# Patient Record
Sex: Female | Born: 1960 | Race: Black or African American | Hispanic: No | Marital: Married | Smoking: Never smoker
Health system: Southern US, Community
[De-identification: ages and names within clinical notes are randomized; demographics above are authoritative.]

## PROBLEM LIST (undated history)

## (undated) DIAGNOSIS — M2550 Pain in unspecified joint: Secondary | ICD-10-CM

## (undated) DIAGNOSIS — I429 Cardiomyopathy, unspecified: Secondary | ICD-10-CM

## (undated) DIAGNOSIS — K5900 Constipation, unspecified: Secondary | ICD-10-CM

## (undated) DIAGNOSIS — T7840XA Allergy, unspecified, initial encounter: Secondary | ICD-10-CM

## (undated) DIAGNOSIS — K219 Gastro-esophageal reflux disease without esophagitis: Secondary | ICD-10-CM

## (undated) DIAGNOSIS — I509 Heart failure, unspecified: Secondary | ICD-10-CM

## (undated) DIAGNOSIS — G8929 Other chronic pain: Secondary | ICD-10-CM

## (undated) DIAGNOSIS — R079 Chest pain, unspecified: Secondary | ICD-10-CM

## (undated) DIAGNOSIS — D649 Anemia, unspecified: Secondary | ICD-10-CM

## (undated) DIAGNOSIS — F419 Anxiety disorder, unspecified: Secondary | ICD-10-CM

## (undated) DIAGNOSIS — C49A0 Gastrointestinal stromal tumor, unspecified site: Secondary | ICD-10-CM

## (undated) DIAGNOSIS — Z86018 Personal history of other benign neoplasm: Secondary | ICD-10-CM

## (undated) DIAGNOSIS — R202 Paresthesia of skin: Secondary | ICD-10-CM

## (undated) DIAGNOSIS — I10 Essential (primary) hypertension: Secondary | ICD-10-CM

## (undated) DIAGNOSIS — M1990 Unspecified osteoarthritis, unspecified site: Secondary | ICD-10-CM

## (undated) DIAGNOSIS — H5200 Hypermetropia, unspecified eye: Secondary | ICD-10-CM

## (undated) DIAGNOSIS — E785 Hyperlipidemia, unspecified: Secondary | ICD-10-CM

## (undated) DIAGNOSIS — M549 Dorsalgia, unspecified: Secondary | ICD-10-CM

## (undated) DIAGNOSIS — G459 Transient cerebral ischemic attack, unspecified: Secondary | ICD-10-CM

## (undated) HISTORY — PX: LIPOMA EXCISION: SHX5283

## (undated) HISTORY — DX: Gastro-esophageal reflux disease without esophagitis: K21.9

## (undated) HISTORY — DX: Chest pain, unspecified: R07.9

## (undated) HISTORY — PX: UTERINE FIBROID SURGERY: SHX826

## (undated) HISTORY — DX: Anemia, unspecified: D64.9

## (undated) HISTORY — DX: Hyperlipidemia, unspecified: E78.5

## (undated) HISTORY — PX: KNEE ARTHROSCOPY: SUR90

## (undated) HISTORY — DX: Pain in unspecified joint: M25.50

## (undated) HISTORY — PX: ESOPHAGOGASTRODUODENOSCOPY: SHX1529

## (undated) HISTORY — DX: Hypermetropia, unspecified eye: H52.00

## (undated) HISTORY — DX: Essential (primary) hypertension: I10

## (undated) HISTORY — DX: Gastrointestinal stromal tumor, unspecified site: C49.A0

## (undated) HISTORY — DX: Allergy, unspecified, initial encounter: T78.40XA

## (undated) HISTORY — DX: Heart failure, unspecified: I50.9

## (undated) HISTORY — DX: Paresthesia of skin: R20.2

## (undated) HISTORY — PX: UPPER GASTROINTESTINAL ENDOSCOPY: SHX188

## (undated) HISTORY — DX: Personal history of other benign neoplasm: Z86.018

## (undated) HISTORY — DX: Constipation, unspecified: K59.00

## (undated) HISTORY — PX: OTHER SURGICAL HISTORY: SHX169

---

## 2011-01-09 ENCOUNTER — Encounter: Payer: Self-pay | Admitting: Medical

## 2011-01-17 ENCOUNTER — Ambulatory Visit (INDEPENDENT_AMBULATORY_CARE_PROVIDER_SITE_OTHER): Payer: BC Managed Care – PPO | Admitting: Medical

## 2011-01-17 ENCOUNTER — Other Ambulatory Visit (HOSPITAL_COMMUNITY)
Admission: RE | Admit: 2011-01-17 | Discharge: 2011-01-17 | Disposition: A | Discharge status: Home / Self Care | Payer: BC Managed Care – PPO | Source: Ambulatory Visit | Attending: Medical | Admitting: Medical

## 2011-01-17 ENCOUNTER — Encounter: Payer: Self-pay | Admitting: Medical

## 2011-01-17 VITALS — BP 120/70 | HR 68 | Temp 97.8°F | Resp 18 | Ht 69.0 in | Wt 213.0 lb

## 2011-01-17 DIAGNOSIS — Z01419 Encounter for gynecological examination (general) (routine) without abnormal findings: Secondary | ICD-10-CM | POA: Insufficient documentation

## 2011-01-17 DIAGNOSIS — Z124 Encounter for screening for malignant neoplasm of cervix: Secondary | ICD-10-CM

## 2011-01-17 DIAGNOSIS — Z1231 Encounter for screening mammogram for malignant neoplasm of breast: Secondary | ICD-10-CM

## 2011-01-17 DIAGNOSIS — M549 Dorsalgia, unspecified: Secondary | ICD-10-CM

## 2011-01-17 DIAGNOSIS — Z23 Encounter for immunization: Secondary | ICD-10-CM

## 2011-01-17 DIAGNOSIS — I10 Essential (primary) hypertension: Secondary | ICD-10-CM | POA: Insufficient documentation

## 2011-01-17 DIAGNOSIS — D649 Anemia, unspecified: Secondary | ICD-10-CM

## 2011-01-17 DIAGNOSIS — Z0000 Encounter for general adult medical examination without abnormal findings: Secondary | ICD-10-CM

## 2011-01-17 LAB — CBC WITH DIFFERENTIAL/PLATELET
Basophils Absolute: 0 10*3/uL (ref 0.0–0.1)
Basophils Relative: 0 % (ref 0–1)
Eosinophils Absolute: 0.2 10*3/uL (ref 0.0–0.7)
Eosinophils Relative: 2 % (ref 0–5)
HCT: 38.9 % (ref 36.0–46.0)
Hemoglobin: 12.5 g/dL (ref 12.0–15.0)
Lymphocytes Relative: 35 % (ref 12–46)
Lymphs Abs: 2.9 10*3/uL (ref 0.7–4.0)
MCH: 27.4 pg (ref 26.0–34.0)
MCHC: 32.1 g/dL (ref 30.0–36.0)
MCV: 85.3 fL (ref 78.0–100.0)
Monocytes Absolute: 0.6 10*3/uL (ref 0.1–1.0)
Monocytes Relative: 7 % (ref 3–12)
Neutro Abs: 4.6 10*3/uL (ref 1.7–7.7)
Neutrophils Relative %: 55 % (ref 43–77)
Platelets: 351 10*3/uL (ref 150–400)
RBC: 4.56 MIL/uL (ref 3.87–5.11)
RDW: 14.3 % (ref 11.5–15.5)
WBC: 8.4 10*3/uL (ref 4.0–10.5)

## 2011-01-17 LAB — POCT URINALYSIS DIPSTICK
Bilirubin, UA: NEGATIVE
Blood, UA: NEGATIVE
Glucose, UA: NEGATIVE
Ketones, UA: NEGATIVE
Leukocytes, UA: NEGATIVE
Nitrite, UA: NEGATIVE
Spec Grav, UA: 1.01
Urobilinogen, UA: NEGATIVE
pH, UA: 7

## 2011-01-17 LAB — FERRITIN: Ferritin: 123 ng/mL (ref 10–291)

## 2011-01-17 LAB — TSH: TSH: 1.891 u[IU]/mL (ref 0.350–4.500)

## 2011-01-17 MED ORDER — NABUMETONE 750 MG PO TABS: 750.0000 mg | ORAL_TABLET | Freq: Two times a day (BID) | ORAL | Status: DC

## 2011-01-17 MED ORDER — OMEPRAZOLE 20 MG PO CPDR: DELAYED_RELEASE_CAPSULE | ORAL | Status: DC

## 2011-01-17 MED ORDER — FERROUS SULFATE 220 (44 FE) MG/5ML PO ELIX: 220.0000 mg | ORAL_SOLUTION | Freq: Every day | ORAL | Status: DC

## 2011-01-17 MED ORDER — NAPROXEN SODIUM 550 MG PO TABS: 550.0000 mg | ORAL_TABLET | Freq: Two times a day (BID) | ORAL | Status: DC

## 2011-01-17 MED ORDER — HYDROCHLOROTHIAZIDE 12.5 MG PO CAPS: 12.5000 mg | ORAL_CAPSULE | Freq: Every day | ORAL | Status: DC

## 2011-01-17 MED ORDER — LORATADINE 10 MG PO TABS: 10.0000 mg | ORAL_TABLET | Freq: Every day | ORAL | Status: DC

## 2011-01-17 NOTE — Progress Notes (Signed)
Addended by: Jac Canavan on: 01/17/2011 02:32 PM   Modules accepted: Orders

## 2011-01-17 NOTE — Patient Instructions (Signed)

## 2011-01-17 NOTE — Progress Notes (Signed)
Subjective:   HPI  Lisa Briggs is a 50 y.o. female who presents for a complete physical.  She is here as a new patient today.   She is due for mammogram, pap smear, and needs medications refilled.  Last medical care was over a year ago when she lived in [place], [place].  She moved here to [place] in 6/11 with her husband to take care of his mother who is ailing.    Her main c/o is back pain.  She notes ongoing left low back pain for months.  At times the pain radiates down in to the left buttock.  Gets shooting pains and tingling all the way down to the foot at times.   Using Relafen on average daily, and uses Naprosyn 1-2 times daily for pain which helps some.  No other aggravating or relieving factors.  No prior imaging or therapy otherwise.   She notes hx/o anemia.  Been on iron therapy for years.   No prior colonoscopy or EGD.    She wears glasses and has seen eye doctor within the year.   Saw dentist recently, had some teeth pulled.  Has f/u planned.   Reviewed their medical, surgical, family, social, medication, and allergy history and updated chart as appropriate.  Past Medical History  Diagnosis Date  . Hypertension   . GERD (gastroesophageal reflux disease)   . Allergy   . Farsightedness     wears glasses, Eye care center  . Anemia     iron therapy for years as of 10/12  . History of uterine fibroid     Past Surgical History  Procedure Date  . Abdominal hysterectomy     partial for uterine fibroids  . Lipoma excision     forehead  . Knee arthroscopy     left    Family History  Problem Relation Age of Onset  . Hypertension Mother   . Arthritis Mother   . GER disease Mother   . Stroke Father   . Alcohol abuse Father   . Cancer Sister     breast cancer dx late 65s; another sister with leukemia  . Hypertension Sister   . Diabetes Brother   . Diabetes Maternal Aunt     History   Social History  . Marital Status: Married    Spouse Name: N/A    Number of  Children: N/A  . Years of Education: N/A   Occupational History  . Location manager    Social History Main Topics  . Smoking status: Never Smoker   . Smokeless tobacco: Never Used  . Alcohol Use: No  . Drug Use: No  . Sexually Active: Not on file   Other Topics Concern  . Not on file   Social History Narrative   Married, 1 child, lives at home.  Location manager.  Active on job, but no other exercise currently.     No Known Allergies   Review of Systems Constitutional: denies fever, chills, sweats, unexpected weight change, anorexia, fatigue Allergy: +occasional congestion, runny nose; denies recent sneezing, itching Dermatology: denies changing moles, rash, lumps, new worrisome lesions ENT: +occasional sinus problems; no runny nose, ear pain, sore throat, hoarseness, tinnitus, hearing loss, epistaxis Cardiology: denies chest pain, palpitations, edema, orthopnea, paroxysmal nocturnal dyspnea Respiratory: denies cough, shortness of breath, dyspnea on exertion, wheezing, hemoptysis Gastroenterology: denies abdominal pain, nausea, vomiting, diarrhea, constipation, blood in stool, changes in bowel movement, dysphagia Hematology: denies bleeding or bruising problems Musculoskeletal: +back pain, left buttock pain,  shooting pains to foot on the left, some left hip pain intermittent; denies joint swelling, neck pain, cramping Ophthalmology: denies vision changes, eye redness, itching, discharge Urology: denies dysuria, difficulty urinating, hematuria, urinary frequency, urgency, incontinence Neurology: +tingling in left leg from back pain; no headache, weakness, numbness, speech abnormality, memory loss, falls, dizziness Psychology: denies depressed mood, agitation, sleep problems     Objective:   Physical Exam  Filed Vitals:   01/17/11 0833  BP: 120/70  Pulse: 68  Temp: 97.8 F (36.6 C)  Resp: 18    General appearance: alert, no distress, WD/WN, black female Skin: few  scattered benign appearing macules, no worrisome lesions HEENT: normocephalic, conjunctiva/corneas normal, sclerae anicteric, PERRLA, EOMi, nares patent, no discharge or erythema, pharynx normal Oral cavity: MMM, tongue normal, teeth in good repair Neck: supple, no lymphadenopathy, no thyromegaly, no masses, normal ROM, no bruits Chest: non tender, normal shape and expansion Heart: RRR, normal S1, S2, no murmurs Lungs: CTA bilaterally, no wheezes, rhonchi, or rales Abdomen: +bs, soft, non tender, non distended, no masses, no hepatomegaly, no splenomegaly, no bruits Back: mild midline lumbar tenderness, pain with back flexion and extension, flexion to 90 degrees, extension to 5 degrees, otherwise non tender, no scoliosis Musculoskeletal: tender along left hip generalized, mild tenderness over left trochanteric bursa, otherwise upper extremities non tender, no obvious deformity, normal ROM throughout, lower extremities non tender, no obvious deformity, normal ROM throughout Extremities: no edema, no cyanosis, no clubbing Pulses: 2+ symmetric, upper and lower extremities, normal cap refill Neurological: +SLR on left at 20 degrees, otherwise alert, oriented x 3, CN2-12 intact, strength normal upper extremities and lower extremities, sensation normal throughout, DTRs 2+ throughout, no cerebellar signs, gait normal Psychiatric: normal affect, behavior normal, pleasant  Breasts: nontender, no nodules, no mass, no lymphadenopathy, no skin changes. Gyn: normal external genitalia, no discharge, cervix seems somewhat closed, but no lesions otherwise, no adnexal mass or tenderness, swabs taken, anus normal appearing, exam chaperoned by nurse  Assessment :    Encounter Diagnoses  Name Primary?  . Routine general medical examination at a health care facility Yes  . Essential hypertension, benign   . Anemia   . Back pain   . Visit for screening mammogram   . Screening for cervical cancer   . Need for  tetanus booster     Plan:    Physical exam - discussed healthy lifestyle, diet, exercise, preventative care, vaccinations, and addressed their concerns.  Labs today, handout given.   HTN - controlled on current medication.  Refills today.  Anemia - labs today, c/t iron.  Back pain - pending labs, consider PT, and baseline xrays.  Will send for screening mammogram and pap smear sent.   Tdap and VIS given today.

## 2011-01-18 LAB — COMPREHENSIVE METABOLIC PANEL
ALT: 15 U/L (ref 0–35)
AST: 15 U/L (ref 0–37)
Albumin: 4 g/dL (ref 3.5–5.2)
Alkaline Phosphatase: 86 U/L (ref 39–117)
BUN: 18 mg/dL (ref 6–23)
CO2: 31 mEq/L (ref 19–32)
Calcium: 9.3 mg/dL (ref 8.4–10.5)
Chloride: 102 mEq/L (ref 96–112)
Creat: 0.64 mg/dL (ref 0.50–1.10)
Glucose, Bld: 80 mg/dL (ref 70–99)
Potassium: 3.8 mEq/L (ref 3.5–5.3)
Sodium: 141 mEq/L (ref 135–145)
Total Bilirubin: 0.3 mg/dL (ref 0.3–1.2)
Total Protein: 7 g/dL (ref 6.0–8.3)

## 2011-01-18 LAB — LIPID PANEL
Cholesterol: 173 mg/dL (ref 0–200)
HDL: 61 mg/dL (ref >39)
LDL Cholesterol: 101 mg/dL — ABNORMAL HIGH (ref 0–99)
Total CHOL/HDL Ratio: 2.8 Ratio
Triglycerides: 55 mg/dL (ref <150)
VLDL: 11 mg/dL (ref 0–40)

## 2011-01-18 LAB — IBC PANEL
%SAT: 17 % — ABNORMAL LOW (ref 20–55)
TIBC: 266 ug/dL (ref 250–470)
UIBC: 221 ug/dL (ref 125–400)

## 2011-01-18 LAB — IRON: Iron: 45 ug/dL (ref 42–145)

## 2011-01-23 NOTE — Progress Notes (Signed)
Patient was notified of pap results. CLS

## 2011-01-26 ENCOUNTER — Other Ambulatory Visit: Payer: Self-pay | Admitting: Family Medicine

## 2011-01-27 ENCOUNTER — Other Ambulatory Visit: Payer: Self-pay | Admitting: Medical

## 2011-01-27 ENCOUNTER — Telehealth: Payer: Self-pay | Admitting: Medical

## 2011-01-27 NOTE — Telephone Encounter (Signed)
Lisa Briggs, this patient states that the BP medication that was sent into her pharmacy was wrong and that she need another rx as well. CLS

## 2011-01-27 NOTE — Telephone Encounter (Signed)
i refilled what was listed by nurse.  Pls call pt and verify what medication, dose, and frequency she is taking so I can send the appropriate medication.

## 2011-02-09 ENCOUNTER — Ambulatory Visit
Admission: RE | Admit: 2011-02-09 | Discharge: 2011-02-09 | Disposition: A | Discharge status: Home / Self Care | Payer: BC Managed Care – PPO | Source: Ambulatory Visit | Attending: Medical | Admitting: Medical

## 2011-02-09 DIAGNOSIS — Z1231 Encounter for screening mammogram for malignant neoplasm of breast: Secondary | ICD-10-CM

## 2011-02-28 ENCOUNTER — Telehealth: Payer: Self-pay | Admitting: Medical

## 2011-02-28 ENCOUNTER — Other Ambulatory Visit: Payer: Self-pay | Admitting: Medical

## 2011-02-28 MED ORDER — ONDANSETRON HCL 4 MG PO TABS: 4.0000 mg | ORAL_TABLET | Freq: Every day | ORAL | Status: DC | PRN

## 2011-02-28 NOTE — Telephone Encounter (Signed)
Make sure she is taking her Omeprazole.  If this isn't helping, then need to recheck on this.  I will send some Zofran for nausea.

## 2011-03-02 NOTE — Telephone Encounter (Signed)
PATIENT WAS NOTIFIED AND SHE STATES THE MEDICATION IS NOT WORKING ANYMORE. SHE SCHEDULED AN APPT. FOR Friday. CLS  DID YOU SEND HER ZOFRAN TO THE PHARMACY. CLS

## 2011-03-03 ENCOUNTER — Ambulatory Visit: Payer: BC Managed Care – PPO | Admitting: Medical

## 2011-03-06 ENCOUNTER — Ambulatory Visit (INDEPENDENT_AMBULATORY_CARE_PROVIDER_SITE_OTHER): Payer: BC Managed Care – PPO | Admitting: Medical

## 2011-03-06 ENCOUNTER — Encounter: Payer: Self-pay | Admitting: Medical

## 2011-03-06 DIAGNOSIS — K5900 Constipation, unspecified: Secondary | ICD-10-CM | POA: Insufficient documentation

## 2011-03-06 DIAGNOSIS — R079 Chest pain, unspecified: Secondary | ICD-10-CM | POA: Insufficient documentation

## 2011-03-06 DIAGNOSIS — R109 Unspecified abdominal pain: Secondary | ICD-10-CM | POA: Insufficient documentation

## 2011-03-06 DIAGNOSIS — R001 Bradycardia, unspecified: Secondary | ICD-10-CM | POA: Insufficient documentation

## 2011-03-06 DIAGNOSIS — R110 Nausea: Secondary | ICD-10-CM

## 2011-03-06 DIAGNOSIS — I498 Other specified cardiac arrhythmias: Secondary | ICD-10-CM

## 2011-03-06 LAB — POCT URINALYSIS DIPSTICK
Bilirubin, UA: NEGATIVE
Glucose, UA: NEGATIVE
Ketones, UA: NEGATIVE
Leukocytes, UA: NEGATIVE
Nitrite, UA: NEGATIVE
Protein, UA: NEGATIVE
Spec Grav, UA: 1.025
Urobilinogen, UA: NEGATIVE
pH, UA: 7

## 2011-03-06 MED ORDER — POLYETHYLENE GLYCOL 3350 17 GM/SCOOP PO POWD: 17.0000 g | Freq: Every day | ORAL | Status: AC

## 2011-03-06 MED ORDER — ONDANSETRON HCL 4 MG PO TABS: 4.0000 mg | ORAL_TABLET | Freq: Every day | ORAL | Status: DC | PRN

## 2011-03-06 NOTE — Progress Notes (Signed)
Subjective:   HPI  Lisa Briggs is a 50 y.o. female who presents with some nausea, abdominal pain and chest pain.  This past weekend started getting stomach pain and vomiting.  Symptoms began a week ago.  Been having nausea all week.  Had some belly pain in middle of abdomen.  The pains are sharp.  Decreased appetite.  Son has had nasal drainage, and she notes that her right nostril stays congested and draining.   She notes some chest discomfort intermittent in the last week that lasts for 5 minutes or less.   She was at work when one episode happened, on another occasion though it happened at home while sitting on a sofa.  The chest pain was aggravated by nausea some.  Denies sweats, edema, worsening fatigue.  She does have hx/o heart disease in family including sisters.  She denies prior cardiac evaluation.  Of note, she is not exercising much.  No other aggravating or relieving factors.    No other c/o.  The following portions of the patient's history were reviewed and updated as appropriate: allergies, current medications, past family history, past medical history, past social history, past surgical history and problem list.  Review of Systems Constitutional: -fever, -chills, -sweats, +unexpected -weight change,-fatigue ENT: +runny nose, -ear pain, -sore throat Cardiology:  +chest pain, -palpitations, -edema Respiratory: -cough, -shortness of breath, -wheezing Gastroenterology: -abdominal pain, -nausea, -vomiting, -diarrhea, +constipation Hematology: -bleeding or bruising problems Musculoskeletal: -arthralgias, -myalgias, -joint swelling, +back pain Ophthalmology: +vision changes Urology: -dysuria, -difficulty urinating, -hematuria, -urinary frequency, -urgency Neurology: -headache, -weakness, +tingling, -numbness   Adult ECG Report  Indication: chest pain  Rate: 51 bpm  Rhythm: sinus bradycardia  QRS Axis: 43 degrees  PR Interval:   QRS Duration: 96 ms    QTc: 449 ms  Conduction Disturbances: none  Other Abnormalities: T wave inversions V1, V2  Patient's cardiac risk factors are: family history of premature cardiovascular disease, hypertension and sedentary lifestyle.  EKG comparison: none  Narrative Interpretation: sinus bradycardia, will refer to cardiology for evaluation of chest.     Objective:   Physical Exam  Filed Vitals:   03/06/11 0827  BP: 112/70  Pulse: 62  Temp: 98.4 F (36.9 C)  Resp: 16    General appearance: alert, no distress, WD/WN HEENT: normocephalic, sclerae anicteric, TMs pearly, nares patent, no discharge or erythema, pharynx normal Oral cavity: MMM, no lesions Neck: supple, no lymphadenopathy, no thyromegaly, no masses, no bruits Heart: RRR, normal S1, S2, no murmurs Lungs: CTA bilaterally, no wheezes, rhonchi, or rales Abdomen: +bs, soft, non tender, non distended, no masses, no hepatomegaly, no splenomegaly Back : no CVA tenderness Pulses: 2+ symmetric, upper and lower extremities, normal cap refill   Assessment and Plan :    Encounter Diagnoses  Name Primary?  . Abdominal pain Yes  . Chest pain   . Nausea   . Constipation   . Bradycardia    Abdominal pain and nausea - nonspecific but could be related to constipation.   I reviewed her recent labs from her physical with me.   Begin trial of Miralax daily, rest, hydrate well with water.  Begin Zofran prn for nausea.  Given EKG findings today of bradycardia and some T wave inversions, and  Given her risk factors - family hx/o heart disease, personal hx/o HTN, and current symptoms, will refer to cardiology for evaluation.  Follow-up prn or worsening symptoms.

## 2011-03-20 ENCOUNTER — Telehealth: Payer: Self-pay | Admitting: Internal Medicine

## 2011-03-20 MED ORDER — HYDROCHLOROTHIAZIDE 12.5 MG PO CAPS: 12.5000 mg | ORAL_CAPSULE | Freq: Every day | ORAL | Status: DC

## 2011-03-20 NOTE — Telephone Encounter (Signed)
Med sent in.

## 2011-03-30 ENCOUNTER — Telehealth: Payer: Self-pay | Admitting: Family Medicine

## 2011-03-30 MED ORDER — HYDROCHLOROTHIAZIDE 25 MG PO TABS: 25.0000 mg | ORAL_TABLET | Freq: Every day | ORAL | Status: DC

## 2011-03-30 NOTE — Telephone Encounter (Signed)
Refilled med with 25 mg hctz

## 2011-03-30 NOTE — Telephone Encounter (Signed)
Pt called and said she got the wrong refill she wanted the HCTZ 25 mg to [facility] [address].

## 2011-04-04 HISTORY — PX: ESOPHAGOGASTRODUODENOSCOPY: SHX1529

## 2011-04-05 DIAGNOSIS — R079 Chest pain, unspecified: Secondary | ICD-10-CM

## 2011-04-05 HISTORY — DX: Chest pain, unspecified: R07.9

## 2011-04-06 ENCOUNTER — Telehealth: Payer: Self-pay | Admitting: Internal Medicine

## 2011-04-06 NOTE — Telephone Encounter (Signed)
This is your patient.

## 2011-04-07 ENCOUNTER — Other Ambulatory Visit: Payer: Self-pay | Admitting: Medical

## 2011-04-07 DIAGNOSIS — R110 Nausea: Secondary | ICD-10-CM

## 2011-04-07 MED ORDER — HYDROCHLOROTHIAZIDE 25 MG PO TABS: 25.0000 mg | ORAL_TABLET | Freq: Every day | ORAL | Status: DC

## 2011-04-07 MED ORDER — LORATADINE 10 MG PO TABS: 10.0000 mg | ORAL_TABLET | Freq: Every day | ORAL | Status: DC

## 2011-04-07 MED ORDER — NAPROXEN SODIUM 550 MG PO TABS: 550.0000 mg | ORAL_TABLET | Freq: Two times a day (BID) | ORAL | Status: DC

## 2011-04-07 NOTE — Telephone Encounter (Signed)
I refilled Hydrochlorothiazide and Loratidine.  I didn't refill the zofran, nabumetone and naproxen though.  1) she shouldn't be taking Zofran regularly.  And 2), she shouldn't be on both Nabumetone and Naproxen at the same time.    If still nauseated on a routine basis, then recheck.  Does she feel the need to continue to be on an arthritis/pain pill?  If so, which does she feels works better, Nabumetone or Naproxen?

## 2011-04-07 NOTE — Telephone Encounter (Signed)
PATIENTS STATES THAT SHE IS ONLY TAKING NAPROXEN. SHE SAID THAT SHE IS USING THE ZOFRAN BECAUSE SHE GETS NAUSED WHEN HER SINUS DRAIN. SHE WANTS A REFILL ON THE NAPROXEN. CLS

## 2011-04-10 ENCOUNTER — Telehealth: Payer: Self-pay | Admitting: Family Medicine

## 2011-04-10 NOTE — Telephone Encounter (Signed)
Message copied by Janeice Robinson on Mon Apr 10, 2011  2:29 PM ------      Message from: [place], [place] S      Created: Fri Apr 07, 2011  9:26 PM       pls call [facility] and verify they got my refill orders for hydrochlorothiazide, loratadine, and naproxen.  Address/contact info in E chart different than paper request.

## 2011-04-10 NOTE — Telephone Encounter (Signed)
SHANE I CALLED [facility] AND THEY DID RECEIVE HER MEDICATIONS AND THEY WILL BE SHIP OUT TO HER IN THE NEXT DAY OR SO. CLS

## 2011-05-08 ENCOUNTER — Other Ambulatory Visit: Payer: Self-pay | Admitting: Medical

## 2011-05-08 ENCOUNTER — Telehealth: Payer: Self-pay | Admitting: Internal Medicine

## 2011-05-08 MED ORDER — OMEPRAZOLE 20 MG PO CPDR: DELAYED_RELEASE_CAPSULE | ORAL | Status: DC

## 2011-05-08 MED ORDER — HYDROCHLOROTHIAZIDE 25 MG PO TABS: 25.0000 mg | ORAL_TABLET | Freq: Every day | ORAL | Status: DC

## 2011-05-08 MED ORDER — FERROUS SULFATE 220 (44 FE) MG/5ML PO ELIX: 220.0000 mg | ORAL_SOLUTION | Freq: Every day | ORAL | Status: DC

## 2011-05-08 NOTE — Telephone Encounter (Signed)
RX REFILLS WAS S ENT TO [facility]. CLS

## 2011-05-08 NOTE — Telephone Encounter (Signed)
error 

## 2011-05-10 ENCOUNTER — Telehealth: Payer: Self-pay | Admitting: Family Medicine

## 2011-05-11 ENCOUNTER — Other Ambulatory Visit: Payer: Self-pay | Admitting: Medical

## 2011-05-11 MED ORDER — POLYETHYLENE GLYCOL 3350 17 GM/SCOOP PO POWD: 17.0000 g | Freq: Every day | ORAL | Status: AC

## 2011-05-11 NOTE — Telephone Encounter (Signed)
There were discrepancies last time I had refill request. Pls call and verify ALL medications and update chart.  1) we refilled Naproxen recently and advised to stop the Nabumetone. 2) chart shows Prilosec.  So is she on Prilosec or Protonix? 3) Pls call out 90 day supply of Polyethylene Glycol/Miralx to pharmacy, 1 cap full daily.  The electronic order doesn't let you choose 90 day so call this one in.

## 2011-05-12 ENCOUNTER — Other Ambulatory Visit: Payer: Self-pay | Admitting: Medical

## 2011-05-12 MED ORDER — OMEPRAZOLE 20 MG PO CPDR: DELAYED_RELEASE_CAPSULE | ORAL | Status: DC

## 2011-05-12 NOTE — Telephone Encounter (Signed)
Patient states that she takes the nabumetone as needed. She said that Dr. Donnie Aho said she can take Prilosec and proto nix but not at the same time. CLS

## 2011-05-16 ENCOUNTER — Other Ambulatory Visit: Payer: Self-pay | Admitting: Medical

## 2011-05-16 ENCOUNTER — Telehealth: Payer: Self-pay | Admitting: Medical

## 2011-05-16 MED ORDER — HYDROCHLOROTHIAZIDE 25 MG PO TABS: 25.0000 mg | ORAL_TABLET | Freq: Every day | ORAL | Status: DC

## 2011-05-16 NOTE — Telephone Encounter (Signed)
ALSO LOOKS LIKE WE HAVE REFILLED A LIQUID IRON AND SHE STATES SHE TAKES AN IRON PILL

## 2011-05-17 NOTE — Telephone Encounter (Signed)
SHANE I SPOKE WITH THE [facility] AND THEY SAID POLYETHYLENE GLYCOL/MIRALX  NOR IS FERROUS SULFATE ARE NOT COVERED UNDER HER PLAN BUT HEY DO HAVE THE RX. HER HCTZ THEY HAVE THE RX BUT TO EARLY TO FILL SHE WILL GET THIS ON 06/13/11. THE NAPROXEN SODIUM FOR A 90 DAY SUPPLY WAS SENT OUT ON 04/21/11. OMEPRAZOLE WAS SENT ALREADY. THEY SAID THEY NEED A NEW RX FOR THE NABUMETONE 750MG  NOR DO THEY A HAVE A RX REFILL FOR PROTONIX 40MG . PATIENT SAID SHE ONLY USES THE NABUMETONE AS NEEDED. CLS

## 2011-05-17 NOTE — Telephone Encounter (Signed)
FYI - There have been a lot of back and forth calls about this patient's medications recently.  I recommend she come in for med check/med review.  Nevertheless,   Pls call pt about the following: We called [facility] to sort through the issues as we keep getting refill requests and confusion.    Here is what we know: 1) [facility] did receive refills on Miralax, iron, HCTZ, Naproxen, and Omeprazole.  Thus, we sent them as requested. 2) [facility] said her insurance will not cover Miralax/polyethylene glycol as it is available OTC.   3) [facility] said her insurance won't cover the ferrous iron liquid.  If she was on tablets before, pls verify dose - 325 mg BID?  I'll be glad to resubmit this. 4) [facility] said her HCTZ/hydrochlorothiazide is not ready to refill until 06/13/11.  They have the script on file and ready though.  5) As I mentioned before, I don't think it is appropriate for her to be on 2 NSAIDs - Naproxen and Nabumetone simultaneously.  Thus, I am not going to refill Nabumetone.   She can return and we can discuss which works better for her though.  6) likewise, I don't think its aappropriatefor her to be on 2 PPIs ssimultaneously Protonix and OOmeprazole. She has refills of Omeprazole  I am not going to refill Protonix.    Thus, she is due for med check/recheck between now and next month or so.   But if desired, can come in to discuss.   Please let her know that we have tried to accommodate these requests, but this is the info i have at this time.

## 2011-05-17 NOTE — Telephone Encounter (Signed)
Done

## 2011-05-17 NOTE — Telephone Encounter (Signed)
pls call [facility] and verify what meds were sent, when and quantity.  I will need to verify this before i do anything else.

## 2011-05-23 ENCOUNTER — Other Ambulatory Visit: Payer: Self-pay | Admitting: Medical

## 2011-05-23 ENCOUNTER — Ambulatory Visit: Payer: BC Managed Care – PPO | Attending: Family Medicine | Admitting: Rehabilitation

## 2011-05-23 DIAGNOSIS — M2550 Pain in unspecified joint: Secondary | ICD-10-CM | POA: Insufficient documentation

## 2011-05-23 DIAGNOSIS — M2560 Stiffness of unspecified joint, not elsewhere classified: Secondary | ICD-10-CM | POA: Insufficient documentation

## 2011-05-23 DIAGNOSIS — IMO0001 Reserved for inherently not codable concepts without codable children: Secondary | ICD-10-CM | POA: Insufficient documentation

## 2011-05-23 MED ORDER — POLYETHYLENE GLYCOL 3350 17 GM/SCOOP PO POWD: 17.0000 g | Freq: Every day | ORAL | Status: DC

## 2011-05-23 NOTE — Telephone Encounter (Signed)
I WENT OVER EVERYTHING WITH THIS PATIENT ABOUT HER MEDICATIONS. SHE STATES THAT HER FERROUS IRON IS 325 MG PILL FORM SO IF YOU COULD SEND THAT TO [facility]. SHE ALSO ASK IF YOU COULD SEND THE MIRLAX/POLOEYTHLINE GLYCO POWDER TO [facility] THAT IS IN HER CHART. I WENT BACK OVER THE LIST OF MEDICATIONS AGAIN BEFORE GETTING OF THE PHONE NAPROXEN, PROTONIX, HCTZ, IRON PILL AND THE MIRALAX BY RX. SHE AGREED THAT THESE ARE HER MEDICATIONS. CLS

## 2011-05-23 NOTE — Telephone Encounter (Signed)
i sent the miralax to [facility] [address].  [facility]

## 2011-05-25 ENCOUNTER — Ambulatory Visit: Payer: BC Managed Care – PPO | Admitting: Rehabilitation

## 2011-05-31 ENCOUNTER — Ambulatory Visit: Payer: BC Managed Care – PPO | Admitting: Physical Therapy

## 2011-06-02 ENCOUNTER — Encounter: Payer: BC Managed Care – PPO | Admitting: Rehabilitation

## 2011-06-05 ENCOUNTER — Encounter: Payer: BC Managed Care – PPO | Admitting: Rehabilitation

## 2011-06-06 ENCOUNTER — Ambulatory Visit: Payer: BC Managed Care – PPO | Attending: Family Medicine | Admitting: Rehabilitation

## 2011-06-06 ENCOUNTER — Encounter: Payer: BC Managed Care – PPO | Admitting: Rehabilitation

## 2011-06-06 DIAGNOSIS — M2560 Stiffness of unspecified joint, not elsewhere classified: Secondary | ICD-10-CM | POA: Insufficient documentation

## 2011-06-06 DIAGNOSIS — IMO0001 Reserved for inherently not codable concepts without codable children: Secondary | ICD-10-CM | POA: Insufficient documentation

## 2011-06-06 DIAGNOSIS — M2550 Pain in unspecified joint: Secondary | ICD-10-CM | POA: Insufficient documentation

## 2011-06-07 ENCOUNTER — Encounter: Payer: BC Managed Care – PPO | Admitting: Rehabilitation

## 2011-06-07 ENCOUNTER — Ambulatory Visit: Payer: BC Managed Care – PPO | Admitting: Rehabilitation

## 2011-06-12 ENCOUNTER — Encounter: Payer: BC Managed Care – PPO | Admitting: Rehabilitation

## 2011-06-14 ENCOUNTER — Ambulatory Visit: Payer: BC Managed Care – PPO | Admitting: Physical Therapy

## 2011-06-19 ENCOUNTER — Encounter: Payer: BC Managed Care – PPO | Admitting: Rehabilitation

## 2011-06-21 ENCOUNTER — Encounter: Payer: BC Managed Care – PPO | Admitting: Rehabilitation

## 2011-06-26 ENCOUNTER — Encounter: Payer: BC Managed Care – PPO | Admitting: Physical Therapy

## 2011-06-27 ENCOUNTER — Ambulatory Visit: Payer: BC Managed Care – PPO | Admitting: Physical Therapy

## 2011-06-28 ENCOUNTER — Encounter: Payer: BC Managed Care – PPO | Admitting: Physical Therapy

## 2011-06-29 ENCOUNTER — Encounter: Payer: BC Managed Care – PPO | Admitting: Physical Therapy

## 2011-06-30 ENCOUNTER — Encounter: Payer: BC Managed Care – PPO | Admitting: Physical Therapy

## 2011-07-03 ENCOUNTER — Encounter: Payer: Self-pay | Admitting: Medical

## 2011-07-03 ENCOUNTER — Ambulatory Visit (INDEPENDENT_AMBULATORY_CARE_PROVIDER_SITE_OTHER): Payer: BC Managed Care – PPO | Admitting: Medical

## 2011-07-03 VITALS — BP 110/70 | HR 80 | Temp 97.7°F | Resp 14 | Wt 217.0 lb

## 2011-07-03 DIAGNOSIS — J329 Chronic sinusitis, unspecified: Secondary | ICD-10-CM

## 2011-07-03 DIAGNOSIS — D509 Iron deficiency anemia, unspecified: Secondary | ICD-10-CM

## 2011-07-03 MED ORDER — AMOXICILLIN 875 MG PO TABS: 875.0000 mg | ORAL_TABLET | Freq: Two times a day (BID) | ORAL | Status: AC

## 2011-07-03 MED ORDER — FERROUS GLUCONATE 325 (36 FE) MG PO TABS: 1.0000 | ORAL_TABLET | Freq: Two times a day (BID) | ORAL | Status: DC

## 2011-07-03 MED ORDER — FLUTICASONE PROPIONATE 50 MCG/ACT NA SUSP: NASAL | Status: DC

## 2011-07-03 NOTE — Progress Notes (Signed)
Subjective:  Lisa Briggs is a 51 y.o. female who presents for evaluation of sinus pressure x2+ weeks.  She reports sinus pressure, colored nasal drainage, head cold, sore nose, some cough with phlegm, has had low-grade fevers off-and-on during this time, and can hear the ocean in her ears.  She has been using some cough drops, but no other aggravating or relieving factors.  She also needs a refill on her iron.  The insurance would not pay for iron from [facility], so she needs the iron sent to [facility].  Uses ferrous gluconate 325 mg tablets.  She has thus been out of her iron for a while now.  She notes history of long-term iron therapy for iron deficiency anemia.  No other c/o.  Past Medical History  Diagnosis Date  . Hypertension   . GERD (gastroesophageal reflux disease)   . Allergy   . Farsightedness     wears glasses, Eye care center  . Anemia     iron therapy for years as of 10/12  . History of uterine fibroid    ROS as noted in HPI   Objective:   Filed Vitals:   07/03/11 1147  BP: 110/70  Pulse: 80  Temp: 97.7 F (36.5 C)  Resp: 14    General appearance: Alert, WD/WN, no distress                             Skin: warm, no rash                           Head: +mild sinus tenderness                            Eyes: conjunctiva normal, corneas clear, PERRLA                            Ears: pearly TMs, external ear canals normal                          Nose: septum midline, turbinates swollen, with erythema and clear discharge             Mouth/throat: MMM, tongue normal, mild pharyngeal erythema                           Neck: supple, no adenopathy, no thyromegaly, nontender                          Heart: RRR, normal S1, S2, no murmurs                         Lungs: CTA bilaterally, no wheezes, rales, or rhonchi      Assessment and Plan:   Encounter Diagnoses  Name Primary?  . Sinusitis Yes  . Iron deficiency anemia    Sinusitis - prescription given for Amoxicillin.   Can use OTC Benadryl QHS for congestion.  Tylenol OTC for fever and malaise.  Discussed symptomatic relief, nasal saline, and call or return if worse or not improving in 2-3 days.     I deficiency anemia-restart iron, prescription sent, recheck in 3 months

## 2011-12-27 ENCOUNTER — Telehealth: Payer: Self-pay | Admitting: Medical

## 2011-12-27 MED ORDER — HYDROCHLOROTHIAZIDE 25 MG PO TABS: 25.0000 mg | ORAL_TABLET | Freq: Every day | ORAL | Status: DC

## 2011-12-27 NOTE — Telephone Encounter (Signed)
I SENT THE PATIENTS REFILL IN FOR ONLY 90 DAY BECAUSE SHE NEEDS A OFFICE VISIT. CLS

## 2012-01-08 ENCOUNTER — Other Ambulatory Visit: Payer: Self-pay | Admitting: Medical

## 2012-01-08 ENCOUNTER — Encounter: Payer: Self-pay | Admitting: Internal Medicine

## 2012-01-08 DIAGNOSIS — Z1231 Encounter for screening mammogram for malignant neoplasm of breast: Secondary | ICD-10-CM

## 2012-01-16 ENCOUNTER — Encounter: Payer: Self-pay | Admitting: Medical

## 2012-01-16 ENCOUNTER — Telehealth: Payer: Self-pay | Admitting: Family Medicine

## 2012-01-16 ENCOUNTER — Other Ambulatory Visit (HOSPITAL_COMMUNITY)
Admission: RE | Admit: 2012-01-16 | Discharge: 2012-01-16 | Disposition: A | Discharge status: Home / Self Care | Payer: BC Managed Care – PPO | Source: Ambulatory Visit | Attending: Medical | Admitting: Medical

## 2012-01-16 ENCOUNTER — Ambulatory Visit (INDEPENDENT_AMBULATORY_CARE_PROVIDER_SITE_OTHER): Payer: BC Managed Care – PPO | Admitting: Medical

## 2012-01-16 VITALS — BP 120/80 | HR 76 | Temp 98.3°F | Resp 16 | Ht 68.5 in | Wt 208.0 lb

## 2012-01-16 DIAGNOSIS — M2560 Stiffness of unspecified joint, not elsewhere classified: Secondary | ICD-10-CM

## 2012-01-16 DIAGNOSIS — Z01419 Encounter for gynecological examination (general) (routine) without abnormal findings: Secondary | ICD-10-CM | POA: Insufficient documentation

## 2012-01-16 DIAGNOSIS — R87810 Cervical high risk human papillomavirus (HPV) DNA test positive: Secondary | ICD-10-CM | POA: Insufficient documentation

## 2012-01-16 DIAGNOSIS — E669 Obesity, unspecified: Secondary | ICD-10-CM

## 2012-01-16 DIAGNOSIS — Z1151 Encounter for screening for human papillomavirus (HPV): Secondary | ICD-10-CM | POA: Insufficient documentation

## 2012-01-16 DIAGNOSIS — N949 Unspecified condition associated with female genital organs and menstrual cycle: Secondary | ICD-10-CM

## 2012-01-16 DIAGNOSIS — D509 Iron deficiency anemia, unspecified: Secondary | ICD-10-CM

## 2012-01-16 DIAGNOSIS — Z124 Encounter for screening for malignant neoplasm of cervix: Secondary | ICD-10-CM

## 2012-01-16 DIAGNOSIS — R102 Pelvic and perineal pain: Secondary | ICD-10-CM

## 2012-01-16 DIAGNOSIS — Z1239 Encounter for other screening for malignant neoplasm of breast: Secondary | ICD-10-CM

## 2012-01-16 DIAGNOSIS — Z0000 Encounter for general adult medical examination without abnormal findings: Secondary | ICD-10-CM

## 2012-01-16 DIAGNOSIS — M2550 Pain in unspecified joint: Secondary | ICD-10-CM

## 2012-01-16 DIAGNOSIS — I10 Essential (primary) hypertension: Secondary | ICD-10-CM

## 2012-01-16 DIAGNOSIS — M2540 Effusion, unspecified joint: Secondary | ICD-10-CM

## 2012-01-16 DIAGNOSIS — R110 Nausea: Secondary | ICD-10-CM

## 2012-01-16 DIAGNOSIS — R109 Unspecified abdominal pain: Secondary | ICD-10-CM

## 2012-01-16 LAB — CBC WITH DIFFERENTIAL/PLATELET
Basophils Absolute: 0 10*3/uL (ref 0.0–0.1)
Basophils Relative: 0 % (ref 0–1)
Eosinophils Absolute: 0.2 10*3/uL (ref 0.0–0.7)
Eosinophils Relative: 2 % (ref 0–5)
HCT: 36.2 % (ref 36.0–46.0)
Hemoglobin: 11.6 g/dL — ABNORMAL LOW (ref 12.0–15.0)
Lymphocytes Relative: 34 % (ref 12–46)
Lymphs Abs: 2.7 10*3/uL (ref 0.7–4.0)
MCH: 26.8 pg (ref 26.0–34.0)
MCHC: 32 g/dL (ref 30.0–36.0)
MCV: 83.6 fL (ref 78.0–100.0)
Monocytes Absolute: 0.6 10*3/uL (ref 0.1–1.0)
Monocytes Relative: 8 % (ref 3–12)
Neutro Abs: 4.4 10*3/uL (ref 1.7–7.7)
Neutrophils Relative %: 56 % (ref 43–77)
Platelets: 377 10*3/uL (ref 150–400)
RBC: 4.33 MIL/uL (ref 3.87–5.11)
RDW: 13.7 % (ref 11.5–15.5)
WBC: 7.9 10*3/uL (ref 4.0–10.5)

## 2012-01-16 LAB — LIPID PANEL
Cholesterol: 169 mg/dL (ref 0–200)
HDL: 62 mg/dL (ref >39)
LDL Cholesterol: 98 mg/dL (ref 0–99)
Total CHOL/HDL Ratio: 2.7 Ratio
Triglycerides: 43 mg/dL (ref <150)
VLDL: 9 mg/dL (ref 0–40)

## 2012-01-16 LAB — COMPREHENSIVE METABOLIC PANEL
ALT: 11 U/L (ref 0–35)
AST: 14 U/L (ref 0–37)
Albumin: 3.8 g/dL (ref 3.5–5.2)
Alkaline Phosphatase: 96 U/L (ref 39–117)
BUN: 10 mg/dL (ref 6–23)
CO2: 30 mEq/L (ref 19–32)
Calcium: 9.7 mg/dL (ref 8.4–10.5)
Chloride: 102 mEq/L (ref 96–112)
Creat: 0.55 mg/dL (ref 0.50–1.10)
Glucose, Bld: 83 mg/dL (ref 70–99)
Potassium: 3.6 mEq/L (ref 3.5–5.3)
Sodium: 140 mEq/L (ref 135–145)
Total Bilirubin: 0.7 mg/dL (ref 0.3–1.2)
Total Protein: 6.7 g/dL (ref 6.0–8.3)

## 2012-01-16 LAB — TSH: TSH: 1.589 u[IU]/mL (ref 0.350–4.500)

## 2012-01-16 NOTE — Telephone Encounter (Signed)
PATIENT IS AWARE OF HER APPOINTMENT AT [facility] ON 01/22/12 @ 800 AM FOR HER ABDOMEN AND PELVIS ULTRASOUND. CLS

## 2012-01-16 NOTE — Progress Notes (Signed)
Subjective:   HPI  Lisa Briggs is a 51 y.o. female who presents for a complete physical.  She has multiple c/o today.  She notes joint pains in both hips, several fingers, knees, and back.  This is chronic, but been giving her a hard time of late.  Gets morning stiffness that can last for 15 minutes to an hour.  Tries stretches and some exercises every morning which helps some.   However, still gets stiffness and pain in low back, decreased ROM in general.   Gets swelling in fingers and knees. Uses Naproxen for joint pains.   She reports hx/o hysterectomy, however, she notes still getting periods though.  She thinks the procedure was for fibroids.  She gets occasional pelvic discomfort.   She reports chronic nausea and constipation.   Gets nauseated somewhat frequently.   Uses Zofran on a somewhat regular basis.  Attributes this to either GERD or constipation.  Takes medication for GERD (Prilosec), but this still flares up somewhat regularly.   She began miralax at my advice and this has made a big improvement in constipation.  Hx/o anemia, not taking iron regularly compared to a year ago.  HTN - compliant with her fluid pill.  Hx/o allergies, but not taking nasal spray every day.  Uses the Claritin regularly  Preventative care: No prior colonoscopy Pap last year here.  She notes prior pap smears through health dept in [place], [place] Mammogram coming up 02/12/12. Flu shot at work recently.  tdap updated last year. Last eye exam 01/2012 Sees dentist yearly Works 12 hour shifts ,but tries and gets some exercise outside of work. Diet - eats typically twice daily, but does get some snacks that may include chips and ice cream  Reviewed their medical, surgical, family, social, medication, and allergy history and updated chart as appropriate.  No Known Allergies  Current Outpatient Prescriptions on File Prior to Visit  Medication Sig Dispense Refill  . hydrochlorothiazide  (HYDRODIURIL) 25 MG tablet Take 1 tablet (25 mg total) by mouth daily. PATIENT NEEDS TO SCHEDULE AN OFFICE VISIT TO FOLLOW UP ON HER BP BEFORE HER MEDICATIONS RUN OUT.  90 tablet  0  . loratadine (CLARITIN) 10 MG tablet Take 1 tablet (10 mg total) by mouth daily.  90 tablet  1  . naproxen sodium (ANAPROX) 550 MG tablet Take 1 tablet (550 mg total) by mouth 2 (two) times daily with a meal.  180 tablet  0  . omeprazole (PRILOSEC) 20 MG capsule Take 1 tablet 45 min before breakfast daily  90 capsule  2  . ondansetron (ZOFRAN) 4 MG tablet Take 1 tablet (4 mg total) by mouth daily as needed for nausea.  30 tablet  0  . DISCONTD: fluticasone (FLONASE) 50 MCG/ACT nasal spray 1-2 sprays per nostril BID x 1 wk, then once daily as needed  16 g  0  . Ferrous Gluconate 325 (36 FE) MG TABS Take 1 tablet by mouth 2 (two) times daily.  60 tablet  2    Past Medical History  Diagnosis Date  . Hypertension   . GERD (gastroesophageal reflux disease)   . Allergy   . Farsightedness     wears glasses, [facility]  . Anemia     iron therapy for years as of 10/12  . History of uterine fibroid     Past Surgical History  Procedure Date  . Lipoma excision     forehead  . Knee arthroscopy  left    Family History  Problem Relation Age of Onset  . Hypertension Mother   . Arthritis Mother   . GER disease Mother   . Glaucoma Mother   . Stroke Father   . Alcohol abuse Father   . Cancer Sister     breast cancer dx late 45s; another sister with leukemia  . Hypertension Sister   . Heart disease Sister     heart murmur; other sisters with heart problems  . Diabetes Brother   . Diabetes Maternal Aunt   . Heart disease Maternal Aunt   . Heart disease Maternal Grandmother   . Heart disease Maternal Grandfather   . Heart disease Maternal Uncle   . Stroke Maternal Uncle     History   Social History  . Marital Status: Married    Spouse Name: N/A    Number of Children: N/A  . Years of Education:  N/A   Occupational History  . Location manager    Social History Main Topics  . Smoking status: Never Smoker   . Smokeless tobacco: Never Used  . Alcohol Use: No  . Drug Use: No  . Sexually Active: Not on file   Other Topics Concern  . Not on file   Social History Narrative   Married, 1 child, lives at home.  Location manager.  Active on job.  Does stretching and exercises daily as per physical therapy.  Works 12 hours daily.       Review of Systems Constitutional: -fever, -chills, +sweats, -unexpected weight change, -anorexia, -fatigue Allergy: +sneezing, -itching, -congestion Dermatology: denies changing moles, rash, lumps, new worrisome lesions ENT: +runny nose, -ear pain, -sore throat, -hoarseness, -sinus pain, +teeth pain, -tinnitus, -hearing loss, -epistaxis Cardiology:  -chest pain, -palpitations, -edema, -orthopnea, -paroxysmal nocturnal dyspnea Respiratory: -cough, -shortness of breath, -dyspnea on exertion, -wheezing, -hemoptysis Gastroenterology: -abdominal pain, -nausea, -vomiting, -diarrhea, -constipation, -blood in stool, -changes in bowel movement, -dysphagia Hematology: -bleeding or bruising problems Musculoskeletal: -arthralgias, -myalgias, +joint swelling, +back pain, -neck pain, -cramping, -gait changes Ophthalmology: +vision changes, -eye redness, -itching, -discharge Urology: -dysuria, -difficulty urinating, -hematuria, -urinary frequency, -urgency, incontinence Neurology: -headache, -weakness, +tingling, +numbness, -speech abnormality, -memory loss, -falls, -dizziness Psychology:  -depressed mood, -agitation, +sleep problems     Objective:   Physical Exam  Filed Vitals:   01/16/12 0928  BP: 120/80  Pulse: 76  Temp: 98.3 F (36.8 C)  Resp: 16    General appearance: alert, no distress, WD/WN, obese AA female Skin: scattered benign appearing macules, no worrisome lesions HEENT: normocephalic, conjunctiva/corneas normal, sclerae anicteric, PERRLA,  EOMi, nares patent, no discharge or erythema, pharynx normal Oral cavity: MMM, tongue normal, teeth in good repair Neck: supple, no lymphadenopathy, no thyromegaly, no masses, normal ROM, no bruits Chest: non tender, normal shape and expansion Heart: RRR, normal S1, S2, no murmurs Lungs: CTA bilaterally, no wheezes, rhonchi, or rales Abdomen: +bs, soft, generalized mild tenderness, non distended, no masses, no hepatomegaly, no splenomegaly, no bruits Back: tender throughout lumbar spine, midline and paraspinal, otherwise non tender.  ROM with pain, mildly reduced due to pain, no scoliosis Musculoskeletal: tender along PIPs in general, mild swelling of some of the PIPs, mild pain with hip ROM bilat, otherwise upper extremities non tender, no obvious deformity, normal ROM throughout, lower extremities non tender, no obvious deformity, normal ROM throughout Extremities: no edema, no cyanosis, no clubbing Pulses: 2+ symmetric, upper and lower extremities, normal cap refill Neurological: alert, oriented x 3, CN2-12 intact, strength normal upper extremities  and lower extremities, sensation normal throughout, DTRs 2+ throughout, no cerebellar signs, gait normal Psychiatric: normal affect, behavior normal, pleasant  Breast: nontender, no masses or lumps, no skin changes, no nipple discharge or inversion, no axillary lymphadenopathy Gyn: Normal external genitalia without lesions, vagina with normal mucosa, cervix present without lesions, no cervical motion tenderness, no abnormal vaginal discharge.  Given body habitus, hard to ascertain if uterus enlarged, but there seems to be fullness over the uterus area, adnexa not enlarged, mild generalized tenderness over bilat adnexa and suprapubic region, no obvious masses.  Pap performed.  Exam chaperoned by nurse. Rectal: anus normal appearing, no lesions, no nodules, occult negative stool    Assessment and Plan :   Encounter Diagnoses  Name Primary?  .  Routine general medical examination at a health care facility Yes  . Obesity   . Essential hypertension, benign   . Iron deficiency anemia   . Joint pain   . Joint swelling   . Morning stiffness of joints   . Pelvic pain in female   . Chronic nausea   . Abdominal pain   . Screening for cervical cancer   . Screening for breast cancer    Physical exam - discussed healthy lifestyle, diet, exercise, preventative care, vaccinations, and addressed their concerns.  Handout given.  Obesity - discussed diet,exercise, and the need to lose weight.  Advised she not eat just twice daily, but increase to small frequent meals to promote increased metabolism  HTN - controlled on current medication  Anemia - labs today.  Hasn't been taking her iron for a while now.  Joint pain, swelling and morning stiffness - labs to screen for RA  Pelvic pain - per history and tender on exam.   She reports prior hysterectomy but cervix is present.  Ultrasound orders today  Chronic nausea- pending ultrasound, labs  abdominal pain - pending ultrasound, labs  Pap smear today.  F/u with mammogram as scheduled next month.  Follow-up pending studies

## 2012-01-17 ENCOUNTER — Encounter: Payer: Self-pay | Admitting: Medical

## 2012-01-17 LAB — POCT URINALYSIS DIPSTICK
Bilirubin, UA: NEGATIVE
Blood, UA: NEGATIVE
Glucose, UA: NEGATIVE
Ketones, UA: NEGATIVE
Leukocytes, UA: NEGATIVE
Nitrite, UA: NEGATIVE
Spec Grav, UA: <=1.005
Urobilinogen, UA: NEGATIVE
pH, UA: 8

## 2012-01-17 LAB — CYCLIC CITRUL PEPTIDE ANTIBODY, IGG: Cyclic Citrullin Peptide Ab: <2 U/mL (ref 0.0–5.0)

## 2012-01-17 LAB — SEDIMENTATION RATE: Sed Rate: 11 mm/hr (ref 0–22)

## 2012-01-17 LAB — HIGH SENSITIVITY CRP: CRP, High Sensitivity: 10.7 mg/L — ABNORMAL HIGH

## 2012-01-22 ENCOUNTER — Ambulatory Visit
Admission: RE | Admit: 2012-01-22 | Discharge: 2012-01-22 | Disposition: A | Discharge status: Home / Self Care | Payer: BC Managed Care – PPO | Source: Ambulatory Visit | Attending: Medical | Admitting: Medical

## 2012-01-22 DIAGNOSIS — R102 Pelvic and perineal pain unspecified side: Secondary | ICD-10-CM

## 2012-01-22 DIAGNOSIS — R109 Unspecified abdominal pain: Secondary | ICD-10-CM

## 2012-01-31 ENCOUNTER — Telehealth: Payer: Self-pay | Admitting: Family Medicine

## 2012-01-31 ENCOUNTER — Ambulatory Visit (INDEPENDENT_AMBULATORY_CARE_PROVIDER_SITE_OTHER): Payer: BC Managed Care – PPO | Admitting: Medical

## 2012-01-31 ENCOUNTER — Encounter: Payer: Self-pay | Admitting: Medical

## 2012-01-31 VITALS — BP 120/80 | Temp 98.2°F | Wt 213.0 lb

## 2012-01-31 DIAGNOSIS — B977 Papillomavirus as the cause of diseases classified elsewhere: Secondary | ICD-10-CM

## 2012-01-31 DIAGNOSIS — N949 Unspecified condition associated with female genital organs and menstrual cycle: Secondary | ICD-10-CM

## 2012-01-31 DIAGNOSIS — K8020 Calculus of gallbladder without cholecystitis without obstruction: Secondary | ICD-10-CM

## 2012-01-31 DIAGNOSIS — G8929 Other chronic pain: Secondary | ICD-10-CM

## 2012-01-31 DIAGNOSIS — D259 Leiomyoma of uterus, unspecified: Secondary | ICD-10-CM

## 2012-01-31 DIAGNOSIS — R7982 Elevated C-reactive protein (CRP): Secondary | ICD-10-CM

## 2012-01-31 DIAGNOSIS — M2550 Pain in unspecified joint: Secondary | ICD-10-CM

## 2012-01-31 DIAGNOSIS — R8789 Other abnormal findings in specimens from female genital organs: Secondary | ICD-10-CM

## 2012-01-31 DIAGNOSIS — D649 Anemia, unspecified: Secondary | ICD-10-CM

## 2012-01-31 DIAGNOSIS — R87618 Other abnormal cytological findings on specimens from cervix uteri: Secondary | ICD-10-CM

## 2012-01-31 DIAGNOSIS — R110 Nausea: Secondary | ICD-10-CM

## 2012-01-31 DIAGNOSIS — N946 Dysmenorrhea, unspecified: Secondary | ICD-10-CM

## 2012-01-31 DIAGNOSIS — E669 Obesity, unspecified: Secondary | ICD-10-CM

## 2012-01-31 MED ORDER — NAPROXEN SODIUM ER 375 MG PO TB24: 1.0000 | ORAL_TABLET | Freq: Every day | ORAL | Status: DC | PRN

## 2012-01-31 NOTE — Telephone Encounter (Signed)
PATIENT IS AWARE OF HER APPOINTMENT TO SEE DR. HUNG 02/05/12 @ 100 AM. CLS PATIENT IS ALSO AWARE OF APPOINTMENT TO SEE DR. MODY ON 02/09/12 @ 1130 AM. CLS I FAX OVER ALL INFORMATION TO THE PHYSICIANS OFFICES. CLS

## 2012-01-31 NOTE — Progress Notes (Signed)
Subjective: Here for f/u.   I saw her recently for physical and multiple concerns.  She is here to discuss results.  Of note she has hx/o heavy periods, chronic pelvic pain, hx/o anemia x 3+ years, sometimes takes iron, chronic nausea, multiple ongoing joint pains - fingers, knees, left hip, back.      No Known Allergies  Current Outpatient Prescriptions on File Prior to Visit  Medication Sig Dispense Refill  . Ferrous Gluconate 325 (36 FE) MG TABS Take 1 tablet by mouth 2 (two) times daily.  60 tablet  2  . fluticasone (FLONASE) 50 MCG/ACT nasal spray 1 spray daily.      . hydrochlorothiazide (HYDRODIURIL) 25 MG tablet Take 1 tablet (25 mg total) by mouth daily. PATIENT NEEDS TO SCHEDULE AN OFFICE VISIT TO FOLLOW UP ON HER BP BEFORE HER MEDICATIONS RUN OUT.  90 tablet  0  . loratadine (CLARITIN) 10 MG tablet Take 1 tablet (10 mg total) by mouth daily.  90 tablet  1  . omeprazole (PRILOSEC) 20 MG capsule Take 1 tablet 45 min before breakfast daily  90 capsule  2  . ondansetron (ZOFRAN) 4 MG tablet Take 1 tablet (4 mg total) by mouth daily as needed for nausea.  30 tablet  0  . polyethylene glycol (MIRALAX / GLYCOLAX) packet Take 17 g by mouth daily.        Past Medical History  Diagnosis Date  . Hypertension   . GERD (gastroesophageal reflux disease)   . Allergy   . Farsightedness     wears glasses, [facility]  . Anemia     iron therapy for years as of 10/12  . History of uterine fibroid     Past Surgical History  Procedure Date  . Lipoma excision     forehead  . Knee arthroscopy     left    Family History  Problem Relation Age of Onset  . Hypertension Mother   . Arthritis Mother   . GER disease Mother   . Glaucoma Mother   . Stroke Father   . Alcohol abuse Father   . Cancer Sister     breast cancer dx late 24s; another sister with leukemia  . Hypertension Sister   . Heart disease Sister     heart murmur; other sisters with heart problems  . Diabetes Brother   .  Diabetes Maternal Aunt   . Heart disease Maternal Aunt   . Heart disease Maternal Grandmother   . Heart disease Maternal Grandfather   . Heart disease Maternal Uncle   . Stroke Maternal Uncle     History   Social History  . Marital Status: Married    Spouse Name: N/A    Number of Children: N/A  . Years of Education: N/A   Occupational History  . Location manager    Social History Main Topics  . Smoking status: Never Smoker   . Smokeless tobacco: Never Used  . Alcohol Use: No  . Drug Use: No  . Sexually Active: Not on file   Other Topics Concern  . Not on file   Social History Narrative   Married, 1 child, lives at home.  Location manager.  Active on job.  Does stretching and exercises daily as per physical therapy.  Works 12 hours daily.      Reviewed their medical, surgical, family, social, medication, and allergy history and updated chart as appropriate.  Objective: Gen: wd, wn, nad  Assessment: Encounter Diagnoses  Name Primary?  . Polyarthralgia Yes  . Chronic nausea   . Anemia   . Cholelithiasis   . Chronic pelvic pain in female   . Dysmenorrhea   . Uterine fibroid   . Pap smear abnormality of cervix/human papillomavirus (HPV) positive   . CRP elevated   . Obesity    Plan: Polyarthralgia - at this point still suggestive of OA instead of RA.   Will begin trial of Naprelan once daily prn.  Recheck in a month or 2 after referrals as below.  Chronic nausea, anemia, cholelithiasis - anemia likely due to heavy periods, but given her age and nausea issues, will refer to GI for further evaluation and to rule out worrisome causes.  Chronic pelvic pain, dysmenorrhea, uterine fibroid, pap smear with HPV +: we discussed her recent pap results, ultrasound and concerns.  She had thought incorrectly that she had a hysterectomy.   We will go ahead and refer to gynecology for further evaluation of the pelvic pain and options for therapy.  CRP elevated, obesity  -  discussed possible significance of this.   Advised from a heart standpoint to get more aggressive at losing weight, eating healthier and begin baby aspirin 81mg  daily.   Lipid panel is ok for now.

## 2012-01-31 NOTE — Patient Instructions (Signed)
HPV Test The HPV (human papillomavirus) test is used to screen for high-risk types with HPV infection. HPV is a group of about 100 related viruses, of which 40 types are genital viruses. Most HPV viruses cause infections that usually resolve without treatment within 2 years. Some HPV infections can cause skin and genital warts (condylomata). HPV types 16, 18, 31 and 45 are considered high-risk types of HPV. High-risk types of HPV do not usually cause visible warts, but if untreated, may lead to cancers of the outlet of the womb (cervix) or anus. An HPV test identifies the DNA (genetic) strands of the HPV infection. Because the test identifies the DNA strands, the test is also referred to as the HPV DNA test. Although HPV is found in both males and females, the HPV test is only used to screen for cervical cancer in females. This test is recommended for females:  With an abnormal Pap test.  After treatment of an abnormal Pap test.  Aged 30 and older.  After treatment of a high-risk HPV infection. The HPV test may be done at the same time as a Pap test in females over the age of 30. Both the HPV and Pap test require a sample of cells from the cervix. PREPARATION FOR TEST  You may be asked to avoid douching, tampons, or vaginal medicines for 48 hours before the HPV test. You will be asked to urinate before the test. For the HPV test, you will need to lie on an exam table with your feet in stirrups. A spatula will be inserted into the vagina. The spatula will be used to swab the cervix for a cell and mucus sample. The sample will be further evaluated in a lab under a microscope. NORMAL FINDINGS  Normal: High-risk HPV is not found.  Ranges for normal findings may vary among different laboratories and hospitals. You should always check with your doctor after having lab work or other tests done to discuss the meaning of your test results and whether your values are considered within normal limits. MEANING  OF TEST An abnormal HPV test means that high-risk HPV is found. Your caregiver may recommend further testing. Your caregiver will go over the test results with you. He or she will and discuss the importance and meaning of your results, as well as treatment options and the need for additional tests, if necessary. OBTAINING THE RESULTS  It is your responsibility to obtain your test results. Ask the lab or department performing the test when and how you will get your results. Document Released: 04/14/2004 Document Revised: 06/12/2011 Document Reviewed: 12/28/2004 [employer] Patient Information 2013 [employer].  

## 2012-02-02 ENCOUNTER — Other Ambulatory Visit: Payer: Self-pay | Admitting: Medical

## 2012-02-05 ENCOUNTER — Telehealth: Payer: Self-pay | Admitting: *Deleted

## 2012-02-05 NOTE — Telephone Encounter (Signed)
LM

## 2012-02-12 ENCOUNTER — Ambulatory Visit
Admission: RE | Admit: 2012-02-12 | Discharge: 2012-02-12 | Disposition: A | Discharge status: Home / Self Care | Payer: BC Managed Care – PPO | Source: Ambulatory Visit | Attending: Medical | Admitting: Medical

## 2012-02-12 DIAGNOSIS — Z1231 Encounter for screening mammogram for malignant neoplasm of breast: Secondary | ICD-10-CM

## 2012-02-22 HISTORY — PX: ESOPHAGOGASTRODUODENOSCOPY: SHX1529

## 2012-02-23 ENCOUNTER — Emergency Department (INDEPENDENT_AMBULATORY_CARE_PROVIDER_SITE_OTHER)
Admission: EM | Admit: 2012-02-23 | Discharge: 2012-02-23 | Disposition: A | Discharge status: Home / Self Care | Payer: BC Managed Care – PPO | Source: Home / Self Care | Attending: Family Medicine | Admitting: Family Medicine

## 2012-02-23 ENCOUNTER — Telehealth: Payer: Self-pay | Admitting: Gastroenterology

## 2012-02-23 ENCOUNTER — Encounter (HOSPITAL_COMMUNITY): Payer: Self-pay | Admitting: *Deleted

## 2012-02-23 DIAGNOSIS — R197 Diarrhea, unspecified: Secondary | ICD-10-CM

## 2012-02-23 DIAGNOSIS — R112 Nausea with vomiting, unspecified: Secondary | ICD-10-CM

## 2012-02-23 HISTORY — DX: Dorsalgia, unspecified: M54.9

## 2012-02-23 HISTORY — DX: Other chronic pain: G89.29

## 2012-02-23 LAB — POCT I-STAT, CHEM 8
BUN: 16 mg/dL (ref 6–23)
Calcium, Ion: 1.24 mmol/L — ABNORMAL HIGH (ref 1.12–1.23)
Chloride: 103 mEq/L (ref 96–112)
Creatinine, Ser: 0.9 mg/dL (ref 0.50–1.10)
Glucose, Bld: 90 mg/dL (ref 70–99)
HCT: 44 % (ref 36.0–46.0)
Hemoglobin: 15 g/dL (ref 12.0–15.0)
Potassium: 3.7 mEq/L (ref 3.5–5.1)
Sodium: 142 mEq/L (ref 135–145)
TCO2: 28 mmol/L (ref 0–100)

## 2012-02-23 MED ORDER — ONDANSETRON 4 MG PO TBDP
ORAL_TABLET | ORAL | Status: AC
  Filled 2012-02-23: qty 2

## 2012-02-23 MED ORDER — LOPERAMIDE HCL 2 MG PO CAPS: 2.0000 mg | ORAL_CAPSULE | Freq: Four times a day (QID) | ORAL | Status: DC | PRN

## 2012-02-23 MED ORDER — ONDANSETRON 4 MG PO TBDP
8.0000 mg | ORAL_TABLET | Freq: Once | ORAL | Status: AC
  Administered 2012-02-23: 8 mg via ORAL

## 2012-02-23 MED ORDER — ONDANSETRON HCL 4 MG PO TABS: 4.0000 mg | ORAL_TABLET | Freq: Three times a day (TID) | ORAL | Status: DC | PRN

## 2012-02-23 NOTE — ED Notes (Signed)
Denies any c/o's at present.  States had endoscopy yesterday.  Today was at work when she had normal BM, then shortly thereafter started breaking out in sweat with nausea (had emesis x 2 without any blood), HA, and circumoral tingling; these sxs were intermittent over a 2 hr span, and completely ended @ approx 1800.  States during episodes would "try to talk but would have trouble getting it out".  Patient speaking very quietly @ present, and appears a little restless in wheelchair.  Spouse states pt seems completely normal to him at present.  Smile symmetrical, tongue midline, bilat hand grasps equal and strong; no arm drift.  Patient in wheelchair; when asked if she's having difficulty ambulating, states she's not, she's just in a wheelchair due to her chronic back pain acting up.

## 2012-02-23 NOTE — ED Provider Notes (Signed)
History     CSN: [id number]  Arrival date & time 02/23/12  1911   First MD Initiated Contact with Patient 02/23/12 1929      Chief Complaint  Patient presents with  . Tingling  . Headache  . Nausea    (Consider location/radiation/quality/duration/timing/severity/associated sxs/prior treatment) HPI Comments: 51 year old female with history of recurrent nausea and vomiting. S/p upper GI endoscopy yesterday. Here complaining of an episode of abdominal pain associated with sweating, nausea, vomiting of food content (no blood) twice; also large diarrhea 3 times (no blood or melena) while she still was at work around 4 PM today. Patient reports her symptoms were also associated with headache and tingling sensation around her mouth intermittently at the beginning of the nausea, vomiting and diarrhea. Denies blood or mucus in the stools. Has had about 5 episodes of large diarrhea total last time 2 hours ago. Last emesis over 4 hours ago. Still feels mildly nauseous but denies abdominal pain or headache while she has been here. Currently denies perioral tingling, face numbness or weakness. Also denies extremity numbness, weakness or paresthesia. No balance or gait problems. No visual changes. Denies dysuria or hematuria. No urinary frequency, urgency or incontinence.   Past Medical History  Diagnosis Date  . Hypertension   . GERD (gastroesophageal reflux disease)   . Allergy   . Farsightedness     wears glasses, [facility] care center  . Anemia     iron therapy for years as of 10/12  . History of uterine fibroid   . Chronic back pain     Past Surgical History  Procedure Date  . Lipoma excision     forehead  . Knee arthroscopy     left  . Uterine fibroid surgery   . Gall stone surgery     Family History  Problem Relation Age of Onset  . Hypertension Mother   . Arthritis Mother   . GER disease Mother   . Glaucoma Mother   . Stroke Father   . Alcohol abuse Father   . Cancer Sister       breast cancer dx late 80s; another sister with leukemia  . Hypertension Sister   . Heart disease Sister     heart murmur; other sisters with heart problems  . Diabetes Brother   . Diabetes Maternal Aunt   . Heart disease Maternal Aunt   . Heart disease Maternal Grandmother   . Heart disease Maternal Grandfather   . Heart disease Maternal Uncle   . Stroke Maternal Uncle     History  Substance Use Topics  . Smoking status: Never Smoker   . Smokeless tobacco: Never Used  . Alcohol Use: No    OB History    Grav Para Term Preterm Abortions TAB SAB Ect Mult Living                  Review of Systems  Constitutional: Negative for fever, chills and fatigue.  HENT: Negative for tinnitus.   Eyes: Negative for visual disturbance.  Respiratory: Negative for shortness of breath.   Cardiovascular: Negative for chest pain.  Gastrointestinal: Positive for nausea, vomiting, abdominal pain and diarrhea. Negative for blood in stool.  Genitourinary: Negative for dysuria, urgency, frequency, flank pain, vaginal bleeding, vaginal discharge and pelvic pain.  Neurological: Positive for headaches. Negative for tremors, seizures, syncope, speech difficulty, weakness and numbness.  All other systems reviewed and are negative.    Allergies  Review of patient's allergies indicates no known allergies.  Home Medications   Current Outpatient Rx  Name  Route  Sig  Dispense  Refill  . FLUTICASONE PROPIONATE 50 MCG/ACT NA SUSP      1 spray daily.         Marland Kitchen HYDROCHLOROTHIAZIDE 25 MG PO TABS   Oral   Take 1 tablet (25 mg total) by mouth daily. PATIENT NEEDS TO SCHEDULE AN OFFICE VISIT TO FOLLOW UP ON HER BP BEFORE HER MEDICATIONS RUN OUT.   90 tablet   0   . LORATADINE 10 MG PO TABS   Oral   Take 1 tablet (10 mg total) by mouth daily.   90 tablet   1   . NAPROXEN SODIUM ER 375 MG PO TB24   Oral   Take 1 tablet (375 mg total) by mouth daily as needed.   30 each   1   . OMEPRAZOLE  20 MG PO CPDR      Take 1 tablet 45 min before breakfast daily   90 capsule   2   . POLYETHYLENE GLYCOL 3350 PO PACK   Oral   Take 17 g by mouth daily.         Marland Kitchen FERROUS GLUCONATE 325 (36 FE) MG PO TABS   Oral   Take 1 tablet by mouth 2 (two) times daily.   60 tablet   2   . LOPERAMIDE HCL 2 MG PO CAPS   Oral   Take 1 capsule (2 mg total) by mouth 4 (four) times daily as needed for diarrhea or loose stools.   12 capsule   0   . OMEPRAZOLE 20 MG PO CPDR      TAKE ONE CAPSULE BY MOUTH 45 MINUTES BEFORE BREAKFAST DAILY   30 capsule   10   . ONDANSETRON HCL 4 MG PO TABS   Oral   Take 1 tablet (4 mg total) by mouth every 8 (eight) hours as needed for nausea.   10 tablet   0     BP 121/57  Pulse 73  Temp 98.7 F (37.1 C) (Oral)  Resp 12  SpO2 100%  LMP 02/08/2012  Physical Exam  Nursing note and vitals reviewed. Constitutional: She is oriented to person, place, and time. She appears well-developed and well-nourished. No distress.  HENT:  Head: Normocephalic and atraumatic.  Right Ear: External ear normal.  Left Ear: External ear normal.  Nose: Nose normal.  Mouth/Throat: Oropharynx is clear and moist. No oropharyngeal exudate.  Eyes: Conjunctivae normal and EOM are normal. Pupils are equal, round, and reactive to light. No scleral icterus.  Neck: Neck supple. No thyromegaly present.  Cardiovascular: Normal rate, regular rhythm and normal heart sounds.   Pulmonary/Chest: Effort normal and breath sounds normal.  Abdominal: Soft. Bowel sounds are normal. She exhibits no distension and no mass. There is no tenderness. There is no rebound and no guarding.  Lymphadenopathy:    She has no cervical adenopathy.  Neurological: She is alert and oriented to person, place, and time. She has normal reflexes. No cranial nerve deficit or sensory deficit. She exhibits normal muscle tone. She displays a negative Romberg sign. Coordination and gait normal.       Tongue mid  line. No face drop. No arm drop.  Visual fields normal by comparison.   Skin: No rash noted. She is not diaphoretic.  Psychiatric: She has a normal mood and affect. Her behavior is normal. Judgment and thought content normal.    ED Course  Procedures (including  critical care time)  Labs Reviewed  POCT I-STAT, CHEM 8 - Abnormal; Notable for the following:    Calcium, Ion 1.24 (*)     All other components within normal limits   No results found.   1. Nausea vomiting and diarrhea       MDM  Normal neurologic examination. Normal electrolytes. Abdominal exam is reassuring and no suggestive of acute abdomen. Patient had ondansetron 8 mg sublingual x1 here was asymptomatic and tolerating fluids without vomiting or diarrhea for at least one hour prior to discharge. Asked to follow up with her GI or PCP to monitor symptoms.   Sharin Grave, MD 02/24/12 1222

## 2012-02-23 NOTE — ED Notes (Signed)
Instructed to take small, frequent sips of Sprite after Zofran dissolves.  Patient verbalized understanding.

## 2012-02-23 NOTE — Telephone Encounter (Signed)
On call note.  She has had nausea, vomiting, tingling on the side of her mouth today.  Tells me she had EGD thurday with Dr. Elnoria Howard and some 'redness was seen.'  Doesn't have copy of the report to read to me.  Has mild abd pains.  No fevers or chills.  I cannot tell what is going on, but reviewing EPIC it looks like she's had nausea for some time.  I advised her to call Dr. Haywood Pao office on Monday AM or if she is really feeling sick to go to urgent clinic or ER if needed.

## 2012-02-27 ENCOUNTER — Encounter: Payer: Self-pay | Admitting: Medical

## 2012-03-11 ENCOUNTER — Telehealth: Payer: Self-pay | Admitting: Medical

## 2012-03-13 ENCOUNTER — Other Ambulatory Visit: Payer: Self-pay | Admitting: Family Medicine

## 2012-03-13 MED ORDER — OMEPRAZOLE 20 MG PO CPDR: DELAYED_RELEASE_CAPSULE | ORAL | Status: DC

## 2012-03-13 NOTE — Telephone Encounter (Signed)
i received FMLA.  This form asks for specific reasons why she may miss work for medical issues.   For what particular issue is she needing approved time out of work?  I have seen her for multiple concerns.  I'm assuming joint pains, or gastric issue.  Narrow her down to a specific medical issue.

## 2012-03-13 NOTE — Telephone Encounter (Signed)
Patient states that she miss work on the 21 st and the 22 nd because she was referred to the GI doctor to have the procedure done and she said she had the procedure on the 21 st and she tried to go back to work on the 22nd but she got sick and had to go back home. CLS

## 2012-03-13 NOTE — Telephone Encounter (Signed)
FMLA needs to be copied, she needs to be billed the fee, and return her the FMLA forms.

## 2012-03-14 ENCOUNTER — Telehealth: Payer: Self-pay | Admitting: Medical

## 2012-03-14 NOTE — Telephone Encounter (Signed)
LM

## 2012-04-05 ENCOUNTER — Other Ambulatory Visit: Payer: Self-pay | Admitting: Medical

## 2012-04-10 ENCOUNTER — Other Ambulatory Visit: Payer: Self-pay | Admitting: Medical

## 2012-04-23 ENCOUNTER — Telehealth: Payer: Self-pay | Admitting: Internal Medicine

## 2012-04-23 MED ORDER — HYDROCHLOROTHIAZIDE 25 MG PO TABS: 25.0000 mg | ORAL_TABLET | Freq: Every day | ORAL | Status: DC

## 2012-04-23 NOTE — Telephone Encounter (Signed)
Pt has an appt next week and i have sent hctz 25mg  to the pharmacy for the pt.

## 2012-05-01 ENCOUNTER — Encounter: Payer: BC Managed Care – PPO | Admitting: Medical

## 2012-05-04 ENCOUNTER — Emergency Department (HOSPITAL_COMMUNITY): Payer: BC Managed Care – PPO

## 2012-05-04 ENCOUNTER — Encounter (HOSPITAL_COMMUNITY): Payer: Self-pay | Admitting: Emergency Medicine

## 2012-05-04 ENCOUNTER — Emergency Department (HOSPITAL_COMMUNITY)
Admission: EM | Admit: 2012-05-04 | Discharge: 2012-05-04 | Disposition: A | Discharge status: Home / Self Care | Payer: BC Managed Care – PPO | Attending: Emergency Medicine | Admitting: Emergency Medicine

## 2012-05-04 DIAGNOSIS — M545 Low back pain, unspecified: Secondary | ICD-10-CM | POA: Insufficient documentation

## 2012-05-04 DIAGNOSIS — M5136 Other intervertebral disc degeneration, lumbar region: Secondary | ICD-10-CM

## 2012-05-04 DIAGNOSIS — M549 Dorsalgia, unspecified: Secondary | ICD-10-CM

## 2012-05-04 DIAGNOSIS — M5137 Other intervertebral disc degeneration, lumbosacral region: Secondary | ICD-10-CM | POA: Insufficient documentation

## 2012-05-04 DIAGNOSIS — M51379 Other intervertebral disc degeneration, lumbosacral region without mention of lumbar back pain or lower extremity pain: Secondary | ICD-10-CM | POA: Insufficient documentation

## 2012-05-04 DIAGNOSIS — M79609 Pain in unspecified limb: Secondary | ICD-10-CM | POA: Insufficient documentation

## 2012-05-04 DIAGNOSIS — D649 Anemia, unspecified: Secondary | ICD-10-CM | POA: Insufficient documentation

## 2012-05-04 DIAGNOSIS — Z8669 Personal history of other diseases of the nervous system and sense organs: Secondary | ICD-10-CM | POA: Insufficient documentation

## 2012-05-04 DIAGNOSIS — K219 Gastro-esophageal reflux disease without esophagitis: Secondary | ICD-10-CM | POA: Insufficient documentation

## 2012-05-04 DIAGNOSIS — Z79899 Other long term (current) drug therapy: Secondary | ICD-10-CM | POA: Insufficient documentation

## 2012-05-04 DIAGNOSIS — I10 Essential (primary) hypertension: Secondary | ICD-10-CM | POA: Insufficient documentation

## 2012-05-04 DIAGNOSIS — R209 Unspecified disturbances of skin sensation: Secondary | ICD-10-CM | POA: Insufficient documentation

## 2012-05-04 MED ORDER — PREDNISONE 10 MG PO TABS: 20.0000 mg | ORAL_TABLET | Freq: Every day | ORAL | Status: DC

## 2012-05-04 MED ORDER — OXYCODONE-ACETAMINOPHEN 5-325 MG PO TABS
2.0000 | ORAL_TABLET | Freq: Once | ORAL | Status: AC
  Administered 2012-05-04: 2 via ORAL
  Filled 2012-05-04: qty 2

## 2012-05-04 MED ORDER — CYCLOBENZAPRINE HCL 10 MG PO TABS: 10.0000 mg | ORAL_TABLET | Freq: Three times a day (TID) | ORAL | Status: DC | PRN

## 2012-05-04 MED ORDER — HYDROCODONE-ACETAMINOPHEN 5-325 MG PO TABS: 1.0000 | ORAL_TABLET | ORAL | Status: DC | PRN

## 2012-05-04 NOTE — ED Notes (Signed)
Patient transported to X-ray 

## 2012-05-04 NOTE — ED Notes (Signed)
Patient back from x-ray 

## 2012-05-04 NOTE — ED Notes (Signed)
Reports right sided lower back pain that radiates down R leg since 7pm.  Reports history of same in the past. No known injury.

## 2012-05-04 NOTE — ED Provider Notes (Signed)
Medical screening examination/treatment/procedure(s) were performed by non-physician practitioner and as supervising physician I was immediately available for consultation/collaboration.  Olivia Mackie, MD 05/04/12 [phone]

## 2012-05-04 NOTE — ED Provider Notes (Signed)
History     CSN: [id number]  Arrival date & time 05/04/12  0108   First MD Initiated Contact with Patient 05/04/12 0134      Chief Complaint  Patient presents with  . Back Pain  . Leg Pain   HPI  History provided by the patient. Patient is a 52 year old female with history of hypertension who presents with complaints of worsening lower back pain. Patient reports having some waxing and waning low back pains for the past 2 years or more. This evening pain has been significantly worse and radiates down to her right buttocks and leg to the knee area. Pain is sometimes associated with tingling and numbness sensations that are episodic. Pain is worse with certain movements and walking. She denies any weakness to the lower extremities. She denies any urinary complaints. No urinary frequency, dysuria, hematuria. She denies any urinary or fecal incontinence, urinary retention or perineal numbness. Patient has tried over-the-counter pain medications without significant relief. She denies any other aggravating or alleviating factors. Denies any other associated symptoms.     Past Medical History  Diagnosis Date  . Hypertension   . GERD (gastroesophageal reflux disease)   . Allergy   . Farsightedness     wears glasses, [facility]  . Anemia     iron therapy for years as of 10/12  . History of uterine fibroid   . Chronic back pain     Past Surgical History  Procedure Date  . Lipoma excision     forehead  . Knee arthroscopy     left  . Uterine fibroid surgery   . Gall stone surgery     Family History  Problem Relation Age of Onset  . Hypertension Mother   . Arthritis Mother   . GER disease Mother   . Glaucoma Mother   . Stroke Father   . Alcohol abuse Father   . Cancer Sister     breast cancer dx late 110s; another sister with leukemia  . Hypertension Sister   . Heart disease Sister     heart murmur; other sisters with heart problems  . Diabetes Brother   . Diabetes  Maternal Aunt   . Heart disease Maternal Aunt   . Heart disease Maternal Grandmother   . Heart disease Maternal Grandfather   . Heart disease Maternal Uncle   . Stroke Maternal Uncle     History  Substance Use Topics  . Smoking status: Never Smoker   . Smokeless tobacco: Never Used  . Alcohol Use: No    OB History    Grav Para Term Preterm Abortions TAB SAB Ect Mult Living                  Review of Systems  Constitutional: Negative for fever and chills.  Cardiovascular: Negative for leg swelling.  Genitourinary: Negative for dysuria, frequency, hematuria, flank pain and difficulty urinating.  Musculoskeletal: Positive for back pain.  Neurological: Positive for numbness. Negative for weakness.  All other systems reviewed and are negative.    Allergies  Review of patient's allergies indicates no known allergies.  Home Medications   Current Outpatient Rx  Name  Route  Sig  Dispense  Refill  . DEXLANSOPRAZOLE 60 MG PO CPDR   Oral   Take 60 mg by mouth daily.         Marland Kitchen FERROUS GLUCONATE 325 (36 FE) MG PO TABS   Oral   Take 1 tablet by mouth 2 (two) times  daily.   60 tablet   2   . FLUTICASONE PROPIONATE 50 MCG/ACT NA SUSP      USE ONE TO TWO SPRAY IN EACH NOSTRIL TWICE DAILY FOR ONE WEEK, THEN USE ONCE DAILY AS NEEDED   16 g   0   . HYDROCHLOROTHIAZIDE 25 MG PO TABS   Oral   Take 1 tablet (25 mg total) by mouth daily.   90 tablet   0   . LORATADINE 10 MG PO TABS   Oral   Take 1 tablet (10 mg total) by mouth daily.   90 tablet   1   . NORETHINDRONE 0.35 MG PO TABS   Oral   Take 1 tablet by mouth daily.         Marland Kitchen POLYETHYLENE GLYCOL 3350 PO PACK   Oral   Take 17 g by mouth daily.           BP 122/71  Pulse 74  Temp 98.6 F (37 C) (Oral)  Resp 18  SpO2 99%  LMP 04/03/2012  Physical Exam  Nursing note and vitals reviewed. Constitutional: She is oriented to person, place, and time. She appears well-developed and well-nourished. No  distress.  HENT:  Head: Normocephalic.  Cardiovascular: Normal rate and regular rhythm.   Pulmonary/Chest: Effort normal and breath sounds normal.  Abdominal: Soft.  Musculoskeletal:       Lumbar back: She exhibits tenderness. She exhibits no swelling and no deformity.       Back:  Neurological: She is alert and oriented to person, place, and time. She has normal strength. No sensory deficit.       Patient with slight limp favoring right side.  Skin: Skin is warm and dry. No rash noted.  Psychiatric: She has a normal mood and affect. Her behavior is normal.    ED Course  Procedures   Dg Lumbar Spine Complete  05/04/2012  *RADIOLOGY REPORT*  Clinical Data: Low back pain radiating to the right leg.  Numbness and weakness of the right leg.  No known injury.  LUMBAR SPINE - COMPLETE 4+ VIEW  Comparison: None.  Findings: Five lumbar type vertebral bodies.  The normal alignment of the lumbar vertebrae and facet joints.  Degenerative changes are present throughout with narrowed lumbar interspaces and associated hypertrophic changes.  Degenerative changes in the facet joints. No vertebral compression deformities.  No focal bone lesion or bone destruction.  Bone cortex and trabecular architecture appear intact.  Surgical clips in the right upper quadrant.  IMPRESSION: Degenerative changes.  No displaced fractures identified.   Original Report Authenticated By: Burman Nieves, M.D.      1. Degenerative disc disease, lumbar   2. Back pain       MDM  2:15 AM patient seen and evaluated. Patient appears uncomfortable especially with any movements. She does not appear in any acute distress. No symptoms or history concerning for cauda equina.   X-rays were signs of degenerative changes. Patient has symptoms consistent with radicular back pain. Only provide symptomatic treatment and have patient continue followup with PCP.     Angus Seller, [place] 05/04/12 [phone]

## 2012-05-06 ENCOUNTER — Ambulatory Visit
Admission: RE | Admit: 2012-05-06 | Discharge: 2012-05-06 | Disposition: A | Discharge status: Home / Self Care | Payer: BC Managed Care – PPO | Source: Ambulatory Visit | Attending: Medical | Admitting: Medical

## 2012-05-06 ENCOUNTER — Encounter: Payer: Self-pay | Admitting: Medical

## 2012-05-06 ENCOUNTER — Ambulatory Visit (INDEPENDENT_AMBULATORY_CARE_PROVIDER_SITE_OTHER): Payer: BC Managed Care – PPO | Admitting: Medical

## 2012-05-06 VITALS — BP 110/70 | HR 88 | Temp 98.3°F | Resp 16 | Wt 208.0 lb

## 2012-05-06 DIAGNOSIS — R2689 Other abnormalities of gait and mobility: Secondary | ICD-10-CM

## 2012-05-06 DIAGNOSIS — M545 Low back pain, unspecified: Secondary | ICD-10-CM

## 2012-05-06 DIAGNOSIS — IMO0002 Reserved for concepts with insufficient information to code with codable children: Secondary | ICD-10-CM

## 2012-05-06 DIAGNOSIS — M79604 Pain in right leg: Secondary | ICD-10-CM

## 2012-05-06 DIAGNOSIS — R209 Unspecified disturbances of skin sensation: Secondary | ICD-10-CM

## 2012-05-06 DIAGNOSIS — M5410 Radiculopathy, site unspecified: Secondary | ICD-10-CM

## 2012-05-06 DIAGNOSIS — Z789 Other specified health status: Secondary | ICD-10-CM

## 2012-05-06 DIAGNOSIS — R202 Paresthesia of skin: Secondary | ICD-10-CM

## 2012-05-06 NOTE — Progress Notes (Signed)
Subjective: Here for emergency dept f/u.   She has hx/o chronic back pain, and I have seen her prior for back issues, have referred her to PT in the past.  However, she had worsening flare up of back pain Friday.  Been having low back pain and pain shooting down right leg, started Friday 3 days ago.    Went to ED Friday night, she says they called it a muscle spasm, gave her some muscle relaxer and pain medication, but told her to f/u here for MRI, further evaluation.   So far not much improvement, can't walk on the right leg much due to pain.  Pain 10/10 on the right.  She notes that Friday at work was pushing bin at work, and initially had sharp pains lifting items into the bins.  By that evening had much more low back pain and pain down leg. No other, trauma or fall.  She denies bowel or bladder loss, no blood in urine or stool, no fever, no joint swelling, no recent illness.  Left leg is fine but does have pain and tingling coming down right hip and leg, mostly posterior and lateral.  Past Medical History  Diagnosis Date  . Hypertension   . GERD (gastroesophageal reflux disease)   . Allergy   . Farsightedness     wears glasses, [facility]  . Anemia     iron therapy for years as of 10/12  . History of uterine fibroid   . Chronic back pain    Past Surgical History  Procedure Date  . Lipoma excision     forehead  . Knee arthroscopy     left  . Uterine fibroid surgery   . Gall stone surgery     Objective: Gen: wd, wn, in pain Skin: warm, dry, no erythema or ecchymosis Back: right paraspinal lumbar tenderness, otherwise nontender to palpation, flexion 90 degrees, extension to 10 degrees, both decreased due to pain, otherwise back without obvious scoliosis MSK: tenderness over right hip generalized, but no point tenderness, decreased right hip external ROM, otherwise right leg nontender, no obvious deformity, left leg unremarkable Neuro: 1+ bilat LE DTRs, strength reduced of right  leg both upper and lower leg, +SLR on right to 20 degrees, unable to heel or toe walk on the right leg, difficulty with weight bearing   Assessment: Encounter Diagnoses  Name Primary?  . Low back pain radiating to right leg Yes  . Radicular pain of right lower extremity   . Paresthesia of lower limb   . Unable to bear weight    Plan: Will set up for MRI L spine.   She has 2+ year hx/o chronic low back pain, but acute worsening this weekend, unable to bear weight on the right, pain and tingling down right leg, posterior and lateral thigh , unable to bear weight much on right leg due to the leg and hip pain, and so far no improvement after 2.5 days of Norco, Flexeril and Prednisone.   I have seen her prior for back pain issues, sent to PT in the past with some improvement, but never full resolution of pain.   F/u pending results. For now, c/t same medications, and gave note for work.

## 2012-05-09 ENCOUNTER — Inpatient Hospital Stay: Admission: RE | Admit: 2012-05-09 | Payer: BC Managed Care – PPO | Source: Ambulatory Visit

## 2012-05-10 ENCOUNTER — Telehealth: Payer: Self-pay | Admitting: Family Medicine

## 2012-05-10 NOTE — Telephone Encounter (Signed)
Patient is aware of her appointment at [facility] on 05/13/12 @ 8 am. CLS [phone]

## 2012-06-08 ENCOUNTER — Other Ambulatory Visit: Payer: Self-pay | Admitting: Medical

## 2012-07-09 ENCOUNTER — Telehealth: Payer: Self-pay | Admitting: Family Medicine

## 2012-07-09 NOTE — Telephone Encounter (Signed)
Pt called and states that her employer [employer] said that they called here and talked with [place] regarding her FMLA.  I see no notes in her chart and advised pt we do not talk with employers regarding our patients.  She will follow up with her employer.

## 2012-07-16 ENCOUNTER — Other Ambulatory Visit: Payer: Self-pay | Admitting: Medical

## 2012-08-17 ENCOUNTER — Other Ambulatory Visit: Payer: Self-pay | Admitting: Medical

## 2012-11-06 ENCOUNTER — Encounter: Payer: Self-pay | Admitting: Family Medicine

## 2012-11-06 ENCOUNTER — Ambulatory Visit (INDEPENDENT_AMBULATORY_CARE_PROVIDER_SITE_OTHER): Payer: BC Managed Care – PPO | Admitting: Family Medicine

## 2012-11-06 VITALS — BP 120/80 | HR 78 | Temp 98.5°F | Wt 202.0 lb

## 2012-11-06 DIAGNOSIS — R109 Unspecified abdominal pain: Secondary | ICD-10-CM

## 2012-11-06 DIAGNOSIS — R110 Nausea: Secondary | ICD-10-CM

## 2012-11-06 MED ORDER — ONDANSETRON HCL 4 MG PO TABS: 4.0000 mg | ORAL_TABLET | Freq: Three times a day (TID) | ORAL | Status: DC | PRN

## 2012-11-06 NOTE — Progress Notes (Signed)
  Subjective:    Patient ID: Lisa Briggs, female    DOB: 1961-02-26, 52 y.o.   MRN: [id number]  HPI She has a history of difficulty with nausea, abdominal pain and diarrhea. It was very difficult to get a good history from her but apparently she has had trouble with this intermittently for an extensive period of time. She has been evaluated in the past by Dr. Elnoria Howard with an endoscopy. 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 Last time she missed work because of this was apparently last year. This episode started 2 days ago with some nausea follow the next day by abdominal pain and some diarrhea. He has kept her from working. She cannot relate this to heating. There is no bloating.   Review of Systems     Objective:   Physical Exam Cardiac exam shows regular rhythm without murmurs or gallops. Lungs are clear to auscultation. Abdominal exam shows no masses or tenderness with decreased bowel sounds       Assessment & Plan:  Abdominal pain, unspecified site  Nausea alone - Plan: ondansetron (ZOFRAN) 4 MG tablet  she would like a note to return to work on Friday. Explained that if she cannot return to work on Friday to let us know. We'll refer back to Dr. Elnoria Howard if continued difficulty.

## 2012-12-04 ENCOUNTER — Telehealth: Payer: Self-pay | Admitting: Medical

## 2012-12-04 NOTE — Telephone Encounter (Signed)
I looked briefly at the [facility] request.   Here are concerns I have so I have not done anything with the FMLA, please help.  1) she saw Dr. Susann Givens for this recently, so I am not aware of all the details of their conversation 2) per his notes, she already has FMLA in place for this and reading over his notes, I don't think he is aware she was out 5 days, or was aware that she would be needing new FMLA 3) if she does have FMLA in place from last year for the same, are they not good for 12 months for a particular condition? 4) finally, his note says if she wasn't imporving, may end up needing to see Dr. Elnoria Howard again/GI

## 2012-12-05 NOTE — Telephone Encounter (Signed)
FMLA IS ONLY GOOD FOR 26 WEEKS/6 MONTHS

## 2012-12-09 ENCOUNTER — Telehealth: Payer: Self-pay | Admitting: Medical

## 2012-12-09 NOTE — Telephone Encounter (Signed)
Message copied by Ruffin Frederick on Mon Dec 09, 2012  4:40 PM ------      Message from: Jac Canavan      Created: Mon Dec 09, 2012  1:42 PM       After looking back over her chart, if this request is in reference to abdominal pain FMLA, I don't see any records in my notes or Dr. Bernarda Caffey that suggests a reason to miss work related to abdominal issues at this time.   Thus, I can't do an abdominal pain FMLA.   If she is missing work due to abdominal pain, then I can see her back to discuss the issue and further evaluate the pain as necessary.               She does have hx/o GERD, but this can't be used as a reason to permit absence from work.  After speaking to Dr. Susann Givens who she saw recently, I don't believe they discussed her missing numerous occasions from work regarding abdominal pain, and they didn't discuss FMLA                   ------

## 2012-12-10 ENCOUNTER — Telehealth: Payer: Self-pay | Admitting: Medical

## 2012-12-10 NOTE — Telephone Encounter (Signed)
lm

## 2012-12-11 ENCOUNTER — Ambulatory Visit (INDEPENDENT_AMBULATORY_CARE_PROVIDER_SITE_OTHER): Payer: BC Managed Care – PPO | Admitting: Medical

## 2012-12-11 ENCOUNTER — Encounter: Payer: Self-pay | Admitting: Medical

## 2012-12-11 VITALS — BP 118/70 | HR 60 | Temp 97.7°F | Resp 16 | Wt 205.0 lb

## 2012-12-11 DIAGNOSIS — N920 Excessive and frequent menstruation with regular cycle: Secondary | ICD-10-CM

## 2012-12-11 DIAGNOSIS — R110 Nausea: Secondary | ICD-10-CM

## 2012-12-11 DIAGNOSIS — D259 Leiomyoma of uterus, unspecified: Secondary | ICD-10-CM

## 2012-12-11 DIAGNOSIS — R109 Unspecified abdominal pain: Secondary | ICD-10-CM

## 2012-12-11 DIAGNOSIS — G8929 Other chronic pain: Secondary | ICD-10-CM

## 2012-12-11 DIAGNOSIS — R102 Pelvic and perineal pain: Secondary | ICD-10-CM

## 2012-12-11 DIAGNOSIS — K2970 Gastritis, unspecified, without bleeding: Secondary | ICD-10-CM

## 2012-12-11 NOTE — Progress Notes (Signed)
Subjective:  Lisa Briggs is a 52 y.o. female who presents for recheck regarding abdominal pain and FMLA.  She most recently had FMLA on file regarding back pain, but after physical therapy referral and home exercises, she notes that her back seems to be doing pretty good.  She notes hx/o chronic abdominal pain, hx/o gall stones, hx/o GERD, chronic nausea, bloating, pelvic pain, heavy periods for at least last few years.  I completed FMLA 2013 with plans for specialty referrals which we did.   GI found her to have gastritis on EGD, but no other diagnosis.  She saw gynecology/Dr. Juliene Briggs, and although several things were recommended as options, she apparently was not agreeable to any of them.  Currently periods are about ever 2-49mo, still heavy.    At this point, she still notes intermittent issues with abdominal and/or pelvic pains.  Both are intermittent.  abdominal pain, nausea, "reflux flare ups", occur episodic.  Sometimes has several days of flare ups, sometimes can go 3 mo between flare ups.  She notes missing 5 days of work recently when she saw Dr. Susann Briggs here for a "reflux flare up."  She notes sometimes the reflux is bad enough to miss work.  She points to RUQ as the area of pain.   Pain can be present certainly with sweets, but Gatorade helps.  Not particularly different with fried or fatty foods, but she is not sure.  She notes that the RUQ pains are usually sharp in nature, lasting for minutes to hours.  Sometimes these pains "go to her womb."    She is a nonsmoker.     No other c/o.  The following portions of the patient's history were reviewed and updated as appropriate: allergies, current medications, past family history, past medical history, past social history, past surgical history and problem list.  ROS Otherwise as in subjective above  Objective: Physical Exam  Vital signs reviewed  General appearance: alert, no distress, WD/WN, AA female Oral cavity: MMM, no lesions Neck:  supple, no lymphadenopathy, no thyromegaly, no masses Heart: RRR, normal S1, S2, no murmurs Lungs: CTA bilaterally, no wheezes, rhonchi, or rales Abdomen: +bs, soft, mild generalized tenderness, non distended, no masses, no hepatomegaly, no splenomegaly Back: nontender, mild pain with flexion, but ROM full, no scoliosis Pulses: 2+ radial pulses, 2+ pedal pulses, normal cap refill Ext: no edema   Assessment: Encounter Diagnoses  Name Primary?  . Chronic abdominal pain Yes  . Chronic nausea   . Gastritis   . Pelvic pain   . Uterine fibroid   . Heavy periods    Plan: Reviewed 2013 GI notes regarding gastritis and EGD.  Was advised to use Dexilant which she is doing.  She is 52yo now and due for first screening colonoscopy.  At this point I advised her that I can't complete the FMLA without further guidance from GI and Gyn.  Gastritis and gall stones alone is the only objective finding regarding abdominal pain at this point.  We will refer back to GI.    Pelvic pain, fibroid, heavy periods - referral back to gynecology for further management.  I reviewed her last pap from 2013 here with me which was normal, reviewed prior ultrasound of pelvis.  Follow up: pending referrals

## 2012-12-12 ENCOUNTER — Telehealth: Payer: Self-pay | Admitting: Internal Medicine

## 2012-12-12 DIAGNOSIS — R110 Nausea: Secondary | ICD-10-CM

## 2012-12-12 DIAGNOSIS — R109 Unspecified abdominal pain: Secondary | ICD-10-CM

## 2012-12-12 DIAGNOSIS — Z1211 Encounter for screening for malignant neoplasm of colon: Secondary | ICD-10-CM

## 2012-12-12 DIAGNOSIS — K2970 Gastritis, unspecified, without bleeding: Secondary | ICD-10-CM

## 2012-12-12 DIAGNOSIS — N920 Excessive and frequent menstruation with regular cycle: Secondary | ICD-10-CM

## 2012-12-12 NOTE — Telephone Encounter (Signed)
Message copied by Joslyn Hy on Thu Dec 12, 2012 10:06 AM ------      Message from: Jac Canavan      Created: Wed Dec 11, 2012 11:04 PM       1) refer back to GI/Dr. Elnoria Howard for recheck on persistent/chronic nausea, abdominal bloating, GERD, gastritis, and first screening colonoscopy.  She is wanting FMLA redone for abdominal pain, but I need Dr. Haywood Pao input before I can do this or make decision on this.             2) refer to gynecology (check with her for Dr. Juliene Pina vs a different gynecologist) for recheck on pelvic pain, heavy periods. ------

## 2012-12-13 NOTE — Telephone Encounter (Signed)
Pt must call Dr. Elnoria Howard office before getting an appt set up. i have also sent over a referral to [facility] for them to schedule pt

## 2012-12-13 NOTE — Telephone Encounter (Signed)
The Numbers we have for pt are not working and i have no way of getting in touch with pt before scheduling appt. i will send a letter stating she needs to call the office and give an updated number

## 2012-12-27 ENCOUNTER — Telehealth: Payer: Self-pay | Admitting: Medical

## 2012-12-27 NOTE — Telephone Encounter (Signed)
If she has an FMLA already in focus it should cover this. If not she needs to get it covered by whomever gave her the original FMLA

## 2012-12-27 NOTE — Telephone Encounter (Signed)
You forwarded the msg to Dr. Susann Givens that we discussed correct?

## 2012-12-27 NOTE — Telephone Encounter (Signed)
Pt will FU with HR at her job

## 2012-12-27 NOTE — Telephone Encounter (Signed)
Dr Susann Givens you excused pt on 8/6 & 8/7.  Can you help her with this?

## 2012-12-31 ENCOUNTER — Encounter: Payer: Self-pay | Admitting: Family Medicine

## 2012-12-31 ENCOUNTER — Telehealth: Payer: Self-pay | Admitting: Internal Medicine

## 2012-12-31 NOTE — Telephone Encounter (Signed)
Pt states that 8/4-8/5 is on FLMA but they are requiring her to have an additional letter stating that she was out on 8/4-8/5.

## 2012-12-31 NOTE — Telephone Encounter (Signed)
This doesn't make any sense. If she has an FMLA in effect it should cover for those 2 days. If she needs a note have it state that she missed work on those 2 days because of abdominal pain but do not state that I told her to stay out of work

## 2012-12-31 NOTE — Telephone Encounter (Signed)
Letter sent to pt

## 2013-01-06 ENCOUNTER — Telehealth: Payer: Self-pay | Admitting: Family Medicine

## 2013-01-06 NOTE — Telephone Encounter (Signed)
Rcd fax request from [employer] HR dept [phone] .  I called Corrie Dandy and reached her voice mail. LMTRC

## 2013-01-06 NOTE — Telephone Encounter (Signed)
Lisa Briggs with [employer] called to clarify the out of work note.  I explained the chart does show the episode began 2 days ago.

## 2013-01-08 ENCOUNTER — Other Ambulatory Visit: Payer: Self-pay

## 2013-01-08 DIAGNOSIS — Z1231 Encounter for screening mammogram for malignant neoplasm of breast: Secondary | ICD-10-CM

## 2013-01-13 ENCOUNTER — Encounter: Payer: BC Managed Care – PPO | Admitting: Obstetrics and Gynecology

## 2013-01-14 ENCOUNTER — Telehealth: Payer: Self-pay | Admitting: Medical

## 2013-01-14 MED ORDER — HYDROCHLOROTHIAZIDE 25 MG PO TABS: 25.0000 mg | ORAL_TABLET | Freq: Every day | ORAL | Status: DC

## 2013-01-14 NOTE — Telephone Encounter (Signed)
done

## 2013-01-14 NOTE — Telephone Encounter (Signed)
Medication refill sent to her pharmacy. CLS 

## 2013-01-20 ENCOUNTER — Encounter: Payer: Self-pay | Admitting: Medical

## 2013-01-20 ENCOUNTER — Ambulatory Visit (INDEPENDENT_AMBULATORY_CARE_PROVIDER_SITE_OTHER): Payer: BC Managed Care – PPO | Admitting: Medical

## 2013-01-20 ENCOUNTER — Telehealth: Payer: Self-pay | Admitting: Medical

## 2013-01-20 ENCOUNTER — Other Ambulatory Visit (HOSPITAL_COMMUNITY)
Admission: RE | Admit: 2013-01-20 | Discharge: 2013-01-20 | Disposition: A | Discharge status: Home / Self Care | Payer: BC Managed Care – PPO | Source: Ambulatory Visit | Attending: Medical | Admitting: Medical

## 2013-01-20 VITALS — BP 120/70 | HR 80 | Temp 98.5°F | Resp 16 | Ht 68.2 in | Wt 202.0 lb

## 2013-01-20 DIAGNOSIS — R110 Nausea: Secondary | ICD-10-CM

## 2013-01-20 DIAGNOSIS — E669 Obesity, unspecified: Secondary | ICD-10-CM

## 2013-01-20 DIAGNOSIS — Z0000 Encounter for general adult medical examination without abnormal findings: Secondary | ICD-10-CM

## 2013-01-20 DIAGNOSIS — Z1151 Encounter for screening for human papillomavirus (HPV): Secondary | ICD-10-CM | POA: Insufficient documentation

## 2013-01-20 DIAGNOSIS — Z124 Encounter for screening for malignant neoplasm of cervix: Secondary | ICD-10-CM

## 2013-01-20 DIAGNOSIS — R6889 Other general symptoms and signs: Secondary | ICD-10-CM

## 2013-01-20 DIAGNOSIS — Z01419 Encounter for gynecological examination (general) (routine) without abnormal findings: Secondary | ICD-10-CM | POA: Insufficient documentation

## 2013-01-20 DIAGNOSIS — D649 Anemia, unspecified: Secondary | ICD-10-CM

## 2013-01-20 DIAGNOSIS — Z1239 Encounter for other screening for malignant neoplasm of breast: Secondary | ICD-10-CM

## 2013-01-20 DIAGNOSIS — M25551 Pain in right hip: Secondary | ICD-10-CM

## 2013-01-20 DIAGNOSIS — Z1211 Encounter for screening for malignant neoplasm of colon: Secondary | ICD-10-CM

## 2013-01-20 DIAGNOSIS — M25559 Pain in unspecified hip: Secondary | ICD-10-CM

## 2013-01-20 DIAGNOSIS — I10 Essential (primary) hypertension: Secondary | ICD-10-CM

## 2013-01-20 DIAGNOSIS — IMO0002 Reserved for concepts with insufficient information to code with codable children: Secondary | ICD-10-CM

## 2013-01-20 DIAGNOSIS — B977 Papillomavirus as the cause of diseases classified elsewhere: Secondary | ICD-10-CM

## 2013-01-20 DIAGNOSIS — K219 Gastro-esophageal reflux disease without esophagitis: Secondary | ICD-10-CM

## 2013-01-20 LAB — POCT URINALYSIS DIPSTICK
Bilirubin, UA: NEGATIVE
Blood, UA: NEGATIVE
Glucose, UA: NEGATIVE
Ketones, UA: NEGATIVE
Leukocytes, UA: NEGATIVE
Nitrite, UA: NEGATIVE
Protein, UA: NEGATIVE
Spec Grav, UA: 1.015
Urobilinogen, UA: NEGATIVE
pH, UA: 7

## 2013-01-20 LAB — CBC
HCT: 38.1 % (ref 36.0–46.0)
Hemoglobin: 12.3 g/dL (ref 12.0–15.0)
MCH: 26.9 pg (ref 26.0–34.0)
MCHC: 32.3 g/dL (ref 30.0–36.0)
MCV: 83.4 fL (ref 78.0–100.0)
Platelets: 351 10*3/uL (ref 150–400)
RBC: 4.57 MIL/uL (ref 3.87–5.11)
RDW: 14.2 % (ref 11.5–15.5)
WBC: 7.3 10*3/uL (ref 4.0–10.5)

## 2013-01-20 LAB — COMPREHENSIVE METABOLIC PANEL
ALT: 11 U/L (ref 0–35)
AST: 13 U/L (ref 0–37)
Albumin: 4.1 g/dL (ref 3.5–5.2)
Alkaline Phosphatase: 86 U/L (ref 39–117)
BUN: 15 mg/dL (ref 6–23)
CO2: 31 mEq/L (ref 19–32)
Calcium: 9.7 mg/dL (ref 8.4–10.5)
Chloride: 103 mEq/L (ref 96–112)
Creat: 0.63 mg/dL (ref 0.50–1.10)
Glucose, Bld: 86 mg/dL (ref 70–99)
Potassium: 3.7 mEq/L (ref 3.5–5.3)
Sodium: 140 mEq/L (ref 135–145)
Total Bilirubin: 0.5 mg/dL (ref 0.3–1.2)
Total Protein: 7 g/dL (ref 6.0–8.3)

## 2013-01-20 LAB — LIPID PANEL
Cholesterol: 166 mg/dL (ref 0–200)
HDL: 70 mg/dL (ref >39)
LDL Cholesterol: 85 mg/dL (ref 0–99)
Total CHOL/HDL Ratio: 2.4 Ratio
Triglycerides: 56 mg/dL (ref <150)
VLDL: 11 mg/dL (ref 0–40)

## 2013-01-20 NOTE — Progress Notes (Signed)
Subjective:   HPI  Lisa Briggs is a 52 y.o. female who presents for a complete physical.  Medical care team includes:  Dr. Elnoria Howard, gastroenterology  Podiatry, [facility]   Preventative care: Last ophthalmology visit:YES- DR. MCFARLAND Last dental visit:YES-DR.DRAWBACK  Last colonoscopy:NOT YET Last mammogram:02/12/13 Last gynecological exam:TODAY Last EKG:03/06/11 Last labs:2013  Prior vaccinations: TD or Tdap:01/17/2011 Influenza:WILL GET AT WORK Pneumococcal:N/A Shingles/Zostavax:N/A  Advanced directive:N/A Health care power of attorney:N/A Living will:N/A  Concerns: Currently having right hip pain.  Does a lot of physical activity at work.  Sometimes lifting and pushing things at work aggravates the pain.  Gets some swelling of right knee.  Uses ice when painful.  Was told she arthritis.  Sees orthopedist about.   Has had xrays.    Reviewed their medical, surgical, family, social, medication, and allergy history and updated chart as appropriate.  Past Medical History  Diagnosis Date  . Hypertension   . GERD (gastroesophageal reflux disease)   . Allergy   . Farsightedness     wears glasses, [facility]  . Anemia     iron therapy for years as of 10/12  . History of uterine fibroid   . Chronic back pain   . Polyarthralgia     normal rheumatoid screen 01/2012  . Constipation     Past Surgical History  Procedure Laterality Date  . Lipoma excision      forehead  . Knee arthroscopy      left  . Uterine fibroid surgery    . Gall stone surgery    . Colonoscopy      never, pending for 03/2013  . Esophagogastroduodenoscopy  2013    Dr. Elnoria Howard, gastritis    History   Social History  . Marital Status: Married    Spouse Name: N/A    Number of Children: N/A  . Years of Education: N/A   Occupational History  . Location manager    Social History Main Topics  . Smoking status: Never Smoker   . Smokeless tobacco: Never Used  . Alcohol Use: No  .  Drug Use: No  . Sexual Activity: Not on file   Other Topics Concern  . Not on file   Social History Narrative   Married, 30yo son.  Location manager.  Active on job.  Does stretching and exercises daily as per physical therapy.  Works 12 hours daily.      Family History  Problem Relation Age of Onset  . Hypertension Mother   . Arthritis Mother   . GER disease Mother   . Glaucoma Mother   . Stroke Father   . Alcohol abuse Father   . Cancer Sister     breast cancer dx late 34s; another sister with leukemia  . Hypertension Sister   . Heart disease Sister     heart murmur; other sisters with heart problems  . Diabetes Brother   . Diabetes Maternal Aunt   . Heart disease Maternal Aunt   . Heart disease Maternal Grandmother   . Heart disease Maternal Grandfather   . Heart disease Maternal Uncle   . Stroke Maternal Uncle     Current outpatient prescriptions:dexlansoprazole (DEXILANT) 60 MG capsule, Take 60 mg by mouth daily., Disp: , Rfl: ;  fluticasone (FLONASE) 50 MCG/ACT nasal spray, USE ONE TO TWO SPRAY IN EACH NOSTRIL TWICE DAILY FOR ONE WEEK, THEN USE ONCE DAILY AS NEEDED, Disp: 16 g, Rfl: 0;  hydrochlorothiazide (HYDRODIURIL) 25 MG tablet, Take 1  tablet (25 mg total) by mouth daily., Disp: 90 tablet, Rfl: 1 loratadine (CLARITIN) 10 MG tablet, Take 1 tablet (10 mg total) by mouth daily., Disp: 90 tablet, Rfl: 1;  ondansetron (ZOFRAN) 4 MG tablet, Take 1 tablet (4 mg total) by mouth every 8 (eight) hours as needed for nausea., Disp: 20 tablet, Rfl: 0;  polyethylene glycol (MIRALAX / GLYCOLAX) packet, Take 17 g by mouth daily., Disp: , Rfl: ;  Ferrous Gluconate 325 (36 FE) MG TABS, Take 1 tablet by mouth 2 (two) times daily., Disp: 60 tablet, Rfl: 2  No Known Allergies  Review of Systems Constitutional: -fever, -chills, -sweats, -unexpected weight change, -decreased appetite, +fatigue Allergy: -sneezing, -itching, -congestion Dermatology: -changing moles, --rash, -lumps ENT:  -runny nose, -ear pain, -sore throat, -hoarseness, -sinus pain, -teeth pain, - ringing in ears, -hearing loss, -nosebleeds Cardiology: -chest pain, -palpitations, -swelling, -difficulty breathing when lying flat, -waking up short of breath Respiratory: -cough, -shortness of breath, -difficulty breathing with exercise or exertion, -wheezing, -coughing up blood Gastroenterology: -abdominal pain, -nausea, -vomiting, -diarrhea, -constipation, -blood in stool, -changes in bowel movement, -difficulty swallowing or eating Hematology: -bleeding, -bruising  Musculoskeletal: -joint aches, -muscle aches, -joint swelling, -back pain, -neck pain, +cramping, -changes in gait Ophthalmology: denies vision changes, eye redness, itching, discharge Urology: -burning with urination, -difficulty urinating, -blood in urine, -urinary frequency, -urgency, -incontinence Neurology: -headache, -weakness, -tingling, -numbness, -memory loss, -falls, -dizziness Psychology: -depressed mood, -agitation, -sleep problems     Objective:   Physical Exam BP 120/70  Pulse 80  Temp(Src) 98.5 F (36.9 C) (Oral)  Resp 16  Ht 5' 8.2" (1.732 m)  Wt 202 lb (91.627 kg)  BMI 30.54 kg/m2   General appearance: alert, no distress, WD/WN, AA female Skin: right lower abdomen, inferior to umbilicus with 6mm x 4mm brown flat macules, somewhat triangular shaped, but well defined, uniform color.  Few other scattered benign appearing macules.  No other worrisome lesions HEENT: normocephalic, conjunctiva/corneas normal, sclerae anicteric, PERRLA, EOMi, nares patent, no discharge or erythema, pharynx normal Oral cavity: MMM, tongue normal, teeth in good repair Neck: supple, no lymphadenopathy, no thyromegaly, no masses, normal ROM, no bruits Chest: non tender, normal shape and expansion Heart: RRR, normal S1, S2, no murmurs Lungs: CTA bilaterally, no wheezes, rhonchi, or rales Abdomen: +bs, soft, mild right sided tenderness, otherwise non  tender, non distended, no masses, no hepatomegaly, no splenomegaly, no bruits Back: non tender, normal ROM, no scoliosis Musculoskeletal: upper extremities non tender, no obvious deformity, normal ROM throughout, lower extremities non tender, no obvious deformity, normal ROM throughout Extremities: no edema, no cyanosis, no clubbing Pulses: 2+ symmetric, upper and lower extremities, normal cap refill Neurological: alert, oriented x 3, CN2-12 intact, strength normal upper extremities and lower extremities, sensation normal throughout, DTRs 2+ throughout, no cerebellar signs, gait normal Psychiatric: normal affect, behavior normal, pleasant  Breast: nontender, no masses or lumps, no skin changes, no nipple discharge or inversion, no axillary lymphadenopathy Gyn: Normal external genitalia without lesions, vagina with normal mucosa, cervix without lesions, no cervical motion tenderness, no abnormal vaginal discharge.  Tenderness of the right adnexal region, otherwise Uterus and adnexa not enlarged, no masses.  Pap performed.  Exam chaperoned by nurse. Rectal: deferred to GI   Assessment and Plan :    Encounter Diagnoses  Name Primary?  . Routine general medical examination at a health care facility Yes  . HPV test positive   . Screening for breast cancer   . Screening for cervical cancer   .  Anemia   . Chronic nausea   . GERD (gastroesophageal reflux disease)   . Unspecified essential hypertension   . Obesity, unspecified   . Hip pain, acute, right   . Special screening for malignant neoplasms, colon     Physical exam - discussed healthy lifestyle, diet, exercise, preventative care, vaccinations, and addressed their concerns.  Handout given.  She will get a flu shot at work. HPV + on 2013 pap. Repeat pap today. She has mammogram already scheduled. Anemia - repeat CBC today.  She reports prior good luck with Hemanitic Plus iron.  Didn't tolerate ferrous gluconate. Chronic nausea - seems  to be doing much better on Dexilant and Zofran. GERD - c/t Dexilant, trigger avoidance HTN - controlled on current medication.   Glad to see she has lost some weight.  Obesity - c/t to work on weight loss efforts Hip pain - has f/u planned with ortho Referral for first screening colonoscopy Follow-up pending studies

## 2013-01-20 NOTE — Telephone Encounter (Signed)
I will take care when her lab results come in. CLS

## 2013-01-23 ENCOUNTER — Encounter: Payer: Self-pay | Admitting: Internal Medicine

## 2013-01-24 ENCOUNTER — Encounter: Payer: Self-pay | Admitting: Family Medicine

## 2013-01-30 ENCOUNTER — Telehealth: Payer: Self-pay | Admitting: Internal Medicine

## 2013-01-30 ENCOUNTER — Other Ambulatory Visit: Payer: Self-pay | Admitting: Medical

## 2013-01-30 MED ORDER — FERROUS FUMARATE 325 (106 FE) MG PO TABS: 1.0000 | ORAL_TABLET | Freq: Two times a day (BID) | ORAL | Status: DC

## 2013-01-30 NOTE — Telephone Encounter (Signed)
Pt informed

## 2013-01-30 NOTE — Telephone Encounter (Signed)
done

## 2013-01-30 NOTE — Telephone Encounter (Signed)
Pt needs hemocyte 325mg  send to [facility] on [address]. She used to take this and wants to get back on this. She does not want to take the other iron you put her on. Call and advise pt

## 2013-02-03 ENCOUNTER — Encounter: Payer: Self-pay | Admitting: Medical

## 2013-02-12 ENCOUNTER — Ambulatory Visit
Admission: RE | Admit: 2013-02-12 | Discharge: 2013-02-12 | Disposition: A | Discharge status: Home / Self Care | Payer: BC Managed Care – PPO | Source: Ambulatory Visit

## 2013-02-12 DIAGNOSIS — Z1231 Encounter for screening mammogram for malignant neoplasm of breast: Secondary | ICD-10-CM

## 2013-02-14 ENCOUNTER — Encounter: Payer: Self-pay | Admitting: Family Medicine

## 2013-02-26 ENCOUNTER — Telehealth: Payer: Self-pay | Admitting: Family Medicine

## 2013-02-26 NOTE — Telephone Encounter (Signed)
LM

## 2013-03-07 ENCOUNTER — Encounter: Payer: Self-pay | Admitting: Medical

## 2013-03-07 ENCOUNTER — Ambulatory Visit (INDEPENDENT_AMBULATORY_CARE_PROVIDER_SITE_OTHER): Payer: BC Managed Care – PPO | Admitting: Medical

## 2013-03-07 VITALS — BP 112/80 | HR 64 | Temp 97.4°F | Resp 16 | Wt 201.0 lb

## 2013-03-07 DIAGNOSIS — R079 Chest pain, unspecified: Secondary | ICD-10-CM

## 2013-03-07 DIAGNOSIS — J069 Acute upper respiratory infection, unspecified: Secondary | ICD-10-CM

## 2013-03-07 DIAGNOSIS — R0789 Other chest pain: Secondary | ICD-10-CM

## 2013-03-07 DIAGNOSIS — M62838 Other muscle spasm: Secondary | ICD-10-CM

## 2013-03-07 DIAGNOSIS — R071 Chest pain on breathing: Secondary | ICD-10-CM

## 2013-03-07 MED ORDER — CYCLOBENZAPRINE HCL 10 MG PO TABS: ORAL_TABLET | ORAL | Status: DC

## 2013-03-07 MED ORDER — TRAMADOL HCL 50 MG PO TABS: 50.0000 mg | ORAL_TABLET | Freq: Four times a day (QID) | ORAL | Status: DC | PRN

## 2013-03-07 NOTE — Patient Instructions (Signed)
You appear to have a cold/viral upper respiratory infection.   Your back and chest symptoms seem muscle related/spasm .   Begin Advil 200mg  OTC, 2-3 tablets twice daily for pain and inflammation  Begin Ultram as needed,1 tablet every 6 hours for pain.  Use Flexeril muscle relaxer at bedtime for spasm.    This will make you sleepy.  Upper Respiratory Infection, Adult An upper respiratory infection (URI) is also known as the common cold. It is often caused by a type of germ (virus). Colds are easily spread (contagious). You can pass it to others by kissing, coughing, sneezing, or drinking out of the same glass. Usually, you get better in 1 or 2 weeks.  HOME CARE   Only take medicine as told by your doctor.   Use a warm mist humidifier or breathe in steam from a hot shower.   Drink enough water and fluids to keep your pee (urine) clear or pale yellow.   Get plenty of rest.   Return to work when your temperature is back to normal or as told by your doctor. You may use a face mask and wash your hands to stop your cold from spreading.  GET HELP RIGHT AWAY IF:   After the first few days, you feel you are getting worse.   You have questions about your medicine.   You have chills, shortness of breath, or brown or red spit (mucus).   You have yellow or brown snot (nasal discharge) or pain in the face, especially when you bend forward.   You have a fever, puffy (swollen) neck, pain when you swallow, or white spots in the back of your throat.   You have a bad headache, ear pain, sinus pain, or chest pain.   You have a high-pitched whistling sound when you breathe in and out (wheezing).   You have a lasting cough or cough up blood.   You have sore muscles or a stiff neck.  MAKE SURE YOU:   Understand these instructions.   Will watch your condition.   Will get help right away if you are not doing well or get worse.  Document Released: 09/06/2007 Document Revised: 11/30/2010 Document  Reviewed: 07/25/2010 [facility] Patient Information 2012 [place], [place].

## 2013-03-07 NOTE — Progress Notes (Signed)
  Subjective:  Lisa Briggs is a 52 y.o. female who presents for chest pain. For the last several days she has been getting pains mainly in her right upper chest and shoulder and upper back. This is worsened by activity and movement,. She uses daily stretching routine, but hasn't taken anything for the symptoms. She denies associated left-sided chest pain, central chest pain, nausea, vomiting, shortness of breath, DOE, edema, no fever, no rash, no sweats. No recent fall, trauma or injury.  No other aggravating or relieving factors.    No other c/o.  The following portions of the patient's history were reviewed and updated as appropriate: allergies, current medications, past family history, past medical history, past social history, past surgical history and problem list.  ROS Otherwise as in subjective above  Objective: Physical Exam  BP 112/80  Pulse 64  Temp(Src) 97.4 F (36.3 C) (Oral)  Resp 16  Wt 201 lb (91.173 kg)   General appearance: alert, no distress, WD/WN Oral cavity: MMM, no lesions Neck: supple, no lymphadenopathy, no thyromegaly, no masses, no JVD Chest wall with tenderness of right upper chest and right upper back, positive spasm of right upper back Heart: RRR, normal S1, S2, no murmurs Lungs: CTA bilaterally, no wheezes, rhonchi, or rales Abdomen: +bs, soft, non tender, non distended, no masses, no hepatomegaly, no splenomegaly MSK: Mild pain with right shoulder range of motion, otherwise normal range of motion, nontender to palpation of arm, no swelling, no obvious deformity Pulses: 2+ radial pulses, 2+ pedal pulses, normal cap refill Ext: no edema   Adult ECG Report  Indication: chest pain  Rate: 66 bpm  Rhythm: normal sinus rhythm  QRS Axis: 53 degrees  PR Interval:  QRS Duration: 94ms  QTc:  Conduction Disturbances: none  Other Abnormalities: questionable voltage criteria for LVH, T  wave inversion V2  Patient's cardiac risk factors are: hypertension.  EKG comparison: none  Narrative Interpretation: nonspecific T wave abnormality, questionable LVH per voltage, otherwise no acute change     Assessment: Encounter Diagnoses  Name Primary?  . Chest pain Yes  . Chest wall pain   . Upper respiratory infection   . Muscle spasm      Plan: Nurse performed EKG prior to me seeing the patient. No obvious worrisome signs on EKG. Of note she did see cardiology in December year ago with normal stress test.  Her chest pain today appears to be musculoskeletal. She does manual labor at work, seems to have aggravated her right shoulder girdle and chest wall.  Nothing appears to be cardiac at this time based on symptoms and exam. Advise relative rest, continue daily stretching routine, can use Flexeril when necessary for spasm over the next 2 days, Ultram as needed for pain, over-the-counter Advil twice a day for the next few days. Call or return if not improving.  URI-discussed supportive care, call or return if not improving  Follow up: prn

## 2013-04-04 ENCOUNTER — Telehealth: Payer: Self-pay | Admitting: Internal Medicine

## 2013-04-04 ENCOUNTER — Other Ambulatory Visit: Payer: Self-pay | Admitting: Family Medicine

## 2013-04-04 MED ORDER — POLYETHYLENE GLYCOL 3350 17 G PO PACK: 17.0000 g | PACK | Freq: Every day | ORAL | Status: DC

## 2013-04-04 NOTE — Telephone Encounter (Signed)
Pt needs a refill on miralax packet to [facility] on [address]

## 2013-04-04 NOTE — Telephone Encounter (Signed)
pls send refill

## 2013-04-04 NOTE — Telephone Encounter (Signed)
I sent her refills to her pharmacy. CLS

## 2013-04-11 ENCOUNTER — Encounter: Payer: Self-pay | Admitting: Medical

## 2013-04-14 ENCOUNTER — Telehealth: Payer: Self-pay | Admitting: Medical

## 2013-04-14 ENCOUNTER — Other Ambulatory Visit: Payer: Self-pay | Admitting: Medical

## 2013-04-14 MED ORDER — POLYETHYLENE GLYCOL 3350 17 GM/SCOOP PO POWD: 1.0000 | Freq: Once | ORAL | Status: DC

## 2013-04-14 NOTE — Telephone Encounter (Signed)
rx sent, have her verify with pharmacist that it is what she was taking prior before paying

## 2013-04-14 NOTE — Telephone Encounter (Signed)
Rx for Miralax was sent in last week was for "packages" but they don't have in stock, she states she normally just gets the bottle and would like that sent to pharmacy

## 2013-04-15 ENCOUNTER — Ambulatory Visit: Payer: BC Managed Care – PPO

## 2013-04-15 ENCOUNTER — Telehealth: Payer: Self-pay | Admitting: Family Medicine

## 2013-04-15 MED ORDER — POLYETHYLENE GLYCOL 3350 17 GM/SCOOP PO POWD: 1.0000 | Freq: Once | ORAL | Status: DC

## 2013-04-15 NOTE — Telephone Encounter (Signed)
Made a change to the patients medication. CLS

## 2013-04-16 ENCOUNTER — Other Ambulatory Visit: Payer: Self-pay | Admitting: Medical

## 2013-04-25 ENCOUNTER — Telehealth: Payer: Self-pay | Admitting: Medical

## 2013-04-25 ENCOUNTER — Ambulatory Visit (INDEPENDENT_AMBULATORY_CARE_PROVIDER_SITE_OTHER): Payer: BC Managed Care – PPO

## 2013-04-25 VITALS — BP 106/62 | HR 68 | Resp 16

## 2013-04-25 DIAGNOSIS — M79609 Pain in unspecified limb: Secondary | ICD-10-CM

## 2013-04-25 DIAGNOSIS — M722 Plantar fascial fibromatosis: Secondary | ICD-10-CM

## 2013-04-25 DIAGNOSIS — M792 Neuralgia and neuritis, unspecified: Secondary | ICD-10-CM

## 2013-04-25 DIAGNOSIS — IMO0002 Reserved for concepts with insufficient information to code with codable children: Secondary | ICD-10-CM

## 2013-04-25 MED ORDER — PREDNISONE 10 MG PO KIT: PACK | ORAL | Status: DC

## 2013-04-25 NOTE — Progress Notes (Signed)
   Subjective:    Patient ID: Lisa Briggs, female    DOB: 1960/12/03, 53 y.o.   MRN: [id number]  HPI Comments: "My right foot still hurts in the arch."  Foot Pain   patient continues to have tenderness on palpation palpation mid band plantar fascia right arch most significant first step in the morning or getting up in period of rest she been wearing her power step insoles as instructed is doing better but not completely resolved.     Review of Systems no new changes or findings of systems reviewed and noted to be unremarkable. Patient does have some arthrosis in her back and lumbar spine with a slight radiculopathy indicating some paresthesia in shooting pain or sciatic symptoms     Objective:   Physical Exam Or. Objective findings as follows neurovascular status is intact pedal pulses palpable DP postal for PT +2/4 bilateral Refill time 3 seconds all digits skin temperature warm turgor normal no edema rubor pallor or varicosities noted. Neurologically epicritic and proprioceptive sensations intact and symmetric there's some paresthesias affecting her right side leg and down to the foot. These are not constant but only occasional orthopedic biomechanical exam there is pain on palpation of the mid band of the plantar fascia. There is an x-ray no signs of fracture or osseous abnormality mild promontory changes noted some inferior calcaneal spurring noted. No fractures or cysts noted dermatologically skin color pigment and hair growth normal no other findings identified at this time. Should note patient apparently is been taking some Advil or ibuprofen along with her meloxicam or Mobic they may be a shear. Degenerative times. Advised to take Mobic only and not to take other NSAIDs if needed plain Tylenol may be utilized.       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 Also at this time prescribed a Sterapred Deas Dosepak x6 days take as instructed in a tapering dose to reappointed in one to 2 months for followup and reevaluation as needed  Harriet Masson DPM

## 2013-04-25 NOTE — Telephone Encounter (Signed)
Lisa Briggs called to inform you that patient cancelled her appointment

## 2013-04-25 NOTE — Telephone Encounter (Signed)
Why?  

## 2013-04-25 NOTE — Patient Instructions (Signed)

## 2013-04-27 ENCOUNTER — Other Ambulatory Visit: Payer: Self-pay

## 2013-06-02 ENCOUNTER — Other Ambulatory Visit: Payer: Self-pay

## 2013-06-05 ENCOUNTER — Other Ambulatory Visit: Payer: Self-pay | Admitting: Medical

## 2013-06-26 ENCOUNTER — Other Ambulatory Visit: Payer: Self-pay | Admitting: Medical

## 2013-06-26 NOTE — Telephone Encounter (Signed)
Medication sent in. 

## 2013-06-29 ENCOUNTER — Other Ambulatory Visit: Payer: Self-pay | Admitting: Medical

## 2013-06-30 ENCOUNTER — Ambulatory Visit (INDEPENDENT_AMBULATORY_CARE_PROVIDER_SITE_OTHER): Payer: BC Managed Care – PPO | Admitting: Medical

## 2013-06-30 ENCOUNTER — Encounter: Payer: Self-pay | Admitting: Medical

## 2013-06-30 ENCOUNTER — Ambulatory Visit
Admission: RE | Admit: 2013-06-30 | Discharge: 2013-06-30 | Disposition: A | Discharge status: Home / Self Care | Payer: BC Managed Care – PPO | Source: Ambulatory Visit | Attending: Medical | Admitting: Medical

## 2013-06-30 ENCOUNTER — Other Ambulatory Visit: Payer: Self-pay | Admitting: Medical

## 2013-06-30 VITALS — BP 112/80 | HR 78 | Temp 98.1°F | Resp 14 | Wt 208.0 lb

## 2013-06-30 DIAGNOSIS — M25559 Pain in unspecified hip: Secondary | ICD-10-CM

## 2013-06-30 DIAGNOSIS — M25551 Pain in right hip: Secondary | ICD-10-CM

## 2013-06-30 DIAGNOSIS — M549 Dorsalgia, unspecified: Secondary | ICD-10-CM

## 2013-06-30 MED ORDER — CYCLOBENZAPRINE HCL 10 MG PO TABS: ORAL_TABLET | ORAL | Status: DC

## 2013-06-30 MED ORDER — MELOXICAM 15 MG PO TABS: 15.0000 mg | ORAL_TABLET | Freq: Every day | ORAL | Status: DC

## 2013-06-30 NOTE — Progress Notes (Addendum)
Subjective: Subjective:    Lisa Briggs is a 53 y.o. female who presents for back and hip pain.  She does have hx/o back problems.  I sent her to physical therapy last year for back pain.  She started having pain in right hip x 2 weeks, but swollen last week.  Has used topical horse liniment and has tried ice and heat over the weekend.  Back pain started flaring up 2 wk ago too.  Denies recent fall, injury or trauma.  Pushes heavy bins at work.   Using either ibuprofen or Mobic OTC.  Occasionally feels some numbness in lateral right thigh.  Denies  tingling, or weakness.   No fever. No incontinence.   The following portions of the patient's history were reviewed and updated as appropriate: allergies, current medications, past family history, past medical history, past social history, past surgical history and problem list.  Review of Systems Constitutional: denies fever, chills, sweats, unexpected weight change, anorexia, fatigue Allergy: negative; denies recent sneezing, itching, congestion Dermatology: +bruise over right hip this past week.  Denies changing moles, rash, lumps, new worrisome lesions Cardiology: denies chest pain, palpitations, edema, orthopnea, paroxysmal nocturnal dyspnea Respiratory: denies cough, shortness of breath, dyspnea on exertion, wheezing, hemoptysis Gastroenterology: denies abdominal pain, nausea, vomiting, diarrhea, constipation, blood in stool, changes in bowel movement, dysphagia Urology: denies dysuria, difficulty urinating, hematuria, urinary frequency, urgency, incontinence Neurology: no headache, weakness, tingling     Objective:     Filed Vitals:   06/30/13 0810  BP: 112/80  Pulse: 78  Temp: 98.1 F (36.7 C)  Resp: 14    General appearance: alert, no distress, WD/WN, female  Abdomen: +bs, soft, non tender, non distended, no masses, no hepatomegaly, no splenomegaly, no bruits Back: generalized lumbar tenderness, slow back ROM generalized and in  pain.   Musculoskeletal: tender over right upper lateral thigh and hip, mild pain with external ROM of both hips, otherwise rest of LE unremarkable. Extremities: right upper thigh laterally with puffiness but no other obvious edema, no cyanosis, no clubbing Pulses: 2+ symmetric, upper and lower extremities, normal cap refill Neurological: LE normal strength, sensation, normal heel and toe walk.  -SLR  Normal DTRs   Assessment:      Encounter Diagnoses  Name Primary?  . Back pain Yes  . Hip pain, right     Plan:   no obvious recent mechanism of injury other than heavy pushing at work which is ongoing.   she has seen me for back issues prior.  We will send for hip and updated back xray today to see if any changes.   reviewed lumbar xray from prior in chart.  F/u pending xrays.  Discussed continuing her usual stretching and home exercises, Ibuprofen OTC, relative rest.

## 2013-08-04 ENCOUNTER — Emergency Department (HOSPITAL_COMMUNITY): Payer: BC Managed Care – PPO

## 2013-08-04 ENCOUNTER — Emergency Department (HOSPITAL_COMMUNITY)
Admission: EM | Admit: 2013-08-04 | Discharge: 2013-08-04 | Disposition: A | Discharge status: Home / Self Care | Payer: BC Managed Care – PPO | Attending: Emergency Medicine | Admitting: Emergency Medicine

## 2013-08-04 ENCOUNTER — Encounter (HOSPITAL_COMMUNITY): Payer: Self-pay | Admitting: Emergency Medicine

## 2013-08-04 DIAGNOSIS — R5383 Other fatigue: Principal | ICD-10-CM

## 2013-08-04 DIAGNOSIS — D649 Anemia, unspecified: Secondary | ICD-10-CM | POA: Insufficient documentation

## 2013-08-04 DIAGNOSIS — IMO0002 Reserved for concepts with insufficient information to code with codable children: Secondary | ICD-10-CM | POA: Insufficient documentation

## 2013-08-04 DIAGNOSIS — R5381 Other malaise: Secondary | ICD-10-CM | POA: Insufficient documentation

## 2013-08-04 DIAGNOSIS — R911 Solitary pulmonary nodule: Secondary | ICD-10-CM | POA: Insufficient documentation

## 2013-08-04 DIAGNOSIS — Z8669 Personal history of other diseases of the nervous system and sense organs: Secondary | ICD-10-CM | POA: Insufficient documentation

## 2013-08-04 DIAGNOSIS — R531 Weakness: Secondary | ICD-10-CM

## 2013-08-04 DIAGNOSIS — K5900 Constipation, unspecified: Secondary | ICD-10-CM | POA: Insufficient documentation

## 2013-08-04 DIAGNOSIS — K219 Gastro-esophageal reflux disease without esophagitis: Secondary | ICD-10-CM | POA: Insufficient documentation

## 2013-08-04 DIAGNOSIS — Z8739 Personal history of other diseases of the musculoskeletal system and connective tissue: Secondary | ICD-10-CM | POA: Insufficient documentation

## 2013-08-04 DIAGNOSIS — Z791 Long term (current) use of non-steroidal anti-inflammatories (NSAID): Secondary | ICD-10-CM | POA: Insufficient documentation

## 2013-08-04 DIAGNOSIS — G8929 Other chronic pain: Secondary | ICD-10-CM | POA: Insufficient documentation

## 2013-08-04 DIAGNOSIS — I10 Essential (primary) hypertension: Secondary | ICD-10-CM | POA: Insufficient documentation

## 2013-08-04 LAB — CBC WITH DIFFERENTIAL/PLATELET
Basophils Absolute: 0 10*3/uL (ref 0.0–0.1)
Basophils Relative: 1 % (ref 0–1)
Eosinophils Absolute: 0.3 10*3/uL (ref 0.0–0.7)
Eosinophils Relative: 5 % (ref 0–5)
HCT: 40 % (ref 36.0–46.0)
Hemoglobin: 13.1 g/dL (ref 12.0–15.0)
Lymphocytes Relative: 34 % (ref 12–46)
Lymphs Abs: 1.8 10*3/uL (ref 0.7–4.0)
MCH: 28 pg (ref 26.0–34.0)
MCHC: 32.8 g/dL (ref 30.0–36.0)
MCV: 85.5 fL (ref 78.0–100.0)
Monocytes Absolute: 0.5 10*3/uL (ref 0.1–1.0)
Monocytes Relative: 10 % (ref 3–12)
Neutro Abs: 2.8 10*3/uL (ref 1.7–7.7)
Neutrophils Relative %: 50 % (ref 43–77)
Platelets: 329 10*3/uL (ref 150–400)
RBC: 4.68 MIL/uL (ref 3.87–5.11)
RDW: 14 % (ref 11.5–15.5)
WBC: 5.4 10*3/uL (ref 4.0–10.5)

## 2013-08-04 LAB — URINALYSIS, ROUTINE W REFLEX MICROSCOPIC
Bilirubin Urine: NEGATIVE
Glucose, UA: NEGATIVE mg/dL
Hgb urine dipstick: NEGATIVE
Ketones, ur: NEGATIVE mg/dL
Leukocytes, UA: NEGATIVE
Nitrite: NEGATIVE
Protein, ur: NEGATIVE mg/dL
Specific Gravity, Urine: 1.01 (ref 1.005–1.030)
Urobilinogen, UA: 0.2 mg/dL (ref 0.0–1.0)
pH: 7 (ref 5.0–8.0)

## 2013-08-04 LAB — COMPREHENSIVE METABOLIC PANEL
ALT: 17 U/L (ref 0–35)
AST: 22 U/L (ref 0–37)
Albumin: 3.3 g/dL — ABNORMAL LOW (ref 3.5–5.2)
Alkaline Phosphatase: 112 U/L (ref 39–117)
BUN: 14 mg/dL (ref 6–23)
CO2: 29 mEq/L (ref 19–32)
Calcium: 9.5 mg/dL (ref 8.4–10.5)
Chloride: 101 mEq/L (ref 96–112)
Creatinine, Ser: 0.71 mg/dL (ref 0.50–1.10)
GFR calc Af Amer: >90 mL/min (ref >90)
GFR calc non Af Amer: >90 mL/min (ref >90)
Glucose, Bld: 99 mg/dL (ref 70–99)
Potassium: 3.1 mEq/L — ABNORMAL LOW (ref 3.7–5.3)
Sodium: 140 mEq/L (ref 137–147)
Total Bilirubin: 0.3 mg/dL (ref 0.3–1.2)
Total Protein: 7.3 g/dL (ref 6.0–8.3)

## 2013-08-04 LAB — TROPONIN I: Troponin I: <0.3 ng/mL (ref <0.30)

## 2013-08-04 MED ORDER — POTASSIUM CHLORIDE CRYS ER 20 MEQ PO TBCR
40.0000 meq | EXTENDED_RELEASE_TABLET | Freq: Once | ORAL | Status: AC
  Administered 2013-08-04: 40 meq via ORAL
  Filled 2013-08-04: qty 2

## 2013-08-04 MED ORDER — SODIUM CHLORIDE 0.9 % IV BOLUS (SEPSIS)
1000.0000 mL | Freq: Once | INTRAVENOUS | Status: AC
  Administered 2013-08-04: 1000 mL via INTRAVENOUS

## 2013-08-04 MED ORDER — POTASSIUM CHLORIDE ER 10 MEQ PO TBCR: 10.0000 meq | EXTENDED_RELEASE_TABLET | Freq: Two times a day (BID) | ORAL | Status: DC

## 2013-08-04 NOTE — ED Provider Notes (Signed)
CSN: [id number]     Arrival date & time 08/04/13  1640 History   First MD Initiated Contact with Patient 08/04/13 1701     Chief Complaint  Patient presents with  . Fatigue     (Consider location/radiation/quality/duration/timing/severity/associated sxs/prior Treatment) Patient is a 53 y.o. female presenting with weakness. The history is provided by the patient (the pt complains of weakness ).  Weakness This is a new problem. The current episode started 6 to 12 hours ago. The problem occurs daily. The problem has not changed since onset.Pertinent negatives include no chest pain, no abdominal pain and no headaches. Nothing aggravates the symptoms. Nothing relieves the symptoms.    Past Medical History  Diagnosis Date  . Hypertension   . GERD (gastroesophageal reflux disease)   . Allergy   . Farsightedness     wears glasses, [facility]  . Anemia     iron therapy for years as of 10/12  . History of uterine fibroid   . Chronic back pain   . Polyarthralgia     normal rheumatoid screen 01/2012  . Constipation   . Chest pain 04/05/2011    cardiac eval, normal treadmill stress test, Dr. Tollie Eth   Past Surgical History  Procedure Laterality Date  . Lipoma excision      forehead  . Knee arthroscopy      left  . Uterine fibroid surgery    . Gall stone surgery    . Colonoscopy      never, pending for 03/2013  . Esophagogastroduodenoscopy  2013    Dr. Benson Norway, gastritis  . Esophagogastroduodenoscopy  [id number]   Family History  Problem Relation Age of Onset  . Hypertension Mother   . Arthritis Mother   . GER disease Mother   . Glaucoma Mother   . Stroke Father   . Alcohol abuse Father   . Cancer Sister     breast cancer dx late 31s; another sister with leukemia  . Hypertension Sister   . Heart disease Sister     heart murmur; other sisters with heart problems  . Diabetes Brother   . Diabetes Maternal Aunt   . Heart disease Maternal Aunt   . Heart disease Maternal  Grandmother   . Heart disease Maternal Grandfather   . Heart disease Maternal Uncle   . Stroke Maternal Uncle    History  Substance Use Topics  . Smoking status: Never Smoker   . Smokeless tobacco: Never Used  . Alcohol Use: No   OB History   Grav Para Term Preterm Abortions TAB SAB Ect Mult Living                 Review of Systems  Constitutional: Negative for appetite change and fatigue.  HENT: Negative for congestion, ear discharge and sinus pressure.   Eyes: Negative for discharge.  Respiratory: Negative for cough.   Cardiovascular: Negative for chest pain.  Gastrointestinal: Negative for abdominal pain and diarrhea.  Genitourinary: Negative for frequency and hematuria.  Musculoskeletal: Negative for back pain.  Skin: Negative for rash.  Neurological: Positive for weakness. Negative for seizures and headaches.  Psychiatric/Behavioral: Negative for hallucinations.      Allergies  Review of patient's allergies indicates no known allergies.  Home Medications   Prior to Admission medications   Medication Sig Start Date End Date Taking? Authorizing Provider  cyclobenzaprine (FLEXERIL) 10 MG tablet Take 10 mg by mouth 2 (two) times daily as needed for muscle spasms.   Yes  Historical Provider, MD  dexlansoprazole (DEXILANT) 60 MG capsule Take 60 mg by mouth daily.   Yes Historical Provider, MD  ferrous fumarate (HEMOCYTE - 106 MG FE) 325 (106 FE) MG TABS tablet Take 1 tablet (106 mg of iron total) by mouth 2 (two) times daily. 01/30/13  Yes Camelia Eng Tysinger, PA-C  fluticasone (FLONASE) 50 MCG/ACT nasal spray Place 1 spray into both nostrils 2 (two) times daily.   Yes Historical Provider, MD  hydrochlorothiazide (HYDRODIURIL) 25 MG tablet Take 25 mg by mouth daily.   Yes Historical Provider, MD  meloxicam (MOBIC) 15 MG tablet Take 15 mg by mouth daily.   Yes Historical Provider, MD  polyethylene glycol powder (GLYCOLAX/MIRALAX) powder Take 255 g (1 Container total) by mouth  once. TAKE 17 G BY MOUTH DAILY 04/15/13  Yes Camelia Eng Tysinger, PA-C  potassium chloride (K-DUR) 10 MEQ tablet Take 1 tablet (10 mEq total) by mouth 2 (two) times daily. 08/04/13   Maudry Diego, MD   BP 132/90  Pulse 73  Temp(Src) 97.5 F (36.4 C) (Oral)  Resp 18  Ht 5\' 9"  (1.753 m)  Wt 208 lb (94.348 kg)  BMI 30.70 kg/m2  SpO2 100% Physical Exam  Constitutional: She is oriented to person, place, and time. She appears well-developed.  HENT:  Head: Normocephalic.  Eyes: Conjunctivae and EOM are normal. No scleral icterus.  Neck: Neck supple. No thyromegaly present.  Cardiovascular: Normal rate and regular rhythm.  Exam reveals no gallop and no friction rub.   No murmur heard. Pulmonary/Chest: No stridor. She has no wheezes. She has no rales. She exhibits no tenderness.  Abdominal: She exhibits no distension. There is no tenderness. There is no rebound.  Musculoskeletal: Normal range of motion. She exhibits no edema.  Lymphadenopathy:    She has no cervical adenopathy.  Neurological: She is oriented to person, place, and time. She exhibits normal muscle tone. Coordination normal.  Skin: No rash noted. No erythema.  Psychiatric: She has a normal mood and affect. Her behavior is normal.    ED Course  Procedures (including critical care time) Labs Review Labs Reviewed  COMPREHENSIVE METABOLIC PANEL - Abnormal; Notable for the following:    Potassium 3.1 (*)    Albumin 3.3 (*)    All other components within normal limits  CBC WITH DIFFERENTIAL  TROPONIN I  URINALYSIS, ROUTINE W REFLEX MICROSCOPIC    Imaging Review Dg Chest 2 View  08/04/2013   CLINICAL DATA:  Diaphoresis and 7 weakness.  EXAM: CHEST  2 VIEW  COMPARISON:  None.  FINDINGS: No evidence for pulmonary edema or focal airspace consolidation. 7 mm pulmonary nodule is seen overlying the left lower lung. The cardiopericardial silhouette is within normal limits for size. Imaged bony structures of the thorax are intact.  Telemetry leads overlie the chest.  IMPRESSION: 7 mm left lower lobe pulmonary nodule. CT chest without contrast recommended to further evaluate.  No evidence for pulmonary edema or focal airspace consolidation.   Electronically Signed   By: Misty Stanley M.D.   On: 08/04/2013 18:00   Ct Chest Wo Contrast  08/04/2013   CLINICAL DATA:  Pulmonary nodule on chest radiograph  EXAM: CT CHEST WITHOUT CONTRAST  TECHNIQUE: Multidetector CT imaging of the chest was performed following the standard protocol without IV contrast.  COMPARISON:  Chest radiographs dated 08/04/2013  FINDINGS: 8 mm calcified granuloma in the lateral left lower lobe (series 3/ image 39), corresponding to the radiographic abnormality, benign.  Two 2  mm subpleural left lower lobe nodules (series 3/image 41), likely reflecting benign subpleural lymph nodes. No suspicious pulmonary nodules. No pleural effusion or pneumothorax.  Visualized thyroid is unremarkable.  The heart is normal in size.  No pericardial effusion.  No suspicious mediastinal or axillary lymphadenopathy. Calcified mediastinal/left hilar lymph nodes.  Visualized upper abdomen is notable for cholecystectomy clips.  Degenerative changes of the visualized thoracolumbar spine.  IMPRESSION: 8 mm calcified left lower lobe granuloma, corresponding to the radiographic abnormality, benign.  Two 2 mm subpleural left lower lobe nodules, likely reflecting benign subpleural lymph nodes. If this patient is high risk for primary bronchogenic neoplasm, a single follow-up CT chest is suggested in 12 months. If low risk, no dedicated follow-up imaging is required.  This recommendation follows the consensus statement: Guidelines for Management of Small Pulmonary Nodules Detected on CT Scans: A Statement from the [place] as published in Radiology 2005; 237:395-400.   Electronically Signed   By: Julian Hy M.D.   On: 08/04/2013 19:33     EKG Interpretation None      MDM   Final  diagnoses:  Weakness        Maudry Diego, MD 08/04/13 2042

## 2013-08-04 NOTE — ED Notes (Signed)
MD at bedside. 

## 2013-08-04 NOTE — Discharge Instructions (Signed)
Follow up with your md next week. °

## 2013-08-04 NOTE — ED Notes (Addendum)
Per EMS, pt at work and broke out in a sudden sweat, extremity numbness/tingling, and weakness. Pt alert and oriented. Generalized weakness. Per EMS, pt work environment is very hot. Pt non-diaphoretic at time of arrival. Pt denies any dizziness, blurred vision, or nausea. nad noted. CBG en route 108.Pt able to follow commands.

## 2013-08-10 ENCOUNTER — Other Ambulatory Visit: Payer: Self-pay | Admitting: Medical

## 2013-08-11 ENCOUNTER — Encounter: Payer: Self-pay | Admitting: Medical

## 2013-08-11 ENCOUNTER — Ambulatory Visit (INDEPENDENT_AMBULATORY_CARE_PROVIDER_SITE_OTHER): Payer: BC Managed Care – PPO | Admitting: Medical

## 2013-08-11 VITALS — BP 102/70 | HR 78 | Temp 98.4°F | Resp 16 | Wt 204.0 lb

## 2013-08-11 DIAGNOSIS — E876 Hypokalemia: Secondary | ICD-10-CM

## 2013-08-11 DIAGNOSIS — R911 Solitary pulmonary nodule: Secondary | ICD-10-CM

## 2013-08-11 DIAGNOSIS — K2970 Gastritis, unspecified, without bleeding: Secondary | ICD-10-CM

## 2013-08-11 DIAGNOSIS — K5900 Constipation, unspecified: Secondary | ICD-10-CM

## 2013-08-11 DIAGNOSIS — R9389 Abnormal findings on diagnostic imaging of other specified body structures: Secondary | ICD-10-CM

## 2013-08-11 DIAGNOSIS — K2990 Gastroduodenitis, unspecified, without bleeding: Secondary | ICD-10-CM

## 2013-08-11 DIAGNOSIS — R110 Nausea: Secondary | ICD-10-CM

## 2013-08-11 MED ORDER — POLYETHYLENE GLYCOL 3350 17 GM/SCOOP PO POWD: 1.0000 | Freq: Every day | ORAL | Status: DC

## 2013-08-11 MED ORDER — DEXLANSOPRAZOLE 60 MG PO CPDR: 60.0000 mg | DELAYED_RELEASE_CAPSULE | Freq: Every day | ORAL | Status: DC

## 2013-08-11 MED ORDER — ONDANSETRON HCL 4 MG PO TABS: 4.0000 mg | ORAL_TABLET | Freq: Three times a day (TID) | ORAL | Status: DC | PRN

## 2013-08-11 MED ORDER — POTASSIUM CHLORIDE ER 10 MEQ PO TBCR: 10.0000 meq | EXTENDED_RELEASE_TABLET | Freq: Two times a day (BID) | ORAL | Status: DC

## 2013-08-11 NOTE — Progress Notes (Signed)
   Subjective:   Lisa Briggs is a 53 y.o. female presenting on 08/11/2013 with Follow-up and Medication Refill  Here for advice and hospital followup.  She went to the emergency department this past week for weakness, had labs showing low potassium and she is on hydrochlorothiazide.  She also apparently had nodules on chest x-ray so a CT chest was done showing nodules which she has questions about.  She is a nonsmoker but does experience secondhand smoke with household contacts.  In general she has been feeling somewhat weak.  She apparently had a disagreement with her gastroenterologist, and needs refills on her "stomach medicine, nausea medicine. "  She has been on Dexilant and  MiraLAX long-term, Zofran when necessary.  No other aggravating or relieving factors.  No other complaint.  Review of Systems ROS as in subjective      Objective:    Filed Vitals:   08/11/13 0812  BP: 102/70  Pulse: 78  Temp: 98.4 F (36.9 C)  Resp: 16    General appearance: alert, no distress, WD/WN Heart: RRR, normal S1, S2, no murmurs Lungs: CTA bilaterally, no wheezes, rhonchi, or rales Abdomen: +bs, soft, non tender, non distended, no masses, no hepatomegaly, no splenomegaly Pulses: 2+ symmetric, upper and lower extremities, normal cap refill      Assessment: Encounter Diagnoses  Name Primary?  . Abnormal chest CT Yes  . Pulmonary nodule   . Hypokalemia   . Chronic nausea   . Gastritis   . Unspecified constipation      Plan: We discussed the chest CT findings which appear benign. However given her second hand smoke exposure, plan to repeat chest CT in one year.  I answered her questions.  Hypokalemia-begin potassium 10 units twice daily, continue HCTZ for blood pressure.  Plan to repeat BMET in 3 weeks  Chronic nausea, chronic gastritis, chronic constipation - c/t current medications.  [name] was seen today for follow-up and medication refill.  Diagnoses and associated orders  for this visit:  Abnormal chest CT  Pulmonary nodule  Hypokalemia - Basic metabolic panel; Future  Chronic nausea - dexlansoprazole (DEXILANT) 60 MG capsule; Take 1 capsule (60 mg total) by mouth daily. - polyethylene glycol powder (GLYCOLAX/MIRALAX) powder; Take 255 g (1 Container total) by mouth daily. TAKE 17 G BY MOUTH DAILY - ondansetron (ZOFRAN) 4 MG tablet; Take 1 tablet (4 mg total) by mouth every 8 (eight) hours as needed for nausea or vomiting.  Gastritis - dexlansoprazole (DEXILANT) 60 MG capsule; Take 1 capsule (60 mg total) by mouth daily.  Unspecified constipation  Other Orders - potassium chloride (K-DUR) 10 MEQ tablet; Take 1 tablet (10 mEq total) by mouth 2 (two) times daily.     Return BMET in 2-3 wk.Marland Kitchen

## 2013-08-14 ENCOUNTER — Telehealth: Payer: Self-pay | Admitting: Family Medicine

## 2013-08-14 NOTE — Telephone Encounter (Signed)
Pt FMLA brought in   Please call pt at [phone] when complete

## 2013-08-14 NOTE — Telephone Encounter (Signed)
pls return/fax form, charge the [facility] fee.

## 2013-08-14 NOTE — Telephone Encounter (Signed)
Called pt and left message that a form is completed & a completion fee need to be collected

## 2013-08-16 ENCOUNTER — Other Ambulatory Visit: Payer: Self-pay | Admitting: Medical

## 2013-09-01 ENCOUNTER — Other Ambulatory Visit: Payer: BC Managed Care – PPO

## 2013-09-01 DIAGNOSIS — E876 Hypokalemia: Secondary | ICD-10-CM

## 2013-09-01 LAB — BASIC METABOLIC PANEL
BUN: 15 mg/dL (ref 6–23)
CO2: 32 mEq/L (ref 19–32)
Calcium: 9.4 mg/dL (ref 8.4–10.5)
Chloride: 101 mEq/L (ref 96–112)
Creat: 0.71 mg/dL (ref 0.50–1.10)
Glucose, Bld: 92 mg/dL (ref 70–99)
Potassium: 4.1 mEq/L (ref 3.5–5.3)
Sodium: 138 mEq/L (ref 135–145)

## 2013-09-03 ENCOUNTER — Telehealth: Payer: Self-pay | Admitting: Medical

## 2013-09-03 NOTE — Telephone Encounter (Signed)
Pt called and stated that the FMLA papers that were completed are incorrect. Pt states she wanted you to understand that she was at work when she was sent to hospital. She states she would like to speak to you concerning the papers. Please call pt at [phone] before 2 today or tomorrow. She goes to work at two and will not be available after two.

## 2013-09-03 NOTE — Telephone Encounter (Signed)
pls pull the FMLA papers and help me figure out what her concern is?

## 2013-09-07 ENCOUNTER — Other Ambulatory Visit: Payer: Self-pay | Admitting: Medical

## 2013-10-08 ENCOUNTER — Other Ambulatory Visit: Payer: Self-pay | Admitting: Family Medicine

## 2013-10-08 DIAGNOSIS — Z1211 Encounter for screening for malignant neoplasm of colon: Secondary | ICD-10-CM

## 2013-11-03 ENCOUNTER — Other Ambulatory Visit: Payer: Self-pay | Admitting: Medical

## 2013-11-04 ENCOUNTER — Other Ambulatory Visit: Payer: Self-pay | Admitting: Medical

## 2013-11-08 ENCOUNTER — Other Ambulatory Visit: Payer: Self-pay | Admitting: Medical

## 2013-11-10 ENCOUNTER — Other Ambulatory Visit: Payer: Self-pay | Admitting: Medical

## 2013-11-10 NOTE — Telephone Encounter (Signed)
Is this okay to refill? 

## 2013-11-11 ENCOUNTER — Ambulatory Visit: Payer: BC Managed Care – PPO | Admitting: Medical

## 2013-11-17 ENCOUNTER — Emergency Department (HOSPITAL_COMMUNITY)
Admission: EM | Admit: 2013-11-17 | Discharge: 2013-11-17 | Disposition: A | Discharge status: Home / Self Care | Payer: BC Managed Care – PPO | Attending: Emergency Medicine | Admitting: Emergency Medicine

## 2013-11-17 ENCOUNTER — Encounter (HOSPITAL_COMMUNITY): Payer: Self-pay | Admitting: Emergency Medicine

## 2013-11-17 ENCOUNTER — Emergency Department (HOSPITAL_COMMUNITY): Payer: BC Managed Care – PPO

## 2013-11-17 DIAGNOSIS — I10 Essential (primary) hypertension: Secondary | ICD-10-CM | POA: Diagnosis not present

## 2013-11-17 DIAGNOSIS — Z79899 Other long term (current) drug therapy: Secondary | ICD-10-CM | POA: Diagnosis not present

## 2013-11-17 DIAGNOSIS — G8929 Other chronic pain: Secondary | ICD-10-CM | POA: Diagnosis not present

## 2013-11-17 DIAGNOSIS — J069 Acute upper respiratory infection, unspecified: Secondary | ICD-10-CM | POA: Insufficient documentation

## 2013-11-17 DIAGNOSIS — IMO0002 Reserved for concepts with insufficient information to code with codable children: Secondary | ICD-10-CM | POA: Diagnosis not present

## 2013-11-17 DIAGNOSIS — Z791 Long term (current) use of non-steroidal anti-inflammatories (NSAID): Secondary | ICD-10-CM | POA: Insufficient documentation

## 2013-11-17 DIAGNOSIS — Z8669 Personal history of other diseases of the nervous system and sense organs: Secondary | ICD-10-CM | POA: Diagnosis not present

## 2013-11-17 DIAGNOSIS — Z87448 Personal history of other diseases of urinary system: Secondary | ICD-10-CM | POA: Insufficient documentation

## 2013-11-17 DIAGNOSIS — R4182 Altered mental status, unspecified: Secondary | ICD-10-CM | POA: Insufficient documentation

## 2013-11-17 DIAGNOSIS — K219 Gastro-esophageal reflux disease without esophagitis: Secondary | ICD-10-CM | POA: Diagnosis not present

## 2013-11-17 DIAGNOSIS — Z8739 Personal history of other diseases of the musculoskeletal system and connective tissue: Secondary | ICD-10-CM | POA: Diagnosis not present

## 2013-11-17 LAB — RAPID URINE DRUG SCREEN, HOSP PERFORMED
Amphetamines: NOT DETECTED
Barbiturates: NOT DETECTED
Benzodiazepines: NOT DETECTED
Cocaine: NOT DETECTED
Opiates: NOT DETECTED
Tetrahydrocannabinol: NOT DETECTED

## 2013-11-17 LAB — COMPREHENSIVE METABOLIC PANEL
ALT: 11 U/L (ref 0–35)
AST: 14 U/L (ref 0–37)
Albumin: 3.5 g/dL (ref 3.5–5.2)
Alkaline Phosphatase: 106 U/L (ref 39–117)
Anion gap: 11 (ref 5–15)
BUN: 17 mg/dL (ref 6–23)
CO2: 29 mEq/L (ref 19–32)
Calcium: 9.5 mg/dL (ref 8.4–10.5)
Chloride: 102 mEq/L (ref 96–112)
Creatinine, Ser: 0.83 mg/dL (ref 0.50–1.10)
GFR calc Af Amer: >90 mL/min (ref >90)
GFR calc non Af Amer: 80 mL/min — ABNORMAL LOW (ref >90)
Glucose, Bld: 94 mg/dL (ref 70–99)
Potassium: 4.1 mEq/L (ref 3.7–5.3)
Sodium: 142 mEq/L (ref 137–147)
Total Bilirubin: 0.3 mg/dL (ref 0.3–1.2)
Total Protein: 7.4 g/dL (ref 6.0–8.3)

## 2013-11-17 LAB — CBC WITH DIFFERENTIAL/PLATELET
Basophils Absolute: 0 10*3/uL (ref 0.0–0.1)
Basophils Relative: 1 % (ref 0–1)
Eosinophils Absolute: 0.1 10*3/uL (ref 0.0–0.7)
Eosinophils Relative: 2 % (ref 0–5)
HCT: 39.3 % (ref 36.0–46.0)
Hemoglobin: 12.6 g/dL (ref 12.0–15.0)
Lymphocytes Relative: 34 % (ref 12–46)
Lymphs Abs: 2.3 10*3/uL (ref 0.7–4.0)
MCH: 27.9 pg (ref 26.0–34.0)
MCHC: 32.1 g/dL (ref 30.0–36.0)
MCV: 86.9 fL (ref 78.0–100.0)
Monocytes Absolute: 0.3 10*3/uL (ref 0.1–1.0)
Monocytes Relative: 5 % (ref 3–12)
Neutro Abs: 4.1 10*3/uL (ref 1.7–7.7)
Neutrophils Relative %: 58 % (ref 43–77)
Platelets: 340 10*3/uL (ref 150–400)
RBC: 4.52 MIL/uL (ref 3.87–5.11)
RDW: 13.9 % (ref 11.5–15.5)
WBC: 6.9 10*3/uL (ref 4.0–10.5)

## 2013-11-17 LAB — URINALYSIS, ROUTINE W REFLEX MICROSCOPIC
Bilirubin Urine: NEGATIVE
Glucose, UA: NEGATIVE mg/dL
Hgb urine dipstick: NEGATIVE
Ketones, ur: NEGATIVE mg/dL
Leukocytes, UA: NEGATIVE
Nitrite: NEGATIVE
Protein, ur: NEGATIVE mg/dL
Specific Gravity, Urine: 1.015 (ref 1.005–1.030)
Urobilinogen, UA: 0.2 mg/dL (ref 0.0–1.0)
pH: 7 (ref 5.0–8.0)

## 2013-11-17 LAB — TROPONIN I: Troponin I: <0.3 ng/mL (ref <0.30)

## 2013-11-17 LAB — LIPASE, BLOOD: Lipase: 22 U/L (ref 11–59)

## 2013-11-17 LAB — CBG MONITORING, ED: Glucose-Capillary: 85 mg/dL (ref 70–99)

## 2013-11-17 MED ORDER — SODIUM CHLORIDE 0.9 % IV BOLUS (SEPSIS)
1000.0000 mL | Freq: Once | INTRAVENOUS | Status: AC
  Administered 2013-11-17: 1000 mL via INTRAVENOUS

## 2013-11-17 NOTE — Discharge Instructions (Signed)
Upper Respiratory Infection, Adult An upper respiratory infection (URI) is also sometimes known as the common cold. The upper respiratory tract includes the nose, sinuses, throat, trachea, and bronchi. Bronchi are the airways leading to the lungs. Most people improve within 1 week, but symptoms can last up to 2 weeks. A residual cough may last even longer.  CAUSES Many different viruses can infect the tissues lining the upper respiratory tract. The tissues become irritated and inflamed and often become very moist. Mucus production is also common. A cold is contagious. You can easily spread the virus to others by oral contact. This includes kissing, sharing a glass, coughing, or sneezing. Touching your mouth or nose and then touching a surface, which is then touched by another person, can also spread the virus. SYMPTOMS  Symptoms typically develop 1 to 3 days after you come in contact with a cold virus. Symptoms vary from person to person. They may include:  Runny nose.  Sneezing.  Nasal congestion.  Sinus irritation.  Sore throat.  Loss of voice (laryngitis).  Cough.  Fatigue.  Muscle aches.  Loss of appetite.  Headache.  Low-grade fever. DIAGNOSIS  You might diagnose your own cold based on familiar symptoms, since most people get a cold 2 to 3 times a year. Your caregiver can confirm this based on your exam. Most importantly, your caregiver can check that your symptoms are not due to another disease such as strep throat, sinusitis, pneumonia, asthma, or epiglottitis. Blood tests, throat tests, and X-rays are not necessary to diagnose a common cold, but they may sometimes be helpful in excluding other more serious diseases. Your caregiver will decide if any further tests are required. RISKS AND COMPLICATIONS  You may be at risk for a more severe case of the common cold if you smoke cigarettes, have chronic heart disease (such as heart failure) or lung disease (such as asthma), or if  you have a weakened immune system. The very young and very old are also at risk for more serious infections. Bacterial sinusitis, middle ear infections, and bacterial pneumonia can complicate the common cold. The common cold can worsen asthma and chronic obstructive pulmonary disease (COPD). Sometimes, these complications can require emergency medical care and may be life-threatening. PREVENTION  The best way to protect against getting a cold is to practice good hygiene. Avoid oral or hand contact with people with cold symptoms. Wash your hands often if contact occurs. There is no clear evidence that vitamin C, vitamin E, echinacea, or exercise reduces the chance of developing a cold. However, it is always recommended to get plenty of rest and practice good nutrition. TREATMENT  Treatment is directed at relieving symptoms. There is no cure. Antibiotics are not effective, because the infection is caused by a virus, not by bacteria. Treatment may include:  Increased fluid intake. Sports drinks offer valuable electrolytes, sugars, and fluids.  Breathing heated mist or steam (vaporizer or shower).  Eating chicken soup or other clear broths, and maintaining good nutrition.  Getting plenty of rest.  Using gargles or lozenges for comfort.  Controlling fevers with ibuprofen or acetaminophen as directed by your caregiver.  Increasing usage of your inhaler if you have asthma. Zinc gel and zinc lozenges, taken in the first 24 hours of the common cold, can shorten the duration and lessen the severity of symptoms. Pain medicines may help with fever, muscle aches, and throat pain. A variety of non-prescription medicines are available to treat congestion and runny nose. Your caregiver   can make recommendations and may suggest nasal or lung inhalers for other symptoms.  HOME CARE INSTRUCTIONS   Only take over-the-counter or prescription medicines for pain, discomfort, or fever as directed by your  caregiver.  Use a warm mist humidifier or inhale steam from a shower to increase air moisture. This may keep secretions moist and make it easier to breathe.  Drink enough water and fluids to keep your urine clear or pale yellow.  Rest as needed.  Return to work when your temperature has returned to normal or as your caregiver advises. You may need to stay home longer to avoid infecting others. You can also use a face mask and careful hand washing to prevent spread of the virus. SEEK MEDICAL CARE IF:   After the first few days, you feel you are getting worse rather than better.  You need your caregiver's advice about medicines to control symptoms.  You develop chills, worsening shortness of breath, or brown or red sputum. These may be signs of pneumonia.  You develop yellow or brown nasal discharge or pain in the face, especially when you bend forward. These may be signs of sinusitis.  You develop a fever, swollen neck glands, pain with swallowing, or white areas in the back of your throat. These may be signs of strep throat. SEEK IMMEDIATE MEDICAL CARE IF:   You have a fever.  You develop severe or persistent headache, ear pain, sinus pain, or chest pain.  You develop wheezing, a prolonged cough, cough up blood, or have a change in your usual mucus (if you have chronic lung disease).  You develop sore muscles or a stiff neck. Document Released: 09/13/2000 Document Revised: 06/12/2011 Document Reviewed: 06/25/2013 [employer] Patient Information 2015 [employer]. This information is not intended to replace advice given to you by your health care provider. Make sure you discuss any questions you have with your health care provider.  

## 2013-11-17 NOTE — ED Provider Notes (Addendum)
TIME SEEN: 5:25 PM  CHIEF COMPLAINT: "I'm feeling weird"  HPI: Patient is a 53 y.o. F with history of hypertension, iron deficiency anemia who presents to the emergency department feeling weak and not acting like herself at work. Patient states that her coworkers were concerned about her because they thought she was confused. Patient cannot elaborate further on this. She states that for the past week she has had cough with yellow sputum production, chest pain with coughing. She has had some chills but documented fevers. She states that today at work she felt strange and had some tingling around her mouth and in her hands. She had diaphoresis and one episode of nonbloody diarrhea. No nausea or vomiting. She denies any pain currently. Denies numbness, tingling or focal weakness. No headache. No neck pain or neck tightness. No sick contacts or recent travel. She states she had similar symptoms in May 2015 and was hypokalemic. She is on hydrochlorothiazide. She's not passing supplements. She denies illicit drug use or alcohol use.  ROS: See HPI Constitutional: no fever  Eyes: no drainage  ENT: no runny nose   Cardiovascular:  no chest pain  Resp: no SOB  GI: no vomiting GU: no dysuria Integumentary: no rash  Allergy: no hives  Musculoskeletal: no leg swelling  Neurological: no slurred speech ROS otherwise negative  PAST MEDICAL HISTORY/PAST SURGICAL HISTORY:  Past Medical History  Diagnosis Date  . Hypertension   . GERD (gastroesophageal reflux disease)   . Allergy   . Farsightedness     wears glasses[facility]  . History of uterine fibroid   . Chronic back pain   . Polyarthralgia     normal rheumatoid screen 01/2012  . Constipation   . Chest pain 04/05/2011    cardiac eval, normal treadmill stress test, Dr. Tollie Eth  . Anemia     iron therapy for years as of 10/12; normal Hgb 08/2013    MEDICATIONS:  Prior to Admission medications   Medication Sig Start Date End Date  Taking? Authorizing Provider  cyclobenzaprine (FLEXERIL) 10 MG tablet Take 10 mg by mouth 2 (two) times daily as needed for muscle spasms.    Historical Provider, MD  cyclobenzaprine (FLEXERIL) 10 MG tablet TAKE ONE TABLET BY MOUTH ONCE DAILY AT BEDTIME OR  UP  TO  TWICE  DAILY  AS  NEEDED  FOR  SPASMS    Camelia Eng Tysinger, PA-C  dexlansoprazole (DEXILANT) 60 MG capsule Take 1 capsule (60 mg total) by mouth daily. 08/11/13   Camelia Eng Tysinger, PA-C  fluticasone (FLONASE) 50 MCG/ACT nasal spray Place 1 spray into both nostrils 2 (two) times daily.    Historical Provider, MD  HEMOCYTE 324 MG TABS tablet TAKE ONE TABLET BY MOUTH TWICE DAILY 11/04/13   Camelia Eng Tysinger, PA-C  HEMOCYTE 324 MG TABS tablet TAKE ONE TABLET BY MOUTH TWICE DAILY    Camelia Eng Tysinger, PA-C  hydrochlorothiazide (HYDRODIURIL) 25 MG tablet Take 25 mg by mouth daily.    Historical Provider, MD  hydrochlorothiazide (HYDRODIURIL) 25 MG tablet TAKE ONE TABLET BY MOUTH ONCE DAILY 11/03/13   Camelia Eng Tysinger, PA-C  meloxicam (MOBIC) 15 MG tablet Take 15 mg by mouth daily.    Historical Provider, MD  meloxicam (MOBIC) 15 MG tablet TAKE ONE TABLET BY MOUTH ONCE DAILY 11/10/13   Camelia Eng Tysinger, PA-C  ondansetron (ZOFRAN) 4 MG tablet Take 1 tablet (4 mg total) by mouth every 8 (eight) hours as needed for nausea or  vomiting. 08/11/13   Camelia Eng Tysinger, PA-C  polyethylene glycol powder (GLYCOLAX/MIRALAX) powder DISSOLVE ONE CAPFUL( 17 GRAMS ) IN 8 0Z GLASS OF WATER AND DRINK BY MOUTH ONCE DAILY    Camelia Eng Tysinger, PA-C  polyethylene glycol powder (GLYCOLAX/MIRALAX) powder Take 255 g (1 Container total) by mouth daily. TAKE 17 G BY MOUTH DAILY 08/11/13   Camelia Eng Tysinger, PA-C  potassium chloride (K-DUR) 10 MEQ tablet Take 1 tablet (10 mEq total) by mouth 2 (two) times daily. 08/11/13   Carlena Hurl, PA-C    ALLERGIES:  No Known Allergies  SOCIAL HISTORY:  History  Substance Use Topics  . Smoking status: Never Smoker   . Smokeless tobacco:  Never Used  . Alcohol Use: No    FAMILY HISTORY: Family History  Problem Relation Age of Onset  . Hypertension Mother   . Arthritis Mother   . GER disease Mother   . Glaucoma Mother   . Stroke Father   . Alcohol abuse Father   . Cancer Sister     breast cancer dx late 91s; another sister with leukemia  . Hypertension Sister   . Heart disease Sister     heart murmur; other sisters with heart problems  . Diabetes Brother   . Diabetes Maternal Aunt   . Heart disease Maternal Aunt   . Heart disease Maternal Grandmother   . Heart disease Maternal Grandfather   . Heart disease Maternal Uncle   . Stroke Maternal Uncle     EXAM: BP 130/75  Pulse 63  Resp 24  SpO2 100% CONSTITUTIONAL: Alert and oriented x 3 and responds appropriately to questions. Well-appearing; well-nourished HEAD: Normocephalic EYES: Conjunctivae clear, PERRL ENT: normal nose; no rhinorrhea; moist mucous membranes; pharynx without lesions noted NECK: Supple, no meningismus, no LAD  CARD: RRR; S1 and S2 appreciated; no murmurs, no clicks, no rubs, no gallops RESP: Normal chest excursion without splinting or tachypnea; breath sounds clear and equal bilaterally; no wheezes, no rhonchi, no rales,  ABD/GI: Normal bowel sounds; non-distended; soft, non-tender, no rebound, no guarding BACK:  The back appears normal and is non-tender to palpation, there is no CVA tenderness EXT: Normal ROM in all joints; non-tender to palpation; no edema; normal capillary refill; no cyanosis    SKIN: Normal color for age and race; warm NEURO: Moves all extremities equally, sensation to light touch intact diffusely, cranial nerves II through XII intact, strength 5/5 in all 4 extremities PSYCH: The patient's mood and manner are appropriate. Grooming and personal hygiene are appropriate.  MEDICAL DECISION MAKING: Patient here with very vague nonspecific symptoms. She is neurologically intact and hemodynamically stable. She has no focal  neurologic complaints to suggest stroke or TIA. I do not feel she needs a head CT at this time. We'll check basic labs including electrolytes given she states she's had similar symptoms when she was hypokalemic. Will also obtain chest x-ray given recent cough evaluate for infiltrate. We'll obtain urinalysis, urine drug screen. Will give IV fluids and continue to closely monitor.  ED PROGRESS: Patient's labs are completely unremarkable. She is normal hemoglobin and electrolytes. LFTs normal. Troponin negative. Urine shows no sign of infection. Urine drug screen negative. Chest x-ray shows no infiltrate. She still neurologically intact. Denies any pain currently. Unclear etiology for patient's vague symptoms today. She may have a viral illness that is causing her cough but no obvious infiltrate. I do not feel she needs further workup or admission at this time. I feel she  is safe for discharge home. Discussed return precautions and supportive care instructions. She verbalized understanding and is comfortable with plan.     EKG Interpretation  Date/Time:  Monday November 17 2013 18:34:19 EDT Ventricular Rate:  51 PR Interval:  142 QRS Duration: 100 QT Interval:  506 QTC Calculation: 466 R Axis:   67 Text Interpretation:  Sinus rhythm Probable left ventricular hypertrophy No significant change since last tracing May 2015 Confirmed by Rashee Marschall,  DO, Artist [facility] [id number]) on 11/17/2013 6:41:08 PM        [place], DO 11/17/13 1945   7:45 PM  Pt is requesting a work note so that she can be out until next Monday, one week from now.  [place], DO 11/17/13 [place], DO 11/17/13 2045

## 2013-11-17 NOTE — ED Notes (Signed)
Pt last seen at baseline on Friday, per coworkers pt is not acting like herself, "she seems disoriented" per EMS, has had prior episode and it was in correlation to potassium levels, GCS 14 in triage. Pt states she has felt this way all weekend and has had diarrhea.

## 2013-11-19 ENCOUNTER — Encounter: Payer: Self-pay | Admitting: Medical

## 2013-11-19 ENCOUNTER — Telehealth: Payer: Self-pay | Admitting: Medical

## 2013-11-19 NOTE — Telephone Encounter (Signed)
I reviewed the FMLA from 08/2013.   What is the purpose of this new FMLA?    pls call and inquire  If regarding GERD/belly pains, Dr. Benson Norway has evaluated her and in the last year he told me that he did not feel she needed to miss work due to this issue.  So if regarding this specific issue, I can't write her out of work/FMLA for this.

## 2013-11-19 NOTE — Telephone Encounter (Signed)
Pt is coming in on Monday to discuss issues with you for Having Headaches, sweating and BP being elevated and that's why she wants the FLMA.

## 2013-11-19 NOTE — Telephone Encounter (Signed)
pls pull old FMLA/last FMLA.  I received another FMLA form although one was completed in 08/2013.

## 2013-11-19 NOTE — Telephone Encounter (Signed)
On your desk for your review. 

## 2013-11-24 ENCOUNTER — Encounter: Payer: Self-pay | Admitting: Medical

## 2013-11-24 ENCOUNTER — Telehealth: Payer: Self-pay | Admitting: Family Medicine

## 2013-11-24 ENCOUNTER — Ambulatory Visit (INDEPENDENT_AMBULATORY_CARE_PROVIDER_SITE_OTHER): Payer: BC Managed Care – PPO | Admitting: Medical

## 2013-11-24 ENCOUNTER — Telehealth: Payer: Self-pay | Admitting: Medical

## 2013-11-24 ENCOUNTER — Other Ambulatory Visit: Payer: Self-pay | Admitting: Medical

## 2013-11-24 VITALS — BP 120/80 | HR 66 | Temp 98.1°F | Resp 16 | Wt 200.0 lb

## 2013-11-24 DIAGNOSIS — R531 Weakness: Secondary | ICD-10-CM

## 2013-11-24 DIAGNOSIS — N949 Unspecified condition associated with female genital organs and menstrual cycle: Secondary | ICD-10-CM

## 2013-11-24 DIAGNOSIS — R102 Pelvic and perineal pain: Secondary | ICD-10-CM

## 2013-11-24 DIAGNOSIS — R5381 Other malaise: Secondary | ICD-10-CM

## 2013-11-24 DIAGNOSIS — R5383 Other fatigue: Secondary | ICD-10-CM

## 2013-11-24 DIAGNOSIS — K2990 Gastroduodenitis, unspecified, without bleeding: Secondary | ICD-10-CM

## 2013-11-24 DIAGNOSIS — K2970 Gastritis, unspecified, without bleeding: Secondary | ICD-10-CM

## 2013-11-24 DIAGNOSIS — I10 Essential (primary) hypertension: Secondary | ICD-10-CM

## 2013-11-24 DIAGNOSIS — R110 Nausea: Secondary | ICD-10-CM

## 2013-11-24 DIAGNOSIS — Z23 Encounter for immunization: Secondary | ICD-10-CM

## 2013-11-24 DIAGNOSIS — G8929 Other chronic pain: Secondary | ICD-10-CM

## 2013-11-24 DIAGNOSIS — R232 Flushing: Secondary | ICD-10-CM

## 2013-11-24 MED ORDER — DEXLANSOPRAZOLE 60 MG PO CPDR: 60.0000 mg | DELAYED_RELEASE_CAPSULE | Freq: Every day | ORAL | Status: DC

## 2013-11-24 MED ORDER — BENZONATATE 100 MG PO CAPS: 100.0000 mg | ORAL_CAPSULE | Freq: Two times a day (BID) | ORAL | Status: DC | PRN

## 2013-11-24 MED ORDER — FAMOTIDINE 20 MG PO TABS: 20.0000 mg | ORAL_TABLET | Freq: Every day | ORAL | Status: DC

## 2013-11-24 MED ORDER — LOSARTAN POTASSIUM-HCTZ 50-12.5 MG PO TABS: 1.0000 | ORAL_TABLET | Freq: Every day | ORAL | Status: DC

## 2013-11-24 NOTE — Telephone Encounter (Signed)
Pt being seen in the office.  I went in to get HR info for [employer] as pt is wanting FMLA for heat issues where she works.  I asked pt for HR info and she said there is no reason to call them they will not listen.  She state she keeps putting in a bid for other jobs and they are not going to give it to her.  I tried to explain FMLA is for a chronic illness and she asked me if BP wasn't a chronic illness?  I said yes but she is saying the heat is her issue.  She states the heat makes her bp go up.  I asked her was her BP controlled and she states yes.    Andria Frames was in the room and advised her to have GYN follow up on her FMLA.  She states when she went to gyn they had an emergency and had to leave and she never rescheduled.  Also she was told to follow up with GI.  She state she is not going back to Dr. Benson Norway, as he will not refill her meds because she owes a bill she said.  Pt stated she had asked [place] for another referral.  Andria Frames checked and we had referred to [facility].   Andria Frames reviewed chart and call [facility] and was told [facility] had talked with pt and she said she was asleep and would call them back.   Pt said OSHA had come out to one of the other buildings.  I advised maybe she needed to get OSHA to come out to her building.  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  I advised if you are part of a UNION why aren't they helping you with heat issue.  She states they are working on getting her points back.  At the end of conversation, pt advised if we want to call HR we can.  I googled [employer] [place]  [phone].

## 2013-11-24 NOTE — Patient Instructions (Signed)
Thank you for giving me the opportunity to serve you today.    Your diagnosis today includes: Encounter Diagnoses  Name Primary?  . Essential hypertension, benign Yes  . Weakness   . Chronic nausea   . Gastritis   . Flushing   . Chronic pelvic pain in female   . Need for prophylactic vaccination and inoculation against influenza      Specific recommendations today include:  I made 2 changes today on your medications:   High blood pressure-stop hydrochlorothiazide, and change to losartan HCT 50/12.5 mg.  Let's see if you do better on this in regards to getting overheated or dehydrated  For GERD-continue Dexilant once daily in the morning, add Famotidine tablet daily at bedtime, avoid foods that increase acid reflux such as citrus foods, spicy foods, and acidic foods  Keep in mind that last year you had an FMLA regarding chronic pelvic pain and chronic abdominal pain.  We referred you back to gastroenterology and gynecology regarding these pains.  Based on their recommendations and treatment plan, they would have to decide whether or not you needed an FMLA for these purposes.  Therefore, I cannot and will not be able to write any FMLA form for chronic pelvic or abdominal pain  Regarding intermittent episodes of weakness and fatigue which is likely due to heat exhaustion, this is something that you will have to work out with your employer to accommodate an adequate work environment.  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  See if the employer can provide cool mist in the work area or better air conditioning  If the work environment is routinely above 92 degrees, then other accommodations may be necessary  We will try and speak to your Scientist, physiological about this but there is no way I can clear you for time away from work  intermittently due to a hot work environment  We updated your influenza vaccine today  Return 41mo for nurse visit for lab.    I have included other useful information below for your review.  Heat-Related Illness Heat-related illnesses occur when the body is unable to properly cool itself. The body normally cools itself by sweating. However, under some conditions sweating is not enough. In these cases, a person's body temperature rises rapidly. Very high body temperatures may damage the brain or other vital organs. Some examples of heat-related illnesses include:  Heat stroke. This occurs when the body is unable to regulate its temperature. The body's temperature rises rapidly, the sweating mechanism fails, and the body is unable to cool down. Body temperature may rise to 106 F (41 C) or higher within 10 to 15 minutes. Heat stroke can cause death or permanent disability if emergency treatment is not provided.  Heat exhaustion. This is a milder form of heat-related illness that can develop after several days of exposure to high temperatures and not enough fluids. It is the body's response to an excessive loss of the water and salt contained in sweat.  Heat cramps. These usually affect people who sweat a lot during heavy activity. This sweating drains the body's salt and moisture. The low salt level in the muscles causes painful cramps. Heat cramps may also be a symptom of heat exhaustion. Heat cramps usually occur in the abdomen, arms, or legs. Get medical attention  for cramps if you have heart problems or are on a low-sodium diet. Those that are at greatest risk for heat-related illnesses include:   The elderly.  Infant and the very young.  People with mental illness and chronic diseases.  People who are overweight (obese).  Young and healthy people can even succumb to heat if they participate in strenuous physical activities during hot weather. CAUSES  Several factors affect the  body's ability to cool itself during extremely hot weather. When the humidity is high, sweat will not evaporate as quickly. This prevents the body from releasing heat quickly. Other factors that can affect the body's ability to cool down include:   Age.  Obesity.  Fever.  Dehydration.  Heart disease.  Mental illness.  Poor circulation.  Sunburn.  Prescription drug use.  Alcohol use. SYMPTOMS  Heat stroke: Warning signs of heat stroke vary, but may include:  An extremely high body temperature (above 103F orally).  A fast, strong pulse.  Dizziness.  Confusion.  Red, hot, and dry skin.  No sweating.  Throbbing headache.  Feeling sick to your stomach (nauseous).  Unconsciousness. Heat exhaustion: Warning signs of heat exhaustion include:  Heavy sweating.  Tiredness.  Headache.  Paleness.  Weakness.  Feeling sick to your stomach (nauseous) or vomiting.  Muscle cramps. Heat cramps  Muscle pains or spasms. TREATMENT  Heat stroke  Get into a cool environment. An indoor place that is air-conditioned may be best.  Take a cool shower or bath. Have someone around to make sure you are okay.  Take your temperature. Make sure it is going down. Heat exhaustion  Drink plenty of fluids. Do not drink liquids that contain caffeine, alcohol, or large amounts of sugar. These cause you to lose more body fluid. Also, avoid very cold drinks. They can cause stomach cramps.  Get into a cool environment. An indoor place that is air-conditioned may be best.  Take a cool shower or bath. Have someone around to make sure you are okay.  Put on lightweight clothing. Heat cramps  Stop whatever activity you were doing. Do not attempt to do that activity for at least 3 hours after the cramps have gone away.  Get into a cool environment. An indoor place that is air-conditioned may be best. [place]  To protect your health when temperatures are extremely  high, follow these tips:  During heavy exercise in a hot environment, drink two to four glasses (16-32 ounces) of cool fluids each hour. Do not wait until you are thirsty to drink. Warning: If your caregiver limits the amount of fluid you drink or has you on water pills, ask how much you should drink while the weather is hot.  Do not drink liquids that contain caffeine, alcohol, or large amounts of sugar. These cause you to lose more body fluid.  Avoid very cold drinks. They can cause stomach cramps.  Wear appropriate clothing. Choose lightweight, light-colored, loose-fitting clothing.  If you must be outdoors, try to limit your outdoor activity to morning and evening hours. Try to rest often in shady areas.  If you are not used to working or exercising in a hot environment, start slowly and pick up the pace gradually.  Stay cool in an air-conditioned place if possible. If your home does not have air conditioning, go to the shopping mall or [employer].  Taking a cool shower or bath may help you cool off. SEEK MEDICAL CARE IF:   You see any of  the symptoms listed above. You may be dealing with a life-threatening emergency.  Symptoms worsen or last longer than 1 hour.  Heat cramps do not get better in 1 hour. MAKE SURE YOU:   Understand these instructions.  Will watch your condition.  Will get help right away if you are not doing well or get worse. Document Released: 12/28/2007 Document Revised: 06/12/2011 Document Reviewed: 12/28/2007 [facility] Patient Information 2015 [place], [place]. This information is not intended to replace advice given to you by your health care provider. Make sure you discuss any questions you have with your health care provider.

## 2013-11-24 NOTE — Progress Notes (Signed)
Subjective: Here for ED f/u, but is mainly here to discuss FMLA.  Was seen in ED 11/17/13 for feeling hot, weak, blurred vision, didn't feel right.  She was at work not feeling right, and supervisor talked to HR, EMS was called.   She was taken to the ED.   Was evaluated and realized.  Wrote out of work for short period, from 8/17-8/24/15.  She states she needs an FMLA completed as she gets these episodes at work "regularly", feels weak at work, gets similar episodes from time to time.   Can't give me a time, but seems to be at least weekly or more.  She states she doesn't plan to be out of work, but wants to be covered by [employer] if she has to miss work due to weakness, heat exhaustion, or similar "episodes."    Compared to 8/17, currently feels much improved, but feels weak to some extent.   Works as a Glass blower/designer.  In extrusion environment, around furnaces, very hot in her work environment.  Has fans in the area, but she notes it is very hot in the work environment.  Thinks it may be over 90 degrees regularly.  Has worked there 3+ years.   Has missed work in May and recently in July.   Has prior FMLA regarding chronic abdominal and pelvic pain.    Works for [employer], [employer] or [employer].  Review of Systems Constitutional: -fever, -chills, -sweats, -unexpected weight change,-fatigue ENT: -runny nose, -ear pain, -sore throat Cardiology:  -chest pain, -palpitations, -edema Respiratory: -cough, -shortness of breath, -wheezing Gastroenterology: -abdominal pain, -nausea, -vomiting, -diarrhea, -constipation  Hematology: -bleeding or bruising problems Musculoskeletal: -arthralgias, -myalgias, -joint swelling, -back pain Ophthalmology: -vision changes Urology: -dysuria, -difficulty urinating, -hematuria, -urinary frequency, -urgency Neurology: -headache, -weakness, -tingling, -numbness     Objective: BP 120/80  Pulse 66  Temp(Src) 98.1 F (36.7 C) (Oral)  Resp 16  Wt 200 lb  (90.719 kg)  LMP 06/01/2013  General appearance: alert, no distress, WD/WN  Oral cavity: MMM, no lesions Neck: supple, no lymphadenopathy, no thyromegaly, no masses Heart: RRR, normal S1, S2, no murmurs Lungs: CTA bilaterally, no wheezes, rhonchi, or rales Extremities: no edema, no cyanosis, no clubbing Pulses: 2+ symmetric, upper and lower extremities, normal cap refill Neurological: alert, oriented x 3, CN2-12 intact, strength normal upper extremities and lower extremities, sensation normal throughout, DTRs 2+ throughout, no cerebellar signs, gait normal Psychiatric: normal affect, behavior normal, pleasant    Assessment: Encounter Diagnoses  Name Primary?  . Essential hypertension, benign Yes  . Weakness   . Chronic nausea   . Gastritis   . Flushing   . Chronic pelvic pain in female   . Need for prophylactic vaccination and inoculation against influenza     Plan: I reviewed her recent emergency department notes, EKG, labs.  Today's visit was more about clarification and proper use of FMLA forms.  Looking back, I had renewed a prior FMLA in 2013 for chronic abdominal pain, however back in 2013 also subsequently referred her to both gastroenterology and gynecology to get a more concrete plan on how we proceed forward.  I reiterated to her today that I cannot do an FMLA form for her regarding chronic abdominal pelvic pain, acid reflux, or related as we referred her for both of these issues.  Both gynecology (Dr. Benjie Karvonen) and gastroenterology (Dr. Benson Norway) did not feel she needed any FMLA form for time off regarding those issues.  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   She doesn't report having any symptoms anywhere else other than at work.  She has had cardiac evaluation 2013 and I do not believe that her current symptoms suggesting a worsening cardiac issue or  need further cardiac workup.  Her recent labs were fine at the emergency department.  I advise she work with her employer to improve the air conditioning in her work area particularly if it's over 90 degrees.  We discussed taking frequent breaks, hydrating well throughout the day.  Despite discussing this matter she doesn't appear to accept the fact that I can't give her a blanket FMLA regarding a hot work environment.  In the event her blood pressure medicine is playing a role, I did change her to losartan HCT so as to have a lower diuretic dose. I also added famotidine at bedtime to help with her reflux issue.  My office manager talked to the patient as well regarding the FMLA forms and how we proceed.  At the end of the conversation she didn't seem to think of calling her HR department would make any difference regarding the hot temperatures in her work environment.  At this point I did not write her any additional FMLA paperwork.  Counseled on the influenza virus vaccine.  Vaccine information sheet given.  Influenza vaccine given after consent obtained.

## 2013-11-24 NOTE — Telephone Encounter (Signed)
Please call   Cough med not called in   [place]

## 2013-11-24 NOTE — Telephone Encounter (Signed)
rx sent

## 2013-11-25 ENCOUNTER — Encounter: Payer: Self-pay | Admitting: Gastroenterology

## 2013-11-25 NOTE — Telephone Encounter (Signed)
Pls let Mrs. 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    Thus, Dana in HR is aware, and they will hopefully be able to come up with a solution  FYI  At Mrs. Cegielski's request, I did call and speak to her HR manager, Hinton Dyer, and advised that I have no FMLA for her at this time, none to be expected.  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   Hinton Dyer did mention that it does get over 90 degrees prolonged in the warehouse at times.  I advised that Mrs. Calender and her company try and work out a compromise on keeping the work environment cool within reason.  advised Mrs. Rollinson allowed breaks ever 1-2 hours when the temp is well into the mid 80s or higher, advised she c/t good water intake which she is doing, and asked that they look into other ways to either keep the work environment temp below 92 degrees or allow more frequent breaks or some other workable solution.    There is nothing at this time that would warrant an FMLA to be completed.

## 2013-11-25 NOTE — Telephone Encounter (Signed)
I called and left msg with Blinda Leatherwood HR with her employer to call me back.

## 2013-11-26 NOTE — Telephone Encounter (Signed)
Patient is aware of [name] [facility] message in detail and she said thank you. CLS

## 2013-12-21 ENCOUNTER — Other Ambulatory Visit: Payer: Self-pay | Admitting: Medical

## 2013-12-22 NOTE — Telephone Encounter (Signed)
Okay to renew

## 2013-12-22 NOTE — Telephone Encounter (Signed)
OKAY BY [employer]

## 2013-12-22 NOTE — Telephone Encounter (Signed)
IS THIS OKAY DR.LALONDE

## 2013-12-30 ENCOUNTER — Ambulatory Visit: Payer: BC Managed Care – PPO | Admitting: Medical

## 2014-01-01 HISTORY — PX: COLONOSCOPY: SHX174

## 2014-01-09 ENCOUNTER — Ambulatory Visit (AMBULATORY_SURGERY_CENTER): Payer: BC Managed Care – PPO | Admitting: *Deleted

## 2014-01-09 ENCOUNTER — Telehealth: Payer: Self-pay | Admitting: *Deleted

## 2014-01-09 VITALS — Ht 69.0 in | Wt 198.0 lb

## 2014-01-09 DIAGNOSIS — Z1211 Encounter for screening for malignant neoplasm of colon: Secondary | ICD-10-CM

## 2014-01-09 MED ORDER — MOVIPREP 100 G PO SOLR: ORAL | Status: DC

## 2014-01-09 NOTE — Progress Notes (Signed)
No allergies to eggs or soy. No problems with anesthesia.  Pt not given Emmi instructions for colonoscopy; no computer access  No oxygen use  No diet drug use  

## 2014-01-09 NOTE — Telephone Encounter (Signed)
She is OK for direct, screening colonoscopy if she has not had a colonoscopy in past 5 years, no need for OV first.

## 2014-01-09 NOTE — Telephone Encounter (Signed)
Pt  Never had colonoscopy; and is aware to proceed with colonoscopy as scheduled

## 2014-01-09 NOTE — Telephone Encounter (Signed)
Dr Ardis Hughs: pt is scheduled for direct screening colonoscopy 01/23/14.  Brother recently diagnosed with colon cancer at age 51.  While reviewing chart I see that she has been former patient of Dr Benson Norway.  Telephone note with Chana Bode says patient has seen Dr Benson Norway within last year.  I called Hung's office and last time pt was seen was March 25, 2012.  EGD with Dr. Benson Norway 02/2012 is in [facility].  Is this pt okay for direct or does she need OV with you to establish care?  Thanks, Juliann Pulse

## 2014-01-13 ENCOUNTER — Other Ambulatory Visit: Payer: Self-pay

## 2014-01-13 DIAGNOSIS — Z1231 Encounter for screening mammogram for malignant neoplasm of breast: Secondary | ICD-10-CM

## 2014-01-15 ENCOUNTER — Encounter: Payer: Self-pay | Admitting: Gastroenterology

## 2014-01-23 ENCOUNTER — Encounter: Payer: Self-pay | Admitting: Gastroenterology

## 2014-01-23 ENCOUNTER — Ambulatory Visit (AMBULATORY_SURGERY_CENTER): Payer: BC Managed Care – PPO | Admitting: Gastroenterology

## 2014-01-23 VITALS — BP 137/81 | HR 65 | Temp 97.0°F | Resp 13 | Ht 69.0 in | Wt 198.0 lb

## 2014-01-23 DIAGNOSIS — Z1211 Encounter for screening for malignant neoplasm of colon: Secondary | ICD-10-CM

## 2014-01-23 DIAGNOSIS — Z800 Family history of malignant neoplasm of digestive organs: Secondary | ICD-10-CM

## 2014-01-23 DIAGNOSIS — K5730 Diverticulosis of large intestine without perforation or abscess without bleeding: Secondary | ICD-10-CM

## 2014-01-23 MED ORDER — SODIUM CHLORIDE 0.9 % IV SOLN: 500.0000 mL | INTRAVENOUS | Status: DC

## 2014-01-23 NOTE — Patient Instructions (Signed)

## 2014-01-23 NOTE — Progress Notes (Signed)
A/ox3 pleased with MAC, report to April RN 

## 2014-01-23 NOTE — Op Note (Signed)
[place]  [address] [place], [id number]   COLONOSCOPY PROCEDURE REPORT  PATIENT: Lisa, Briggs  MR#: [id number] BIRTHDATE: 1960-04-14 , 53  yrs. old GENDER: female ENDOSCOPIST: Milus Banister, MD REFERRED LZ:[employer] [employer], M.D. PROCEDURE DATE:  01/23/2014 PROCEDURE:   Colonoscopy, diagnostic First Screening Colonoscopy - Avg.  risk and is 50 yrs.  old or older Yes.  Prior Negative Screening - Now for repeat screening. N/A  History of Adenoma - Now for follow-up colonoscopy & has been > or = to 3 yrs.  N/A  Polyps Removed Today? No.  Recommend repeat exam, <10 yrs? No. ASA CLASS:   Class II INDICATIONS:patient's immediate family history of colon cancer. MEDICATIONS: Monitored anesthesia care and Propofol 180 mg IV  DESCRIPTION OF PROCEDURE:   After the risks benefits and alternatives of the procedure were thoroughly explained, informed consent was obtained.  The digital rectal exam revealed no abnormalities of the rectum.   The LB [id number]  endoscope was introduced through the anus and advanced to the cecum, which was identified by both the appendix and ileocecal valve. No adverse events experienced.   The quality of the prep was excellent.  The instrument was then slowly withdrawn as the colon was fully examined.  COLON FINDINGS: There was mild diverticulosis noted in the left colon.   The examination was otherwise normal.  Retroflexed views revealed no abnormalities. The time to cecum=3 minutes 12 seconds. Withdrawal time=8 minutes 25 seconds.  The scope was withdrawn and the procedure completed. COMPLICATIONS: There were no immediate complications.  ENDOSCOPIC IMPRESSION: 1.   Mild diverticulosis was noted in the left colon 2.   The examination was otherwise normal  RECOMMENDATIONS: Given your significant family history of colon cancer (your brother), you should have a repeat colonoscopy in 5 years  eSigned:  Milus Banister, MD  01/23/2014 9:56 AM

## 2014-01-26 ENCOUNTER — Telehealth: Payer: Self-pay | Admitting: *Deleted

## 2014-01-26 NOTE — Telephone Encounter (Signed)
  Follow up Call-  Call back number 01/23/2014  Post procedure Call Back phone  # [phone]  Permission to leave phone message Yes     Patient questions:  Do you have a fever, pain , or abdominal swelling? No. Pain Score  0 *  Have you tolerated food without any problems? Yes.    Have you been able to return to your normal activities? Yes.    Do you have any questions about your discharge instructions: Diet   No. Medications  No. Follow up visit  No.  Do you have questions or concerns about your Care? No.  Actions: * If pain score is 4 or above: No action needed, pain <4.

## 2014-02-06 ENCOUNTER — Telehealth: Payer: Self-pay | Admitting: Medical

## 2014-02-07 ENCOUNTER — Other Ambulatory Visit: Payer: Self-pay | Admitting: Medical

## 2014-02-07 MED ORDER — MELOXICAM 15 MG PO TABS: 15.0000 mg | ORAL_TABLET | Freq: Every day | ORAL | Status: DC | PRN

## 2014-02-07 NOTE — Telephone Encounter (Signed)
done

## 2014-02-12 ENCOUNTER — Other Ambulatory Visit: Payer: Self-pay | Admitting: Medical

## 2014-02-13 ENCOUNTER — Ambulatory Visit
Admission: RE | Admit: 2014-02-13 | Discharge: 2014-02-13 | Disposition: A | Discharge status: Home / Self Care | Payer: BC Managed Care – PPO | Source: Ambulatory Visit

## 2014-02-13 DIAGNOSIS — Z1231 Encounter for screening mammogram for malignant neoplasm of breast: Secondary | ICD-10-CM

## 2014-02-16 ENCOUNTER — Encounter: Payer: Self-pay | Admitting: Internal Medicine

## 2014-02-19 ENCOUNTER — Other Ambulatory Visit: Payer: Self-pay | Admitting: Medical

## 2014-02-19 ENCOUNTER — Telehealth: Payer: Self-pay | Admitting: Medical

## 2014-02-19 MED ORDER — LOSARTAN POTASSIUM-HCTZ 50-12.5 MG PO TABS: 1.0000 | ORAL_TABLET | Freq: Every day | ORAL | Status: DC

## 2014-02-19 NOTE — Telephone Encounter (Signed)
Losartan HCT sent.  I assume this has been working much better as I have had no call back

## 2014-02-19 NOTE — Telephone Encounter (Signed)
Needs refill on losartin-hctz,   [facility] was supposed to send request yesterday but I don not see request   Lisa Briggs

## 2014-02-20 NOTE — Telephone Encounter (Signed)
Patient states that is better but she almost had another episode.  Patient is aware that medication was sent to the pharmacy

## 2014-02-20 NOTE — Telephone Encounter (Signed)
See msg

## 2014-03-02 ENCOUNTER — Ambulatory Visit (INDEPENDENT_AMBULATORY_CARE_PROVIDER_SITE_OTHER): Payer: BC Managed Care – PPO | Admitting: Medical

## 2014-03-02 ENCOUNTER — Encounter: Payer: Self-pay | Admitting: Medical

## 2014-03-02 ENCOUNTER — Other Ambulatory Visit (HOSPITAL_COMMUNITY)
Admission: RE | Admit: 2014-03-02 | Discharge: 2014-03-02 | Disposition: A | Discharge status: Home / Self Care | Payer: BC Managed Care – PPO | Source: Ambulatory Visit | Attending: Medical | Admitting: Medical

## 2014-03-02 VITALS — BP 112/70 | HR 76 | Temp 97.6°F | Resp 16 | Ht 69.0 in | Wt 195.0 lb

## 2014-03-02 DIAGNOSIS — Z7722 Contact with and (suspected) exposure to environmental tobacco smoke (acute) (chronic): Secondary | ICD-10-CM

## 2014-03-02 DIAGNOSIS — Z01419 Encounter for gynecological examination (general) (routine) without abnormal findings: Secondary | ICD-10-CM | POA: Insufficient documentation

## 2014-03-02 DIAGNOSIS — J309 Allergic rhinitis, unspecified: Secondary | ICD-10-CM

## 2014-03-02 DIAGNOSIS — R110 Nausea: Secondary | ICD-10-CM

## 2014-03-02 DIAGNOSIS — G8929 Other chronic pain: Secondary | ICD-10-CM | POA: Insufficient documentation

## 2014-03-02 DIAGNOSIS — R252 Cramp and spasm: Secondary | ICD-10-CM

## 2014-03-02 DIAGNOSIS — K2970 Gastritis, unspecified, without bleeding: Secondary | ICD-10-CM

## 2014-03-02 DIAGNOSIS — Z124 Encounter for screening for malignant neoplasm of cervix: Secondary | ICD-10-CM

## 2014-03-02 DIAGNOSIS — M79602 Pain in left arm: Secondary | ICD-10-CM

## 2014-03-02 DIAGNOSIS — M546 Pain in thoracic spine: Secondary | ICD-10-CM

## 2014-03-02 DIAGNOSIS — K219 Gastro-esophageal reflux disease without esophagitis: Secondary | ICD-10-CM

## 2014-03-02 DIAGNOSIS — I10 Essential (primary) hypertension: Secondary | ICD-10-CM

## 2014-03-02 DIAGNOSIS — E785 Hyperlipidemia, unspecified: Secondary | ICD-10-CM

## 2014-03-02 DIAGNOSIS — Q740 Other congenital malformations of upper limb(s), including shoulder girdle: Secondary | ICD-10-CM

## 2014-03-02 DIAGNOSIS — R109 Unspecified abdominal pain: Secondary | ICD-10-CM

## 2014-03-02 DIAGNOSIS — M549 Dorsalgia, unspecified: Secondary | ICD-10-CM

## 2014-03-02 DIAGNOSIS — Z0000 Encounter for general adult medical examination without abnormal findings: Secondary | ICD-10-CM

## 2014-03-02 LAB — POCT URINALYSIS DIPSTICK
Bilirubin, UA: NEGATIVE
Blood, UA: NEGATIVE
Glucose, UA: NEGATIVE
Ketones, UA: NEGATIVE
Leukocytes, UA: NEGATIVE
Nitrite, UA: NEGATIVE
Spec Grav, UA: 1.025
Urobilinogen, UA: NEGATIVE
pH, UA: 6

## 2014-03-02 MED ORDER — FAMOTIDINE 20 MG PO TABS: 20.0000 mg | ORAL_TABLET | Freq: Every day | ORAL | Status: DC

## 2014-03-02 MED ORDER — FLUTICASONE PROPIONATE 50 MCG/ACT NA SUSP: 1.0000 | Freq: Every day | NASAL | Status: DC | PRN

## 2014-03-02 MED ORDER — LOSARTAN POTASSIUM-HCTZ 50-12.5 MG PO TABS: 1.0000 | ORAL_TABLET | Freq: Every day | ORAL | Status: DC

## 2014-03-02 MED ORDER — DEXLANSOPRAZOLE 60 MG PO CPDR: 60.0000 mg | DELAYED_RELEASE_CAPSULE | Freq: Every day | ORAL | Status: DC

## 2014-03-02 NOTE — Progress Notes (Signed)
Subjective:   HPI  Lisa Briggs is a 53 y.o. female who presents for a complete physical.  Medical care team includes:  Dr. Owens Loffler, GI, prior with Dr. Benson Norway, dismissed from their practice  Has seen gynecology in the last few years at my request given chronic pelvic pain, prior requests for FMLA for vague complaints  Podiatry Crisoforo Oxford, PA-C here for primary care Dr. Einar Gip, eye doctor Sees dentist   Preventative care: Last ophthalmology visit:yes Dr. Einar Gip last eye exam 02/2014 Last dental visit:yes, sees yearly Last colonoscopy:01/23/2014 Last mammogram:11/ 2015 Last gynecological exam:today Last EKG:03/06/11  Prior vaccinations: TD or Tdap:01/2011 Influenza:11/2013 Pneumococcal:n/a Shingles/Zostavax:n/a  Advanced directive:n/a Health care power of attorney:n/a Living will:n/a  Concerns: Sees podiatry, saw them recently, on Mobic, not sure about diagnosis  Had colonoscopy with GI/Dr. Ardis Hughs recently in October  She notes left arm pain and swelling compared to right, long term, but wanted to check this out  She has some recent upper back pain, no injury, no trauma, does a lot of manual labor at work.   Right handed.    She notes recent bilat legs cramps.   Reviewed their medical, surgical, family, social, medication, and allergy history and updated chart as appropriate.  Past Medical History  Diagnosis Date  . Hypertension   . GERD (gastroesophageal reflux disease)   . Allergy   . Farsightedness     wears glasses, Eye care center  . History of uterine fibroid   . Chronic back pain   . Polyarthralgia     normal rheumatoid screen 01/2012  . Constipation   . Chest pain 04/05/2011    cardiac eval, normal treadmill stress test, Dr. Tollie Eth  . Anemia     iron therapy for years as of 10/12; normal Hgb 08/2013  . Hyperlipidemia     Past Surgical History  Procedure Laterality Date  . Lipoma excision      forehead  . Knee  arthroscopy Left   . Uterine fibroid surgery    . Gall stone surgery    . Colonoscopy  01/2014    diverticulosis, othwerise normal - Dr. Owens Loffler  . Esophagogastroduodenoscopy  2013    Dr. Benson Norway, gastritis  . Esophagogastroduodenoscopy  [id number]    History   Social History  . Marital Status: Married    Spouse Name: N/A    Number of Children: N/A  . Years of Education: N/A   Occupational History  . Glass blower/designer    Social History Main Topics  . Smoking status: Never Smoker   . Smokeless tobacco: Never Used  . Alcohol Use: No  . Drug Use: No  . Sexual Activity: Not on file   Other Topics Concern  . Not on file   Social History Narrative   Married, has 1 son.  Glass blower/designer.  Active on job.  Does stretching and exercises daily as per physical therapy.  Works 12 hours daily.      Family History  Problem Relation Age of Onset  . Hypertension Mother   . Arthritis Mother   . GER disease Mother   . Glaucoma Mother   . Stroke Father   . Alcohol abuse Father   . Cancer Sister     breast cancer dx late 54s; another sister with leukemia  . Hypertension Sister   . Heart disease Sister     heart murmur; other sisters with heart problems  . Diabetes Brother   . Diabetes Maternal Aunt   .  Heart disease Maternal Aunt   . Heart disease Maternal Grandmother   . Heart disease Maternal Grandfather   . Heart disease Maternal Uncle   . Stroke Maternal Uncle   . Colon cancer Brother 39    Current outpatient prescriptions: dexlansoprazole (DEXILANT) 60 MG capsule, Take 1 capsule (60 mg total) by mouth daily., Disp: 30 capsule, Rfl: 2;  famotidine (PEPCID) 20 MG tablet, Take 1 tablet (20 mg total) by mouth at bedtime., Disp: 30 tablet, Rfl: 2;  ferrous fumarate (HEMOCYTE) 325 (106 FE) MG TABS tablet, Take 1 tablet by mouth 2 (two) times daily., Disp: , Rfl:  fluticasone (FLONASE) 50 MCG/ACT nasal spray, Place 1 spray into both nostrils daily as needed for allergies or  rhinitis. , Disp: , Rfl: ;  loratadine (CLARITIN) 10 MG tablet, Take 10 mg by mouth daily., Disp: , Rfl: ;  losartan-hydrochlorothiazide (HYZAAR) 50-12.5 MG per tablet, Take 1 tablet by mouth daily., Disp: 30 tablet, Rfl: 2 meloxicam (MOBIC) 15 MG tablet, Take 1 tablet (15 mg total) by mouth daily as needed for pain., Disp: 30 tablet, Rfl: 1;  ondansetron (ZOFRAN) 4 MG tablet, Take 1 tablet (4 mg total) by mouth every 8 (eight) hours as needed for nausea or vomiting., Disp: 20 tablet, Rfl: 2;  polyethylene glycol powder (GLYCOLAX/MIRALAX) powder, DISSOLVE ONE CAPFUL IN 8 OUNCES OF WATER & TAKE ONCE DAILY, Disp: 527 g, Rfl: 0 traMADol (ULTRAM) 50 MG tablet, Take 50 mg by mouth daily as needed for moderate pain., Disp: , Rfl: ;  cyclobenzaprine (FLEXERIL) 10 MG tablet, Take 10 mg by mouth 2 (two) times daily as needed for muscle spasms., Disp: , Rfl:   No Known Allergies  Review of Systems Constitutional: -fever, -chills, +sweats, -unexpected weight change, -decreased appetite, -fatigue Allergy: -sneezing, -itching, -congestion Dermatology: -changing moles, --rash, -lumps ENT: -runny nose, -ear pain, -sore throat, -hoarseness, -sinus pain, -teeth pain, - ringing in ears, -hearing loss, -nosebleeds Cardiology: -chest pain, -palpitations, -swelling, -difficulty breathing when lying flat, -waking up short of breath Respiratory: -cough, -shortness of breath, -difficulty breathing with exercise or exertion, -wheezing, -coughing up blood Gastroenterology: -abdominal pain, -nausea, -vomiting, -diarrhea, -constipation, -blood in stool, -changes in bowel movement, -difficulty swallowing or eating Hematology: -bleeding, -bruising  Musculoskeletal: +joint aches, -muscle aches, -joint swelling, -back pain, -neck pain, +cramping, -changes in gait Ophthalmology: denies vision changes, eye redness, itching, discharge Urology: -burning with urination, -difficulty urinating, -blood in urine, -urinary frequency,  -urgency, -incontinence Neurology: -headache, -weakness, -tingling, -numbness, -memory loss, -falls, -dizziness Psychology: -depressed mood, -agitation, -sleep problems     Objective:   Physical Exam  BP 112/70 mmHg  Pulse 76  Temp(Src) 97.6 F (36.4 C) (Tympanic)  Resp 16  Ht 5\' 9"  (1.753 m)  Wt 195 lb (88.451 kg)  BMI 28.78 kg/m2  General appearance: alert, no distress, WD/WN, AA female Skin: right lower abdomen, inferior to umbilicus with 60mm x 65mm brown flat macules, somewhat triangular shaped, but well defined, uniform color. Few other scattered benign appearing macules. No other worrisome lesions HEENT: normocephalic, conjunctiva/corneas normal, sclerae anicteric, PERRLA, EOMi, nares patent, no discharge or erythema, pharynx normal Oral cavity: MMM, tongue normal, teeth in good repair Neck: supple, no lymphadenopathy, no thyromegaly, no masses, normal ROM, no bruits Chest: non tender, normal shape and expansion Heart: RRR, normal S1, S2, no murmurs Lungs: CTA bilaterally, no wheezes, rhonchi, or rales Abdomen: +bs, soft, mild right sided tenderness, otherwise non tender, non distended, no masses, no hepatomegaly, no splenomegaly, no bruits Back: mild left  upper paraspinal region with tendners, but no mass, no deformity, otherwise back non tender, normal ROM, no scoliosis Musculoskeletal: fullness of left upper arm, somewhat asymmetrical diameter compared to right, but no obvious mass, lump, no ruptured muscle, rest of upper extremities non tender, no obvious deformity, normal ROM throughout, lower extremities non tender, no obvious deformity, normal ROM throughout Extremities: no edema, no cyanosis, no clubbing Pulses: 2+ symmetric, upper and lower extremities, normal cap refill Neurological: alert, oriented x 3, CN2-12 intact, strength normal upper extremities and lower extremities, sensation normal throughout, DTRs 2+ throughout, no cerebellar signs, gait normal Psychiatric:  normal affect, behavior normal, pleasant  Breast: nontender, no masses or lumps, no skin changes, no nipple discharge or inversion, no axillary lymphadenopathy Gyn: Normal external genitalia without lesions, vagina with normal mucosa, cervix without lesions, no cervical motion tenderness, no abnormal vaginal discharge. Uterus and adnexa not enlarged, no masses. Pap performed. Exam chaperoned by nurse. Rectal: deferred   Assessment and Plan :    Encounter Diagnoses  Name Primary?  . Encounter for health maintenance examination in adult Yes  . Cramp of both lower extremities   . Gastroesophageal reflux disease without esophagitis   . Second hand smoke exposure   . Arm pain, left   . Asymmetrical arms   . Screening for cervical cancer   . Chronic nausea   . Chronic abdominal pain   . Essential hypertension   . Hyperlipidemia   . Allergic rhinitis, unspecified allergic rhinitis type   . Pain, upper back    Physical exam - discussed healthy lifestyle, diet, exercise, preventative care, vaccinations, and addressed their concerns.  Handout given. Cramps of limbs - labs today, nontender legs GERD - c/t same medication, discussed long term risks of PPIs, seems to be fairly well controlled at current second hand smoke exposure prior - noted Arm pain, asymmetry of arms - send for humerus xray Pap sent chronic nausea, chronic abdominal pain - prior eval with GI and gyn in the last 2 years including recent colonoscopy with Dr. Ardis Hughs HTN - controlled on current medication, tolerate combo medication better than higher dose HCTZ alone.  No recent dizziness, weakness, fatigue. Hx/o hyperlipidemia - reviewed last lipid panel 2014, and overall cardiac risks.   At this point, no major indication for statin therapy.  New ASCVD guidelines and calculator show her with 10 year risk of 1.1%.    Allergic rhinitis - c/t current medication Upper back pain - advised heat, massage, stretching Follow-up  pending labs

## 2014-03-02 NOTE — Addendum Note (Signed)
Addended by: Carlena Hurl on: 03/02/2014 10:05 PM   Modules accepted: Orders

## 2014-03-03 ENCOUNTER — Other Ambulatory Visit: Payer: Self-pay | Admitting: Medical

## 2014-03-03 LAB — BASIC METABOLIC PANEL
BUN: 14 mg/dL (ref 6–23)
CO2: 30 mEq/L (ref 19–32)
Calcium: 9.8 mg/dL (ref 8.4–10.5)
Chloride: 102 mEq/L (ref 96–112)
Creat: 0.59 mg/dL (ref 0.50–1.10)
Glucose, Bld: 91 mg/dL (ref 70–99)
Potassium: 3.6 mEq/L (ref 3.5–5.3)
Sodium: 141 mEq/L (ref 135–145)

## 2014-03-03 LAB — LIPID PANEL
Cholesterol: 188 mg/dL (ref 0–200)
HDL: 81 mg/dL (ref >39)
LDL Cholesterol: 96 mg/dL (ref 0–99)
Total CHOL/HDL Ratio: 2.3 Ratio
Triglycerides: 56 mg/dL (ref <150)
VLDL: 11 mg/dL (ref 0–40)

## 2014-03-03 LAB — VITAMIN D 25 HYDROXY (VIT D DEFICIENCY, FRACTURES): Vit D, 25-Hydroxy: 11 ng/mL — ABNORMAL LOW (ref 30–100)

## 2014-03-03 LAB — MAGNESIUM: Magnesium: 1.9 mg/dL (ref 1.5–2.5)

## 2014-03-03 MED ORDER — VITAMIN D (ERGOCALCIFEROL) 1.25 MG (50000 UNIT) PO CAPS: 50000.0000 [IU] | ORAL_CAPSULE | ORAL | Status: DC

## 2014-03-05 LAB — CYTOLOGY - PAP

## 2014-03-06 ENCOUNTER — Encounter: Payer: Self-pay | Admitting: Family Medicine

## 2014-03-31 ENCOUNTER — Encounter: Payer: Self-pay | Admitting: Gastroenterology

## 2014-03-31 ENCOUNTER — Other Ambulatory Visit (INDEPENDENT_AMBULATORY_CARE_PROVIDER_SITE_OTHER): Payer: BC Managed Care – PPO

## 2014-03-31 ENCOUNTER — Ambulatory Visit (INDEPENDENT_AMBULATORY_CARE_PROVIDER_SITE_OTHER): Payer: BC Managed Care – PPO | Admitting: Gastroenterology

## 2014-03-31 VITALS — BP 118/78 | HR 80 | Ht 68.5 in | Wt 199.5 lb

## 2014-03-31 DIAGNOSIS — R109 Unspecified abdominal pain: Secondary | ICD-10-CM

## 2014-03-31 DIAGNOSIS — R110 Nausea: Secondary | ICD-10-CM

## 2014-03-31 LAB — COMPREHENSIVE METABOLIC PANEL
ALT: 12 U/L (ref 0–35)
AST: 16 U/L (ref 0–37)
Albumin: 3.8 g/dL (ref 3.5–5.2)
Alkaline Phosphatase: 90 U/L (ref 39–117)
BUN: 21 mg/dL (ref 6–23)
CO2: 32 mEq/L (ref 19–32)
Calcium: 9.5 mg/dL (ref 8.4–10.5)
Chloride: 106 mEq/L (ref 96–112)
Creatinine, Ser: 0.6 mg/dL (ref 0.4–1.2)
GFR: 137.76 mL/min (ref >60.00)
Glucose, Bld: 93 mg/dL (ref 70–99)
Potassium: 4.3 mEq/L (ref 3.5–5.1)
Sodium: 143 mEq/L (ref 135–145)
Total Bilirubin: 0.2 mg/dL (ref 0.2–1.2)
Total Protein: 7.4 g/dL (ref 6.0–8.3)

## 2014-03-31 LAB — CBC WITH DIFFERENTIAL/PLATELET
Basophils Absolute: 0 10*3/uL (ref 0.0–0.1)
Basophils Relative: 0.4 % (ref 0.0–3.0)
Eosinophils Absolute: 0.2 10*3/uL (ref 0.0–0.7)
Eosinophils Relative: 2.4 % (ref 0.0–5.0)
HCT: 39.8 % (ref 36.0–46.0)
Hemoglobin: 12.6 g/dL (ref 12.0–15.0)
Lymphocytes Relative: 37.5 % (ref 12.0–46.0)
Lymphs Abs: 2.9 10*3/uL (ref 0.7–4.0)
MCHC: 31.5 g/dL (ref 30.0–36.0)
MCV: 85.1 fl (ref 78.0–100.0)
Monocytes Absolute: 0.6 10*3/uL (ref 0.1–1.0)
Monocytes Relative: 7.4 % (ref 3.0–12.0)
Neutro Abs: 4.1 10*3/uL (ref 1.4–7.7)
Neutrophils Relative %: 52.3 % (ref 43.0–77.0)
Platelets: 312 10*3/uL (ref 150.0–400.0)
RBC: 4.68 Mil/uL (ref 3.87–5.11)
RDW: 14.7 % (ref 11.5–15.5)
WBC: 7.8 10*3/uL (ref 4.0–10.5)

## 2014-03-31 NOTE — Progress Notes (Signed)
Review of pertinent gastrointestinal problems: 1. Family history of colon cancer, brother:  Colonoscopy Dr. Ardis Hughs 01/2014 found diverticulosis but no polyps, recommended recall colonoscopy at 5 year interval. 2. Gastritis on EGD Dr. Benson Norway 02/2012, biopsy results not available at time of 12/15 office apt 3. Cholelithiasis; on [place] 2012    HPI: This is a   pleasant 53 year old woman whom I last saw the time of a colonoscopy 2 months ago for family history of colon cancer. This is the first time I am meeting her in the office.   She has intermittent nausea, believes she has GERD. Will vomit at times.  She needs miralax daily for chronic mild constipation.  Nausea occurs intermittently.  Not associated with any foods.  She can have periumbilical pains.  Overall her weight has been decreasing (has documented weight loss 12 pounds in 1 - 2 years).  Sometimes nauseas can occur after eating.  Ultrasound 2-3 years ago shows gallstones in her gallbladder but she really does not have biliary type discomforts.     Review of systems: Pertinent positive and negative review of systems were noted in the above HPI section. Complete review of systems was performed and was otherwise normal.    Past Medical History  Diagnosis Date  . Hypertension   . GERD (gastroesophageal reflux disease)   . Allergy   . Farsightedness     wears glasses, Eye care center  . History of uterine fibroid   . Chronic back pain   . Polyarthralgia     normal rheumatoid screen 01/2012  . Constipation   . Chest pain 04/05/2011    cardiac eval, normal treadmill stress test, Dr. Tollie Eth  . Anemia     iron therapy for years as of 10/12; normal Hgb 08/2013  . Hyperlipidemia     Past Surgical History  Procedure Laterality Date  . Lipoma excision      forehead  . Knee arthroscopy Left   . Uterine fibroid surgery    . Gall stone surgery    . Colonoscopy  01/2014    diverticulosis, othwerise normal - Dr. Owens Loffler  . Esophagogastroduodenoscopy  2013    Dr. Benson Norway, gastritis  . Esophagogastroduodenoscopy  [id number]    Current Outpatient Prescriptions  Medication Sig Dispense Refill  . cyclobenzaprine (FLEXERIL) 10 MG tablet Take 10 mg by mouth 2 (two) times daily as needed for muscle spasms.    Marland Kitchen dexlansoprazole (DEXILANT) 60 MG capsule Take 1 capsule (60 mg total) by mouth daily. 90 capsule 1  . famotidine (PEPCID) 20 MG tablet Take 1 tablet (20 mg total) by mouth at bedtime. 90 tablet 1  . ferrous fumarate (HEMOCYTE) 325 (106 FE) MG TABS tablet Take 1 tablet by mouth 2 (two) times daily.    . fluticasone (FLONASE) 50 MCG/ACT nasal spray Place 1 spray into both nostrils daily as needed for allergies or rhinitis. 16 g 11  . loratadine (CLARITIN) 10 MG tablet Take 10 mg by mouth daily.    Marland Kitchen losartan-hydrochlorothiazide (HYZAAR) 50-12.5 MG per tablet Take 1 tablet by mouth daily. 90 tablet 3  . meloxicam (MOBIC) 15 MG tablet Take 1 tablet (15 mg total) by mouth daily as needed for pain. 30 tablet 1  . ondansetron (ZOFRAN) 4 MG tablet Take 1 tablet (4 mg total) by mouth every 8 (eight) hours as needed for nausea or vomiting. 20 tablet 2  . polyethylene glycol powder (GLYCOLAX/MIRALAX) powder DISSOLVE ONE CAPFUL IN 8 OUNCES OF WATER & TAKE  ONCE DAILY 527 g 0  . traMADol (ULTRAM) 50 MG tablet Take 50 mg by mouth daily as needed for moderate pain.    . Vitamin D, Ergocalciferol, (DRISDOL) 50000 UNITS CAPS capsule Take 1 capsule (50,000 Units total) by mouth every 7 (seven) days. 4.5 capsule 3   No current facility-administered medications for this visit.    Allergies as of 03/31/2014  . (No Known Allergies)    Family History  Problem Relation Age of Onset  . Hypertension Mother   . Arthritis Mother   . GER disease Mother   . Glaucoma Mother   . Stroke Father   . Alcohol abuse Father   . Breast cancer Sister     breast cancer dx late 33s; another sister with leukemia  . Hypertension Sister   .  Heart disease Sister     heart murmur; other sisters with heart problems  . Diabetes Brother   . Diabetes Maternal Aunt   . Heart disease Maternal Aunt   . Heart disease Maternal Grandmother   . Heart disease Maternal Grandfather   . Heart disease Maternal Uncle   . Stroke Maternal Uncle   . Colon cancer Brother 3  . Colon polyps Neg Hx     History   Social History  . Marital Status: Married    Spouse Name: N/A    Number of Children: 1  . Years of Education: N/A   Occupational History  . Glass blower/designer    Social History Main Topics  . Smoking status: Never Smoker   . Smokeless tobacco: Never Used  . Alcohol Use: No  . Drug Use: No  . Sexual Activity: Not on file   Other Topics Concern  . Not on file   Social History Narrative   Married, has 1 son.  Glass blower/designer.  Active on job.  Does stretching and exercises daily as per physical therapy.  Works 12 hours daily.         Physical Exam: BP 118/78 mmHg  Pulse 80  Ht 5' 8.5" (1.74 m)  Wt 199 lb 8 oz (90.493 kg)  BMI 29.89 kg/m2 Constitutional: generally well-appearing Psychiatric: alert and oriented x3 Eyes: extraocular movements intact Mouth: oral pharynx moist, no lesions Neck: supple no lymphadenopathy Cardiovascular: heart regular rate and rhythm Lungs: clear to auscultation bilaterally Abdomen: soft, nontender, nondistended, no obvious ascites, no peritoneal signs, normal bowel sounds Extremities: no lower extremity edema bilaterally Skin: no lesions on visible extremities    Assessment and plan: 53 y.o. female with  chronic intermittent nausea, chronic lower abdominal discomforts  She has gallstones in her gallbladder on ultrasound done 2-3 years ago by her primary care physician. She does have intermittent nausea and that can be gallstone related however she has no other biliary type symptoms. The pains she describes are related lower in her abdomen and are chronic. I don't see cross-sectional  abdominal imaging in several years and so I will have her get a CT scan abdomen and pelvis. She will also get a basic set of labs today including CBC, complete metabolic profile. She had upper endoscopy about 2 years ago with another provider we will get those results sent here for review.

## 2014-03-31 NOTE — Patient Instructions (Addendum)
We will get records sent from your previous gastroenterologist Dr. Benson Norway for review.  This will include any endoscopic (colonoscopy or upper endoscopy 2013) procedures and any associated pathology reports.   You will be set up for a CT scan of abdomen and pelvis with IV and oral contrast (for abdominal pains lower abdomen). You have been scheduled for a CT scan of the abdomen and pelvis at [facility] ([address]---this is in the same building as Press [facility]).   You are scheduled on 04/01/14 at 4 pm. You should arrive 15 minutes prior to your appointment time for registration. Please follow the written instructions below on the day of your exam:  WARNING: IF YOU ARE ALLERGIC TO IODINE/X-RAY DYE, PLEASE NOTIFY RADIOLOGY IMMEDIATELY AT [phone]! YOU WILL BE GIVEN A 13 HOUR PREMEDICATION PREP.  1) Do not eat or drink anything after 12 noon (4 hours prior to your test) 2) You have been given 2 bottles of oral contrast to drink. The solution may taste better if refrigerated, but do NOT add ice or any other liquid to this solution. Shake well before drinking.    Drink 1 bottle of contrast @ 2 pm (2 hours prior to your exam)  Drink 1 bottle of contrast @ 3 pm (1 hour prior to your exam)  You may take any medications as prescribed with a small amount of water except for the following: Metformin, Glucophage, Glucovance, Avandamet, Riomet, Fortamet, Actoplus Met, Janumet, Glumetza or Metaglip. The above medications must be held the day of the exam AND 48 hours after the exam.  The purpose of you drinking the oral contrast is to aid in the visualization of your intestinal tract. The contrast solution may cause some diarrhea. Before your exam is started, you will be given a small amount of fluid to drink. Depending on your individual set of symptoms, you may also receive an intravenous injection of x-ray contrast/dye. Plan on being at [facility] for 30 minutes or long,  depending on the type of exam you are having performed.  This test typically takes 30-45 minutes to complete.  If you have any questions regarding your exam or if you need to reschedule, you may call the CT department at [phone] between the hours of 8:00 am and 5:00 pm, Monday-Friday.  ________________________________________________________________________  Dennis Bast will have labs checked today in the basement lab.  Please head down after you check out with the front desk  (cbc, cmet).

## 2014-04-01 ENCOUNTER — Ambulatory Visit (INDEPENDENT_AMBULATORY_CARE_PROVIDER_SITE_OTHER)
Admission: RE | Admit: 2014-04-01 | Discharge: 2014-04-01 | Disposition: A | Discharge status: Home / Self Care | Payer: BC Managed Care – PPO | Source: Ambulatory Visit | Attending: Gastroenterology | Admitting: Gastroenterology

## 2014-04-01 DIAGNOSIS — R109 Unspecified abdominal pain: Secondary | ICD-10-CM

## 2014-04-01 MED ORDER — IOHEXOL 300 MG/ML  SOLN
100.0000 mL | Freq: Once | INTRAMUSCULAR | Status: AC | PRN
  Administered 2014-04-01: 100 mL via INTRAVENOUS

## 2014-04-04 ENCOUNTER — Other Ambulatory Visit: Payer: Self-pay | Admitting: Medical

## 2014-04-06 ENCOUNTER — Other Ambulatory Visit: Payer: Self-pay

## 2014-04-06 DIAGNOSIS — K3189 Other diseases of stomach and duodenum: Secondary | ICD-10-CM

## 2014-04-09 ENCOUNTER — Encounter (HOSPITAL_COMMUNITY): Payer: Self-pay | Admitting: *Deleted

## 2014-04-09 ENCOUNTER — Other Ambulatory Visit: Payer: Self-pay | Admitting: Medical

## 2014-04-09 ENCOUNTER — Telehealth: Payer: Self-pay | Admitting: Family Medicine

## 2014-04-09 MED ORDER — FERROUS FUMARATE 325 (106 FE) MG PO TABS: 1.0000 | ORAL_TABLET | Freq: Two times a day (BID) | ORAL | Status: DC

## 2014-04-09 NOTE — Telephone Encounter (Signed)
Pt want iron pill refilled to [place] at [facility]

## 2014-04-10 ENCOUNTER — Telehealth: Payer: Self-pay | Admitting: Gastroenterology

## 2014-04-10 ENCOUNTER — Other Ambulatory Visit: Payer: Self-pay | Admitting: Medical

## 2014-04-10 NOTE — Telephone Encounter (Signed)
Reviewed outside records  EGD. Dr. Benson Norway 11/13 done for nausea, vomiting, dysphagia: findings "mild gastritis, rings in esophagus"  Biopsies showed mild chronic inactive gastritis, no h. Pylori, No EoE.

## 2014-04-16 ENCOUNTER — Encounter (HOSPITAL_COMMUNITY)
Admission: RE | Disposition: A | Discharge status: Home / Self Care | Payer: Self-pay | Source: Ambulatory Visit | Attending: Gastroenterology

## 2014-04-16 ENCOUNTER — Ambulatory Visit (HOSPITAL_COMMUNITY): Payer: BLUE CROSS/BLUE SHIELD | Admitting: Registered Nurse

## 2014-04-16 ENCOUNTER — Ambulatory Visit (HOSPITAL_COMMUNITY)
Admission: RE | Admit: 2014-04-16 | Discharge: 2014-04-16 | Disposition: A | Discharge status: Home / Self Care | Payer: BLUE CROSS/BLUE SHIELD | Source: Ambulatory Visit | Attending: Gastroenterology | Admitting: Gastroenterology

## 2014-04-16 ENCOUNTER — Encounter (HOSPITAL_COMMUNITY): Payer: Self-pay | Admitting: Gastroenterology

## 2014-04-16 ENCOUNTER — Telehealth: Payer: Self-pay

## 2014-04-16 DIAGNOSIS — Z800 Family history of malignant neoplasm of digestive organs: Secondary | ICD-10-CM | POA: Diagnosis not present

## 2014-04-16 DIAGNOSIS — M549 Dorsalgia, unspecified: Secondary | ICD-10-CM | POA: Insufficient documentation

## 2014-04-16 DIAGNOSIS — K3189 Other diseases of stomach and duodenum: Secondary | ICD-10-CM

## 2014-04-16 DIAGNOSIS — K5900 Constipation, unspecified: Secondary | ICD-10-CM | POA: Diagnosis not present

## 2014-04-16 DIAGNOSIS — E785 Hyperlipidemia, unspecified: Secondary | ICD-10-CM | POA: Diagnosis not present

## 2014-04-16 DIAGNOSIS — D131 Benign neoplasm of stomach: Secondary | ICD-10-CM | POA: Diagnosis not present

## 2014-04-16 DIAGNOSIS — K319 Disease of stomach and duodenum, unspecified: Secondary | ICD-10-CM | POA: Diagnosis not present

## 2014-04-16 DIAGNOSIS — I10 Essential (primary) hypertension: Secondary | ICD-10-CM | POA: Insufficient documentation

## 2014-04-16 DIAGNOSIS — M2550 Pain in unspecified joint: Secondary | ICD-10-CM | POA: Diagnosis not present

## 2014-04-16 DIAGNOSIS — K8080 Other cholelithiasis without obstruction: Secondary | ICD-10-CM | POA: Insufficient documentation

## 2014-04-16 DIAGNOSIS — D649 Anemia, unspecified: Secondary | ICD-10-CM | POA: Diagnosis not present

## 2014-04-16 DIAGNOSIS — R110 Nausea: Secondary | ICD-10-CM | POA: Diagnosis present

## 2014-04-16 DIAGNOSIS — K219 Gastro-esophageal reflux disease without esophagitis: Secondary | ICD-10-CM | POA: Insufficient documentation

## 2014-04-16 DIAGNOSIS — G8929 Other chronic pain: Secondary | ICD-10-CM | POA: Diagnosis not present

## 2014-04-16 HISTORY — PX: EUS: SHX5427

## 2014-04-16 SURGERY — UPPER ENDOSCOPIC ULTRASOUND (EUS) LINEAR
Anesthesia: Monitor Anesthesia Care

## 2014-04-16 MED ORDER — MIDAZOLAM HCL 5 MG/5ML IJ SOLN
INTRAMUSCULAR | Status: DC | PRN
  Administered 2014-04-16: 2 mg via INTRAVENOUS

## 2014-04-16 MED ORDER — LACTATED RINGERS IV SOLN
INTRAVENOUS | Status: DC | PRN
  Administered 2014-04-16: 08:00:00 via INTRAVENOUS

## 2014-04-16 MED ORDER — LIDOCAINE HCL (CARDIAC) 20 MG/ML IV SOLN
INTRAVENOUS | Status: AC
  Filled 2014-04-16: qty 5

## 2014-04-16 MED ORDER — PROPOFOL 10 MG/ML IV BOLUS
INTRAVENOUS | Status: AC
  Filled 2014-04-16: qty 20

## 2014-04-16 MED ORDER — SODIUM CHLORIDE 0.9 % IV SOLN: INTRAVENOUS | Status: DC

## 2014-04-16 MED ORDER — KETAMINE HCL 10 MG/ML IJ SOLN
INTRAMUSCULAR | Status: AC
  Filled 2014-04-16: qty 1

## 2014-04-16 MED ORDER — MIDAZOLAM HCL 2 MG/2ML IJ SOLN
INTRAMUSCULAR | Status: AC
  Filled 2014-04-16: qty 2

## 2014-04-16 MED ORDER — PROPOFOL INFUSION 10 MG/ML OPTIME
INTRAVENOUS | Status: DC | PRN
  Administered 2014-04-16: 120 ug/kg/min via INTRAVENOUS

## 2014-04-16 MED ORDER — KETAMINE HCL 10 MG/ML IJ SOLN
INTRAMUSCULAR | Status: DC | PRN
  Administered 2014-04-16: 10 mg via INTRAVENOUS

## 2014-04-16 MED ORDER — LIDOCAINE HCL (CARDIAC) 20 MG/ML IV SOLN
INTRAVENOUS | Status: DC | PRN
Start: 2014-04-16 — End: 2014-04-16
  Administered 2014-04-16: 100 mg via INTRAVENOUS

## 2014-04-16 NOTE — Discharge Instructions (Signed)
YOU HAD AN ENDOSCOPIC PROCEDURE TODAY: Refer to the procedure report that was given to you for any specific questions about what was found during the examination.  If the procedure report does not answer your questions, please call your gastroenterologist to clarify. ° °YOU SHOULD EXPECT: Some feelings of bloating in the abdomen. Passage of more gas than usual.  Walking can help get rid of the air that was put into your GI tract during the procedure and reduce the bloating. If you had a lower endoscopy (such as a colonoscopy or flexible sigmoidoscopy) you may notice spotting of blood in your stool or on the toilet paper.  ° °DIET: Your first meal following the procedure should be a light meal and then it is ok to progress to your normal diet.  A half-sandwich or bowl of soup is an example of a good first meal.  Heavy or fried foods are harder to digest and may make you feel nasueas or bloated.  Drink plenty of fluids but you should avoid alcoholic beverages for 24 hours. ° °ACTIVITY: Your care partner should take you home directly after the procedure.  You should plan to take it easy, moving slowly for the rest of the day.  You can resume normal activity the day after the procedure however you should NOT DRIVE or use heavy machinery for 24 hours (because of the sedation medicines used during the test).   ° °SYMPTOMS TO REPORT IMMEDIATELY  °A gastroenterologist can be reached at any hour.  Please call your doctor's office for any of the following symptoms: ° °· Following lower endoscopy (colonoscopy, flexible sigmoidoscopy) ° Excessive amounts of blood in the stool ° Significant tenderness, worsening of abdominal pains ° Swelling of the abdomen that is new, acute ° Fever of 100° or higher °· Following upper endoscopy (EGD, EUS, ERCP) ° Vomiting of blood or coffee ground material ° New, significant abdominal pain ° New, significant chest pain or pain under the shoulder blades ° Painful or persistently difficult  swallowing ° New shortness of breath ° Black, tarry-looking stools ° °FOLLOW UP: °If any biopsies were taken you will be contacted by phone or by letter within the next 1-3 weeks.  Call your gastroenterologist if you have not heard about the biopsies in 3 weeks.  °Please also call your gastroenterologist's office with any specific questions about appointments or follow up tests. ° ° °Esophagogastroduodenoscopy °Care After °Refer to this sheet in the next few weeks. These instructions provide you with information on caring for yourself after your procedure. Your caregiver may also give you more specific instructions. Your treatment has been planned according to current medical practices, but problems sometimes occur. Call your caregiver if you have any problems or questions after your procedure.  °HOME CARE INSTRUCTIONS °· Do not eat or drink anything until the numbing medicine (local anesthetic) has worn off and your gag reflex has returned. You will know that the local anesthetic has worn off when you can swallow comfortably. °· Do not drive for 12 hours after the procedure or as directed by your caregiver. °· Only take medicines as directed by your caregiver. °SEEK MEDICAL CARE IF:  °· You cannot stop coughing. °· You are not urinating at all or less than usual. °SEEK IMMEDIATE MEDICAL CARE IF: °· You have difficulty swallowing. °· You cannot eat or drink. °· You have worsening throat or chest pain. °· You have dizziness, lightheadedness, or you faint. °· You have nausea or vomiting. °· You have   chills. °· You have a fever. °· You have severe abdominal pain. °· You have black, tarry, or bloody stools. °Document Released: 03/06/2012 Document Reviewed: 03/06/2012 °ExitCare® Patient Information ©2015 [employer]. This information is not intended to replace advice given to you by your health care provider. Make sure you discuss any questions you have with your health care provider. ° ° °Conscious Sedation, Adult,  Care After °Refer to this sheet in the next few weeks. These instructions provide you with information on caring for yourself after your procedure. Your health care provider may also give you more specific instructions. Your treatment has been planned according to current medical practices, but problems sometimes occur. Call your health care provider if you have any problems or questions after your procedure. °WHAT TO EXPECT AFTER THE PROCEDURE  °After your procedure: °· You may feel sleepy, clumsy, and have poor balance for several hours. °· Vomiting may occur if you eat too soon after the procedure. °HOME CARE INSTRUCTIONS °· Do not participate in any activities where you could become injured for at least 24 hours. Do not: °¨ Drive. °¨ Swim. °¨ Ride a bicycle. °¨ Operate heavy machinery. °¨ Cook. °¨ Use power tools. °¨ Climb ladders. °¨ Work from a high place. °· Do not make important decisions or sign legal documents until you are improved. °· If you vomit, drink water, juice, or soup when you can drink without vomiting. Make sure you have little or no nausea before eating solid foods. °· Only take over-the-counter or prescription medicines for pain, discomfort, or fever as directed by your health care provider. °· Make sure you and your family fully understand everything about the medicines given to you, including what side effects may occur. °· You should not drink alcohol, take sleeping pills, or take medicines that cause drowsiness for at least 24 hours. °· If you smoke, do not smoke without supervision. °· If you are feeling better, you may resume normal activities 24 hours after you were sedated. °· Keep all appointments with your health care provider. °SEEK MEDICAL CARE IF: °· Your skin is pale or bluish in color. °· You continue to feel nauseous or vomit. °· Your pain is getting worse and is not helped by medicine. °· You have bleeding or swelling. °· You are still sleepy or feeling clumsy after 24  hours. °SEEK IMMEDIATE MEDICAL CARE IF: °· You develop a rash. °· You have difficulty breathing. °· You develop any type of allergic problem. °· You have a fever. °MAKE SURE YOU: °· Understand these instructions. °· Will watch your condition. °· Will get help right away if you are not doing well or get worse. °Document Released: 01/08/2013 Document Reviewed: 01/08/2013 °ExitCare® Patient Information ©2015 [employer]. This information is not intended to replace advice given to you by your health care provider. Make sure you discuss any questions you have with your health care provider. ° °

## 2014-04-16 NOTE — Anesthesia Procedure Notes (Signed)
Procedure Name: MAC Date/Time: 04/16/2014 9:32 AM Performed by: Carleene Cooper A Pre-anesthesia Checklist: Timeout performed, Patient identified, Emergency Drugs available, Patient being monitored and Suction available Patient Re-evaluated:Patient Re-evaluated prior to inductionOxygen Delivery Method: Nasal cannula Dental Injury: Teeth and Oropharynx as per pre-operative assessment

## 2014-04-16 NOTE — Transfer of Care (Signed)
Immediate Anesthesia Transfer of Care Note  [name]  Procedure(s) Performed: Procedure(s): UPPER ENDOSCOPIC ULTRASOUND (EUS) LINEAR (N/A)  Patient Location: PACU and Endoscopy Unit  Anesthesia Type:MAC  Level of Consciousness: awake, alert , oriented and patient cooperative  Airway & Oxygen Therapy: Patient Spontanous Breathing and Patient connected to nasal cannula oxygen  Post-op Assessment: Report given to PACU RN, Post -op Vital signs reviewed and stable and Patient moving all extremities  Post vital signs: Reviewed and stable  Complications: No apparent anesthesia complications

## 2014-04-16 NOTE — Telephone Encounter (Signed)
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  thanks

## 2014-04-16 NOTE — Telephone Encounter (Signed)
Pt has been notified of [facility] appt

## 2014-04-16 NOTE — H&P (View-Only) (Signed)
Review of pertinent gastrointestinal problems: 1. Family history of colon cancer, brother:  Colonoscopy Dr. Ardis Hughs 01/2014 found diverticulosis but no polyps, recommended recall colonoscopy at 5 year interval. 2. Gastritis on EGD Dr. Benson Norway 02/2012, biopsy results not available at time of 12/15 office apt 3. Cholelithiasis; on [place] 2012    HPI: [name] This is the first time I am meeting her in the office.   She has intermittent nausea, believes she has GERD. Will vomit at times.  She needs miralax daily for chronic mild constipation.  Nausea occurs intermittently.  Not associated with any foods.  She can have periumbilical pains.  Overall her weight has been decreasing (has documented weight loss 12 pounds in 1 - 2 years).  Sometimes nauseas can occur after eating.  Ultrasound 2-3 years ago shows gallstones in her gallbladder but she really does not have biliary type discomforts.     Review of systems: Pertinent positive and negative review of systems were noted in the above HPI section. Complete review of systems was performed and was otherwise normal.    Past Medical History  Diagnosis Date  . Hypertension   . GERD (gastroesophageal reflux disease)   . Allergy   . Farsightedness     wears glasses, [facility]  . History of uterine fibroid   . Chronic back pain   . Polyarthralgia     normal rheumatoid screen 01/2012  . Constipation   . Chest pain 04/05/2011    cardiac eval, normal treadmill stress test, Dr. Tollie Eth  . Anemia     iron therapy for years as of 10/12; normal Hgb 08/2013  . Hyperlipidemia     Past Surgical History  Procedure Laterality Date  . Lipoma excision      forehead  . Knee arthroscopy Left   . Uterine fibroid surgery    . Gall stone surgery    . Colonoscopy  01/2014    diverticulosis, othwerise normal - Dr. Owens Loffler  . Esophagogastroduodenoscopy  2013    Dr. Benson Norway, gastritis  . Esophagogastroduodenoscopy  [id number]    Current Outpatient Prescriptions  Medication Sig Dispense Refill  . cyclobenzaprine (FLEXERIL) 10 MG tablet Take 10 mg by mouth 2 (two) times daily as needed for muscle spasms.    Marland Kitchen dexlansoprazole (DEXILANT) 60 MG capsule Take 1 capsule (60 mg total) by mouth daily. 90 capsule 1  . famotidine (PEPCID) 20 MG tablet Take 1 tablet (20 mg total) by mouth at bedtime. 90 tablet 1  . ferrous fumarate (HEMOCYTE) 325 (106 FE) MG TABS tablet Take 1 tablet by mouth 2 (two) times daily.    . fluticasone (FLONASE) 50 MCG/ACT nasal spray Place 1 spray into both nostrils daily as needed for allergies or rhinitis. 16 g 11  . loratadine (CLARITIN) 10 MG tablet Take 10 mg by mouth daily.    Marland Kitchen losartan-hydrochlorothiazide (HYZAAR) 50-12.5 MG per tablet Take 1 tablet by mouth daily. 90 tablet 3  . meloxicam (MOBIC) 15 MG tablet Take 1 tablet (15 mg total) by mouth daily as needed for pain. 30 tablet 1  . ondansetron (ZOFRAN) 4 MG tablet Take 1 tablet (4 mg total) by mouth every 8 (eight) hours as needed for nausea or vomiting. 20 tablet 2  . polyethylene glycol powder (GLYCOLAX/MIRALAX) powder DISSOLVE ONE CAPFUL IN 8 OUNCES OF WATER & TAKE  ONCE DAILY 527 g 0  . traMADol (ULTRAM) 50 MG tablet Take 50 mg by mouth daily as needed for moderate pain.    . Vitamin D, Ergocalciferol, (DRISDOL) 50000 UNITS CAPS capsule Take 1 capsule (50,000 Units total) by mouth every 7 (seven) days. 4.5 capsule 3   No current facility-administered medications for this visit.    Allergies as of 03/31/2014  . (No Known Allergies)    Family History  Problem Relation Age of Onset  . Hypertension Mother   . Arthritis Mother   . GER disease Mother   . Glaucoma Mother   . Stroke Father   . Alcohol abuse Father   . Breast cancer Sister     breast cancer dx late 48s; another sister with leukemia  . Hypertension Sister   .  Heart disease Sister     heart murmur; other sisters with heart problems  . Diabetes Brother   . Diabetes Maternal Aunt   . Heart disease Maternal Aunt   . Heart disease Maternal Grandmother   . Heart disease Maternal Grandfather   . Heart disease Maternal Uncle   . Stroke Maternal Uncle   . Colon cancer Brother 79  . Colon polyps Neg Hx     History   Social History  . Marital Status: Married    Spouse Name: N/A    Number of Children: 1  . Years of Education: N/A   Occupational History  . Glass blower/designer    Social History Main Topics  . Smoking status: Never Smoker   . Smokeless tobacco: Never Used  . Alcohol Use: No  . Drug Use: No  . Sexual Activity: Not on file   Other Topics Concern  . Not on file   Social History Narrative   Married, has 1 son.  Glass blower/designer.  Active on job.  Does stretching and exercises daily as per physical therapy.  Works 12 hours daily.         Physical Exam: BP 118/78 mmHg  Pulse 80  Ht 5' 8.5" (1.74 m)  Wt 199 lb 8 oz (90.493 kg)  BMI 29.89 kg/m2 Constitutional: generally well-appearing Psychiatric: alert and oriented x3 Eyes: extraocular movements intact Mouth: oral pharynx moist, no lesions Neck: supple no lymphadenopathy Cardiovascular: heart regular rate and rhythm Lungs: clear to auscultation bilaterally Abdomen: soft, nontender, nondistended, no obvious ascites, no peritoneal signs, normal bowel sounds Extremities: no lower extremity edema bilaterally Skin: no lesions on visible extremities    Assessment and plan: 54 y.o. female with  chronic intermittent nausea, chronic lower abdominal discomforts  She has gallstones in her gallbladder on ultrasound done 2-3 years ago by her primary care physician. She does have intermittent nausea and that can be gallstone related however she has no other biliary type symptoms. The pains she describes are related lower in her abdomen and are chronic. I don't see cross-sectional  abdominal imaging in several years and so I will have her get a CT scan abdomen and pelvis. She will also get a basic set of labs today including CBC, complete metabolic profile. She had upper endoscopy about 2 years ago with another provider we will get those results sent here for review.

## 2014-04-16 NOTE — Anesthesia Preprocedure Evaluation (Addendum)
Anesthesia Evaluation  Patient identified by MRN, date of birth, ID band Patient awake    Reviewed: Allergy & Precautions, H&P , Patient's Chart, lab work & pertinent test results, reviewed documented beta blocker date and time   History of Anesthesia Complications Negative for: history of anesthetic complications  Airway Mallampati: II  TM Distance: >3 FB Neck ROM: full    Dental   Pulmonary  breath sounds clear to auscultation        Cardiovascular Exercise Tolerance: Good hypertension, Rhythm:regular Rate:Normal     Neuro/Psych negative psych ROS   GI/Hepatic GERD-  Controlled,  Endo/Other    Renal/GU      Musculoskeletal   Abdominal   Peds  Hematology  (+) anemia ,   Anesthesia Other Findings   Reproductive/Obstetrics                            Anesthesia Physical Anesthesia Plan  ASA: II  Anesthesia Plan: MAC   Post-op Pain Management:    Induction:   Airway Management Planned: Nasal Cannula  Additional Equipment:   Intra-op Plan:   Post-operative Plan:   Informed Consent: I have reviewed the patients History and Physical, chart, labs and discussed the procedure including the risks, benefits and alternatives for the proposed anesthesia with the patient or authorized representative who has indicated his/her understanding and acceptance.   Dental Advisory Given  Plan Discussed with: CRNA, Surgeon and Anesthesiologist  Anesthesia Plan Comments:        Anesthesia Quick Evaluation

## 2014-04-16 NOTE — Op Note (Signed)
[facility] [place], [address]   ENDOSCOPIC ULTRASOUND PROCEDURE REPORT  [name], Lisa Briggs  MR#: [id number] BIRTHDATE: 08-06-1960  GENDER: female ENDOSCOPIST: Milus Banister, MD PROCEDURE DATE:  04/16/2014 PROCEDURE:   Upper EUS w/FNA ASA CLASS:      Class II INDICATIONS:   nausea, intermittent abd pains, recent CT showing likely gastric wall mass. MEDICATIONS: Monitored anesthesia care  DESCRIPTION OF PROCEDURE:   After the risks benefits and alternatives of the procedure were  explained, informed consent was obtained. The patient was then placed in the left, lateral, decubitus postion and IV sedation was administered. Throughout the procedure, the patients blood pressure, pulse and oxygen saturations were monitored continuously.  Under direct visualization, the Pentax EUS Radial [id number]  endoscope was introduced through the mouth  and advanced to the second portion of the duodenum .  Water was used as necessary to provide an acoustic interface.  Upon completion of the imaging, water was removed and the patient was sent to the recovery room in satisfactory condition.   Endoscopic findings: 1. Normal UGI tract  EUS findings: 1. Round, well circumscribed hypoechoic mass along the anterior wall of proximal stomach. The mass clearly communicates with the muscularis propria layer of the gastric wall, measures 2.9cm by 2.5cm.  The mass was sampled with two transgastric passes with a 22 gauge EUS FNA needle. 2. No perigastric adenopathy. 3. CBD was normal, non-dilated and without filling defects 4. Pancreatic parenchym and main pancreatic duct were normal 5. Gallstones in GB 6. Limited views of liver, spleen, portal and splenic vessels were all normal.  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 Await final pathology report. At this size, >2cm, she  should be considered to elective resection.  My office will begin referral process, would also recommend considering same time cholecystectomy if feasible (she has clear gallstones, +/- biliary symptoms).  RECOMMENDATIONS: as above  _______________________________ eSignedMilus Banister, MD 04/16/2014 10:08 AM   CC:  PATIENT NAME:  Novia, Lansberry MR#: [id number]

## 2014-04-16 NOTE — Anesthesia Postprocedure Evaluation (Signed)
Anesthesia Post Note  [name]  Procedure(s) Performed: Procedure(s) (LRB): UPPER ENDOSCOPIC ULTRASOUND (EUS) LINEAR (N/A)  Anesthesia type: MAC  Patient location: PACU  Post pain: Pain level controlled  Post assessment: Post-op Vital signs reviewed  Last Vitals:  Filed Vitals:   04/16/14 1030  BP: 123/67  Pulse:   Temp:   Resp: 13    Post vital signs: Reviewed  Level of consciousness: sedated  Complications: No apparent anesthesia complications

## 2014-04-16 NOTE — Interval H&P Note (Signed)
History and Physical Interval Note:  04/16/2014 9:23 AM  Lisa Briggs  has presented today for surgery, with the diagnosis of perigastric mass  The various methods of treatment have been discussed with the patient and family. After consideration of risks, benefits and other options for treatment, the patient has consented to  Procedure(s): UPPER ENDOSCOPIC ULTRASOUND (EUS) LINEAR (N/A) as a surgical intervention .  The patient's history has been reviewed, patient examined, no change in status, stable for surgery.  I have reviewed the patient's chart and labs.  Questions were answered to the patient's satisfaction.     Milus Banister

## 2014-04-16 NOTE — Telephone Encounter (Signed)
04/24/14 350 pm Dr Rosendo Gros arrive at 320 pm Left message on machine to call back

## 2014-04-17 ENCOUNTER — Encounter (HOSPITAL_COMMUNITY): Payer: Self-pay | Admitting: Gastroenterology

## 2014-04-30 ENCOUNTER — Ambulatory Visit (INDEPENDENT_AMBULATORY_CARE_PROVIDER_SITE_OTHER): Payer: Self-pay | Admitting: General Surgery

## 2014-04-30 NOTE — H&P (Signed)
History of Present Illness [name] MD; 04/30/2014 9:55 AM) The patient is a 54 year old female who presents with a complaint of gist tumor. Gist tumor  The patient is a 54 year old female who is referred by Dr. Ardis Hughs for evaluation of a gastric mass.  Patient states that she has had attempts this year history of abdominal pain. She was previously diagnosed with gastritis on EGD in 2013. Patient recently underwent a and endoscopic ultrasound which revealed a round anterior wall gastric mass that communicates with the muscularis propria layer measuring 2.9 x 2.5 cm. 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  Diagnostic Studies History [name], [place]; 04/30/2014 9:23 AM) Colonoscopy never Mammogram within last year  Social History [name], [place]; 04/30/2014 9:23 AM) Caffeine use Carbonated beverages. No alcohol use No drug use Tobacco use Never smoker.  Family History [name], [place]; 04/30/2014 9:23 AM) Alcohol Abuse Brother, Family Members In [place], Father, Sister, Son. Anesthetic complications Family Members In General. Arthritis Family Members In General, Mother, Sister. Breast Cancer Sister. Cervical Cancer Sister. Colon Polyps Brother. Depression Family Members In General, Sister. Diabetes Mellitus Brother, Family Members In General. Hypertension Brother, Family Members In [place], Father, Mother, Sister.  Pregnancy / Birth History [name], [place]; 04/30/2014 9:23 AM) Age at menarche 32 years. Age of menopause 61-50 Maternal age 55-25 Para 1 Regular periods  Review of Systems ([place]; 04/30/2014 9:23 AM) General Present- Night  Sweats and Weight Loss. Not Present- Appetite Loss, Chills, Fatigue, Fever and Weight Gain. Skin Present- Dryness. Not Present- Change in Wart/Mole, Hives, Jaundice, New Lesions, Non-Healing Wounds, Rash and Ulcer. HEENT Present- Sore Throat and Wears glasses/contact lenses. Not Present- Earache, Hearing Loss, Hoarseness, Nose Bleed, Oral Ulcers, Ringing in the Ears, Seasonal Allergies, Sinus Pain, Visual Disturbances and Yellow Eyes. Cardiovascular Present- Leg Cramps and Swelling of Extremities. Not Present- Chest Pain, Difficulty Breathing Lying Down, Palpitations, Rapid Heart Rate and Shortness of Breath. Gastrointestinal Present- Nausea. Not Present- Abdominal Pain, Bloating, Bloody Stool, Change in Bowel Habits, Chronic diarrhea, Constipation, Difficulty Swallowing, Excessive gas, Gets full quickly at meals, Hemorrhoids, Indigestion, Rectal Pain and Vomiting. Female Genitourinary Present- Pelvic Pain. Not Present- Frequency, Nocturia, Painful Urination and Urgency. Musculoskeletal Present- Back Pain, Joint Pain, Muscle Pain and Muscle Weakness. Not Present- Joint Stiffness and Swelling of Extremities. Neurological Present- Numbness, Tingling and Trouble walking. Not Present- Decreased Memory, Fainting, Headaches, Seizures, Tremor and Weakness. Endocrine Present- Hot flashes. Not Present- Cold Intolerance, Excessive Hunger, Hair Changes, Heat Intolerance and New Diabetes.   Vitals [name] CMA; 04/30/2014 9:25 AM) 04/30/2014 9:24 AM Weight: 204.2 lb Height: 69in Body Surface Area: 2.12 m Body Mass Index: 30.15 kg/m Temp.: 97.2F(Oral)  Pulse: 60 (Regular)  Resp.: 16 (Unlabored)  BP: 122/68 (Sitting, Left Arm, Standard)    Physical Exam [name] MD; 04/30/2014 9:47 AM) General Mental Status-Alert. General Appearance-Consistent with stated age. Hydration-Well hydrated. Voice-Normal.  Head and Neck Head-normocephalic, atraumatic with no lesions or  palpable masses. Trachea-midline. Thyroid Gland Characteristics - normal size and consistency.  Chest and Lung Exam Chest and lung exam reveals -quiet, even and easy respiratory effort with no use of accessory muscles and on auscultation, normal breath sounds, no adventitious sounds and normal vocal resonance. Inspection Chest Wall - Normal.  Back - normal.  Cardiovascular Cardiovascular examination reveals -normal heart sounds, regular rate and rhythm with no murmurs and normal pedal pulses bilaterally.  Abdomen Inspection Inspection of the abdomen reveals - No Hernias. Skin - Scar - no surgical scars. Palpation/Percussion Palpation and Percussion of the abdomen reveal - Soft, Non Tender, No Rebound tenderness, No Rigidity (guarding) and No hepatosplenomegaly. Auscultation Auscultation of the abdomen reveals - Bowel sounds normal.  Neurologic Neurologic evaluation reveals -alert and oriented x 3 with no impairment of recent or remote memory. Mental Status-Normal.  Musculoskeletal Normal Exam - Left-Upper Extremity Strength Normal and Lower Extremity Strength Normal. Normal Exam - Right-Upper Extremity Strength Normal and Lower Extremity Strength Normal.    Assessment & Plan [name] MD; 04/30/2014 9:55 AM) GASTRIC MASS (537.9  K31.9) Impression: 54 year old female with gastric mass  1. 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 This is in the anterior wall of the proximal stomach. Pathology results revealed spindle cell mass. The patient like to proceed with a laparoscopic, EGD assisted GIST tumor resection. 2. I did discuss the risks and benefits of each procedure to specifically include gastric perforation, possible gastric leakage, possible need for further surgery in regards to her gastric tumor resection.

## 2014-05-22 ENCOUNTER — Telehealth: Payer: Self-pay | Admitting: Medical

## 2014-05-22 NOTE — Telephone Encounter (Signed)
Isn't the policy for patient to call pharmacy to send refill requests?  Just curious

## 2014-05-22 NOTE — Telephone Encounter (Signed)
Pt needs all medicines refilled to [facility] except Miralax.  Running out of meds

## 2014-05-24 ENCOUNTER — Other Ambulatory Visit: Payer: Self-pay | Admitting: Medical

## 2014-05-25 ENCOUNTER — Other Ambulatory Visit: Payer: Self-pay | Admitting: Family Medicine

## 2014-05-25 DIAGNOSIS — K2970 Gastritis, unspecified, without bleeding: Secondary | ICD-10-CM

## 2014-05-25 DIAGNOSIS — R110 Nausea: Secondary | ICD-10-CM

## 2014-05-25 MED ORDER — LORATADINE 10 MG PO TABS: 10.0000 mg | ORAL_TABLET | Freq: Every day | ORAL | Status: DC

## 2014-05-25 MED ORDER — LOSARTAN POTASSIUM-HCTZ 50-12.5 MG PO TABS: 1.0000 | ORAL_TABLET | Freq: Every day | ORAL | Status: DC

## 2014-05-25 MED ORDER — FERROUS FUMARATE 325 (106 FE) MG PO TABS: 1.0000 | ORAL_TABLET | Freq: Two times a day (BID) | ORAL | Status: DC

## 2014-05-25 MED ORDER — VITAMIN D (ERGOCALCIFEROL) 1.25 MG (50000 UNIT) PO CAPS: 50000.0000 [IU] | ORAL_CAPSULE | ORAL | Status: DC

## 2014-05-25 MED ORDER — DEXLANSOPRAZOLE 60 MG PO CPDR: 60.0000 mg | DELAYED_RELEASE_CAPSULE | Freq: Every day | ORAL | Status: DC

## 2014-05-25 MED ORDER — FLUTICASONE PROPIONATE 50 MCG/ACT NA SUSP: 1.0000 | Freq: Every day | NASAL | Status: DC | PRN

## 2014-05-25 MED ORDER — FAMOTIDINE 20 MG PO TABS: 20.0000 mg | ORAL_TABLET | Freq: Every day | ORAL | Status: DC

## 2014-05-25 NOTE — Telephone Encounter (Signed)
Yes sir we do tell pt's to call their pharmacy for refills, but if we have them on the phone, we go ahead and put in the telephone call

## 2014-05-25 NOTE — Telephone Encounter (Signed)
Is this okay to refill? 

## 2014-05-25 NOTE — Telephone Encounter (Signed)
All medication was refilled but mirlax  and her pain medication

## 2014-05-25 NOTE — Telephone Encounter (Signed)
pls handle this refill request

## 2014-05-31 ENCOUNTER — Other Ambulatory Visit: Payer: Self-pay | Admitting: Medical

## 2014-06-01 NOTE — Patient Instructions (Addendum)
Alpha Chouinard  06/01/2014   Your procedure is scheduled on: 06/09/2014    Report to [facility] Main  Entrance and follow signs to               [facility] at       [place] AM.  Call this number if you have problems the morning of surgery [phone]   Remember:  Do not eat food or drink liquids :After Midnight.     Take these medicines the morning of surgery with A SIP OF WATER: Dexilant                                You may not have any metal on your body including hair pins and              piercings  Do not wear jewelry, make-up, lotions, powders or perfumes., deodorant.               Do not wear nail polish.  Do not shave  48 hours prior to surgery.                 Do not bring valuables to the hospital. [place].  Contacts, dentures or bridgework may not be worn into surgery.  Leave suitcase in the car. After surgery it may be brought to your room.       Special Instructions: coughing and deep breathing exercises, leg exercises               Please read over the following fact sheets you were given: _____________________________________________________________________             [facility] - Preparing for Surgery Before surgery, you can play an important role.  Because skin is not sterile, your skin needs to be as free of germs as possible.  You can reduce the number of germs on your skin by washing with CHG (chlorahexidine gluconate) soap before surgery.  CHG is an antiseptic cleaner which kills germs and bonds with the skin to continue killing germs even after washing. Please DO NOT use if you have an allergy to CHG or antibacterial soaps.  If your skin becomes reddened/irritated stop using the CHG and inform your nurse when you arrive at Short Stay. Do not shave (including legs and underarms) for at least 48 hours prior to the first CHG shower.  You may shave your face/neck. Please follow  these instructions carefully:  1.  Shower with CHG Soap the night before surgery and the  morning of Surgery.  2.  If you choose to wash your hair, wash your hair first as usual with your  normal  shampoo.  3.  After you shampoo, rinse your hair and body thoroughly to remove the  shampoo.                           4.  Use CHG as you would any other liquid soap.  You can apply chg directly  to the skin and wash                       Gently with a scrungie or clean washcloth.  5.  Apply the CHG Soap to your body ONLY FROM THE NECK DOWN.   Do not use on face/ open                           Wound or open sores. Avoid contact with eyes, ears mouth and genitals (private parts).                       Wash face,  Genitals (private parts) with your normal soap.             6.  Wash thoroughly, paying special attention to the area where your surgery  will be performed.  7.  Thoroughly rinse your body with warm water from the neck down.  8.  DO NOT shower/wash with your normal soap after using and rinsing off  the CHG Soap.                9.  Pat yourself dry with a clean towel.            10.  Wear clean pajamas.            11.  Place clean sheets on your bed the night of your first shower and do not  sleep with pets. Day of Surgery : Do not apply any lotions/deodorants the morning of surgery.  Please wear clean clothes to the hospital/surgery center.  FAILURE TO FOLLOW THESE INSTRUCTIONS MAY RESULT IN THE CANCELLATION OF YOUR SURGERY PATIENT SIGNATURE_________________________________  NURSE SIGNATURE__________________________________  ________________________________________________________________________

## 2014-06-02 ENCOUNTER — Encounter (INDEPENDENT_AMBULATORY_CARE_PROVIDER_SITE_OTHER): Payer: Self-pay

## 2014-06-02 ENCOUNTER — Encounter (HOSPITAL_COMMUNITY)
Admission: RE | Admit: 2014-06-02 | Discharge: 2014-06-02 | Disposition: A | Discharge status: Home / Self Care | Payer: BLUE CROSS/BLUE SHIELD | Source: Ambulatory Visit | Attending: General Surgery | Admitting: General Surgery

## 2014-06-02 ENCOUNTER — Encounter (HOSPITAL_COMMUNITY): Payer: Self-pay

## 2014-06-02 DIAGNOSIS — K319 Disease of stomach and duodenum, unspecified: Secondary | ICD-10-CM | POA: Diagnosis not present

## 2014-06-02 DIAGNOSIS — Z01818 Encounter for other preprocedural examination: Secondary | ICD-10-CM | POA: Insufficient documentation

## 2014-06-02 DIAGNOSIS — C49A0 Gastrointestinal stromal tumor, unspecified site: Secondary | ICD-10-CM

## 2014-06-02 HISTORY — DX: Unspecified osteoarthritis, unspecified site: M19.90

## 2014-06-02 HISTORY — DX: Gastrointestinal stromal tumor, unspecified site: C49.A0

## 2014-06-02 LAB — CBC
HCT: 40.2 % (ref 36.0–46.0)
Hemoglobin: 12.2 g/dL (ref 12.0–15.0)
MCH: 27.1 pg (ref 26.0–34.0)
MCHC: 30.3 g/dL (ref 30.0–36.0)
MCV: 89.1 fL (ref 78.0–100.0)
Platelets: 326 10*3/uL (ref 150–400)
RBC: 4.51 MIL/uL (ref 3.87–5.11)
RDW: 14.1 % (ref 11.5–15.5)
WBC: 7.9 10*3/uL (ref 4.0–10.5)

## 2014-06-02 LAB — BASIC METABOLIC PANEL
Anion gap: 6 (ref 5–15)
BUN: 22 mg/dL (ref 6–23)
CO2: 32 mmol/L (ref 19–32)
Calcium: 9.5 mg/dL (ref 8.4–10.5)
Chloride: 104 mmol/L (ref 96–112)
Creatinine, Ser: 0.56 mg/dL (ref 0.50–1.10)
GFR calc Af Amer: >90 mL/min (ref >90)
GFR calc non Af Amer: >90 mL/min (ref >90)
Glucose, Bld: 87 mg/dL (ref 70–99)
Potassium: 4.1 mmol/L (ref 3.5–5.1)
Sodium: 142 mmol/L (ref 135–145)

## 2014-06-02 NOTE — Progress Notes (Signed)
EKG- 11/18/2013 EPIC  CXR- 11/17/13 EPIC  ECHO- 2013 EPIC

## 2014-06-09 ENCOUNTER — Encounter (HOSPITAL_COMMUNITY): Payer: Self-pay | Admitting: *Deleted

## 2014-06-09 ENCOUNTER — Ambulatory Visit (HOSPITAL_COMMUNITY): Payer: BLUE CROSS/BLUE SHIELD | Admitting: Registered Nurse

## 2014-06-09 ENCOUNTER — Encounter (HOSPITAL_COMMUNITY)
Admission: RE | Disposition: A | Discharge status: Home / Self Care | Payer: Self-pay | Source: Ambulatory Visit | Attending: General Surgery

## 2014-06-09 ENCOUNTER — Inpatient Hospital Stay (HOSPITAL_COMMUNITY)
Admission: RE | Admit: 2014-06-09 | Discharge: 2014-06-11 | DRG: 392 | Disposition: A | Discharge status: Home / Self Care | Payer: BLUE CROSS/BLUE SHIELD | Source: Ambulatory Visit | Attending: General Surgery | Admitting: General Surgery

## 2014-06-09 DIAGNOSIS — K3189 Other diseases of stomach and duodenum: Secondary | ICD-10-CM | POA: Diagnosis present

## 2014-06-09 DIAGNOSIS — M1990 Unspecified osteoarthritis, unspecified site: Secondary | ICD-10-CM | POA: Diagnosis present

## 2014-06-09 DIAGNOSIS — K319 Disease of stomach and duodenum, unspecified: Principal | ICD-10-CM | POA: Diagnosis present

## 2014-06-09 DIAGNOSIS — K219 Gastro-esophageal reflux disease without esophagitis: Secondary | ICD-10-CM | POA: Diagnosis present

## 2014-06-09 DIAGNOSIS — K8020 Calculus of gallbladder without cholecystitis without obstruction: Secondary | ICD-10-CM | POA: Diagnosis present

## 2014-06-09 HISTORY — PX: LAPAROSCOPIC GASTRIC RESECTION: SHX1936

## 2014-06-09 LAB — CBC
HCT: 39.7 % (ref 36.0–46.0)
Hemoglobin: 12.5 g/dL (ref 12.0–15.0)
MCH: 27.6 pg (ref 26.0–34.0)
MCHC: 31.5 g/dL (ref 30.0–36.0)
MCV: 87.6 fL (ref 78.0–100.0)
Platelets: 271 10*3/uL (ref 150–400)
RBC: 4.53 MIL/uL (ref 3.87–5.11)
RDW: 13.6 % (ref 11.5–15.5)
WBC: 10.2 10*3/uL (ref 4.0–10.5)

## 2014-06-09 LAB — CREATININE, SERUM
Creatinine, Ser: 0.57 mg/dL (ref 0.50–1.10)
GFR calc Af Amer: >90 mL/min (ref >90)
GFR calc non Af Amer: >90 mL/min (ref >90)

## 2014-06-09 LAB — TYPE AND SCREEN
ABO/RH(D): O POS
Antibody Screen: NEGATIVE

## 2014-06-09 LAB — ABO/RH: ABO/RH(D): O POS

## 2014-06-09 SURGERY — LAPAROSCOPIC GASTRIC RESECTION
Anesthesia: General | Site: Abdomen

## 2014-06-09 MED ORDER — LIDOCAINE HCL (CARDIAC) 20 MG/ML IV SOLN
INTRAVENOUS | Status: AC
  Filled 2014-06-09: qty 5

## 2014-06-09 MED ORDER — CHLORHEXIDINE GLUCONATE 4 % EX LIQD: 1.0000 "application " | Freq: Once | CUTANEOUS | Status: DC

## 2014-06-09 MED ORDER — KETOROLAC TROMETHAMINE 30 MG/ML IJ SOLN
INTRAMUSCULAR | Status: AC
  Filled 2014-06-09: qty 1

## 2014-06-09 MED ORDER — DEXAMETHASONE SODIUM PHOSPHATE 10 MG/ML IJ SOLN
INTRAMUSCULAR | Status: AC
  Filled 2014-06-09: qty 1

## 2014-06-09 MED ORDER — DEXAMETHASONE SODIUM PHOSPHATE 10 MG/ML IJ SOLN
INTRAMUSCULAR | Status: DC | PRN
  Administered 2014-06-09: 10 mg via INTRAVENOUS

## 2014-06-09 MED ORDER — ONDANSETRON HCL 4 MG/2ML IJ SOLN
INTRAMUSCULAR | Status: DC | PRN
  Administered 2014-06-09: 4 mg via INTRAVENOUS

## 2014-06-09 MED ORDER — KETOROLAC TROMETHAMINE 30 MG/ML IJ SOLN
30.0000 mg | Freq: Once | INTRAMUSCULAR | Status: AC
  Administered 2014-06-09: 30 mg via INTRAVENOUS

## 2014-06-09 MED ORDER — 0.9 % SODIUM CHLORIDE (POUR BTL) OPTIME
TOPICAL | Status: DC | PRN
  Administered 2014-06-09: 1000 mL

## 2014-06-09 MED ORDER — ACETAMINOPHEN 325 MG PO TABS: 325.0000 mg | ORAL_TABLET | ORAL | Status: DC | PRN

## 2014-06-09 MED ORDER — SUFENTANIL CITRATE 50 MCG/ML IV SOLN
INTRAVENOUS | Status: DC | PRN
  Administered 2014-06-09: 10 ug via INTRAVENOUS
  Administered 2014-06-09 (×2): 5 ug via INTRAVENOUS

## 2014-06-09 MED ORDER — FAMOTIDINE 20 MG PO TABS
20.0000 mg | ORAL_TABLET | Freq: Every day | ORAL | Status: DC
  Administered 2014-06-09 – 2014-06-10 (×2): 20 mg via ORAL
  Filled 2014-06-09 (×3): qty 1

## 2014-06-09 MED ORDER — OXYCODONE HCL 5 MG PO TABS: 5.0000 mg | ORAL_TABLET | Freq: Once | ORAL | Status: DC | PRN

## 2014-06-09 MED ORDER — BUPIVACAINE-EPINEPHRINE (PF) 0.25% -1:200000 IJ SOLN
INTRAMUSCULAR | Status: AC
  Filled 2014-06-09: qty 30

## 2014-06-09 MED ORDER — ONDANSETRON HCL 4 MG/2ML IJ SOLN
4.0000 mg | Freq: Four times a day (QID) | INTRAMUSCULAR | Status: DC | PRN
  Administered 2014-06-10: 4 mg via INTRAVENOUS
  Filled 2014-06-09: qty 2

## 2014-06-09 MED ORDER — BUPIVACAINE HCL (PF) 0.5 % IJ SOLN
INTRAMUSCULAR | Status: DC | PRN
  Administered 2014-06-09: 10 mL

## 2014-06-09 MED ORDER — NALOXONE HCL 0.4 MG/ML IJ SOLN
0.4000 mg | Freq: Once | INTRAMUSCULAR | Status: AC
  Administered 2014-06-09: 0.04 mg via INTRAVENOUS

## 2014-06-09 MED ORDER — LIDOCAINE HCL (CARDIAC) 20 MG/ML IV SOLN
INTRAVENOUS | Status: DC | PRN
  Administered 2014-06-09: 100 mg via INTRAVENOUS

## 2014-06-09 MED ORDER — LACTATED RINGERS IV SOLN
INTRAVENOUS | Status: DC
  Administered 2014-06-09: 1000 mL via INTRAVENOUS
  Administered 2014-06-09: 12:00:00 via INTRAVENOUS

## 2014-06-09 MED ORDER — MIDAZOLAM HCL 5 MG/5ML IJ SOLN
INTRAMUSCULAR | Status: DC | PRN
  Administered 2014-06-09: 2 mg via INTRAVENOUS

## 2014-06-09 MED ORDER — FERROUS FUMARATE 325 (106 FE) MG PO TABS
1.0000 | ORAL_TABLET | Freq: Every day | ORAL | Status: DC
  Administered 2014-06-10 – 2014-06-11 (×2): 106 mg via ORAL
  Filled 2014-06-09 (×3): qty 1

## 2014-06-09 MED ORDER — DEXTROSE-NACL 5-0.9 % IV SOLN
INTRAVENOUS | Status: DC
  Administered 2014-06-09: 18:00:00 via INTRAVENOUS
  Administered 2014-06-10 (×2): 100 mL/h via INTRAVENOUS
  Administered 2014-06-11: 01:00:00 via INTRAVENOUS

## 2014-06-09 MED ORDER — ROCURONIUM BROMIDE 100 MG/10ML IV SOLN
INTRAVENOUS | Status: DC | PRN
  Administered 2014-06-09: 40 mg via INTRAVENOUS

## 2014-06-09 MED ORDER — PANTOPRAZOLE SODIUM 40 MG PO TBEC
40.0000 mg | DELAYED_RELEASE_TABLET | Freq: Every day | ORAL | Status: DC
  Administered 2014-06-09 – 2014-06-11 (×3): 40 mg via ORAL
  Filled 2014-06-09 (×3): qty 1

## 2014-06-09 MED ORDER — HYDROMORPHONE HCL 1 MG/ML IJ SOLN: 0.2500 mg | INTRAMUSCULAR | Status: DC | PRN

## 2014-06-09 MED ORDER — CYCLOBENZAPRINE HCL 10 MG PO TABS
10.0000 mg | ORAL_TABLET | Freq: Two times a day (BID) | ORAL | Status: DC | PRN
  Administered 2014-06-10: 10 mg via ORAL
  Filled 2014-06-09: qty 1

## 2014-06-09 MED ORDER — SUFENTANIL CITRATE 50 MCG/ML IV SOLN
INTRAVENOUS | Status: AC
  Filled 2014-06-09: qty 1

## 2014-06-09 MED ORDER — LOSARTAN POTASSIUM 50 MG PO TABS
50.0000 mg | ORAL_TABLET | Freq: Every day | ORAL | Status: DC
  Administered 2014-06-10 – 2014-06-11 (×2): 50 mg via ORAL
  Filled 2014-06-09 (×3): qty 1

## 2014-06-09 MED ORDER — OXYCODONE HCL 5 MG/5ML PO SOLN: 5.0000 mg | Freq: Once | ORAL | Status: DC | PRN

## 2014-06-09 MED ORDER — VITAMIN D (ERGOCALCIFEROL) 1.25 MG (50000 UNIT) PO CAPS: 50000.0000 [IU] | ORAL_CAPSULE | ORAL | Status: DC

## 2014-06-09 MED ORDER — HYDROMORPHONE HCL 1 MG/ML IJ SOLN
1.0000 mg | INTRAMUSCULAR | Status: DC | PRN
  Administered 2014-06-10: 1 mg via INTRAVENOUS
  Filled 2014-06-09: qty 1

## 2014-06-09 MED ORDER — ACETAMINOPHEN 10 MG/ML IV SOLN
1000.0000 mg | Freq: Once | INTRAVENOUS | Status: AC
  Filled 2014-06-09: qty 100

## 2014-06-09 MED ORDER — HYDROCHLOROTHIAZIDE 12.5 MG PO CAPS
12.5000 mg | ORAL_CAPSULE | Freq: Every day | ORAL | Status: DC
  Administered 2014-06-10 – 2014-06-11 (×2): 12.5 mg via ORAL
  Filled 2014-06-09 (×3): qty 1

## 2014-06-09 MED ORDER — FLUMAZENIL 0.5 MG/5ML IV SOLN
INTRAVENOUS | Status: AC
  Filled 2014-06-09: qty 5

## 2014-06-09 MED ORDER — ENOXAPARIN SODIUM 40 MG/0.4ML ~~LOC~~ SOLN
40.0000 mg | SUBCUTANEOUS | Status: DC
  Administered 2014-06-10 – 2014-06-11 (×2): 40 mg via SUBCUTANEOUS
  Filled 2014-06-09 (×2): qty 0.4

## 2014-06-09 MED ORDER — CEFAZOLIN SODIUM-DEXTROSE 2-3 GM-% IV SOLR
INTRAVENOUS | Status: AC
  Filled 2014-06-09: qty 50

## 2014-06-09 MED ORDER — GLYCOPYRROLATE 0.2 MG/ML IJ SOLN
INTRAMUSCULAR | Status: DC | PRN
  Administered 2014-06-09: 0.6 mg via INTRAVENOUS

## 2014-06-09 MED ORDER — TRAMADOL HCL 50 MG PO TABS: 50.0000 mg | ORAL_TABLET | Freq: Four times a day (QID) | ORAL | Status: DC | PRN

## 2014-06-09 MED ORDER — ROCURONIUM BROMIDE 100 MG/10ML IV SOLN
INTRAVENOUS | Status: AC
  Filled 2014-06-09: qty 1

## 2014-06-09 MED ORDER — ONDANSETRON HCL 4 MG PO TABS
4.0000 mg | ORAL_TABLET | Freq: Three times a day (TID) | ORAL | Status: DC | PRN
Start: 2014-06-09 — End: 2014-06-11

## 2014-06-09 MED ORDER — ONDANSETRON HCL 4 MG/2ML IJ SOLN
INTRAMUSCULAR | Status: AC
  Filled 2014-06-09: qty 2

## 2014-06-09 MED ORDER — NALOXONE HCL 0.4 MG/ML IJ SOLN
INTRAMUSCULAR | Status: AC
  Administered 2014-06-09: 0.08 mg
  Filled 2014-06-09: qty 1

## 2014-06-09 MED ORDER — SUCCINYLCHOLINE CHLORIDE 20 MG/ML IJ SOLN
INTRAMUSCULAR | Status: DC | PRN
  Administered 2014-06-09: 100 mg via INTRAVENOUS

## 2014-06-09 MED ORDER — FLUMAZENIL 0.5 MG/5ML IV SOLN
0.2000 mg | Freq: Once | INTRAVENOUS | Status: AC
  Administered 2014-06-09: 0.2 mg via INTRAVENOUS

## 2014-06-09 MED ORDER — PROPOFOL 10 MG/ML IV BOLUS
INTRAVENOUS | Status: DC | PRN
  Administered 2014-06-09: 160 mg via INTRAVENOUS

## 2014-06-09 MED ORDER — CEFAZOLIN SODIUM-DEXTROSE 2-3 GM-% IV SOLR
2.0000 g | INTRAVENOUS | Status: AC
  Administered 2014-06-09: 2 g via INTRAVENOUS

## 2014-06-09 MED ORDER — LOSARTAN POTASSIUM-HCTZ 50-12.5 MG PO TABS: 1.0000 | ORAL_TABLET | Freq: Every day | ORAL | Status: DC

## 2014-06-09 MED ORDER — PHENYLEPHRINE 40 MCG/ML (10ML) SYRINGE FOR IV PUSH (FOR BLOOD PRESSURE SUPPORT)
PREFILLED_SYRINGE | INTRAVENOUS | Status: AC
  Filled 2014-06-09: qty 10

## 2014-06-09 MED ORDER — FLUTICASONE PROPIONATE 50 MCG/ACT NA SUSP: 1.0000 | Freq: Every day | NASAL | Status: DC | PRN

## 2014-06-09 MED ORDER — ACETAMINOPHEN 10 MG/ML IV SOLN
INTRAVENOUS | Status: DC | PRN
  Administered 2014-06-09: 1000 mg via INTRAVENOUS

## 2014-06-09 MED ORDER — LORATADINE 10 MG PO TABS
10.0000 mg | ORAL_TABLET | Freq: Every day | ORAL | Status: DC
  Administered 2014-06-10 – 2014-06-11 (×2): 10 mg via ORAL
  Filled 2014-06-09 (×3): qty 1

## 2014-06-09 MED ORDER — PROPOFOL 10 MG/ML IV BOLUS
INTRAVENOUS | Status: AC
  Filled 2014-06-09: qty 20

## 2014-06-09 MED ORDER — ACETAMINOPHEN 160 MG/5ML PO SOLN: 325.0000 mg | ORAL | Status: DC | PRN

## 2014-06-09 MED ORDER — MIDAZOLAM HCL 2 MG/2ML IJ SOLN
INTRAMUSCULAR | Status: AC
  Filled 2014-06-09: qty 2

## 2014-06-09 MED ORDER — NEOSTIGMINE METHYLSULFATE 10 MG/10ML IV SOLN
INTRAVENOUS | Status: DC | PRN
  Administered 2014-06-09: 4 mg via INTRAVENOUS

## 2014-06-09 MED ORDER — LACTATED RINGERS IR SOLN
Status: DC | PRN
  Administered 2014-06-09: 1000 mL

## 2014-06-09 SURGICAL SUPPLY — 61 items
APPLIER CLIP ROT 10 11.4 M/L (STAPLE)
BLADE HEX COATED 2.75 (ELECTRODE) ×2 IMPLANT
CLAMP ENDO BABCK 10MM (STAPLE) IMPLANT
CLIP APPLIE ROT 10 11.4 M/L (STAPLE) IMPLANT
COVER MAYO STAND STRL (DRAPES) IMPLANT
DEVICE TROCAR PUNCTURE CLOSURE (ENDOMECHANICALS) ×2 IMPLANT
DRAPE LAPAROSCOPIC ABDOMINAL (DRAPES) ×2 IMPLANT
DRAPE WARM FLUID 44X44 (DRAPE) IMPLANT
ELECT REM PT RETURN 9FT ADLT (ELECTROSURGICAL) ×2
ELECTRODE REM PT RTRN 9FT ADLT (ELECTROSURGICAL) ×1 IMPLANT
ENDOLOOP SUT PDS II  0 18 (SUTURE)
ENDOLOOP SUT PDS II 0 18 (SUTURE) IMPLANT
GAUZE SPONGE 4X4 12PLY STRL (GAUZE/BANDAGES/DRESSINGS) IMPLANT
GLOVE BIO SURGEON STRL SZ7 (GLOVE) ×2 IMPLANT
GLOVE BIOGEL PI IND STRL 7.0 (GLOVE) ×1 IMPLANT
GLOVE BIOGEL PI IND STRL 7.5 (GLOVE) ×1 IMPLANT
GLOVE BIOGEL PI INDICATOR 7.0 (GLOVE) ×1
GLOVE BIOGEL PI INDICATOR 7.5 (GLOVE) ×1
GOWN STRL REUS W/ TWL XL LVL3 (GOWN DISPOSABLE) IMPLANT
GOWN STRL REUS W/TWL LRG LVL3 (GOWN DISPOSABLE) IMPLANT
GOWN STRL REUS W/TWL XL LVL3 (GOWN DISPOSABLE) ×8 IMPLANT
KIT BASIN OR (CUSTOM PROCEDURE TRAY) ×2 IMPLANT
LIGASURE IMPACT 36 18CM CVD LR (INSTRUMENTS) IMPLANT
LIQUID BAND (GAUZE/BANDAGES/DRESSINGS) ×2 IMPLANT
MARKER SKIN DUAL TIP RULER LAB (MISCELLANEOUS) IMPLANT
NS IRRIG 1000ML POUR BTL (IV SOLUTION) ×4 IMPLANT
PENCIL BUTTON HOLSTER BLD 10FT (ELECTRODE) ×2 IMPLANT
POUCH RETRIEVAL ECOSAC 10 (ENDOMECHANICALS) ×1 IMPLANT
POUCH RETRIEVAL ECOSAC 10MM (ENDOMECHANICALS) ×1
POUCH SPECIMEN RETRIEVAL 10MM (ENDOMECHANICALS) IMPLANT
RELOAD STAPLER BLUE 60MM (STAPLE) ×2 IMPLANT
SCISSORS LAP 5X35 DISP (ENDOMECHANICALS) ×2 IMPLANT
SET IRRIG TUBING LAPAROSCOPIC (IRRIGATION / IRRIGATOR) ×2 IMPLANT
SHEARS HARMONIC ACE PLUS 36CM (ENDOMECHANICALS) IMPLANT
SPONGE LAP 18X18 X RAY DECT (DISPOSABLE) ×2 IMPLANT
STAPLE ECHEON FLEX 60 POW ENDO (STAPLE) ×2 IMPLANT
STAPLER 90 3.5 STAND SLIM (STAPLE)
STAPLER 90 3.5 STD SLIM (STAPLE) IMPLANT
STAPLER RELOAD BLUE 60MM (STAPLE) ×4
STAPLER VISISTAT 35W (STAPLE) ×2 IMPLANT
SUT MNCRL AB 4-0 PS2 18 (SUTURE) IMPLANT
SUT SILK 2 0 (SUTURE) ×1
SUT SILK 2 0 SH CR/8 (SUTURE) ×2 IMPLANT
SUT SILK 2-0 18XBRD TIE 12 (SUTURE) ×1 IMPLANT
SUT SILK 3 0 (SUTURE)
SUT SILK 3 0 SH CR/8 (SUTURE) IMPLANT
SUT SILK 3-0 18XBRD TIE 12 (SUTURE) IMPLANT
TIP INNERVISION DETACH 40FR (MISCELLANEOUS) IMPLANT
TIP INNERVISION DETACH 50FR (MISCELLANEOUS) IMPLANT
TIP INNERVISION DETACH 56FR (MISCELLANEOUS) IMPLANT
TIPS INNERVISION DETACH 40FR (MISCELLANEOUS)
TOWEL OR 17X26 10 PK STRL BLUE (TOWEL DISPOSABLE) ×2 IMPLANT
TRAY FOLEY CATH 14FRSI W/METER (CATHETERS) ×2 IMPLANT
TRAY LAPAROSCOPIC (CUSTOM PROCEDURE TRAY) ×2 IMPLANT
TROCAR BLADELESS OPT 5 75 (ENDOMECHANICALS) ×2 IMPLANT
TROCAR XCEL 12X100 BLDLESS (ENDOMECHANICALS) ×2 IMPLANT
TROCAR XCEL BLUNT TIP 100MML (ENDOMECHANICALS) IMPLANT
TROCAR XCEL NON-BLD 11X100MML (ENDOMECHANICALS) IMPLANT
TROCAR XCEL UNIV SLVE 11M 100M (ENDOMECHANICALS) IMPLANT
TUBING INSUFFLATION 10FT LAP (TUBING) ×2 IMPLANT
YANKAUER SUCT BULB TIP 10FT TU (MISCELLANEOUS) IMPLANT

## 2014-06-09 NOTE — H&P (Signed)
History of Present Illness Lisa Ok MD; 04/30/2014 9:55 AM) The patient is a 54 year old female who presents with a complaint of gist tumor. Gist tumor  The patient is a 54 year old female who is referred by Dr. Ardis Hughs for evaluation of a gastric mass.  Patient states that she has had attempts this year history of abdominal pain. She was previously diagnosed with gastritis on EGD in 2013. Patient recently underwent a and endoscopic ultrasound which revealed a round anterior wall gastric mass that communicates with the muscularis propria layer measuring 2.9 x 2.5 cm. 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  Diagnostic Studies History Lisa Briggs, [place]; 04/30/2014 9:23 AM) Colonoscopy never Mammogram within last year  Social History Lisa Briggs, [place]; 04/30/2014 9:23 AM) Caffeine use Carbonated beverages. No alcohol use No drug use Tobacco use Never smoker.  Family History Lisa Briggs, [place]; 04/30/2014 9:23 AM) Alcohol Abuse Brother, Family Members In [place], Father, Sister, Son. Anesthetic complications Family Members In General. Arthritis Family Members In General, Mother, Sister. Breast Cancer Sister. Cervical Cancer Sister. Colon Polyps Brother. Depression Family Members In General, Sister. Diabetes Mellitus Brother, Family Members In General. Hypertension Brother, Family Members In [place], Father, Mother, Sister.  Pregnancy / Birth History Lisa Briggs, [place]; 04/30/2014 9:23 AM) Age at menarche 74 years. Age of menopause 108-50 Maternal age 83-25 Para 1 Regular periods  Review of Systems ([place]; 04/30/2014 9:23 AM) General Present- Night  Sweats and Weight Loss. Not Present- Appetite Loss, Chills, Fatigue, Fever and Weight Gain. Skin Present- Dryness. Not Present- Change in Wart/Mole, Hives, Jaundice, New Lesions, Non-Healing Wounds, Rash and Ulcer. HEENT Present- Sore Throat and Wears glasses/contact lenses. Not Present- Earache, Hearing Loss, Hoarseness, Nose Bleed, Oral Ulcers, Ringing in the Ears, Seasonal Allergies, Sinus Pain, Visual Disturbances and Yellow Eyes. Cardiovascular Present- Leg Cramps and Swelling of Extremities. Not Present- Chest Pain, Difficulty Breathing Lying Down, Palpitations, Rapid Heart Rate and Shortness of Breath. Gastrointestinal Present- Nausea. Not Present- Abdominal Pain, Bloating, Bloody Stool, Change in Bowel Habits, Chronic diarrhea, Constipation, Difficulty Swallowing, Excessive gas, Gets full quickly at meals, Hemorrhoids, Indigestion, Rectal Pain and Vomiting. Female Genitourinary Present- Pelvic Pain. Not Present- Frequency, Nocturia, Painful Urination and Urgency. Musculoskeletal Present- Back Pain, Joint Pain, Muscle Pain and Muscle Weakness. Not Present- Joint Stiffness and Swelling of Extremities. Neurological Present- Numbness, Tingling and Trouble walking. Not Present- Decreased Memory, Fainting, Headaches, Seizures, Tremor and Weakness. Endocrine Present- Hot flashes. Not Present- Cold Intolerance, Excessive Hunger, Hair Changes, Heat Intolerance and New Diabetes.   Vitals [name] CMA; 04/30/2014 9:25 AM) 04/30/2014 9:24 AM Weight: 204.2 lb Height: 69in Body Surface Area: 2.12 m Body Mass Index: 30.15 kg/m    Physical Exam Lisa Ok MD; 04/30/2014 9:47 AM) General Mental Status-Alert. General Appearance-Consistent with stated age. Hydration-Well hydrated. Voice-Normal.  Head and Neck Head-normocephalic, atraumatic with no lesions or palpable masses. Trachea-midline. Thyroid Gland Characteristics - normal size and consistency.  Chest and Lung  Exam Chest and lung exam reveals -quiet, even and easy respiratory effort with no use of accessory muscles and on auscultation, normal breath sounds, no adventitious sounds and normal vocal resonance. Inspection Chest Wall - Normal. Back - normal.  Cardiovascular Cardiovascular examination reveals -normal heart sounds, regular rate and rhythm with no  murmurs and normal pedal pulses bilaterally.  Abdomen Inspection Inspection of the abdomen reveals - No Hernias. Skin - Scar - no surgical scars. Palpation/Percussion Palpation and Percussion of the abdomen reveal - Soft, Non Tender, No Rebound tenderness, No Rigidity (guarding) and No hepatosplenomegaly. Auscultation Auscultation of the abdomen reveals - Bowel sounds normal.  Neurologic Neurologic evaluation reveals -alert and oriented x 3 with no impairment of recent or remote memory. Mental Status-Normal.  Musculoskeletal Normal Exam - Left-Upper Extremity Strength Normal and Lower Extremity Strength Normal. Normal Exam - Right-Upper Extremity Strength Normal and Lower Extremity Strength Normal.    Assessment & Plan Lisa Ok MD; 04/30/2014 9:55 AM) GASTRIC MASS (537.9  K31.9) Impression: 53 year old female with gastric mass  1. 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 This is in the anterior wall of the proximal stomach. Pathology results revealed spindle cell mass. The patient like to proceed with a laparoscopic, EGD assisted GIST tumor resection. 2. I did discuss the risks and benefits of each procedure to specifically include gastric perforation, possible gastric leakage, possible need for further surgery in regards to her gastric tumor resection.

## 2014-06-09 NOTE — Transfer of Care (Signed)
Immediate Anesthesia Transfer of Care Note  [name]  Procedure(s) Performed: Procedure(s): LAPAROSCOPIC GASTRIC MASS RESECTION (N/A)  Patient Location: PACU  Anesthesia Type:General  Level of Consciousness: awake, alert , oriented and patient cooperative  Airway & Oxygen Therapy: Patient Spontanous Breathing and Patient connected to face mask oxygen  Post-op Assessment: Report given to RN, Post -op Vital signs reviewed and stable and Patient moving all extremities X 4  Post vital signs: stable  Last Vitals:  Filed Vitals:   06/09/14 0947  BP: 134/75  Pulse: 63  Temp: 36.3 C  Resp: 20    Complications: No apparent anesthesia complications

## 2014-06-09 NOTE — Anesthesia Postprocedure Evaluation (Signed)
  Anesthesia Post-op Note  [name]  Procedure(s) Performed: Procedure(s): LAPAROSCOPIC GASTRIC MASS RESECTION (N/A)  Patient Location: PACU  Anesthesia Type:General  Level of Consciousness: awake  Airway and Oxygen Therapy: Patient Spontanous Breathing  Post-op Pain: mild  Post-op Assessment: Post-op Vital signs reviewed, Patient's Cardiovascular Status Stable, Respiratory Function Stable, Patent Airway, No signs of Nausea or vomiting and Pain level controlled  Post-op Vital Signs: Reviewed and stable  Last Vitals:  Filed Vitals:   06/09/14 1722  BP: 132/64  Pulse: 60  Temp: 36.4 C  Resp: 16    Complications: No apparent anesthesia complications

## 2014-06-09 NOTE — Progress Notes (Signed)
PACU Nsg Note: since arrival into PACU, pt has demonstrated very shallow resp w/ occ bradypnea being noted, pt has remained very sedated, w/ BS being clear but diminished to absent in bases, pt POX reading has remained at 99-100% on 10l simple face mask, shortly after admission to PACU, ETCO2 monitoring initiated and reading have remained consistenly > 77mmHg, very aggressive stir-up regimen initiated, HOB elevated to high fowlers. MDA notified of pts presentation shortly after admission to PACU and plan of care discussed. After cont to monitor and cont with stimulation both tactile and verbal, pt has remained very sleepy to sedated presentation, pt able to raise head off bed for > 5 sec, but still will not take deep breaths and still unable to keep eyes open or even verbally answer nsg staff. Pt has lately been demonstrating bradypnea and cont to show ETCO2 readings > 25mmHg, again MDA updated of pts status and VS being observed along with resp and mental status, orders rec to administer Romazicon at low dose, allergy status reconfirmed along with pts home meds reviewed prior to administration. After 5 min, no change in respiratory or mental status has been noted, again MDA notified of no changes since Romazicon adminstration, Narcan now ordered and will administer.   Stormy Card, BSN, RN, RRT, CPAN, CCRN

## 2014-06-09 NOTE — Progress Notes (Signed)
Now more awake and alert. Stable VS postop. Irritable.Refused most of her meds orally, "I can not take them without food".

## 2014-06-09 NOTE — Anesthesia Preprocedure Evaluation (Signed)
Anesthesia Evaluation  Patient identified by MRN, date of birth, ID band Patient awake    Reviewed: Allergy & Precautions, NPO status , Patient's Chart, lab work & pertinent test results  History of Anesthesia Complications Negative for: history of anesthetic complications  Airway Mallampati: II  TM Distance: >3 FB Neck ROM: Full    Dental  (+) Teeth Intact   Pulmonary neg pulmonary ROS,  breath sounds clear to auscultation        Cardiovascular hypertension, Pt. on medications Rhythm:Regular     Neuro/Psych negative neurological ROS  negative psych ROS   GI/Hepatic Neg liver ROS, GERD-  ,  Endo/Other  negative endocrine ROS  Renal/GU negative Renal ROS     Musculoskeletal  (+) Arthritis -,   Abdominal   Peds  Hematology   Anesthesia Other Findings   Reproductive/Obstetrics                             Anesthesia Physical Anesthesia Plan  ASA: II  Anesthesia Plan: General   Post-op Pain Management:    Induction: Intravenous  Airway Management Planned: Oral ETT  Additional Equipment: None  Intra-op Plan:   Post-operative Plan: Extubation in OR  Informed Consent: I have reviewed the patients History and Physical, chart, labs and discussed the procedure including the risks, benefits and alternatives for the proposed anesthesia with the patient or authorized representative who has indicated his/her understanding and acceptance.   Dental advisory given  Plan Discussed with: CRNA and Surgeon  Anesthesia Plan Comments:         Anesthesia Quick Evaluation

## 2014-06-09 NOTE — Op Note (Signed)
06/09/2014  12:24 PM  PATIENT:  Lisa Briggs  54 y.o. female  PRE-OPERATIVE DIAGNOSIS:   gastric mass  POST-OPERATIVE DIAGNOSIS:  gastric mass  PROCEDURE:  Procedure(s): LAPAROSCOPIC GASTRIC MASS RESECTION (N/A)  SURGEON:  Surgeon(s) and Role:    * [name], MD - Primary  ASSISTANTS: Dr. Michael Boston   ANESTHESIA:   local and general  EBL:  Min Total I/O In: 1000 [I.V.:1000] Out: -   BLOOD ADMINISTERED:none  DRAINS: none   LOCAL MEDICATIONS USED:  BUPIVICAINE   SPECIMEN:  Source of Specimen:  Gastric mass  DISPOSITION OF SPECIMEN:  PATHOLOGY  COUNTS:  YES  TOURNIQUET:  * No tourniquets in log *  DICTATION: .Dragon Dictation After the patient was consented she was taken back to the operating room and placed in the supine position with bilateral SCDs in place. A Foley catheter was introduced. The patient was prepped and draped in the usual sterile fashion. A timeout was called and all facts were verified.  A Veress needle technique was used to insufflate the abdomen to 15 mmHg in the right upper quadrant. Subsequent to this a 5 mm trocar and camera were then placed intra-abdominally. There was no injury to any intra-abdominal organs. At this time a 12 mm inferior umbilical port was in place under direct visualization. Two 5 mm trochars and placed in the right lower quadrant direct visualization.  At this time the stomach was grasped and retracted inferiorly. There appeared to be a pedunculated mass in the posterior greater curvature just past the greater omentum. The greater omentum was incised. This allowed [place] to visualize the pedunculated mass. The mass measured approximately 3 x 3 cm. At this time a laparoscopic echelon stapler was then introduced into the abdomen. A blue load was fired approximately 0.5 cm from what appeared to be the base. A second firing of the stapler was used to complete the resection. The staple line was examined for hemostasis which was  excellent.  An equal sac was introduced into the abdomen and the specimen placed because sac. This was then removed in its entirety from the abdominal cavity. At this time a tunneler trocar was removed in an 0 Vicryl was used with the Endo Close device to reapproximate the fascia the umbilical port site 2. At this time the insufflation was evacuated. All trochars were removed. Skin was reapproximated and all port sites using 3-0 Monocryl subcuticular fashion. Skin was dressed with Dermabond. The patient was awakened from general anesthesia and was taken to the recovery room in stable condition.   PLAN OF CARE: Admit for overnight observation  PATIENT DISPOSITION:  PACU - hemodynamically stable.   Delay start of Pharmacological VTE agent (>24hrs) due to surgical blood loss or risk of bleeding: not applicable

## 2014-06-10 ENCOUNTER — Encounter (HOSPITAL_COMMUNITY): Payer: Self-pay | Admitting: General Surgery

## 2014-06-10 DIAGNOSIS — K319 Disease of stomach and duodenum, unspecified: Secondary | ICD-10-CM | POA: Diagnosis present

## 2014-06-10 DIAGNOSIS — K8020 Calculus of gallbladder without cholecystitis without obstruction: Secondary | ICD-10-CM | POA: Diagnosis present

## 2014-06-10 DIAGNOSIS — M1990 Unspecified osteoarthritis, unspecified site: Secondary | ICD-10-CM | POA: Diagnosis present

## 2014-06-10 DIAGNOSIS — K219 Gastro-esophageal reflux disease without esophagitis: Secondary | ICD-10-CM | POA: Diagnosis present

## 2014-06-10 MED ORDER — OXYCODONE-ACETAMINOPHEN 5-325 MG PO TABS
1.0000 | ORAL_TABLET | ORAL | Status: DC | PRN
  Administered 2014-06-11: 2 via ORAL
  Filled 2014-06-10: qty 2

## 2014-06-10 NOTE — Progress Notes (Signed)
1 Day Post-Op  Subjective: Pt doing well.  Min pain.  No nausea  Objective: Vital signs in last 24 hours: Temp:  [97.4 F (36.3 C)-98.1 F (36.7 C)] 97.4 F (36.3 C) (03/09 0626) Pulse Rate:  [44-63] 55 (03/09 0626) Resp:  [8-22] 16 (03/09 0626) BP: (112-142)/(58-77) 133/77 mmHg (03/09 0626) SpO2:  [99 %-100 %] 99 % (03/09 0626) Weight:  [198 lb (89.812 kg)] 198 lb (89.812 kg) (03/08 1012)    Intake/Output from previous day: 03/08 0701 - 03/09 0700 In: 2300 [I.V.:2300] Out: 1410 [Urine:1410] Intake/Output this shift:    General appearance: alert and cooperative GI: soft, non-tender; bowel sounds normal; no masses,  no organomegaly and wounds c/d/i  Lab Results:   Recent Labs  06/09/14 1758  WBC 10.2  HGB 12.5  HCT 39.7  PLT 271   BMET  Recent Labs  06/09/14 1758  CREATININE 0.57    Anti-infectives: Anti-infectives    Start     Dose/Rate Route Frequency Ordered Stop   06/09/14 1000  ceFAZolin (ANCEF) IVPB 2 g/50 mL premix     2 g 100 mL/hr over 30 Minutes Intravenous 60 min pre-op 06/09/14 0948 06/09/14 1144      Assessment/Plan: s/p Procedure(s): LAPAROSCOPIC GASTRIC MASS RESECTION (N/A) Will start CLD Mobilize pt      Rosario Jacks., Anne Hahn 06/10/2014

## 2014-06-10 NOTE — Progress Notes (Signed)
UR completed 

## 2014-06-11 MED ORDER — OXYCODONE-ACETAMINOPHEN 5-325 MG PO TABS: 1.0000 | ORAL_TABLET | ORAL | Status: DC | PRN

## 2014-06-11 NOTE — Discharge Instructions (Signed)
EATING AFTER YOUR ESOPHAGEAL SURGERY (Stomach Fundoplication, Hiatal Hernia repair, Achalasia surgery, etc)  After your esophageal surgery, expect some sticking with swallowing over the next 1-2 months.    If food sticks when you eat, it is called "dysphagia".  This is due to swelling around your esophagus at the wrap & hiatal diaphragm repair.  It will gradually ease off over the next few months.  To help you through this temporary phase, we start you out on a pureed (blenderized) diet.  Your first meal in the hospital was thin liquids.  You should have been given a pureed diet by the time you left the hospital.  We ask patients to stay on a pureed diet for the first 2-3 weeks to avoid anything getting "stuck" near your recent surgery.  Don't be alarmed if your ability to swallow doesn't progress according to this plan.  Everyone is different and some diets can advance more or less quickly.     Some BASIC RULES to follow are:  Maintain an upright position whenever eating or drinking.  Take small bites - just a teaspoon size bite at a time.  Eat slowly.  It may also help to eat only one food at a time.  Consider nibbling through smaller, more frequent meals & avoid the urge to eat BIG meals  Do not push through feelings of fullness, nausea, or bloatedness  Do not mix solid foods and liquids in the same mouthful  Try not to "wash foods down" with large gulps of liquids. Avoid carbonated (bubbly/fizzy) drinks.  Understand that it will be hard to burp and belch at first.  This gradually improves with time.  Expect to be more gassy/flatulent/bloated initially.  Walking will help you work through that.  Maalox/Gas-X can help as well.  Eat in a relaxed atmosphere & minimize distractions.  Avoid talking while eating.    Do not use straws.  Following each meal, sit in an upright position (90 degree angle) for 60 to 90 minutes.  Going for a short walk can help as well  If food does stick,  don't panic.  Try to relax and let the food pass on its own.  Sipping WARM LIQUID such as strong hot black tea can also help slide it down.   Be gradual in changes & use common sense:  -If you easily tolerating a certain "level" of foods, advance to the next level gradually -If you are having trouble swallowing a particular food, then avoid it.   -If food is sticking when you advance your diet, go back to thinner previous diet (the lower LEVEL) for 1-2 days.  LEVEL 1 = PUREED DIET  Do for the first 2 WEEKS AFTER SURGERY  -Foods in this group are pureed or blenderized to a smooth, mashed potato-like consistency.  -If necessary, the pureed foods can keep their shape with the addition of a thickening agent.   -Meat should be pureed to a smooth, pasty consistency.  Hot broth or gravy may be added to the pureed meat, approximately 1 oz. of liquid per 3 oz. serving of meat. -CAUTION:  If any foods do not puree into a smooth consistency, swallowing will be more difficult.  (For example, nuts or seeds sometimes do not blend well.)  Hot Foods Cold Foods  Pureed scrambled eggs and cheese Pureed cottage cheese  Baby cereals Thickened juices and nectars  Thinned cooked cereals (no lumps) Thickened milk or eggnog  Pureed [place] toast or pancakes Ensure  Mashed  potatoes Ice cream  Pureed parsley, au gratin, scalloped potatoes, candied sweet potatoes Fruit or [place] ice, sherbet  Pureed buttered or alfredo noodles Plain yogurt  Pureed vegetables (no corn or peas) Instant breakfast  Pureed soups and creamed soups Smooth pudding, mousse, custard  Pureed scalloped apples Whipped gelatin  Gravies Sugar, syrup, honey, jelly  Sauces, cheese, tomato, barbecue, white, creamed Cream  Any baby food Creamer  Alcohol in moderation (not beer or champagne) Margarine  Coffee or tea Mayonnaise   Ketchup, mustard   Apple sauce   SAMPLE MENU:  PUREED DIET Breakfast Lunch Dinner   Orange juice, 1/2  cup  Cream of wheat, 1/2 cup  Pineapple juice, 1/2 cup  Pureed [place], barley soup, 3/4 cup  Pureed Hawaiian chicken, 3 oz   Scrambled eggs, mashed or blended with cheese, 1/2 cup  Tea or coffee, 1 cup   Whole milk, 1 cup   Non-dairy creamer, 2 Tbsp.  Mashed potatoes, 1/2 cup  Pureed cooled broccoli, 1/2 cup  Apple sauce, 1/2 cup  Coffee or tea  Mashed potatoes, 1/2 cup  Pureed spinach, 1/2 cup  Frozen yogurt, 1/2 cup  Tea or coffee      LEVEL 2 = SOFT DIET  After your first 2 weeks, you can advance to a soft diet.   Keep on this diet until everything goes down easily.  Hot Foods Cold Foods  White fish Cottage cheese  Stuffed fish Junior baby fruit  Baby food meals Semi thickened juices  Minced soft cooked, scrambled, poached eggs nectars  Souffle & omelets Ripe mashed bananas  Cooked cereals Canned fruit, pineapple sauce, milk  potatoes Milkshake  Buttered or Alfredo noodles Custard  Cooked cooled vegetable Puddings, including tapioca  Sherbet Yogurt  Vegetable soup or alphabet soup Fruit ice, [place] ice  Gravies Whipped gelatin  Sugar, syrup, honey, jelly Junior baby desserts  Sauces:  Cheese, creamed, barbecue, tomato, white Cream  Coffee or tea Margarine   SAMPLE MENU:  LEVEL 2 Breakfast Lunch Dinner   Orange juice, 1/2 cup  Oatmeal, 1/2 cup  Scrambled eggs with cheese, 1/2 cup  Decaffeinated tea, 1 cup  Whole milk, 1 cup  Non-dairy creamer, 2 Tbsp  Pineapple juice, 1/2 cup  Minced beef, 3 oz  Gravy, 2 Tbsp  Mashed potatoes, 1/2 cup  Minced fresh broccoli, 1/2 cup  Applesauce, 1/2 cup  Coffee, 1 cup  [place], barley soup, 3/4 cup  Minced Hawaiian chicken, 3 oz  Mashed potatoes, 1/2 cup  Cooked spinach, 1/2 cup  Frozen yogurt, 1/2 cup  Non-dairy creamer, 2 Tbsp   If you have any questions please call our office at [place]: [phone].

## 2014-06-11 NOTE — Discharge Summary (Signed)
Physician Discharge Summary  Patient ID: Lisa Briggs MRN: [id number] DOB/AGE: 07/03/1960 54 y.o.  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  Pt was kept NPO until POD 1 and then started on CLD   She tolerated that well.  She had no further n/v while in the hosp.  Pt was ambulating well without any issues and had good pain control.  She was afebrile and deemed stable and Dc'd home  Consults: None  Significant Diagnostic Studies: none  Treatments: surgery: as above  Discharge Exam: Blood pressure 100/51, pulse 50, temperature 97.9 F (36.6 C), temperature source Oral, resp. rate 18, height 5\' 9"  (1.753 m), weight 198 lb (89.812 kg), last menstrual period 06/01/2013, SpO2 100 %. General appearance: alert and cooperative GI: soft, non-tender; bowel sounds normal; no masses,  no organomegaly and wound c/d/i  Disposition: 81-Discharged to home/self-care with a planned acute care hospital inpt readmission     Medication List    ASK your doctor about these medications        cyclobenzaprine 10 MG tablet  Commonly known as:  FLEXERIL  Take 10 mg by mouth 2 (two) times daily as needed for muscle spasms.     dexlansoprazole 60 MG capsule  Commonly known as:  DEXILANT  Take 1 capsule (60 mg total) by mouth daily.     famotidine 20 MG tablet  Commonly known as:  PEPCID  Take 1 tablet (20 mg total) by mouth at bedtime.     ferrous fumarate 325 (106 FE) MG Tabs tablet  Commonly known as:  HEMOCYTE  Take 1 tablet (106 mg of iron total) by mouth 2 (two) times daily.     fluticasone 50 MCG/ACT nasal spray  Commonly known as:  FLONASE  Place 1 spray into both nostrils daily as needed for allergies or rhinitis.     loratadine 10 MG tablet  Commonly known as:  CLARITIN  Take 1 tablet  (10 mg total) by mouth daily.     losartan-hydrochlorothiazide 50-12.5 MG per tablet  Commonly known as:  HYZAAR  Take 1 tablet by mouth daily.     meloxicam 15 MG tablet  Commonly known as:  MOBIC  TAKE ONE TABLET BY MOUTH  DAILY AS NEEDED FOR PAIN     ondansetron 4 MG tablet  Commonly known as:  ZOFRAN  Take 1 tablet (4 mg total) by mouth every 8 (eight) hours as needed for nausea or vomiting.     polyethylene glycol powder powder  Commonly known as:  GLYCOLAX/MIRALAX  DISSOLVE ONE CAPFUL IN 8 OUNCES OF WATER AND TAKE DAILY     traMADol 50 MG tablet  Commonly known as:  ULTRAM  Take 50 mg by mouth every 6 (six) hours as needed for moderate pain.     Vitamin D (Ergocalciferol) 50000 UNITS Caps capsule  Commonly known as:  DRISDOL  Take 1 capsule (50,000 Units total) by mouth every 7 (seven) days.           Follow-up Information    Follow up with Reyes Ivan, MD. Schedule an appointment as soon as possible for a visit in 2 weeks.   Specialty:  General Surgery   Why:  For wound re-check   Contact information:   [address] [address] [phone]  Signed: Rosario Jacks., Johnisha Louks 06/11/2014, 6:29 AM

## 2014-06-11 NOTE — Progress Notes (Signed)
Nurse reviewed discharge instructions with pt.  Pt verbalized understanding of discharge instructions, follow up appointments, new medications.  No concerns at time of discharge. Prescription for pain medications given to pt prior to discharge.

## 2014-06-11 NOTE — Progress Notes (Signed)
2 Days Post-Op  Subjective: Pt doing well today. Tol CLD  Objective: Vital signs in last 24 hours: Temp:  [97.7 F (36.5 C)-98.6 F (37 C)] 97.9 F (36.6 C) (03/10 0600) Pulse Rate:  [47-54] 50 (03/10 0600) Resp:  [8-18] 18 (03/10 0600) BP: (100-128)/(49-64) 100/51 mmHg (03/10 0600) SpO2:  [100 %] 100 % (03/10 0600)    Intake/Output from previous day: 03/09 0701 - 03/10 0700 In: 2040 [P.O.:840; I.V.:1200] Out: 2150 [Urine:2150] Intake/Output this shift: Total I/O In: 1320 [P.O.:120; I.V.:1200] Out: 1350 [Urine:1350]  General appearance: alert and cooperative GI: soft, non-tender; bowel sounds normal; no masses,  no organomegaly and wound c/d/i  Lab Results:   Recent Labs  06/09/14 1758  WBC 10.2  HGB 12.5  HCT 39.7  PLT 271   BMET  Recent Labs  06/09/14 1758  CREATININE 0.57   Anti-infectives: Anti-infectives    Start     Dose/Rate Route Frequency Ordered Stop   06/09/14 1000  ceFAZolin (ANCEF) IVPB 2 g/50 mL premix     2 g 100 mL/hr over 30 Minutes Intravenous 60 min pre-op 06/09/14 0948 06/09/14 1144      Assessment/Plan: s/p Procedure(s): LAPAROSCOPIC GASTRIC MASS RESECTION (N/A) Con't with CLD Home later today   LOS: 1 day    Rosario Jacks., [facility] 06/11/2014

## 2014-06-17 ENCOUNTER — Other Ambulatory Visit: Payer: Self-pay | Admitting: Medical

## 2014-07-20 ENCOUNTER — Other Ambulatory Visit: Payer: Self-pay | Admitting: Family Medicine

## 2014-07-20 ENCOUNTER — Telehealth: Payer: Self-pay | Admitting: Medical

## 2014-07-20 DIAGNOSIS — R110 Nausea: Secondary | ICD-10-CM

## 2014-07-20 DIAGNOSIS — K2970 Gastritis, unspecified, without bleeding: Secondary | ICD-10-CM

## 2014-07-20 MED ORDER — VITAMIN D (ERGOCALCIFEROL) 1.25 MG (50000 UNIT) PO CAPS: 50000.0000 [IU] | ORAL_CAPSULE | ORAL | Status: DC

## 2014-07-20 MED ORDER — DEXLANSOPRAZOLE 60 MG PO CPDR: 60.0000 mg | DELAYED_RELEASE_CAPSULE | Freq: Every day | ORAL | Status: DC

## 2014-07-20 MED ORDER — POLYETHYLENE GLYCOL 3350 17 GM/SCOOP PO POWD: 1.0000 | Freq: Every day | ORAL | Status: DC

## 2014-07-20 MED ORDER — LOSARTAN POTASSIUM-HCTZ 50-12.5 MG PO TABS: 1.0000 | ORAL_TABLET | Freq: Every day | ORAL | Status: DC

## 2014-07-20 NOTE — Telephone Encounter (Signed)
Pt came in to the office stating that she is needing a refill on all scripts except sinus meds and bp meds. She could not remember the names of meds that she needed except Flonase.

## 2014-07-20 NOTE — Telephone Encounter (Signed)
I sent refills to her pharmacy

## 2014-07-31 ENCOUNTER — Telehealth: Payer: Self-pay | Admitting: Medical

## 2014-08-05 NOTE — Telephone Encounter (Signed)
She is seeing GI, so I unless they recently renewed or changed her GERD meds, then make appt., otherwise GI should be doing the prior [place]

## 2014-08-05 NOTE — Telephone Encounter (Signed)
P.A. Denied for Dexilant, pt must first try 2 covered alternatives...Marland KitchenMarland KitchenPrevacid, Nexium, Prilosec, Protonix, Aciphex,  Do you want to switch?

## 2014-08-06 NOTE — Telephone Encounter (Signed)
Tried to call pt, no answer, no answering machine. 

## 2014-08-10 ENCOUNTER — Telehealth: Payer: Self-pay | Admitting: Medical

## 2014-08-10 ENCOUNTER — Other Ambulatory Visit: Payer: Self-pay | Admitting: Medical

## 2014-08-10 DIAGNOSIS — R110 Nausea: Secondary | ICD-10-CM

## 2014-08-10 MED ORDER — CYCLOBENZAPRINE HCL 10 MG PO TABS: 10.0000 mg | ORAL_TABLET | Freq: Two times a day (BID) | ORAL | Status: DC | PRN

## 2014-08-10 MED ORDER — ONDANSETRON HCL 4 MG PO TABS: 4.0000 mg | ORAL_TABLET | Freq: Three times a day (TID) | ORAL | Status: DC | PRN

## 2014-08-10 NOTE — Telephone Encounter (Signed)
Pt states still hasn't gotten refills on Zofran or Flexeril from last month's phone call request.  Please refill

## 2014-08-10 NOTE — Telephone Encounter (Signed)
Pt returned my call states she is no longer seeing the GI doctor.  States they do not prescribe her meds. States that she has tried Nexium and Prevacid, so will resend P.A.

## 2014-08-11 NOTE — Telephone Encounter (Signed)
Pt called back & states pharmacy is running Rx thru husband's insurance.  Should be ran thru [employer] of [place] not [place]. Pharmacy reran and went thru with no copay.  Pt informed

## 2014-08-18 ENCOUNTER — Other Ambulatory Visit: Payer: Self-pay | Admitting: Medical

## 2014-09-02 DIAGNOSIS — R202 Paresthesia of skin: Secondary | ICD-10-CM

## 2014-09-02 HISTORY — DX: Paresthesia of skin: R20.2

## 2014-09-28 ENCOUNTER — Other Ambulatory Visit: Payer: Self-pay | Admitting: Medical

## 2014-09-29 ENCOUNTER — Ambulatory Visit (HOSPITAL_BASED_OUTPATIENT_CLINIC_OR_DEPARTMENT_OTHER): Payer: BLUE CROSS/BLUE SHIELD

## 2014-09-29 ENCOUNTER — Observation Stay (HOSPITAL_COMMUNITY): Payer: BLUE CROSS/BLUE SHIELD

## 2014-09-29 ENCOUNTER — Observation Stay (HOSPITAL_COMMUNITY)
Admission: EM | Admit: 2014-09-29 | Discharge: 2014-09-30 | Disposition: A | Discharge status: Home / Self Care | Payer: BLUE CROSS/BLUE SHIELD | Attending: Internal Medicine | Admitting: Internal Medicine

## 2014-09-29 ENCOUNTER — Encounter (HOSPITAL_COMMUNITY): Payer: Self-pay | Admitting: Emergency Medicine

## 2014-09-29 ENCOUNTER — Other Ambulatory Visit: Payer: Self-pay

## 2014-09-29 ENCOUNTER — Emergency Department (HOSPITAL_COMMUNITY): Payer: BLUE CROSS/BLUE SHIELD

## 2014-09-29 ENCOUNTER — Other Ambulatory Visit (HOSPITAL_COMMUNITY): Payer: BLUE CROSS/BLUE SHIELD

## 2014-09-29 DIAGNOSIS — I10 Essential (primary) hypertension: Secondary | ICD-10-CM | POA: Insufficient documentation

## 2014-09-29 DIAGNOSIS — M549 Dorsalgia, unspecified: Secondary | ICD-10-CM | POA: Diagnosis not present

## 2014-09-29 DIAGNOSIS — E785 Hyperlipidemia, unspecified: Secondary | ICD-10-CM | POA: Insufficient documentation

## 2014-09-29 DIAGNOSIS — E876 Hypokalemia: Secondary | ICD-10-CM | POA: Insufficient documentation

## 2014-09-29 DIAGNOSIS — Z79899 Other long term (current) drug therapy: Secondary | ICD-10-CM | POA: Diagnosis not present

## 2014-09-29 DIAGNOSIS — G459 Transient cerebral ischemic attack, unspecified: Secondary | ICD-10-CM

## 2014-09-29 DIAGNOSIS — D509 Iron deficiency anemia, unspecified: Secondary | ICD-10-CM | POA: Diagnosis not present

## 2014-09-29 DIAGNOSIS — K219 Gastro-esophageal reflux disease without esophagitis: Secondary | ICD-10-CM | POA: Insufficient documentation

## 2014-09-29 DIAGNOSIS — G8929 Other chronic pain: Secondary | ICD-10-CM | POA: Diagnosis not present

## 2014-09-29 DIAGNOSIS — M1990 Unspecified osteoarthritis, unspecified site: Secondary | ICD-10-CM | POA: Diagnosis not present

## 2014-09-29 DIAGNOSIS — I959 Hypotension, unspecified: Secondary | ICD-10-CM | POA: Insufficient documentation

## 2014-09-29 LAB — CBC WITH DIFFERENTIAL/PLATELET
Basophils Absolute: 0 10*3/uL (ref 0.0–0.1)
Basophils Relative: 0 % (ref 0–1)
Eosinophils Absolute: 0.1 10*3/uL (ref 0.0–0.7)
Eosinophils Relative: 1 % (ref 0–5)
HCT: 38.1 % (ref 36.0–46.0)
Hemoglobin: 12.2 g/dL (ref 12.0–15.0)
Lymphocytes Relative: 34 % (ref 12–46)
Lymphs Abs: 3.2 10*3/uL (ref 0.7–4.0)
MCH: 27.4 pg (ref 26.0–34.0)
MCHC: 32 g/dL (ref 30.0–36.0)
MCV: 85.4 fL (ref 78.0–100.0)
Monocytes Absolute: 0.6 10*3/uL (ref 0.1–1.0)
Monocytes Relative: 7 % (ref 3–12)
Neutro Abs: 5.4 10*3/uL (ref 1.7–7.7)
Neutrophils Relative %: 58 % (ref 43–77)
Platelets: 328 10*3/uL (ref 150–400)
RBC: 4.46 MIL/uL (ref 3.87–5.11)
RDW: 13.6 % (ref 11.5–15.5)
WBC: 9.2 10*3/uL (ref 4.0–10.5)

## 2014-09-29 LAB — COMPREHENSIVE METABOLIC PANEL
ALT: 16 U/L (ref 14–54)
AST: 21 U/L (ref 15–41)
Albumin: 4 g/dL (ref 3.5–5.0)
Alkaline Phosphatase: 122 U/L (ref 38–126)
Anion gap: 8 (ref 5–15)
BUN: 17 mg/dL (ref 6–20)
CO2: 27 mmol/L (ref 22–32)
Calcium: 9.3 mg/dL (ref 8.9–10.3)
Chloride: 102 mmol/L (ref 101–111)
Creatinine, Ser: 0.66 mg/dL (ref 0.44–1.00)
GFR calc Af Amer: >60 mL/min (ref >60)
GFR calc non Af Amer: >60 mL/min (ref >60)
Glucose, Bld: 92 mg/dL (ref 65–99)
Potassium: 2.9 mmol/L — ABNORMAL LOW (ref 3.5–5.1)
Sodium: 137 mmol/L (ref 135–145)
Total Bilirubin: 0.4 mg/dL (ref 0.3–1.2)
Total Protein: 7.2 g/dL (ref 6.5–8.1)

## 2014-09-29 LAB — URINALYSIS, ROUTINE W REFLEX MICROSCOPIC
Bilirubin Urine: NEGATIVE
Glucose, UA: NEGATIVE mg/dL
Hgb urine dipstick: NEGATIVE
Ketones, ur: NEGATIVE mg/dL
Leukocytes, UA: NEGATIVE
Nitrite: NEGATIVE
Protein, ur: NEGATIVE mg/dL
Specific Gravity, Urine: 1.017 (ref 1.005–1.030)
Urobilinogen, UA: 0.2 mg/dL (ref 0.0–1.0)
pH: 6.5 (ref 5.0–8.0)

## 2014-09-29 LAB — RAPID URINE DRUG SCREEN, HOSP PERFORMED
Amphetamines: NOT DETECTED
Barbiturates: NOT DETECTED
Benzodiazepines: NOT DETECTED
Cocaine: NOT DETECTED
Opiates: NOT DETECTED
Tetrahydrocannabinol: NOT DETECTED

## 2014-09-29 LAB — I-STAT CHEM 8, ED
BUN: 18 mg/dL (ref 6–20)
Calcium, Ion: 1.23 mmol/L (ref 1.12–1.23)
Chloride: 100 mmol/L — ABNORMAL LOW (ref 101–111)
Creatinine, Ser: 0.6 mg/dL (ref 0.44–1.00)
Glucose, Bld: 94 mg/dL (ref 65–99)
HCT: 40 % (ref 36.0–46.0)
Hemoglobin: 13.6 g/dL (ref 12.0–15.0)
Potassium: 3.3 mmol/L — ABNORMAL LOW (ref 3.5–5.1)
Sodium: 140 mmol/L (ref 135–145)
TCO2: 28 mmol/L (ref 0–100)

## 2014-09-29 LAB — I-STAT TROPONIN, ED: Troponin i, poc: 0 ng/mL (ref 0.00–0.08)

## 2014-09-29 LAB — PROTIME-INR
INR: 1.05 (ref 0.00–1.49)
Prothrombin Time: 13.9 seconds (ref 11.6–15.2)

## 2014-09-29 LAB — GLUCOSE, CAPILLARY
Glucose-Capillary: 82 mg/dL (ref 65–99)
Glucose-Capillary: 121 mg/dL — ABNORMAL HIGH (ref 65–99)

## 2014-09-29 LAB — ETHANOL: Alcohol, Ethyl (B): <5 mg/dL (ref <5)

## 2014-09-29 LAB — DIFFERENTIAL
Basophils Absolute: 0 10*3/uL (ref 0.0–0.1)
Basophils Relative: 0 % (ref 0–1)
Eosinophils Absolute: 0.1 10*3/uL (ref 0.0–0.7)
Eosinophils Relative: 2 % (ref 0–5)
Lymphocytes Relative: 38 % (ref 12–46)
Lymphs Abs: 3.2 10*3/uL (ref 0.7–4.0)
Monocytes Absolute: 0.5 10*3/uL (ref 0.1–1.0)
Monocytes Relative: 6 % (ref 3–12)
Neutro Abs: 4.6 10*3/uL (ref 1.7–7.7)
Neutrophils Relative %: 54 % (ref 43–77)

## 2014-09-29 LAB — APTT: aPTT: 29 seconds (ref 24–37)

## 2014-09-29 MED ORDER — SODIUM CHLORIDE 0.9 % IV SOLN
INTRAVENOUS | Status: DC
  Administered 2014-09-29: 14:00:00 via INTRAVENOUS
  Administered 2014-09-30: 100 mL/h via INTRAVENOUS

## 2014-09-29 MED ORDER — FLUTICASONE PROPIONATE 50 MCG/ACT NA SUSP
1.0000 | Freq: Every day | NASAL | Status: DC | PRN
  Filled 2014-09-29: qty 16

## 2014-09-29 MED ORDER — LORATADINE 10 MG PO TABS
10.0000 mg | ORAL_TABLET | Freq: Every day | ORAL | Status: DC
  Administered 2014-09-29 – 2014-09-30 (×2): 10 mg via ORAL
  Filled 2014-09-29 (×2): qty 1

## 2014-09-29 MED ORDER — HEPARIN SODIUM (PORCINE) 5000 UNIT/ML IJ SOLN
5000.0000 [IU] | Freq: Three times a day (TID) | INTRAMUSCULAR | Status: DC
  Administered 2014-09-29 – 2014-09-30 (×4): 5000 [IU] via SUBCUTANEOUS
  Filled 2014-09-29 (×4): qty 1

## 2014-09-29 MED ORDER — CYCLOBENZAPRINE HCL 10 MG PO TABS
10.0000 mg | ORAL_TABLET | Freq: Two times a day (BID) | ORAL | Status: DC | PRN
  Administered 2014-09-29: 10 mg via ORAL
  Filled 2014-09-29: qty 1

## 2014-09-29 MED ORDER — FAMOTIDINE 20 MG PO TABS
20.0000 mg | ORAL_TABLET | Freq: Every day | ORAL | Status: DC
  Administered 2014-09-29: 20 mg via ORAL
  Filled 2014-09-29: qty 1

## 2014-09-29 MED ORDER — FERROUS FUMARATE 325 (106 FE) MG PO TABS
1.0000 | ORAL_TABLET | Freq: Every day | ORAL | Status: DC
  Administered 2014-09-29 – 2014-09-30 (×2): 106 mg via ORAL
  Filled 2014-09-29 (×2): qty 1

## 2014-09-29 MED ORDER — ACETAMINOPHEN 325 MG PO TABS: 650.0000 mg | ORAL_TABLET | ORAL | Status: DC | PRN

## 2014-09-29 MED ORDER — STROKE: EARLY STAGES OF RECOVERY BOOK
Freq: Once | Status: AC
  Administered 2014-09-30: 1

## 2014-09-29 MED ORDER — MELOXICAM 7.5 MG PO TABS
7.5000 mg | ORAL_TABLET | Freq: Every day | ORAL | Status: DC
  Administered 2014-09-29 – 2014-09-30 (×2): 7.5 mg via ORAL
  Filled 2014-09-29 (×2): qty 1

## 2014-09-29 MED ORDER — PANTOPRAZOLE SODIUM 40 MG PO TBEC
40.0000 mg | DELAYED_RELEASE_TABLET | Freq: Every day | ORAL | Status: DC
  Administered 2014-09-29 – 2014-09-30 (×2): 40 mg via ORAL
  Filled 2014-09-29 (×2): qty 1

## 2014-09-29 MED ORDER — TRAMADOL HCL 50 MG PO TABS: 50.0000 mg | ORAL_TABLET | Freq: Four times a day (QID) | ORAL | Status: DC | PRN

## 2014-09-29 MED ORDER — POTASSIUM CHLORIDE CRYS ER 20 MEQ PO TBCR
40.0000 meq | EXTENDED_RELEASE_TABLET | Freq: Once | ORAL | Status: AC
  Administered 2014-09-29: 40 meq via ORAL
  Filled 2014-09-29: qty 2

## 2014-09-29 MED ORDER — ASPIRIN 325 MG PO TABS
325.0000 mg | ORAL_TABLET | Freq: Every day | ORAL | Status: DC
  Administered 2014-09-29 – 2014-09-30 (×2): 325 mg via ORAL
  Filled 2014-09-29 (×2): qty 1

## 2014-09-29 MED ORDER — POTASSIUM CHLORIDE CRYS ER 20 MEQ PO TBCR: 40.0000 meq | EXTENDED_RELEASE_TABLET | Freq: Once | ORAL | Status: DC

## 2014-09-29 NOTE — Progress Notes (Signed)
Recd patient to room via stretcher, pt ambulated to bed. Denies pain. Educated to room and and Chief Strategy Officer. Bed in low position, bed alarm on. Patient and husband refuses call bell in bed with patient- placed on bedside table.

## 2014-09-29 NOTE — H&P (Signed)
History and Physical        Hospital Admission Note Date: 09/29/2014  Patient name: Lisa Briggs Medical record number: [id number] Date of birth: 1960-11-24 Age: 54 y.o. Gender: female  PCP: Crisoforo Oxford, PA-C  Referring physician: Montine Circle, PA  Chief Complaint:  Facial numbness and tingling with left arm heaviness  HPI: Patient is a 54 year old female with hypertension, hyperlipidemia, GERD presented to ED with complaints of facial numbness and tingling with left arm heaviness since 1 AM. 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 She denied any difficulty swallowing or slurred speech or facial drooping. She denied any changes in the vision. She denied any prior history of TIA or stroke. Patient reported that her symptoms lasted for at least an hour and have resolved now.   Review of Systems:  Constitutional: Denies fever, chills, diaphoresis, poor appetite and fatigue.  HEENT: Denies photophobia, eye pain, redness, hearing loss, ear pain, congestion, sore throat, rhinorrhea, sneezing, mouth sores, trouble swallowing, neck pain, neck stiffness and tinnitus.   Respiratory: Denies SOB, DOE, cough, chest tightness,  and wheezing.   Cardiovascular: Denies chest pain, palpitations and leg swelling.  Gastrointestinal: Denies nausea, vomiting, abdominal pain, diarrhea, constipation, blood in stool and abdominal distention.  Genitourinary: Denies dysuria, urgency, frequency, hematuria, flank pain and difficulty urinating.  Musculoskeletal: Denies myalgias, back pain, joint swelling, arthralgias and gait problem.  Skin: Denies pallor, rash and wound.  Neurological: Please see history of present  illness Hematological: Denies adenopathy. Easy bruising, personal or family bleeding history  Psychiatric/Behavioral: Denies suicidal ideation, mood changes, confusion, nervousness, sleep disturbance and agitation  Past Medical History: Past Medical History  Diagnosis Date  . Hypertension   . GERD (gastroesophageal reflux disease)   . Allergy   . Farsightedness     wears glasses, [facility] center  . History of uterine fibroid   . Chronic back pain   . Polyarthralgia     normal rheumatoid screen 01/2012  . Constipation   . Chest pain 04/05/2011    cardiac eval, normal treadmill stress test, Dr. Tollie Eth  . Anemia     iron therapy for years as of 10/12; normal Hgb 08/2013  . Hyperlipidemia   . Arthritis     Past Surgical History  Procedure Laterality Date  . Lipoma excision      forehead  . Knee arthroscopy Left   . Uterine fibroid surgery    . Gall stone surgery    . Colonoscopy  01/2014    diverticulosis, othwerise normal - Dr. Owens Loffler  . Esophagogastroduodenoscopy  2013    Dr. Benson Norway, gastritis  . Esophagogastroduodenoscopy  [id number]  . Eus N/A 04/16/2014    Procedure: UPPER ENDOSCOPIC ULTRASOUND (EUS) LINEAR;  Surgeon: Milus Banister, MD;  Location: [facility] END[facility];  Service: Endoscopy;  Laterality: N/A;  . Laparoscopic gastric resection N/A 06/09/2014    Procedure: LAPAROSCOPIC GASTRIC MASS RESECTION;  Surgeon: [name], MD;  Location: [facility] ORS;  Service: General;  Laterality: N/A;  Medications: Prior to Admission medications   Medication Sig Start Date End Date Taking? Authorizing Provider  cyclobenzaprine (FLEXERIL) 10 MG tablet Take 1 tablet (10 mg total) by mouth 2 (two) times daily as needed for muscle spasms. 08/10/14  Yes Camelia Eng Tysinger, PA-C  dexlansoprazole (DEXILANT) 60 MG capsule Take 1 capsule (60 mg total) by mouth daily. 07/20/14  Yes Camelia Eng Tysinger, PA-C  famotidine (PEPCID) 20 MG tablet Take 1 tablet (20 mg total) by mouth at bedtime. 05/25/14   Yes Camelia Eng Tysinger, PA-C  ferrous fumarate (HEMOCYTE) 325 (106 FE) MG TABS tablet Take 1 tablet (106 mg of iron total) by mouth 2 (two) times daily. Patient taking differently: Take 1 tablet by mouth daily.  05/25/14  Yes Camelia Eng Tysinger, PA-C  fluticasone (FLONASE) 50 MCG/ACT nasal spray Place 1 spray into both nostrils daily as needed for allergies or rhinitis. 05/25/14  Yes Camelia Eng Tysinger, PA-C  loratadine (CLARITIN) 10 MG tablet Take 1 tablet (10 mg total) by mouth daily. 05/25/14  Yes Camelia Eng Tysinger, PA-C  losartan-hydrochlorothiazide (HYZAAR) 50-12.5 MG per tablet Take 1 tablet by mouth daily. 07/20/14  Yes Camelia Eng Tysinger, PA-C  meloxicam (MOBIC) 15 MG tablet TAKE ONE TABLET BY MOUTH  DAILY AS NEEDED FOR PAIN 05/25/14  Yes Camelia Eng Tysinger, PA-C  ondansetron (ZOFRAN) 4 MG tablet Take 1 tablet (4 mg total) by mouth every 8 (eight) hours as needed for nausea or vomiting. 08/10/14  Yes Camelia Eng Tysinger, PA-C  polyethylene glycol powder (GLYCOLAX/MIRALAX) powder DISSOLVE ONE CAPFUL IN 8 OUNCES OF WATER AND TAKE DAILY 09/28/14  Yes Camelia Eng Tysinger, PA-C  traMADol (ULTRAM) 50 MG tablet Take 50 mg by mouth every 6 (six) hours as needed for moderate pain.    Yes Historical Provider, MD  Vitamin D, Ergocalciferol, (DRISDOL) 50000 UNITS CAPS capsule Take 1 capsule (50,000 Units total) by mouth every 7 (seven) days. 07/20/14  Yes Camelia Eng Tysinger, PA-C    Allergies:  No Known Allergies  Social History:  reports that she has never smoked. She has never used smokeless tobacco. She reports that she does not drink alcohol or use illicit drugs.  Family History: Reviewed with the patient Family History  Problem Relation Age of Onset  . Hypertension Mother   . Arthritis Mother   . GER disease Mother   . Glaucoma Mother   . Stroke Father   . Alcohol abuse Father   . Breast cancer Sister     breast cancer dx late 48s; another sister with leukemia  . Hypertension Sister   . Heart disease Sister      heart murmur; other sisters with heart problems  . Diabetes Brother   . Diabetes Maternal Aunt   . Heart disease Maternal Aunt   . Heart disease Maternal Grandmother   . Heart disease Maternal Grandfather   . Heart disease Maternal Uncle   . Stroke Maternal Uncle   . Colon cancer Brother 77  . Colon polyps Neg Hx     Physical Exam: Blood pressure 102/59, pulse 65, temperature 97.7 F (36.5 C), temperature source Oral, resp. rate 15, weight 93.923 kg (207 lb 1 oz), last menstrual period 06/01/2013, SpO2 99 %. General: Alert, awake, oriented x3, in no acute distress. HEENT: normocephalic, atraumatic, anicteric sclera, pink conjunctiva, pupils equal and reactive to light and accomodation, oropharynx clear Neck: supple, no masses or lymphadenopathy, no goiter, no bruits  Heart: Regular rate and rhythm, without murmurs, rubs or gallops. Lungs:  Clear to auscultation bilaterally, no wheezing, rales or rhonchi. Abdomen: Soft, nontender, nondistended, positive bowel sounds, no masses. Extremities: No clubbing, cyanosis or edema with positive pedal pulses. Neuro: Grossly intact, no focal neurological deficits, strength 5/5 upper and lower extremities bilaterally Psych: alert and oriented x 3, normal mood and affect Skin: no rashes or lesions, warm and dry   LABS on Admission:  Basic Metabolic Panel:  Recent Labs Lab 09/29/14 0500 09/29/14 0749  NA 137 140  K 2.9* 3.3*  CL 102 100*  CO2 27  --   GLUCOSE 92 94  BUN 17 18  CREATININE 0.66 0.60  CALCIUM 9.3  --    Liver Function Tests:  Recent Labs Lab 09/29/14 0500  AST 21  ALT 16  ALKPHOS 122  BILITOT 0.4  PROT 7.2  ALBUMIN 4.0   No results for input(s): LIPASE, AMYLASE in the last 168 hours. No results for input(s): AMMONIA in the last 168 hours. CBC:  Recent Labs Lab 09/29/14 0500 09/29/14 0738 09/29/14 0749  WBC 9.2  --   --   NEUTROABS 5.4 4.6  --   HGB 12.2  --  13.6  HCT 38.1  --  40.0  MCV 85.4  --   --    PLT 328  --   --    Cardiac Enzymes: No results for input(s): CKTOTAL, CKMB, CKMBINDEX, TROPONINI in the last 168 hours. BNP: Invalid input(s): POCBNP CBG: No results for input(s): GLUCAP in the last 168 hours.  Radiological Exams on Admission:  Ct Head Wo Contrast  09/29/2014   CLINICAL DATA:  Intermittent facial tingling and finger numbness.  EXAM: CT HEAD WITHOUT CONTRAST  TECHNIQUE: Contiguous axial images were obtained from the base of the skull through the vertex without intravenous contrast.  COMPARISON:  None.  FINDINGS: No mass effect or midline shift. No evidence of acute intracranial hemorrhage, or infarction. No abnormal extra-axial fluid collections. Gray-white matter differentiation is normal. Basal cisterns are preserved.  No depressed skull fractures. Visualized paranasal sinuses and mastoid air cells are not opacified.  IMPRESSION: No CT evidence of acute intracranial abnormality.   Electronically Signed   By: Fidela Salisbury M.D.   On: 09/29/2014 08:55    *I have personally reviewed the images above*  EKG: Independently reviewed. Rate 67, normal sinus rhythm, prolonged QTC of 503, early repolarization changes   Assessment/Plan Principal Problem:   TIA (transient ischemic attack): With facial numbness and tingling, numbness and tingling in the fingers and feet and left arm heaviness, symptoms resolved -  Admitted for TIA, obtain MRI/MRA, 2-D echo, carotid Dopplers  - PT, OT, speech evaluation  - Hemoglobin A1c, lipid panel  - Place on aspirin 325 mg daily   Active Problems: Hypokalemia - Potassium was 2.9 at the time of admission, possibly can explain the numbness and tingling in her fingers and feet and facial numbness - Potassium is improved to 3.3, will hold losartan/HCTZ and give 1 more dose of potassium  Hypotension with history of essential hypertension - Placed on IV fluid hydration, hold losartan/HCTZ    Gastroesophageal reflux disease without  esophagitis - Continue PPI    Hyperlipidemia - Not on any statins, check lipid panel  DVT prophylaxis: Heparin subcutaneous  CODE STATUS:Full code   Family Communication: Admission, patients condition and plan of care including tests being ordered have been discussed with the patient who indicates understanding and agree with the plan and Code Status  Disposition plan: Further plan will depend as patient's  clinical course evolves and further radiologic and laboratory data become available.   Time Spent on Admission: 60 mins    Gisele Pack M.D. [facility] 09/29/2014, 10:39 AM Pager: [phone]  If 7PM-7AM, please contact night-coverage [email or web address] Password [email or web address]

## 2014-09-29 NOTE — ED Notes (Signed)
Attempted report 

## 2014-09-29 NOTE — ED Provider Notes (Signed)
CSN: [id number]     Arrival date & time 09/29/14  0413 History   First MD Initiated Contact with Patient 09/29/14 [phone]     Chief Complaint  Patient presents with  . Tingling     (Consider location/radiation/quality/duration/timing/severity/associated sxs/prior Treatment) HPI Comments: Patient with HTN, HL, GERD, presents to the ED with a chief complaint of numbness and tingling in her face.  She also reports associated intermittent numbness and weakness in her left arm.  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  She states that the symptoms spontaneously resolved.  She states that she has still had some numbness in her face.  She denies any vision changes, slurred speech, or facial droop.  She is accompanied by her husband, who denies seeing any facial asymmetry.  She denies and chest pain, SOB, abdominal pain, or other symptoms at this time.  There are no aggravating or alleviating factors.  The history is provided by the patient. No language interpreter was used.    Past Medical History  Diagnosis Date  . Hypertension   . GERD (gastroesophageal reflux disease)   . Allergy   . Farsightedness     wears glasses, [facility]  . History of uterine fibroid   . Chronic back pain   . Polyarthralgia     normal rheumatoid screen 01/2012  . Constipation   . Chest pain 04/05/2011    cardiac eval, normal treadmill stress test, Dr. Tollie Eth  . Anemia     iron therapy for years as of 10/12; normal Hgb 08/2013  . Hyperlipidemia   . Arthritis    Past Surgical History  Procedure Laterality Date  . Lipoma excision      forehead  . Knee arthroscopy Left   . Uterine fibroid surgery    . Gall stone surgery    . Colonoscopy  01/2014    diverticulosis, othwerise normal - Dr. Owens Loffler  . Esophagogastroduodenoscopy  2013    Dr. Benson Norway, gastritis  .  Esophagogastroduodenoscopy  [id number]  . Eus N/A 04/16/2014    Procedure: UPPER ENDOSCOPIC ULTRASOUND (EUS) LINEAR;  Surgeon: Milus Banister, MD;  Location: [facility];  Service: Endoscopy;  Laterality: N/A;  . Laparoscopic gastric resection N/A 06/09/2014    Procedure: LAPAROSCOPIC GASTRIC MASS RESECTION;  Surgeon: [name], MD;  Location: [facility];  Service: General;  Laterality: N/A;   Family History  Problem Relation Age of Onset  . Hypertension Mother   . Arthritis Mother   . GER disease Mother   . Glaucoma Mother   . Stroke Father   . Alcohol abuse Father   . Breast cancer Sister     breast cancer dx late 25s; another sister with leukemia  . Hypertension Sister   . Heart disease Sister     heart murmur; other sisters with heart problems  . Diabetes Brother   . Diabetes Maternal Aunt   . Heart disease Maternal Aunt   . Heart disease Maternal Grandmother   . Heart disease Maternal Grandfather   . Heart disease Maternal Uncle   . Stroke Maternal Uncle   . Colon cancer Brother 58  . Colon polyps Neg Hx    History  Substance Use Topics  . Smoking status: Never Smoker   . Smokeless tobacco: Never Used  . Alcohol Use: No   OB History    No data  available     Review of Systems  Constitutional: Negative for fever and chills.  Respiratory: Negative for shortness of breath.   Cardiovascular: Negative for chest pain.  Gastrointestinal: Negative for nausea, vomiting, diarrhea and constipation.  Genitourinary: Negative for dysuria.  Neurological: Positive for weakness and numbness.  All other systems reviewed and are negative.     Allergies  Review of patient's allergies indicates no known allergies.  Home Medications   Prior to Admission medications   Medication Sig Start Date End Date Taking? Authorizing Provider  cyclobenzaprine (FLEXERIL) 10 MG tablet Take 1 tablet (10 mg total) by mouth 2 (two) times daily as needed for muscle spasms. 08/10/14  Yes Camelia Eng  Tysinger, PA-C  dexlansoprazole (DEXILANT) 60 MG capsule Take 1 capsule (60 mg total) by mouth daily. 07/20/14  Yes Camelia Eng Tysinger, PA-C  famotidine (PEPCID) 20 MG tablet Take 1 tablet (20 mg total) by mouth at bedtime. 05/25/14  Yes Camelia Eng Tysinger, PA-C  ferrous fumarate (HEMOCYTE) 325 (106 FE) MG TABS tablet Take 1 tablet (106 mg of iron total) by mouth 2 (two) times daily. Patient taking differently: Take 1 tablet by mouth daily.  05/25/14  Yes Camelia Eng Tysinger, PA-C  fluticasone (FLONASE) 50 MCG/ACT nasal spray Place 1 spray into both nostrils daily as needed for allergies or rhinitis. 05/25/14  Yes Camelia Eng Tysinger, PA-C  loratadine (CLARITIN) 10 MG tablet Take 1 tablet (10 mg total) by mouth daily. 05/25/14  Yes Camelia Eng Tysinger, PA-C  losartan-hydrochlorothiazide (HYZAAR) 50-12.5 MG per tablet Take 1 tablet by mouth daily. 07/20/14  Yes Camelia Eng Tysinger, PA-C  meloxicam (MOBIC) 15 MG tablet TAKE ONE TABLET BY MOUTH  DAILY AS NEEDED FOR PAIN 05/25/14  Yes Camelia Eng Tysinger, PA-C  ondansetron (ZOFRAN) 4 MG tablet Take 1 tablet (4 mg total) by mouth every 8 (eight) hours as needed for nausea or vomiting. 08/10/14  Yes Camelia Eng Tysinger, PA-C  polyethylene glycol powder (GLYCOLAX/MIRALAX) powder DISSOLVE ONE CAPFUL IN 8 OUNCES OF WATER AND TAKE DAILY 09/28/14  Yes Camelia Eng Tysinger, PA-C  traMADol (ULTRAM) 50 MG tablet Take 50 mg by mouth every 6 (six) hours as needed for moderate pain.    Yes Historical Provider, MD  Vitamin D, Ergocalciferol, (DRISDOL) 50000 UNITS CAPS capsule Take 1 capsule (50,000 Units total) by mouth every 7 (seven) days. 07/20/14  Yes David S Tysinger, PA-C   BP 114/71 mmHg  Pulse 64  Temp(Src) 97.7 F (36.5 C) (Oral)  Resp 12  Wt 207 lb 1 oz (93.923 kg)  SpO2 100%  LMP 06/01/2013 Physical Exam  Constitutional: She is oriented to person, place, and time. She appears well-developed and well-nourished.  HENT:  Head: Normocephalic and atraumatic.  Eyes: Conjunctivae and EOM  are normal. Pupils are equal, round, and reactive to light.  Neck: Normal range of motion. Neck supple.  Cardiovascular: Normal rate and regular rhythm.  Exam reveals no gallop and no friction rub.   No murmur heard. Pulmonary/Chest: Effort normal and breath sounds normal. No respiratory distress. She has no wheezes. She has no rales. She exhibits no tenderness.  Abdominal: Soft. Bowel sounds are normal. She exhibits no distension and no mass. There is no tenderness. There is no rebound and no guarding.  Musculoskeletal: Normal range of motion. She exhibits no edema or tenderness.  Neurological: She is alert and oriented to person, place, and time.  CN 3-12 intact, speech is clear, movements are goal oriented, normal finger to nose, no  pronator drift  Skin: Skin is warm and dry.  Psychiatric: She has a normal mood and affect. Her behavior is normal. Judgment and thought content normal.  Nursing note and vitals reviewed.   ED Course  Procedures (including critical care time) Results for orders placed or performed during the hospital encounter of 09/29/14  CBC with Differential  Result Value Ref Range   WBC 9.2 4.0 - 10.5 K/uL   RBC 4.46 3.87 - 5.11 MIL/uL   Hemoglobin 12.2 12.0 - 15.0 g/dL   HCT 38.1 36.0 - 46.0 %   MCV 85.4 78.0 - 100.0 fL   MCH 27.4 26.0 - 34.0 pg   MCHC 32.0 30.0 - 36.0 g/dL   RDW 13.6 11.5 - 15.5 %   Platelets 328 150 - 400 K/uL   Neutrophils Relative % 58 43 - 77 %   Neutro Abs 5.4 1.7 - 7.7 K/uL   Lymphocytes Relative 34 12 - 46 %   Lymphs Abs 3.2 0.7 - 4.0 K/uL   Monocytes Relative 7 3 - 12 %   Monocytes Absolute 0.6 0.1 - 1.0 K/uL   Eosinophils Relative 1 0 - 5 %   Eosinophils Absolute 0.1 0.0 - 0.7 K/uL   Basophils Relative 0 0 - 1 %   Basophils Absolute 0.0 0.0 - 0.1 K/uL  Comprehensive metabolic panel  Result Value Ref Range   Sodium 137 135 - 145 mmol/L   Potassium 2.9 (L) 3.5 - 5.1 mmol/L   Chloride 102 101 - 111 mmol/L   CO2 27 22 - 32 mmol/L    Glucose, Bld 92 65 - 99 mg/dL   BUN 17 6 - 20 mg/dL   Creatinine, Ser 0.66 0.44 - 1.00 mg/dL   Calcium 9.3 8.9 - 10.3 mg/dL   Total Protein 7.2 6.5 - 8.1 g/dL   Albumin 4.0 3.5 - 5.0 g/dL   AST 21 15 - 41 U/L   ALT 16 14 - 54 U/L   Alkaline Phosphatase 122 38 - 126 U/L   Total Bilirubin 0.4 0.3 - 1.2 mg/dL   GFR calc non Af Amer >60 >60 mL/min   GFR calc Af Amer >60 >60 mL/min   Anion gap 8 5 - 15  Ethanol  Result Value Ref Range   Alcohol, Ethyl (B) <5 <5 mg/dL  Protime-INR  Result Value Ref Range   Prothrombin Time 13.9 11.6 - 15.2 seconds   INR 1.05 0.00 - 1.49  APTT  Result Value Ref Range   aPTT 29 24 - 37 seconds  Differential  Result Value Ref Range   Neutrophils Relative % 54 43 - 77 %   Neutro Abs 4.6 1.7 - 7.7 K/uL   Lymphocytes Relative 38 12 - 46 %   Lymphs Abs 3.2 0.7 - 4.0 K/uL   Monocytes Relative 6 3 - 12 %   Monocytes Absolute 0.5 0.1 - 1.0 K/uL   Eosinophils Relative 2 0 - 5 %   Eosinophils Absolute 0.1 0.0 - 0.7 K/uL   Basophils Relative 0 0 - 1 %   Basophils Absolute 0.0 0.0 - 0.1 K/uL  Urine rapid drug screen (hosp performed)not at [facility]  Result Value Ref Range   Opiates NONE DETECTED NONE DETECTED   Cocaine NONE DETECTED NONE DETECTED   Benzodiazepines NONE DETECTED NONE DETECTED   Amphetamines NONE DETECTED NONE DETECTED   Tetrahydrocannabinol NONE DETECTED NONE DETECTED   Barbiturates NONE DETECTED NONE DETECTED  Urinalysis, Routine w reflex microscopic (not at [facility])  Result  Value Ref Range   Color, Urine YELLOW YELLOW   APPearance CLEAR CLEAR   Specific Gravity, Urine 1.017 1.005 - 1.030   pH 6.5 5.0 - 8.0   Glucose, UA NEGATIVE NEGATIVE mg/dL   Hgb urine dipstick NEGATIVE NEGATIVE   Bilirubin Urine NEGATIVE NEGATIVE   Ketones, ur NEGATIVE NEGATIVE mg/dL   Protein, ur NEGATIVE NEGATIVE mg/dL   Urobilinogen, UA 0.2 0.0 - 1.0 mg/dL   Nitrite NEGATIVE NEGATIVE   Leukocytes, UA NEGATIVE NEGATIVE  I-Stat Chem 8, ED  (not at [facility])   Result Value Ref Range   Sodium 140 135 - 145 mmol/L   Potassium 3.3 (L) 3.5 - 5.1 mmol/L   Chloride 100 (L) 101 - 111 mmol/L   BUN 18 6 - 20 mg/dL   Creatinine, Ser 0.60 0.44 - 1.00 mg/dL   Glucose, Bld 94 65 - 99 mg/dL   Calcium, Ion 1.23 1.12 - 1.23 mmol/L   TCO2 28 0 - 100 mmol/L   Hemoglobin 13.6 12.0 - 15.0 g/dL   HCT 40.0 36.0 - 46.0 %  I-stat troponin, ED (not at [facility])  Result Value Ref Range   Troponin i, poc 0.00 0.00 - 0.08 ng/mL   Comment 3           Ct Head Wo Contrast  09/29/2014   CLINICAL DATA:  Intermittent facial tingling and finger numbness.  EXAM: CT HEAD WITHOUT CONTRAST  TECHNIQUE: Contiguous axial images were obtained from the base of the skull through the vertex without intravenous contrast.  COMPARISON:  None.  FINDINGS: No mass effect or midline shift. No evidence of acute intracranial hemorrhage, or infarction. No abnormal extra-axial fluid collections. Gray-white matter differentiation is normal. Basal cisterns are preserved.  No depressed skull fractures. Visualized paranasal sinuses and mastoid air cells are not opacified.  IMPRESSION: No CT evidence of acute intracranial abnormality.   Electronically Signed   By: Fidela Salisbury M.D.   On: 09/29/2014 08:55      EKG Interpretation   Date/Time:  Tuesday September 29 2014 07:21:59 EDT Ventricular Rate:  67 PR Interval:  156 QRS Duration: 97 QT Interval:  476 QTC Calculation: 503 R Axis:   64 Text Interpretation:  Sinus rhythm Probable left ventricular hypertrophy  Nonspecific T abnrm, anterolateral leads ST elev, probable normal early  repol pattern Borderline prolonged QT interval When compared with ECG of  09/29/2014, No significant change was found Confirmed by [facility]  MD, DAVID  ([id number]) on 09/29/2014 7:33:10 AM      MDM   Final diagnoses:  Transient cerebral ischemia, unspecified transient cerebral ischemia type    Patient with left arm weakness/heaviness that started last night and then  resolved.  Still has some numbness sensation in her face.  Otherwise, neurologically intact.  Will check head CT and labs.  Will reassess.  Patient seen by and discussed with Dr. Mingo Amber, who states that the patient told him that she had some slurred speech, which has since resolved.  Agrees with plan for TIA workup.  9:22 AM CT is negative.  Discussed plan with Dr. Mingo Amber, who recommends observation admission and TIA workup/rule out.  Will talk with neurology and admit to medicine.  Dr. Mingo Amber spoke with neurology, who will see the patient.  Appreciate Dr. Tana Coast for admitting the patient.   Montine Circle, PA-C 09/29/14 [place], MD 09/29/14 1515

## 2014-09-29 NOTE — Progress Notes (Signed)
Patient started on AC/HS CBGs. Amion text sent as Patient stating" I am not diabetic."

## 2014-09-29 NOTE — ED Notes (Addendum)
Pt. woke up this morning with intermittent  facial tingling and fingers numbness , speech clear with no facial asymmetry , equal grips with no arm drift , alert and oriented / respirations unlabored .

## 2014-09-29 NOTE — Progress Notes (Signed)
*  PRELIMINARY RESULTS* Vascular Ultrasound Carotid Duplex (Doppler) has been completed.   Findings suggest 1-39% internal carotid artery stenosis bilaterally. Vertebral arteries are patent with antegrade flow.  09/29/2014 4:36 PM Maudry Mayhew, RVT, RDCS, RDMS

## 2014-09-29 NOTE — Evaluation (Signed)
Physical Therapy Evaluation Patient Details Name: Lisa Briggs MRN: [id number] DOB: 1960/05/02 Today's Date: 09/29/2014   History of Present Illness  54 year old female with hypertension, hyperlipidemia, GERD presented to ED with complaints of facial numbness and tingling with left arm heaviness. Neuro work-up underway, possible TIA.  Clinical Impression  Patient mobilizing well, ambulated in hall, no physical assist required, patient able to perform stair negotiation and demonstrates no significant deficits in strength or function at this time. Patient reports intermittent RLE weakness after long days of work on occasion, but not at this time. Educated patient on safety with mobility in regarding to RLE weakness/pain. Patient receptive. No further acute PT needs, will sign off.     Follow Up Recommendations No PT follow up;Supervision - Intermittent    Equipment Recommendations  None recommended by PT    Recommendations for Other Services       Precautions / Restrictions        Mobility  Bed Mobility Overal bed mobility: Independent                Transfers Overall transfer level: Independent                  Ambulation/Gait Ambulation/Gait assistance: Independent Ambulation Distance (Feet): 340 Feet Assistive device: None Gait Pattern/deviations: WFL(Within Functional Limits) Gait velocity: decreased   General Gait Details: steady with ambulation despite slow cadence  Stairs Stairs: Yes Stairs assistance: Modified independent (Device/Increase time) Stair Management: One rail Right;Alternating pattern;Forwards Number of Stairs: 5 General stair comments: performed x2, educated on technique for stair negotiation when RLE bothering patient  Wheelchair Mobility    Modified Rankin (Stroke Patients Only) Modified Rankin (Stroke Patients Only) Pre-Morbid Rankin Score: No symptoms Modified Rankin: No significant disability     Balance Overall balance  assessment: No apparent balance deficits (not formally assessed)                                           Pertinent Vitals/Pain Pain Assessment: No/denies pain    Home Living Family/patient expects to be discharged to:: Private residence Living Arrangements: Spouse/significant other Available Help at Discharge: Family Type of Home: House Home Access: Stairs to enter Entrance Stairs-Rails: Can reach both Entrance Stairs-Number of Steps: 4 Home Layout: Two level;Bed/bath upstairs Home Equipment: None      Prior Function Level of Independence: Independent         Comments: was working at time of event     Hand Dominance   Dominant Hand: Right    Extremity/Trunk Assessment   Upper Extremity Assessment: Overall WFL for tasks assessed           Lower Extremity Assessment: Overall WFL for tasks assessed (edema behind medial aspect R knee)         Communication   Communication: No difficulties  Cognition Arousal/Alertness: Awake/alert Behavior During Therapy: WFL for tasks assessed/performed Overall Cognitive Status: Within Functional Limits for tasks assessed                      General Comments      Exercises        Assessment/Plan    PT Assessment Patent does not need any further PT services  PT Diagnosis Difficulty walking   PT Problem List    PT Treatment Interventions     PT Goals (Current goals can be found  in the Care Plan section) Acute Rehab PT Goals PT Goal Formulation: All assessment and education complete, DC therapy    Frequency     Barriers to discharge        Co-evaluation               End of Session   Activity Tolerance: Patient tolerated treatment well Patient left: in bed;with call bell/phone within reach;with bed alarm set;with family/visitor present Nurse Communication: Mobility status    Functional Assessment Tool Used: clincal judgement Functional Limitation: Mobility: Walking and  moving around Mobility: Walking and Moving Around Current Status (N2778): 0 percent impaired, limited or restricted Mobility: Walking and Moving Around Goal Status [id number]): 0 percent impaired, limited or restricted Mobility: Walking and Moving Around Discharge Status [phone]): 0 percent impaired, limited or restricted    Time: 3154-0086 PT Time Calculation (min) (ACUTE ONLY): 19 min   Charges:   PT Evaluation $Initial PT Evaluation Tier I: 1 Procedure     PT G Codes:   PT G-Codes **NOT FOR INPATIENT CLASS** Functional Assessment Tool Used: clincal judgement Functional Limitation: Mobility: Walking and moving around Mobility: Walking and Moving Around Current Status (P6195): 0 percent impaired, limited or restricted Mobility: Walking and Moving Around Goal Status (K9326): 0 percent impaired, limited or restricted Mobility: Walking and Moving Around Discharge Status (Z1245): 0 percent impaired, limited or restricted    Duncan Dull 09/29/2014, 3:29 PM Alben Deeds, [place] DPT  [phone]

## 2014-09-30 ENCOUNTER — Observation Stay (HOSPITAL_BASED_OUTPATIENT_CLINIC_OR_DEPARTMENT_OTHER): Payer: BLUE CROSS/BLUE SHIELD

## 2014-09-30 ENCOUNTER — Telehealth: Payer: Self-pay | Admitting: Medical

## 2014-09-30 DIAGNOSIS — G459 Transient cerebral ischemic attack, unspecified: Principal | ICD-10-CM

## 2014-09-30 DIAGNOSIS — G451 Carotid artery syndrome (hemispheric): Secondary | ICD-10-CM | POA: Diagnosis not present

## 2014-09-30 LAB — LIPID PANEL
Cholesterol: 168 mg/dL (ref 0–200)
HDL: 60 mg/dL (ref >40)
LDL Cholesterol: 94 mg/dL (ref 0–99)
Total CHOL/HDL Ratio: 2.8 RATIO
Triglycerides: 69 mg/dL (ref <150)
VLDL: 14 mg/dL (ref 0–40)

## 2014-09-30 LAB — GLUCOSE, CAPILLARY: Glucose-Capillary: 85 mg/dL (ref 65–99)

## 2014-09-30 LAB — BASIC METABOLIC PANEL
Anion gap: 3 — ABNORMAL LOW (ref 5–15)
BUN: 14 mg/dL (ref 6–20)
CO2: 29 mmol/L (ref 22–32)
Calcium: 8.6 mg/dL — ABNORMAL LOW (ref 8.9–10.3)
Chloride: 109 mmol/L (ref 101–111)
Creatinine, Ser: 0.59 mg/dL (ref 0.44–1.00)
GFR calc Af Amer: >60 mL/min (ref >60)
GFR calc non Af Amer: >60 mL/min (ref >60)
Glucose, Bld: 90 mg/dL (ref 65–99)
Potassium: 4 mmol/L (ref 3.5–5.1)
Sodium: 141 mmol/L (ref 135–145)

## 2014-09-30 LAB — HEMOGLOBIN A1C
Hgb A1c MFr Bld: 6 % — ABNORMAL HIGH (ref 4.8–5.6)
Mean Plasma Glucose: 126 mg/dL

## 2014-09-30 MED ORDER — SIMVASTATIN 20 MG PO TABS: 20.0000 mg | ORAL_TABLET | Freq: Every day | ORAL | Status: DC

## 2014-09-30 MED ORDER — UNABLE TO FIND: Status: DC

## 2014-09-30 MED ORDER — ASPIRIN EC 81 MG PO TBEC: 81.0000 mg | DELAYED_RELEASE_TABLET | Freq: Every day | ORAL | Status: DC

## 2014-09-30 NOTE — Progress Notes (Signed)
  Echocardiogram 2D Echocardiogram has been performed.  Darlina Sicilian M 09/30/2014, 10:29 AM

## 2014-09-30 NOTE — Progress Notes (Signed)
Occupational Therapy Discharge Patient Details Name: Lisa Briggs MRN: [id number] DOB: 05-18-1960 Today's Date: 09/30/2014 Time:  -     Patient discharged from OT services secondary to patient declined evaluation and states she is back to baseline. Ot educated on purpose of evaluation and pt continued to decline. Pt observed ambulating in hallway. .  Please see latest therapy progress note for current level of functioning and progress toward goals.    Progress and discharge plan discussed with patient and/or caregiver: Patient/Caregiver agrees with plan  GO     Peri Maris  Pager: [phone]  09/30/2014, 8:14 AM

## 2014-09-30 NOTE — Telephone Encounter (Signed)
Have her return soon for OV.   She was just in hospital for facial numbness. I want to see her for med check f/u in general.  She still needs to c/t with plan to see neurology in f/u.

## 2014-09-30 NOTE — Progress Notes (Signed)
Patient discharged home. Patient escorted to sister's car via wheelchair. Neuro assessment unchanged. Patient denies headache, nausea. RN discussed discharge instructions with patient. Patient vocalized understanding about medications, follow up appointments, and signs or symptoms of stroke. Discharge instructions, prescription, and work note given to patient.

## 2014-09-30 NOTE — Discharge Instructions (Signed)
Transient Ischemic Attack A transient ischemic attack (TIA) is a "warning stroke" that causes stroke-like symptoms. Unlike a stroke, a TIA does not cause permanent damage to the brain. The symptoms of a TIA can happen very fast and do not last long. It is important to know the symptoms of a TIA and what to do. This can help prevent a major stroke or death. CAUSES   A TIA is caused by a temporary blockage in an artery in the brain or neck (carotid artery). The blockage does not allow the brain to get the blood supply it needs and can cause different symptoms. The blockage can be caused by either:  A blood clot.  Fatty buildup (plaque) in a neck or brain artery. RISK FACTORS  High blood pressure (hypertension).  High cholesterol.  Diabetes mellitus.  Heart disease.  The build up of plaque in the blood vessels (peripheral artery disease or atherosclerosis).  The build up of plaque in the blood vessels providing blood and oxygen to the brain (carotid artery stenosis).  An abnormal heart rhythm (atrial fibrillation).  Obesity.  Smoking.  Taking oral contraceptives (especially in combination with smoking).  Physical inactivity.  A diet high in fats, salt (sodium), and calories.  Alcohol use.  Use of illegal drugs (especially cocaine and methamphetamine).  Being female.  Being African American.  Being over the age of 67.  Family history of stroke.  Previous history of blood clots, stroke, TIA, or heart attack.  Sickle cell disease. SYMPTOMS  TIA symptoms are the same as a stroke but are temporary. These symptoms usually develop suddenly, or may be newly present upon awakening from sleep:  Sudden weakness or numbness of the face, arm, or leg, especially on one side of the body.  Sudden trouble walking or difficulty moving arms or legs.  Sudden confusion.  Sudden personality changes.  Trouble speaking (aphasia) or understanding.  Difficulty swallowing.  Sudden  trouble seeing in one or both eyes.  Double vision.  Dizziness.  Loss of balance or coordination.  Sudden severe headache with no known cause.  Trouble reading or writing.  Loss of bowel or bladder control.  Loss of consciousness. DIAGNOSIS  Your caregiver may be able to determine the presence or absence of a TIA based on your symptoms, history, and physical exam. Computed tomography (CT scan) of the brain is usually performed to help identify a TIA. Other tests may be done to diagnose a TIA. These tests may include:  Electrocardiography.  Continuous heart monitoring.  Echocardiography.  Carotid ultrasonography.  Magnetic resonance imaging (MRI).  A scan of the brain circulation.  Blood tests. PREVENTION  The risk of a TIA can be decreased by appropriately treating high blood pressure, high cholesterol, diabetes, heart disease, and obesity and by quitting smoking, limiting alcohol, and staying physically active. TREATMENT  Time is of the essence. Since the symptoms of TIA are the same as a stroke, it is important to seek treatment as soon as possible because you may need a medicine to dissolve the clot (thrombolytic) that cannot be given if too much time has passed. Treatment options vary. Treatment options may include rest, oxygen, intravenous (IV) fluids, and medicines to thin the blood (anticoagulants). Medicines and diet may be used to address diabetes, high blood pressure, and other risk factors. Measures will be taken to prevent short-term and long-term complications, including infection from breathing foreign material into the lungs (aspiration pneumonia), blood clots in the legs, and falls. Treatment options include procedures  to either remove plaque in the carotid arteries or dilate carotid arteries that have narrowed due to plaque. Those procedures are:  Carotid endarterectomy.  Carotid angioplasty and stenting. HOME CARE INSTRUCTIONS   Take all medicines prescribed  by your caregiver. Follow the directions carefully. Medicines may be used to control risk factors for a stroke. Be sure you understand all your medicine instructions.  You may be told to take aspirin or the anticoagulant warfarin. Warfarin needs to be taken exactly as instructed.  Taking too much or too little warfarin is dangerous. Too much warfarin increases the risk of bleeding. Too little warfarin continues to allow the risk for blood clots. While taking warfarin, you will need to have regular blood tests to measure your blood clotting time. A PT blood test measures how long it takes for blood to clot. Your PT is used to calculate another value called an INR. Your PT and INR help your caregiver to adjust your dose of warfarin. The dose can change for many reasons. It is critically important that you take warfarin exactly as prescribed.  Many foods, especially foods high in vitamin K can interfere with warfarin and affect the PT and INR. Foods high in vitamin K include spinach, kale, broccoli, cabbage, collard and turnip greens, brussels sprouts, peas, cauliflower, seaweed, and parsley as well as beef and pork liver, green tea, and soybean oil. You should eat a consistent amount of foods high in vitamin K. Avoid major changes in your diet, or notify your caregiver before changing your diet. Arrange a visit with a dietitian to answer your questions.  Many medicines can interfere with warfarin and affect the PT and INR. You must tell your caregiver about any and all medicines you take, this includes all vitamins and supplements. Be especially cautious with aspirin and anti-inflammatory medicines. Do not take or discontinue any prescribed or over-the-counter medicine except on the advice of your caregiver or pharmacist.  Warfarin can have side effects, such as excessive bruising or bleeding. You will need to hold pressure over cuts for longer than usual. Your caregiver or pharmacist will discuss other  potential side effects.  Avoid sports or activities that may cause injury or bleeding.  Be mindful when shaving, flossing your teeth, or handling sharp objects.  Alcohol can change the body's ability to handle warfarin. It is best to avoid alcoholic drinks or consume only very small amounts while taking warfarin. Notify your caregiver if you change your alcohol intake.  Notify your dentist or other caregivers before procedures.  Eat a diet that includes 5 or more servings of fruits and vegetables each day. This may reduce the risk of stroke. Certain diets may be prescribed to address high blood pressure, high cholesterol, diabetes, or obesity.  A low-sodium, low-saturated fat, low-trans fat, low-cholesterol diet is recommended to manage high blood pressure.  A low-saturated fat, low-trans fat, low-cholesterol, and high-fiber diet may control cholesterol levels.  A controlled-carbohydrate, controlled-sugar diet is recommended to manage diabetes.  A reduced-calorie, low-sodium, low-saturated fat, low-trans fat, low-cholesterol diet is recommended to manage obesity.  Maintain a healthy weight.  Stay physically active. It is recommended that you get at least 30 minutes of activity on most or all days.  Do not smoke.  Limit alcohol use even if you are not taking warfarin. Moderate alcohol use is considered to be:  No more than 2 drinks each day for men.  No more than 1 drink each day for nonpregnant women.  Stop drug abuse.  Home safety. A safe home environment is important to reduce the risk of falls. Your caregiver may arrange for specialists to evaluate your home. Having grab bars in the bedroom and bathroom is often important. Your caregiver may arrange for equipment to be used at home, such as raised toilets and a seat for the shower.  Follow all instructions for follow-up with your caregiver. This is very important. This includes any referrals and lab tests. Proper follow up can  prevent a stroke or another TIA from occurring. SEEK MEDICAL CARE IF:  You have personality changes.  You have difficulty swallowing.  You are seeing double.  You have dizziness.  You have a fever.  You have skin breakdown. SEEK IMMEDIATE MEDICAL CARE IF:  Any of these symptoms may represent a serious problem that is an emergency. Do not wait to see if the symptoms will go away. Get medical help right away. Call your local emergency services (911 in [place] Do not drive yourself to the hospital.  You have sudden weakness or numbness of the face, arm, or leg, especially on one side of the body.  You have sudden trouble walking or difficulty moving arms or legs.  You have sudden confusion.  You have trouble speaking (aphasia) or understanding.  You have sudden trouble seeing in one or both eyes.  You have a loss of balance or coordination.  You have a sudden, severe headache with no known cause.  You have new chest pain or an irregular heartbeat.  You have a partial or total loss of consciousness. MAKE SURE YOU:   Understand these instructions.  Will watch your condition.  Will get help right away if you are not doing well or get worse. Document Released: 12/28/2004 Document Revised: 03/25/2013 Document Reviewed: 06/25/2013 [facility] Patient Information 2015 [place], [place]. This information is not intended to replace advice given to you by your health care provider. Make sure you discuss any questions you have with your health care provider.  STROKE/TIA DISCHARGE INSTRUCTIONS SMOKING Cigarette smoking nearly doubles your risk of having a stroke & is the single most alterable risk factor  If you smoke or have smoked in the last 12 months, you are advised to quit smoking for your health.  Most of the excess cardiovascular risk related to smoking disappears within a year of stopping.  Ask you doctor about anti-smoking medications  [place] Quit Line: 1-800-QUIT NOW  Free  Smoking Cessation Classes [phone]  CHOLESTEROL Know your levels; limit fat & cholesterol in your diet  Lipid Panel     Component Value Date/Time   CHOL 168 09/30/2014 0607   TRIG 69 09/30/2014 0607   HDL 60 09/30/2014 0607   CHOLHDL 2.8 09/30/2014 0607   VLDL 14 09/30/2014 0607   LDLCALC 94 09/30/2014 0607      Many patients benefit from treatment even if their cholesterol is at goal.  Goal: Total Cholesterol (CHOL) less than 160  Goal:  Triglycerides (TRIG) less than 150  Goal:  HDL greater than 40  Goal:  LDL (LDLCALC) less than 100   BLOOD PRESSURE American Stroke Association blood pressure target is less that 120/80 mm/Hg  Your discharge blood pressure is:  BP: 116/84 mmHg  Monitor your blood pressure  Limit your salt and alcohol intake  Many individuals will require more than one medication for high blood pressure  DIABETES (A1c is a blood sugar average for last 3 months) Goal HGBA1c is under 7% (HBGA1c is blood sugar average for  last 3 months)  Diabetes: No known diagnosis of diabetes    Lab Results  Component Value Date   HGBA1C 6.0* 09/29/2014     Your HGBA1c can be lowered with medications, healthy diet, and exercise.  Check your blood sugar as directed by your physician  Call your physician if you experience unexplained or low blood sugars.  PHYSICAL ACTIVITY/REHABILITATION Goal is 30 minutes at least 4 days per week  Activity: Increase activity slowly, Therapies: None   Activity decreases your risk of heart attack and stroke and makes your heart stronger.  It helps control your weight and blood pressure; helps you relax and can improve your mood.  Participate in a regular exercise program.  Talk with your doctor about the best form of exercise for you (dancing, walking, swimming, cycling).  DIET/WEIGHT Goal is to maintain a healthy weight  Your discharge diet is: Diet Heart Room service appropriate?: Yes; Fluid consistency:: Thin Diet - low  sodium heart healthy thin liquids Your height is:  Height: 5\' 9"  (175.3 cm) Your current weight is: Weight: 96.6 kg (212 lb 15.4 oz) Your Body Mass Index (BMI) is:  BMI (Calculated): 31.5  Following the type of diet specifically designed for you will help prevent another stroke.  Your goal Body Mass Index (BMI) is 19-24.  Healthy food habits can help reduce 3 risk factors for stroke:  High cholesterol, hypertension, and excess weight.  RESOURCES Stroke/Support Group:  Call [phone]   STROKE EDUCATION PROVIDED/REVIEWED AND GIVEN TO PATIENT Stroke warning signs and symptoms How to activate emergency medical system (call 911). Medications prescribed at discharge. Need for follow-up after discharge. Personal risk factors for stroke. Pneumonia vaccine given: No Flu vaccine given: No My questions have been answered, the writing is legible, and I understand these instructions.  I will adhere to these goals & educational materials that have been provided to me after my discharge from the hospital.   Aspirin, ASA oral tablets What is this medicine? ASPIRIN (AS pir in) is a pain reliever. It is used to treat mild pain and fever. This medicine is also used as directed by a doctor to prevent and to treat heart attacks, to prevent strokes, and to treat arthritis or inflammation. This medicine may be used for other purposes; ask your health care provider or pharmacist if you have questions. COMMON BRAND NAME(S): Aspir-Low, Aspir-Trin, Aspirtab, Bayer Advanced Aspirin, Bayer Aspirin, Bayer Aspirin Extra Strength, Bayer Aspirin Plus, Nature conservation officer, Nature conservation officer Plus, Bayer Genuine Aspirin, Bayer Womens Aspirin, Bufferin, Bufferin Extra Strength, Bufferin Low Dose What should I tell my health care provider before I take this medicine? They need to know if you have any of these conditions: -anemia -asthma -bleeding problems -child with chickenpox, the flu, or other viral  infection -diabetes -gout -if you frequently drink alcohol containing drinks -kidney disease -liver disease -low level of vitamin K -lupus -smoke tobacco -stomach ulcers or other problems -an unusual or allergic reaction to aspirin, tartrazine dye, other medicines, dyes, or preservatives -pregnant or trying to get pregnant -breast-feeding How should I use this medicine? Take this medicine by mouth with a glass of water. Follow the directions on the package or prescription label. You can take this medicine with or without food. If it upsets your stomach, take it with food. Do not take your medicine more often than directed. Talk to your pediatrician regarding the use of this medicine in children. While this drug may be prescribed for children as young as  55 years of age for selected conditions, precautions do apply. Children and teenagers should not use this medicine to treat chicken pox or flu symptoms unless directed by a doctor. Patients over 24 years old may have a stronger reaction and need a smaller dose. Overdosage: If you think you have taken too much of this medicine contact a poison control center or emergency room at once. NOTE: This medicine is only for you. Do not share this medicine with others. What if I miss a dose? If you are taking this medicine on a regular schedule and miss a dose, take it as soon as you can. If it is almost time for your next dose, take only that dose. Do not take double or extra doses. What may interact with this medicine? Do not take this medicine with any of the following medications: -cidofovir -ketorolac -probenecid This medicine may also interact with the following medications: -alcohol -alendronate -bismuth subsalicylate -flavocoxid -herbal supplements like feverfew, garlic, ginger, ginkgo biloba, horse chestnut -medicines for diabetes or glaucoma like acetazolamide, methazolamide -medicines for gout -medicines that treat or prevent blood  clots like enoxaparin, heparin, ticlopidine, warfarin -other aspirin and aspirin-like medicines -NSAIDs, medicines for pain and inflammation, like ibuprofen or naproxen -pemetrexed -sulfinpyrazone -varicella live vaccine This list may not describe all possible interactions. Give your health care provider a list of all the medicines, herbs, non-prescription drugs, or dietary supplements you use. Also tell them if you smoke, drink alcohol, or use illegal drugs. Some items may interact with your medicine. What should I watch for while using this medicine? If you are treating yourself for pain, tell your doctor or health care professional if the pain lasts more than 10 days, if it gets worse, or if there is a new or different kind of pain. Tell your doctor if you see redness or swelling. Also, check with your doctor if you have a fever that lasts for more than 3 days. Only take this medicine to prevent heart attacks or blood clotting if prescribed by your doctor or health care professional. Do not take aspirin or aspirin-like medicines with this medicine. Too much aspirin can be dangerous. Always read the labels carefully. This medicine can irritate your stomach or cause bleeding problems. Do not smoke cigarettes or drink alcohol while taking this medicine. Do not lie down for 30 minutes after taking this medicine to prevent irritation to your throat. If you are scheduled for any medical or dental procedure, tell your healthcare provider that you are taking this medicine. You may need to stop taking this medicine before the procedure. This medicine may be used to treat migraines. If you take migraine medicines for 10 or more days a month, your migraines may get worse. Keep a diary of headache days and medicine use. Contact your healthcare professional if your migraine attacks occur more frequently. What side effects may I notice from receiving this medicine? Side effects that you should report to your  doctor or health care professional as soon as possible: -allergic reactions like skin rash, itching or hives, swelling of the face, lips, or tongue -breathing problems -changes in hearing, ringing in the ears -confusion -general ill feeling or flu-like symptoms -pain on swallowing -redness, blistering, peeling or loosening of the skin, including inside the mouth or nose -signs and symptoms of bleeding such as bloody or black, tarry stools; red or dark-brown urine; spitting up blood or brown material that looks like coffee grounds; red spots on the skin; unusual bruising or  bleeding from the eye, gums, or nose -trouble passing urine or change in the amount of urine -unusually weak or tired -yellowing of the eyes or skin Side effects that usually do not require medical attention (report to your doctor or health care professional if they continue or are bothersome): -diarrhea or constipation -headache -nausea, vomiting -stomach gas, heartburn This list may not describe all possible side effects. Call your doctor for medical advice about side effects. You may report side effects to FDA at 1-800-FDA-1088. Where should I keep my medicine? Keep out of the reach of children. Store at room temperature between 15 and 30 degrees C (59 and 86 degrees F). Protect from heat and moisture. Do not use this medicine if it has a strong vinegar smell. Throw away any unused medicine after the expiration date. NOTE: This sheet is a summary. It may not cover all possible information. If you have questions about this medicine, talk to your doctor, pharmacist, or health care provider.  2015, Elsevier/Gold Standard. (2012-11-19 11:30:31)  Simvastatin tablets What is this medicine? SIMVASTATIN (SIM va stat in) is known as a HMG-CoA reductase inhibitor or 'statin'. It lowers the level of cholesterol and triglycerides in the blood. This drug may also reduce the risk of heart attack, stroke, or other health problems in  patients with risk factors for heart or blood vessel disease. Diet and lifestyle changes are often used with this drug. This medicine may be used for other purposes; ask your health care provider or pharmacist if you have questions. COMMON BRAND NAME(S): Zocor What should I tell my health care provider before I take this medicine? They need to know if you have any of these conditions: -frequently drink alcoholic beverages -kidney disease -liver disease -muscle aches or weakness -other medical condition -an unusual or allergic reaction to simvastatin, other medicines, foods, dyes, or preservatives -pregnant or trying to get pregnant -breast-feeding How should I use this medicine? Take this medicine by mouth with a glass of water. Follow the directions on the prescription label. You can take this medicine with or without food. Take your doses at regular intervals. Do not take your medicine more often than directed. Talk to your pediatrician regarding the use of this medicine in children. Special care may be needed. Overdosage: If you think you have taken too much of this medicine contact a poison control center or emergency room at once. NOTE: This medicine is only for you. Do not share this medicine with others. What if I miss a dose? If you miss a dose, take it as soon as you can. If it is almost time for your next dose, take only that dose. Do not take double or extra doses. What may interact with this medicine? Do not take this medicine with any of the following medications: -boceprevir -cyclosporine -danazol -gemfibrozil -medicines for fungal infections like itraconazole, ketoconazole, posaconazole, voriconazole -medicines for HIV infection -mifepristone, RU-486 -nefazodone -red yeast rice -some antibiotics like clarithromycin, erythromycin, telithromycin -telaprevir This medicine may also interact with the following  medications: -alcohol -amiodarone -amlodipine -colchicine -digoxin -diltiazem -dronedarone -fluconazole -grapefruit juice -niacin -ranolazine -verapamil -warfarin This list may not describe all possible interactions. Give your health care provider a list of all the medicines, herbs, non-prescription drugs, or dietary supplements you use. Also tell them if you smoke, drink alcohol, or use illegal drugs. Some items may interact with your medicine. What should I watch for while using this medicine? Visit your doctor or health care professional for regular check-ups.  You may need regular tests to make sure your liver is working properly. Tell your doctor or health care professional right away if you get any unexplained muscle pain, tenderness, or weakness, especially if you also have a fever and tiredness. Your doctor or health care professional may tell you to stop taking this medicine if you develop muscle problems. If your muscle problems do not go away after stopping this medicine, contact your health care professional. This medicine may affect blood sugar levels. If you have diabetes, check with your doctor or health care professional before you change your diet or the dose of your diabetic medicine. This drug is only part of a total heart-health program. Your doctor or a dietician can suggest a low-cholesterol and low-fat diet to help. Avoid alcohol and smoking, and keep a proper exercise schedule. Do not use this drug if you are pregnant or breast-feeding. Serious side effects to an unborn child or to an infant are possible. Talk to your doctor or pharmacist for more information. If you are going to have surgery tell your health care professional that you are taking this drug. Some drugs may increase the risk of side effects from simvastatin. If you are given certain antibiotics or antifungals, your doctor or health care professional may stop simvastatin for a short time. Check with your  doctor or pharmacist for advice. What side effects may I notice from receiving this medicine? Side effects that you should report to your doctor or health care professional as soon as possible: -allergic reactions like skin rash, itching or hives, swelling of the face, lips, or tongue -confusion -dark urine -fever -joint pain -loss of memory -muscle cramps, pain -redness, blistering, peeling or loosening of the skin, including inside the mouth -trouble passing urine or change in the amount of urine -unusually weak or tired -yellowing of the eyes or skin Side effects that usually do not require medical attention (report to your doctor or health care professional if they continue or are bothersome): -constipation -heartburn -stomach gas, pain, upset This list may not describe all possible side effects. Call your doctor for medical advice about side effects. You may report side effects to FDA at 1-800-FDA-1088. Where should I keep my medicine? Keep out of the reach of children. Store at room temperature below 30 degrees C (86 degrees F). Keep container tightly closed. Throw away any unused medicine after the expiration date. NOTE: This sheet is a summary. It may not cover all possible information. If you have questions about this medicine, talk to your doctor, pharmacist, or health care provider.  2015, [employer]/Gold Standard. (2011-02-07 10:40:36)

## 2014-09-30 NOTE — Discharge Summary (Signed)
[facility]  Physician Discharge Summary   Patient ID: Lisa Briggs MRN: [id number] DOB/AGE: 07-12-1960 54 y.o.  Admit date: 09/29/2014 Discharge date: 09/30/2014  PCP: Crisoforo Oxford, PA-C  DISCHARGE DIAGNOSES:  Principal Problem:   TIA (transient ischemic attack) Active Problems:   Gastroesophageal reflux disease without esophagitis   Essential hypertension   Hyperlipidemia   RECOMMENDATIONS FOR OUTPATIENT FOLLOW UP: 1. Close follow-up with PCP 2. Follow-up with neurology in 2-3 weeks. Referral has been sent via Epic.   DISCHARGE CONDITION: fair  Diet recommendation: Heart healthy  Filed Weights   09/29/14 0419 09/29/14 1945  Weight: 93.923 kg (207 lb 1 oz) 96.6 kg (212 lb 15.4 oz)    INITIAL HISTORY: 54 year old African-American female with a past medical history of hypertension, hyperlipidemia, GERD, presented with facial numbness and tingling of the left arm with heaviness. Symptoms had resolved by the time she presented to the emergency department. She was admitted for further evaluation.  Consultations:  Neurology  Procedures: 2-D echocardiogram Study Conclusions - Left ventricle: The cavity size was normal. Systolic function wasnormal. The estimated ejection fraction was in the range of 60%to 65%. Wall motion was normal; there were no regional wallmotion abnormalities. Left ventricular diastolic functionparameters were normal. - Aortic valve: Trileaflet; normal thickness leaflets. There was noregurgitation. - Aortic root: The aortic root was normal in size. - Mitral valve: Structurally normal valve. There was mildregurgitation. - Left atrium: The atrium was normal in size. - Right ventricle: The cavity size was normal. Wall thickness wasnormal. Systolic function was normal. - Right atrium: The atrium was normal in size. - Tricuspid valve: There was moderate regurgitation. - Pulmonic valve: There was no regurgitation. - Pulmonary  arteries: Systolic pressure was within the normalrange. - Inferior vena cava: The vessel was normal in size. - Pericardium, extracardiac: A trivial pericardial effusion wasidentified posterior to the heart. Impressions: - Moderate TR, mild MR, otherwise normal study  Carotid Doppler Findings suggest 1-39% internal carotid artery stenosis bilaterally. Vertebral arteries are patent with antegrade flow.  HOSPITAL COURSE:   TIA Patient was admitted to the hospital for further evaluation. She underwent MRI of range, did not show any stroke. Underwent echocardiogram and carotid Dopplers as discussed above. She was seen by neurology who recommends aspirin 81 mg daily. LDL 94. HbA1c 6.0. Her symptoms had resolved by the time she was admitted to the hospital. MRI brain did show nonspecific white matter changes. Discussed with neurology. They recommend outpatient follow-up in 2-3 weeks. Patient was also seen by physical and occupational therapy, and there are no needs at this time.  Hypokalemia This was aggressively repleted. Potassium is normal.  History of essential hypertension Stable. Continue with her medications.  Hyperlipidemia Patient will benefit from better control of LDL. Will initiate statin.  History of GERD Continue home medications  Overall, patient remains stable. Cleared for discharge today.   PERTINENT LABS:  The results of significant diagnostics from this hospitalization (including imaging, microbiology, ancillary and laboratory) are listed below for reference.     Labs: Basic Metabolic Panel:  Recent Labs Lab 09/29/14 0500 09/29/14 0749 09/30/14 1045  NA 137 140 141  K 2.9* 3.3* 4.0  CL 102 100* 109  CO2 27  --  29  GLUCOSE 92 94 90  BUN 17 18 14   CREATININE 0.66 0.60 0.59  CALCIUM 9.3  --  8.6*   Liver Function Tests:  Recent Labs Lab 09/29/14 0500  AST 21  ALT 16  ALKPHOS 122  BILITOT 0.4  PROT 7.2  ALBUMIN 4.0   CBC:  Recent Labs Lab  09/29/14 0500 09/29/14 0738 09/29/14 0749  WBC 9.2  --   --   NEUTROABS 5.4 4.6  --   HGB 12.2  --  13.6  HCT 38.1  --  40.0  MCV 85.4  --   --   PLT 328  --   --    CBG:  Recent Labs Lab 09/29/14 1638 09/29/14 2144 09/30/14 0645  GLUCAP 82 121* 85     IMAGING STUDIES Ct Head Wo Contrast  09/29/2014   CLINICAL DATA:  Intermittent facial tingling and finger numbness.  EXAM: CT HEAD WITHOUT CONTRAST  TECHNIQUE: Contiguous axial images were obtained from the base of the skull through the vertex without intravenous contrast.  COMPARISON:  None.  FINDINGS: No mass effect or midline shift. No evidence of acute intracranial hemorrhage, or infarction. No abnormal extra-axial fluid collections. Gray-white matter differentiation is normal. Basal cisterns are preserved.  No depressed skull fractures. Visualized paranasal sinuses and mastoid air cells are not opacified.  IMPRESSION: No CT evidence of acute intracranial abnormality.   Electronically Signed   By: Fidela Salisbury M.D.   On: 09/29/2014 08:55   Mri Brain Without Contrast  09/29/2014   CLINICAL DATA:  Episode of numbness and tingling in the face and left hand and foot. Left arm heaviness. Symptoms lasted 1 hr and have since resolved.  EXAM: MRI HEAD WITHOUT CONTRAST  MRA HEAD WITHOUT CONTRAST  TECHNIQUE: Multiplanar, multiecho pulse sequences of the brain and surrounding structures were obtained without intravenous contrast. Angiographic images of the head were obtained using MRA technique without contrast.  COMPARISON:  None.  FINDINGS: MRI HEAD FINDINGS  The diffusion-weighted images demonstrate no evidence for acute or subacute infarction. No hemorrhage or mass lesion is present. The ventricles are of normal size. Scattered subcortical T2 hyperintensities are slightly greater than expected for age. No significant extraaxial fluid collection is present.  Flow is present in the major intracranial arteries. The globes and orbits are  intact. The paranasal sinuses and mastoid air cells are clear.  The skullbase is within normal limits. Midline structures are normal.  MRA HEAD FINDINGS  Internal carotid arteries are within normal limits from the high cervical segments through the ICA termini bilaterally. And A1 and M1 segments are normal. The anterior communicating artery is patent. The ACA and MCA branch vessels are within normal limits.  The left vertebral artery is slightly dominant to the right. The PICA origins are visualized and normal. The basilar artery is within normal limits. Both posterior cerebral arteries originate from the basilar tip. The PCA branch vessels are normal.  IMPRESSION: 1. Scattered periventricular and subcortical T2 hyperintensities are slightly greater than expected for age. The finding is nonspecific but can be seen in the setting of chronic microvascular ischemia, a demyelinating process such as multiple sclerosis, vasculitis, complicated migraine headaches, or as the sequelae of a prior infectious or inflammatory process. 2. No acute abnormality.  No evidence for acute or subacute infarct. 3. Normal variant MRA circle of Willis without evidence for significant proximal stenosis, aneurysm, or branch vessel occlusion.   Electronically Signed   By: San Morelle M.D.   On: 09/29/2014 14:13   Mr Jodene Nam Head/brain Wo Cm  09/29/2014   CLINICAL DATA:  Episode of numbness and tingling in the face and left hand and foot. Left arm heaviness. Symptoms lasted 1 hr and have since resolved.  EXAM: MRI HEAD WITHOUT CONTRAST  MRA HEAD WITHOUT CONTRAST  TECHNIQUE: Multiplanar, multiecho pulse sequences of the brain and surrounding structures were obtained without intravenous contrast. Angiographic images of the head were obtained using MRA technique without contrast.  COMPARISON:  None.  FINDINGS: MRI HEAD FINDINGS  The diffusion-weighted images demonstrate no evidence for acute or subacute infarction. No hemorrhage or mass  lesion is present. The ventricles are of normal size. Scattered subcortical T2 hyperintensities are slightly greater than expected for age. No significant extraaxial fluid collection is present.  Flow is present in the major intracranial arteries. The globes and orbits are intact. The paranasal sinuses and mastoid air cells are clear.  The skullbase is within normal limits. Midline structures are normal.  MRA HEAD FINDINGS  Internal carotid arteries are within normal limits from the high cervical segments through the ICA termini bilaterally. And A1 and M1 segments are normal. The anterior communicating artery is patent. The ACA and MCA branch vessels are within normal limits.  The left vertebral artery is slightly dominant to the right. The PICA origins are visualized and normal. The basilar artery is within normal limits. Both posterior cerebral arteries originate from the basilar tip. The PCA branch vessels are normal.  IMPRESSION: 1. Scattered periventricular and subcortical T2 hyperintensities are slightly greater than expected for age. The finding is nonspecific but can be seen in the setting of chronic microvascular ischemia, a demyelinating process such as multiple sclerosis, vasculitis, complicated migraine headaches, or as the sequelae of a prior infectious or inflammatory process. 2. No acute abnormality.  No evidence for acute or subacute infarct. 3. Normal variant MRA circle of Willis without evidence for significant proximal stenosis, aneurysm, or branch vessel occlusion.   Electronically Signed   By: San Morelle M.D.   On: 09/29/2014 14:13    DISCHARGE EXAMINATION: Filed Vitals:   09/30/14 0159 09/30/14 0655 09/30/14 1054 09/30/14 1400  BP: 100/62 118/61 126/83 116/84  Pulse: 61 63 61 74  Temp: 98 F (36.7 C) 97.8 F (36.6 C) 97.6 F (36.4 C) 98.2 F (36.8 C)  TempSrc: Oral Oral Oral Oral  Resp: 18 18 16 18   Height:      Weight:      SpO2: 99% 100% 100% 100%   General  appearance: alert, cooperative and no distress Resp: clear to auscultation bilaterally Cardio: regular rate and rhythm, S1, S2 normal, no murmur, click, rub or gallop GI: soft, non-tender; bowel sounds normal; no masses,  no organomegaly Neurologic: Alert and oriented X 3, normal strength and tone. Normal symmetric reflexes. Normal coordination and gait  DISPOSITION: Home  Discharge Instructions    Ambulatory referral to Neurology    Complete by:  As directed   In 2-3 weeks for findings noted on MRI.     Call MD for:  difficulty breathing, headache or visual disturbances    Complete by:  As directed      Call MD for:  extreme fatigue    Complete by:  As directed      Call MD for:  persistant dizziness or light-headedness    Complete by:  As directed      Call MD for:  persistant nausea and vomiting    Complete by:  As directed      Diet - low sodium heart healthy    Complete by:  As directed      Discharge instructions    Complete by:  As directed   Please be sure to follow up with your PCP. A referral  has been made to neurology. If you don't hear from them, please call your PCP for further instructions.  You were cared for by a hospitalist during your hospital stay. If you have any questions about your discharge medications or the care you received while you were in the hospital after you are discharged, you can call the unit and asked to speak with the hospitalist on call if the hospitalist that took care of you is not available. Once you are discharged, your primary care physician will handle any further medical issues. Please note that NO REFILLS for any discharge medications will be authorized once you are discharged, as it is imperative that you return to your primary care physician (or establish a relationship with a primary care physician if you do not have one) for your aftercare needs so that they can reassess your need for medications and monitor your lab values. If you do not  have a primary care physician, you can call [phone] for a physician referral.     Increase activity slowly    Complete by:  As directed            ALLERGIES: No Known Allergies   Current Discharge Medication List    START taking these medications   Details  aspirin EC 81 MG tablet Take 1 tablet (81 mg total) by mouth daily. Qty: 30 tablet, Refills: 3    simvastatin (ZOCOR) 20 MG tablet Take 1 tablet (20 mg total) by mouth daily. Qty: 30 tablet, Refills: 0      CONTINUE these medications which have NOT CHANGED   Details  cyclobenzaprine (FLEXERIL) 10 MG tablet Take 1 tablet (10 mg total) by mouth 2 (two) times daily as needed for muscle spasms. Qty: 30 tablet, Refills: 0    dexlansoprazole (DEXILANT) 60 MG capsule Take 1 capsule (60 mg total) by mouth daily. Qty: 90 capsule, Refills: 1   Associated Diagnoses: Chronic nausea; Gastritis    famotidine (PEPCID) 20 MG tablet Take 1 tablet (20 mg total) by mouth at bedtime. Qty: 90 tablet, Refills: 1    ferrous fumarate (HEMOCYTE) 325 (106 FE) MG TABS tablet Take 1 tablet (106 mg of iron total) by mouth 2 (two) times daily. Qty: 60 tablet, Refills: 2    fluticasone (FLONASE) 50 MCG/ACT nasal spray Place 1 spray into both nostrils daily as needed for allergies or rhinitis. Qty: 16 g, Refills: 11    loratadine (CLARITIN) 10 MG tablet Take 1 tablet (10 mg total) by mouth daily. Qty: 30 tablet, Refills: 0    losartan-hydrochlorothiazide (HYZAAR) 50-12.5 MG per tablet Take 1 tablet by mouth daily. Qty: 90 tablet, Refills: 3    meloxicam (MOBIC) 15 MG tablet TAKE ONE TABLET BY MOUTH  DAILY AS NEEDED FOR PAIN Qty: 30 tablet, Refills: 0    ondansetron (ZOFRAN) 4 MG tablet Take 1 tablet (4 mg total) by mouth every 8 (eight) hours as needed for nausea or vomiting. Qty: 20 tablet, Refills: 2   Associated Diagnoses: Chronic nausea    polyethylene glycol powder (GLYCOLAX/MIRALAX) powder DISSOLVE ONE CAPFUL IN 8 OUNCES OF WATER AND  TAKE DAILY Qty: 255 g, Refills: 0    traMADol (ULTRAM) 50 MG tablet Take 50 mg by mouth every 6 (six) hours as needed for moderate pain.     Vitamin D, Ergocalciferol, (DRISDOL) 50000 UNITS CAPS capsule Take 1 capsule (50,000 Units total) by mouth every 7 (seven) days. Qty: 4.5 capsule, Refills: 3       Follow-up Information  Follow up with Crisoforo Oxford, PA-C. Schedule an appointment as soon as possible for a visit in 1 week.   Specialty:  Family Medicine   Why:  post hospitalization follow up   Contact information:   [place]  [phone]       TOTAL DISCHARGE TIME: 54 mins  [facility] Pager [phone]  09/30/2014, 4:42 PM

## 2014-09-30 NOTE — Consult Note (Signed)
NEURO HOSPITALIST CONSULT NOTE    Reason for Consult: MRI findings  HPI:                                                                                                                                          Lisa Briggs is an 54 y.o. female with hypertension, hyperlipidemia, GERD presented to ED with complaints of facial numbness and tingling with left arm heaviness since 1 AM on Tuesday. 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 All symptoms have fully resolved. Patient states she has had HA's in the past but not recently. She takes a 81 mg ASA daily. Currently she feels back to her baseline.   LSN 09/29/2014 at 0130 No tPA given as out of window and resolved symptoms.   Past Medical History  Diagnosis Date  . Hypertension   . GERD (gastroesophageal reflux disease)   . Allergy   . Farsightedness     wears glasses, [facility]  . History of uterine fibroid   . Chronic back pain   . Polyarthralgia     normal rheumatoid screen 01/2012  . Constipation   . Chest pain 04/05/2011    cardiac eval, normal treadmill stress test, Dr. Tollie Eth  . Anemia     iron therapy for years as of 10/12; normal Hgb 08/2013  . Hyperlipidemia   . Arthritis     Past Surgical History  Procedure Laterality Date  . Lipoma excision      forehead  . Knee arthroscopy Left   . Uterine fibroid surgery    . Gall stone surgery    . Colonoscopy  01/2014    diverticulosis, othwerise normal - Dr. Owens Loffler  . Esophagogastroduodenoscopy  2013    Dr. Benson Norway, gastritis  . Esophagogastroduodenoscopy  [id number]  . Eus N/A 04/16/2014    Procedure: UPPER ENDOSCOPIC ULTRASOUND (EUS) LINEAR;  Surgeon: Milus Banister, MD;  Location: [facility];  Service: Endoscopy;  Laterality: N/A;  . Laparoscopic gastric resection N/A 06/09/2014     Procedure: LAPAROSCOPIC GASTRIC MASS RESECTION;  Surgeon: [name], MD;  Location: [facility];  Service: General;  Laterality: N/A;    Family History  Problem Relation Age of Onset  . Hypertension Mother   . Arthritis Mother   . GER disease Mother   . Glaucoma Mother   . Stroke Father   . Alcohol abuse Father   . Breast cancer Sister     breast cancer dx late 43s; another sister with leukemia  . Hypertension Sister   . Heart disease Sister     heart murmur; other  sisters with heart problems  . Diabetes Brother   . Diabetes Maternal Aunt   . Heart disease Maternal Aunt   . Heart disease Maternal Grandmother   . Heart disease Maternal Grandfather   . Heart disease Maternal Uncle   . Stroke Maternal Uncle   . Colon cancer Brother 65  . Colon polyps Neg Hx      Social History:  reports that she has never smoked. She has never used smokeless tobacco. She reports that she does not drink alcohol or use illicit drugs.  No Known Allergies  MEDICATIONS:                                                                                                                     Prior to Admission:  Prescriptions prior to admission  Medication Sig Dispense Refill Last Dose  . cyclobenzaprine (FLEXERIL) 10 MG tablet Take 1 tablet (10 mg total) by mouth 2 (two) times daily as needed for muscle spasms. 30 tablet 0 09/25/2014  . dexlansoprazole (DEXILANT) 60 MG capsule Take 1 capsule (60 mg total) by mouth daily. 90 capsule 1 09/28/2014 at Unknown time  . famotidine (PEPCID) 20 MG tablet Take 1 tablet (20 mg total) by mouth at bedtime. 90 tablet 1 09/28/2014 at Unknown time  . ferrous fumarate (HEMOCYTE) 325 (106 FE) MG TABS tablet Take 1 tablet (106 mg of iron total) by mouth 2 (two) times daily. (Patient taking differently: Take 1 tablet by mouth daily. ) 60 tablet 2 09/28/2014 at Unknown time  . fluticasone (FLONASE) 50 MCG/ACT nasal spray Place 1 spray into both nostrils daily as needed for  allergies or rhinitis. 16 g 11 09/25/2014  . loratadine (CLARITIN) 10 MG tablet Take 1 tablet (10 mg total) by mouth daily. 30 tablet 0 09/28/2014 at Unknown time  . losartan-hydrochlorothiazide (HYZAAR) 50-12.5 MG per tablet Take 1 tablet by mouth daily. 90 tablet 3 09/28/2014 at Unknown time  . meloxicam (MOBIC) 15 MG tablet TAKE ONE TABLET BY MOUTH  DAILY AS NEEDED FOR PAIN 30 tablet 0 couple months  . ondansetron (ZOFRAN) 4 MG tablet Take 1 tablet (4 mg total) by mouth every 8 (eight) hours as needed for nausea or vomiting. 20 tablet 2 Past Month at Unknown time  . polyethylene glycol powder (GLYCOLAX/MIRALAX) powder DISSOLVE ONE CAPFUL IN 8 OUNCES OF WATER AND TAKE DAILY 255 g 0 09/28/2014 at Unknown time  . traMADol (ULTRAM) 50 MG tablet Take 50 mg by mouth every 6 (six) hours as needed for moderate pain.    couple months  . Vitamin D, Ergocalciferol, (DRISDOL) 50000 UNITS CAPS capsule Take 1 capsule (50,000 Units total) by mouth every 7 (seven) days. 4.5 capsule 3 09/20/2014   Scheduled: . aspirin  325 mg Oral Daily  . famotidine  20 mg Oral QHS  . ferrous fumarate  1 tablet Oral Daily  . heparin  5,000 Units Subcutaneous 3 times per day  . loratadine  10 mg Oral Daily  . meloxicam  7.5 mg Oral Daily  . pantoprazole  40 mg Oral Daily     ROS:                                                                                                                                       History obtained from the patient  General ROS: negative for - chills, fatigue, fever, night sweats, weight gain or weight loss Psychological ROS: negative for - behavioral disorder, hallucinations, memory difficulties, mood swings or suicidal ideation Ophthalmic ROS: negative for - blurry vision, double vision, eye pain or loss of vision ENT ROS: negative for - epistaxis, nasal discharge, oral lesions, sore throat, tinnitus or vertigo Allergy and Immunology ROS: negative for - hives or itchy/watery  eyes Hematological and Lymphatic ROS: negative for - bleeding problems, bruising or swollen lymph nodes Endocrine ROS: negative for - galactorrhea, hair pattern changes, polydipsia/polyuria or temperature intolerance Respiratory ROS: negative for - cough, hemoptysis, shortness of breath or wheezing Cardiovascular ROS: negative for - chest pain, dyspnea on exertion, edema or irregular heartbeat Gastrointestinal ROS: negative for - abdominal pain, diarrhea, hematemesis, nausea/vomiting or stool incontinence Genito-Urinary ROS: negative for - dysuria, hematuria, incontinence or urinary frequency/urgency Musculoskeletal ROS: negative for - joint swelling or muscular weakness Neurological ROS: as noted in HPI Dermatological ROS: negative for rash and skin lesion changes   Blood pressure 126/83, pulse 61, temperature 97.6 F (36.4 C), temperature source Oral, resp. rate 16, height 5\' 9"  (1.753 m), weight 96.6 kg (212 lb 15.4 oz), last menstrual period 06/01/2013, SpO2 100 %.   Neurologic Examination:                                                                                                      HEENT-  Normocephalic, no lesions, without obvious abnormality.  Normal external eye and conjunctiva.  Normal TM's bilaterally.  Normal auditory canals and external ears. Normal external nose, mucus membranes and septum.  Normal pharynx. Cardiovascular- S1, S2 normal, pulses palpable throughout   Lungs- chest clear, no wheezing, rales, normal symmetric air entry Abdomen- normal findings: bowel sounds normal Extremities- no edema Lymph-no adenopathy palpable Musculoskeletal-no joint tenderness, deformity or swelling Skin-warm and dry, no hyperpigmentation, vitiligo, or suspicious lesions  Neurological Examination Mental Status: Alert, oriented, thought content appropriate.  Speech fluent without evidence of aphasia.  Able to follow 3 step commands without difficulty. Cranial Nerves: II: Discs flat  bilaterally; Visual fields grossly normal, pupils equal, round, reactive to light and accommodation III,IV,  VI: ptosis not present, extra-ocular motions intact bilaterally V,VII: smile symmetric, facial light touch sensation normal bilaterally VIII: hearing normal bilaterally IX,X: uvula rises symmetrically XI: bilateral shoulder shrug XII: midline tongue extension Motor: Right : Upper extremity   5/5    Left:     Upper extremity   5/5  Lower extremity   5/5     Lower extremity   5/5 Tone and bulk:normal tone throughout; no atrophy noted Sensory: Pinprick and light touch intact throughout, bilaterally Deep Tendon Reflexes: 1+ and symmetric throughout Plantars: Right: downgoing   Left: downgoing Cerebellar: normal finger-to-nose, normal rapid alternating movements and normal heel-to-shin test Gait: normal gait and station      Lab Results: Basic Metabolic Panel:  Recent Labs Lab 09/29/14 0500 09/29/14 0749 09/30/14 1045  NA 137 140 141  K 2.9* 3.3* 4.0  CL 102 100* 109  CO2 27  --  29  GLUCOSE 92 94 90  BUN 17 18 14   CREATININE 0.66 0.60 0.59  CALCIUM 9.3  --  8.6*    Liver Function Tests:  Recent Labs Lab 09/29/14 0500  AST 21  ALT 16  ALKPHOS 122  BILITOT 0.4  PROT 7.2  ALBUMIN 4.0   No results for input(s): LIPASE, AMYLASE in the last 168 hours. No results for input(s): AMMONIA in the last 168 hours.  CBC:  Recent Labs Lab 09/29/14 0500 09/29/14 0738 09/29/14 0749  WBC 9.2  --   --   NEUTROABS 5.4 4.6  --   HGB 12.2  --  13.6  HCT 38.1  --  40.0  MCV 85.4  --   --   PLT 328  --   --     Cardiac Enzymes: No results for input(s): CKTOTAL, CKMB, CKMBINDEX, TROPONINI in the last 168 hours.  Lipid Panel:  Recent Labs Lab 09/30/14 0607  CHOL 168  TRIG 69  HDL 60  CHOLHDL 2.8  VLDL 14  LDLCALC 94    CBG:  Recent Labs Lab 09/29/14 1638 09/29/14 2144 09/30/14 0645  GLUCAP 82 121* 85    Microbiology: No results found for this or  any previous visit.  Coagulation Studies:  Recent Labs  09/29/14 0738  LABPROT 13.9  INR 1.05    Imaging: Ct Head Wo Contrast  09/29/2014   CLINICAL DATA:  Intermittent facial tingling and finger numbness.  EXAM: CT HEAD WITHOUT CONTRAST  TECHNIQUE: Contiguous axial images were obtained from the base of the skull through the vertex without intravenous contrast.  COMPARISON:  None.  FINDINGS: No mass effect or midline shift. No evidence of acute intracranial hemorrhage, or infarction. No abnormal extra-axial fluid collections. Gray-white matter differentiation is normal. Basal cisterns are preserved.  No depressed skull fractures. Visualized paranasal sinuses and mastoid air cells are not opacified.  IMPRESSION: No CT evidence of acute intracranial abnormality.   Electronically Signed   By: Fidela Salisbury M.D.   On: 09/29/2014 08:55   Mri Brain Without Contrast  09/29/2014   CLINICAL DATA:  Episode of numbness and tingling in the face and left hand and foot. Left arm heaviness. Symptoms lasted 1 hr and have since resolved.  EXAM: MRI HEAD WITHOUT CONTRAST  MRA HEAD WITHOUT CONTRAST  TECHNIQUE: Multiplanar, multiecho pulse sequences of the brain and surrounding structures were obtained without intravenous contrast. Angiographic images of the head were obtained using MRA technique without contrast.  COMPARISON:  None.  FINDINGS: MRI HEAD FINDINGS  The diffusion-weighted images demonstrate no evidence for acute  or subacute infarction. No hemorrhage or mass lesion is present. The ventricles are of normal size. Scattered subcortical T2 hyperintensities are slightly greater than expected for age. No significant extraaxial fluid collection is present.  Flow is present in the major intracranial arteries. The globes and orbits are intact. The paranasal sinuses and mastoid air cells are clear.  The skullbase is within normal limits. Midline structures are normal.  MRA HEAD FINDINGS  Internal carotid  arteries are within normal limits from the high cervical segments through the ICA termini bilaterally. And A1 and M1 segments are normal. The anterior communicating artery is patent. The ACA and MCA branch vessels are within normal limits.  The left vertebral artery is slightly dominant to the right. The PICA origins are visualized and normal. The basilar artery is within normal limits. Both posterior cerebral arteries originate from the basilar tip. The PCA branch vessels are normal.  IMPRESSION: 1. Scattered periventricular and subcortical T2 hyperintensities are slightly greater than expected for age. The finding is nonspecific but can be seen in the setting of chronic microvascular ischemia, a demyelinating process such as multiple sclerosis, vasculitis, complicated migraine headaches, or as the sequelae of a prior infectious or inflammatory process. 2. No acute abnormality.  No evidence for acute or subacute infarct. 3. Normal variant MRA circle of Willis without evidence for significant proximal stenosis, aneurysm, or branch vessel occlusion.   Electronically Signed   By: San Morelle M.D.   On: 09/29/2014 14:13   Mr Jodene Nam Head/brain Wo Cm  09/29/2014   CLINICAL DATA:  Episode of numbness and tingling in the face and left hand and foot. Left arm heaviness. Symptoms lasted 1 hr and have since resolved.  EXAM: MRI HEAD WITHOUT CONTRAST  MRA HEAD WITHOUT CONTRAST  TECHNIQUE: Multiplanar, multiecho pulse sequences of the brain and surrounding structures were obtained without intravenous contrast. Angiographic images of the head were obtained using MRA technique without contrast.  COMPARISON:  None.  FINDINGS: MRI HEAD FINDINGS  The diffusion-weighted images demonstrate no evidence for acute or subacute infarction. No hemorrhage or mass lesion is present. The ventricles are of normal size. Scattered subcortical T2 hyperintensities are slightly greater than expected for age. No significant extraaxial fluid  collection is present.  Flow is present in the major intracranial arteries. The globes and orbits are intact. The paranasal sinuses and mastoid air cells are clear.  The skullbase is within normal limits. Midline structures are normal.  MRA HEAD FINDINGS  Internal carotid arteries are within normal limits from the high cervical segments through the ICA termini bilaterally. And A1 and M1 segments are normal. The anterior communicating artery is patent. The ACA and MCA branch vessels are within normal limits.  The left vertebral artery is slightly dominant to the right. The PICA origins are visualized and normal. The basilar artery is within normal limits. Both posterior cerebral arteries originate from the basilar tip. The PCA branch vessels are normal.  IMPRESSION: 1. Scattered periventricular and subcortical T2 hyperintensities are slightly greater than expected for age. The finding is nonspecific but can be seen in the setting of chronic microvascular ischemia, a demyelinating process such as multiple sclerosis, vasculitis, complicated migraine headaches, or as the sequelae of a prior infectious or inflammatory process. 2. No acute abnormality.  No evidence for acute or subacute infarct. 3. Normal variant MRA circle of Willis without evidence for significant proximal stenosis, aneurysm, or branch vessel occlusion.   Electronically Signed   By: Wynetta Fines.D.  On: 09/29/2014 14:13       Assessment and plan per attending neurologist  Etta Quill PA-C [facility] [phone]  09/30/2014, 12:52 PM   Assessment/Plan: 54 YO female with transient bilateral facial numbness.  MRI revealed no acute stroke and chronic white matter changes.  The distribution and characteristic of changes does not look to be MS or demyelinating. At this time would finish TIA work up and have follow up with neurology in 2 -3 weeks. No further in hospital recommendations. Continue daily 81mg  ASA. Neurology  S/O.  Jim Like, DO [facility] [phone]  If 7pm- 7am, please page neurology on call as listed in [place].

## 2014-10-06 ENCOUNTER — Ambulatory Visit (INDEPENDENT_AMBULATORY_CARE_PROVIDER_SITE_OTHER): Payer: BLUE CROSS/BLUE SHIELD | Admitting: Medical

## 2014-10-06 ENCOUNTER — Encounter: Payer: Self-pay | Admitting: Medical

## 2014-10-06 VITALS — BP 102/70 | HR 80 | Temp 97.4°F | Resp 15 | Wt 205.0 lb

## 2014-10-06 DIAGNOSIS — K5900 Constipation, unspecified: Secondary | ICD-10-CM

## 2014-10-06 DIAGNOSIS — R29898 Other symptoms and signs involving the musculoskeletal system: Secondary | ICD-10-CM

## 2014-10-06 DIAGNOSIS — R5383 Other fatigue: Secondary | ICD-10-CM | POA: Diagnosis not present

## 2014-10-06 DIAGNOSIS — G459 Transient cerebral ischemic attack, unspecified: Secondary | ICD-10-CM | POA: Diagnosis not present

## 2014-10-06 DIAGNOSIS — M5441 Lumbago with sciatica, right side: Secondary | ICD-10-CM

## 2014-10-06 DIAGNOSIS — K5909 Other constipation: Secondary | ICD-10-CM

## 2014-10-06 MED ORDER — POLYETHYLENE GLYCOL 3350 17 GM/SCOOP PO POWD
1.0000 | Freq: Every day | ORAL | Status: DC
Start: 2014-10-06 — End: 2014-11-17

## 2014-10-06 MED ORDER — TRAMADOL HCL 50 MG PO TABS: 50.0000 mg | ORAL_TABLET | Freq: Four times a day (QID) | ORAL | Status: DC | PRN

## 2014-10-06 MED ORDER — POTASSIUM CHLORIDE CRYS ER 10 MEQ PO TBCR: 10.0000 meq | EXTENDED_RELEASE_TABLET | Freq: Once | ORAL | Status: DC

## 2014-10-06 NOTE — Telephone Encounter (Signed)
I fax over the patients information to [facility] fax # [phone] for PT on back and leg pain. The referral to the neurologist is in [facility]

## 2014-10-06 NOTE — Progress Notes (Signed)
Subjective: Here for hospital f/u, transition of care visit  Was admitted 09/29/14 - 09/30/14 for TIA symptoms, hypertension, left sided weakness and facial numbness and tingling, fatigue.   Since hospitalization doing ok.   Still feels fatigue, but denies ongoing facial numbness, arm weakness, left side weakness.  Has started statin, compliant with BP medication, not sure about neurology f/u no prior cardiology testing.    She does note ongoing low back pain with radiation down right leg, worse the last few weeks.  No incontinence, no saddles anesthesia.  She does note hx/o bulging disc in back.  Has seen PT in the past.     She wants handicap plaque today given her current limitations with weakness, having to stop frequently.  Will bring FMLA paperwork back.  She notes something got messed up with refill on miralax, needs new corrected script, takes this daily for constipation.  Past Medical History  Diagnosis Date  . Hypertension   . GERD (gastroesophageal reflux disease)   . Allergy   . Farsightedness     wears glasses, [facility]  . History of uterine fibroid   . Chronic back pain   . Polyarthralgia     normal rheumatoid screen 01/2012  . Constipation   . Chest pain 04/05/2011    cardiac eval, normal treadmill stress test, Dr. Tollie Eth  . Anemia     iron therapy for years as of 10/12; normal Hgb 08/2013  . Hyperlipidemia   . Arthritis    ROS as in subjective  Objective: BP 102/70 mmHg  Pulse 80  Temp(Src) 97.4 F (36.3 C) (Oral)  Resp 15  Wt 205 lb (92.987 kg)  LMP 06/01/2013  General appearance: alert, no distress, WD/WN HEENT: normocephalic, sclerae anicteric, PERRLA, EOMi, nares patent, no discharge or erythema, pharynx normal Oral cavity: MMM, no lesions Neck: supple, no lymphadenopathy, no thyromegaly, no masses, no bruits Heart: RRR, normal S1, S2, no murmurs Lungs: CTA bilaterally, no wheezes, rhonchi, or rales Abdomen: +bs, soft, non tender, non  distended, no masses, no hepatomegaly, no splenomegaly Back: tender lumbar paraspinal and midline, R>L, pain with ROM, which is limited due to pain Musculoskeletal: legs nontender, no swelling, no obvious deformity Extremities: no edema, no cyanosis, no clubbing Pulses: 2+ symmetric, upper and lower extremities, normal cap refill Neurological: alert, oriented x 3, but seems to be slower to process words than prior, CN2-12 intact, strength normal upper extremities and lower extremities, sensation normal throughout, DTRs 2+ throughout, no cerebellar signs, gait normal Psychiatric: normal affect, slow to process words compared to usual, behavior normal otherwise, pleasant    Assessment: Encounter Diagnoses  Name Primary?  . Transient cerebral ischemia, unspecified transient cerebral ischemia type Yes  . Left arm weakness   . Other fatigue   . Bilateral low back pain with right-sided sciatica   . Chronic constipation     Plan: Fatigue,TIA, numbness, fatigue - reviewed hospitalization notes, of note no obvious stroke on MRI.   Improved somewhat on fatigue, but facial numbness and arm weakness has resolved.   Referral to neurology, good to hear some improvement.   Discussed symptoms and signs of stroke, limitations after a stroke and appropriate f/u.   C/t with goals of normal BP, LDL <100, daily aspirin, healthy diet.    Low back with radicular pain - Ultram prn, refer to physical therapy.    chronic constipation - refilled medication, miralax, c/t with ongoing health diet with recommended water and fiber intake  Handicap  placard form completed.

## 2014-10-06 NOTE — Telephone Encounter (Signed)
Refer to physical therapy for back and leg pain  Verify her neurology appt, she wasn't sure.  She was supposedly referred by hospital for TIA f/u.

## 2014-10-07 ENCOUNTER — Telehealth: Payer: Self-pay | Admitting: Medical

## 2014-10-07 NOTE — Telephone Encounter (Signed)
Verify her appt time with neurology for stroke f/u    Refer to physical therapy for back and leg pain

## 2014-10-11 ENCOUNTER — Other Ambulatory Visit: Payer: Self-pay | Admitting: Medical

## 2014-10-12 ENCOUNTER — Telehealth: Payer: Self-pay | Admitting: Medical

## 2014-10-12 NOTE — Telephone Encounter (Signed)
pls check on this 

## 2014-10-12 NOTE — Telephone Encounter (Signed)
Pt came in and dropped off fmla forms to be completed. Please complete and fax to [phone] and then let pt know and she will come to pick up her copy.

## 2014-10-12 NOTE — Telephone Encounter (Signed)
Ok to RF? 

## 2014-10-12 NOTE — Telephone Encounter (Signed)
Pt also stopped by and wanted to know the status of there neurology appt?

## 2014-10-13 ENCOUNTER — Other Ambulatory Visit: Payer: Self-pay | Admitting: Family Medicine

## 2014-10-13 DIAGNOSIS — R29898 Other symptoms and signs involving the musculoskeletal system: Secondary | ICD-10-CM

## 2014-10-13 DIAGNOSIS — G458 Other transient cerebral ischemic attacks and related syndromes: Secondary | ICD-10-CM

## 2014-10-13 NOTE — Telephone Encounter (Signed)
Orders are in EPIC to [facility] there office will contact the patient for her appointment

## 2014-10-19 ENCOUNTER — Telehealth: Payer: Self-pay | Admitting: Medical

## 2014-10-19 NOTE — Addendum Note (Signed)
Addended by: Carlena Hurl on: 10/19/2014 05:20 PM   Modules accepted: Level of Service

## 2014-10-19 NOTE — Telephone Encounter (Signed)
Regarding FMLA:  has she went back to work? If not, when is the neurology appt we have set up? has she been out of work since 09/29/14?

## 2014-10-19 NOTE — Telephone Encounter (Signed)
LMOM TO CB. CLS 

## 2014-10-20 NOTE — Telephone Encounter (Signed)
Patient states that she has returned to work. She went back on 10/12/14. Patient has an appt. With [facility] on 12/17/14 @ 100 pm with Dr. Posey Pronto

## 2014-10-22 NOTE — Telephone Encounter (Signed)
Do you know about this?

## 2014-10-22 NOTE — Telephone Encounter (Signed)
Done, send FMLA

## 2014-10-29 ENCOUNTER — Other Ambulatory Visit: Payer: Self-pay | Admitting: Medical

## 2014-10-30 NOTE — Telephone Encounter (Signed)
Is this okay to refill? 

## 2014-11-02 ENCOUNTER — Telehealth: Payer: Self-pay | Admitting: Medical

## 2014-11-02 MED ORDER — SIMVASTATIN 20 MG PO TABS: 20.0000 mg | ORAL_TABLET | Freq: Every day | ORAL | Status: DC

## 2014-11-02 NOTE — Telephone Encounter (Signed)
done

## 2014-11-02 NOTE — Telephone Encounter (Signed)
Pt needs refill on zocor sent to [facility] on [address]

## 2014-11-02 NOTE — Telephone Encounter (Signed)
RF fax from [facility] for Simvastatin 20mg  #30

## 2014-11-17 ENCOUNTER — Other Ambulatory Visit: Payer: Self-pay

## 2014-11-17 ENCOUNTER — Ambulatory Visit (INDEPENDENT_AMBULATORY_CARE_PROVIDER_SITE_OTHER): Payer: BLUE CROSS/BLUE SHIELD | Admitting: Medical

## 2014-11-17 ENCOUNTER — Encounter: Payer: Self-pay | Admitting: Medical

## 2014-11-17 ENCOUNTER — Telehealth: Payer: Self-pay | Admitting: Medical

## 2014-11-17 ENCOUNTER — Other Ambulatory Visit: Payer: Self-pay | Admitting: Medical

## 2014-11-17 VITALS — BP 110/76 | HR 65 | Ht 69.0 in | Wt 211.0 lb

## 2014-11-17 DIAGNOSIS — D509 Iron deficiency anemia, unspecified: Secondary | ICD-10-CM | POA: Diagnosis not present

## 2014-11-17 DIAGNOSIS — I639 Cerebral infarction, unspecified: Secondary | ICD-10-CM

## 2014-11-17 DIAGNOSIS — G459 Transient cerebral ischemic attack, unspecified: Secondary | ICD-10-CM

## 2014-11-17 DIAGNOSIS — R5383 Other fatigue: Secondary | ICD-10-CM

## 2014-11-17 DIAGNOSIS — R29898 Other symptoms and signs involving the musculoskeletal system: Secondary | ICD-10-CM

## 2014-11-17 DIAGNOSIS — F8081 Childhood onset fluency disorder: Secondary | ICD-10-CM | POA: Diagnosis not present

## 2014-11-17 DIAGNOSIS — R4702 Dysphasia: Secondary | ICD-10-CM | POA: Diagnosis not present

## 2014-11-17 DIAGNOSIS — M5441 Lumbago with sciatica, right side: Secondary | ICD-10-CM

## 2014-11-17 NOTE — Telephone Encounter (Signed)
Referral has been placed in EPIC

## 2014-11-17 NOTE — Progress Notes (Signed)
   Subjective: Chief Complaint  Patient presents with  . Follow-up    feeling fatigued & noticed she is stuttering more often   Here for recheck.  Last visit she was here for f/u from hospital from stroke, transition of care visit.    Since last visit she still reports fatigue, having stuttering, still having trouble coming up with words.  Sees neurology 9/16 in follow up.    She has been going to physical therapy for back and radicular pain since last visit, doing some home exercises as well.    She is back to work, and employer has put more fans in the area where she works to help with the heat.    No new c/o.   Past Medical History  Diagnosis Date  . Hypertension   . GERD (gastroesophageal reflux disease)   . Allergy   . Farsightedness     wears glasses, [facility]  . History of uterine fibroid   . Chronic back pain   . Polyarthralgia     normal rheumatoid screen 01/2012  . Constipation   . Chest pain 04/05/2011    cardiac eval, normal treadmill stress test, Dr. Tollie Eth  . Anemia     iron therapy for years as of 10/12; normal Hgb 08/2013  . Hyperlipidemia   . Arthritis    ROS as in subjective  Objective: BP 110/76 mmHg  Pulse 65  Ht 5\' 9"  (1.753 m)  Wt 211 lb (95.709 kg)  BMI 31.15 kg/m2  SpO2 95%  LMP 06/01/2013  General appearance: alert, no distress, WD/WN HEENT: normocephalic, sclerae anicteric, PERRLA, EOMi, nares patent, no discharge or erythema, pharynx normal Oral cavity: MMM, no lesions Neck: supple, no lymphadenopathy, no thyromegaly, no masses, no bruits Heart: RRR, normal S1, S2, no murmurs Lungs: CTA bilaterally, no wheezes, rhonchi, or rales Back: mild lumbar tenderness today, mild pain with back extension, otherwise nontender, relatively normal ROM Musculoskeletal: legs nontender, no swelling, no obvious deformity Extremities: no edema, no cyanosis, no clubbing Pulses: 2+ symmetric, upper and lower extremities, normal cap  refill Neurological: alert, oriented x 3, but seems to be slower to process words than prior, CN2-12 intact, strength normal upper extremities and lower extremities, sensation normal throughout, DTRs 2+ throughout, no cerebellar signs, gait normal Psychiatric: normal affect, slow to process words compared to usual, behavior normal otherwise, pleasant    Assessment: Encounter Diagnoses  Name Primary?  . Transient cerebral ischemia, unspecified transient cerebral ischemia type Yes  . Dysphasia   . Stuttering   . Left arm weakness   . Anemia, iron deficiency   . Bilateral low back pain with right-sided sciatica   . Other fatigue     Plan: Dysphasia, hx/o TIA, stuttering - referral to speech therapy, f/u as planned with neurology in September.    Left arm weakness - seems improved  Anemia - repeat labs today  Low back pain and radicular pain - s/p therapy, improved, c/t home exercises  Fatigue - repeat labs today for surveillance, c/t to work at her own pace, don't over do it, stay hydrated.  She has been back to work, doing ok in general.

## 2014-11-17 NOTE — Telephone Encounter (Signed)
Refer to speech therapy, recent stroke, problems formulating words, stuttering

## 2014-11-18 ENCOUNTER — Other Ambulatory Visit: Payer: Self-pay | Admitting: Medical

## 2014-11-18 LAB — BASIC METABOLIC PANEL
BUN: 17 mg/dL (ref 7–25)
CO2: 29 mmol/L (ref 20–31)
Calcium: 9.6 mg/dL (ref 8.6–10.4)
Chloride: 104 mmol/L (ref 98–110)
Creat: 0.51 mg/dL (ref 0.50–1.05)
Glucose, Bld: 82 mg/dL (ref 65–99)
Potassium: 4.5 mmol/L (ref 3.5–5.3)
Sodium: 140 mmol/L (ref 135–146)

## 2014-11-18 LAB — CBC
HCT: 39.9 % (ref 36.0–46.0)
Hemoglobin: 12.4 g/dL (ref 12.0–15.0)
MCH: 27.3 pg (ref 26.0–34.0)
MCHC: 31.1 g/dL (ref 30.0–36.0)
MCV: 87.9 fL (ref 78.0–100.0)
MPV: 9.3 fL (ref 8.6–12.4)
Platelets: 317 10*3/uL (ref 150–400)
RBC: 4.54 MIL/uL (ref 3.87–5.11)
RDW: 13.4 % (ref 11.5–15.5)
WBC: 6 10*3/uL (ref 4.0–10.5)

## 2014-11-18 LAB — TSH: TSH: 1.303 u[IU]/mL (ref 0.350–4.500)

## 2014-11-18 LAB — IRON AND TIBC
%SAT: 38 % (ref 20–55)
Iron: 95 ug/dL (ref 42–145)
TIBC: 252 ug/dL (ref 250–470)
UIBC: 157 ug/dL (ref 125–400)

## 2014-11-18 MED ORDER — FERROUS FUMARATE 325 (106 FE) MG PO TABS: 1.0000 | ORAL_TABLET | Freq: Every day | ORAL | Status: DC

## 2014-11-18 MED ORDER — LOSARTAN POTASSIUM-HCTZ 50-12.5 MG PO TABS: 1.0000 | ORAL_TABLET | Freq: Every day | ORAL | Status: DC

## 2014-11-18 MED ORDER — SIMVASTATIN 20 MG PO TABS: 20.0000 mg | ORAL_TABLET | Freq: Every day | ORAL | Status: DC

## 2014-11-18 MED ORDER — MELOXICAM 15 MG PO TABS: 15.0000 mg | ORAL_TABLET | Freq: Every day | ORAL | Status: DC

## 2014-11-18 MED ORDER — CYCLOBENZAPRINE HCL 10 MG PO TABS: 10.0000 mg | ORAL_TABLET | Freq: Every day | ORAL | Status: DC

## 2014-11-18 MED ORDER — POTASSIUM CHLORIDE CRYS ER 10 MEQ PO TBCR: 10.0000 meq | EXTENDED_RELEASE_TABLET | Freq: Once | ORAL | Status: DC

## 2014-12-14 ENCOUNTER — Telehealth: Payer: Self-pay

## 2014-12-14 MED ORDER — POTASSIUM CHLORIDE CRYS ER 10 MEQ PO TBCR: 10.0000 meq | EXTENDED_RELEASE_TABLET | Freq: Every day | ORAL | Status: DC

## 2014-12-14 NOTE — Telephone Encounter (Signed)
Corrected instructions to once daily per Audelia Acton, pharmacy informed

## 2014-12-14 NOTE — Telephone Encounter (Signed)
Please verify the dosage on this Rx.  It says to take once, but has 90 pills.  Should this say once daily for the Potassium  Chloride?

## 2014-12-17 ENCOUNTER — Encounter: Payer: Self-pay | Admitting: Neurology

## 2014-12-17 ENCOUNTER — Encounter: Payer: Self-pay | Admitting: *Deleted

## 2014-12-17 ENCOUNTER — Other Ambulatory Visit: Payer: Self-pay | Admitting: Medical

## 2014-12-17 ENCOUNTER — Ambulatory Visit (INDEPENDENT_AMBULATORY_CARE_PROVIDER_SITE_OTHER): Payer: BLUE CROSS/BLUE SHIELD | Admitting: Neurology

## 2014-12-17 VITALS — BP 140/80 | HR 61 | Ht 69.0 in | Wt 213.3 lb

## 2014-12-17 DIAGNOSIS — R42 Dizziness and giddiness: Secondary | ICD-10-CM

## 2014-12-17 DIAGNOSIS — R569 Unspecified convulsions: Secondary | ICD-10-CM | POA: Diagnosis not present

## 2014-12-17 DIAGNOSIS — R202 Paresthesia of skin: Secondary | ICD-10-CM

## 2014-12-17 DIAGNOSIS — R209 Unspecified disturbances of skin sensation: Secondary | ICD-10-CM

## 2014-12-17 DIAGNOSIS — M5416 Radiculopathy, lumbar region: Secondary | ICD-10-CM

## 2014-12-17 DIAGNOSIS — G43809 Other migraine, not intractable, without status migrainosus: Secondary | ICD-10-CM | POA: Diagnosis not present

## 2014-12-17 DIAGNOSIS — IMO0001 Reserved for inherently not codable concepts without codable children: Secondary | ICD-10-CM

## 2014-12-17 DIAGNOSIS — R6889 Other general symptoms and signs: Secondary | ICD-10-CM

## 2014-12-17 MED ORDER — GABAPENTIN 300 MG PO CAPS: 300.0000 mg | ORAL_CAPSULE | Freq: Every day | ORAL | Status: DC

## 2014-12-17 NOTE — Progress Notes (Signed)
[facility] Note - Initial Visit   Date: 12/17/2014  Lisa Briggs MRN: [id number] DOB: 1960/10/26   Dear Chana Bode, PA:  Thank you for your kind referral of Lisa Briggs for consultation of TIA. Although her history is well known to you, please allow [place] to reiterate it for the purpose of our medical record. The patient was accompanied to the clinic by self.  History of Present Illness: Lisa Briggs is a 54 y.o. right-handed African American female with hypertension, GERD, polyarthralgia, hyperlipidemia presenting for evaluation of recent TIA.  She is a poor historian.  Patient reports having a constellation of neurological symptoms which first began in January 2016.  She experienced tingling of the face and mouth with right leg heaviness. She went to the emergency room and was found to have low potassium.  Symptoms imprved spontaneously.  Again, several months later, she developed the same symptoms but this time with bilateral hand paresthesias.  Potassium was low again.  Most recently, in June she had another spell of facial paresthesias, bilateral leg pain, but this time it was associated with left arm weakness and word-finding difficulty.  She was admitted overnight for stroke work-up which returned negative.  MRI/A brain, US carotids, and echo was normal.  She was referred for further evaluation.   She also complains of bilateral shooting leg pain, worse on the right that travels down her lateral thigh and lower leg.  MRI lumbar spine from 2014 shows multilevel disc disease, as well as mild foraminal encroachment bilaterally at L2-3 and L3-4 as well as broad based right foraminal disc protrusion at L5-S1.  Out-side paper records, electronic medical record, and images have been reviewed where available and summarized as: Lab Results  Component Value Date   TSH 1.303 11/17/2014   MRI/A brain 09/29/2014: 1. Scattered periventricular and subcortical  T2 hyperintensities are slightly greater than expected for age. The finding is nonspecific but can be seen in the setting of chronic microvascular ischemia, a demyelinating process such as multiple sclerosis, vasculitis, complicated migraine headaches, or as the sequelae of a prior infectious or inflammatory process. 2. No acute abnormality.  No evidence for acute or subacute infarct. 3. Normal variant MRA circle of Willis without evidence for significant proximal stenosis, aneurysm, or branch vessel occlusion.  Echo 09/30/2014: - Normal EF, Moderate TR, mild MR, otherwise normal study.  US carotids 09/29/2014:  1-39% bilateral ICA stenosis.   MRI lumbar spine wo contrast 05/07/2014:   1. Multilevel disc disease and facet disease. 2. Multilevel lateral recess encroachment. 3. Mild bilateral foraminal encroachment at L2-3 and L3-4. 4. Mild left foraminal stenosis at L4-5. 5. Shallow broad-based right foraminal disc protrusion at L5-S1.  Past Medical History  Diagnosis Date  . Hypertension   . GERD (gastroesophageal reflux disease)   . Allergy   . Farsightedness     wears glasses, [facility]  . History of uterine fibroid   . Chronic back pain   . Polyarthralgia     normal rheumatoid screen 01/2012  . Constipation   . Chest pain 04/05/2011    cardiac eval, normal treadmill stress test, Dr. Tollie Eth  . Anemia     iron therapy for years as of 10/12; normal Hgb 08/2013  . Hyperlipidemia   . Arthritis     Past Surgical History  Procedure Laterality Date  . Lipoma excision      forehead  . Knee arthroscopy Left   . Uterine fibroid surgery    .  Gall stone surgery    . Colonoscopy  01/2014    diverticulosis, othwerise normal - Dr. Owens Loffler  . Esophagogastroduodenoscopy  2013    Dr. Benson Norway, gastritis  . Esophagogastroduodenoscopy  [id number]  . Eus N/A 04/16/2014    Procedure: UPPER ENDOSCOPIC ULTRASOUND (EUS) LINEAR;  Surgeon: Milus Banister, MD;  Location: [facility]  ENDOSCOPY;  Service: Endoscopy;  Laterality: N/A;  . Laparoscopic gastric resection N/A 06/09/2014    Procedure: LAPAROSCOPIC GASTRIC MASS RESECTION;  Surgeon: [name], MD;  Location: [facility] ORS;  Service: General;  Laterality: N/A;     Medications:  Outpatient Encounter Prescriptions as of 12/17/2014  Medication Sig  . aspirin EC 81 MG tablet Take 1 tablet (81 mg total) by mouth daily.  . cyclobenzaprine (FLEXERIL) 10 MG tablet Take 1 tablet (10 mg total) by mouth at bedtime.  Marland Kitchen dexlansoprazole (DEXILANT) 60 MG capsule Take 1 capsule (60 mg total) by mouth daily.  . famotidine (PEPCID) 20 MG tablet Take 1 tablet (20 mg total) by mouth at bedtime.  . ferrous fumarate (HEMOCYTE - 106 MG FE) 325 (106 FE) MG TABS tablet Take 1 tablet (106 mg of iron total) by mouth daily.  . fluticasone (FLONASE) 50 MCG/ACT nasal spray Place 1 spray into both nostrils daily as needed for allergies or rhinitis.  Marland Kitchen KLOR-CON M10 10 MEQ tablet TAKE ONE TABLET BY MOUTH ONCE DAILY  . loratadine (CLARITIN) 10 MG tablet Take 1 tablet (10 mg total) by mouth daily.  Marland Kitchen losartan-hydrochlorothiazide (HYZAAR) 50-12.5 MG per tablet Take 1 tablet by mouth daily.  . meloxicam (MOBIC) 15 MG tablet Take 1 tablet (15 mg total) by mouth daily.  . ondansetron (ZOFRAN) 4 MG tablet Take 1 tablet (4 mg total) by mouth every 8 (eight) hours as needed for nausea or vomiting.  . polyethylene glycol powder (GLYCOLAX/MIRALAX) powder TAKE ONE CAPFUL (255 GRAMS) BY MOUTH DAILY  . potassium chloride (K-DUR,KLOR-CON) 10 MEQ tablet Take 1 tablet (10 mEq total) by mouth daily.  . simvastatin (ZOCOR) 20 MG tablet Take 1 tablet (20 mg total) by mouth daily.  . traMADol (ULTRAM) 50 MG tablet Take 1 tablet (50 mg total) by mouth every 6 (six) hours as needed for moderate pain.  Marland Kitchen UNABLE TO FIND Lisa Briggs was [facility] to [facility] on 09/29/14 and discharged on 09/30/14. She may return to work no earlier than 10/03/14. Please call if there  are any questions. [phone].  . Vitamin D, Ergocalciferol, (DRISDOL) 50000 UNITS CAPS capsule Take 1 capsule (50,000 Units total) by mouth every 7 (seven) days.  Marland Kitchen gabapentin (NEURONTIN) 300 MG capsule Take 1 capsule (300 mg total) by mouth at bedtime.   No facility-administered encounter medications on file as of 12/17/2014.     Allergies: No Known Allergies  Family History: Family History  Problem Relation Age of Onset  . Hypertension Mother   . Arthritis Mother   . GER disease Mother   . Glaucoma Mother   . Stroke Father   . Alcohol abuse Father   . Breast cancer Sister     breast cancer dx late 2s; another sister with leukemia  . Hypertension Sister   . Heart disease Sister     heart murmur; other sisters with heart problems  . Diabetes Brother   . Diabetes Maternal Aunt   . Heart disease Maternal Aunt   . Heart disease Maternal Grandmother   . Heart disease Maternal Grandfather   . Heart disease Maternal Uncle   .  Stroke Maternal Uncle   . Colon cancer Brother 70  . Colon polyps Neg Hx     Social History: Social History  Substance Use Topics  . Smoking status: Never Smoker   . Smokeless tobacco: Never Used  . Alcohol Use: No   Social History   Social History Narrative   Married, has 1 son.  Glass blower/designer.  Active on job.  Does stretching and exercises daily as per physical therapy.  Works 12 hours daily.      Review of Systems:  CONSTITUTIONAL: No fevers, chills, night sweats, or weight loss.   EYES: No visual changes or eye pain ENT: No hearing changes.  No history of nose bleeds.   RESPIRATORY: No cough, wheezing and shortness of breath.   CARDIOVASCULAR: Negative for chest pain, and palpitations.   GI: Negative for abdominal discomfort, blood in stools or black stools.  No recent change in bowel habits.   GU:  No history of incontinence.   MUSCLOSKELETAL: +history of joint pain or swelling.  No myalgias.   SKIN: Negative for lesions, rash, and  itching.   HEMATOLOGY/ONCOLOGY: Negative for prolonged bleeding, bruising easily, and swollen nodes.  No history of cancer.   ENDOCRINE: Negative for cold or heat intolerance, polydipsia or goiter.   PSYCH:  No depression or anxiety symptoms.   NEURO: As Above.   Vital Signs:  BP 140/80 mmHg  Pulse 61  Ht 5\' 9"  (1.753 m)  Wt 213 lb 5 oz (96.758 kg)  BMI 31.49 kg/m2  SpO2 99%  LMP 06/01/2013   General Medical Exam:   General:  Well appearing, comfortable.   Eyes/ENT: see cranial nerve examination.   Neck: No masses appreciated.  Full range of motion without tenderness.  No carotid bruits. Respiratory:  Clear to auscultation, good air entry bilaterally.   Cardiac:  Regular rate and rhythm, no murmur.   Extremities:  No deformities, edema, or skin discoloration.  Skin:  No rashes or lesions.  Neurological Exam: MENTAL STATUS including orientation to time, place, person, recent and remote memory, attention span and concentration, language, and fund of knowledge is fair.  Speech is not dysarthric.  CRANIAL NERVES: II:  No visual field defects.  Unremarkable fundi.   III-IV-VI: Pupils equal round and reactive to light.  Normal conjugate, extra-ocular eye movements in all directions of gaze.  No nystagmus.  No ptosis.   V:  Normal facial sensation.   VII:  Normal facial symmetry and movements.  No pathologic facial reflexes.  VIII:  Normal hearing and vestibular function.   IX-X:  Normal palatal movement.   XI:  Normal shoulder shrug and head rotation.   XII:  Normal tongue strength and range of motion, no deviation or fasciculation.  MOTOR:  No atrophy, fasciculations or abnormal movements.  No pronator drift.  Tone is normal.    Right Upper Extremity:    Left Upper Extremity:    Deltoid  5/5   Deltoid  5/5   Biceps  5/5   Biceps  5/5   Triceps  5/5   Triceps  5/5   Wrist extensors  5/5   Wrist extensors  5/5   Wrist flexors  5/5   Wrist flexors  5/5   Finger extensors  5/5    Finger extensors  5/5   Finger flexors  5/5   Finger flexors  5/5   Dorsal interossei  5/5   Dorsal interossei  5/5   Abductor pollicis  5/5   Abductor  pollicis  5/5   Tone (Ashworth scale)  0  Tone (Ashworth scale)  0   Right Lower Extremity:    Left Lower Extremity:    Hip flexors  5/5   Hip flexors  5/5   Hip extensors  5/5   Hip extensors  5/5   Knee flexors  5/5   Knee flexors  5/5   Knee extensors  5/5   Knee extensors  5/5   Dorsiflexors  5/5   Dorsiflexors  5/5   Plantarflexors  5/5   Plantarflexors  5/5   Toe extensors  5/5   Toe extensors  5/5   Toe flexors  5/5   Toe flexors  5/5   Tone (Ashworth scale)  0  Tone (Ashworth scale)  0   MSRs:  Right                                                                 Left brachioradialis 2+  brachioradialis 2+  biceps 2+  biceps 2+  triceps 2+  triceps 2+  patellar 2+  patellar 2+  ankle jerk 2+  ankle jerk 2+  Hoffman no  Hoffman no  plantar response down  plantar response down   SENSORY:  Normal and symmetric perception of light touch, pinprick, vibration, and proprioception.  Romberg's sign absent.   COORDINATION/GAIT: Normal finger-to- nose-finger and heel-to-shin.  Intact rapid alternating movements bilaterally.  Able to rise from a chair without using arms.  Gait narrow based and stable. Tandem and stressed gait intact.    IMPRESSION: Lisa Briggs is a 54 year-old female presenting for evaluation of various spells of neurological events involving bifacial and mouth paresthesias, bilateral hand paresthesias, and bilateral leg pain.  Her most recent spell in June was considered to be a TIA due to predominately left sided weakness, but it is unusual to have bifacial paresthesias.  She does endorse having left sided headaches with these events, so I question whether she is really experiencing a migraine variant.  Stroke work-up including MRI/A brain, US carotids, and echo was normal which is reassuring.  To complete evaluation,  cardiac monitoring will be ordered.   Because she has some stereotyped spells, routine EEG will be ordered, but I have low suspicion these represent seizures.   She does have lumbar foraminal narrowing which is causing her right leg radicular pain.  She has completed physical therapy and is taking muscle relaxants without any improvement.  Referral for ESI was declined.  PLAN/RECOMMENDATIONS:  1.  Start neurontin 300mg  at bedtime 2.  Cardiac monitor 3.  Routine EEG 4.  Continue aspirin 81mg  daily 5.  If spells continue to occur, she may need treatment for migraine variant  Return to clinic in 3 months  The duration of this appointment visit was 40 minutes of face-to-face time with the patient.  Greater than 50% of this time was spent in counseling, explanation of diagnosis, planning of further management, and coordination of care.   Thank you for allowing me to participate in patient's care.  If I can answer any additional questions, I would be pleased to do so.    Sincerely,    Aalyah Mansouri K. Posey Pronto, DO

## 2014-12-17 NOTE — Patient Instructions (Addendum)
1.  Start neurontin 300mg  at bedtime 2.  Cardiac monitor 3.  Routine EEG 4.  Continue aspirin 81mg  daily 5.  If your leg pain worsens, you may want to consider seeing pain management  Return to clinic in 3 months

## 2014-12-21 ENCOUNTER — Ambulatory Visit (INDEPENDENT_AMBULATORY_CARE_PROVIDER_SITE_OTHER): Payer: BLUE CROSS/BLUE SHIELD | Admitting: Neurology

## 2014-12-21 ENCOUNTER — Encounter: Payer: Self-pay | Admitting: Neurology

## 2014-12-21 DIAGNOSIS — M5416 Radiculopathy, lumbar region: Secondary | ICD-10-CM | POA: Diagnosis not present

## 2014-12-21 DIAGNOSIS — G43809 Other migraine, not intractable, without status migrainosus: Secondary | ICD-10-CM

## 2014-12-21 DIAGNOSIS — R42 Dizziness and giddiness: Secondary | ICD-10-CM

## 2014-12-25 NOTE — Procedures (Signed)
ELECTROENCEPHALOGRAM REPORT  Date of Study: 12/21/2014  Patient's Name: Lisa Briggs MRN: [id number] Date of Birth: June 19, 1960  Referring Provider: Dr. Narda Amber  Clinical History: This is a 54 year old woman with spells of bifacial and mouth paresthesias, left-sided weakness.  Medications: Neurontin, ASA, Flexeril, Dexilant, Pepcid, Hemocyte 106 Fen, Flonase, Klor-Con, Claritin, Hyzaar, Mobic, Zofran, Glycolax/Miralax, K-Dur,Clor-Con, Zocor, Ultram, Drisdol  Technical Summary: A multichannel digital EEG recording measured by the international 10-20 system with electrodes applied with paste and impedances below 5000 ohms performed in our laboratory with EKG monitoring in an awake and asleep patient.  Hyperventilation and photic stimulation were performed.  The digital EEG was referentially recorded, reformatted, and digitally filtered in a variety of bipolar and referential montages for optimal display.    Description: The patient is awake and asleep during the recording.  During maximal wakefulness, there is a symmetric, medium voltage 10.5 Hz posterior dominant rhythm that attenuates with eye opening.  The record is symmetric.  During drowsiness and sleep, there is an increase in theta slowing of the background, with shifting asymmetry over the bilateral temporal regions.  Vertex waves and symmetric sleep spindles were seen.  Hyperventilation and photic stimulation did not elicit any abnormalities.  There were no epileptiform discharges or electrographic seizures seen.    EKG lead was unremarkable.  Impression: This awake and asleep EEG is within normal limits.   Clinical Correlation: A normal EEG does not exclude a clinical diagnosis of epilepsy. Clinical correlation is advised.   Ellouise Newer, M.D.

## 2014-12-28 ENCOUNTER — Encounter: Payer: Self-pay | Admitting: *Deleted

## 2014-12-28 ENCOUNTER — Ambulatory Visit (INDEPENDENT_AMBULATORY_CARE_PROVIDER_SITE_OTHER): Payer: BLUE CROSS/BLUE SHIELD

## 2014-12-28 DIAGNOSIS — G43809 Other migraine, not intractable, without status migrainosus: Secondary | ICD-10-CM

## 2014-12-28 DIAGNOSIS — R42 Dizziness and giddiness: Secondary | ICD-10-CM

## 2014-12-28 DIAGNOSIS — M5416 Radiculopathy, lumbar region: Secondary | ICD-10-CM

## 2015-01-06 ENCOUNTER — Telehealth: Payer: Self-pay

## 2015-01-06 NOTE — Telephone Encounter (Signed)
Patient st at time of notification yesterday, she was working hard packing boxes for work. She was out of breath as well.  Symptoms resolved with rest. Patient is asymptomatic now. Patient understands we will continue to watch her and to call if she has any concerns.

## 2015-01-06 NOTE — Telephone Encounter (Signed)
Cardiac report received that on 01/05/15 at 1103 CST patient had ST up to 162 bpm. Called patient to see what she was doing and to see if she was symptomatic at the time. Left message to call back.

## 2015-01-08 ENCOUNTER — Encounter: Payer: Self-pay | Admitting: *Deleted

## 2015-01-18 ENCOUNTER — Telehealth: Payer: Self-pay | Admitting: Neurology

## 2015-01-18 ENCOUNTER — Encounter: Payer: Self-pay | Admitting: Family Medicine

## 2015-01-18 ENCOUNTER — Ambulatory Visit (INDEPENDENT_AMBULATORY_CARE_PROVIDER_SITE_OTHER): Payer: BLUE CROSS/BLUE SHIELD | Admitting: Family Medicine

## 2015-01-18 VITALS — BP 140/90 | HR 72 | Temp 98.0°F | Wt 214.2 lb

## 2015-01-18 DIAGNOSIS — Z23 Encounter for immunization: Secondary | ICD-10-CM

## 2015-01-18 DIAGNOSIS — J309 Allergic rhinitis, unspecified: Secondary | ICD-10-CM | POA: Diagnosis not present

## 2015-01-18 DIAGNOSIS — I10 Essential (primary) hypertension: Secondary | ICD-10-CM | POA: Diagnosis not present

## 2015-01-18 DIAGNOSIS — J019 Acute sinusitis, unspecified: Secondary | ICD-10-CM

## 2015-01-18 NOTE — Telephone Encounter (Signed)
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

## 2015-01-18 NOTE — Patient Instructions (Addendum)
At this point your symptoms point to having a virus. Try taking Tylenol for body aches and using salt water gargles and lozenges for sore throat. Continue taking your Claritin and you may try a saline nasal spray or over the counter Flonase for nasal congestion. Let us know if you are not improving in next 4-5 days.   Pharyngitis Pharyngitis is redness, pain, and swelling (inflammation) of your pharynx.  CAUSES  Pharyngitis is usually caused by infection. Most of the time, these infections are from viruses (viral) and are part of a cold. However, sometimes pharyngitis is caused by bacteria (bacterial). Pharyngitis can also be caused by allergies. Viral pharyngitis may be spread from person to person by coughing, sneezing, and personal items or utensils (cups, forks, spoons, toothbrushes). Bacterial pharyngitis may be spread from person to person by more intimate contact, such as kissing.  SIGNS AND SYMPTOMS  Symptoms of pharyngitis include:   Sore throat.   Tiredness (fatigue).   Low-grade fever.   Headache.  Joint pain and muscle aches.  Skin rashes.  Swollen lymph nodes.  Plaque-like film on throat or tonsils (often seen with bacterial pharyngitis). DIAGNOSIS  Your health care provider will ask you questions about your illness and your symptoms. Your medical history, along with a physical exam, is often all that is needed to diagnose pharyngitis. Sometimes, a rapid strep test is done. Other lab tests may also be done, depending on the suspected cause.  TREATMENT  Viral pharyngitis will usually get better in 3-4 days without the use of medicine. Bacterial pharyngitis is treated with medicines that kill germs (antibiotics).  HOME CARE INSTRUCTIONS   Drink enough water and fluids to keep your urine clear or pale yellow.   Only take over-the-counter or prescription medicines as directed by your health care provider:   If you are prescribed antibiotics, make sure you finish them  even if you start to feel better.   Do not take aspirin.   Get lots of rest.   Gargle with 8 oz of salt water ( tsp of salt per 1 qt of water) as often as every 1-2 hours to soothe your throat.   Throat lozenges (if you are not at risk for choking) or sprays may be used to soothe your throat. SEEK MEDICAL CARE IF:   You have large, tender lumps in your neck.  You have a rash.  You cough up green, yellow-brown, or bloody spit. SEEK IMMEDIATE MEDICAL CARE IF:   Your neck becomes stiff.  You drool or are unable to swallow liquids.  You vomit or are unable to keep medicines or liquids down.  You have severe pain that does not go away with the use of recommended medicines.  You have trouble breathing (not caused by a stuffy nose). MAKE SURE YOU:   Understand these instructions.  Will watch your condition.  Will get help right away if you are not doing well or get worse.   This information is not intended to replace advice given to you by your health care provider. Make sure you discuss any questions you have with your health care provider.   Document Released: 03/20/2005 Document Revised: 01/08/2013 Document Reviewed: 11/25/2012 Elsevier Interactive Patient Education [employer].

## 2015-01-18 NOTE — Telephone Encounter (Signed)
PT called and asked if her paperwork was ready for pick up and if not ready she wants to know if you can right a letter stating that it's going to be late/Dawn CB# [phone]

## 2015-01-18 NOTE — Progress Notes (Signed)
   Subjective:    Patient ID: Lisa Briggs, female    DOB: 07-12-1960, 54 y.o.   MRN: [id number]  HPI Chief Complaint  Patient presents with  . sinus    sinus issues- headache, ear pain, drainage causing her throat to be sore. no cough. running nose   She is here with complaints of sinus pressure, headache, drainage, right ear pain, sore throat and body aches since yesterday. Denies fever, chills, cough, nausea, vomiting. No known sick contacts. Does not smoke. States her blood pressure has been up today. She reports good medication compliance. She is wearing a cardiac even monitor due to having a recent stroke.   Reviewed allergies, medications, past medical and social history.    Review of Systems Pertinent positives and negatives in the history of present illness.    Objective:   Physical Exam  Constitutional: She appears well-developed and well-nourished. No distress.  HENT:  Right Ear: Tympanic membrane and ear canal normal.  Left Ear: Tympanic membrane and ear canal normal.  Nose: Mucosal edema present.  Mouth/Throat: Oropharynx is clear and moist and mucous membranes are normal. No oropharyngeal exudate or posterior oropharyngeal edema.  Neck: Normal range of motion. Neck supple.  Cardiovascular: Normal rate and regular rhythm.   Cardiac monitor in place to left chest wall.  Pulmonary/Chest: Effort normal and breath sounds normal.  Lymphadenopathy:    She has no cervical adenopathy.       Right: No supraclavicular adenopathy present.       Left: No supraclavicular adenopathy present.          Assessment & Plan:  Acute rhinosinusitis  Need for prophylactic vaccination and inoculation against influenza - Plan: Flu Vaccine QUAD 36+ mos IM  Essential hypertension  Allergic rhinitis, unspecified allergic rhinitis type  Discussed that her symptoms are suspicious for viral infection and also discussed inappropriate antibiotic use. Recommend symptomatic treatment and  continuing to take current allergy medication. She will let me know if no improvement in 4-5 days or if her symptoms worsen over next few days. Also discussed that if her blood pressure continues to be >140/90 that she let us know.  Flu shot given per patient request.

## 2015-01-18 NOTE — Telephone Encounter (Signed)
Pt called to check if FMLA documents are ready for pick up// call back [phone]

## 2015-01-19 ENCOUNTER — Telehealth: Payer: Self-pay | Admitting: Neurology

## 2015-01-19 NOTE — Telephone Encounter (Signed)
VM-Mary called in regards to the certification that was faxed and they need pages 4-7 refaxed/Dawn Fax# [phone]

## 2015-01-19 NOTE — Telephone Encounter (Signed)
Felecia called patient to let her know that papers are ready.

## 2015-01-20 ENCOUNTER — Other Ambulatory Visit: Payer: Self-pay

## 2015-01-20 DIAGNOSIS — Z1231 Encounter for screening mammogram for malignant neoplasm of breast: Secondary | ICD-10-CM

## 2015-01-21 ENCOUNTER — Telehealth: Payer: Self-pay | Admitting: Family Medicine

## 2015-01-21 MED ORDER — BENZONATATE 200 MG PO CAPS: 200.0000 mg | ORAL_CAPSULE | Freq: Two times a day (BID) | ORAL | Status: DC | PRN

## 2015-01-21 NOTE — Telephone Encounter (Signed)
Spoke to patient and she states she has a cough now both dry and productive. Tried OTC advil, motrin, flonase and no relief. Pt can not take mucinex dm as it messes up her stomach so i put this in her allergies. Pt was advise we could send in tessalon perles to pharmacy

## 2015-01-21 NOTE — Telephone Encounter (Signed)
Pt called and stated that she is still very sick. She stated she is not any better. Pt did sound very sick. Pt uses [facility] on [address] and phone number is [phone].

## 2015-01-21 NOTE — Telephone Encounter (Signed)
Please let her know that as we discussed this could take 7-10 days for her to start feeling better. This is day 4 of her symptoms. This is the normal course of a virus. Does she have any changes or new symptoms? What is she taking for her symptoms and how often?

## 2015-01-22 ENCOUNTER — Telehealth: Payer: Self-pay | Admitting: *Deleted

## 2015-01-22 NOTE — Telephone Encounter (Signed)
Received a 2nd auto detect transmission on the patient this afternoon. Katy, monitor tech, had spoken with the patient earlier today to have her send a reading, but nothing received until this afternoon. The patient had HR's of 150 bpm 2:08 PM (CT). Reviewed with Dr. Burt Knack (DOD)- per Dr. Burt Knack- " suspect atrial flutter w/ RVR- now after hours- would go to the ERfor 12-lead EKG & treatment."  I called the patient and advised her of MD recommendations. Earlier today she told me she was at rest yesterday when her episode occurred. This afternoon, she tells me she was looking around in the deep freezer for something to cook for dinner, then went to lay down (HR- 170) With her episode this afternoon, she tells me she was doing some light housework (HR- 150). I advised her to go the ER for evaluation. As she sounds somewhat hesitant to do this, I have advised her to at least have evaluation at the [facility] which is close to her house. She states she will go there for evaluation. Urgent Care hours reviewed with the patient. I advised her we would call her back on Monday to follow up on how she is doing.

## 2015-01-22 NOTE — Telephone Encounter (Signed)
Serious notification received from [employer]. 01/21/15 2:08 PM (CT)- report states ST w/ artifact HR- 170 bpm. I called and spoke with the patient to see how she was feeling about 3:00 pm (ET) yesterday. She states she is dealing with a severe sinus congestion and you can audibly hear this in her voice. She reports that she was laying down at that time and felt "whoozy."  The connection with her is not very good and it is hard to hear her very well over the phone. I inquired how long this may have lasted for her and it was hard to hear her. She is feeling ok now. I advised I would review with Dr. Burt Knack and call her back.  Per Dr. Burt Knack after review of Serious Notification- "suspect atrial flutter w/ RVR. Please pull monitor strips to evaluate duration of this."  Reviewed with Abram Sander- monitor tech- she called [employer] to see if a longer strip could be sent. Per [employer], since it is only a "Serious" and not "Critical" notification, they will only record for 1 minute.  I have called the patient to please send another recording to verify she is back in rhythm. I will follow up with her later this afternoon.

## 2015-01-23 ENCOUNTER — Emergency Department (HOSPITAL_COMMUNITY)
Admission: EM | Admit: 2015-01-23 | Discharge: 2015-01-23 | Disposition: A | Discharge status: Home / Self Care | Payer: BLUE CROSS/BLUE SHIELD | Attending: Emergency Medicine | Admitting: Emergency Medicine

## 2015-01-23 ENCOUNTER — Encounter (HOSPITAL_COMMUNITY): Payer: Self-pay | Admitting: Emergency Medicine

## 2015-01-23 DIAGNOSIS — H9202 Otalgia, left ear: Secondary | ICD-10-CM | POA: Diagnosis not present

## 2015-01-23 DIAGNOSIS — G8929 Other chronic pain: Secondary | ICD-10-CM | POA: Insufficient documentation

## 2015-01-23 DIAGNOSIS — Z8742 Personal history of other diseases of the female genital tract: Secondary | ICD-10-CM | POA: Insufficient documentation

## 2015-01-23 DIAGNOSIS — I10 Essential (primary) hypertension: Secondary | ICD-10-CM | POA: Insufficient documentation

## 2015-01-23 DIAGNOSIS — M1990 Unspecified osteoarthritis, unspecified site: Secondary | ICD-10-CM | POA: Diagnosis not present

## 2015-01-23 DIAGNOSIS — Z79899 Other long term (current) drug therapy: Secondary | ICD-10-CM | POA: Insufficient documentation

## 2015-01-23 DIAGNOSIS — D649 Anemia, unspecified: Secondary | ICD-10-CM | POA: Diagnosis not present

## 2015-01-23 DIAGNOSIS — Z7982 Long term (current) use of aspirin: Secondary | ICD-10-CM | POA: Diagnosis not present

## 2015-01-23 DIAGNOSIS — K5900 Constipation, unspecified: Secondary | ICD-10-CM | POA: Insufficient documentation

## 2015-01-23 DIAGNOSIS — K219 Gastro-esophageal reflux disease without esophagitis: Secondary | ICD-10-CM | POA: Diagnosis not present

## 2015-01-23 DIAGNOSIS — Z791 Long term (current) use of non-steroidal anti-inflammatories (NSAID): Secondary | ICD-10-CM | POA: Diagnosis not present

## 2015-01-23 DIAGNOSIS — E785 Hyperlipidemia, unspecified: Secondary | ICD-10-CM | POA: Insufficient documentation

## 2015-01-23 DIAGNOSIS — R9431 Abnormal electrocardiogram [ECG] [EKG]: Secondary | ICD-10-CM | POA: Diagnosis present

## 2015-01-23 DIAGNOSIS — H748X2 Other specified disorders of left middle ear and mastoid: Secondary | ICD-10-CM | POA: Diagnosis not present

## 2015-01-23 DIAGNOSIS — J069 Acute upper respiratory infection, unspecified: Secondary | ICD-10-CM | POA: Diagnosis not present

## 2015-01-23 NOTE — ED Provider Notes (Addendum)
CSN: [id number]     Arrival date & time 01/23/15  1251 History   First MD Initiated Contact with Patient 01/23/15 1300     Chief Complaint  Patient presents with  . Abnormal ECG     (Consider location/radiation/quality/duration/timing/severity/associated sxs/prior Treatment) HPI Comments: Patient is a 54 year old female with a history of hypertension, GERD, anemia and hyperlipidemia who recently had a TIA and is wearing a cardiac monitor. She presents today because she was called by cardiology to come for evaluation. Based on their notes she had 2 episodes of rapid heart rate 150 and 170 consistent with atrial flutter. Patient denies ever feeling palpitations, shortness of breath, chest pain or tightness. She states for the last 6 days she had had URI symptoms including nasal congestion, rhinorrhea, ear pain, sore throat and hoarse voice. Occasional cough but no fever, wheezing or shortness of breath. Using Flonase and Claritin. She denies any excessive caffeine use or over-the-counter medications.  The history is provided by the patient.    Past Medical History  Diagnosis Date  . Hypertension   . GERD (gastroesophageal reflux disease)   . Allergy   . Farsightedness     wears glasses, [facility]  . History of uterine fibroid   . Chronic back pain   . Polyarthralgia     normal rheumatoid screen 01/2012  . Constipation   . Chest pain 04/05/2011    cardiac eval, normal treadmill stress test, Dr. Tollie Eth  . Anemia     iron therapy for years as of 10/12; normal Hgb 08/2013  . Hyperlipidemia   . Arthritis    Past Surgical History  Procedure Laterality Date  . Lipoma excision      forehead  . Knee arthroscopy Left   . Uterine fibroid surgery    . Gall stone surgery    . Colonoscopy  01/2014    diverticulosis, othwerise normal - Dr. Owens Loffler  . Esophagogastroduodenoscopy  2013    Dr. Benson Norway, gastritis  . Esophagogastroduodenoscopy  [id number]  . Eus N/A 04/16/2014   Procedure: UPPER ENDOSCOPIC ULTRASOUND (EUS) LINEAR;  Surgeon: Milus Banister, MD;  Location: [facility];  Service: Endoscopy;  Laterality: N/A;  . Laparoscopic gastric resection N/A 06/09/2014    Procedure: LAPAROSCOPIC GASTRIC MASS RESECTION;  Surgeon: [name], MD;  Location: [facility];  Service: General;  Laterality: N/A;   Family History  Problem Relation Age of Onset  . Hypertension Mother   . Arthritis Mother   . GER disease Mother   . Glaucoma Mother   . Stroke Father   . Alcohol abuse Father   . Breast cancer Sister     breast cancer dx late 12s; another sister with leukemia  . Hypertension Sister   . Heart disease Sister     heart murmur; other sisters with heart problems  . Diabetes Brother   . Diabetes Maternal Aunt   . Heart disease Maternal Aunt   . Heart disease Maternal Grandmother   . Heart disease Maternal Grandfather   . Heart disease Maternal Uncle   . Stroke Maternal Uncle   . Colon cancer Brother 97  . Colon polyps Neg Hx    Social History  Substance Use Topics  . Smoking status: Never Smoker   . Smokeless tobacco: Never Used  . Alcohol Use: No   OB History    No data available     Review of Systems  All other systems reviewed and are negative.  Allergies  Mucinex dm  Home Medications   Prior to Admission medications   Medication Sig Start Date End Date Taking? Authorizing Provider  aspirin EC 81 MG tablet Take 1 tablet (81 mg total) by mouth daily. 09/30/14   Bonnielee Haff, MD  benzonatate (TESSALON) 200 MG capsule Take 1 capsule (200 mg total) by mouth 2 (two) times daily as needed for cough. 01/21/15   Girtha Rm, NP  cyclobenzaprine (FLEXERIL) 10 MG tablet Take 1 tablet (10 mg total) by mouth at bedtime. 11/18/14   Camelia Eng Tysinger, PA-C  dexlansoprazole (DEXILANT) 60 MG capsule Take 1 capsule (60 mg total) by mouth daily. 07/20/14   Camelia Eng Tysinger, PA-C  famotidine (PEPCID) 20 MG tablet Take 1 tablet (20 mg total) by mouth  at bedtime. 05/25/14   Camelia Eng Tysinger, PA-C  ferrous fumarate (HEMOCYTE - 106 MG FE) 325 (106 FE) MG TABS tablet Take 1 tablet (106 mg of iron total) by mouth daily. 11/18/14   Camelia Eng Tysinger, PA-C  fluticasone (FLONASE) 50 MCG/ACT nasal spray Place 1 spray into both nostrils daily as needed for allergies or rhinitis. 05/25/14   Camelia Eng Tysinger, PA-C  gabapentin (NEURONTIN) 300 MG capsule Take 1 capsule (300 mg total) by mouth at bedtime. 12/17/14   Donika Keith Rake, DO  loratadine (CLARITIN) 10 MG tablet Take 1 tablet (10 mg total) by mouth daily. 05/25/14   Camelia Eng Tysinger, PA-C  losartan-hydrochlorothiazide (HYZAAR) 50-12.5 MG per tablet Take 1 tablet by mouth daily. 11/18/14   Camelia Eng Tysinger, PA-C  meloxicam (MOBIC) 15 MG tablet Take 1 tablet (15 mg total) by mouth daily. 11/18/14   Camelia Eng Tysinger, PA-C  ondansetron (ZOFRAN) 4 MG tablet Take 1 tablet (4 mg total) by mouth every 8 (eight) hours as needed for nausea or vomiting. 08/10/14   Camelia Eng Tysinger, PA-C  polyethylene glycol powder (GLYCOLAX/MIRALAX) powder TAKE ONE CAPFUL (255 GRAMS) BY MOUTH DAILY 11/18/14   Denita Lung, MD  potassium chloride (K-DUR,KLOR-CON) 10 MEQ tablet Take 1 tablet (10 mEq total) by mouth daily. 12/14/14   Camelia Eng Tysinger, PA-C  simvastatin (ZOCOR) 20 MG tablet Take 1 tablet (20 mg total) by mouth daily. 11/18/14   Camelia Eng Tysinger, PA-C  traMADol (ULTRAM) 50 MG tablet Take 1 tablet (50 mg total) by mouth every 6 (six) hours as needed for moderate pain. 10/06/14   Camelia Eng Tysinger, PA-C  Vitamin D, Ergocalciferol, (DRISDOL) 50000 UNITS CAPS capsule Take 1 capsule (50,000 Units total) by mouth every 7 (seven) days. 07/20/14   Camelia Eng Tysinger, PA-C   BP 108/68 mmHg  Pulse 85  Temp(Src) 97.5 F (36.4 C) (Oral)  Resp 21  Ht 5' 9.5" (1.765 m)  Wt 214 lb (97.07 kg)  BMI 31.16 kg/m2  SpO2 98%  LMP 06/01/2013 Physical Exam  Constitutional: She is oriented to person, place, and time. She appears well-developed and  well-nourished. No distress.  HENT:  Head: Normocephalic and atraumatic.  Right Ear: Tympanic membrane normal.  Left Ear: A middle ear effusion is present.  Nose: Mucosal edema present. Right sinus exhibits no maxillary sinus tenderness and no frontal sinus tenderness. Left sinus exhibits no maxillary sinus tenderness and no frontal sinus tenderness.  Mouth/Throat: Posterior oropharyngeal erythema present. No oropharyngeal exudate, posterior oropharyngeal edema or tonsillar abscesses.  Eyes: Conjunctivae and EOM are normal. Pupils are equal, round, and reactive to light.  Neck: Normal range of motion. Neck supple.  Cardiovascular: Normal rate, regular rhythm and  intact distal pulses.   No murmur heard. Pulmonary/Chest: Effort normal and breath sounds normal. No respiratory distress. She has no wheezes. She has no rales.  Abdominal: Soft. She exhibits no distension. There is no tenderness. There is no rebound and no guarding.  Musculoskeletal: Normal range of motion. She exhibits no edema or tenderness.  Neurological: She is alert and oriented to person, place, and time.  Skin: Skin is warm and dry. No rash noted. No erythema.  Psychiatric: She has a normal mood and affect. Her behavior is normal.  Nursing note and vitals reviewed.   ED Course  Procedures (including critical care time) Labs Review Labs Reviewed - No data to display  Imaging Review No results found. I have personally reviewed and evaluated these images and lab results as part of my medical decision-making.   EKG Interpretation   Date/Time:  Saturday January 23 2015 12:52:05 EDT Ventricular Rate:  93 PR Interval:  136 QRS Duration: 90 QT Interval:  380 QTC Calculation: 472 R Axis:   57 Text Interpretation:  Normal sinus rhythm Right atrial enlargement Minimal  voltage criteria for LVH, may be normal variant Nonspecific T wave  abnormality Prolonged QT No significant change since last tracing  Confirmed by  Maryan Rued  MD, Loree Fee ([id number]) on 01/23/2015 1:50:14 PM      MDM   Final diagnoses:  URI (upper respiratory infection)    Patient is a 54 year old female with a history of TIA who is currently wearing a heart monitor and was called by cardiology today to come in for further evaluation. Apparently per notes patient had 2 episodes of rapid heart rate up to 170 concerning for atrial flutter. Patient is currently asymptomatic and denies any chest pain, pressure, palpitations or shortness of breath at any time other than if she walks up a flight of stairs. EKG here shows sinus rhythm less than 100. Patient has had URI symptoms for the last 6 days with nasal congestion, sore throat and intermittent ear pain however no fever and clear breath sounds on exam. She has normal oxygen saturation is in no acute distress.  Patient had lab work done in August with a normal renal function and normal TSH. At this time patient is well-appearing think she has a viral URI that does not require antibiotics.    Blanchie Dessert, MD 01/23/15 [place], MD 01/23/15 1352

## 2015-01-23 NOTE — ED Notes (Signed)
Pt. Stated, I have a heart monitor on and they called me to say I needed to come here cause I had a abnormal beat.  I have no symptoms

## 2015-01-23 NOTE — Discharge Instructions (Signed)
Upper Respiratory Infection, Adult Most upper respiratory infections (URIs) are a viral infection of the air passages leading to the lungs. A URI affects the nose, throat, and upper air passages. The most common type of URI is nasopharyngitis and is typically referred to as "the common cold." URIs run their course and usually go away on their own. Most of the time, a URI does not require medical attention, but sometimes a bacterial infection in the upper airways can follow a viral infection. This is called a secondary infection. Sinus and middle ear infections are common types of secondary upper respiratory infections. Bacterial pneumonia can also complicate a URI. A URI can worsen asthma and chronic obstructive pulmonary disease (COPD). Sometimes, these complications can require emergency medical care and may be life threatening.  CAUSES Almost all URIs are caused by viruses. A virus is a type of germ and can spread from one person to another.  RISKS FACTORS You may be at risk for a URI if:   You smoke.   You have chronic heart or lung disease.  You have a weakened defense (immune) system.   You are very young or very old.   You have nasal allergies or asthma.  You work in crowded or poorly ventilated areas.  You work in health care facilities or schools. SIGNS AND SYMPTOMS  Symptoms typically develop 2-3 days after you come in contact with a cold virus. Most viral URIs last 7-10 days. However, viral URIs from the influenza virus (flu virus) can last 14-18 days and are typically more severe. Symptoms may include:   Runny or stuffy (congested) nose.   Sneezing.   Cough.   Sore throat.   Headache.   Fatigue.   Fever.   Loss of appetite.   Pain in your forehead, behind your eyes, and over your cheekbones (sinus pain).  Muscle aches.  DIAGNOSIS  Your health care provider may diagnose a URI by:  Physical exam.  Tests to check that your symptoms are not due to  another condition such as:  Strep throat.  Sinusitis.  Pneumonia.  Asthma. TREATMENT  A URI goes away on its own with time. It cannot be cured with medicines, but medicines may be prescribed or recommended to relieve symptoms. Medicines may help:  Reduce your fever.  Reduce your cough.  Relieve nasal congestion. HOME CARE INSTRUCTIONS   Take medicines only as directed by your health care provider.   Gargle warm saltwater or take cough drops to comfort your throat as directed by your health care provider.  Use a warm mist humidifier or inhale steam from a shower to increase air moisture. This may make it easier to breathe.  Drink enough fluid to keep your urine clear or pale yellow.   Eat soups and other clear broths and maintain good nutrition.   Rest as needed.   Return to work when your temperature has returned to normal or as your health care provider advises. You may need to stay home longer to avoid infecting others. You can also use a face mask and careful hand washing to prevent spread of the virus.  Increase the usage of your inhaler if you have asthma.   Do not use any tobacco products, including cigarettes, chewing tobacco, or electronic cigarettes. If you need help quitting, ask your health care provider. PREVENTION  The best way to protect yourself from getting a cold is to practice good hygiene.   Avoid oral or hand contact with people with cold   symptoms.   Wash your hands often if contact occurs.  There is no clear evidence that vitamin C, vitamin E, echinacea, or exercise reduces the chance of developing a cold. However, it is always recommended to get plenty of rest, exercise, and practice good nutrition.  SEEK MEDICAL CARE IF:   You are getting worse rather than better.   Your symptoms are not controlled by medicine.   You have chills.  You have worsening shortness of breath.  You have brown or red mucus.  You have yellow or brown nasal  discharge.  You have pain in your face, especially when you bend forward.  You have a fever.  You have swollen neck glands.  You have pain while swallowing.  You have white areas in the back of your throat. SEEK IMMEDIATE MEDICAL CARE IF:   You have severe or persistent:  Headache.  Ear pain.  Sinus pain.  Chest pain.  You have chronic lung disease and any of the following:  Wheezing.  Prolonged cough.  Coughing up blood.  A change in your usual mucus.  You have a stiff neck.  You have changes in your:  Vision.  Hearing.  Thinking.  Mood. MAKE SURE YOU:   Understand these instructions.  Will watch your condition.  Will get help right away if you are not doing well or get worse.   This information is not intended to replace advice given to you by your health care provider. Make sure you discuss any questions you have with your health care provider.   Document Released: 09/13/2000 Document Revised: 08/04/2014 Document Reviewed: 06/25/2013 Elsevier Interactive Patient Education 2016 [employer] Inc[employer]  

## 2015-01-25 NOTE — Telephone Encounter (Signed)
Faxed a note back to [facility] stating that I was not sure which form she is referring to.

## 2015-01-26 NOTE — Telephone Encounter (Signed)
Pt was seen in ED on 10/22 and was in [place].  Diagnosed with viral URI

## 2015-02-03 ENCOUNTER — Encounter: Payer: Self-pay | Admitting: *Deleted

## 2015-02-04 NOTE — Telephone Encounter (Signed)
I asked Dr. Caryl Comes (EP) to review strips from 10/20 & 10/21- per Dr. Caryl Comes- "Suspect all sinus."

## 2015-03-04 ENCOUNTER — Telehealth: Payer: Self-pay | Admitting: Medical

## 2015-03-04 ENCOUNTER — Ambulatory Visit: Payer: BLUE CROSS/BLUE SHIELD | Admitting: Neurology

## 2015-03-04 ENCOUNTER — Encounter: Payer: BLUE CROSS/BLUE SHIELD | Admitting: Medical

## 2015-03-04 ENCOUNTER — Telehealth: Payer: Self-pay

## 2015-03-04 NOTE — Telephone Encounter (Signed)
C.  I don't think she is bad about no shows, but she also had stroke not too long ago.   So call and check on her.  If she simply forgot, she needs to pay the no show fee.  Otherwise make sure she is OK.

## 2015-03-04 NOTE — Telephone Encounter (Signed)
This patient no showed for their appointment today.Which of the following is necessary for this patient.   A) No follow-up necessary   B) Follow-up urgent. Locate Patient Immediately.   C) Follow-up necessary. Contact patient and Schedule visit in ____ Days.   D) Follow-up Advised. Contact patient and Schedule visit in ____ Days. 

## 2015-03-04 NOTE — Telephone Encounter (Signed)
Called pt regarding no show & Left message for pt

## 2015-03-05 ENCOUNTER — Encounter: Payer: Self-pay | Admitting: Medical

## 2015-03-05 NOTE — Telephone Encounter (Signed)
Called pt and left message for her to call the office back.

## 2015-03-14 ENCOUNTER — Other Ambulatory Visit: Payer: Self-pay | Admitting: Medical

## 2015-03-22 ENCOUNTER — Encounter: Payer: Self-pay | Admitting: Medical

## 2015-03-22 ENCOUNTER — Ambulatory Visit (INDEPENDENT_AMBULATORY_CARE_PROVIDER_SITE_OTHER): Payer: BLUE CROSS/BLUE SHIELD | Admitting: Medical

## 2015-03-22 ENCOUNTER — Other Ambulatory Visit (HOSPITAL_COMMUNITY)
Admission: RE | Admit: 2015-03-22 | Discharge: 2015-03-22 | Disposition: A | Discharge status: Home / Self Care | Payer: BLUE CROSS/BLUE SHIELD | Source: Ambulatory Visit | Attending: Medical | Admitting: Medical

## 2015-03-22 ENCOUNTER — Telehealth: Payer: Self-pay | Admitting: Medical

## 2015-03-22 VITALS — BP 110/70 | HR 97 | Ht 67.25 in | Wt 214.0 lb

## 2015-03-22 DIAGNOSIS — G8929 Other chronic pain: Secondary | ICD-10-CM

## 2015-03-22 DIAGNOSIS — R4189 Other symptoms and signs involving cognitive functions and awareness: Secondary | ICD-10-CM

## 2015-03-22 DIAGNOSIS — Z124 Encounter for screening for malignant neoplasm of cervix: Secondary | ICD-10-CM | POA: Diagnosis not present

## 2015-03-22 DIAGNOSIS — K219 Gastro-esophageal reflux disease without esophagitis: Secondary | ICD-10-CM | POA: Diagnosis not present

## 2015-03-22 DIAGNOSIS — E785 Hyperlipidemia, unspecified: Secondary | ICD-10-CM

## 2015-03-22 DIAGNOSIS — R52 Pain, unspecified: Secondary | ICD-10-CM

## 2015-03-22 DIAGNOSIS — R209 Unspecified disturbances of skin sensation: Secondary | ICD-10-CM

## 2015-03-22 DIAGNOSIS — R102 Pelvic and perineal pain unspecified side: Secondary | ICD-10-CM

## 2015-03-22 DIAGNOSIS — N852 Hypertrophy of uterus: Secondary | ICD-10-CM

## 2015-03-22 DIAGNOSIS — R42 Dizziness and giddiness: Secondary | ICD-10-CM

## 2015-03-22 DIAGNOSIS — N949 Unspecified condition associated with female genital organs and menstrual cycle: Secondary | ICD-10-CM | POA: Diagnosis not present

## 2015-03-22 DIAGNOSIS — Z8742 Personal history of other diseases of the female genital tract: Secondary | ICD-10-CM

## 2015-03-22 DIAGNOSIS — E559 Vitamin D deficiency, unspecified: Secondary | ICD-10-CM

## 2015-03-22 DIAGNOSIS — Z01419 Encounter for gynecological examination (general) (routine) without abnormal findings: Secondary | ICD-10-CM | POA: Insufficient documentation

## 2015-03-22 DIAGNOSIS — I10 Essential (primary) hypertension: Secondary | ICD-10-CM

## 2015-03-22 DIAGNOSIS — Z86018 Personal history of other benign neoplasm: Secondary | ICD-10-CM | POA: Insufficient documentation

## 2015-03-22 DIAGNOSIS — C49A0 Gastrointestinal stromal tumor, unspecified site: Secondary | ICD-10-CM | POA: Insufficient documentation

## 2015-03-22 DIAGNOSIS — M79604 Pain in right leg: Secondary | ICD-10-CM

## 2015-03-22 DIAGNOSIS — R109 Unspecified abdominal pain: Secondary | ICD-10-CM

## 2015-03-22 DIAGNOSIS — Z0000 Encounter for general adult medical examination without abnormal findings: Secondary | ICD-10-CM | POA: Diagnosis not present

## 2015-03-22 DIAGNOSIS — R202 Paresthesia of skin: Secondary | ICD-10-CM

## 2015-03-22 LAB — POCT URINALYSIS DIPSTICK
Bilirubin, UA: NEGATIVE
Blood, UA: NEGATIVE
Glucose, UA: NEGATIVE
Ketones, UA: NEGATIVE
Leukocytes, UA: NEGATIVE
Nitrite, UA: NEGATIVE
Protein, UA: NEGATIVE
Spec Grav, UA: 1.02
Urobilinogen, UA: 1
pH, UA: 7

## 2015-03-22 LAB — CBC
HCT: 39.5 % (ref 36.0–46.0)
Hemoglobin: 12.5 g/dL (ref 12.0–15.0)
MCH: 26.9 pg (ref 26.0–34.0)
MCHC: 31.6 g/dL (ref 30.0–36.0)
MCV: 84.9 fL (ref 78.0–100.0)
MPV: 9.3 fL (ref 8.6–12.4)
Platelets: 361 10*3/uL (ref 150–400)
RBC: 4.65 MIL/uL (ref 3.87–5.11)
RDW: 13.9 % (ref 11.5–15.5)
WBC: 5.9 10*3/uL (ref 4.0–10.5)

## 2015-03-22 LAB — HEMOGLOBIN A1C
Hgb A1c MFr Bld: 6.1 % — ABNORMAL HIGH (ref <5.7)
Mean Plasma Glucose: 128 mg/dL — ABNORMAL HIGH (ref <117)

## 2015-03-22 NOTE — Progress Notes (Signed)
Subjective:   HPI  Lisa Briggs is a 54 y.o. female who presents for a complete physical.  Medical care team includes:  Dr. [name], [facility]  Dr. Owens Loffler, GI, prior with Dr. Benson Norway, dismissed from their practice  Has seen gynecology in the last few years at my request given chronic pelvic pain, prior requests for [facility] for vague complaints  Podiatry.  Crisoforo Oxford, PA-C here for primary care Dr. Einar Gip, eye doctor Sees dentist  Reviewed their medical, surgical, family, social, medication, and allergy history and updated chart as appropriate.  Past Medical History  Diagnosis Date  . Hypertension   . GERD (gastroesophageal reflux disease)   . Allergy   . Farsightedness     wears glasses, Eye care center  . History of uterine fibroid   . Chronic back pain   . Polyarthralgia     normal rheumatoid screen 01/2012  . Constipation   . Chest pain 04/05/2011    cardiac eval, normal treadmill stress test, Dr. Tollie Eth  . Anemia     iron therapy for years as of 10/12; normal Hgb 08/2013  . Hyperlipidemia   . Arthritis   . 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 [name], [facility]  . Paresthesia 09/2014    initially thought to be TIA, neurology consult in 12/2014 with other non TIA considerations.      Past Surgical History  Procedure Laterality Date  . Lipoma excision      forehead  . Knee arthroscopy Left   . Uterine fibroid surgery    . Gall stone surgery    . Colonoscopy  01/2014    diverticulosis, othwerise normal - Dr. Owens Loffler  . Esophagogastroduodenoscopy  2013    Dr. Benson Norway, gastritis  . Esophagogastroduodenoscopy  [id number]  . Eus N/A 04/16/2014    Procedure: UPPER ENDOSCOPIC ULTRASOUND (EUS) LINEAR;  Surgeon: Milus Banister, MD;  Location: [facility];  Service: Endoscopy;  Laterality: N/A;  . Laparoscopic gastric resection N/A 06/09/2014    Procedure: LAPAROSCOPIC GASTRIC MASS  RESECTION;  Surgeon: [name], MD;  Location: [facility] ORS;  Service: General;  Laterality: N/A;    Social History   Social History  . Marital Status: Married    Spouse Name: N/A  . Number of Children: 1  . Years of Education: N/A   Occupational History  . Glass blower/designer    Social History Main Topics  . Smoking status: Never Smoker   . Smokeless tobacco: Never Used  . Alcohol Use: No  . Drug Use: No  . Sexual Activity: Not on file   Other Topics Concern  . Not on file   Social History Narrative   Married, has 1 son in [place] and some grandchildren.  Glass blower/designer.  Active on job.  Does stretching and exercises daily as per physical therapy.  Works 12 hours daily.      Family History  Problem Relation Age of Onset  . Hypertension Mother   . Arthritis Mother   . GER disease Mother   . Glaucoma Mother   . Stroke Father   . Alcohol abuse Father   . Breast cancer Sister     breast cancer dx late 80s; another sister with leukemia  . Hypertension Sister   . Heart disease Sister     heart murmur; other sisters with heart problems  . Diabetes Brother   . Diabetes Maternal Aunt   . Heart disease Maternal Aunt   .  Heart disease Maternal Grandmother   . Heart disease Maternal Grandfather   . Heart disease Maternal Uncle   . Stroke Maternal Uncle   . Colon cancer Brother 51  . Colon polyps Neg Hx      Current outpatient prescriptions:  .  aspirin EC 81 MG tablet, Take 1 tablet (81 mg total) by mouth daily., Disp: 30 tablet, Rfl: 3 .  cyclobenzaprine (FLEXERIL) 10 MG tablet, Take 1 tablet (10 mg total) by mouth at bedtime., Disp: 30 tablet, Rfl: 0 .  dexlansoprazole (DEXILANT) 60 MG capsule, Take 1 capsule (60 mg total) by mouth daily., Disp: 90 capsule, Rfl: 1 .  famotidine (PEPCID) 20 MG tablet, Take 1 tablet (20 mg total) by mouth at bedtime., Disp: 90 tablet, Rfl: 1 .  ferrous fumarate (HEMOCYTE - 106 MG FE) 325 (106 FE) MG TABS tablet, Take 1 tablet (106 mg of  iron total) by mouth daily., Disp: 90 tablet, Rfl: 0 .  fluticasone (FLONASE) 50 MCG/ACT nasal spray, Place 1 spray into both nostrils daily as needed for allergies or rhinitis., Disp: 16 g, Rfl: 11 .  gabapentin (NEURONTIN) 300 MG capsule, Take 1 capsule (300 mg total) by mouth at bedtime., Disp: 30 capsule, Rfl: 5 .  loratadine (CLARITIN) 10 MG tablet, Take 1 tablet (10 mg total) by mouth daily., Disp: 30 tablet, Rfl: 0 .  losartan-hydrochlorothiazide (HYZAAR) 50-12.5 MG per tablet, Take 1 tablet by mouth daily., Disp: 90 tablet, Rfl: 3 .  meloxicam (MOBIC) 15 MG tablet, Take 1 tablet (15 mg total) by mouth daily., Disp: 90 tablet, Rfl: 0 .  polyethylene glycol powder (GLYCOLAX/MIRALAX) powder, TAKE ONE CAPFUL (255 GRAMS) BY MOUTH DAILY, Disp: 527 g, Rfl: 5 .  potassium chloride (K-DUR,KLOR-CON) 10 MEQ tablet, Take 1 tablet (10 mEq total) by mouth daily., Disp: 90 tablet, Rfl: 3 .  simvastatin (ZOCOR) 20 MG tablet, Take 1 tablet (20 mg total) by mouth daily., Disp: 90 tablet, Rfl: 3 .  benzonatate (TESSALON) 200 MG capsule, Take 1 capsule (200 mg total) by mouth 2 (two) times daily as needed for cough. (Patient not taking: Reported on 03/22/2015), Disp: 20 capsule, Rfl: 0 .  ondansetron (ZOFRAN) 4 MG tablet, Take 1 tablet (4 mg total) by mouth every 8 (eight) hours as needed for nausea or vomiting. (Patient not taking: Reported on 03/22/2015), Disp: 20 tablet, Rfl: 2 .  traMADol (ULTRAM) 50 MG tablet, Take 1 tablet (50 mg total) by mouth every 6 (six) hours as needed for moderate pain. (Patient not taking: Reported on 03/22/2015), Disp: 30 tablet, Rfl: 0 .  Vitamin D, Ergocalciferol, (DRISDOL) 50000 UNITS CAPS capsule, Take 1 capsule (50,000 Units total) by mouth every 7 (seven) days. (Patient not taking: Reported on 03/22/2015), Disp: 4.5 capsule, Rfl: 3  Allergies  Allergen Reactions  . Mucinex Dm [Dm-Guaifenesin Er]     Messes up her stomach. Pt report on 01/21/15    Review of  Systems Constitutional: -fever, -chills, -sweats, -unexpected weight change, -decreased appetite, -fatigue Allergy: -sneezing, -itching, -congestion Dermatology: -changing moles, --rash, -lumps ENT: -runny nose, -ear pain, -sore throat, -hoarseness, -sinus pain, -teeth pain, - ringing in ears, -hearing loss, -nosebleeds Cardiology: -chest pain, -palpitations, -swelling, -difficulty breathing when lying flat, -waking up short of breath Respiratory: -cough, -shortness of breath, -difficulty breathing with exercise or exertion, -wheezing, -coughing up blood Gastroenterology: -abdominal pain, -nausea, -vomiting, -diarrhea, -constipation, -blood in stool, -changes in bowel movement, -difficulty swallowing or eating Hematology: -bleeding, -bruising  Musculoskeletal: -joint aches, -muscle aches, -joint  swelling, -back pain, -neck pain, -cramping, -changes in gait Ophthalmology: denies vision changes, eye redness, itching, discharge Urology: -burning with urination, -difficulty urinating, -blood in urine, -urinary frequency, -urgency, -incontinence Neurology: -headache, -weakness, -tingling, +numbness, -memory loss, -falls, -dizziness Psychology: -depressed mood, -agitation, -sleep problems     Objective:   Physical Exam  BP 110/70 mmHg  Pulse 97  Ht 5' 7.25" (1.708 m)  Wt 214 lb (97.07 kg)  BMI 33.27 kg/m2  LMP 02/23/2015  General appearance: alert, no distress, WD/WN, AA female Skin: right lower abdomen, inferior to umbilicus with 49mm x 65mm brown flat macules, somewhat triangular shaped, but well defined, uniform color. Few other scattered benign appearing macules. No other worrisome lesions HEENT: normocephalic, conjunctiva/corneas normal, sclerae anicteric, PERRLA, EOMi, nares patent, no discharge or erythema, pharynx normal Oral cavity: MMM, tongue normal, teeth in good repair Neck: supple, no lymphadenopathy, no thyromegaly, no masses, normal ROM, no bruits Chest: non tender, normal  shape and expansion Heart: RRR, normal S1, S2, no murmurs Lungs: CTA bilaterally, no wheezes, rhonchi, or rales Abdomen: +bs, soft, surgical port scars of right upper and right mid abdomen, umbilicus, mild lower abdominal tenderness, otherwise non tender, non distended, no masses, no hepatomegaly, no splenomegaly, no bruits Back: back non tender, normal ROM, no scoliosis Musculoskeletal: right leg with hip tenderness and tender over right trochanter bursa area, but otherwise nontender, normal ROM, otherwise Extremities: no edema, no cyanosis, no clubbing Pulses: 2+ symmetric, upper and lower extremities, normal cap refill Neurological: alert, oriented x 3, CN2-12 intact, strength normal upper extremities and lower extremities, sensation normal throughout, DTRs 2+ throughout, no cerebellar signs, gait normal Psychiatric: normal affect, behavior normal, pleasant  Breast: nontender, no masses or lumps, no skin changes, no nipple discharge or inversion, no axillary lymphadenopathy Gyn: Normal external genitalia without lesions, vagina with normal mucosa, cervix without lesions, no cervical motion tenderness, no abnormal vaginal discharge. Uterus with tenderness and seems enlarged, otherwise adnexa not enlarged, no masses.  chaperoned by nurse. Rectal: deferred   Assessment and Plan :    Encounter Diagnoses  Name Primary?  . Encounter for health maintenance examination in adult Yes  . Essential hypertension   . Chronic abdominal pain   . Gastroesophageal reflux disease without esophagitis   . Hyperlipidemia   . Tenderness of female pelvic organs   . Facial paresthesia   . Dizziness and giddiness   . Cognitive decline   . Right leg pain   . Screening for cervical cancer   . Vitamin D deficiency   . History of uterine leiomyoma   . Gastrointestinal stromal tumor (GIST)    Physical exam - discussed healthy lifestyle, diet, exercise, preventative care, vaccinations, and addressed their  concerns.  Handout given. See your eye doctor yearly for routine vision care. See your dentist yearly for routine dental care including hygiene visits twice yearly. Routine labs today Completed her handicap placard today for legs pains, paresthesias.  F/u with neurology this month as planned for paresthesias, prior possible TIA in 09/2014, EEG  Reviewed 06/2014 GI records from GIST tumor resection Right leg pain - f/u with ortho as planned GERD - c/t same medication, discussed long term risks of PPIs, seems to be fairly well controlled at current HTN - controlled on current medication Hx/o hyperlipidemia - f/u pending labs. Pelvic tenderness and fullness of uterus - will refer for pelvic ultrasound Allergic rhinitis - c/t current medication Follow-up pending labs

## 2015-03-22 NOTE — Telephone Encounter (Signed)
Set up for pelvic ultrasound given abnormality on exam today, enlarged uterus, tenderness in same area

## 2015-03-23 ENCOUNTER — Telehealth: Payer: Self-pay

## 2015-03-23 ENCOUNTER — Other Ambulatory Visit: Payer: Self-pay | Admitting: Medical

## 2015-03-23 DIAGNOSIS — R110 Nausea: Secondary | ICD-10-CM

## 2015-03-23 DIAGNOSIS — K2970 Gastritis, unspecified, without bleeding: Secondary | ICD-10-CM

## 2015-03-23 LAB — LIPID PANEL
Cholesterol: 154 mg/dL (ref 125–200)
HDL: 76 mg/dL (ref >=46)
LDL Cholesterol: 68 mg/dL (ref <130)
Total CHOL/HDL Ratio: 2 Ratio (ref <=5.0)
Triglycerides: 48 mg/dL (ref <150)
VLDL: 10 mg/dL (ref <30)

## 2015-03-23 LAB — COMPREHENSIVE METABOLIC PANEL
ALT: 30 U/L — ABNORMAL HIGH (ref 6–29)
AST: 24 U/L (ref 10–35)
Albumin: 4.1 g/dL (ref 3.6–5.1)
Alkaline Phosphatase: 108 U/L (ref 33–130)
BUN: 15 mg/dL (ref 7–25)
CO2: 31 mmol/L (ref 20–31)
Calcium: 9.5 mg/dL (ref 8.6–10.4)
Chloride: 102 mmol/L (ref 98–110)
Creat: 0.68 mg/dL (ref 0.50–1.05)
Glucose, Bld: 76 mg/dL (ref 65–99)
Potassium: 3.8 mmol/L (ref 3.5–5.3)
Sodium: 140 mmol/L (ref 135–146)
Total Bilirubin: 0.4 mg/dL (ref 0.2–1.2)
Total Protein: 7.7 g/dL (ref 6.1–8.1)

## 2015-03-23 LAB — IRON AND TIBC
%SAT: 25 % (ref 11–50)
Iron: 67 ug/dL (ref 45–160)
TIBC: 271 ug/dL (ref 250–450)
UIBC: 204 ug/dL (ref 125–400)

## 2015-03-23 LAB — TSH: TSH: 0.778 u[IU]/mL (ref 0.350–4.500)

## 2015-03-23 LAB — VITAMIN D 25 HYDROXY (VIT D DEFICIENCY, FRACTURES): Vit D, 25-Hydroxy: 32 ng/mL (ref 30–100)

## 2015-03-23 MED ORDER — DEXLANSOPRAZOLE 60 MG PO CPDR: 60.0000 mg | DELAYED_RELEASE_CAPSULE | Freq: Every day | ORAL | Status: DC

## 2015-03-23 MED ORDER — ASPIRIN EC 81 MG PO TBEC: 81.0000 mg | DELAYED_RELEASE_TABLET | Freq: Every day | ORAL | Status: DC

## 2015-03-23 MED ORDER — POLYETHYLENE GLYCOL 3350 17 GM/SCOOP PO POWD: ORAL | Status: DC

## 2015-03-23 MED ORDER — POTASSIUM CHLORIDE CRYS ER 10 MEQ PO TBCR: 10.0000 meq | EXTENDED_RELEASE_TABLET | Freq: Every day | ORAL | Status: DC

## 2015-03-23 MED ORDER — LOSARTAN POTASSIUM-HCTZ 50-12.5 MG PO TABS: 1.0000 | ORAL_TABLET | Freq: Every day | ORAL | Status: DC

## 2015-03-23 MED ORDER — GABAPENTIN 300 MG PO CAPS: 300.0000 mg | ORAL_CAPSULE | Freq: Every day | ORAL | Status: DC

## 2015-03-23 MED ORDER — VITAMIN D (ERGOCALCIFEROL) 1.25 MG (50000 UNIT) PO CAPS
50000.0000 [IU] | ORAL_CAPSULE | ORAL | Status: DC
Start: 2015-03-23 — End: 2016-01-13

## 2015-03-23 MED ORDER — SIMVASTATIN 20 MG PO TABS: 20.0000 mg | ORAL_TABLET | Freq: Every day | ORAL | Status: DC

## 2015-03-23 MED ORDER — FAMOTIDINE 20 MG PO TABS: 20.0000 mg | ORAL_TABLET | Freq: Every day | ORAL | Status: DC

## 2015-03-23 MED ORDER — FERROUS FUMARATE 325 (106 FE) MG PO TABS: 1.0000 | ORAL_TABLET | Freq: Every day | ORAL | Status: DC

## 2015-03-23 NOTE — Telephone Encounter (Signed)
[facility] called because on the instructions for miralax powder said take one capful 255 grams, he said one capful is only 17 grams and if she took 255 grams a dosing each container would only have two doses verses it being a 30 day supply when taking 17 grams and wanted to make you aware for the future orders

## 2015-03-23 NOTE — Telephone Encounter (Signed)
done

## 2015-03-23 NOTE — Telephone Encounter (Signed)
[place] set up for 03/25/15 at 145pm she has to fill her bladder with water one hour prior

## 2015-03-23 NOTE — Telephone Encounter (Signed)
Let them know 1 cap full daily shane

## 2015-03-24 LAB — CYTOLOGY - PAP

## 2015-03-25 ENCOUNTER — Other Ambulatory Visit: Payer: BLUE CROSS/BLUE SHIELD

## 2015-03-26 ENCOUNTER — Ambulatory Visit
Admission: RE | Admit: 2015-03-26 | Discharge: 2015-03-26 | Disposition: A | Discharge status: Home / Self Care | Payer: BLUE CROSS/BLUE SHIELD | Source: Ambulatory Visit | Attending: Medical | Admitting: Medical

## 2015-03-30 ENCOUNTER — Ambulatory Visit: Payer: BLUE CROSS/BLUE SHIELD | Admitting: Neurology

## 2015-04-06 ENCOUNTER — Telehealth: Payer: Self-pay | Admitting: Medical

## 2015-04-06 NOTE — Telephone Encounter (Signed)
Pt called to let Loma Sousa know that she tried to call the obgyn office to make an appt but they told her thaat our office will need to send something (records or something) to their office first before pt will be able to make an appt. Let pt know when info sent over so that she will know that she can call back to make an appt.

## 2015-04-07 NOTE — Telephone Encounter (Signed)
Faxed records over and called pt and left it on the voicemail that she can call to schedule appt

## 2015-04-12 ENCOUNTER — Ambulatory Visit
Admission: RE | Admit: 2015-04-12 | Discharge: 2015-04-12 | Disposition: A | Discharge status: Home / Self Care | Payer: BLUE CROSS/BLUE SHIELD | Source: Ambulatory Visit | Attending: Medical | Admitting: Medical

## 2015-04-12 DIAGNOSIS — Z1231 Encounter for screening mammogram for malignant neoplasm of breast: Secondary | ICD-10-CM

## 2015-04-13 ENCOUNTER — Telehealth: Payer: Self-pay

## 2015-04-13 NOTE — Telephone Encounter (Signed)
Lisa Briggs at [facility] called to discuss pts ultrasound before the schedule her, called back and left message with my direct extension for her to call back. Her number is [phone] ext [phone]

## 2015-04-29 ENCOUNTER — Encounter: Payer: Self-pay | Admitting: Neurology

## 2015-04-29 ENCOUNTER — Ambulatory Visit (INDEPENDENT_AMBULATORY_CARE_PROVIDER_SITE_OTHER): Payer: BLUE CROSS/BLUE SHIELD | Admitting: Neurology

## 2015-04-29 VITALS — BP 110/82 | HR 76 | Ht 69.0 in | Wt 223.0 lb

## 2015-04-29 DIAGNOSIS — M5416 Radiculopathy, lumbar region: Secondary | ICD-10-CM | POA: Diagnosis not present

## 2015-04-29 MED ORDER — GABAPENTIN 300 MG PO CAPS: 300.0000 mg | ORAL_CAPSULE | Freq: Every day | ORAL | Status: DC

## 2015-04-29 NOTE — Progress Notes (Signed)
Follow-up Visit   Date: 04/29/2015    Lisa Briggs MRN: [id number] DOB: [birth year]   Interim History: Lisa Briggs is a 55 y.o.  right-handed African American female with hypertension, GERD, polyarthralgia, hyperlipidemia returning to the clinic for follow-up of right leg pain, facial paresthesias, and possible TIA.  The patient was accompanied to the clinic by self.  History of present illness: Patient reports having a constellation of neurological symptoms which first began in January 2016. She experienced tingling of the face and mouth with right leg heaviness. She went to the emergency room and was found to have low potassium. Symptoms imprved spontaneously. Again, several months later, she developed the same symptoms but this time with bilateral hand paresthesias. Potassium was low again. Most recently, in June she had another spell of facial paresthesias, bilateral leg pain, but this time it was associated with left arm weakness and word-finding difficulty. She was admitted overnight for stroke work-up which returned negative. MRI/A brain, US carotids, and echo was normal.  She also complains of bilateral shooting leg pain, worse on the right that travels down her lateral thigh and lower leg. MRI lumbar spine from 2014 shows multilevel disc disease, as well as mild foraminal encroachment bilaterally at L2-3 and L3-4 as well as broad based right foraminal disc protrusion at L5-S1.  UPDATE 04/29/2015:  She has noticed improvement of her leg pain with gabapentin 300mg  at bedtime, her pain only occurs every few weeks now.  She has not had any further spells of facial paresthesias.  No new headaches.  She is being evaluated for new onset vaginal bleeding by her OBGYN and is tearful about this because of concern of uterine cancer.    Medications:  Current Outpatient Prescriptions on File Prior to Visit  Medication Sig Dispense Refill  . aspirin EC 81 MG tablet Take 1 tablet (81 mg  total) by mouth daily. 90 tablet 3  . dexlansoprazole (DEXILANT) 60 MG capsule Take 1 capsule (60 mg total) by mouth daily. 90 capsule 3  . famotidine (PEPCID) 20 MG tablet Take 1 tablet (20 mg total) by mouth at bedtime. 90 tablet 3  . ferrous fumarate (HEMOCYTE - 106 MG FE) 325 (106 FE) MG TABS tablet Take 1 tablet (106 mg of iron total) by mouth daily. 90 tablet 1  . fluticasone (FLONASE) 50 MCG/ACT nasal spray Place 1 spray into both nostrils daily as needed for allergies or rhinitis. 16 g 11  . loratadine (CLARITIN) 10 MG tablet Take 1 tablet (10 mg total) by mouth daily. 30 tablet 0  . losartan-hydrochlorothiazide (HYZAAR) 50-12.5 MG tablet Take 1 tablet by mouth daily. 90 tablet 3  . polyethylene glycol powder (GLYCOLAX/MIRALAX) powder TAKE ONE CAPFUL (255 GRAMS) BY MOUTH DAILY 527 g 5  . potassium chloride (K-DUR,KLOR-CON) 10 MEQ tablet Take 1 tablet (10 mEq total) by mouth daily. 90 tablet 3  . simvastatin (ZOCOR) 20 MG tablet Take 1 tablet (20 mg total) by mouth daily. 90 tablet 3  . Vitamin D, Ergocalciferol, (DRISDOL) 50000 UNITS CAPS capsule Take 1 capsule (50,000 Units total) by mouth every 7 (seven) days. 4.5 capsule 5   No current facility-administered medications on file prior to visit.    Allergies:  Allergies  Allergen Reactions  . Mucinex Dm [Dm-Guaifenesin Er]     Messes up her stomach. Pt report on 01/21/15    Review of Systems:  CONSTITUTIONAL: No fevers, chills, night sweats, or weight loss.  EYES: No visual changes or eye pain  ENT: No hearing changes.  No history of nose bleeds.   RESPIRATORY: No cough, wheezing and shortness of breath.   CARDIOVASCULAR: Negative for chest pain, and palpitations.   GI: Negative for abdominal discomfort, blood in stools or black stools.  No recent change in bowel habits.   GU:  No history of incontinence.   MUSCLOSKELETAL: No history of joint pain or swelling.  No myalgias.   SKIN: Negative for lesions, rash, and itching.     ENDOCRINE: Negative for cold or heat intolerance, polydipsia or goiter.   PSYCH:  + depression or anxiety symptoms.   NEURO: As Above.   Vital Signs:  BP 110/82 mmHg  Pulse 76  Ht 5\' 9"  (1.753 m)  Wt 223 lb (101.152 kg)  BMI 32.92 kg/m2  SpO2 97%  LMP 02/23/2015  Neurological Exam: MENTAL STATUS including orientation to time, place, person, recent and remote memory, attention span and concentration, language, and fund of knowledge is normal.  Speech is not dysarthric.  CRANIAL NERVES:  Pupils equal round and reactive to light.  Normal conjugate, extra-ocular eye movements in all directions of gaze.  No ptosis. Normal facial sensation.  Face is symmetric.   MOTOR:  Motor strength is 5/5 in all extremities.  No atrophy, fasciculations or abnormal movements.  No pronator drift.  Tone is normal.    MSRs:  Reflexes are 2+/4 throughout.  SENSORY:  Intact to vibration.  COORDINATION/GAIT:  Normal finger-to- nose-finger and heel-to-shin.  Intact rapid alternating movements bilaterally.  Gait mildly wide based due to body habitus.    Data: EEG 12/21/2014:  Normal 30-day cardiac monitor:  Normal  MRI/A brain 09/29/2014: 1. Scattered periventricular and subcortical T2 hyperintensities are slightly greater than expected for age. The finding is nonspecific but can be seen in the setting of chronic microvascular ischemia, a demyelinating process such as multiple sclerosis, vasculitis, complicated migraine headaches, or as the sequelae of a prior infectious or inflammatory process. 2. No acute abnormality. No evidence for acute or subacute infarct. 3. Normal variant MRA circle of Willis without evidence for significant proximal stenosis, aneurysm, or branch vessel occlusion.  Echo 09/30/2014: - Normal EF, Moderate TR, mild MR, otherwise normal study.  US carotids 09/29/2014: 1-39% bilateral ICA stenosis.   MRI lumbar spine wo contrast 05/07/2014:  1. Multilevel disc disease and facet  disease. 2. Multilevel lateral recess encroachment. 3. Mild bilateral foraminal encroachment at L2-3 and L3-4. 4. Mild left foraminal stenosis at L4-5. 5. Shallow broad-based right foraminal disc protrusion at L5-S1.  Marland Kitchen IMPRESSION/PLAN: Ms. Quilty is a 55 year-old female returning for evaluation of various spells of neurological events involving bifacial and mouth paresthesias, bilateral hand paresthesias, and bilateral leg pain. In June 2016, her spell was considered to be a TIA due to predominately left sided weakness, but it is unusual to have bifacial paresthesias. She does endorse having left sided headaches with these events, so I question whether she is really experiencing a migraine variant. Stroke work-up including MRI/A brain, US carotids, echo, EEG, and cardiac monitoring was normal.   Fortunately, she has not had any further spells of facial paresthesias.  If these reoccur, consider treatment for migraine variant.  Her right leg pain has improved with gabapentin and she only has radicular pain every few weeks.  She may certainly take extra gabapentin to 300mg  twice daily for the spells of severe pain, but otherwise continue 300mg  at bedtime.  Refills provided.  If symptoms worsen, repeat imaging of the lumbar spine as  she has known degenerative changes, multilevel foraminal stenosis, and disc protrusion at L5-S1 on the right which could potentially be getting worse.  Return to clinic as needed.    The duration of this appointment visit was 25 minutes of face-to-face time with the patient.  Greater than 50% of this time was spent in counseling, explanation of diagnosis, planning of further management, and coordination of care.   Thank you for allowing me to participate in patient's care.  If I can answer any additional questions, I would be pleased to do so.    Sincerely,    Mende Biswell K. Posey Pronto, DO

## 2015-04-29 NOTE — Patient Instructions (Signed)
Continue gabapentin 300mg  at bedtime.  If your pain worsens, you can take 1 tablet in the morning and bedtime. Return to clinic as needed

## 2015-05-17 ENCOUNTER — Encounter (HOSPITAL_COMMUNITY): Payer: Self-pay | Admitting: Emergency Medicine

## 2015-05-17 ENCOUNTER — Encounter: Payer: Self-pay | Admitting: Family Medicine

## 2015-05-17 ENCOUNTER — Emergency Department (HOSPITAL_COMMUNITY): Payer: BLUE CROSS/BLUE SHIELD

## 2015-05-17 ENCOUNTER — Inpatient Hospital Stay (HOSPITAL_COMMUNITY)
Admission: EM | Admit: 2015-05-17 | Discharge: 2015-05-17 | DRG: 153 | Disposition: A | Discharge status: Home / Self Care | Payer: BLUE CROSS/BLUE SHIELD | Attending: Emergency Medicine | Admitting: Emergency Medicine

## 2015-05-17 ENCOUNTER — Ambulatory Visit (INDEPENDENT_AMBULATORY_CARE_PROVIDER_SITE_OTHER): Payer: BLUE CROSS/BLUE SHIELD | Admitting: Family Medicine

## 2015-05-17 VITALS — BP 124/78 | HR 68 | Temp 100.3°F | Resp 16

## 2015-05-17 DIAGNOSIS — M549 Dorsalgia, unspecified: Secondary | ICD-10-CM | POA: Diagnosis present

## 2015-05-17 DIAGNOSIS — J101 Influenza due to other identified influenza virus with other respiratory manifestations: Secondary | ICD-10-CM

## 2015-05-17 DIAGNOSIS — I10 Essential (primary) hypertension: Secondary | ICD-10-CM | POA: Diagnosis present

## 2015-05-17 DIAGNOSIS — J111 Influenza due to unidentified influenza virus with other respiratory manifestations: Principal | ICD-10-CM | POA: Diagnosis present

## 2015-05-17 DIAGNOSIS — G8929 Other chronic pain: Secondary | ICD-10-CM | POA: Diagnosis present

## 2015-05-17 DIAGNOSIS — R110 Nausea: Secondary | ICD-10-CM | POA: Diagnosis not present

## 2015-05-17 DIAGNOSIS — E785 Hyperlipidemia, unspecified: Secondary | ICD-10-CM | POA: Diagnosis present

## 2015-05-17 DIAGNOSIS — Z7982 Long term (current) use of aspirin: Secondary | ICD-10-CM

## 2015-05-17 DIAGNOSIS — M1990 Unspecified osteoarthritis, unspecified site: Secondary | ICD-10-CM | POA: Diagnosis present

## 2015-05-17 DIAGNOSIS — K922 Gastrointestinal hemorrhage, unspecified: Secondary | ICD-10-CM | POA: Diagnosis present

## 2015-05-17 DIAGNOSIS — R05 Cough: Secondary | ICD-10-CM | POA: Diagnosis not present

## 2015-05-17 DIAGNOSIS — R6889 Other general symptoms and signs: Secondary | ICD-10-CM

## 2015-05-17 DIAGNOSIS — Z79899 Other long term (current) drug therapy: Secondary | ICD-10-CM

## 2015-05-17 DIAGNOSIS — K219 Gastro-esophageal reflux disease without esophagitis: Secondary | ICD-10-CM | POA: Diagnosis present

## 2015-05-17 LAB — I-STAT CG4 LACTIC ACID, ED
Lactic Acid, Venous: 0.53 mmol/L (ref 0.5–2.0)
Lactic Acid, Venous: 0.55 mmol/L (ref 0.5–2.0)

## 2015-05-17 LAB — COMPREHENSIVE METABOLIC PANEL
ALT: 21 U/L (ref 14–54)
AST: 24 U/L (ref 15–41)
Albumin: 3.5 g/dL (ref 3.5–5.0)
Alkaline Phosphatase: 105 U/L (ref 38–126)
Anion gap: 13 (ref 5–15)
BUN: 12 mg/dL (ref 6–20)
CO2: 23 mmol/L (ref 22–32)
Calcium: 9.2 mg/dL (ref 8.9–10.3)
Chloride: 102 mmol/L (ref 101–111)
Creatinine, Ser: 0.8 mg/dL (ref 0.44–1.00)
GFR calc Af Amer: >60 mL/min (ref >60)
GFR calc non Af Amer: >60 mL/min (ref >60)
Glucose, Bld: 94 mg/dL (ref 65–99)
Potassium: 3.7 mmol/L (ref 3.5–5.1)
Sodium: 138 mmol/L (ref 135–145)
Total Bilirubin: 0.4 mg/dL (ref 0.3–1.2)
Total Protein: 7.1 g/dL (ref 6.5–8.1)

## 2015-05-17 LAB — CBC WITH DIFFERENTIAL/PLATELET
Basophils Absolute: 0 10*3/uL (ref 0.0–0.1)
Basophils Relative: 0 %
Eosinophils Absolute: 0 10*3/uL (ref 0.0–0.7)
Eosinophils Relative: 0 %
HCT: 39.3 % (ref 36.0–46.0)
Hemoglobin: 12.7 g/dL (ref 12.0–15.0)
Lymphocytes Relative: 19 %
Lymphs Abs: 0.9 10*3/uL (ref 0.7–4.0)
MCH: 28.1 pg (ref 26.0–34.0)
MCHC: 32.3 g/dL (ref 30.0–36.0)
MCV: 86.9 fL (ref 78.0–100.0)
Monocytes Absolute: 0.4 10*3/uL (ref 0.1–1.0)
Monocytes Relative: 8 %
Neutro Abs: 3.3 10*3/uL (ref 1.7–7.7)
Neutrophils Relative %: 73 %
Platelets: 245 10*3/uL (ref 150–400)
RBC: 4.52 MIL/uL (ref 3.87–5.11)
RDW: 13.5 % (ref 11.5–15.5)
WBC: 4.5 10*3/uL (ref 4.0–10.5)

## 2015-05-17 MED ORDER — ACETAMINOPHEN 325 MG PO TABS
ORAL_TABLET | ORAL | Status: AC
  Filled 2015-05-17: qty 2

## 2015-05-17 MED ORDER — OSELTAMIVIR PHOSPHATE 75 MG PO CAPS: 75.0000 mg | ORAL_CAPSULE | Freq: Two times a day (BID) | ORAL | Status: DC

## 2015-05-17 MED ORDER — ONDANSETRON HCL 4 MG PO TABS: 4.0000 mg | ORAL_TABLET | Freq: Three times a day (TID) | ORAL | Status: DC | PRN

## 2015-05-17 MED ORDER — IBUPROFEN 800 MG PO TABS: 800.0000 mg | ORAL_TABLET | Freq: Three times a day (TID) | ORAL | Status: DC

## 2015-05-17 MED ORDER — ONDANSETRON 4 MG PO TBDP
4.0000 mg | ORAL_TABLET | Freq: Once | ORAL | Status: AC
  Administered 2015-05-17: 4 mg via ORAL
  Filled 2015-05-17: qty 1

## 2015-05-17 MED ORDER — ACETAMINOPHEN 325 MG PO TABS
650.0000 mg | ORAL_TABLET | Freq: Once | ORAL | Status: AC | PRN
  Administered 2015-05-17: 650 mg via ORAL

## 2015-05-17 MED ORDER — IBUPROFEN 800 MG PO TABS
800.0000 mg | ORAL_TABLET | Freq: Once | ORAL | Status: AC
  Administered 2015-05-17: 800 mg via ORAL
  Filled 2015-05-17: qty 1

## 2015-05-17 MED ORDER — SODIUM CHLORIDE 0.9 % IV BOLUS (SEPSIS)
1000.0000 mL | Freq: Once | INTRAVENOUS | Status: AC
  Administered 2015-05-17: 1000 mL via INTRAVENOUS

## 2015-05-17 NOTE — Patient Instructions (Signed)
I am recommending that you go to the emergency department for rehydration. Your orthostatic vitals are positive and I do not think you can drink enough fluids in your current state to change this any time soon.   Stay well hydrated, drink ginger ale if water is making you nauseated. Take ibuprofen and tylenol for fever and body aches. I am prescribing Zofran to take as needed for nausea.  Take Tamiflu as we discussed but be aware of any side effects such as abnormal thoughts or behaviors and stop the medication if this occurs.   Influenza, Adult Influenza ("the flu") is a viral infection of the respiratory tract. It occurs more often in winter months because people spend more time in close contact with one another. Influenza can make you feel very sick. Influenza easily spreads from person to person (contagious). CAUSES  Influenza is caused by a virus that infects the respiratory tract. You can catch the virus by breathing in droplets from an infected person's cough or sneeze. You can also catch the virus by touching something that was recently contaminated with the virus and then touching your mouth, nose, or eyes. RISKS AND COMPLICATIONS You may be at risk for a more severe case of influenza if you smoke cigarettes, have diabetes, have chronic heart disease (such as heart failure) or lung disease (such as asthma), or if you have a weakened immune system. Elderly people and pregnant women are also at risk for more serious infections. The most common problem of influenza is a lung infection (pneumonia). Sometimes, this problem can require emergency medical care and may be life threatening. SIGNS AND SYMPTOMS  Symptoms typically last 4 to 10 days and may include:  Fever.  Chills.  Headache, body aches, and muscle aches.  Sore throat.  Chest discomfort and cough.  Poor appetite.  Weakness or feeling tired.  Dizziness.  Nausea or vomiting. DIAGNOSIS  Diagnosis of influenza is often made  based on your history and a physical exam. A nose or throat swab test can be done to confirm the diagnosis. TREATMENT  In mild cases, influenza goes away on its own. Treatment is directed at relieving symptoms. For more severe cases, your health care provider may prescribe antiviral medicines to shorten the sickness. Antibiotic medicines are not effective because the infection is caused by a virus, not by bacteria. HOME CARE INSTRUCTIONS  Take medicines only as directed by your health care provider.  Use a cool mist humidifier to make breathing easier.  Get plenty of rest until your temperature returns to normal. This usually takes 3 to 4 days.  Drink enough fluid to keep your urine clear or pale yellow.  Cover yourmouth and nosewhen coughing or sneezing,and wash your handswellto prevent thevirusfrom spreading.  Stay homefromwork orschool untilthe fever is gonefor at least 42full day. PREVENTION  An annual influenza vaccination (flu shot) is the best way to avoid getting influenza. An annual flu shot is now routinely recommended for all adults in the [place] IF:  You experiencechest pain, yourcough worsens,or you producemore mucus.  Youhave nausea,vomiting, ordiarrhea.  Your fever returns or gets worse. SEEK IMMEDIATE MEDICAL CARE IF:  You havetrouble breathing, you become short of breath,or your skin ornails becomebluish.  You have severe painor stiffnessin the neck.  You develop a sudden headache, or pain in the face or ear.  You have nausea or vomiting that you cannot control. MAKE SURE YOU:   Understand these instructions.  Will watch your condition.  Will get help right away if you are not doing well or get worse.   This information is not intended to replace advice given to you by your health care provider. Make sure you discuss any questions you have with your health care provider.   Document Released: 03/17/2000 Document  Revised: 04/10/2014 Document Reviewed: 06/19/2011 Elsevier Interactive Patient Education [employer].

## 2015-05-17 NOTE — Discharge Instructions (Signed)

## 2015-05-17 NOTE — ED Provider Notes (Signed)
CSN: [id number]     Arrival date & time 05/17/15  [id number] History   First MD Initiated Contact with Patient 05/17/15 1343     Chief Complaint  Patient presents with  . Influenza     (Consider location/radiation/quality/duration/timing/severity/associated sxs/prior Treatment) Patient is a 55 y.o. female presenting with flu symptoms.  Influenza Presenting symptoms: cough, fatigue, fever, headache, myalgias, nausea and sore throat   Presenting symptoms: no diarrhea, no shortness of breath and no vomiting   Severity:  Moderate Onset quality:  Gradual Duration:  2 days Progression:  Worsening Chronicity:  New Relieved by:  OTC medications and rest Worsened by:  Nothing tried Ineffective treatments:  None tried Associated symptoms: chills, decreased appetite and nasal congestion   Associated symptoms: no neck stiffness   Risk factors: sick contacts    Lisa Briggs is a 55 y.o. female with PMH significant for HTN, GERD, chronic back pain, HLD, GIST who presents with influenza.  Patient was seen at PCP office just PTA who sent her for IVF.  She was discharged with prescriptions for tamiflu and zofran. Patient reports she began experiencing sore throat, generalized body aches, nasal congestion, headache, and nausea about 2 days ago.  Mild relief with tylenol and throat lozenges.  No aggravating factors.  Positive sick contacts. She did not get her flu shot this year.   Past Medical History  Diagnosis Date  . Hypertension   . GERD (gastroesophageal reflux disease)   . Allergy   . Farsightedness     wears glasses, [facility]  . History of uterine fibroid   . Chronic back pain   . Polyarthralgia     normal rheumatoid screen 01/2012  . Constipation   . Chest pain 04/05/2011    cardiac eval, normal treadmill stress test, Dr. Tollie Eth  . Anemia     iron therapy for years as of 10/12; normal Hgb 08/2013  . Hyperlipidemia   . Arthritis   . 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 [name], [facility]  . Paresthesia 09/2014    initially thought to be TIA, neurology consult in 12/2014 with other non TIA considerations.     Past Surgical History  Procedure Laterality Date  . Lipoma excision      forehead  . Knee arthroscopy Left   . Uterine fibroid surgery    . Gall stone surgery    . Colonoscopy  01/2014    diverticulosis, othwerise normal - Dr. Owens Loffler  . Esophagogastroduodenoscopy  2013    Dr. Benson Norway, gastritis  . Esophagogastroduodenoscopy  [id number]  . Eus N/A 04/16/2014    Procedure: UPPER ENDOSCOPIC ULTRASOUND (EUS) LINEAR;  Surgeon: Milus Banister, MD;  Location: [facility] ENDOSCOPY;  Service: Endoscopy;  Laterality: N/A;  . Laparoscopic gastric resection N/A 06/09/2014    Procedure: LAPAROSCOPIC GASTRIC MASS RESECTION;  Surgeon: [name], MD;  Location: [facility] ORS;  Service: General;  Laterality: N/A;   Family History  Problem Relation Age of Onset  . Hypertension Mother   . Arthritis Mother   . GER disease Mother   . Glaucoma Mother   . Stroke Father   . Alcohol abuse Father   . Breast cancer Sister     breast cancer dx late 63s; another sister with leukemia  . Hypertension Sister   . Heart disease Sister     heart murmur; other sisters with heart problems  . Diabetes Brother   . Diabetes Maternal Aunt   .  Heart disease Maternal Aunt   . Heart disease Maternal Grandmother   . Heart disease Maternal Grandfather   . Heart disease Maternal Uncle   . Stroke Maternal Uncle   . Colon cancer Brother 64  . Colon polyps Neg Hx    Social History  Substance Use Topics  . Smoking status: Never Smoker   . Smokeless tobacco: Never Used  . Alcohol Use: No   OB History    No data available     Review of Systems  Constitutional: Positive for fever, chills, fatigue and decreased appetite.  HENT: Positive for congestion and sore throat.   Respiratory: Positive for cough. Negative for shortness of breath.    Gastrointestinal: Positive for nausea. Negative for vomiting, abdominal pain and diarrhea.  Musculoskeletal: Positive for myalgias. Negative for neck pain and neck stiffness.  Neurological: Positive for headaches.  All other systems reviewed and are negative.     Allergies  Mucinex dm  Home Medications   Prior to Admission medications   Medication Sig Start Date End Date Taking? Authorizing Provider  aspirin EC 81 MG tablet Take 1 tablet (81 mg total) by mouth daily. 03/23/15   Camelia Eng Tysinger, PA-C  dexlansoprazole (DEXILANT) 60 MG capsule Take 1 capsule (60 mg total) by mouth daily. 03/23/15   Camelia Eng Tysinger, PA-C  famotidine (PEPCID) 20 MG tablet Take 1 tablet (20 mg total) by mouth at bedtime. 03/23/15   Camelia Eng Tysinger, PA-C  ferrous fumarate (HEMOCYTE - 106 MG FE) 325 (106 FE) MG TABS tablet Take 1 tablet (106 mg of iron total) by mouth daily. 03/23/15   Camelia Eng Tysinger, PA-C  fluticasone (FLONASE) 50 MCG/ACT nasal spray Place 1 spray into both nostrils daily as needed for allergies or rhinitis. 05/25/14   Camelia Eng Tysinger, PA-C  gabapentin (NEURONTIN) 300 MG capsule Take 1 capsule (300 mg total) by mouth at bedtime. 04/29/15   Donika Keith Rake, DO  loratadine (CLARITIN) 10 MG tablet Take 1 tablet (10 mg total) by mouth daily. 05/25/14   Camelia Eng Tysinger, PA-C  losartan-hydrochlorothiazide (HYZAAR) 50-12.5 MG tablet Take 1 tablet by mouth daily. 03/23/15   Camelia Eng Tysinger, PA-C  ondansetron (ZOFRAN) 4 MG tablet Take 1 tablet (4 mg total) by mouth every 8 (eight) hours as needed for nausea or vomiting. 05/17/15   Girtha Rm, NP  oseltamivir (TAMIFLU) 75 MG capsule Take 1 capsule (75 mg total) by mouth 2 (two) times daily. 05/17/15   Girtha Rm, NP  polyethylene glycol powder (GLYCOLAX/MIRALAX) powder TAKE ONE CAPFUL (255 GRAMS) BY MOUTH DAILY 03/23/15   Camelia Eng Tysinger, PA-C  potassium chloride (K-DUR,KLOR-CON) 10 MEQ tablet Take 1 tablet (10 mEq total) by mouth daily.  03/23/15   Camelia Eng Tysinger, PA-C  simvastatin (ZOCOR) 20 MG tablet Take 1 tablet (20 mg total) by mouth daily. 03/23/15   Camelia Eng Tysinger, PA-C  Vitamin D, Ergocalciferol, (DRISDOL) 50000 UNITS CAPS capsule Take 1 capsule (50,000 Units total) by mouth every 7 (seven) days. 03/23/15   Camelia Eng Tysinger, PA-C   BP 94/56 mmHg  Pulse 105  Temp(Src) 101.7 F (38.7 C) (Oral)  Resp 16  Ht 5' 8.5" (1.74 m)  Wt 101.152 kg  BMI 33.41 kg/m2  SpO2 98%  LMP 02/23/2015 Physical Exam  Constitutional: She is oriented to person, place, and time. She appears well-developed and well-nourished.  Non-toxic appearance.  HENT:  Head: Normocephalic and atraumatic.  Mouth/Throat: Uvula is midline and oropharynx is clear and  moist. Mucous membranes are dry. No oropharyngeal exudate, posterior oropharyngeal edema, posterior oropharyngeal erythema or tonsillar abscesses.  Eyes: Conjunctivae are normal. Pupils are equal, round, and reactive to light.  Neck: Normal range of motion. Neck supple.  No nuchal rigidity.   Cardiovascular: Normal rate, regular rhythm and normal heart sounds.   No murmur heard. Pulmonary/Chest: Effort normal and breath sounds normal. No accessory muscle usage or stridor. No respiratory distress. She has no wheezes. She has no rhonchi. She has no rales.  Abdominal: Soft. Bowel sounds are normal. She exhibits no distension. There is no tenderness.  Musculoskeletal: Normal range of motion.  Lymphadenopathy:    She has no cervical adenopathy.  Neurological: She is alert and oriented to person, place, and time.  Speech clear without dysarthria.  Skin: Skin is warm and dry.  Psychiatric: She has a normal mood and affect. Her behavior is normal.    ED Course  Procedures (including critical care time) Labs Review Labs Reviewed  CULTURE, BLOOD (ROUTINE X 2)  CULTURE, BLOOD (ROUTINE X 2)  URINE CULTURE  COMPREHENSIVE METABOLIC PANEL  CBC WITH DIFFERENTIAL/PLATELET  URINALYSIS, ROUTINE  W REFLEX MICROSCOPIC (NOT AT [facility])  I-STAT CG4 LACTIC ACID, ED  I-STAT CG4 LACTIC ACID, ED    Imaging Review Dg Chest 2 View  05/17/2015  CLINICAL DATA:  Cough, sore throat on Saturday, flu like symptoms EXAM: CHEST  2 VIEW COMPARISON:  11/17/2013 FINDINGS: The heart size and mediastinal contours are within normal limits. Both lungs are clear. The visualized skeletal structures are unremarkable. IMPRESSION: No active cardiopulmonary disease. Electronically Signed   By: Kathreen Devoid   On: 05/17/2015 12:08   I have personally reviewed and evaluated these images and lab results as part of my medical decision-making.   EKG Interpretation None      MDM   Final diagnoses:  Influenza    VSS, NAD.  Patient febrile, 101.7 with mild tachycardia HR 105.  Labs without acute abnormalities.  Plan to administer IVF and zofran and encourage PO fluids.  Patient has prescriptions for Tamiflu and Zofran. Tachycardia improved with IVF, patient tolerating PO intake.  Plan to discharge home with ibuprofen and PCP follow up in 2 days.  Discussed return precautions.  Patient agrees and acknowledges the above plan for discharge. Case has been discussed with Dr. Venora Maples who agrees with the above plan for discharge.      Gloriann Loan, PA-C 05/17/15 [place], MD 05/18/15 [phone]

## 2015-05-17 NOTE — ED Notes (Signed)
Pt sent from [place] for further eval of flu symptoms and to get IV fluids. Pt reports started with sore throat on Saturday. FLU symptoms onset Sunday.

## 2015-05-17 NOTE — Progress Notes (Signed)
   Subjective:    Patient ID: Lisa Briggs, female    DOB: 25-May-1960, 55 y.o.   MRN: [id number]  HPI Chief Complaint  Patient presents with  . sick    body aches, sinus pressure, headache, cough with chest congestion. fever.    She is here with complaints of coughing and itchy throat 2 days ago around 2 pm and then yesterday she developed worsening sore throat, left ear pain , headache, chills, fever, nausea and sinus pressure. Denies vomiting or diarrhea.  She did not get flu shot this year. Positive sick contacts.  Using throat lozenges.  She drove herself. She is somewhat uncooperative and is slow to respond.    Review of Systems Pertinent positives and negatives in the history of present illness.     Objective:   Physical Exam  Constitutional: She is oriented to person, place, and time. She appears well-developed. She appears lethargic. She appears ill.  HENT:  Mouth/Throat: Uvula is midline. Mucous membranes are dry.  Oropharynx clear and dry, mucus membranes dry.   Cardiovascular: Normal rate, regular rhythm, normal heart sounds and normal pulses.   Pulmonary/Chest: Effort normal and breath sounds normal.  Abdominal: Soft. Normal appearance. Bowel sounds are decreased. There is no hepatosplenomegaly. There is no tenderness. There is no rigidity, no rebound, no guarding, no CVA tenderness, no tenderness at McBurney's point and negative Murphy's sign.  Neurological: She is oriented to person, place, and time. She appears lethargic.   BP 124/78 mmHg  Pulse 68  Temp(Src) 100.3 F (37.9 C) (Oral)  Resp 16  LMP 02/23/2015  Orthostatic vitals: Laying 138/90 pulse 95  Sitting 118/80 pulse 102 Standing 118/80 pulse 118     Assessment & Plan:  Flu-like symptoms - Plan: oseltamivir (TAMIFLU) 75 MG capsule, Orthostatic vital signs  Nausea without vomiting - Plan: ondansetron (ZOFRAN) 4 MG tablet  Influenza A - Plan: oseltamivir (TAMIFLU) 75 MG capsule  Recommend that she  goes to the emergency department for IV hydration. She is positive for influenza and is postural based on orthostatic vitals. Based on her appearance in my office today and complaints of nausea when drinking fluids, I do think she is able to rehydrate herself.  appropriately at home. I am sending a prescription for Zofran to her pharmacy for nausea. Prescription for Tamiflu also sent to her pharmacy and discussed in depth possible side effects of this medication including CNS effects. Husband called to transport patient to the ED.  Recommend she follow up if not improving in next 2-3 days. Triage nurse at [facility] ED, Lytle Michaels, called and aware of patient's soon arrival.

## 2015-05-17 NOTE — ED Notes (Signed)
Patient up to restroom.  Gait steady and even.   

## 2015-05-19 ENCOUNTER — Telehealth: Payer: Self-pay | Admitting: Medical

## 2015-05-19 NOTE — Telephone Encounter (Signed)
I recommend that the patient get over-the-counter cough medication that she let us know if her cough gets worse. I also recommend that she take Tylenol or ibuprofen for fever and body aches. She should stay well-hydrated and let us know if she is not getting better.

## 2015-05-19 NOTE — Telephone Encounter (Signed)
Pt was told to take some ibuprofen or tylenol and get some cough syrup for cough, as long as the blood is just alittle bit and not a lot she should be ok. If it is a lot of cough coming up and not just alittle she was asked to go and get checked out at the ER (per Vickie). Pt was advised if not getting better to let us know

## 2015-05-19 NOTE — Telephone Encounter (Signed)
Pt said yesterday she was coughing up a little blood. So far today she has not coughed up any blood. She did not go to work today. Still feels bad. Been drinking Gingerale & Gatorade, have not taken Tylenol because she does not have any yet. She feels warm as if she has a fever but has not checked temp. Husband is sick now so she has to find someone to help her & to get Tylenol.

## 2015-05-21 ENCOUNTER — Ambulatory Visit (INDEPENDENT_AMBULATORY_CARE_PROVIDER_SITE_OTHER): Payer: BLUE CROSS/BLUE SHIELD | Admitting: Medical

## 2015-05-21 ENCOUNTER — Encounter: Payer: Self-pay | Admitting: Medical

## 2015-05-21 VITALS — BP 110/70 | HR 68 | Temp 97.9°F | Wt 217.0 lb

## 2015-05-21 DIAGNOSIS — Z8619 Personal history of other infectious and parasitic diseases: Secondary | ICD-10-CM

## 2015-05-21 DIAGNOSIS — R059 Cough, unspecified: Secondary | ICD-10-CM

## 2015-05-21 DIAGNOSIS — Z23 Encounter for immunization: Secondary | ICD-10-CM | POA: Diagnosis not present

## 2015-05-21 DIAGNOSIS — Z8709 Personal history of other diseases of the respiratory system: Secondary | ICD-10-CM

## 2015-05-21 DIAGNOSIS — R042 Hemoptysis: Secondary | ICD-10-CM | POA: Diagnosis not present

## 2015-05-21 DIAGNOSIS — R05 Cough: Secondary | ICD-10-CM

## 2015-05-21 MED ORDER — AMOXICILLIN 875 MG PO TABS: 875.0000 mg | ORAL_TABLET | Freq: Two times a day (BID) | ORAL | Status: DC

## 2015-05-21 NOTE — Progress Notes (Addendum)
Subjective: Chief Complaint  Patient presents with  . Follow-up    hospital f/up states she feels better but still a little weak on and off pressure over her rt eye   Here for hospital f/u for flu.  Was seen here 05/16/14 for flu, sent to hospital for eval and fluids.  She is hydrating better.   Feels overal improved, but still a little weak.  Still has moderate cough at night.  Some phlegm with cough.  Some blood tinged mucous from nose and cough.  Has some sinus pressure over eyes.  No additional fever. Using gingerale and tylenol.  Has hx/o sinusitis too.  She is requesting pneumococcal vaccine.  No other aggravating or relieving factors. No other complaint.   Past Medical History  Diagnosis Date  . Hypertension   . GERD (gastroesophageal reflux disease)   . Allergy   . Farsightedness     wears glasses, [facility] care center  . History of uterine fibroid   . Chronic back pain   . Polyarthralgia     normal rheumatoid screen 01/2012  . Constipation   . Chest pain 04/05/2011    cardiac eval, normal treadmill stress test, Dr. Tollie Eth  . Anemia     iron therapy for years as of 10/12; normal Hgb 08/2013  . Hyperlipidemia   . Arthritis   . 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 [name], [facility]  . Paresthesia 09/2014    initially thought to be TIA, neurology consult in 12/2014 with other non TIA considerations.     ROS as in subjective  Objective: BP 110/70 mmHg  Pulse 68  Temp(Src) 97.9 F (36.6 C) (Tympanic)  Wt 217 lb (98.431 kg)  LMP 02/23/2015  General appearance: alert, no distress, WD/WN HEENT: normocephalic, sclerae anicteric, TMs pearly, sinus tendnerss, nares with turbinated edema,  no discharge or erythema, pharynx with some post nasal drainage Oral cavity: MMM, no lesions Neck: supple, no lymphadenopathy, no thyromegaly, no masses Heart: RRR, normal S1, S2, no murmurs Lungs: CTA bilaterally, no wheezes, rhonchi, or rales Pulses:  2+ symmetric, upper and lower extremities, normal cap refill    Assessment: Encounter Diagnoses  Name Primary?  . H/O influenza Yes  . Blood-tinged sputum   . Need for prophylactic vaccination against Streptococcus pneumoniae (pneumococcus)   . Cough     Plan: Reviewed ED report, 05/17/15 chest xray.   Begin Amoxicillin, rest, c/t increased hydration to clear mucous, can use benadryl OTC, and if not resolved within a week, then call or recheck.     Counseled on the pneumococcal vaccine.  Vaccine information sheet given.  Pneumococcal vaccine PPSV 23 given after consent obtained.

## 2015-05-22 LAB — CULTURE, BLOOD (ROUTINE X 2): Culture: NO GROWTH

## 2015-05-23 LAB — CULTURE, BLOOD (ROUTINE X 2): Culture: NO GROWTH

## 2015-05-24 ENCOUNTER — Other Ambulatory Visit: Payer: Self-pay | Admitting: Medical

## 2015-05-24 ENCOUNTER — Telehealth: Payer: Self-pay | Admitting: Medical

## 2015-05-24 NOTE — Telephone Encounter (Signed)
Pt dropped of FMLA paper work to be completed. Please call pt at [phone] when ready.

## 2015-05-25 ENCOUNTER — Telehealth: Payer: Self-pay | Admitting: Medical

## 2015-05-25 NOTE — Telephone Encounter (Signed)
Received FMLA for her flu illness.  What days was she absent?

## 2015-05-26 ENCOUNTER — Telehealth: Payer: Self-pay | Admitting: Medical

## 2015-05-26 ENCOUNTER — Encounter: Payer: Self-pay | Admitting: Medical

## 2015-05-26 ENCOUNTER — Other Ambulatory Visit: Payer: Self-pay | Admitting: Medical

## 2015-05-26 NOTE — Telephone Encounter (Signed)
Forwarding to kathy  

## 2015-05-26 NOTE — Telephone Encounter (Signed)
Lisa Briggs states she dropped them off yesterday so she sent them back,

## 2015-05-26 NOTE — Telephone Encounter (Signed)
Left message for pt to call. Per shane FMLA will not be filled out as she already has a active one. Another one is not necessary. A letter was faxed to pt's employer explaining this. It was faxed to [phone].

## 2015-05-26 NOTE — Telephone Encounter (Signed)
Called and left message to call back with dates

## 2015-05-26 NOTE — Telephone Encounter (Signed)
Regarding FMLA, why do I have to do a new one.   She has one on file from other chronic issues, shouldn't need a new FMLA for this recent flu illness.   Please let her and employer know, or she can let employer know to consult to the FMLA on file she already has.  We already gave doctor's note from being here, ill.

## 2015-05-26 NOTE — Telephone Encounter (Signed)
No, that is not the question.  I have the FMLA form, put back in [employer].   My issue is that I shouldn't have to fill one out.  We gave a work note and she already has and active FMLA on file.  I believe you only get 1 FMLA per 89mo.  So she /employer needs to accept our work notes and refer back to her FMLA if she has missed some work.

## 2015-05-27 NOTE — Telephone Encounter (Signed)
Is this ok to refill?  

## 2015-06-24 ENCOUNTER — Telehealth: Payer: Self-pay | Admitting: Medical

## 2015-06-24 NOTE — Telephone Encounter (Signed)
Pt came by the office and states her job will not accept only the out of work notes, they require certification which is the FMLA paper to cover her for the week that we had her out of work.  Their response to your letter stating that pt has an FMLA on file is that that FMLA was expired.  Pt is on a point system at work and they are threatening her with 8 points for this and she can only get a total of 9 points or she is fired.  Form in your folder

## 2015-06-25 NOTE — Telephone Encounter (Signed)
FMLA's printed and put on your desk.  The new FMLA she dropped off yesterday was put in your folder

## 2015-06-25 NOTE — Telephone Encounter (Signed)
Pull the last 2 FMLA, get me a new FMLA to complete, and make sure she gives the exact dates she was out

## 2015-06-28 NOTE — Telephone Encounter (Signed)
Pt called to check on FMLA,  Advised not ready yet

## 2015-06-29 NOTE — Telephone Encounter (Signed)
Left message for pt FMLA completed & faxed and copy up front for pt

## 2015-06-29 NOTE — Telephone Encounter (Signed)
done

## 2015-06-30 ENCOUNTER — Other Ambulatory Visit: Payer: Self-pay | Admitting: Medical

## 2015-06-30 NOTE — Telephone Encounter (Signed)
Not listed as a current med is this ok to fill?

## 2015-07-06 ENCOUNTER — Other Ambulatory Visit: Payer: Self-pay | Admitting: Medical

## 2015-07-07 NOTE — Telephone Encounter (Signed)
Is this ok to refill?  

## 2015-07-08 ENCOUNTER — Other Ambulatory Visit: Payer: Self-pay | Admitting: Medical

## 2015-07-08 ENCOUNTER — Telehealth: Payer: Self-pay | Admitting: Medical

## 2015-07-08 NOTE — Telephone Encounter (Signed)
Pt states got jury duty summons and would like to be excused, she will bring form by for completion.

## 2015-07-22 ENCOUNTER — Telehealth: Payer: Self-pay | Admitting: Medical

## 2015-07-22 NOTE — Telephone Encounter (Signed)
Pt got [employer] and would like to be excused, form in your folder

## 2015-07-23 NOTE — Telephone Encounter (Signed)
For what reasons does she feel she can't sit in a court room for a case?

## 2015-07-27 NOTE — Telephone Encounter (Signed)
Pt called and states the stress of jury duty sitting in a court room gets her reflux going and her stomach upset.  Also she cannot sit for long periods of time.  Please advise of her excuse.

## 2015-07-27 NOTE — Telephone Encounter (Signed)
Left message for pt

## 2015-07-28 ENCOUNTER — Other Ambulatory Visit: Payer: Self-pay | Admitting: Medical

## 2015-07-28 ENCOUNTER — Telehealth: Payer: Self-pay | Admitting: Medical

## 2015-07-28 ENCOUNTER — Encounter: Payer: Self-pay | Admitting: Medical

## 2015-07-28 NOTE — Telephone Encounter (Signed)
error 

## 2015-07-28 NOTE — Telephone Encounter (Signed)
Left message for pt that form & letter ready for pick up

## 2015-07-28 NOTE — Telephone Encounter (Signed)
Copy and return form, and I printed accompanying letter.    Please charge a form completion fee.

## 2015-08-17 DIAGNOSIS — L218 Other seborrheic dermatitis: Secondary | ICD-10-CM | POA: Diagnosis not present

## 2015-08-17 DIAGNOSIS — L700 Acne vulgaris: Secondary | ICD-10-CM | POA: Diagnosis not present

## 2015-08-24 ENCOUNTER — Other Ambulatory Visit: Payer: Self-pay | Admitting: Medical

## 2015-09-24 ENCOUNTER — Telehealth: Payer: Self-pay | Admitting: Medical

## 2015-09-24 NOTE — Telephone Encounter (Signed)
Pt called stating that her Disability parking placard will expire at the end of the month so requesting that Audelia Acton complete the form again. Form placed in Shane's folder. Call pt at [phone] when form is completed

## 2015-09-27 ENCOUNTER — Encounter (HOSPITAL_COMMUNITY): Payer: Self-pay | Admitting: Nurse Practitioner

## 2015-09-27 ENCOUNTER — Emergency Department (HOSPITAL_COMMUNITY)
Admission: EM | Admit: 2015-09-27 | Discharge: 2015-09-27 | Disposition: A | Discharge status: Home / Self Care | Payer: BLUE CROSS/BLUE SHIELD | Attending: Emergency Medicine | Admitting: Emergency Medicine

## 2015-09-27 DIAGNOSIS — Z91010 Allergy to peanuts: Secondary | ICD-10-CM | POA: Diagnosis not present

## 2015-09-27 DIAGNOSIS — R1032 Left lower quadrant pain: Secondary | ICD-10-CM | POA: Diagnosis not present

## 2015-09-27 DIAGNOSIS — R1030 Lower abdominal pain, unspecified: Secondary | ICD-10-CM | POA: Diagnosis present

## 2015-09-27 DIAGNOSIS — I10 Essential (primary) hypertension: Secondary | ICD-10-CM | POA: Insufficient documentation

## 2015-09-27 DIAGNOSIS — Z7982 Long term (current) use of aspirin: Secondary | ICD-10-CM | POA: Diagnosis not present

## 2015-09-27 DIAGNOSIS — Z79899 Other long term (current) drug therapy: Secondary | ICD-10-CM | POA: Insufficient documentation

## 2015-09-27 LAB — COMPREHENSIVE METABOLIC PANEL
ALT: 24 U/L (ref 14–54)
AST: 28 U/L (ref 15–41)
Albumin: 3.8 g/dL (ref 3.5–5.0)
Alkaline Phosphatase: 107 U/L (ref 38–126)
Anion gap: 9 (ref 5–15)
BUN: 13 mg/dL (ref 6–20)
CO2: 22 mmol/L (ref 22–32)
Calcium: 9.7 mg/dL (ref 8.9–10.3)
Chloride: 109 mmol/L (ref 101–111)
Creatinine, Ser: 0.87 mg/dL (ref 0.44–1.00)
GFR calc Af Amer: >60 mL/min (ref >60)
GFR calc non Af Amer: >60 mL/min (ref >60)
Glucose, Bld: 90 mg/dL (ref 65–99)
Potassium: 4.8 mmol/L (ref 3.5–5.1)
Sodium: 140 mmol/L (ref 135–145)
Total Bilirubin: 0.7 mg/dL (ref 0.3–1.2)
Total Protein: 7.4 g/dL (ref 6.5–8.1)

## 2015-09-27 LAB — CBC
HCT: 40.8 % (ref 36.0–46.0)
Hemoglobin: 12.8 g/dL (ref 12.0–15.0)
MCH: 27.1 pg (ref 26.0–34.0)
MCHC: 31.4 g/dL (ref 30.0–36.0)
MCV: 86.3 fL (ref 78.0–100.0)
Platelets: 353 10*3/uL (ref 150–400)
RBC: 4.73 MIL/uL (ref 3.87–5.11)
RDW: 13.4 % (ref 11.5–15.5)
WBC: 8.3 10*3/uL (ref 4.0–10.5)

## 2015-09-27 LAB — URINALYSIS, ROUTINE W REFLEX MICROSCOPIC
Bilirubin Urine: NEGATIVE
Glucose, UA: NEGATIVE mg/dL
Hgb urine dipstick: NEGATIVE
Ketones, ur: NEGATIVE mg/dL
Leukocytes, UA: NEGATIVE
Nitrite: NEGATIVE
Protein, ur: NEGATIVE mg/dL
Specific Gravity, Urine: 1.023 (ref 1.005–1.030)
pH: 7.5 (ref 5.0–8.0)

## 2015-09-27 LAB — PREGNANCY, URINE: Preg Test, Ur: NEGATIVE

## 2015-09-27 MED ORDER — DICYCLOMINE HCL 20 MG PO TABS: 20.0000 mg | ORAL_TABLET | Freq: Two times a day (BID) | ORAL | Status: DC

## 2015-09-27 MED ORDER — CIPROFLOXACIN HCL 500 MG PO TABS: 500.0000 mg | ORAL_TABLET | Freq: Two times a day (BID) | ORAL | Status: DC

## 2015-09-27 MED ORDER — METRONIDAZOLE 500 MG PO TABS: 500.0000 mg | ORAL_TABLET | Freq: Two times a day (BID) | ORAL | Status: DC

## 2015-09-27 NOTE — Discharge Instructions (Signed)

## 2015-09-27 NOTE — ED Notes (Signed)
Family at bedside. 

## 2015-09-27 NOTE — ED Provider Notes (Signed)
CSN: [id number]     Arrival date & time 09/27/15  1154 History   First MD Initiated Contact with Patient 09/27/15 1740     Chief Complaint  Patient presents with  . GI Problem     (Consider location/radiation/quality/duration/timing/severity/associated sxs/prior Treatment) HPI Comments: The patient is a 55 year old female, she has a history of "GI problems". She takes medicine for acid reflux including Dexilant as well as Pepcid, she takes nausea medications because of acid reflux.  She reports that 2 days ago she started to develop a pinprick sensation in her left lower side, this was intermittent, not so much present yesterday but today while she was at work she developed some mid and lower abdominal cramping as well as nausea and one episode of vomiting. She does not have any diarrhea in fact she takes polyethylene glycol for constipation. She denies urinary symptoms. Denies fevers or chills or lower back pain.  Sx are mild and she declines at this time pain meds or nausea meds.  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  Patient is a 55 y.o. female presenting with GI illness. The history is provided by the patient.  GI Problem    Past Medical History  Diagnosis Date  . Hypertension   . GERD (gastroesophageal reflux disease)   . Allergy   . Farsightedness     wears glasses, [facility]  . History of uterine fibroid   . Chronic back pain   . Polyarthralgia     normal rheumatoid screen 01/2012  . Constipation   . Chest pain 04/05/2011    cardiac eval, normal treadmill stress test, Dr. Tollie Eth  . Anemia     iron therapy for years as of 10/12; normal Hgb 08/2013  . Hyperlipidemia   . Arthritis   . 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 [name], [facility]  . Paresthesia 09/2014    initially thought to be TIA, neurology consult in 12/2014 with other non TIA  considerations.     Past Surgical History  Procedure Laterality Date  . Lipoma excision      forehead  . Knee arthroscopy Left   . Uterine fibroid surgery    . Gall stone surgery    . Colonoscopy  01/2014    diverticulosis, othwerise normal - Dr. Owens Loffler  . Esophagogastroduodenoscopy  2013    Dr. Benson Norway, gastritis  . Esophagogastroduodenoscopy  [id number]  . Eus N/A 04/16/2014    Procedure: UPPER ENDOSCOPIC ULTRASOUND (EUS) LINEAR;  Surgeon: Milus Banister, MD;  Location: [facility];  Service: Endoscopy;  Laterality: N/A;  . Laparoscopic gastric resection N/A 06/09/2014    Procedure: LAPAROSCOPIC GASTRIC MASS RESECTION;  Surgeon: [name], MD;  Location: [facility];  Service: General;  Laterality: N/A;   Family History  Problem Relation Age of Onset  . Hypertension Mother   . Arthritis Mother   . GER disease Mother   . Glaucoma Mother   . Stroke Father   . Alcohol abuse Father   . Breast cancer Sister     breast cancer dx late 41s; another sister with leukemia  . Hypertension Sister   . Heart disease Sister     heart murmur; other sisters with heart problems  . Diabetes Brother   . Diabetes Maternal Aunt   . Heart disease Maternal Aunt   . Heart disease Maternal Grandmother   . Heart disease Maternal Grandfather   .  Heart disease Maternal Uncle   . Stroke Maternal Uncle   . Colon cancer Brother 49  . Colon polyps Neg Hx    Social History  Substance Use Topics  . Smoking status: Never Smoker   . Smokeless tobacco: Never Used  . Alcohol Use: No   OB History    No data available     Review of Systems  All other systems reviewed and are negative.     Allergies  Kiwi extract; Mucinex dm; Peach; and Peanut-containing drug products  Home Medications   Prior to Admission medications   Medication Sig Start Date End Date Taking? Authorizing Provider  aspirin EC 81 MG tablet Take 1 tablet (81 mg total) by mouth daily. 03/23/15  Yes Camelia Eng Tysinger, PA-C   cyclobenzaprine (FLEXERIL) 10 MG tablet TAKE ONE TABLET BY MOUTH TWICE DAILY AS NEEDED FOR  MUSCLE  SPASM 07/07/15  Yes Camelia Eng Tysinger, PA-C  dexlansoprazole (DEXILANT) 60 MG capsule Take 1 capsule (60 mg total) by mouth daily. 03/23/15  Yes Camelia Eng Tysinger, PA-C  Doxycycline Hyclate 150 MG TABS Take 150 mg by mouth daily.   Yes Historical Provider, MD  EQ LORATADINE 10 MG tablet TAKE ONE TABLET BY MOUTH ONCE DAILY 07/29/15  Yes Camelia Eng Tysinger, PA-C  famotidine (PEPCID) 20 MG tablet Take 1 tablet (20 mg total) by mouth at bedtime. 03/23/15  Yes David S Tysinger, PA-C  FERROCITE 324 MG TABS tablet TAKE ONE TABLET BY MOUTH TWICE DAILY Patient taking differently: Take 1 tablet by mouth once daily 08/25/15  Yes Camelia Eng Tysinger, PA-C  fluticasone (FLONASE) 50 MCG/ACT nasal spray Place 1 spray into both nostrils daily as needed for allergies or rhinitis. 05/25/14  Yes Camelia Eng Tysinger, PA-C  gabapentin (NEURONTIN) 300 MG capsule Take 1 capsule (300 mg total) by mouth at bedtime. 04/29/15  Yes Donika K Patel, DO  losartan-hydrochlorothiazide (HYZAAR) 50-12.5 MG tablet Take 1 tablet by mouth daily. 03/23/15  Yes Camelia Eng Tysinger, PA-C  meloxicam (MOBIC) 15 MG tablet TAKE ONE TABLET BY MOUTH DAILY 07/07/15  Yes Camelia Eng Tysinger, PA-C  ondansetron (ZOFRAN) 4 MG tablet Take 1 tablet (4 mg total) by mouth every 8 (eight) hours as needed for nausea or vomiting. 05/17/15  Yes Girtha Rm, NP  potassium chloride (K-DUR,KLOR-CON) 10 MEQ tablet Take 1 tablet (10 mEq total) by mouth daily. 03/23/15  Yes Camelia Eng Tysinger, PA-C  simvastatin (ZOCOR) 20 MG tablet Take 1 tablet (20 mg total) by mouth daily. 03/23/15  Yes Camelia Eng Tysinger, PA-C  Vitamin D, Ergocalciferol, (DRISDOL) 50000 UNITS CAPS capsule Take 1 capsule (50,000 Units total) by mouth every 7 (seven) days. 03/23/15  Yes Camelia Eng Tysinger, PA-C  amoxicillin (AMOXIL) 875 MG tablet Take 1 tablet (875 mg total) by mouth 2 (two) times daily. Patient not taking:  Reported on 09/27/2015 05/21/15   Camelia Eng Tysinger, PA-C  ciprofloxacin (CIPRO) 500 MG tablet Take 1 tablet (500 mg total) by mouth 2 (two) times daily. 09/27/15   Noemi Chapel, MD  DEXILANT 60 MG capsule TAKE ONE CAPSULE BY MOUTH DAILY. Patient not taking: Reported on 09/27/2015 07/08/15   Camelia Eng Tysinger, PA-C  dicyclomine (BENTYL) 20 MG tablet Take 1 tablet (20 mg total) by mouth 2 (two) times daily. 09/27/15   Noemi Chapel, MD  ferrous fumarate (HEMOCYTE - 106 MG FE) 325 (106 FE) MG TABS tablet Take 1 tablet (106 mg of iron total) by mouth daily. Patient not taking: Reported on 09/27/2015  03/23/15   Camelia Eng Tysinger, PA-C  ibuprofen (ADVIL,MOTRIN) 800 MG tablet Take 1 tablet (800 mg total) by mouth 3 (three) times daily. Patient not taking: Reported on 09/27/2015 05/17/15   Gloriann Loan, PA-C  metroNIDAZOLE (FLAGYL) 500 MG tablet Take 1 tablet (500 mg total) by mouth 2 (two) times daily. 09/27/15   Noemi Chapel, MD  oseltamivir (TAMIFLU) 75 MG capsule Take 1 capsule (75 mg total) by mouth 2 (two) times daily. Patient not taking: Reported on 05/21/2015 05/17/15   Girtha Rm, NP   BP 152/84 mmHg  Pulse 68  Temp(Src) 98.6 F (37 C) (Oral)  Resp 18  SpO2 100%  LMP 02/23/2015 Physical Exam  Constitutional: She appears well-developed and well-nourished. No distress.  HENT:  Head: Normocephalic and atraumatic.  Mouth/Throat: Oropharynx is clear and moist. No oropharyngeal exudate.  Eyes: Conjunctivae and EOM are normal. Pupils are equal, round, and reactive to light. Right eye exhibits no discharge. Left eye exhibits no discharge. No scleral icterus.  Neck: Normal range of motion. Neck supple. No JVD present. No thyromegaly present.  Cardiovascular: Normal rate, regular rhythm, normal heart sounds and intact distal pulses.  Exam reveals no gallop and no friction rub.   No murmur heard. Pulmonary/Chest: Effort normal and breath sounds normal. No respiratory distress. She has no wheezes. She has no  rales.  Abdominal: Soft. Bowel sounds are normal. She exhibits no distension and no mass. There is no tenderness.  Musculoskeletal: Normal range of motion. She exhibits no edema or tenderness.  Lymphadenopathy:    She has no cervical adenopathy.  Neurological: She is alert. Coordination normal.  Skin: Skin is warm and dry. No rash noted. No erythema.  Psychiatric: She has a normal mood and affect. Her behavior is normal.  Nursing note and vitals reviewed.   ED Course  Procedures (including critical care time) Labs Review Labs Reviewed  COMPREHENSIVE METABOLIC PANEL  CBC  URINALYSIS, ROUTINE W REFLEX MICROSCOPIC (NOT AT [facility])  PREGNANCY, URINE    Imaging Review No results found. I have personally reviewed and evaluated these images and lab results as part of my medical decision-making.    MDM   Final diagnoses:  Left lower quadrant pain    The patient appears well, she has minimal tenderness in the infraumbilical area but no guarding masses or peritoneal signs. Vital signs are unremarkable, urinalysis pending but labs are otherwise unremarkable with no leukocytosis or renal dysfunction and no liver dysfunction. She does not want any medicines at this time, I do not know that this is a pathologic process as she does not have any signs of severe pain or distress or peritonitis. She does have diverticulosis but no diverticulitis. Urinalysis pending, imaging decision after that comes back.  Labs and ua neg, poss early divertic, doubt abscess or perf, VS stable, pt informed of results.  Meds given in ED:  Medications - No data to display  New Prescriptions   CIPROFLOXACIN (CIPRO) 500 MG TABLET    Take 1 tablet (500 mg total) by mouth 2 (two) times daily.   DICYCLOMINE (BENTYL) 20 MG TABLET    Take 1 tablet (20 mg total) by mouth 2 (two) times daily.   METRONIDAZOLE (FLAGYL) 500 MG TABLET    Take 1 tablet (500 mg total) by mouth 2 (two) times daily.       Noemi Chapel,  MD 09/27/15 [phone]

## 2015-09-27 NOTE — ED Notes (Signed)
She c/o 2 day history of L side abd pain. Onset nasuea and dry heaves today. She denies any bowel/bladder changes. Last bowel movement was Saturday. seh is alert and breathing easily

## 2015-09-27 NOTE — ED Notes (Signed)
Pt verbalized understanding of prescription use and antibiotic use and has no further questions. Pt stable and NAD.

## 2015-09-29 ENCOUNTER — Telehealth: Payer: Self-pay | Admitting: Medical

## 2015-09-29 NOTE — Telephone Encounter (Signed)
Left message for pt with person that answered the phone/ parking placard is ready

## 2015-10-01 ENCOUNTER — Telehealth: Payer: Self-pay | Admitting: Medical

## 2015-10-01 NOTE — Telephone Encounter (Signed)
After looking at form, it is not to be signed by Audelia Acton. This form is for employee only. Pt called and informed to pick up form .

## 2015-10-01 NOTE — Telephone Encounter (Signed)
Pt came in and dropped off form to be completed. Please fax to [phone] and call pt at [phone] when ready.

## 2015-10-07 ENCOUNTER — Telehealth: Payer: Self-pay

## 2015-10-07 ENCOUNTER — Ambulatory Visit (INDEPENDENT_AMBULATORY_CARE_PROVIDER_SITE_OTHER): Payer: BLUE CROSS/BLUE SHIELD | Admitting: Medical

## 2015-10-07 ENCOUNTER — Encounter: Payer: Self-pay | Admitting: Medical

## 2015-10-07 ENCOUNTER — Telehealth: Payer: Self-pay | Admitting: Medical

## 2015-10-07 VITALS — BP 128/74 | HR 68 | Wt 222.0 lb

## 2015-10-07 DIAGNOSIS — D259 Leiomyoma of uterus, unspecified: Secondary | ICD-10-CM | POA: Diagnosis not present

## 2015-10-07 DIAGNOSIS — R110 Nausea: Secondary | ICD-10-CM | POA: Diagnosis not present

## 2015-10-07 DIAGNOSIS — R109 Unspecified abdominal pain: Secondary | ICD-10-CM | POA: Diagnosis not present

## 2015-10-07 DIAGNOSIS — Z862 Personal history of diseases of the blood and blood-forming organs and certain disorders involving the immune mechanism: Secondary | ICD-10-CM | POA: Diagnosis not present

## 2015-10-07 DIAGNOSIS — R1084 Generalized abdominal pain: Secondary | ICD-10-CM

## 2015-10-07 DIAGNOSIS — C49A0 Gastrointestinal stromal tumor, unspecified site: Secondary | ICD-10-CM

## 2015-10-07 DIAGNOSIS — R21 Rash and other nonspecific skin eruption: Secondary | ICD-10-CM

## 2015-10-07 LAB — POCT URINALYSIS DIPSTICK
Bilirubin, UA: NEGATIVE
Blood, UA: NEGATIVE
Glucose, UA: NEGATIVE
Ketones, UA: NEGATIVE
Leukocytes, UA: NEGATIVE
Nitrite, UA: NEGATIVE
Protein, UA: NEGATIVE
Spec Grav, UA: 1.025
Urobilinogen, UA: 0.2
pH, UA: 6

## 2015-10-07 LAB — IRON AND TIBC
%SAT: 36 % (ref 11–50)
Iron: 92 ug/dL (ref 45–160)
TIBC: 255 ug/dL (ref 250–450)
UIBC: 163 ug/dL (ref 125–400)

## 2015-10-07 MED ORDER — HYDROCORTISONE 2.5 % RE CREA: 1.0000 "application " | TOPICAL_CREAM | Freq: Two times a day (BID) | RECTAL | Status: DC

## 2015-10-07 NOTE — Telephone Encounter (Signed)
What records would you like in particular from [facility]? They charge patients prior to records being faxed. Thanks, RLB

## 2015-10-07 NOTE — Patient Instructions (Signed)
Recommendations:  I recommend you use the Bentyl 1-2 times daily the next few days for abdominal cramping  For the next 3-4 days, use small portions, avoid spicy, greasy and fried foods, eat bland things like applesauce, toast, rice, bananas, crackers, soup to avoid heavy things on the stomach  Stop the iron for now (ferrous sulfate)  We will call back about labs for iron level  Follow up with gynecology as planned  If your abdominal symptoms don't improve in the next week or so, cal, recheck or follow up with gastroenterology  Rash - can use Hydrocortisone cream 2.5 % topically for the next week, then only prn use.  If rash continues f/u with dermatology.

## 2015-10-07 NOTE — Telephone Encounter (Signed)
Please call Dr. Ardis Hughs office (GI) to see if she is suppose to have f/u there.  She had a GIST tumor resected last year, saw Dr. Ardis Hughs, also has hx/o chronic nausea and abdominal pain.   Was recently seen in the ED for abdominal pain.   I want to make sure whether or not she was suppose to have f/u with GI.   I can't tell looking in Epic EMR.

## 2015-10-07 NOTE — Telephone Encounter (Signed)
LM with Dr Ardis Hughs RN

## 2015-10-07 NOTE — Progress Notes (Signed)
Subjective: Chief Complaint  Patient presents with  . Follow-up    lt hip pain that was unbearable for her. stomach pains as well.    Here for hospital ED f/u. Went to [facility] emergency dept on 09/27/15 for GI problems, LLQ abdominal pain, abdominal cramping.    Was told she may have early diverticulitis as she has hx/o diverticulosis and chronic nausea.  Labs from 09/27/15 reviewed and CMET was normal, UA with 7.5 pH, CBC and upreg normal, no imaging done.  Was discharged with 3 medications. She only used the pain pill (bentyl) not the antibiotic.    Currently has some nausea, "stomach trying to bother her some."  Thinks she needs something to clean her out.  In the last few days no fever, no vomiting, no diarrhea.   Last BM yesterday, seemed to be normal.  Has had some bloating, some cramping.  Only having BM when she takes miralax.    In general takes Pepcid and dexilant daily.  Takes bentyl prn, not regularly, not lately.  Takes iron daily, ferrous sulfate.     Takes mobic daily for joint pain, but takes this at bedtime.  She is suppose to be going back to OB/Gyn soon but lost there phone number.  Denies pelvic pain currently.  She notes she has breakout of the face, has seen dermatology for this, but despite the creams they gave, still continues.   No other rash, no blisters or hx/o shingles.    Past Medical History  Diagnosis Date  . Hypertension   . GERD (gastroesophageal reflux disease)   . Allergy   . Farsightedness     wears glasses, Eye care center  . History of uterine fibroid   . Chronic back pain   . Polyarthralgia     normal rheumatoid screen 01/2012  . Constipation   . Chest pain 04/05/2011    cardiac eval, normal treadmill stress test, Dr. Tollie Eth  . Anemia     iron therapy for years as of 10/12; normal Hgb 08/2013  . Hyperlipidemia   . Arthritis   . 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 [name], [facility]  . Paresthesia  09/2014    initially thought to be TIA, neurology consult in 12/2014 with other non TIA considerations.     Past Surgical History  Procedure Laterality Date  . Lipoma excision      forehead  . Knee arthroscopy Left   . Uterine fibroid surgery    . Gall stone surgery    . Colonoscopy  01/2014    diverticulosis, othwerise normal - Dr. Owens Loffler  . Esophagogastroduodenoscopy  2013    Dr. Benson Norway, gastritis  . Esophagogastroduodenoscopy  [id number]  . Eus N/A 04/16/2014    Procedure: UPPER ENDOSCOPIC ULTRASOUND (EUS) LINEAR;  Surgeon: Milus Banister, MD;  Location: [facility];  Service: Endoscopy;  Laterality: N/A;  . Laparoscopic gastric resection N/A 06/09/2014    Procedure: LAPAROSCOPIC GASTRIC MASS RESECTION;  Surgeon: [name], MD;  Location: [facility];  Service: General;  Laterality: N/A;     Objective: BP 128/74 mmHg  Pulse 68  Wt 222 lb (100.699 kg)  LMP 02/23/2015  Wt Readings from Last 3 Encounters:  10/07/15 222 lb (100.699 kg)  05/21/15 217 lb (98.431 kg)  05/17/15 223 lb (101.152 kg)   Gen: wd, wn, nad Skin - rough inflamed skin mildly, around lips, and lower cheeks, no other rash Lungs clear Heart RRR, normal s1, s2,  no murmurs Back nontender Abdomen+bs, port surgical scars noted, mildly tender in genre al, no mass, no organomegaly Ext: no edema Pulses normal    Assessment: Encounter Diagnoses  Name Primary?  . Abdominal pain, generalized Yes  . Abdominal cramping   . Rash of face   . History of anemia   . Chronic nausea   . Gastrointestinal stromal tumor (GIST)   . Uterine leiomyoma, unspecified location      Plan: abdominal pain - doesn't appear in pain today.  Only mildly tender on exam.  She has chronic nausea, chronic abdominal pain, had gastric mass resected last year, saw Dr. Ardis Hughs GI and surgeon Dr. Rosendo Gros for this.   unfortunately I don't see the records scanned in the computer from her specialist although I had seen them prior.     Recommended she use the Bentyl given by the ED, no obvious sign of diverticulitis so hold off on the Cipro and flagyl for now.  She never started those antibiotics.  discussed small portions and brat diet the next few days.   Reassess need for iron therapy at this time, labs today.  Consider f/u with GI.   reviewed the recent 09/27/15 labs and ED visit notes.    Rash - can use Hydrocortisone cream 2.5 % topically for the next week, then only prn use.  If rash continues f/u with dermatology.    Hx/o anemia - recheck iron level today.   recent CBC was normal  Chronic nausea - sees GI for this  Hx/o GIST tumor - will request GI records  fibroids - f/u with gynecology.  Advised she use the mobic prn, not daily.  Advised she take this in the day on the days she uses it, not bedtime.  F/u pending lab.

## 2015-10-07 NOTE — Telephone Encounter (Signed)
They charge to send records we are requesting?  When did they start doing that?  Maybe they should get on Epic and we wound have to ask.   I referred her there, so as a courtesy they should have sent the last 1 or 2 office notes from where she was last seen.   In the future I can certainly refer elsewhere

## 2015-10-08 ENCOUNTER — Other Ambulatory Visit: Payer: Self-pay | Admitting: Medical

## 2015-10-08 ENCOUNTER — Telehealth: Payer: Self-pay | Admitting: Medical

## 2015-10-08 MED ORDER — FERROUS FUMARATE 325 (106 FE) MG PO TABS: 1.0000 | ORAL_TABLET | Freq: Every day | ORAL | Status: DC

## 2015-10-08 NOTE — Telephone Encounter (Signed)
[facility] is the place that charges for medical records. Thank you, RLB

## 2015-10-12 NOTE — Telephone Encounter (Signed)
Approved 10/12/2015- 10/11/2016, Dexilant for 90 day supply with insurance and coupon at [place] is $126.32. Pt notified and would like to pick up the [place] in 30 day supplies. Advised her to notify the pharmacy of 30 days vs 90 days. Encouraged pt to call the office if cost is too expensive for alternatives. She is agreeable.

## 2015-10-18 ENCOUNTER — Telehealth: Payer: Self-pay | Admitting: Medical

## 2015-10-18 NOTE — Telephone Encounter (Signed)
Pt came in and dropped off FMLA paper to be filled out. She states the previous one expired. Pt is requesting completed forms to be fax and then pt needs to be called at [phone].

## 2015-10-19 ENCOUNTER — Telehealth: Payer: Self-pay | Admitting: Gastroenterology

## 2015-10-19 NOTE — Telephone Encounter (Signed)
Spoke with Evelena Peat and she stated that the nurse has been out but she will put a message in for the other nurses and one of them will call me back

## 2015-10-19 NOTE — Telephone Encounter (Signed)
I spoke with Lisa Briggs.  They are asking if she was to have follow up for GIST tumor.  Per notes I can't see that she was to come back here.  She was referred to Dr. Rosendo Gros at [facility] and she had surgery for removal last March.  She is due for colonoscopy recall for 2020.  They did not want to schedule an office visit at this point.

## 2015-10-19 NOTE — Telephone Encounter (Signed)
Spoke with Lisa Briggs and she stated that her colonoscopy is not due until 2020. Other than that it does not look that she was supposed to return unless our office would be referring her for something

## 2015-10-21 ENCOUNTER — Telehealth: Payer: Self-pay | Admitting: Medical

## 2015-10-21 NOTE — Telephone Encounter (Signed)
Left message for pt to call back  °

## 2015-10-21 NOTE — Telephone Encounter (Signed)
I received FMLA request.  I just did one of these in March.   I shouldn't have to do another one now.   In the future, always ask what condition is the FMLA for, so when you talkt to her, clarify this.

## 2015-10-22 NOTE — Telephone Encounter (Signed)
Pt called back and stated that the current fmla is for stomach issues. She stated that the one on file for that issue expired. She would like the new fmla to list all current concerns.

## 2015-10-22 NOTE — Telephone Encounter (Signed)
Left message for pt to call back  °

## 2015-10-25 ENCOUNTER — Telehealth: Payer: Self-pay | Admitting: Medical

## 2015-10-25 NOTE — Telephone Encounter (Signed)
LMTCB

## 2015-10-25 NOTE — Telephone Encounter (Signed)
The purpose of my note wasn't to ask you to address this at the window.   Maybe I should have sent this to [facility].  The point is, she has an FMLA active.  I don't know why I need to update another one.   Aren't these things good for 6 months or is it 12 months?   You can send to [place] if needed, but I'm not sure who knows the particulars about expiration on FMLA forms.

## 2015-10-25 NOTE — Telephone Encounter (Signed)
-----   Message from Willy Eddy sent at 10/22/2015  9:06 AM EDT ----- Regarding: fmla form Lisa Briggs per message this pt stated that her fmla filled out had expired. When pt's drop off forms in person it is difficult to address any personal information at the window. Our waiting room does not allow for a lot of verbal communication concerning a pt's personal health information. This would be a violation of her HIPAA rights. It is my understating that it is more appropriate to have your medical assistant call pt and get any additional medical information that may be needed. Addressing medical concerns with a pt at the window has always been an issue for the upfront staff and we have always been advised by Beverlee Nims to use caution. Just wanted to let you know how we handle things.

## 2015-10-27 ENCOUNTER — Telehealth: Payer: Self-pay | Admitting: Medical

## 2015-10-27 NOTE — Telephone Encounter (Signed)
LMTCB

## 2015-10-27 NOTE — Telephone Encounter (Signed)
FMLA paperwork has been completed.  Please SCAN copy for [place].  Return form to patient or employer as requested.    Charge the customary fee for FMLA form completion.FMLA paperwork has been completed.  Please SCAN copy for [place].  Return form to patient or employer as requested.

## 2015-11-02 ENCOUNTER — Telehealth: Payer: Self-pay

## 2015-11-02 NOTE — Telephone Encounter (Signed)
Last OV note rcvd from [facility]. No recent paps per [facility]. Placed in your folder for review. Victorino December

## 2015-11-02 NOTE — Telephone Encounter (Signed)
Have not been able to reach pt.

## 2015-11-24 ENCOUNTER — Encounter: Payer: Self-pay | Admitting: Obstetrics & Gynecology

## 2015-12-07 ENCOUNTER — Other Ambulatory Visit: Payer: Self-pay | Admitting: Medical

## 2015-12-16 ENCOUNTER — Encounter: Payer: Self-pay | Admitting: Medical

## 2016-01-10 ENCOUNTER — Other Ambulatory Visit: Payer: Self-pay | Admitting: Medical

## 2016-01-13 ENCOUNTER — Other Ambulatory Visit: Payer: Self-pay | Admitting: Medical

## 2016-01-13 DIAGNOSIS — R110 Nausea: Secondary | ICD-10-CM

## 2016-01-14 NOTE — Telephone Encounter (Signed)
Lisa Briggs is this okay to refill

## 2016-01-17 ENCOUNTER — Other Ambulatory Visit: Payer: BLUE CROSS/BLUE SHIELD | Admitting: Obstetrics & Gynecology

## 2016-01-26 ENCOUNTER — Ambulatory Visit: Payer: BLUE CROSS/BLUE SHIELD | Admitting: Medical

## 2016-01-27 ENCOUNTER — Ambulatory Visit (INDEPENDENT_AMBULATORY_CARE_PROVIDER_SITE_OTHER): Payer: BLUE CROSS/BLUE SHIELD | Admitting: Family Medicine

## 2016-01-27 ENCOUNTER — Encounter: Payer: Self-pay | Admitting: Family Medicine

## 2016-01-27 VITALS — BP 120/80 | HR 90 | Wt 230.8 lb

## 2016-01-27 DIAGNOSIS — M5416 Radiculopathy, lumbar region: Secondary | ICD-10-CM | POA: Diagnosis not present

## 2016-01-27 DIAGNOSIS — G8929 Other chronic pain: Secondary | ICD-10-CM | POA: Diagnosis not present

## 2016-01-27 NOTE — Patient Instructions (Signed)
Dr Posey Pronto is at [facility] [place] Phone # [phone]  Try taking the Gabapentin 300 mg twice daily for the next week and get back in to see [name] or Dr. Posey Pronto.

## 2016-01-27 NOTE — Progress Notes (Signed)
Subjective:    Patient ID: Lisa Briggs, female    DOB: 09-05-1960, 55 y.o.   MRN: [id number]  HPI Chief Complaint  Patient presents with  . pain    pain down both legs and numb in right foot   She is here with complaints of low back pain and shooting pain down the back of both legs intermittently but right is worse than left.  She also reports numbness to her right foot mainly the 4th and 5th digits, and pain and soreness to right lower leg. States pain is worse with weightbearing and walking and slightly relieved with rest. Pain is chronic in nature and unchanged.  She reports taking gabapentin 300 mg in the evening only. She saw Dr. Posey Pronto in January 2017 and was told to take Gabapentin 300 mg twice daily for severe radicular pain per the chart that she has not done this.  Denies fever, chills, weakness, chest pain, shortness of breath, abdominal pain, nausea, vomiting, diarrhea. No saddle anesthesia. No loss of control of bowels or bladder. Denies calf pain, tenderness.    Past Medical History:  Diagnosis Date  . Allergy   . Anemia    iron therapy for years as of 10/12; normal Hgb 08/2013  . Arthritis   . Chest pain 04/05/2011   cardiac eval, normal treadmill stress test, Dr. Tollie Eth  . Chronic back pain   . Constipation   . Farsightedness    wears glasses, [facility]  . 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 [name], [facility]  . GERD (gastroesophageal reflux disease)   . History of uterine fibroid   . Hyperlipidemia   . Hypertension   . Paresthesia 09/2014   initially thought to be TIA, neurology consult in 12/2014 with other non TIA considerations.    . Polyarthralgia    normal rheumatoid screen 01/2012   Past Surgical History:  Procedure Laterality Date  . COLONOSCOPY  01/2014   diverticulosis, othwerise normal - Dr. Owens Loffler  . ESOPHAGOGASTRODUODENOSCOPY  2013   Dr. Benson Norway, gastritis  .  ESOPHAGOGASTRODUODENOSCOPY  [id number]  . EUS N/A 04/16/2014   Procedure: UPPER ENDOSCOPIC ULTRASOUND (EUS) LINEAR;  Surgeon: Milus Banister, MD;  Location: [facility];  Service: Endoscopy;  Laterality: N/A;  . gall stone surgery    . KNEE ARTHROSCOPY Left   . LAPAROSCOPIC GASTRIC RESECTION N/A 06/09/2014   Procedure: LAPAROSCOPIC GASTRIC MASS RESECTION;  Surgeon: [name], MD;  Location: [facility] ORS;  Service: General;  Laterality: N/A;  . LIPOMA EXCISION     forehead  . UTERINE FIBROID SURGERY     Reviewed allergies, medications, past medical, surgical,and social history.   MRI lumbar spine wo contrast 05/07/2014:  1. Multilevel disc disease and facet disease. 2. Multilevel lateral recess encroachment. 3. Mild bilateral foraminal encroachment at L2-3 and L3-4. 4. Mild left foraminal stenosis at L4-5. 5. Shallow broad-based right foraminal disc protrusion at L5-S1.   Review of Systems Pertinent positives and negatives in the history of present illness.     Objective:   Physical Exam  Constitutional: She is oriented to person, place, and time. She appears well-developed and well-nourished. No distress.  Neck: Normal range of motion. Neck supple.  Cardiovascular: Normal rate, regular rhythm, normal heart sounds and intact distal pulses.   Pulmonary/Chest: Effort normal and breath sounds normal.  Musculoskeletal:       Thoracic back: Normal.       Lumbar back: Normal.  Right lower leg: Normal.       Left lower leg: Normal.       Right foot: Normal.       Left foot: Normal.  Neurological: She is alert and oriented to person, place, and time. She has normal strength. No cranial nerve deficit or sensory deficit. Coordination and gait normal.  Reflex Scores:      Patellar reflexes are 2+ on the right side and 2+ on the left side.      Achilles reflexes are 1+ on the right side and 1+ on the left side. Skin: Skin is warm and dry. No pallor.  Psychiatric: She has a normal  mood and affect. Her speech is normal and behavior is normal. Thought content normal.   BP 120/80   Pulse 90   Wt 230 lb 12.8 oz (104.7 kg)   LMP 02/23/2015   BMI 34.58 kg/m      Assessment & Plan:  Chronic radicular lumbar pain  Discussed that her neurological exam is normal. Also discussed red flags for back pain and neurological symptoms. It appears that her pain is unchanged since January when she saw the neurologist. Patient agrees that this is the case. Reviewed MRI of the lumbar spine from February 2016. Also reviewed Dr. Serita Grit note from January regarding radicular lumbar pain and right lower extremity discomfort and numbness. Patient states she is not aware that she could take gabapentin twice daily for severe spells of pain. I recommend that she take the gabapentin 300 mg twice daily for the next week and see if she notices any improvement and follow up with either her PCP, [name] or Dr. Posey Pronto, neurologist. She was given Dr. Serita Grit phone number and address and plans to contact them for a future appointment.

## 2016-02-01 ENCOUNTER — Telehealth: Payer: Self-pay

## 2016-02-01 ENCOUNTER — Encounter: Payer: Self-pay | Admitting: Medical

## 2016-02-01 MED ORDER — DEXLANSOPRAZOLE 30 MG PO CPDR: 30.0000 mg | DELAYED_RELEASE_CAPSULE | Freq: Every day | ORAL | 0 refills | Status: DC

## 2016-02-01 NOTE — Telephone Encounter (Signed)
Give patient samples of dexilant 2 boxes of 30mg  per vickie take 2 caps. To give her 60mg  . Pt was wait on her mail order to come in

## 2016-02-02 NOTE — Telephone Encounter (Signed)
Pt was given samples 

## 2016-02-17 ENCOUNTER — Ambulatory Visit (INDEPENDENT_AMBULATORY_CARE_PROVIDER_SITE_OTHER): Payer: BLUE CROSS/BLUE SHIELD | Admitting: Medical

## 2016-02-17 ENCOUNTER — Encounter: Payer: Self-pay | Admitting: Medical

## 2016-02-17 VITALS — BP 110/82 | HR 88 | Temp 98.0°F | Wt 227.0 lb

## 2016-02-17 DIAGNOSIS — J3489 Other specified disorders of nose and nasal sinuses: Secondary | ICD-10-CM

## 2016-02-17 DIAGNOSIS — R110 Nausea: Secondary | ICD-10-CM

## 2016-02-17 DIAGNOSIS — J029 Acute pharyngitis, unspecified: Secondary | ICD-10-CM | POA: Diagnosis not present

## 2016-02-17 LAB — POCT RAPID STREP A (OFFICE): Rapid Strep A Screen: NEGATIVE

## 2016-02-17 MED ORDER — ONDANSETRON HCL 4 MG PO TABS: ORAL_TABLET | ORAL | 0 refills | Status: DC

## 2016-02-17 MED ORDER — AMOXICILLIN 500 MG PO CAPS: 500.0000 mg | ORAL_CAPSULE | Freq: Three times a day (TID) | ORAL | 0 refills | Status: DC

## 2016-02-17 NOTE — Progress Notes (Signed)
Subjective: Chief Complaint  Patient presents with  . stomach pain    stomach pain, boby ache, burning in her throat    Here for not feeling well.   Started with bad sore throat 2 days ago, had burning in throat eating yesterday.  Having some cough, sweats, body aches.   Having bilateral ear pain.   Has sinus pressure, some blood tinged mucous from nose.   Having some sneezing.  No runny nose.   No fever.   No vomiting.  +decreased appetite.   No sick contacts.   Using nothing for symptoms.  No other aggravating or relieving factors. No other complaint.  Past Medical History:  Diagnosis Date  . Allergy   . Anemia    iron therapy for years as of 10/12; normal Hgb 08/2013  . Arthritis   . Chest pain 04/05/2011   cardiac eval, normal treadmill stress test, Dr. Tollie Eth  . Chronic back pain   . Constipation   . Farsightedness    wears glasses, [facility] care center  . 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 [name], [facility]  . GERD (gastroesophageal reflux disease)   . History of uterine fibroid   . Hyperlipidemia   . Hypertension   . Paresthesia 09/2014   initially thought to be TIA, neurology consult in 12/2014 with other non TIA considerations.    . Polyarthralgia    normal rheumatoid screen 01/2012   Current Outpatient Prescriptions on File Prior to Visit  Medication Sig Dispense Refill  . aspirin EC 81 MG tablet Take 1 tablet (81 mg total) by mouth daily. 90 tablet 3  . cyclobenzaprine (FLEXERIL) 10 MG tablet TAKE ONE TABLET BY MOUTH TWICE DAILY AS NEEDED FOR  MUSCLE  SPASM 30 tablet 0  . DEXILANT 60 MG capsule TAKE ONE CAPSULE BY MOUTH DAILY. 90 capsule 0  . Dexlansoprazole (DEXILANT) 30 MG capsule Take 1 capsule (30 mg total) by mouth daily. 14 capsule 0  . famotidine (PEPCID) 20 MG tablet Take 1 tablet (20 mg total) by mouth at bedtime. 90 tablet 3  . fluticasone (FLONASE) 50 MCG/ACT nasal spray USE ONE SPRAY(S) IN EACH NOSTRIL DAILY AS  NEEDED FOR ALLERGIES OR RHINITIS. 16 g 11  . gabapentin (NEURONTIN) 300 MG capsule Take 1 capsule (300 mg total) by mouth at bedtime. 90 capsule 3  . hydrocortisone (ANUSOL-HC) 2.5 % rectal cream Place 1 application rectally 2 (two) times daily. 30 g 0  . losartan-hydrochlorothiazide (HYZAAR) 50-12.5 MG tablet Take 1 tablet by mouth daily. 90 tablet 3  . meloxicam (MOBIC) 15 MG tablet TAKE ONE TABLET BY MOUTH ONCE DAILY 90 tablet 0  . ondansetron (ZOFRAN) 4 MG tablet TAKE ONE TABLET BY MOUTH EVERY 8 HOURS AS NEEDED FOR NAUSEA OR VOMITING 20 tablet 2  . potassium chloride (K-DUR,KLOR-CON) 10 MEQ tablet Take 1 tablet (10 mEq total) by mouth daily. 90 tablet 3  . simvastatin (ZOCOR) 20 MG tablet Take 1 tablet (20 mg total) by mouth daily. 90 tablet 3  . Vitamin D, Ergocalciferol, (DRISDOL) 50000 units CAPS capsule TAKE 1 CAPSULE BY MOUTH EVERY 7 DAYS 4 capsule 5  . dicyclomine (BENTYL) 20 MG tablet Take 1 tablet (20 mg total) by mouth 2 (two) times daily. 20 tablet 0  . EQ LORATADINE 10 MG tablet TAKE ONE TABLET BY MOUTH ONCE DAILY 30 tablet 1  . FERROCITE 324 MG TABS tablet TAKE ONE TABLET BY MOUTH TWICE DAILY (Patient not taking: Reported on  02/17/2016) 60 tablet 0  . ferrous fumarate (HEMOCYTE - 106 MG FE) 325 (106 Fe) MG TABS tablet Take 1 tablet (106 mg of iron total) by mouth daily. (Patient not taking: Reported on 02/17/2016) 90 tablet 1   No current facility-administered medications on file prior to visit.    ROS as in subjective   Objective: BP 110/82   Pulse 88   Temp 98 F (36.7 C)   Wt 227 lb (103 kg)   LMP 02/23/2015   SpO2 95%   BMI 34.01 kg/m   General appearance: no distress, WD/WN, somewhat ill-appearing HEENT: normocephalic, conjunctiva/corneas normal, sclerae anicteric, nares patent, no discharge or erythema, pharynx with erythema, no exudate.  Oral cavity: MMM, no lesions  Neck: supple, shoddy tender anterior nodes, no thyromegaly Lungs: CTA bilaterally, no wheezes,  rhonchi, or rales Abdomen: +bs, soft, non tender, non distended, no masses, no hepatomegaly, no splenomegaly  Laboratory Strep test done. Results:negative.   Assessment: Encounter Diagnoses  Name Primary?  . Sore throat Yes  . Sinus pressure   . Nausea      Plan: Discussed symptoms, exam findings, recommendations  Patient Instructions  Recommendations  Your throat swab doesn't show strep  I suspect this is a viral infection/throat infection  You can use OTC Mucinex DM or benadryl or both.   Hydrate well with water  Use salt water gargles, warm fluids such as tea and coffee to sooth pain in throat  You can use Ibuprofen OTC 3 tablets two to three times daily for pain this week  You can use Zofran for nausea  If worse by Sunday such as fever, much worse facial pain, swelling in back of throat, then begin Amoxicillin  But for now, symptoms appear viral and it will take several days to see improvement  Lainey was seen today for stomach pain.  Diagnoses and all orders for this visit:  Sore throat -     Rapid Strep A  Sinus pressure  Nausea  Chronic nausea -     ondansetron (ZOFRAN) 4 MG tablet; TAKE ONE TABLET BY MOUTH EVERY 8 HOURS AS NEEDED FOR NAUSEA OR VOMITING  Other orders -     amoxicillin (AMOXIL) 500 MG capsule; Take 1 capsule (500 mg total) by mouth 3 (three) times daily.

## 2016-02-17 NOTE — Patient Instructions (Addendum)
Recommendations  Your throat swab doesn't show strep  I suspect this is a viral infection/throat infection  You can use OTC Mucinex DM or benadryl or both.   Hydrate well with water  Use salt water gargles, warm fluids such as tea and coffee to sooth pain in throat  You can use Ibuprofen OTC 3 tablets two to three times daily for pain this week  You can use Zofran for nausea  If worse by Sunday such as fever, much worse facial pain, swelling in back of throat, then begin Amoxicillin  But for now, symptoms appear viral and it will take several days to see improvement

## 2016-03-22 ENCOUNTER — Other Ambulatory Visit: Payer: Self-pay | Admitting: Medical

## 2016-03-22 NOTE — Telephone Encounter (Signed)
Is this okay to refill? 

## 2016-03-25 ENCOUNTER — Other Ambulatory Visit: Payer: Self-pay | Admitting: Medical

## 2016-03-28 NOTE — Telephone Encounter (Signed)
Can she have a refill on this 

## 2016-03-29 ENCOUNTER — Telehealth: Payer: Self-pay | Admitting: Medical

## 2016-03-29 NOTE — Telephone Encounter (Signed)
Pt called and stated that her parking placard has expired and she would like a new one completed. Sending back to be completed and signed off by [employer]. Please call pt at [phone] when ready.

## 2016-04-07 ENCOUNTER — Ambulatory Visit (HOSPITAL_COMMUNITY)
Admission: EM | Admit: 2016-04-07 | Discharge: 2016-04-07 | Disposition: A | Discharge status: Home / Self Care | Payer: BLUE CROSS/BLUE SHIELD | Attending: Emergency Medicine | Admitting: Emergency Medicine

## 2016-04-07 ENCOUNTER — Encounter (HOSPITAL_COMMUNITY): Payer: Self-pay | Admitting: Emergency Medicine

## 2016-04-07 DIAGNOSIS — J0140 Acute pansinusitis, unspecified: Secondary | ICD-10-CM

## 2016-04-07 MED ORDER — AMOXICILLIN-POT CLAVULANATE 875-125 MG PO TABS: 1.0000 | ORAL_TABLET | Freq: Two times a day (BID) | ORAL | 0 refills | Status: DC

## 2016-04-07 NOTE — ED Provider Notes (Signed)
HPI  SUBJECTIVE:  Lisa Briggs is a 56 y.o. female who presents with 2 weeks of sinus pain and pressure, nasal "soreness" and occasional epistaxis when she blows her nose. She reports purulent nasal drainage, postnasal drip, and occasional cough productive of the same material as her nasal congestion. She also reports a right-sided earache that radiates down her neck for the past week. She denies hearing changes or otorrhea. She states that her ear pops when she chews, but she denies any popping or clicking of her jaw. She denies fevers, sneezing, itchy, watery eyes. No antipyretic in the past 6-8 hours. She tried  Flonase, ibuprofen 200 mg, cough drops and orange juice. No alleviating factors. Nasal congestion is worse with lying down.. She has a past medical history including TIA, hypertension, allergic rhinitis. She states that the symptoms identical to previous sinus infection that she had last month which was treated with amoxicillin. She denies any history of seasonal allergy. [id number], DAVID Audelia Acton, PA-C     Past Medical History:  Diagnosis Date  . Allergy   . Anemia    iron therapy for years as of 10/12; normal Hgb 08/2013  . Arthritis   . Chest pain 04/05/2011   cardiac eval, normal treadmill stress test, Dr. Tollie Eth  . Chronic back pain   . Constipation   . Farsightedness    wears glasses, Eye care center  . Gastrointestinal stromal tumor (GIST) ([place] [address]) 06/2014   Dr. [name], [facility]  . GERD (gastroesophageal reflux disease)   . History of uterine fibroid   . Hyperlipidemia   . Hypertension   . Paresthesia 09/2014   initially thought to be TIA, neurology consult in 12/2014 with other non TIA considerations.    . Polyarthralgia    normal rheumatoid screen 01/2012    Past Surgical History:  Procedure Laterality Date  . COLONOSCOPY  01/2014   diverticulosis, othwerise normal - Dr. Owens Loffler  . ESOPHAGOGASTRODUODENOSCOPY  2013   Dr. Benson Norway,  gastritis  . ESOPHAGOGASTRODUODENOSCOPY  [id number]  . EUS N/A 04/16/2014   Procedure: UPPER ENDOSCOPIC ULTRASOUND (EUS) LINEAR;  Surgeon: Milus Banister, MD;  Location: [facility];  Service: Endoscopy;  Laterality: N/A;  . gall stone surgery    . KNEE ARTHROSCOPY Left   . LAPAROSCOPIC GASTRIC RESECTION N/A 06/09/2014   Procedure: LAPAROSCOPIC GASTRIC MASS RESECTION;  Surgeon: [name], MD;  Location: [facility];  Service: General;  Laterality: N/A;  . LIPOMA EXCISION     forehead  . UTERINE FIBROID SURGERY      Family History  Problem Relation Age of Onset  . Hypertension Mother   . Arthritis Mother   . GER disease Mother   . Glaucoma Mother   . Stroke Father   . Alcohol abuse Father   . Breast cancer Sister     breast cancer dx late 67s; another sister with leukemia  . Hypertension Sister   . Heart disease Sister     heart murmur; other sisters with heart problems  . Diabetes Brother   . Colon cancer Brother 63  . Diabetes Maternal Aunt   . Heart disease Maternal Aunt   . Heart disease Maternal Grandmother   . Heart disease Maternal Grandfather   . Heart disease Maternal Uncle   . Stroke Maternal Uncle   . Colon polyps Neg Hx     Social History  Substance Use Topics  . Smoking status: Never Smoker  . Smokeless tobacco: Never Used  . Alcohol  use No    No current facility-administered medications for this encounter.   Current Outpatient Prescriptions:  .  aspirin EC 81 MG tablet, Take 1 tablet (81 mg total) by mouth daily., Disp: 90 tablet, Rfl: 3 .  DEXILANT 60 MG capsule, TAKE ONE CAPSULE BY MOUTH DAILY., Disp: 90 capsule, Rfl: 0 .  EQ LORATADINE 10 MG tablet, TAKE ONE TABLET BY MOUTH ONCE DAILY, Disp: 30 tablet, Rfl: 1 .  famotidine (PEPCID) 20 MG tablet, Take 1 tablet (20 mg total) by mouth at bedtime., Disp: 90 tablet, Rfl: 3 .  FERROCITE 324 MG TABS tablet, TAKE ONE TABLET BY MOUTH TWICE DAILY, Disp: 60 tablet, Rfl: 0 .  ferrous fumarate (HEMOCYTE - 106 MG FE)  325 (106 Fe) MG TABS tablet, Take 1 tablet (106 mg of iron total) by mouth daily., Disp: 90 tablet, Rfl: 1 .  fluticasone (FLONASE) 50 MCG/ACT nasal spray, USE ONE SPRAY(S) IN EACH NOSTRIL DAILY AS NEEDED FOR ALLERGIES OR RHINITIS., Disp: 16 g, Rfl: 11 .  gabapentin (NEURONTIN) 300 MG capsule, Take 1 capsule (300 mg total) by mouth at bedtime., Disp: 90 capsule, Rfl: 3 .  losartan-hydrochlorothiazide (HYZAAR) 50-12.5 MG tablet, Take 1 tablet by mouth daily., Disp: 90 tablet, Rfl: 3 .  meloxicam (MOBIC) 15 MG tablet, TAKE ONE TABLET BY MOUTH ONCE DAILY, Disp: 90 tablet, Rfl: 0 .  ondansetron (ZOFRAN) 4 MG tablet, TAKE ONE TABLET BY MOUTH EVERY 8 HOURS AS NEEDED FOR NAUSEA OR VOMITING, Disp: 90 tablet, Rfl: 0 .  polyethylene glycol powder (GLYCOLAX/MIRALAX) powder, TAKE ONE CAPFUL (17 GRAMS) BY MOUTH DAILY, Disp: 527 g, Rfl: 3 .  potassium chloride (K-DUR,KLOR-CON) 10 MEQ tablet, Take 1 tablet (10 mEq total) by mouth daily., Disp: 90 tablet, Rfl: 3 .  simvastatin (ZOCOR) 20 MG tablet, Take 1 tablet (20 mg total) by mouth daily., Disp: 90 tablet, Rfl: 3 .  Vitamin D, Ergocalciferol, (DRISDOL) 50000 units CAPS capsule, TAKE 1 CAPSULE BY MOUTH EVERY 7 DAYS, Disp: 4 capsule, Rfl: 5 .  amoxicillin-clavulanate (AUGMENTIN) 875-125 MG tablet, Take 1 tablet by mouth 2 (two) times daily. X 7 days, Disp: 14 tablet, Rfl: 0 .  cyclobenzaprine (FLEXERIL) 10 MG tablet, TAKE ONE TABLET BY MOUTH TWICE DAILY AS NEEDED FOR  MUSCLE  SPASM, Disp: 30 tablet, Rfl: 0 .  Dexlansoprazole (DEXILANT) 30 MG capsule, Take 1 capsule (30 mg total) by mouth daily., Disp: 14 capsule, Rfl: 0 .  dicyclomine (BENTYL) 20 MG tablet, Take 1 tablet (20 mg total) by mouth 2 (two) times daily., Disp: 20 tablet, Rfl: 0  Allergies  Allergen Reactions  . Kiwi Extract Hives and Itching  . Mucinex Dm [Dm-Guaifenesin Er] Nausea Only    Messes up her stomach. Pt report on 01/21/15  . Peach [Prunus Persica] Itching    Peach peeling makes pt itch      ROS  As noted in HPI.   Physical Exam  BP 138/72 (BP Location: Left Arm)   Pulse 68   Temp 97.7 F (36.5 C) (Oral)   Resp 18   LMP 02/23/2015   SpO2 100%   Constitutional: Well developed, well nourished, no acute distress Eyes:  EOMI, conjunctiva normal bilaterally HENT: Normocephalic, atraumatic,mucus membranes moist. TMs normal bilaterally. No TMJ tenderness or crepitus. Positive right frontal and bilateral maxillary sinus tenderness. Positive purulent and bloody nasal congestion, erythematous, swollen turbinates. Normal oropharynx. Positive postnasal drip. Neck: No cervical lymphadenopathy Respiratory: Normal inspiratory effort lungs clear bilaterally good air movement Cardiovascular: Normal rate regular  rhythm no murmurs rubs gallops  GI: nondistended skin: No rash, skin intact Musculoskeletal: no deformities Neurologic: Alert & oriented x 3, no focal neuro deficits Psychiatric: Speech and behavior appropriate   ED Course   Medications - No data to display  No orders of the defined types were placed in this encounter.   No results found for this or any previous visit (from the past 24 hour(s)). No results found.  ED Clinical Impression  Acute pansinusitis, recurrence not specified  ED Assessment/Plan  Presentation most consistent with sinusitis and given her duration of symptoms, she meets criteria for antibiotics. We'll send home with Augmentin. She is to start regular Mucinex, saline nasal irrigation, Flonase. Vaseline or bacitracin in her nose as needed for epistaxis-  this is most likely irritation from her Flonase She is to continue Mobic that is rx'd to her. Advised patient to not take ibuprofen and Mobic. She will follow-up with her primary care physician in several days, to the ER if she gets worse.  Discussed MDM, plan and followup with patient Discussed sn/sx that should prompt return to the ED. Patient  agrees with plan.   Meds ordered this  encounter  Medications  . amoxicillin-clavulanate (AUGMENTIN) 875-125 MG tablet    Sig: Take 1 tablet by mouth 2 (two) times daily. X 7 days    Dispense:  14 tablet    Refill:  0    *This clinic note was created using [employer]. Therefore, there may be occasional mistakes despite careful proofreading.  ?   Melynda Ripple, MD 04/07/16 [phone]

## 2016-04-07 NOTE — ED Triage Notes (Signed)
Here for cold sx onset 1 week associated w/epistaxis, facial pressure, nasal congestion/drainage, right ear pain, HA, prod cough, BA  Denies fevers  A&O x4... NAD

## 2016-04-07 NOTE — Discharge Instructions (Signed)
Take the medication as written. Use a neti pot or the NeilMed sinus rinse as often as you want to to reduce nasal congestion. Follow the directions on the box.   Go to www.goodrx.com to look up your medications. This will give you a list of where you can find your prescriptions at the most affordable prices.

## 2016-04-13 ENCOUNTER — Other Ambulatory Visit: Payer: Self-pay | Admitting: Medical

## 2016-04-13 DIAGNOSIS — Z1231 Encounter for screening mammogram for malignant neoplasm of breast: Secondary | ICD-10-CM

## 2016-04-15 ENCOUNTER — Other Ambulatory Visit: Payer: Self-pay | Admitting: Medical

## 2016-04-15 DIAGNOSIS — R110 Nausea: Secondary | ICD-10-CM

## 2016-04-15 DIAGNOSIS — K2970 Gastritis, unspecified, without bleeding: Secondary | ICD-10-CM

## 2016-04-17 ENCOUNTER — Other Ambulatory Visit: Payer: Self-pay | Admitting: Medical

## 2016-04-20 ENCOUNTER — Ambulatory Visit: Payer: BLUE CROSS/BLUE SHIELD

## 2016-04-20 ENCOUNTER — Ambulatory Visit
Admission: RE | Admit: 2016-04-20 | Discharge: 2016-04-20 | Disposition: A | Discharge status: Home / Self Care | Payer: BLUE CROSS/BLUE SHIELD | Source: Ambulatory Visit | Attending: Medical | Admitting: Medical

## 2016-04-20 ENCOUNTER — Ambulatory Visit (INDEPENDENT_AMBULATORY_CARE_PROVIDER_SITE_OTHER): Payer: BLUE CROSS/BLUE SHIELD | Admitting: Medical

## 2016-04-20 ENCOUNTER — Encounter: Payer: Self-pay | Admitting: Family Medicine

## 2016-04-20 ENCOUNTER — Other Ambulatory Visit (HOSPITAL_COMMUNITY)
Admission: RE | Admit: 2016-04-20 | Discharge: 2016-04-20 | Disposition: A | Discharge status: Home / Self Care | Payer: BLUE CROSS/BLUE SHIELD | Source: Ambulatory Visit | Attending: Family Medicine | Admitting: Family Medicine

## 2016-04-20 ENCOUNTER — Encounter: Payer: Self-pay | Admitting: Medical

## 2016-04-20 VITALS — BP 130/80 | HR 76 | Temp 97.8°F | Resp 18 | Ht 69.0 in | Wt 228.4 lb

## 2016-04-20 DIAGNOSIS — R202 Paresthesia of skin: Secondary | ICD-10-CM

## 2016-04-20 DIAGNOSIS — E559 Vitamin D deficiency, unspecified: Secondary | ICD-10-CM

## 2016-04-20 DIAGNOSIS — R109 Unspecified abdominal pain: Secondary | ICD-10-CM

## 2016-04-20 DIAGNOSIS — K5900 Constipation, unspecified: Secondary | ICD-10-CM

## 2016-04-20 DIAGNOSIS — Z8509 Personal history of malignant neoplasm of other digestive organs: Secondary | ICD-10-CM | POA: Diagnosis not present

## 2016-04-20 DIAGNOSIS — R209 Unspecified disturbances of skin sensation: Secondary | ICD-10-CM

## 2016-04-20 DIAGNOSIS — Z1231 Encounter for screening mammogram for malignant neoplasm of breast: Secondary | ICD-10-CM | POA: Diagnosis not present

## 2016-04-20 DIAGNOSIS — R7301 Impaired fasting glucose: Secondary | ICD-10-CM

## 2016-04-20 DIAGNOSIS — M8589 Other specified disorders of bone density and structure, multiple sites: Secondary | ICD-10-CM | POA: Diagnosis not present

## 2016-04-20 DIAGNOSIS — Z780 Asymptomatic menopausal state: Secondary | ICD-10-CM | POA: Diagnosis not present

## 2016-04-20 DIAGNOSIS — Z0000 Encounter for general adult medical examination without abnormal findings: Secondary | ICD-10-CM

## 2016-04-20 DIAGNOSIS — J309 Allergic rhinitis, unspecified: Secondary | ICD-10-CM

## 2016-04-20 DIAGNOSIS — R110 Nausea: Secondary | ICD-10-CM

## 2016-04-20 DIAGNOSIS — Z9181 History of falling: Secondary | ICD-10-CM

## 2016-04-20 DIAGNOSIS — Z8673 Personal history of transient ischemic attack (TIA), and cerebral infarction without residual deficits: Secondary | ICD-10-CM | POA: Diagnosis not present

## 2016-04-20 DIAGNOSIS — K219 Gastro-esophageal reflux disease without esophagitis: Secondary | ICD-10-CM | POA: Diagnosis not present

## 2016-04-20 DIAGNOSIS — E785 Hyperlipidemia, unspecified: Secondary | ICD-10-CM | POA: Diagnosis not present

## 2016-04-20 DIAGNOSIS — Z1239 Encounter for other screening for malignant neoplasm of breast: Secondary | ICD-10-CM

## 2016-04-20 DIAGNOSIS — G8929 Other chronic pain: Secondary | ICD-10-CM

## 2016-04-20 DIAGNOSIS — Z01419 Encounter for gynecological examination (general) (routine) without abnormal findings: Secondary | ICD-10-CM | POA: Insufficient documentation

## 2016-04-20 DIAGNOSIS — Z86018 Personal history of other benign neoplasm: Secondary | ICD-10-CM

## 2016-04-20 DIAGNOSIS — E2839 Other primary ovarian failure: Secondary | ICD-10-CM

## 2016-04-20 DIAGNOSIS — J320 Chronic maxillary sinusitis: Secondary | ICD-10-CM

## 2016-04-20 DIAGNOSIS — M5416 Radiculopathy, lumbar region: Secondary | ICD-10-CM

## 2016-04-20 DIAGNOSIS — I10 Essential (primary) hypertension: Secondary | ICD-10-CM

## 2016-04-20 DIAGNOSIS — R4189 Other symptoms and signs involving cognitive functions and awareness: Secondary | ICD-10-CM

## 2016-04-20 DIAGNOSIS — Z124 Encounter for screening for malignant neoplasm of cervix: Secondary | ICD-10-CM

## 2016-04-20 LAB — CBC WITH DIFFERENTIAL/PLATELET
Basophils Absolute: 65 cells/uL (ref 0–200)
Basophils Relative: 1 %
Eosinophils Absolute: 260 cells/uL (ref 15–500)
Eosinophils Relative: 4 %
HCT: 39.9 % (ref 35.0–45.0)
Hemoglobin: 12.5 g/dL (ref 11.7–15.5)
Lymphocytes Relative: 44 %
Lymphs Abs: 2860 cells/uL (ref 850–3900)
MCH: 27.1 pg (ref 27.0–33.0)
MCHC: 31.3 g/dL — ABNORMAL LOW (ref 32.0–36.0)
MCV: 86.4 fL (ref 80.0–100.0)
MPV: 10 fL (ref 7.5–12.5)
Monocytes Absolute: 455 cells/uL (ref 200–950)
Monocytes Relative: 7 %
Neutro Abs: 2860 cells/uL (ref 1500–7800)
Neutrophils Relative %: 44 %
Platelets: 360 10*3/uL (ref 140–400)
RBC: 4.62 MIL/uL (ref 3.80–5.10)
RDW: 13.6 % (ref 11.0–15.0)
WBC: 6.5 10*3/uL (ref 4.0–10.5)

## 2016-04-20 LAB — POCT URINALYSIS DIPSTICK
Bilirubin, UA: NEGATIVE
Blood, UA: NEGATIVE
Glucose, UA: NEGATIVE
Ketones, UA: NEGATIVE
Leukocytes, UA: NEGATIVE
Nitrite, UA: NEGATIVE
Protein, UA: NEGATIVE
Spec Grav, UA: 1.02
Urobilinogen, UA: NEGATIVE
pH, UA: 7

## 2016-04-20 LAB — LIPID PANEL
Cholesterol: 157 mg/dL (ref <200)
HDL: 74 mg/dL (ref >50)
LDL Cholesterol: 74 mg/dL (ref <100)
Total CHOL/HDL Ratio: 2.1 Ratio (ref <5.0)
Triglycerides: 47 mg/dL (ref <150)
VLDL: 9 mg/dL (ref <30)

## 2016-04-20 LAB — COMPREHENSIVE METABOLIC PANEL
ALT: 13 U/L (ref 6–29)
AST: 15 U/L (ref 10–35)
Albumin: 3.7 g/dL (ref 3.6–5.1)
Alkaline Phosphatase: 96 U/L (ref 33–130)
BUN: 16 mg/dL (ref 7–25)
CO2: 30 mmol/L (ref 20–31)
Calcium: 9.2 mg/dL (ref 8.6–10.4)
Chloride: 106 mmol/L (ref 98–110)
Creat: 0.63 mg/dL (ref 0.50–1.05)
Glucose, Bld: 86 mg/dL (ref 65–99)
Potassium: 4.4 mmol/L (ref 3.5–5.3)
Sodium: 141 mmol/L (ref 135–146)
Total Bilirubin: 0.5 mg/dL (ref 0.2–1.2)
Total Protein: 6.9 g/dL (ref 6.1–8.1)

## 2016-04-20 LAB — IRON: Iron: 81 ug/dL (ref 45–160)

## 2016-04-20 MED ORDER — CEFUROXIME AXETIL 500 MG PO TABS: 500.0000 mg | ORAL_TABLET | Freq: Two times a day (BID) | ORAL | 0 refills | Status: DC

## 2016-04-20 NOTE — Progress Notes (Signed)
Subjective:   HPI  Lisa Briggs is a 56 y.o. female who presents for a complete physical.  Medical care team includes:  Dr. [name], [facility] Surgery  Dr. Owens Loffler, GI, prior with Dr. Benson Norway, dismissed from their practice  Has seen gynecology in the last few years at my request given chronic pelvic pain, prior requests for [facility] for vague complaints  Podiatry.  Lisa Oxford, PA-C here for primary care Dr. Einar Gip, eye doctor Sees dentist Neurology, Dr. Narda Amber  Concerns: Still having sinus problems.  Saw Urgent care recently, completed antibiotic but still having sinus pressure, colored and blood tinged mucous, congestion.    Wants note for work.  Sometimes when she has to use bathroom bad with BMs, needs to go right then.   The closest bathroom to her has been designated for a different dept, but she wants note to be able to use "closest" bathroom so she doesn't have an accident.    She had flu shot on the job recently.  Reviewed their medical, surgical, family, social, medication, and allergy history and updated chart as appropriate.  Past Medical History:  Diagnosis Date  . Allergy   . Anemia    iron therapy for years as of 10/12; normal Hgb 08/2013  . Arthritis   . Chest pain 04/05/2011   cardiac eval, normal treadmill stress test, Dr. Tollie Eth  . Chronic back pain   . Constipation   . Farsightedness    wears glasses, Eye care center  . 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 [name], [facility]  . GERD (gastroesophageal reflux disease)   . History of uterine fibroid   . Hyperlipidemia   . Hypertension   . Paresthesia 09/2014   initially thought to be TIA, neurology consult in 12/2014 with other non TIA considerations.    . Polyarthralgia    normal rheumatoid screen 01/2012    Past Surgical History:  Procedure Laterality Date  . COLONOSCOPY  01/2014   diverticulosis, othwerise normal  - Dr. Owens Loffler  . ESOPHAGOGASTRODUODENOSCOPY  2013   Dr. Benson Norway, gastritis  . ESOPHAGOGASTRODUODENOSCOPY  [id number]  . EUS N/A 04/16/2014   Procedure: UPPER ENDOSCOPIC ULTRASOUND (EUS) LINEAR;  Surgeon: Milus Banister, MD;  Location: [facility] ENDOSCOPY;  Service: Endoscopy;  Laterality: N/A;  . gall stone surgery    . KNEE ARTHROSCOPY Left   . LAPAROSCOPIC GASTRIC RESECTION N/A 06/09/2014   Procedure: LAPAROSCOPIC GASTRIC MASS RESECTION;  Surgeon: [name], MD;  Location: [facility] ORS;  Service: General;  Laterality: N/A;  . LIPOMA EXCISION     forehead  . UTERINE FIBROID SURGERY      Social History   Social History  . Marital status: Married    Spouse name: N/A  . Number of children: 1  . Years of education: N/A   Occupational History  . Machine operator [employer]   Social History Main Topics  . Smoking status: Never Smoker  . Smokeless tobacco: Never Used  . Alcohol use No  . Drug use: No  . Sexual activity: Not on file   Other Topics Concern  . Not on file   Social History Narrative   Married, has 1 son in [place] and some grandchildren.  Glass blower/designer.  Active on job.  Does stretching and exercises daily as per physical therapy.  Works 12 hours daily.      Family History  Problem Relation Age of Onset  . Hypertension Mother   .  Arthritis Mother   . GER disease Mother   . Glaucoma Mother   . Stroke Father   . Alcohol abuse Father   . Breast cancer Sister     breast cancer dx late 63s; another sister with leukemia  . Hypertension Sister   . Heart disease Sister     heart murmur; other sisters with heart problems  . Diabetes Brother   . Colon cancer Brother 22  . Diabetes Maternal Aunt   . Heart disease Maternal Aunt   . Heart disease Maternal Grandmother   . Heart disease Maternal Grandfather   . Heart disease Maternal Uncle   . Stroke Maternal Uncle   . Colon polyps Neg Hx      Current Outpatient Prescriptions:  .  aspirin EC 81 MG tablet, Take 1 tablet  (81 mg total) by mouth daily., Disp: 90 tablet, Rfl: 3 .  DEXILANT 60 MG capsule, TAKE ONE CAPSULE BY MOUTH ONCE DAILY, Disp: 30 capsule, Rfl: 2 .  EQ LORATADINE 10 MG tablet, TAKE ONE TABLET BY MOUTH ONCE DAILY, Disp: 30 tablet, Rfl: 1 .  famotidine (PEPCID) 20 MG tablet, Take 1 tablet (20 mg total) by mouth at bedtime., Disp: 90 tablet, Rfl: 3 .  FERROCITE 324 MG TABS tablet, TAKE ONE TABLET BY MOUTH TWICE DAILY, Disp: 60 tablet, Rfl: 0 .  fluticasone (FLONASE) 50 MCG/ACT nasal spray, USE ONE SPRAY(S) IN EACH NOSTRIL DAILY AS NEEDED FOR ALLERGIES OR RHINITIS., Disp: 16 g, Rfl: 11 .  gabapentin (NEURONTIN) 300 MG capsule, Take 1 capsule (300 mg total) by mouth at bedtime., Disp: 90 capsule, Rfl: 3 .  KLOR-CON M10 10 MEQ tablet, TAKE ONE TABLET BY MOUTH DAILY, Disp: 90 tablet, Rfl: 3 .  losartan-hydrochlorothiazide (HYZAAR) 50-12.5 MG tablet, Take 1 tablet by mouth daily., Disp: 90 tablet, Rfl: 3 .  meloxicam (MOBIC) 15 MG tablet, TAKE ONE TABLET BY MOUTH ONCE DAILY, Disp: 90 tablet, Rfl: 0 .  ondansetron (ZOFRAN) 4 MG tablet, TAKE ONE TABLET BY MOUTH EVERY 8 HOURS AS NEEDED FOR NAUSEA OR VOMITING, Disp: 90 tablet, Rfl: 0 .  polyethylene glycol powder (GLYCOLAX/MIRALAX) powder, TAKE ONE CAPFUL (17 GRAMS) BY MOUTH DAILY, Disp: 527 g, Rfl: 3 .  simvastatin (ZOCOR) 20 MG tablet, Take 1 tablet (20 mg total) by mouth daily., Disp: 90 tablet, Rfl: 3 .  Vitamin D, Ergocalciferol, (DRISDOL) 50000 units CAPS capsule, TAKE 1 CAPSULE BY MOUTH EVERY 7 DAYS, Disp: 4 capsule, Rfl: 5 .  cyclobenzaprine (FLEXERIL) 10 MG tablet, TAKE ONE TABLET BY MOUTH TWICE DAILY AS NEEDED FOR  MUSCLE  SPASM (Patient not taking: Reported on 04/20/2016), Disp: 30 tablet, Rfl: 0 .  dicyclomine (BENTYL) 20 MG tablet, Take 1 tablet (20 mg total) by mouth 2 (two) times daily. (Patient not taking: Reported on 04/20/2016), Disp: 20 tablet, Rfl: 0  Allergies  Allergen Reactions  . Kiwi Extract Hives and Itching  . Mucinex Dm  [Dm-Guaifenesin Er] Nausea Only    Messes up her stomach. Pt report on 01/21/15  . Peach [Prunus Persica] Itching    Peach peeling makes pt itch    Review of Systems Constitutional: -fever, -chills, -sweats, -unexpected weight change, -decreased appetite, -fatigue Allergy: -sneezing, -itching, -congestion Dermatology: -changing moles, --rash, -lumps ENT: -runny nose, -ear pain, -sore throat, -hoarseness, -sinus pain, -teeth pain, - ringing in ears, -hearing loss, -nosebleeds Cardiology: -chest pain, -palpitations, -swelling, -difficulty breathing when lying flat, -waking up short of breath Respiratory: -cough, -shortness of breath, -difficulty breathing with exercise or  exertion, -wheezing, -coughing up blood Gastroenterology: -abdominal pain, -nausea, -vomiting, -diarrhea, -constipation, -blood in stool, -changes in bowel movement, -difficulty swallowing or eating Hematology: -bleeding, -bruising  Musculoskeletal: -joint aches, -muscle aches, -joint swelling, -back pain, -neck pain, -cramping, -changes in gait Ophthalmology: denies vision changes, eye redness, itching, discharge Urology: -burning with urination, -difficulty urinating, -blood in urine, -urinary frequency, -urgency, -incontinence Neurology: -headache, -weakness, -tingling, +numbness, -memory loss, -falls, -dizziness Psychology: -depressed mood, -agitation, -sleep problems     Objective:   Physical Exam  BP 130/80 (BP Location: Left Arm, Patient Position: Sitting, Cuff Size: Normal)   Pulse 76   Temp 97.8 F (36.6 C) (Oral)   Resp 18   Ht 5\' 9"  (1.753 m)   Wt 228 lb 6.4 oz (103.6 kg)   LMP 02/23/2015   SpO2 98%   BMI 33.73 kg/m   General appearance: alert, no distress, WD/WN, AA female Skin: right lower abdomen, inferior to umbilicus with 47mm x 37mm brown flat macules, somewhat triangular shaped, but well defined, uniform color. Few other scattered benign appearing macules. No other worrisome lesions HEENT:  normocephalic, conjunctiva/corneas normal, sclerae anicteric, PERRLA, EOMi, nares patent, no discharge or erythema, pharynx normal Oral cavity: MMM, tongue normal, teeth in good repair Neck: supple, no lymphadenopathy, no thyromegaly, no masses, normal ROM, no bruits Chest: non tender, normal shape and expansion Heart: RRR, normal S1, S2, no murmurs Lungs: CTA bilaterally, no wheezes, rhonchi, or rales Abdomen: +bs, soft, surgical port scars of right upper and right mid abdomen, umbilicus, mild lower abdominal tenderness, otherwise non tender, non distended, no masses, no hepatomegaly, no splenomegaly, no bruits Back: back non tender, normal ROM, no scoliosis Musculoskeletal: extremities  nontender, normal ROM,no edema, no cyanosis, no clubbing Pulses: 2+ symmetric, upper and lower extremities, normal cap refill Neurological: alert, oriented x 3, CN2-12 intact, strength normal upper extremities and lower extremities, sensation normal throughout, DTRs 2+ throughout, no cerebellar signs, gait normal Psychiatric: normal affect, behavior normal, pleasant  Breast: nontender, no masses or lumps, no skin changes, no nipple discharge or inversion, no axillary lymphadenopathy Gyn: Normal external genitalia without lesions, vagina with normal mucosa, cervix without lesions, no cervical motion tenderness, no abnormal vaginal discharge. Uterus with tenderness and seems enlarged, otherwise adnexa not enlarged, no masses.  chaperoned by nurse. Rectal: deferred   Assessment and Plan :    Encounter Diagnoses  Name Primary?  . Encounter for health maintenance examination in adult Yes  . Essential hypertension   . Allergic rhinitis, unspecified chronicity, unspecified seasonality, unspecified trigger   . Gastroesophageal reflux disease without esophagitis   . Chronic nausea   . Chronic abdominal pain   . History of gastrointestinal stromal tumor (GIST)   . History of TIA (transient ischemic attack)   .  Vitamin D deficiency   . Screening for cervical cancer   . Hyperlipidemia, unspecified hyperlipidemia type   . History of uterine leiomyoma   . Facial paresthesia   . Cognitive decline   . Chronic radicular lumbar pain   . Screening for breast cancer   . Estrogen deficiency   . Chronic maxillary sinusitis   . Impaired fasting blood sugar   . Constipation, unspecified constipation type   . History of fall    Physical exam - discussed healthy lifestyle, diet, exercise, preventative care, vaccinations, and addressed their concerns.  See your eye doctor yearly for routine vision care. See your dentist yearly for routine dental care including hygiene visits twice yearly. Routine labs today F/u with  neurology as planned for paresthesias of feet and hx/o TIA, f/u with GI as planned She has endometrial biopsy soon scheduled with gynecology, however she wants to still get pap smear here, thus, pap done today advised health diet, regular exercise GERD - c/t same medication, discussed long term risks of PPIs, seems to be fairly well controlled at current HTN - controlled on current medication Hx/o hyperlipidemia - f/u pending labs. Allergic rhinitis - c/t current medication Completed work note for bathroom use Get mammogram yearly Begin round of Ceftin for sinusitis Hx/o anemia, hans 't been using iron since last visit as we discussed.' Will set up for baseline bone density scan.  Her risks include hx/o falls, estrogen deficiency, vit d deficiency, high risk medications for gerd Follow-up pending labs   Staley was seen today for annual exam.  Diagnoses and all orders for this visit:  Encounter for health maintenance examination in adult -     Visual acuity screening -     POCT Urinalysis Dipstick -     Iron -     Comprehensive metabolic panel -     CBC with Differential/Platelet -     Lipid panel -     Vitamin D, 25-hydroxy -     Cytology - PAP -     Hemoglobin A1c  Essential  hypertension -     Lipid panel  Allergic rhinitis, unspecified chronicity, unspecified seasonality, unspecified trigger  Gastroesophageal reflux disease without esophagitis -     DG Bone Density; Future  Chronic nausea  Chronic abdominal pain  History of gastrointestinal stromal tumor (GIST)  History of TIA (transient ischemic attack)  Vitamin D deficiency -     Vitamin D, 25-hydroxy -     DG Bone Density; Future  Screening for cervical cancer -     Cytology - PAP  Hyperlipidemia, unspecified hyperlipidemia type  History of uterine leiomyoma  Facial paresthesia  Cognitive decline  Chronic radicular lumbar pain  Screening for breast cancer -     MM DIGITAL SCREENING BILATERAL; Future  Estrogen deficiency -     DG Bone Density; Future  Chronic maxillary sinusitis  Impaired fasting blood sugar -     Hemoglobin A1c  Constipation, unspecified constipation type  History of fall -     DG Bone Density; Future

## 2016-04-21 ENCOUNTER — Other Ambulatory Visit: Payer: Self-pay | Admitting: Medical

## 2016-04-21 DIAGNOSIS — K2950 Unspecified chronic gastritis without bleeding: Secondary | ICD-10-CM

## 2016-04-21 DIAGNOSIS — R110 Nausea: Secondary | ICD-10-CM

## 2016-04-21 LAB — HEMOGLOBIN A1C
Hgb A1c MFr Bld: 5.8 % — ABNORMAL HIGH (ref <5.7)
Mean Plasma Glucose: 120 mg/dL

## 2016-04-21 LAB — VITAMIN D 25 HYDROXY (VIT D DEFICIENCY, FRACTURES): Vit D, 25-Hydroxy: 46 ng/mL (ref 30–100)

## 2016-04-21 MED ORDER — LOSARTAN POTASSIUM-HCTZ 50-12.5 MG PO TABS
1.0000 | ORAL_TABLET | Freq: Every day | ORAL | 3 refills | Status: DC
Start: 2016-04-21 — End: 2017-05-07

## 2016-04-21 MED ORDER — POTASSIUM CHLORIDE CRYS ER 10 MEQ PO TBCR: 10.0000 meq | EXTENDED_RELEASE_TABLET | Freq: Every day | ORAL | 3 refills | Status: DC

## 2016-04-21 MED ORDER — FAMOTIDINE 20 MG PO TABS: 20.0000 mg | ORAL_TABLET | Freq: Every day | ORAL | 3 refills | Status: DC

## 2016-04-21 MED ORDER — VITAMIN D (ERGOCALCIFEROL) 1.25 MG (50000 UNIT) PO CAPS
ORAL_CAPSULE | ORAL | 3 refills | Status: DC
Start: 2016-04-21 — End: 2016-09-08

## 2016-04-21 MED ORDER — SIMVASTATIN 20 MG PO TABS: 20.0000 mg | ORAL_TABLET | Freq: Every day | ORAL | 3 refills | Status: DC

## 2016-04-21 MED ORDER — DEXLANSOPRAZOLE 60 MG PO CPDR: 1.0000 | DELAYED_RELEASE_CAPSULE | Freq: Every day | ORAL | 1 refills | Status: DC

## 2016-04-21 MED ORDER — ASPIRIN EC 81 MG PO TBEC
81.0000 mg | DELAYED_RELEASE_TABLET | Freq: Every day | ORAL | 3 refills | Status: DC
Start: 2016-04-21 — End: 2017-08-19

## 2016-04-21 MED ORDER — LORATADINE 10 MG PO TABS: 10.0000 mg | ORAL_TABLET | Freq: Every day | ORAL | 3 refills | Status: DC

## 2016-04-21 MED ORDER — POLYETHYLENE GLYCOL 3350 17 GM/SCOOP PO POWD: ORAL | 3 refills | Status: DC

## 2016-04-24 ENCOUNTER — Other Ambulatory Visit: Payer: BLUE CROSS/BLUE SHIELD

## 2016-04-24 ENCOUNTER — Other Ambulatory Visit: Payer: Self-pay | Admitting: Medical

## 2016-04-24 DIAGNOSIS — R928 Other abnormal and inconclusive findings on diagnostic imaging of breast: Secondary | ICD-10-CM

## 2016-04-24 LAB — CYTOLOGY - PAP: Diagnosis: NEGATIVE

## 2016-04-26 DIAGNOSIS — N950 Postmenopausal bleeding: Secondary | ICD-10-CM | POA: Diagnosis not present

## 2016-04-26 DIAGNOSIS — N920 Excessive and frequent menstruation with regular cycle: Secondary | ICD-10-CM | POA: Diagnosis not present

## 2016-04-28 ENCOUNTER — Encounter: Payer: Self-pay | Admitting: Neurology

## 2016-04-28 ENCOUNTER — Ambulatory Visit (INDEPENDENT_AMBULATORY_CARE_PROVIDER_SITE_OTHER): Payer: BLUE CROSS/BLUE SHIELD | Admitting: Neurology

## 2016-04-28 VITALS — BP 110/80 | HR 100 | Ht 69.0 in | Wt 232.4 lb

## 2016-04-28 DIAGNOSIS — M5416 Radiculopathy, lumbar region: Secondary | ICD-10-CM | POA: Diagnosis not present

## 2016-04-28 MED ORDER — GABAPENTIN 300 MG PO CAPS: ORAL_CAPSULE | ORAL | 5 refills | Status: DC

## 2016-04-28 NOTE — Patient Instructions (Addendum)
1.  Start physical therapy for right leg pain  2.  Increase gabapentin to 1 tablet in the afternoon and 2 tablets at bedtime  Return to clinic in 5 months

## 2016-04-28 NOTE — Progress Notes (Signed)
Follow-up Visit   Date: 04/28/16    Lisa Briggs MRN: [id number] DOB: 1960-12-28   Interim History: Lisa Briggs is a 56 y.o.  right-handed African American female with hypertension, GERD, polyarthralgia, hyperlipidemia returning to the clinic for follow-up of right leg pain.  The patient was accompanied to the clinic by self.  History of present illness: Patient reports having a constellation of neurological symptoms which first began in January 2016. She experienced tingling of the face and mouth with right leg heaviness. She went to the emergency room and was found to have low potassium. Symptoms imprved spontaneously. Again, several months later, she developed the same symptoms but this time with bilateral hand paresthesias. Potassium was low again. Most recently, in June she had another spell of facial paresthesias, bilateral leg pain, but this time it was associated with left arm weakness and word-finding difficulty. She was admitted overnight for stroke work-up which returned negative. MRI/A brain, US carotids, and echo was normal.  She also complains of bilateral shooting leg pain, worse on the right that travels down her lateral thigh and lower leg. MRI lumbar spine from 2014 shows multilevel disc disease, as well as mild foraminal encroachment bilaterally at L2-3 and L3-4 as well as broad based right foraminal disc protrusion at L5-S1.  UPDATE 04/29/2015:  She has noticed improvement of her leg pain with gabapentin 300mg  at bedtime, her pain only occurs every few weeks now.  She has not had any further spells of facial paresthesias.  No new headaches.  She is being evaluated for new onset vaginal bleeding by her OBGYN and is tearful about this because of concern of uterine cancer.    UPDATE 04/28/2016:   She is here with worsening right leg radiculopathy.  She has shooting pain that continues to start in the low back and radiates down her thigh and into the sole of the foot.   She was doing better when she was able to do stretching exercises.  She has been taking gabapentin 300mg  at bedtime and takes an extra 1-2 tablets during the day as needed, which makes her a little sleepy.  She denies any weakness or falls.     Medications:  Current Outpatient Prescriptions on File Prior to Visit  Medication Sig Dispense Refill  . aspirin EC 81 MG tablet Take 1 tablet (81 mg total) by mouth daily. 90 tablet 3  . cyclobenzaprine (FLEXERIL) 10 MG tablet TAKE ONE TABLET BY MOUTH TWICE DAILY AS NEEDED FOR  MUSCLE  SPASM 30 tablet 0  . dexlansoprazole (DEXILANT) 60 MG capsule Take 1 capsule (60 mg total) by mouth daily. 90 capsule 1  . famotidine (PEPCID) 20 MG tablet Take 1 tablet (20 mg total) by mouth at bedtime. 90 tablet 3  . fluticasone (FLONASE) 50 MCG/ACT nasal spray USE ONE SPRAY(S) IN EACH NOSTRIL DAILY AS NEEDED FOR ALLERGIES OR RHINITIS. 16 g 11  . loratadine (EQ LORATADINE) 10 MG tablet Take 1 tablet (10 mg total) by mouth daily. 90 tablet 3  . losartan-hydrochlorothiazide (HYZAAR) 50-12.5 MG tablet Take 1 tablet by mouth daily. 90 tablet 3  . meloxicam (MOBIC) 15 MG tablet TAKE ONE TABLET BY MOUTH ONCE DAILY 90 tablet 0  . ondansetron (ZOFRAN) 4 MG tablet TAKE ONE TABLET BY MOUTH EVERY 8 HOURS AS NEEDED FOR NAUSEA OR VOMITING 90 tablet 0  . potassium chloride (KLOR-CON M10) 10 MEQ tablet Take 1 tablet (10 mEq total) by mouth daily. 90 tablet 3  . simvastatin (ZOCOR)  20 MG tablet Take 1 tablet (20 mg total) by mouth daily. 90 tablet 3  . Vitamin D, Ergocalciferol, (DRISDOL) 50000 units CAPS capsule TAKE 1 CAPSULE BY MOUTH EVERY 7 DAYS 12 capsule 3   No current facility-administered medications on file prior to visit.     Allergies:  Allergies  Allergen Reactions  . Kiwi Extract Hives and Itching  . Mucinex Dm [Dm-Guaifenesin Er] Nausea Only    Messes up her stomach. Pt report on 01/21/15  . Peach [Prunus Persica] Itching    Peach peeling makes pt itch     Review of Systems:  CONSTITUTIONAL: No fevers, chills, night sweats, or weight loss.  EYES: No visual changes or eye pain ENT: No hearing changes.  No history of nose bleeds.   RESPIRATORY: No cough, wheezing and shortness of breath.   CARDIOVASCULAR: Negative for chest pain, and palpitations.   GI: Negative for abdominal discomfort, blood in stools or black stools.  No recent change in bowel habits.   GU:  No history of incontinence.   MUSCLOSKELETAL: No history of joint pain or swelling.  No myalgias.   SKIN: Negative for lesions, rash, and itching.   ENDOCRINE: Negative for cold or heat intolerance, polydipsia or goiter.   PSYCH:  + depression or anxiety symptoms.   NEURO: As Above.   Vital Signs:  BP 110/80   Pulse 100   Ht 5\' 9"  (1.753 m)   Wt 232 lb 7 oz (105.4 kg)   LMP 02/23/2015   SpO2 99%   BMI 34.33 kg/m   Neurological Exam: MENTAL STATUS including orientation to time, place, person, recent and remote memory, attention span and concentration, language, and fund of knowledge is normal.  Speech is not dysarthric.  CRANIAL NERVES:  Pupils equal round and reactive to light.  Normal conjugate, extra-ocular eye movements in all directions of gaze.  No ptosis.Face is symmetric.   MOTOR:  Motor strength is 5/5 in all extremities.  No pronator drift.  Tone is normal.    MSRs:  Reflexes are 2+/4 throughout.  SENSORY:  Intact to vibration.  COORDINATION/GAIT:   Gait mildly wide based due to body habitus.    Data: EEG 12/21/2014:  Normal 30-day cardiac monitor:  Normal  MRI/A brain 09/29/2014: 1. Scattered periventricular and subcortical T2 hyperintensities are slightly greater than expected for age. The finding is nonspecific but can be seen in the setting of chronic microvascular ischemia, a demyelinating process such as multiple sclerosis, vasculitis, complicated migraine headaches, or as the sequelae of a prior infectious or inflammatory process. 2. No acute  abnormality. No evidence for acute or subacute infarct. 3. Normal variant MRA circle of Willis without evidence for significant proximal stenosis, aneurysm, or branch vessel occlusion.  Echo 09/30/2014: - Normal EF, Moderate TR, mild MR, otherwise normal study.  US carotids 09/29/2014: 1-39% bilateral ICA stenosis.   MRI lumbar spine wo contrast 05/07/2014:  1. Multilevel disc disease and facet disease. 2. Multilevel lateral recess encroachment. 3. Mild bilateral foraminal encroachment at L2-3 and L3-4. 4. Mild left foraminal stenosis at L4-5. 5. Shallow broad-based right foraminal disc protrusion at L5-S1.  Marland Kitchen IMPRESSION/PLAN: Right leg radiculopathy, most suggestive of S1 radiculopathy vs sciatic neuropathy - worsening  - MRI lumbar spine from 2014 showed degenerative changes, multilevel foraminal stenosis, and disc protrusion at L5-S1 on the right which could potentially be getting worse.  - Start physical therapy for low back strengthening  - Increase gabapentin to 300mg  in afternoon and 600mg  at  bedtime  - If her symptoms do not improve with conservative therapies, repeat imaging of the lumbar spine will be the next step  Return to clinic in 4 months  The duration of this appointment visit was 25 minutes of face-to-face time with the patient.  Greater than 50% of this time was spent in counseling, explanation of diagnosis, planning of further management, and coordination of care.   Thank you for allowing me to participate in patient's care.  If I can answer any additional questions, I would be pleased to do so.    Sincerely,    Donika K. Posey Pronto, DO

## 2016-05-04 ENCOUNTER — Ambulatory Visit
Admission: RE | Admit: 2016-05-04 | Discharge: 2016-05-04 | Disposition: A | Discharge status: Home / Self Care | Payer: BLUE CROSS/BLUE SHIELD | Source: Ambulatory Visit | Attending: Medical | Admitting: Medical

## 2016-05-04 DIAGNOSIS — R928 Other abnormal and inconclusive findings on diagnostic imaging of breast: Secondary | ICD-10-CM | POA: Diagnosis not present

## 2016-05-08 ENCOUNTER — Telehealth: Payer: Self-pay

## 2016-05-10 DIAGNOSIS — M545 Low back pain: Secondary | ICD-10-CM | POA: Diagnosis not present

## 2016-05-15 NOTE — Telephone Encounter (Signed)
error 

## 2016-05-23 DIAGNOSIS — M545 Low back pain: Secondary | ICD-10-CM | POA: Diagnosis not present

## 2016-05-29 ENCOUNTER — Ambulatory Visit: Payer: BLUE CROSS/BLUE SHIELD | Admitting: Neurology

## 2016-06-05 ENCOUNTER — Other Ambulatory Visit: Payer: Self-pay | Admitting: Medical

## 2016-06-06 NOTE — Telephone Encounter (Signed)
Is this okay to refill? 

## 2016-07-24 DIAGNOSIS — N951 Menopausal and female climacteric states: Secondary | ICD-10-CM | POA: Diagnosis not present

## 2016-09-03 ENCOUNTER — Other Ambulatory Visit: Payer: Self-pay | Admitting: Medical

## 2016-09-08 ENCOUNTER — Encounter: Payer: Self-pay | Admitting: Family Medicine

## 2016-09-08 ENCOUNTER — Ambulatory Visit (INDEPENDENT_AMBULATORY_CARE_PROVIDER_SITE_OTHER): Payer: BLUE CROSS/BLUE SHIELD | Admitting: Family Medicine

## 2016-09-08 VITALS — BP 140/90 | HR 85 | Temp 98.2°F | Resp 16

## 2016-09-08 DIAGNOSIS — R51 Headache: Secondary | ICD-10-CM

## 2016-09-08 DIAGNOSIS — R201 Hypoesthesia of skin: Secondary | ICD-10-CM

## 2016-09-08 DIAGNOSIS — R202 Paresthesia of skin: Secondary | ICD-10-CM

## 2016-09-08 DIAGNOSIS — R519 Headache, unspecified: Secondary | ICD-10-CM

## 2016-09-08 DIAGNOSIS — R200 Anesthesia of skin: Secondary | ICD-10-CM | POA: Diagnosis not present

## 2016-09-08 NOTE — Progress Notes (Signed)
Subjective:    Patient ID: Lisa Briggs, female    DOB: [birth year], 56 y.o.   MRN: [id number]  HPI Chief Complaint  Patient presents with  . not feeling right    not feeling right, numbness and tingling in face and mouth, eye pain, weakness. been going on all week   She is here as a walk in to our office today with complaints of left sided frontal headache, tingling on both sides of her face mainly around her mouth, paresthesias to bilateral hands and a sharp shooting pain down the front of her right upper leg since 3 am this morning. States she was at work when this started. States she is having difficulty with her balance. Feels weak "all over" and this has been ongoing for the past week, not getting worse.  States she is having difficulty speaking. Is not having slurred speech. States she has had ongoing stuttering issue for the past couple of years.  Reports having similar symptoms on multiple occasions and has seen Dr. Posey Pronto, neurologist, for this in the past. She has had a negative workup for CVA in the past.   States she drove herself here today.  Denies fever, chills, URI symptoms, rash, chest pain, palpitations, shortness of breath, abdominal pain, back pain, N/V/D, urinary symptoms, LE edema.  Denies unilateral or focal weakness.   Denies feeling confused. Nomemory issues.   Past Medical History:  Diagnosis Date  . Allergy   . Anemia    iron therapy for years as of 10/12; normal Hgb 08/2013  . Arthritis   . Chest pain 04/05/2011   cardiac eval, normal treadmill stress test, Dr. Tollie Eth  . Chronic back pain   . Constipation   . Farsightedness    wears glasses, [facility] care center  . 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 [name], [facility]  . GERD (gastroesophageal reflux disease)   . History of uterine fibroid   . Hyperlipidemia   . Hypertension   . Paresthesia 09/2014   initially thought to be TIA, neurology consult in 12/2014  with other non TIA considerations.    . Polyarthralgia    normal rheumatoid screen 01/2012   Past Surgical History:  Procedure Laterality Date  . COLONOSCOPY  01/2014   diverticulosis, othwerise normal - Dr. Owens Loffler  . ESOPHAGOGASTRODUODENOSCOPY  2013   Dr. Benson Norway, gastritis  . ESOPHAGOGASTRODUODENOSCOPY  [id number]  . EUS N/A 04/16/2014   Procedure: UPPER ENDOSCOPIC ULTRASOUND (EUS) LINEAR;  Surgeon: Milus Banister, MD;  Location: [facility];  Service: Endoscopy;  Laterality: N/A;  . gall stone surgery    . KNEE ARTHROSCOPY Left   . LAPAROSCOPIC GASTRIC RESECTION N/A 06/09/2014   Procedure: LAPAROSCOPIC GASTRIC MASS RESECTION;  Surgeon: [name], MD;  Location: [facility] ORS;  Service: General;  Laterality: N/A;  . LIPOMA EXCISION     forehead  . UTERINE FIBROID SURGERY      Reviewed allergies, medications, past medical, surgical,  and social history.   Review of Systems Pertinent positives and negatives in the history of present illness.     Objective:   Physical Exam  Constitutional: She is oriented to person, place, and time. She appears well-developed and well-nourished. She does not have a sickly appearance. No distress.  HENT:  Right Ear: Hearing, tympanic membrane and ear canal normal.  Left Ear: Hearing, tympanic membrane and ear canal normal.  Nose: Nose normal. Right sinus exhibits no maxillary sinus tenderness and no frontal  sinus tenderness. Left sinus exhibits no maxillary sinus tenderness and no frontal sinus tenderness.  Mouth/Throat: Uvula is midline, oropharynx is clear and moist and mucous membranes are normal.  Eyes: Conjunctivae, EOM and lids are normal. Pupils are equal, round, and reactive to light.  Neck: Normal range of motion. Neck supple. No JVD present. Carotid bruit is not present.  Cardiovascular: Normal rate, regular rhythm and normal pulses.  Exam reveals no gallop and no friction rub.   No murmur heard. Pulmonary/Chest: Effort normal and  breath sounds normal.  Musculoskeletal: Normal range of motion.  Lymphadenopathy:    She has no cervical adenopathy.       Right: No supraclavicular adenopathy present.       Left: No supraclavicular adenopathy present.  Neurological: She is alert and oriented to person, place, and time. She has normal strength and normal reflexes. She displays no tremor. No cranial nerve deficit or sensory deficit. She displays a negative Romberg sign. Coordination and gait normal. GCS eye subscore is 4. GCS verbal subscore is 5. GCS motor subscore is 6.  No facial asymmetry or pronator drift, normal finger to nose, heel to shin. Able to ambulate without difficulty.   Skin: Skin is warm and dry. No rash noted. No pallor.  Psychiatric: She has a normal mood and affect. Her speech is normal. Judgment and thought content normal. Cognition and memory are normal.  No dysarthria    BP 140/90   Pulse 85   Temp 98.2 F (36.8 C) (Oral)   Resp 16   LMP 02/23/2015   SpO2 99%       Assessment & Plan:  Frontal headache  Tingling of face  Numbness and tingling in both hands  Discussed that her neurological exam is unremarkable. No obvious focal weakness. Reassured her that she does not appear to be having an acute neurological event. Reviewed patient's previous medical history and results. Discussed patient's symptoms and exam findings with Dr. Posey Pronto via telephone and Dr. Posey Pronto states that findings are consistent with previous visits in the past. Dr. Posey Pronto recommends that the patient be discharged home, treat her headache and if she gets worse to go to the ED for further evaluation. She does have an appointment scheduled with Dr. Posey Pronto next month. Patient verbalized understanding of the plan of care. Work note given per patient request.

## 2016-09-10 ENCOUNTER — Other Ambulatory Visit: Payer: Self-pay | Admitting: Medical

## 2016-09-11 NOTE — Telephone Encounter (Signed)
Can she have a refill on this 

## 2016-09-12 ENCOUNTER — Ambulatory Visit (INDEPENDENT_AMBULATORY_CARE_PROVIDER_SITE_OTHER): Payer: BLUE CROSS/BLUE SHIELD | Admitting: Family Medicine

## 2016-09-12 ENCOUNTER — Encounter: Payer: Self-pay | Admitting: Family Medicine

## 2016-09-12 VITALS — BP 142/90 | HR 59 | Resp 18 | Wt 228.8 lb

## 2016-09-12 DIAGNOSIS — I10 Essential (primary) hypertension: Secondary | ICD-10-CM | POA: Diagnosis not present

## 2016-09-12 DIAGNOSIS — R110 Nausea: Secondary | ICD-10-CM | POA: Diagnosis not present

## 2016-09-12 LAB — CBC WITH DIFFERENTIAL/PLATELET
Basophils Absolute: 0 cells/uL (ref 0–200)
Basophils Relative: 0 %
Eosinophils Absolute: 106 cells/uL (ref 15–500)
Eosinophils Relative: 2 %
HCT: 42.3 % (ref 35.0–45.0)
Hemoglobin: 12.9 g/dL (ref 11.7–15.5)
Lymphocytes Relative: 40 %
Lymphs Abs: 2120 cells/uL (ref 850–3900)
MCH: 26.7 pg — ABNORMAL LOW (ref 27.0–33.0)
MCHC: 30.5 g/dL — ABNORMAL LOW (ref 32.0–36.0)
MCV: 87.4 fL (ref 80.0–100.0)
MPV: 9.7 fL (ref 7.5–12.5)
Monocytes Absolute: 371 cells/uL (ref 200–950)
Monocytes Relative: 7 %
Neutro Abs: 2703 cells/uL (ref 1500–7800)
Neutrophils Relative %: 51 %
Platelets: 375 10*3/uL (ref 140–400)
RBC: 4.84 MIL/uL (ref 3.80–5.10)
RDW: 13.6 % (ref 11.0–15.0)
WBC: 5.3 10*3/uL (ref 4.0–10.5)

## 2016-09-12 LAB — BASIC METABOLIC PANEL
BUN: 13 mg/dL (ref 7–25)
CO2: 29 mmol/L (ref 20–31)
Calcium: 9.6 mg/dL (ref 8.6–10.4)
Chloride: 105 mmol/L (ref 98–110)
Creat: 0.77 mg/dL (ref 0.50–1.05)
Glucose, Bld: 91 mg/dL (ref 65–99)
Potassium: 4.6 mmol/L (ref 3.5–5.3)
Sodium: 141 mmol/L (ref 135–146)

## 2016-09-12 MED ORDER — ONDANSETRON HCL 4 MG PO TABS: ORAL_TABLET | ORAL | 0 refills | Status: DC

## 2016-09-12 NOTE — Patient Instructions (Signed)
Keep an eye on your blood pressure at home and work. Keep a record of your readings and bring these in to see Lisa Briggs, your PCP.  Watch your salt intake.  Continue taking your reflux medication and avoid any foods or beverages that make this worse.   I am refilling your Zofran for nausea.   Follow up with your PCP in the next 2-3 weeks for hypertension and other chronic health conditions.  See your neurologist as scheduled on July 2nd.     DASH Eating Plan DASH stands for "Dietary Approaches to Stop Hypertension." The DASH eating plan is a healthy eating plan that has been shown to reduce high blood pressure (hypertension). It may also reduce your risk for type 2 diabetes, heart disease, and stroke. The DASH eating plan may also help with weight loss. What are tips for following this plan? General guidelines  Avoid eating more than 2,300 mg (milligrams) of salt (sodium) a day. If you have hypertension, you may need to reduce your sodium intake to 1,500 mg a day.  Limit alcohol intake to no more than 1 drink a day for nonpregnant women and 2 drinks a day for men. One drink equals 12 oz of beer, 5 oz of wine, or 1 oz of hard liquor.  Work with your health care provider to maintain a healthy body weight or to lose weight. Ask what an ideal weight is for you.  Get at least 30 minutes of exercise that causes your heart to beat faster (aerobic exercise) most days of the week. Activities may include walking, swimming, or biking.  Work with your health care provider or diet and nutrition specialist (dietitian) to adjust your eating plan to your individual calorie needs. Reading food labels  Check food labels for the amount of sodium per serving. Choose foods with less than 5 percent of the Daily Value of sodium. Generally, foods with less than 300 mg of sodium per serving fit into this eating plan.  To find whole grains, look for the word "whole" as the first word in the ingredient  list. Shopping  Buy products labeled as "low-sodium" or "no salt added."  Buy fresh foods. Avoid canned foods and premade or frozen meals. Cooking  Avoid adding salt when cooking. Use salt-free seasonings or herbs instead of table salt or sea salt. Check with your health care provider or pharmacist before using salt substitutes.  Do not fry foods. Cook foods using healthy methods such as baking, boiling, grilling, and broiling instead.  Cook with heart-healthy oils, such as olive, canola, soybean, or sunflower oil. Meal planning   Eat a balanced diet that includes: ? 5 or more servings of fruits and vegetables each day. At each meal, try to fill half of your plate with fruits and vegetables. ? Up to 6-8 servings of whole grains each day. ? Less than 6 oz of lean meat, poultry, or fish each day. A 3-oz serving of meat is about the same size as a deck of cards. One egg equals 1 oz. ? 2 servings of low-fat dairy each day. ? A serving of nuts, seeds, or beans 5 times each week. ? Heart-healthy fats. Healthy fats called Omega-3 fatty acids are found in foods such as flaxseeds and coldwater fish, like sardines, salmon, and mackerel.  Limit how much you eat of the following: ? Canned or prepackaged foods. ? Food that is high in trans fat, such as fried foods. ? Food that is high in saturated  fat, such as fatty meat. ? Sweets, desserts, sugary drinks, and other foods with added sugar. ? Full-fat dairy products.  Do not salt foods before eating.  Try to eat at least 2 vegetarian meals each week.  Eat more home-cooked food and less restaurant, buffet, and fast food.  When eating at a restaurant, ask that your food be prepared with less salt or no salt, if possible. What foods are recommended? The items listed may not be a complete list. Talk with your dietitian about what dietary choices are best for you. Grains Whole-grain or whole-wheat bread. Whole-grain or whole-wheat pasta. Brown  rice. [place]. Bulgur. Whole-grain and low-sodium cereals. Pita bread. Low-fat, low-sodium crackers. Whole-wheat flour tortillas. Vegetables Fresh or frozen vegetables (raw, steamed, roasted, or grilled). Low-sodium or reduced-sodium tomato and vegetable juice. Low-sodium or reduced-sodium tomato sauce and tomato paste. Low-sodium or reduced-sodium canned vegetables. Fruits All fresh, dried, or frozen fruit. Canned fruit in natural juice (without added sugar). Meat and other protein foods Skinless chicken or [place]. Ground chicken or [place]. Pork with fat trimmed off. Fish and seafood. Egg whites. Dried beans, peas, or lentils. Unsalted nuts, nut butters, and seeds. Unsalted canned beans. Lean cuts of beef with fat trimmed off. Low-sodium, lean deli meat. Dairy Low-fat (1%) or fat-free (skim) milk. Fat-free, low-fat, or reduced-fat cheeses. Nonfat, low-sodium ricotta or cottage cheese. Low-fat or nonfat yogurt. Low-fat, low-sodium cheese. Fats and oils Soft margarine without trans fats. Vegetable oil. Low-fat, reduced-fat, or light mayonnaise and salad dressings (reduced-sodium). Canola, safflower, olive, soybean, and sunflower oils. Avocado. Seasoning and other foods Herbs. Spices. Seasoning mixes without salt. Unsalted popcorn and pretzels. Fat-free sweets. What foods are not recommended? The items listed may not be a complete list. Talk with your dietitian about what dietary choices are best for you. Grains Baked goods made with fat, such as croissants, muffins, or some breads. Dry pasta or rice meal packs. Vegetables Creamed or fried vegetables. Vegetables in a cheese sauce. Regular canned vegetables (not low-sodium or reduced-sodium). Regular canned tomato sauce and paste (not low-sodium or reduced-sodium). Regular tomato and vegetable juice (not low-sodium or reduced-sodium). Lisa Briggs. Olives. Fruits Canned fruit in a light or heavy syrup. Fried fruit. Fruit in cream or butter  sauce. Meat and other protein foods Fatty cuts of meat. Ribs. Fried meat. Lisa Briggs. Sausage. Bologna and other processed lunch meats. Salami. Fatback. Hotdogs. Bratwurst. Salted nuts and seeds. Canned beans with added salt. Canned or smoked fish. Whole eggs or egg yolks. Chicken or [place] with skin. Dairy Whole or 2% milk, cream, and half-and-half. Whole or full-fat cream cheese. Whole-fat or sweetened yogurt. Full-fat cheese. Nondairy creamers. Whipped toppings. Processed cheese and cheese spreads. Fats and oils Butter. Stick margarine. Lard. Shortening. Ghee. Bacon fat. Tropical oils, such as coconut, palm kernel, or palm oil. Seasoning and other foods Salted popcorn and pretzels. Onion salt, garlic salt, seasoned salt, table salt, and sea salt. Worcestershire sauce. Tartar sauce. Barbecue sauce. Teriyaki sauce. Soy sauce, including reduced-sodium. Steak sauce. Canned and packaged gravies. Fish sauce. Oyster sauce. Cocktail sauce. Horseradish that you find on the shelf. Ketchup. Mustard. Meat flavorings and tenderizers. Bouillon cubes. Hot sauce and Tabasco sauce. Premade or packaged marinades. Premade or packaged taco seasonings. Relishes. Regular salad dressings. Where to find more information:  National Heart, Lung, and [place]: https://wilson-eaton.com/  American Heart Association: www.heart.org Summary  The DASH eating plan is a healthy eating plan that has been shown to reduce high blood pressure (hypertension). It may also reduce  your risk for type 2 diabetes, heart disease, and stroke.  With the DASH eating plan, you should limit salt (sodium) intake to 2,300 mg a day. If you have hypertension, you may need to reduce your sodium intake to 1,500 mg a day.  When on the DASH eating plan, aim to eat more fresh fruits and vegetables, whole grains, lean proteins, low-fat dairy, and heart-healthy fats.  Work with your health care provider or diet and nutrition specialist (dietitian) to adjust  your eating plan to your individual calorie needs. This information is not intended to replace advice given to you by your health care provider. Make sure you discuss any questions you have with your health care provider. Document Released: 03/09/2011 Document Revised: 03/13/2016 Document Reviewed: 03/13/2016 Elsevier Interactive Patient Education  2017 [employer].

## 2016-09-12 NOTE — Progress Notes (Signed)
Subjective:    Patient ID: Lisa Briggs, female    DOB: February 21, 1961, 56 y.o.   MRN: [id number]  HPI Chief Complaint  Patient presents with  . Hypertension    reports still not feeling well after  Friday. BP this am 145/96 to 165/109.    She is here with complaints of elevated BP this morning and intermittent nausea. She has a history of HTN and chronic nausea. Is being treated with medication for both conditions.   States she started to work yesterday morning and she felt sick to her stomach. She also had rhinorrhea. States she did not go work yesterday. This morning she did not have nausea and went to work and she had some generalized weakness. She asked the nurse to check her BP and she was told it was 165/109, 153/103, 145/96.  States her nurse told her to go see her PCP. Her PCP did not have an opening his schedule so she is seeing me.  States she took her Hyzaar at 6:20 am this morning on her way to work. Her BP was taken at 9 am.   States she did not take ondansetron because she no longer has this medication. States she kept it in her locker at work and it was taken by someone.  States normally this medication works for her nausea.   States symptoms from last Friday, headache and tingling resolved.   Denies fever, chills, headache, speech difficulties,  chest pain, palpitations, shortness of breath, abdominal pain, vomiting, diarrhea.  No numbness, tingling or weakness.   States she has an appointment with an orthopedist next week for leg pain.  Reviewed allergies, medications, past medical, surgical,  and social history.     Review of Systems Pertinent positives and negatives in the history of present illness.     Objective:   Physical Exam  Constitutional: She is oriented to person, place, and time. She appears well-developed and well-nourished. No distress.  HENT:  Mouth/Throat: Oropharynx is clear and moist.  Eyes: Conjunctivae and EOM are normal. Pupils are equal,  round, and reactive to light.  Neck: Normal range of motion. Neck supple.  Cardiovascular: Normal rate, regular rhythm, normal heart sounds and normal pulses.   No LE edema.   Pulmonary/Chest: Effort normal and breath sounds normal.  Abdominal: Soft. Bowel sounds are normal. There is no hepatosplenomegaly. There is no tenderness. There is no rigidity, no rebound, no guarding, no CVA tenderness, no tenderness at McBurney's point and negative Murphy's sign.  Musculoskeletal: Normal range of motion.  Lymphadenopathy:    She has no cervical adenopathy.  Neurological: She is alert and oriented to person, place, and time.  Skin: Skin is warm and dry. No pallor.  Psychiatric: She has a normal mood and affect. Her behavior is normal. Thought content normal.   BP (!) 142/90   Pulse (!) 59   Resp 18   Wt 228 lb 12.8 oz (103.8 kg)   LMP 02/23/2015   SpO2 98%   BMI 33.79 kg/m      Assessment & Plan:  Nausea - Plan: CBC with Differential/Platelet, Basic metabolic panel, ondansetron (ZOFRAN) 4 MG tablet  Essential hypertension - Plan: CBC with Differential/Platelet, Basic metabolic panel  Chronic nausea - Plan: ondansetron (ZOFRAN) 4 MG tablet  Discussed that her nausea does not appear to be any different than chronic intermittent nausea.  Abdominal exam is unremarkable. No sign of infectious process.  Refilled her Zofran #30 and will have her follow up with  her PCP for chronic nausea. She will continue treating her GERD and try to avoid food and beverage triggers that make her nausea worse.  BP has improved since this morning per readings she brought in with her. She reports good medication compliance and will have her continue on current medication regimen and follow up with her PCP.  Counseled on DASH diet.  Neurological symptoms from her visit last Friday have resolved. Encouraged her to see her neurologist as scheduled on July 2nd.

## 2016-09-15 ENCOUNTER — Telehealth: Payer: Self-pay | Admitting: Medical

## 2016-09-15 NOTE — Telephone Encounter (Signed)
Put in your folder

## 2016-09-15 NOTE — Telephone Encounter (Signed)
Referring to what medical condition (s).   Print last FMLA

## 2016-09-15 NOTE — Telephone Encounter (Signed)
Pt dropped off FMLA paperwork to  Be filled out states you have filled out her FMLA paperwork for her and the ones she has on file is running out and she needs new ones she can be reached at [phone] with any questions

## 2016-09-20 ENCOUNTER — Encounter: Payer: Self-pay | Admitting: Medical

## 2016-09-20 ENCOUNTER — Ambulatory Visit (INDEPENDENT_AMBULATORY_CARE_PROVIDER_SITE_OTHER): Payer: BLUE CROSS/BLUE SHIELD | Admitting: Medical

## 2016-09-20 VITALS — BP 138/88 | HR 72 | Wt 224.0 lb

## 2016-09-20 DIAGNOSIS — R490 Dysphonia: Secondary | ICD-10-CM

## 2016-09-20 DIAGNOSIS — E785 Hyperlipidemia, unspecified: Secondary | ICD-10-CM

## 2016-09-20 DIAGNOSIS — M1712 Unilateral primary osteoarthritis, left knee: Secondary | ICD-10-CM | POA: Diagnosis not present

## 2016-09-20 DIAGNOSIS — R110 Nausea: Secondary | ICD-10-CM

## 2016-09-20 DIAGNOSIS — M5441 Lumbago with sciatica, right side: Secondary | ICD-10-CM | POA: Diagnosis not present

## 2016-09-20 DIAGNOSIS — R5383 Other fatigue: Secondary | ICD-10-CM | POA: Diagnosis not present

## 2016-09-20 DIAGNOSIS — Z8673 Personal history of transient ischemic attack (TIA), and cerebral infarction without residual deficits: Secondary | ICD-10-CM | POA: Diagnosis not present

## 2016-09-20 DIAGNOSIS — R7301 Impaired fasting glucose: Secondary | ICD-10-CM | POA: Diagnosis not present

## 2016-09-20 DIAGNOSIS — Z8509 Personal history of malignant neoplasm of other digestive organs: Secondary | ICD-10-CM

## 2016-09-20 DIAGNOSIS — K219 Gastro-esophageal reflux disease without esophagitis: Secondary | ICD-10-CM | POA: Diagnosis not present

## 2016-09-20 DIAGNOSIS — M1711 Unilateral primary osteoarthritis, right knee: Secondary | ICD-10-CM | POA: Diagnosis not present

## 2016-09-20 DIAGNOSIS — I10 Essential (primary) hypertension: Secondary | ICD-10-CM

## 2016-09-20 LAB — T4, FREE: Free T4: 1.2 ng/dL (ref 0.8–1.8)

## 2016-09-20 LAB — POCT URINALYSIS DIP (PROADVANTAGE DEVICE)
Bilirubin, UA: NEGATIVE
Blood, UA: NEGATIVE
Glucose, UA: NEGATIVE mg/dL
Nitrite, UA: NEGATIVE
Specific Gravity, Urine: 1.025
Urobilinogen, Ur: NEGATIVE
pH, UA: 6 (ref 5.0–8.0)

## 2016-09-20 LAB — TSH: TSH: 0.56 mIU/L

## 2016-09-20 NOTE — Progress Notes (Signed)
Subjective: Chief Complaint  Patient presents with  . med check    med check and b/p    Here for med check, HTN, and FMLA forms.  Has HTN , is hydrating well, compliant with medication, but has had some elevated readings of late.  Saw orthopedist this morning, had aspiration today from right knee.  Been having knee swelling bilat, right ankle pain.      Feeling nausea sometimes in the mornings ongoing, chronic.  Has hx/o GIST tumor, hasn't seen GI in over a year.   FMLA - having FMLA from ortho regarding legs.   Needs me to complete FMLA regarding f/u visits for BP, and her chronic nausea.    Hx/o stroke - compliant with medication, sees neurology 10/02/16 for follow up.  Eats variety of foods, eats 3 times daily, gets a lot of fruit, eats some fiber bars.  Exercise - active on the job, walking  Has some chronic constipation.  Uses miralax every day.  Has BM typically every 1-2 days.   Past Medical History:  Diagnosis Date  . Allergy   . Anemia    iron therapy for years as of 10/12; normal Hgb 08/2013  . Arthritis   . Chest pain 04/05/2011   cardiac eval, normal treadmill stress test, Dr. Tollie Eth  . Chronic back pain   . Constipation   . Farsightedness    wears glasses, Eye care center  . 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 [name], [facility]  . GERD (gastroesophageal reflux disease)   . History of uterine fibroid   . Hyperlipidemia   . Hypertension   . Paresthesia 09/2014   initially thought to be TIA, neurology consult in 12/2014 with other non TIA considerations.    . Polyarthralgia    normal rheumatoid screen 01/2012   Current Outpatient Prescriptions on File Prior to Visit  Medication Sig Dispense Refill  . aspirin EC 81 MG tablet Take 1 tablet (81 mg total) by mouth daily. 90 tablet 3  . cyclobenzaprine (FLEXERIL) 10 MG tablet TAKE ONE TABLET BY MOUTH TWICE DAILY AS NEEDED FOR  MUSCLE  SPASM 30 tablet 0  .  dexlansoprazole (DEXILANT) 60 MG capsule Take 1 capsule (60 mg total) by mouth daily. 90 capsule 1  . famotidine (PEPCID) 20 MG tablet Take 1 tablet (20 mg total) by mouth at bedtime. 90 tablet 3  . fluticasone (FLONASE) 50 MCG/ACT nasal spray USE ONE SPRAY(S) IN EACH NOSTRIL DAILY AS NEEDED FOR ALLERGIES OR RHINITIS. 16 g 11  . gabapentin (NEURONTIN) 300 MG capsule Take 1 tablet in the afternoon and 2 at bedtime. 90 capsule 5  . loratadine (EQ LORATADINE) 10 MG tablet Take 1 tablet (10 mg total) by mouth daily. 90 tablet 3  . losartan-hydrochlorothiazide (HYZAAR) 50-12.5 MG tablet Take 1 tablet by mouth daily. 90 tablet 3  . meloxicam (MOBIC) 15 MG tablet TAKE 1 TABLET BY MOUTH ONCE DAILY 90 tablet 0  . ondansetron (ZOFRAN) 4 MG tablet TAKE ONE TABLET BY MOUTH EVERY 8 HOURS AS NEEDED FOR NAUSEA OR VOMITING 30 tablet 0  . potassium chloride (KLOR-CON M10) 10 MEQ tablet Take 1 tablet (10 mEq total) by mouth daily. 90 tablet 3  . simvastatin (ZOCOR) 20 MG tablet Take 1 tablet (20 mg total) by mouth daily. 90 tablet 3  . Vitamin D, Ergocalciferol, (DRISDOL) 50000 units CAPS capsule TAKE ONE CAPSULE BY MOUTH EVERY 7 DAYS 4 capsule 5   No current facility-administered  medications on file prior to visit.    ROS as in subjective    Objective: BP 138/88   Pulse 72   Wt 224 lb (101.6 kg)   LMP 02/23/2015   SpO2 98%   BMI 33.08 kg/m   BP Readings from Last 3 Encounters:  09/20/16 138/88  09/12/16 (!) 142/90  09/08/16 140/90   Wt Readings from Last 3 Encounters:  09/20/16 224 lb (101.6 kg)  09/12/16 228 lb 12.8 oz (103.8 kg)  04/28/16 232 lb 7 oz (105.4 kg)   General appearance: alert, no distress, WD/WN,  HEENT: normocephalic, sclerae anicteric, TMs pearly, nares patent, no discharge or erythema, pharynx normal Oral cavity: MMM, no lesions Neck: supple, no lymphadenopathy, no thyromegaly, no masses Heart: RRR, normal S1, S2, no murmurs Lungs: CTA bilaterally, no wheezes, rhonchi, or  rales Abdomen: +bs, soft, port surgical scars, non tender, non distended, no masses, no hepatomegaly, no splenomegaly Pulses: 2+ symmetric, upper and lower extremities, normal cap refill     Assessment: Encounter Diagnoses  Name Primary?  . Essential hypertension Yes  . Gastroesophageal reflux disease without esophagitis   . Impaired fasting blood sugar   . Chronic nausea   . History of TIA (transient ischemic attack)   . History of gastrointestinal stromal tumor (GIST)   . Hyperlipidemia, unspecified hyperlipidemia type   . Hoarseness   . Other fatigue      Plan: Reviewed labs from 09/2016 BMET and CBC, reviewed Vit D, HgbA1C, Lipid from 04/2016.   HTN - c/t same medications GERD, chronic nausea, constipation, hx/o GIST tumor - refer back to GI Hoarseness, fatigue - thyroid labs today Hx/o TIA - c/t risk factor medication.   Completed FMLA given chronic nausea, hx/o stroke, fatigue  Lisa Briggs was seen today for med check.  Diagnoses and all orders for this visit:  Essential hypertension  Gastroesophageal reflux disease without esophagitis  Impaired fasting blood sugar  Chronic nausea  History of TIA (transient ischemic attack)  History of gastrointestinal stromal tumor (GIST) -     POCT Urinalysis DIP (Proadvantage Device)  Hyperlipidemia, unspecified hyperlipidemia type  Hoarseness -     TSH -     T4, free  Other fatigue -     TSH -     T4, free -     Urine Culture

## 2016-09-21 LAB — URINE CULTURE: Organism ID, Bacteria: NO GROWTH

## 2016-09-29 ENCOUNTER — Telehealth: Payer: Self-pay | Admitting: Medical

## 2016-09-29 NOTE — Telephone Encounter (Signed)
Left message for pt the Handicap parking placard form is ready to be picked up

## 2016-10-02 ENCOUNTER — Ambulatory Visit (INDEPENDENT_AMBULATORY_CARE_PROVIDER_SITE_OTHER): Payer: BLUE CROSS/BLUE SHIELD | Admitting: Neurology

## 2016-10-02 ENCOUNTER — Encounter: Payer: Self-pay | Admitting: Neurology

## 2016-10-02 VITALS — BP 110/70 | HR 74 | Ht 69.0 in | Wt 226.1 lb

## 2016-10-02 DIAGNOSIS — M5416 Radiculopathy, lumbar region: Secondary | ICD-10-CM

## 2016-10-02 NOTE — Patient Instructions (Addendum)
MRI lumbar spine without contrast  We will call you with the result and determine the next step

## 2016-10-02 NOTE — Progress Notes (Signed)
Follow-up Visit   Date: 10/02/16    Lisa Briggs MRN: [id number] DOB: [birth year]   Interim History: Lisa Briggs is a 56 y.o.  right-handed African American female with hypertension, GERD, polyarthralgia, hyperlipidemia returning to the clinic for follow-up of right leg pain.  The patient was accompanied to the clinic by self.  History of present illness: Patient reports having a constellation of neurological symptoms which first began in Lisa 2016. She experienced tingling of the face and mouth with right leg heaviness. She went to the emergency room and was found to have low potassium. Symptoms imprved spontaneously. Again, several months later, she developed the same symptoms but this time with bilateral hand paresthesias. Potassium was low again. Most recently, in June she had another spell of facial paresthesias, bilateral leg pain, but this time it was associated with left arm weakness and word-finding difficulty. She was admitted overnight for stroke work-up which returned negative. MRI/A brain, US carotids, and echo was normal.  She also complains of bilateral shooting leg pain, worse on the right that travels down her lateral thigh and lower leg. MRI lumbar spine from 2014 shows multilevel disc disease, as well as mild foraminal encroachment bilaterally at L2-3 and L3-4 as well as broad based right foraminal disc protrusion at L5-S1.  UPDATE 04/29/2015:  She has noticed improvement of her leg pain with gabapentin 300mg  at bedtime, her pain only occurs every few weeks now.  She has not had any further spells of facial paresthesias.  No new headaches.  She is being evaluated for new onset vaginal bleeding by her OBGYN and is tearful about this because of concern of uterine cancer.    UPDATE 04/28/2016:   She is here with worsening right leg radiculopathy.  She has shooting pain that continues to start in the low back and radiates down her thigh and into the sole of the foot.   She was doing better when she was able to do stretching exercises.  She has been taking gabapentin 300mg  at bedtime and takes an extra 1-2 tablets during the day as needed, which makes her a little sleepy.  She denies any weakness or falls.    UPDATE 10/02/2016:  She is here for 6 month appointment.  She went to PT a few times and stopped because of no improvement.  She continues to have chronic right leg pain that starts in her low back and radiates down her leg.  She also has intermittent weakness of the right leg weakness, bilateral leg swelling, and knee pain.  She is requesting sulfasalazine, which her mother uses for her pain; I informed patient that it is not a medication I use for neuropathy and is probably being used for autoimmune arthritis.    Medications:  Current Outpatient Prescriptions on File Prior to Visit  Medication Sig Dispense Refill  . aspirin EC 81 MG tablet Take 1 tablet (81 mg total) by mouth daily. 90 tablet 3  . cyclobenzaprine (FLEXERIL) 10 MG tablet TAKE ONE TABLET BY MOUTH TWICE DAILY AS NEEDED FOR  MUSCLE  SPASM 30 tablet 0  . dexlansoprazole (DEXILANT) 60 MG capsule Take 1 capsule (60 mg total) by mouth daily. 90 capsule 1  . famotidine (PEPCID) 20 MG tablet Take 1 tablet (20 mg total) by mouth at bedtime. 90 tablet 3  . fluticasone (FLONASE) 50 MCG/ACT nasal spray USE ONE SPRAY(S) IN EACH NOSTRIL DAILY AS NEEDED FOR ALLERGIES OR RHINITIS. 16 g 11  . gabapentin (NEURONTIN) 300 MG  capsule Take 1 tablet in the afternoon and 2 at bedtime. 90 capsule 5  . loratadine (EQ LORATADINE) 10 MG tablet Take 1 tablet (10 mg total) by mouth daily. 90 tablet 3  . losartan-hydrochlorothiazide (HYZAAR) 50-12.5 MG tablet Take 1 tablet by mouth daily. 90 tablet 3  . meloxicam (MOBIC) 15 MG tablet TAKE 1 TABLET BY MOUTH ONCE DAILY 90 tablet 0  . ondansetron (ZOFRAN) 4 MG tablet TAKE ONE TABLET BY MOUTH EVERY 8 HOURS AS NEEDED FOR NAUSEA OR VOMITING 30 tablet 0  . potassium chloride  (KLOR-CON M10) 10 MEQ tablet Take 1 tablet (10 mEq total) by mouth daily. 90 tablet 3  . simvastatin (ZOCOR) 20 MG tablet Take 1 tablet (20 mg total) by mouth daily. 90 tablet 3  . Vitamin D, Ergocalciferol, (DRISDOL) 50000 units CAPS capsule TAKE ONE CAPSULE BY MOUTH EVERY 7 DAYS 4 capsule 5   No current facility-administered medications on file prior to visit.     Allergies:  Allergies  Allergen Reactions  . Kiwi Extract Hives and Itching  . Mucinex Dm [Dm-Guaifenesin Er] Nausea Only    Messes up her stomach. Pt report on 01/21/15  . Peach [Prunus Persica] Itching    Peach peeling makes pt itch    Review of Systems:  CONSTITUTIONAL: No fevers, chills, night sweats, or weight loss.  EYES: No visual changes or eye pain ENT: No hearing changes.  No history of nose bleeds.   RESPIRATORY: No cough, wheezing and shortness of breath.   CARDIOVASCULAR: Negative for chest pain, and palpitations.   GI: Negative for abdominal discomfort, blood in stools or black stools.  No recent change in bowel habits.   GU:  No history of incontinence.   MUSCLOSKELETAL: No history of joint pain or swelling.  No myalgias.   SKIN: Negative for lesions, rash, and itching.   ENDOCRINE: Negative for cold or heat intolerance, polydipsia or goiter.   PSYCH:  + depression or anxiety symptoms.   NEURO: As Above.   Vital Signs:  BP 110/70   Pulse 74   Ht 5\' 9"  (1.753 m)   Wt 226 lb 1 oz (102.5 kg)   LMP 02/23/2015   SpO2 96%   BMI 33.38 kg/m   Neurological Exam: MENTAL STATUS including orientation to time, place, person, recent and remote memory, attention span and concentration, language, and fund of knowledge is normal.  Speech is not dysarthric.  CRANIAL NERVES:  Pupils equal round and reactive to light.  Normal conjugate, extra-ocular eye movements in all directions of gaze.  No ptosis.Face is symmetric.   MOTOR:  Motor strength is 5/5 in all extremities.     MSRs:  Reflexes are 2+/4  throughout.  SENSORY:  Intact to vibration.  COORDINATION/GAIT:   Gait mildly wide based due to body habitus.    Data: EEG 12/21/2014:  Normal 30-day cardiac monitor:  Normal  MRI/A brain 09/29/2014: 1. Scattered periventricular and subcortical T2 hyperintensities are slightly greater than expected for age. The finding is nonspecific but can be seen in the setting of chronic microvascular ischemia, a demyelinating process such as multiple sclerosis, vasculitis, complicated migraine headaches, or as the sequelae of a prior infectious or inflammatory process. 2. No acute abnormality. No evidence for acute or subacute infarct. 3. Normal variant MRA circle of Willis without evidence for significant proximal stenosis, aneurysm, or branch vessel occlusion.  Echo 09/30/2014: - Normal EF, Moderate TR, mild MR, otherwise normal study.  US carotids 09/29/2014: 1-39% bilateral ICA stenosis.  MRI lumbar spine wo contrast 05/07/2014:  1. Multilevel disc disease and facet disease. 2. Multilevel lateral recess encroachment. 3. Mild bilateral foraminal encroachment at L2-3 and L3-4. 4. Mild left foraminal stenosis at L4-5. 5. Shallow broad-based right foraminal disc protrusion at L5-S1.  Marland Kitchen IMPRESSION/PLAN: Right leg radiculopathy, most suggestive of S1 radiculopathy vs sciatic neuropathy - worsening  - MRI lumbar spine from 2014 showed degenerative changes, multilevel foraminal stenosis, and disc protrusion at L5-S1 on the right which could potentially be getting worse.  - No improvement with physical therapy  - MRI lumbar spine wo contrast to assess for nerve impingement  - Continue gabapentin to 300mg  in afternoon and 600mg  at bedtime  - She is not interested in [facility] for pain relief  Further recommendations will be based on the results of her testing  The duration of this appointment visit was 25 minutes of face-to-face time with the patient.  Greater than 50% of this time was spent in  counseling, explanation of diagnosis, planning of further management, and coordination of care.   Thank you for allowing me to participate in patient's care.  If I can answer any additional questions, I would be pleased to do so.    Sincerely,    Maiah Sinning K. Posey Pronto, DO

## 2016-10-12 ENCOUNTER — Other Ambulatory Visit: Payer: Self-pay | Admitting: Medical

## 2016-10-13 DIAGNOSIS — M545 Low back pain: Secondary | ICD-10-CM | POA: Diagnosis not present

## 2016-10-15 ENCOUNTER — Other Ambulatory Visit: Payer: BLUE CROSS/BLUE SHIELD

## 2016-10-23 ENCOUNTER — Encounter: Payer: Self-pay | Admitting: Medical

## 2016-10-23 ENCOUNTER — Ambulatory Visit (INDEPENDENT_AMBULATORY_CARE_PROVIDER_SITE_OTHER): Payer: BLUE CROSS/BLUE SHIELD | Admitting: Medical

## 2016-10-23 VITALS — BP 124/76 | HR 70 | Wt 226.3 lb

## 2016-10-23 DIAGNOSIS — Z8673 Personal history of transient ischemic attack (TIA), and cerebral infarction without residual deficits: Secondary | ICD-10-CM | POA: Diagnosis not present

## 2016-10-23 DIAGNOSIS — R809 Proteinuria, unspecified: Secondary | ICD-10-CM | POA: Diagnosis not present

## 2016-10-23 DIAGNOSIS — K219 Gastro-esophageal reflux disease without esophagitis: Secondary | ICD-10-CM

## 2016-10-23 DIAGNOSIS — R109 Unspecified abdominal pain: Secondary | ICD-10-CM | POA: Diagnosis not present

## 2016-10-23 DIAGNOSIS — I10 Essential (primary) hypertension: Secondary | ICD-10-CM

## 2016-10-23 DIAGNOSIS — M5416 Radiculopathy, lumbar region: Secondary | ICD-10-CM | POA: Diagnosis not present

## 2016-10-23 DIAGNOSIS — G8929 Other chronic pain: Secondary | ICD-10-CM

## 2016-10-23 LAB — POCT URINALYSIS DIP (PROADVANTAGE DEVICE)
Bilirubin, UA: NEGATIVE
Blood, UA: NEGATIVE
Glucose, UA: NEGATIVE mg/dL
Ketones, POC UA: NEGATIVE mg/dL
Leukocytes, UA: NEGATIVE
Nitrite, UA: NEGATIVE
Specific Gravity, Urine: 1.03
Urobilinogen, Ur: NEGATIVE
pH, UA: 6 (ref 5.0–8.0)

## 2016-10-23 NOTE — Patient Instructions (Signed)
Recommendations:  We rechecked today on Protein in urine.   There was a trace amount.     I recommend you STOP Meloxicam/Mobic anti-inflammatory arthritis pill.  This can possibly damage your kidney with daily use.  For pain, try using over the counter Acetaminophen/Tylenol instead.  This is safer in regards to your kidney  Continue your routine medications for high blood pressure, acid reflux and other routine medications  Follow up with your back specialist as planned  chronic abdominal pain - we will refer you back for routine follow up with the gastroenterology

## 2016-10-23 NOTE — Progress Notes (Signed)
Subjective: Chief Complaint  Patient presents with  . Follow-up    follow up , ua    Here for recheck.   Last visit we discussed several concerns.  She is here for recheck on protein in UA last visit.    She notes no strenuous exercise or activity in last 24 hours.   Is compliant with medications including BP medication .   Since last visit has not seen GI but she has recently seen neurology and ortho about her ongoing back pains.   Saw Dr. Mayer Camel with [facility] and Dr. Posey Pronto with neurology for eval on back pains.  Just had repeat lumbar MRI last week, awaiting results and f/u with specialist.    GERD - taking Dexilant daily, overall doing ok on this medication and trigger avoidance.    Past Medical History:  Diagnosis Date  . Allergy   . Anemia    iron therapy for years as of 10/12; normal Hgb 08/2013  . Arthritis   . Chest pain 04/05/2011   cardiac eval, normal treadmill stress test, Dr. Tollie Eth  . Chronic back pain   . Constipation   . Farsightedness    wears glasses, [facility] care [facility]  . 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 [name], [facility]  . GERD (gastroesophageal reflux disease)   . History of uterine fibroid   . Hyperlipidemia   . Hypertension   . Paresthesia 09/2014   initially thought to be TIA, neurology consult in 12/2014 with other non TIA considerations.    . Polyarthralgia    normal rheumatoid screen 01/2012   Current Outpatient Prescriptions on File Prior to Visit  Medication Sig Dispense Refill  . aspirin EC 81 MG tablet Take 1 tablet (81 mg total) by mouth daily. 90 tablet 3  . cyclobenzaprine (FLEXERIL) 10 MG tablet TAKE ONE TABLET BY MOUTH TWICE DAILY AS NEEDED FOR  MUSCLE  SPASM 30 tablet 0  . dexlansoprazole (DEXILANT) 60 MG capsule Take 1 capsule (60 mg total) by mouth daily. 90 capsule 1  . famotidine (PEPCID) 20 MG tablet Take 1 tablet (20 mg total) by mouth at bedtime. 90 tablet 3  . fluticasone  (FLONASE) 50 MCG/ACT nasal spray USE ONE SPRAY(S) IN EACH NOSTRIL DAILY AS NEEDED FOR ALLERGIES OR RHINITIS. 16 g 11  . gabapentin (NEURONTIN) 300 MG capsule Take 1 tablet in the afternoon and 2 at bedtime. 90 capsule 5  . loratadine (EQ LORATADINE) 10 MG tablet Take 1 tablet (10 mg total) by mouth daily. 90 tablet 3  . losartan-hydrochlorothiazide (HYZAAR) 50-12.5 MG tablet Take 1 tablet by mouth daily. 90 tablet 3  . meloxicam (MOBIC) 15 MG tablet TAKE 1 TABLET BY MOUTH ONCE DAILY 90 tablet 0  . ondansetron (ZOFRAN) 4 MG tablet TAKE ONE TABLET BY MOUTH EVERY 8 HOURS AS NEEDED FOR NAUSEA OR VOMITING 30 tablet 0  . polyethylene glycol powder (GLYCOLAX/MIRALAX) powder TAKE ONE CAPFUL (17 GRAMS) BY MOUTH DAILY. 850 g 3  . potassium chloride (KLOR-CON M10) 10 MEQ tablet Take 1 tablet (10 mEq total) by mouth daily. 90 tablet 3  . simvastatin (ZOCOR) 20 MG tablet Take 1 tablet (20 mg total) by mouth daily. 90 tablet 3  . Vitamin D, Ergocalciferol, (DRISDOL) 50000 units CAPS capsule TAKE ONE CAPSULE BY MOUTH EVERY 7 DAYS 4 capsule 5   No current facility-administered medications on file prior to visit.     ROS as in subjective   Objective: BP 124/76  Pulse 70   Wt 226 lb 4.8 oz (102.6 kg)   LMP 02/23/2015   SpO2 95%   BMI 33.42 kg/m   BP Readings from Last 3 Encounters:  10/23/16 124/76  10/02/16 110/70  09/20/16 138/88   General appearance: alert, no distress, WD/WN,  Abdomen: +bs, soft, non tender, non distended, no masses, no hepatomegaly, no splenomegaly Pulses: 2+ symmetric, upper and lower extremities, normal cap refill Ext:no edema    Assessment: Encounter Diagnoses  Name Primary?  . Proteinuria, unspecified type Yes  . Essential hypertension   . Gastroesophageal reflux disease without esophagitis   . Chronic abdominal pain   . Chronic radicular lumbar pain   . History of TIA (transient ischemic attack)     Plan: Proteinuria - possible transient.  Trace amount  today.  This hasn't been persistent, however she does have HTN.  Plan to recheck clean catch UA and micro albumin next visit.   Stop Meloxicam, change to acetaminophen for pain relief  HTN - c/t same medication, reviewed last visit and 09/2016 labs  GERD - c/t Dexilant, trigger avoidance  Chronic lumbar pain - f/u with specialist.    Hx/o TIA - c/t same medication, routine f/u  Jahmia was seen today for follow-up.  Diagnoses and all orders for this visit:  Proteinuria, unspecified type -     POCT Urinalysis DIP (Proadvantage Device)  Essential hypertension  Gastroesophageal reflux disease without esophagitis  Chronic abdominal pain  Chronic radicular lumbar pain  History of TIA (transient ischemic attack)   F/u 46mo.

## 2016-10-23 NOTE — Addendum Note (Signed)
Addended by: Tyrone Apple on: 10/23/2016 11:12 AM   Modules accepted: Orders

## 2016-11-09 DIAGNOSIS — M5441 Lumbago with sciatica, right side: Secondary | ICD-10-CM | POA: Diagnosis not present

## 2016-11-09 DIAGNOSIS — M5442 Lumbago with sciatica, left side: Secondary | ICD-10-CM | POA: Diagnosis not present

## 2016-11-16 DIAGNOSIS — M545 Low back pain: Secondary | ICD-10-CM | POA: Diagnosis not present

## 2016-11-16 DIAGNOSIS — M5416 Radiculopathy, lumbar region: Secondary | ICD-10-CM | POA: Diagnosis not present

## 2016-11-19 ENCOUNTER — Other Ambulatory Visit: Payer: Self-pay | Admitting: Medical

## 2016-11-22 DIAGNOSIS — M545 Low back pain: Secondary | ICD-10-CM | POA: Diagnosis not present

## 2016-11-22 DIAGNOSIS — M5416 Radiculopathy, lumbar region: Secondary | ICD-10-CM | POA: Diagnosis not present

## 2016-11-23 ENCOUNTER — Other Ambulatory Visit: Payer: Self-pay | Admitting: Medical

## 2016-11-23 NOTE — Telephone Encounter (Signed)
Is this okay to fill? 

## 2016-11-23 NOTE — Telephone Encounter (Signed)
ok 

## 2016-11-27 DIAGNOSIS — M5416 Radiculopathy, lumbar region: Secondary | ICD-10-CM | POA: Diagnosis not present

## 2016-11-27 DIAGNOSIS — M545 Low back pain: Secondary | ICD-10-CM | POA: Diagnosis not present

## 2016-11-29 ENCOUNTER — Encounter (HOSPITAL_COMMUNITY): Payer: Self-pay | Admitting: Nurse Practitioner

## 2016-11-29 ENCOUNTER — Emergency Department (HOSPITAL_COMMUNITY)
Admission: EM | Admit: 2016-11-29 | Discharge: 2016-11-29 | Disposition: A | Discharge status: Home / Self Care | Payer: BLUE CROSS/BLUE SHIELD | Attending: Emergency Medicine | Admitting: Emergency Medicine

## 2016-11-29 DIAGNOSIS — I10 Essential (primary) hypertension: Secondary | ICD-10-CM | POA: Insufficient documentation

## 2016-11-29 DIAGNOSIS — M545 Low back pain, unspecified: Secondary | ICD-10-CM

## 2016-11-29 DIAGNOSIS — Z79899 Other long term (current) drug therapy: Secondary | ICD-10-CM | POA: Insufficient documentation

## 2016-11-29 DIAGNOSIS — E785 Hyperlipidemia, unspecified: Secondary | ICD-10-CM | POA: Diagnosis not present

## 2016-11-29 DIAGNOSIS — G8929 Other chronic pain: Secondary | ICD-10-CM | POA: Diagnosis not present

## 2016-11-29 DIAGNOSIS — Z8673 Personal history of transient ischemic attack (TIA), and cerebral infarction without residual deficits: Secondary | ICD-10-CM | POA: Insufficient documentation

## 2016-11-29 DIAGNOSIS — Z7982 Long term (current) use of aspirin: Secondary | ICD-10-CM | POA: Insufficient documentation

## 2016-11-29 MED ORDER — PREDNISONE 10 MG PO TABS: ORAL_TABLET | ORAL | 0 refills | Status: DC

## 2016-11-29 MED ORDER — CYCLOBENZAPRINE HCL 10 MG PO TABS: 10.0000 mg | ORAL_TABLET | Freq: Two times a day (BID) | ORAL | 0 refills | Status: DC | PRN

## 2016-11-29 NOTE — ED Provider Notes (Signed)
[facility] DEPT Provider Note   CSN: [id number] Arrival date & time: 11/29/16  0902     History   Chief Complaint Chief Complaint  Patient presents with  . Hip Pain  . Back Pain    HPI Lisa Briggs is a 56 y.o. female.  Pt comes in with chronic low back pain that seemed to worsen after moving boxes. Denies numbness, incontinence or weakness. She states that she is seeing pt for it and she went yesterday and it made symptoms worse. She normally takes flexeril but she ran out.      Past Medical History:  Diagnosis Date  . Allergy   . Anemia    iron therapy for years as of 10/12; normal Hgb 08/2013  . Arthritis   . Chest pain 04/05/2011   cardiac eval, normal treadmill stress test, Dr. Tollie Eth  . Chronic back pain   . Constipation   . Farsightedness    wears glasses, [facility] care center  . 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 [name], [facility]  . GERD (gastroesophageal reflux disease)   . History of uterine fibroid   . Hyperlipidemia   . Hypertension   . Paresthesia 09/2014   initially thought to be TIA, neurology consult in 12/2014 with other non TIA considerations.    . Polyarthralgia    normal rheumatoid screen 01/2012    Patient Active Problem List   Diagnosis Date Noted  . History of gastrointestinal stromal tumor (GIST) 04/20/2016  . History of TIA (transient ischemic attack) 04/20/2016  . Screening for breast cancer 04/20/2016  . Estrogen deficiency 04/20/2016  . Chronic maxillary sinusitis 04/20/2016  . Impaired fasting blood sugar 04/20/2016  . Constipation 04/20/2016  . History of fall 04/20/2016  . Chronic radicular lumbar pain 01/27/2016  . Encounter for health maintenance examination in adult 03/22/2015  . Facial paresthesia 03/22/2015  . Cognitive decline 03/22/2015  . Screening for cervical cancer 03/22/2015  . Vitamin D deficiency 03/22/2015  . History of uterine leiomyoma 03/22/2015  .  Gastroesophageal reflux disease without esophagitis 03/02/2014  . Chronic nausea 03/02/2014  . Chronic abdominal pain 03/02/2014  . Rhinitis, allergic 03/02/2014  . Essential hypertension 03/02/2014  . Hyperlipidemia 03/02/2014    Past Surgical History:  Procedure Laterality Date  . COLONOSCOPY  01/2014   diverticulosis, othwerise normal - Dr. Owens Loffler  . ESOPHAGOGASTRODUODENOSCOPY  2013   Dr. Benson Norway, gastritis  . ESOPHAGOGASTRODUODENOSCOPY  [id number]  . EUS N/A 04/16/2014   Procedure: UPPER ENDOSCOPIC ULTRASOUND (EUS) LINEAR;  Surgeon: Milus Banister, MD;  Location: [facility] END[facility]COPY;  Service: Endoscopy;  Laterality: N/A;  . gall stone surgery    . KNEE ARTHROSCOPY Left   . LAPAROSCOPIC GASTRIC RESECTION N/A 06/09/2014   Procedure: LAPAROSCOPIC GASTRIC MASS RESECTION;  Surgeon: [name], MD;  Location: [facility] ORS;  Service: General;  Laterality: N/A;  . LIPOMA EXCISION     forehead  . UTERINE FIBROID SURGERY      OB History    No data available       Home Medications    Prior to Admission medications   Medication Sig Start Date End Date Taking? Authorizing Provider  aspirin EC 81 MG tablet Take 1 tablet (81 mg total) by mouth daily. 04/21/16   Tysinger, Camelia Eng, PA-C  cyclobenzaprine (FLEXERIL) 10 MG tablet Take 1 tablet (10 mg total) by mouth 2 (two) times daily as needed for muscle spasms. 11/29/16   Glendell Docker, NP  dexlansoprazole (  DEXILANT) 60 MG capsule Take 1 capsule (60 mg total) by mouth daily. 04/21/16   Tysinger, Camelia Eng, PA-C  famotidine (PEPCID) 20 MG tablet Take 1 tablet (20 mg total) by mouth at bedtime. 04/21/16   Tysinger, Camelia Eng, PA-C  fluticasone (FLONASE) 50 MCG/ACT nasal spray USE ONE SPRAY(S) IN EACH NOSTRIL DAILY AS NEEDED FOR ALLERGIES OR RHINITIS. 12/08/15   Tysinger, Camelia Eng, PA-C  gabapentin (NEURONTIN) 300 MG capsule Take 1 tablet in the afternoon and 2 at bedtime. 04/28/16   Narda Amber K, DO  loratadine (EQ LORATADINE) 10 MG tablet Take 1 tablet  (10 mg total) by mouth daily. 04/21/16   Tysinger, Camelia Eng, PA-C  losartan-hydrochlorothiazide (HYZAAR) 50-12.5 MG tablet Take 1 tablet by mouth daily. 04/21/16   Tysinger, Camelia Eng, PA-C  meloxicam (MOBIC) 15 MG tablet TAKE 1 TABLET BY MOUTH ONCE DAILY 09/11/16   Tysinger, Camelia Eng, PA-C  ondansetron (ZOFRAN) 4 MG tablet TAKE ONE TABLET BY MOUTH EVERY 8 HOURS AS NEEDED FOR NAUSEA OR VOMITING 09/12/16   Henson, Vickie L, NP-C  polyethylene glycol powder (GLYCOLAX/MIRALAX) powder TAKE ONE CAPFUL (17 GRAMS) BY MOUTH DAILY. 10/13/16   Tysinger, Camelia Eng, PA-C  potassium chloride (KLOR-CON M10) 10 MEQ tablet Take 1 tablet (10 mEq total) by mouth daily. 04/21/16   Tysinger, Camelia Eng, PA-C  predniSONE (DELTASONE) 10 MG tablet Six day step down dose 11/29/16   Glendell Docker, NP  simvastatin (ZOCOR) 20 MG tablet Take 1 tablet (20 mg total) by mouth daily. 04/21/16   Tysinger, Camelia Eng, PA-C  Vitamin D, Ergocalciferol, (DRISDOL) 50000 units CAPS capsule TAKE ONE CAPSULE BY MOUTH EVERY 7 DAYS 09/04/16   Tysinger, Camelia Eng, PA-C    Family History Family History  Problem Relation Age of Onset  . Hypertension Mother   . Arthritis Mother   . GER disease Mother   . Glaucoma Mother   . Stroke Father   . Alcohol abuse Father   . Breast cancer Sister        breast cancer dx late 90s; another sister with leukemia  . Hypertension Sister   . Heart disease Sister        heart murmur; other sisters with heart problems  . Diabetes Brother   . Colon cancer Brother 4  . Diabetes Maternal Aunt   . Heart disease Maternal Aunt   . Heart disease Maternal Grandmother   . Heart disease Maternal Grandfather   . Heart disease Maternal Uncle   . Stroke Maternal Uncle   . Colon polyps Neg Hx     Social History Social History  Substance Use Topics  . Smoking status: Never Smoker  . Smokeless tobacco: Never Used  . Alcohol use No     Allergies   Kiwi extract; Mucinex dm [dm-guaifenesin er]; and Peach [prunus  persica]   Review of Systems Review of Systems  All other systems reviewed and are negative.    Physical Exam Updated Vital Signs BP 118/83   Pulse 82   Temp (!) 97.4 F (36.3 C) (Oral)   Resp 16   Ht 5\' 8"  (1.727 m)   Wt 102.1 kg (225 lb)   LMP 02/23/2015   SpO2 100%   BMI 34.21 kg/m   Physical Exam  Constitutional: She is oriented to person, place, and time. She appears well-developed and well-nourished.  Cardiovascular: Normal rate.   Pulmonary/Chest: Effort normal and breath sounds normal.  Musculoskeletal: Normal range of motion.  Tenderness bilaterally to mid to lower  back pain. Good strength and sensation to bilateral lower extremities  Neurological: She is alert and oriented to person, place, and time.  Skin: Skin is warm and dry.  Psychiatric: She has a normal mood and affect.  Nursing note and vitals reviewed.    ED Treatments / Results  Labs (all labs ordered are listed, but only abnormal results are displayed) Labs Reviewed - No data to display  EKG  EKG Interpretation None       Radiology No results found.  Procedures Procedures (including critical care time)  Medications Ordered in ED Medications - No data to display   Initial Impression / Assessment and Plan / ED Course  I have reviewed the triage vital signs and the nursing notes.  Pertinent labs & imaging results that were available during my care of the patient were reviewed by me and considered in my medical decision making (see chart for details).     No red flag symptoms. This is chronic in nature. Will restart on flexeril and give prednisone to help with symptoms. She is to see Dr. Mayer Camel again in 2 days  Final Clinical Impressions(s) / ED Diagnoses   Final diagnoses:  Chronic bilateral low back pain without sciatica    New Prescriptions New Prescriptions   CYCLOBENZAPRINE (FLEXERIL) 10 MG TABLET    Take 1 tablet (10 mg total) by mouth 2 (two) times daily as needed for  muscle spasms.   PREDNISONE (DELTASONE) 10 MG TABLET    Six day step down dose     Glendell Docker, NP 11/29/16 1032    Fredia Sorrow, MD 11/29/16 1042

## 2016-11-29 NOTE — ED Triage Notes (Signed)
Pt sts she was pushing heavy boxes Sunday and noted Monday she had left hip pain and lower back pain that was more painful with bending. Pt denies n/v/d/urinary symptoms, vaginal bleeding/discharge. Pt sts took her gabapentin without relief of symptoms and muscle spasm medicine with moderate relief yesterday. Pt sts she did not try tylenol or ibuprofen for pain.

## 2016-11-30 DIAGNOSIS — M545 Low back pain: Secondary | ICD-10-CM | POA: Diagnosis not present

## 2016-12-01 ENCOUNTER — Telehealth: Payer: Self-pay

## 2016-12-01 ENCOUNTER — Other Ambulatory Visit: Payer: Self-pay | Admitting: Medical

## 2016-12-01 DIAGNOSIS — M5416 Radiculopathy, lumbar region: Secondary | ICD-10-CM | POA: Diagnosis not present

## 2016-12-01 MED ORDER — CYCLOBENZAPRINE HCL 10 MG PO TABS: 10.0000 mg | ORAL_TABLET | Freq: Two times a day (BID) | ORAL | 0 refills | Status: DC | PRN

## 2016-12-01 NOTE — Telephone Encounter (Signed)
Pt called and wants to know if she can refill on his flexeril ?

## 2016-12-06 DIAGNOSIS — M5416 Radiculopathy, lumbar region: Secondary | ICD-10-CM | POA: Diagnosis not present

## 2016-12-07 DIAGNOSIS — M545 Low back pain: Secondary | ICD-10-CM | POA: Diagnosis not present

## 2016-12-07 DIAGNOSIS — M5416 Radiculopathy, lumbar region: Secondary | ICD-10-CM | POA: Diagnosis not present

## 2016-12-11 DIAGNOSIS — M5416 Radiculopathy, lumbar region: Secondary | ICD-10-CM | POA: Diagnosis not present

## 2016-12-11 DIAGNOSIS — M545 Low back pain: Secondary | ICD-10-CM | POA: Diagnosis not present

## 2016-12-13 DIAGNOSIS — M5416 Radiculopathy, lumbar region: Secondary | ICD-10-CM | POA: Diagnosis not present

## 2016-12-13 DIAGNOSIS — M545 Low back pain: Secondary | ICD-10-CM | POA: Diagnosis not present

## 2016-12-19 DIAGNOSIS — M5416 Radiculopathy, lumbar region: Secondary | ICD-10-CM | POA: Diagnosis not present

## 2016-12-19 DIAGNOSIS — M545 Low back pain: Secondary | ICD-10-CM | POA: Diagnosis not present

## 2016-12-21 DIAGNOSIS — M5416 Radiculopathy, lumbar region: Secondary | ICD-10-CM | POA: Diagnosis not present

## 2016-12-26 DIAGNOSIS — M5416 Radiculopathy, lumbar region: Secondary | ICD-10-CM | POA: Diagnosis not present

## 2016-12-26 DIAGNOSIS — M545 Low back pain: Secondary | ICD-10-CM | POA: Diagnosis not present

## 2016-12-28 DIAGNOSIS — M545 Low back pain: Secondary | ICD-10-CM | POA: Diagnosis not present

## 2016-12-28 DIAGNOSIS — M5416 Radiculopathy, lumbar region: Secondary | ICD-10-CM | POA: Diagnosis not present

## 2017-01-09 ENCOUNTER — Encounter: Payer: Self-pay | Admitting: Gastroenterology

## 2017-01-09 ENCOUNTER — Ambulatory Visit (INDEPENDENT_AMBULATORY_CARE_PROVIDER_SITE_OTHER): Payer: BLUE CROSS/BLUE SHIELD | Admitting: Gastroenterology

## 2017-01-09 VITALS — BP 90/70 | HR 80 | Ht 69.0 in | Wt 216.6 lb

## 2017-01-09 DIAGNOSIS — K219 Gastro-esophageal reflux disease without esophagitis: Secondary | ICD-10-CM | POA: Diagnosis not present

## 2017-01-09 DIAGNOSIS — R110 Nausea: Secondary | ICD-10-CM

## 2017-01-09 NOTE — Progress Notes (Signed)
Review of pertinent gastrointestinal problems: 1. Family history of colon cancer, brother:  Colonoscopy Dr. Ardis Hughs 01/2014 found diverticulosis but no polyps, recommended recall colonoscopy at 5 year interval. 2. Gastritis on EGD Dr. Benson Norway 02/2012, biopsy results not available at time of 12/15 office apt 3. Cholelithiasis; on [place] 2012  4. Incidental gastric wall mass (CT 2016). Followed by EUS 04/2014 Dr. 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 S/o 06/2014 lap resection Dr. Rosendo Gros 3cm GIST with low mitotic activity and clear margins.   HPI: This is a very pleasant 56 year old woman whom I last saw about 2 years ago  Has intermittent nausea.  This can occur daily. usually in AM when she feels it.  No vomiting.  Can have waterbrash at times.  No dysphagia.  Lately weight stable.  She takes dexilant every morning. Pepcid is on her medicine list but she does not routinely take it.  No overt GI bleeding  Chief complaint is nausea, GERD  ROS: complete GI ROS as described in HPI, all other review negative.  Constitutional:  No unintentional weight loss   Past Medical History:  Diagnosis Date  . Allergy   . Anemia    iron therapy for years as of 10/12; normal Hgb 08/2013  . Arthritis   . Chest pain 04/05/2011   cardiac eval, normal treadmill stress test, Dr. Tollie Eth  . Chronic back pain   . Constipation   . Farsightedness    wears glasses, [facility] care center  . 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 [name], [facility] Surgery  . GERD (gastroesophageal reflux disease)   . History of uterine fibroid   . Hyperlipidemia   . Hypertension   . Paresthesia 09/2014   initially thought to be TIA, neurology consult in 12/2014 with other non TIA considerations.    . Polyarthralgia    normal rheumatoid screen 01/2012    Past Surgical History:  Procedure Laterality Date  .  COLONOSCOPY  01/2014   diverticulosis, othwerise normal - Dr. Owens Loffler  . ESOPHAGOGASTRODUODENOSCOPY  2013   Dr. Benson Norway, gastritis  . ESOPHAGOGASTRODUODENOSCOPY  [id number]  . EUS N/A 04/16/2014   Procedure: UPPER ENDOSCOPIC ULTRASOUND (EUS) LINEAR;  Surgeon: Milus Banister, MD;  Location: [facility];  Service: Endoscopy;  Laterality: N/A;  . gall stone surgery    . KNEE ARTHROSCOPY Left   . LAPAROSCOPIC GASTRIC RESECTION N/A 06/09/2014   Procedure: LAPAROSCOPIC GASTRIC MASS RESECTION;  Surgeon: [name], MD;  Location: [facility];  Service: General;  Laterality: N/A;  . LIPOMA EXCISION     forehead  . UTERINE FIBROID SURGERY      Current Outpatient Prescriptions  Medication Sig Dispense Refill  . aspirin EC 81 MG tablet Take 1 tablet (81 mg total) by mouth daily. 90 tablet 3  . cyclobenzaprine (FLEXERIL) 10 MG tablet Take 1 tablet (10 mg total) by mouth 2 (two) times daily as needed for muscle spasms. 20 tablet 0  . dexlansoprazole (DEXILANT) 60 MG capsule Take 1 capsule (60 mg total) by mouth daily. 90 capsule 1  . famotidine (PEPCID) 20 MG tablet Take 1 tablet (20 mg total) by mouth at bedtime. 90 tablet 3  . fluticasone (FLONASE) 50 MCG/ACT nasal spray USE ONE SPRAY(S) IN EACH NOSTRIL DAILY AS NEEDED FOR ALLERGIES OR RHINITIS. 16 g 11  . gabapentin (NEURONTIN) 300 MG capsule Take 1 tablet in the afternoon and  2 at bedtime. 90 capsule 5  . loratadine (EQ LORATADINE) 10 MG tablet Take 1 tablet (10 mg total) by mouth daily. 90 tablet 3  . losartan-hydrochlorothiazide (HYZAAR) 50-12.5 MG tablet Take 1 tablet by mouth daily. 90 tablet 3  . meloxicam (MOBIC) 15 MG tablet TAKE 1 TABLET BY MOUTH ONCE DAILY 90 tablet 0  . ondansetron (ZOFRAN) 4 MG tablet TAKE ONE TABLET BY MOUTH EVERY 8 HOURS AS NEEDED FOR NAUSEA OR VOMITING 30 tablet 0  . polyethylene glycol powder (GLYCOLAX/MIRALAX) powder TAKE ONE CAPFUL (17 GRAMS) BY MOUTH DAILY. 850 g 3  . potassium chloride (KLOR-CON M10) 10 MEQ  tablet Take 1 tablet (10 mEq total) by mouth daily. 90 tablet 3  . simvastatin (ZOCOR) 20 MG tablet Take 1 tablet (20 mg total) by mouth daily. 90 tablet 3  . tiZANidine (ZANAFLEX) 4 MG tablet Take 1 tablet by mouth daily.    Marland Kitchen venlafaxine (EFFEXOR) 37.5 MG tablet Take 1 tablet by mouth daily.    . Vitamin D, Ergocalciferol, (DRISDOL) 50000 units CAPS capsule TAKE ONE CAPSULE BY MOUTH EVERY 7 DAYS 4 capsule 5   No current facility-administered medications for this visit.     Allergies as of 01/09/2017 - Review Complete 01/09/2017  Allergen Reaction Noted  . Kiwi extract Hives and Itching 09/27/2015  . Mucinex dm [dm-guaifenesin er] Nausea Only 01/21/2015  . Peach [prunus persica] Itching 09/27/2015    Family History  Problem Relation Age of Onset  . Hypertension Mother   . Arthritis Mother   . GER disease Mother   . Glaucoma Mother   . Stroke Father   . Alcohol abuse Father   . Breast cancer Sister        breast cancer dx late 12s; another sister with leukemia  . Hypertension Sister   . Heart disease Sister        heart murmur; other sisters with heart problems  . Diabetes Brother   . Colon cancer Brother 76  . Diabetes Maternal Aunt   . Heart disease Maternal Aunt   . Heart disease Maternal Grandmother   . Heart disease Maternal Grandfather   . Heart disease Maternal Uncle   . Stroke Maternal Uncle   . Colon polyps Neg Hx     Social History   Social History  . Marital status: Married    Spouse name: N/A  . Number of children: 1  . Years of education: N/A   Occupational History  . Machine operator [employer]   Social History Main Topics  . Smoking status: Never Smoker  . Smokeless tobacco: Never Used  . Alcohol use No  . Drug use: No  . Sexual activity: Not on file   Other Topics Concern  . Not on file   Social History Narrative   Married, has 1 son in [place] and some grandchildren.  Glass blower/designer.  Active on job.  Does stretching and exercises daily as  per physical therapy.  Works 12 hours daily.       Physical Exam: BP 90/70   Pulse 80   Ht 5\' 9"  (1.753 m)   Wt 216 lb 9.6 oz (98.2 kg)   LMP 02/23/2015   BMI 31.99 kg/m  Constitutional: generally well-appearing Psychiatric: alert and oriented x3 Abdomen: soft, nontender, nondistended, no obvious ascites, no peritoneal signs, normal bowel sounds No peripheral edema noted in lower extremities  Assessment and plan: 56 y.o. female with Chronic GERD, morning nausea  I recommended that she resume taking  Pepcid at bedtime every night. This is particularly good for overnight acid suppression in morning symptoms. She will continue taking her proton pump inhibitor in the morning. She will call to report on her response in 3-4 weeks. She has no alarm symptoms.  Please see the "Patient Instructions" section for addition details about the plan.  Owens Loffler, MD [facility] 01/09/2017, 8:48 AM

## 2017-01-09 NOTE — Patient Instructions (Addendum)
Please resume your pepcid at BEDTIME every night.  Call in 3-4 weeks to report.  Normal BMI (Body Mass Index- based on height and weight) is between 19 and 25. Your BMI today is Body mass index is 31.99 kg/m. Marland Kitchen Please consider follow up  regarding your BMI with your Primary Care Provider.

## 2017-01-18 DIAGNOSIS — M5416 Radiculopathy, lumbar region: Secondary | ICD-10-CM | POA: Diagnosis not present

## 2017-01-23 ENCOUNTER — Ambulatory Visit (INDEPENDENT_AMBULATORY_CARE_PROVIDER_SITE_OTHER): Payer: BLUE CROSS/BLUE SHIELD | Admitting: Medical

## 2017-01-23 ENCOUNTER — Encounter: Payer: Self-pay | Admitting: Medical

## 2017-01-23 VITALS — BP 132/80 | HR 74 | Wt 220.2 lb

## 2017-01-23 DIAGNOSIS — E785 Hyperlipidemia, unspecified: Secondary | ICD-10-CM

## 2017-01-23 DIAGNOSIS — Z8673 Personal history of transient ischemic attack (TIA), and cerebral infarction without residual deficits: Secondary | ICD-10-CM

## 2017-01-23 DIAGNOSIS — I10 Essential (primary) hypertension: Secondary | ICD-10-CM

## 2017-01-23 DIAGNOSIS — R809 Proteinuria, unspecified: Secondary | ICD-10-CM | POA: Insufficient documentation

## 2017-01-23 DIAGNOSIS — R203 Hyperesthesia: Secondary | ICD-10-CM | POA: Insufficient documentation

## 2017-01-23 DIAGNOSIS — K219 Gastro-esophageal reflux disease without esophagitis: Secondary | ICD-10-CM

## 2017-01-23 DIAGNOSIS — Z23 Encounter for immunization: Secondary | ICD-10-CM

## 2017-01-23 DIAGNOSIS — E669 Obesity, unspecified: Secondary | ICD-10-CM | POA: Diagnosis not present

## 2017-01-23 LAB — POCT URINALYSIS DIP (PROADVANTAGE DEVICE)
Bilirubin, UA: NEGATIVE
Blood, UA: NEGATIVE
Glucose, UA: NEGATIVE mg/dL
Ketones, POC UA: NEGATIVE mg/dL
Leukocytes, UA: NEGATIVE
Nitrite, UA: NEGATIVE
Protein Ur, POC: NEGATIVE mg/dL
Specific Gravity, Urine: 1.015
Urobilinogen, Ur: NEGATIVE
pH, UA: 8 (ref 5.0–8.0)

## 2017-01-23 MED ORDER — PHENTERMINE-TOPIRAMATE ER 3.75-23 MG PO CP24: 1.0000 | ORAL_CAPSULE | ORAL | 0 refills | Status: DC

## 2017-01-23 MED ORDER — PHENTERMINE-TOPIRAMATE ER 7.5-46 MG PO CP24: 1.0000 | ORAL_CAPSULE | ORAL | 1 refills | Status: DC

## 2017-01-23 MED ORDER — HYDROCORTISONE 1 % RE CREA: 1.0000 "application " | TOPICAL_CREAM | Freq: Every day | RECTAL | 1 refills | Status: DC

## 2017-01-23 NOTE — Progress Notes (Signed)
Subjective: Chief Complaint  Patient presents with  . Follow-up    3 month follow up , discuss refill on cream    Here for 94mo f/u on proteinuria and general f/u.    Last visit we advised her to avoid meloxicam which she is doing, but she was given ibuprofen by sports medicine doctor recently  Obesity - Eats mostly food prepared at home.  Does eat some chicken fast food at times.  Bakes most of her foods.   Avoids added salt.  Does some walking for exercise.  Wants refill on rash cream for sensitive skin.   She uses this on the face.  Was given by dermatology in the past. Not sure of the name of the medication.    She saw GI recently for f/u.  She has questions about next colonoscopy  No other aggravating or relieving factors. No other complaint.  Past Medical History:  Diagnosis Date  . Allergy   . Anemia    iron therapy for years as of 10/12; normal Hgb 08/2013  . Arthritis   . Chest pain 04/05/2011   cardiac eval, normal treadmill stress test, Dr. Tollie Eth  . Chronic back pain   . Constipation   . Farsightedness    wears glasses, Eye care center  . 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 [name], [facility] Surgery  . GERD (gastroesophageal reflux disease)   . History of uterine fibroid   . Hyperlipidemia   . Hypertension   . Paresthesia 09/2014   initially thought to be TIA, neurology consult in 12/2014 with other non TIA considerations.    . Polyarthralgia    normal rheumatoid screen 01/2012   Current Outpatient Prescriptions on File Prior to Visit  Medication Sig Dispense Refill  . aspirin EC 81 MG tablet Take 1 tablet (81 mg total) by mouth daily. 90 tablet 3  . cyclobenzaprine (FLEXERIL) 10 MG tablet Take 1 tablet (10 mg total) by mouth 2 (two) times daily as needed for muscle spasms. 20 tablet 0  . dexlansoprazole (DEXILANT) 60 MG capsule Take 1 capsule (60 mg total) by mouth daily. 90 capsule 1  . famotidine (PEPCID) 20 MG  tablet Take 1 tablet (20 mg total) by mouth at bedtime. 90 tablet 3  . fluticasone (FLONASE) 50 MCG/ACT nasal spray USE ONE SPRAY(S) IN EACH NOSTRIL DAILY AS NEEDED FOR ALLERGIES OR RHINITIS. 16 g 11  . gabapentin (NEURONTIN) 300 MG capsule Take 1 tablet in the afternoon and 2 at bedtime. 90 capsule 5  . losartan-hydrochlorothiazide (HYZAAR) 50-12.5 MG tablet Take 1 tablet by mouth daily. 90 tablet 3  . ondansetron (ZOFRAN) 4 MG tablet TAKE ONE TABLET BY MOUTH EVERY 8 HOURS AS NEEDED FOR NAUSEA OR VOMITING 30 tablet 0  . polyethylene glycol powder (GLYCOLAX/MIRALAX) powder TAKE ONE CAPFUL (17 GRAMS) BY MOUTH DAILY. 850 g 3  . potassium chloride (KLOR-CON M10) 10 MEQ tablet Take 1 tablet (10 mEq total) by mouth daily. 90 tablet 3  . simvastatin (ZOCOR) 20 MG tablet Take 1 tablet (20 mg total) by mouth daily. 90 tablet 3  . venlafaxine (EFFEXOR) 37.5 MG tablet Take 1 tablet by mouth daily.    . Vitamin D, Ergocalciferol, (DRISDOL) 50000 units CAPS capsule TAKE ONE CAPSULE BY MOUTH EVERY 7 DAYS 4 capsule 5  . loratadine (EQ LORATADINE) 10 MG tablet Take 1 tablet (10 mg total) by mouth daily. 90 tablet 3   No current facility-administered medications on file prior to  visit.    Past Surgical History:  Procedure Laterality Date  . COLONOSCOPY  01/2014   diverticulosis, othwerise normal - Dr. Owens Loffler  . ESOPHAGOGASTRODUODENOSCOPY  2013   Dr. Benson Norway, gastritis  . ESOPHAGOGASTRODUODENOSCOPY  [id number]  . EUS N/A 04/16/2014   Procedure: UPPER ENDOSCOPIC ULTRASOUND (EUS) LINEAR;  Surgeon: Milus Banister, MD;  [facility];  Service: Endoscopy;  Laterality: N/A;  . gall stone surgery    . KNEE ARTHROSCOPY Left   . LAPAROSCOPIC GASTRIC RESECTION N/A 06/09/2014   Procedure: LAPAROSCOPIC GASTRIC MASS RESECTION;  Surgeon: [name], MD;  Location: [facility];  Service: General;  Laterality: N/A;  . LIPOMA EXCISION     forehead  . UTERINE FIBROID SURGERY     ROS as in  subjective     Objective: BP 132/80   Pulse 74   Wt 220 lb 3.2 oz (99.9 kg)   LMP 02/23/2015   SpO2 95%   BMI 32.52 kg/m   Wt Readings from Last 3 Encounters:  01/23/17 220 lb 3.2 oz (99.9 kg)  01/09/17 216 lb 9.6 oz (98.2 kg)  11/29/16 225 lb (102.1 kg)   BP Readings from Last 3 Encounters:  01/23/17 132/80  01/09/17 90/70  11/29/16 118/83   General appearence: alert, no distress, WD/WN,  Oral cavity: MMM, no lesions Neck: supple, no lymphadenopathy, no thyromegaly, no masses Heart: RRR, normal S1, S2, no murmurs Lungs: CTA bilaterally, no wheezes, rhonchi, or rales Pulses: 2+ symmetric, upper and lower extremities, normal cap refill Ext: no edema   Assessment: Encounter Diagnoses  Name Primary?  . Essential hypertension Yes  . Hyperlipidemia, unspecified hyperlipidemia type   . Proteinuria, unspecified type   . Need for influenza vaccination   . Sensitive skin   . Gastroesophageal reflux disease without esophagitis   . History of TIA (transient ischemic attack)   . Class 1 obesity with serious comorbidity in adult, unspecified BMI, unspecified obesity type     Plan: HTN - compliant with medication, c/t same medications  hyperlipidemia - c/t same medication  Proteinuria - resolved.  Continue to monitor periodically  Counseled on the influenza virus vaccine.  Vaccine information sheet given.  Influenza vaccine given after consent obtained.  Sensitive skin - discussed proper use of medication, risks/benefits of medication. Refill sent.   GERD - reviewed recent GI consult notes.   She continues on PPI in the morning and H2 blocker in evening  Hx/o TIA - c/t same medications  Obesity - counseled on diet, exercise, need for weight loss improvements.  Begin trial of Qsymia.  Discussed risks/benefints, goals  Of note, flexeril and tizanidine was listed in chart.  D/C tizanidine.   She is not taking this.      [name] was seen today for  follow-up.  Diagnoses and all orders for this visit:  Essential hypertension  Hyperlipidemia, unspecified hyperlipidemia type -     POCT Urinalysis DIP (Proadvantage Device)  Proteinuria, unspecified type  Need for influenza vaccination -     Flu Vaccine QUAD 36+ mos IM  Sensitive skin  Gastroesophageal reflux disease without esophagitis  History of TIA (transient ischemic attack)  Class 1 obesity with serious comorbidity in adult, unspecified BMI, unspecified obesity type  Other orders -     Phentermine-Topiramate (QSYMIA) 3.75-23 MG CP24; Take 1 capsule by mouth every morning. -     Phentermine-Topiramate (QSYMIA) 7.5-46 MG CP24; Take 1 capsule by mouth every morning. -     hydrocortisone (PROCTOCORT) 1 %  CREA; Apply 1 application topically daily.

## 2017-01-23 NOTE — Patient Instructions (Signed)
  Thank you for giving me the opportunity to serve you today.    Your diagnosis today includes: Encounter Diagnoses  Name Primary?  . Essential hypertension Yes  . Hyperlipidemia, unspecified hyperlipidemia type   . Proteinuria, unspecified type   . Need for influenza vaccination   . Sensitive skin   . Gastroesophageal reflux disease without esophagitis   . History of TIA (transient ischemic attack)   . Class 1 obesity with serious comorbidity in adult, unspecified BMI, unspecified obesity type      Specific recommendations today include: Diet  Increase your water intake, get at least 64 ounces of water daily  Eat 3-4 fruits daily  Eat plenty of vegetables throughout the day, preferably each meal  Eat good sources of grains such as oatmeal, barley, whole grain pasta, whole grain bread, but limit the serving size to 1 cup of oatmeal or pasta per meal or 2 slices of bread per meal  We don't need to meat at each meal, however if you do eat meat, limit serving size to the size of your palm, and eat chicken fish or [place], lean cuts of meat  Eat beans every day as this is a good nutrient source and helps to curb appetite  Consider using a program such as Weight Watchers  Consider using a Smart phone app such as My Fitness [employer] or [employer] to track your calories and progress   Things to limit or avoid:  Avoid fast food, fried foods, fatty foods  Limit sweets, ice cream, cake and other baked goods  Avoid soda, beer, alcohol, sweet tea  Exercise  You need to be exercising most days of the week for 30-45 minutes or more  Good forms of exercise include walking, hiking, stationary bike or bicycling outside, lap swimming, aerobics class, dance, Zumba  Consider getting a trainer at a gym to help with exercise  Medication  Begin Qsymia weight loss medication  Start by taking the Qsymia 3.75/23 mg dose, once daily in the morning before breakfast for the first 2  weeks  Then increase to the Qsymia 7.5/46mg  dose, once daily in the morning before breakfast  If your insurance does not cover the weight loss medicine listed above, check on the insurance coverage for the following medications:  Saxenda   Contrave  Consider weighing yourself daily to keep track of your weight  Follow up in 4-6 weeks on this medication

## 2017-01-24 ENCOUNTER — Other Ambulatory Visit: Payer: Self-pay | Admitting: Medical

## 2017-01-24 DIAGNOSIS — R110 Nausea: Secondary | ICD-10-CM

## 2017-01-25 NOTE — Telephone Encounter (Signed)
yes

## 2017-01-25 NOTE — Telephone Encounter (Signed)
Can pt have a refill on this 

## 2017-02-10 ENCOUNTER — Other Ambulatory Visit: Payer: Self-pay | Admitting: Medical

## 2017-02-10 DIAGNOSIS — R110 Nausea: Secondary | ICD-10-CM

## 2017-02-10 DIAGNOSIS — K2970 Gastritis, unspecified, without bleeding: Secondary | ICD-10-CM

## 2017-02-12 DIAGNOSIS — M5416 Radiculopathy, lumbar region: Secondary | ICD-10-CM | POA: Diagnosis not present

## 2017-03-18 ENCOUNTER — Other Ambulatory Visit: Payer: Self-pay | Admitting: Medical

## 2017-03-29 ENCOUNTER — Telehealth: Payer: Self-pay | Admitting: Medical

## 2017-03-29 ENCOUNTER — Other Ambulatory Visit: Payer: Self-pay | Admitting: Medical

## 2017-03-29 DIAGNOSIS — R110 Nausea: Secondary | ICD-10-CM

## 2017-03-29 DIAGNOSIS — K2970 Gastritis, unspecified, without bleeding: Secondary | ICD-10-CM

## 2017-03-29 MED ORDER — DEXLANSOPRAZOLE 60 MG PO CPDR: 1.0000 | DELAYED_RELEASE_CAPSULE | Freq: Every day | ORAL | 1 refills | Status: DC

## 2017-03-29 NOTE — Telephone Encounter (Signed)
I sent a refill on Dexilant.  She will have to use over-the-counter MiraLAX, as there really is no other similar option

## 2017-03-29 NOTE — Telephone Encounter (Signed)
Pt called for refills of Dexilant she would like a 90 day refill sent to [facility]. Pt also states that her insurance will no longer pay for the miralax powder. She needs to know what you would like her to replace that with. Pt can be reached at [phone].

## 2017-03-30 NOTE — Telephone Encounter (Signed)
PT called and informed

## 2017-04-25 ENCOUNTER — Ambulatory Visit: Payer: BLUE CROSS/BLUE SHIELD | Admitting: Medical

## 2017-05-07 ENCOUNTER — Other Ambulatory Visit: Payer: Self-pay | Admitting: Neurology

## 2017-05-07 ENCOUNTER — Other Ambulatory Visit: Payer: Self-pay | Admitting: Medical

## 2017-05-08 ENCOUNTER — Other Ambulatory Visit: Payer: Self-pay

## 2017-05-08 ENCOUNTER — Telehealth: Payer: Self-pay

## 2017-05-08 ENCOUNTER — Other Ambulatory Visit: Payer: Self-pay | Admitting: Medical

## 2017-05-08 MED ORDER — LOSARTAN POTASSIUM-HCTZ 50-12.5 MG PO TABS: 1.0000 | ORAL_TABLET | Freq: Every day | ORAL | 0 refills | Status: DC

## 2017-05-08 MED ORDER — LOSARTAN POTASSIUM-HCTZ 50-12.5 MG PO TABS: 1.0000 | ORAL_TABLET | Freq: Every day | ORAL | 3 refills | Status: DC

## 2017-05-08 NOTE — Telephone Encounter (Signed)
Pt called and said that she can not get her laosratan Hctz fill due to [facility] being out of stock. Pt would like a hard copy printed to take to a local pharmacy. Thanks [employer]

## 2017-05-08 NOTE — Telephone Encounter (Signed)
rx printed

## 2017-05-09 NOTE — Telephone Encounter (Signed)
Called and left message that rx was ready to be picked up

## 2017-06-10 ENCOUNTER — Other Ambulatory Visit: Payer: Self-pay | Admitting: Medical

## 2017-06-11 NOTE — Telephone Encounter (Signed)
Can pt have refill on meds  

## 2017-06-14 ENCOUNTER — Other Ambulatory Visit: Payer: Self-pay | Admitting: Medical

## 2017-06-14 DIAGNOSIS — Z139 Encounter for screening, unspecified: Secondary | ICD-10-CM

## 2017-06-15 ENCOUNTER — Ambulatory Visit
Admission: RE | Admit: 2017-06-15 | Discharge: 2017-06-15 | Disposition: A | Discharge status: Home / Self Care | Payer: BLUE CROSS/BLUE SHIELD | Source: Ambulatory Visit | Attending: Medical | Admitting: Medical

## 2017-06-15 DIAGNOSIS — Z1231 Encounter for screening mammogram for malignant neoplasm of breast: Secondary | ICD-10-CM | POA: Diagnosis not present

## 2017-06-15 DIAGNOSIS — Z139 Encounter for screening, unspecified: Secondary | ICD-10-CM

## 2017-06-26 ENCOUNTER — Other Ambulatory Visit: Payer: Self-pay | Admitting: Medical

## 2017-06-26 ENCOUNTER — Encounter: Payer: Self-pay | Admitting: Medical

## 2017-06-26 ENCOUNTER — Telehealth: Payer: Self-pay | Admitting: Medical

## 2017-06-26 NOTE — Telephone Encounter (Signed)
Pt called requesting an updated letter identical to the one written on 04/20/16 statin that pt should be allowed to use the closest restroom at work for medical reasons. Per pt her job is requesting the same letter with an updated date. Is this ok? Call pt when ready for pick up.

## 2017-06-26 NOTE — Progress Notes (Signed)
error 

## 2017-06-26 NOTE — Telephone Encounter (Signed)
Mail letter. 

## 2017-08-13 ENCOUNTER — Encounter: Payer: Self-pay | Admitting: Medical

## 2017-08-13 ENCOUNTER — Ambulatory Visit (INDEPENDENT_AMBULATORY_CARE_PROVIDER_SITE_OTHER): Payer: BLUE CROSS/BLUE SHIELD | Admitting: Medical

## 2017-08-13 ENCOUNTER — Other Ambulatory Visit (HOSPITAL_COMMUNITY)
Admission: RE | Admit: 2017-08-13 | Discharge: 2017-08-13 | Disposition: A | Discharge status: Home / Self Care | Payer: BLUE CROSS/BLUE SHIELD | Source: Ambulatory Visit | Attending: Family Medicine | Admitting: Family Medicine

## 2017-08-13 VITALS — BP 134/86 | HR 64 | Temp 97.7°F | Ht 68.0 in | Wt 212.8 lb

## 2017-08-13 DIAGNOSIS — Z0000 Encounter for general adult medical examination without abnormal findings: Secondary | ICD-10-CM | POA: Diagnosis not present

## 2017-08-13 DIAGNOSIS — M2560 Stiffness of unspecified joint, not elsewhere classified: Secondary | ICD-10-CM | POA: Diagnosis not present

## 2017-08-13 DIAGNOSIS — R7301 Impaired fasting glucose: Secondary | ICD-10-CM

## 2017-08-13 DIAGNOSIS — Z124 Encounter for screening for malignant neoplasm of cervix: Secondary | ICD-10-CM

## 2017-08-13 DIAGNOSIS — Z1231 Encounter for screening mammogram for malignant neoplasm of breast: Secondary | ICD-10-CM

## 2017-08-13 DIAGNOSIS — Z7185 Encounter for immunization safety counseling: Secondary | ICD-10-CM | POA: Insufficient documentation

## 2017-08-13 DIAGNOSIS — R110 Nausea: Secondary | ICD-10-CM

## 2017-08-13 DIAGNOSIS — E669 Obesity, unspecified: Secondary | ICD-10-CM | POA: Diagnosis not present

## 2017-08-13 DIAGNOSIS — R109 Unspecified abdominal pain: Secondary | ICD-10-CM | POA: Diagnosis not present

## 2017-08-13 DIAGNOSIS — Z8673 Personal history of transient ischemic attack (TIA), and cerebral infarction without residual deficits: Secondary | ICD-10-CM | POA: Diagnosis not present

## 2017-08-13 DIAGNOSIS — E559 Vitamin D deficiency, unspecified: Secondary | ICD-10-CM | POA: Diagnosis not present

## 2017-08-13 DIAGNOSIS — J309 Allergic rhinitis, unspecified: Secondary | ICD-10-CM

## 2017-08-13 DIAGNOSIS — E785 Hyperlipidemia, unspecified: Secondary | ICD-10-CM

## 2017-08-13 DIAGNOSIS — Z7189 Other specified counseling: Secondary | ICD-10-CM

## 2017-08-13 DIAGNOSIS — I10 Essential (primary) hypertension: Secondary | ICD-10-CM | POA: Diagnosis not present

## 2017-08-13 DIAGNOSIS — G8929 Other chronic pain: Secondary | ICD-10-CM

## 2017-08-13 DIAGNOSIS — Z8509 Personal history of malignant neoplasm of other digestive organs: Secondary | ICD-10-CM | POA: Diagnosis not present

## 2017-08-13 DIAGNOSIS — M5416 Radiculopathy, lumbar region: Secondary | ICD-10-CM

## 2017-08-13 DIAGNOSIS — K219 Gastro-esophageal reflux disease without esophagitis: Secondary | ICD-10-CM | POA: Diagnosis not present

## 2017-08-13 DIAGNOSIS — R809 Proteinuria, unspecified: Secondary | ICD-10-CM

## 2017-08-13 DIAGNOSIS — E2839 Other primary ovarian failure: Secondary | ICD-10-CM

## 2017-08-13 DIAGNOSIS — M2550 Pain in unspecified joint: Secondary | ICD-10-CM | POA: Insufficient documentation

## 2017-08-13 DIAGNOSIS — K5900 Constipation, unspecified: Secondary | ICD-10-CM

## 2017-08-13 DIAGNOSIS — Z1239 Encounter for other screening for malignant neoplasm of breast: Secondary | ICD-10-CM

## 2017-08-13 LAB — POCT URINALYSIS DIP (PROADVANTAGE DEVICE)
Bilirubin, UA: NEGATIVE
Blood, UA: NEGATIVE
Glucose, UA: NEGATIVE mg/dL
Ketones, POC UA: NEGATIVE mg/dL
Leukocytes, UA: NEGATIVE
Nitrite, UA: NEGATIVE
Protein Ur, POC: NEGATIVE mg/dL
pH, UA: 6 (ref 5.0–8.0)

## 2017-08-13 NOTE — Progress Notes (Signed)
Subjective:   HPI  Lisa Briggs is a 57 y.o. female who presents for a complete physical.  Medical care team includes:  Dr. [name], [facility]  Dr. Owens Loffler, GI  Podiatry.  Onyx Schirmer, Camelia Eng, PA-C here for primary care Dr. Einar Gip, eye doctor Sees dentist Neurology, Dr. Narda Amber  Concerns: Pap 04/20/16 reviewed 06/2016 saw Dr. Benjie Karvonen, s/p endometrial biopsy with no worrisome findings.  Mammogram 06/15/17 reviewed Colonoscopy 01/23/14 Dr. Kara Dies eye doctor in 05/2017, sees dentist this month  Working full time. Exercising - daily stretching, some walking.  Been sore in left lower leg just beyond knee and some pains in left hip/left low back.  Has morning stiffness, last for long time, but can feet stiff throughout the day or if she has been sitting for a while. She thinks she gets some swelling in hands, but also notes swelling in left hip/left side.   She endorsed pain in hands and wrists.  Wants testing for RA.  Says there is family hx/o RA.  Reviewed their medical, surgical, family, social, medication, and allergy history and updated chart as appropriate.  Past Medical History:  Diagnosis Date  . Allergy   . Anemia    iron therapy for years as of 10/12; normal Hgb 08/2013  . Arthritis   . Chest pain 04/05/2011   cardiac eval, normal treadmill stress test, Dr. Tollie Eth  . Chronic back pain   . Constipation   . Farsightedness    wears glasses, Eye care center  . 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 [name], [facility]  . GERD (gastroesophageal reflux disease)   . History of uterine fibroid   . Hyperlipidemia   . Hypertension   . Paresthesia 09/2014   initially thought to be TIA, neurology consult in 12/2014 with other non TIA considerations.    . Polyarthralgia    normal rheumatoid screen 01/2012    Past Surgical History:  Procedure Laterality Date  . COLONOSCOPY  01/2014    diverticulosis, othwerise normal - Dr. Owens Loffler  . ESOPHAGOGASTRODUODENOSCOPY  2013   Dr. Benson Norway, gastritis  . ESOPHAGOGASTRODUODENOSCOPY  [id number]  . EUS N/A 04/16/2014   Procedure: UPPER ENDOSCOPIC ULTRASOUND (EUS) LINEAR;  Surgeon: Milus Banister, MD;  Location: [facility];  Service: Endoscopy;  Laterality: N/A;  . gall stone surgery    . KNEE ARTHROSCOPY Left   . LAPAROSCOPIC GASTRIC RESECTION N/A 06/09/2014   Procedure: LAPAROSCOPIC GASTRIC MASS RESECTION;  Surgeon: [name], MD;  Location: [facility]S;  Service: General;  Laterality: N/A;  . LIPOMA EXCISION     forehead  . UTERINE FIBROID SURGERY      Social History   Socioeconomic History  . Marital status: Married    Spouse name: Not on file  . Number of children: 1  . Years of education: Not on file  . Highest education level: Not on file  Occupational History  . Occupation: IT sales professional: [employer]  Social Needs  . Financial resource strain: Not on file  . Food insecurity:    Worry: Not on file    Inability: Not on file  . Transportation needs:    Medical: Not on file    Non-medical: Not on file  Tobacco Use  . Smoking status: Never Smoker  . Smokeless tobacco: Never Used  Substance and Sexual Activity  . Alcohol use: No    Alcohol/week: 0.0 oz  . Drug use: No  .  Sexual activity: Not on file  Lifestyle  . Physical activity:    Days per week: Not on file    Minutes per session: Not on file  . Stress: Not on file  Relationships  . Social connections:    Talks on phone: Not on file    Gets together: Not on file    Attends religious service: Not on file    Active member of club or organization: Not on file    Attends meetings of clubs or organizations: Not on file    Relationship status: Not on file  . Intimate partner violence:    Fear of current or ex partner: Not on file    Emotionally abused: Not on file    Physically abused: Not on file    Forced sexual activity: Not on file   Other Topics Concern  . Not on file  Social History Narrative   Married, has 1 son in [place] and some grandchildren.  Glass blower/designer.  Active on job.  Does stretching and exercises daily as per physical therapy.  Works 12 hours daily.      Family History  Problem Relation Age of Onset  . Hypertension Mother   . Arthritis Mother   . GER disease Mother   . Glaucoma Mother   . Stroke Father   . Alcohol abuse Father   . Breast cancer Sister        breast cancer dx late 8s; another sister with leukemia  . Hypertension Sister   . Heart disease Sister        heart murmur; other sisters with heart problems  . Diabetes Brother   . Colon cancer Brother 72  . Diabetes Maternal Aunt   . Heart disease Maternal Aunt   . Heart disease Maternal Grandmother   . Heart disease Maternal Grandfather   . Heart disease Maternal Uncle   . Stroke Maternal Uncle   . Colon polyps Neg Hx      Current Outpatient Medications:  .  aspirin EC 81 MG tablet, Take 1 tablet (81 mg total) by mouth daily., Disp: 90 tablet, Rfl: 3 .  dexlansoprazole (DEXILANT) 60 MG capsule, Take 1 capsule (60 mg total) by mouth daily., Disp: 90 capsule, Rfl: 1 .  EQ ALLERGY RELIEF 10 MG tablet, TAKE ONE TABLET BY MOUTH ONCE DAILY, Disp: 30 tablet, Rfl: 11 .  famotidine (PEPCID) 20 MG tablet, TAKE ONE TABLET BY MOUTH AT BEDTIME, Disp: 90 tablet, Rfl: 3 .  fluticasone (FLONASE) 50 MCG/ACT nasal spray, USE ONE SPRAY(S) IN EACH NOSTRIL DAILY AS NEEDED FOR ALLERGIES OR RHINITIS., Disp: 16 g, Rfl: 11 .  gabapentin (NEURONTIN) 300 MG capsule, TAKE ONE TABLET BY MOUTH IN THE AFTERNOON AND 2 AT BEDTIME, Disp: 90 capsule, Rfl: 5 .  hydrocortisone (PROCTOCORT) 1 % CREA, Apply 1 application topically daily., Disp: 45 g, Rfl: 1 .  losartan-hydrochlorothiazide (HYZAAR) 50-12.5 MG tablet, Take 1 tablet by mouth daily., Disp: 90 tablet, Rfl: 0 .  ondansetron (ZOFRAN) 4 MG tablet, TAKE ONE TABLET BY MOUTH EVERY 8 HOURS AS NEEDED FOR  NAUSEA   OR  VOMITING, Disp: 20 tablet, Rfl: 2 .  polyethylene glycol powder (GLYCOLAX/MIRALAX) powder, TAKE ONE CAPFUL (17 GRAMS) BY MOUTH DAILY., Disp: 850 g, Rfl: 3 .  potassium chloride (K-DUR,KLOR-CON) 10 MEQ tablet, TAKE ONE TABLET BY MOUTH ONCE DAILY, Disp: 90 tablet, Rfl: 3 .  simvastatin (ZOCOR) 20 MG tablet, TAKE ONE TABLET BY MOUTH ONCE DAILY, Disp: 90 tablet, Rfl: 3 .  Vitamin D, Ergocalciferol, (DRISDOL) 50000 units CAPS capsule, TAKE 1 CAPSULE BY MOUTH EVERY 7 DAYS, Disp: 12 capsule, Rfl: 0 .  cyclobenzaprine (FLEXERIL) 10 MG tablet, Take 1 tablet (10 mg total) by mouth 2 (two) times daily as needed for muscle spasms. (Patient not taking: Reported on 08/13/2017), Disp: 20 tablet, Rfl: 0 .  venlafaxine (EFFEXOR) 37.5 MG tablet, Take 1 tablet by mouth daily., Disp: , Rfl:   Allergies  Allergen Reactions  . Kiwi Extract Hives and Itching  . Mucinex Dm [Dm-Guaifenesin Er] Nausea Only    Messes up her stomach. Pt report on 01/21/15  . Peach [Prunus Persica] Itching    Peach peeling makes pt itch    Review of Systems Constitutional: -fever, -chills, -sweats, -unexpected weight change, -decreased appetite, -fatigue Allergy: -sneezing, -itching, -congestion Dermatology: -changing moles, --rash, -lumps ENT: -runny nose, -ear pain, -sore throat, -hoarseness, -sinus pain, -teeth pain, - ringing in ears, -hearing loss, -nosebleeds Cardiology: -chest pain, -palpitations, -swelling, -difficulty breathing when lying flat, -waking up short of breath Respiratory: -cough, -shortness of breath, -difficulty breathing with exercise or exertion, -wheezing, -coughing up blood Gastroenterology: -abdominal pain, -nausea, -vomiting, -diarrhea, -constipation, -blood in stool, -changes in bowel movement, -difficulty swallowing or eating Hematology: -bleeding, -bruising  Musculoskeletal: -joint aches, -muscle aches, -joint swelling, -back pain, -neck pain, -cramping, -changes in gait Ophthalmology: denies vision  changes, eye redness, itching, discharge Urology: -burning with urination, -difficulty urinating, -blood in urine, -urinary frequency, -urgency, -incontinence Neurology: -headache, -weakness, -tingling, -numbness, -memory loss, -falls, -dizziness Psychology: -depressed mood, -agitation, -sleep problems     Objective:   Physical Exam  BP 134/86   Pulse 64   Temp 97.7 F (36.5 C) (Oral)   Ht 5\' 8"  (1.727 m)   Wt 212 lb 12.8 oz (96.5 kg)   LMP 02/23/2015   SpO2 99%   BMI 32.36 kg/m   General appearance: alert, no distress, WD/WN, AA female Skin: right lower abdomen, inferior to umbilicus with 84mm x 58mm brown flat macules, somewhat triangular shaped, but well defined, uniform color. Few other scattered benign appearing macules. No other worrisome lesions HEENT: normocephalic, conjunctiva/corneas normal, sclerae anicteric, PERRLA, EOMi, nares patent, no discharge or erythema, pharynx normal Oral cavity: MMM, tongue normal, teeth in good repair Neck: supple, no lymphadenopathy, no thyromegaly, no masses, normal ROM, no bruits Chest: non tender, normal shape and expansion Heart: RRR, normal S1, S2, no murmurs Lungs: CTA bilaterally, no wheezes, rhonchi, or rales Abdomen: +bs, soft, surgical port scars of right upper and right mid abdomen, umbilicus, mild lower abdominal tenderness, otherwise non tender, non distended, no masses, no hepatomegaly, no splenomegaly, no bruits Back: back non tender, normal ROM, no scoliosis Musculoskeletal: mild tenderness of left lower back paraspinal, mild tenderness left anterior lower leg just distal to knee, but no bruising or deformity, mild bony arthritis changes of 3rd finger PIPs, otherwise no obvious swelling deformity or bony changes of other fingers, extremities  non tender, normal ROM,no edema, no cyanosis, no clubbing Pulses: 2+ symmetric, upper and lower extremities, normal cap refill Neurological: alert, oriented x 3, CN2-12 intact, strength  normal upper extremities and lower extremities, sensation normal throughout, DTRs 2+ throughout, no cerebellar signs, gait normal Psychiatric: normal affect, behavior normal, pleasant  Breast: nontender, no masses or lumps, no skin changes, no nipple discharge or inversion, no axillary lymphadenopathy Gyn: Normal external genitalia without lesions, vagina with normal mucosa, cervix without lesions, no cervical motion tenderness, no abnormal vaginal discharge. Uterus with tenderness and seems enlarged, otherwise  adnexa not enlarged, no masses.  chaperoned by nurse. Rectal: deferred    Assessment and Plan :    Encounter Diagnoses  Name Primary?  . Encounter for health maintenance examination in adult Yes  . Essential hypertension   . Allergic rhinitis, unspecified seasonality, unspecified trigger   . Gastroesophageal reflux disease without esophagitis   . Impaired fasting blood sugar   . Class 1 obesity with serious comorbidity in adult, unspecified BMI, unspecified obesity type   . Chronic nausea   . Chronic abdominal pain   . Hyperlipidemia, unspecified hyperlipidemia type   . Vitamin D deficiency   . History of TIA (transient ischemic attack)   . History of gastrointestinal stromal tumor (GIST)   . Chronic radicular lumbar pain   . Estrogen deficiency   . Screening for breast cancer   . Constipation, unspecified constipation type   . Proteinuria, unspecified type   . Joint stiffness   . Polyarthralgia   . Vaccine counseling   . Screening for cervical cancer    Physical exam - discussed healthy lifestyle, diet, exercise, preventative care, vaccinations, and addressed their concerns.  See your eye doctor yearly for routine vision care. See your dentist yearly for routine dental care including hygiene visits twice yearly. Routine labs today advised healthy diet, regular exercise  C/t current medications  Up to date on cancer screening  rheumatoid screening today per her  concerns  Follow-up pending labs   Martasia was seen today for annual exam.  Diagnoses and all orders for this visit:  Encounter for health maintenance examination in adult -     Uric acid -     Rheumatoid factor -     Sedimentation rate -     CYCLIC CITRUL PEPTIDE ANTIBODY, IGG/IGA -     Hemoglobin A1c -     Lipid panel  Essential hypertension  Allergic rhinitis, unspecified seasonality, unspecified trigger  Gastroesophageal reflux disease without esophagitis  Impaired fasting blood sugar  Class 1 obesity with serious comorbidity in adult, unspecified BMI, unspecified obesity type -     Hemoglobin A1c  Chronic nausea  Chronic abdominal pain  Hyperlipidemia, unspecified hyperlipidemia type -     Lipid panel  Vitamin D deficiency  History of TIA (transient ischemic attack)  History of gastrointestinal stromal tumor (GIST)  Chronic radicular lumbar pain  Estrogen deficiency  Screening for breast cancer  Constipation, unspecified constipation type  Proteinuria, unspecified type  Joint stiffness -     Uric acid -     Rheumatoid factor -     Sedimentation rate -     CYCLIC CITRUL PEPTIDE ANTIBODY, IGG/IGA  Polyarthralgia -     Uric acid -     Rheumatoid factor -     Sedimentation rate -     CYCLIC CITRUL PEPTIDE ANTIBODY, IGG/IGA  Vaccine counseling  Screening for cervical cancer -     Cytology - PAP

## 2017-08-14 LAB — LIPID PANEL
Chol/HDL Ratio: 2 ratio (ref 0.0–4.4)
Cholesterol, Total: 167 mg/dL (ref 100–199)
HDL: 84 mg/dL (ref >39)
LDL Calculated: 75 mg/dL (ref 0–99)
Triglycerides: 42 mg/dL (ref 0–149)
VLDL Cholesterol Cal: 8 mg/dL (ref 5–40)

## 2017-08-14 LAB — HEMOGLOBIN A1C
Est. average glucose Bld gHb Est-mCnc: 120 mg/dL
Hgb A1c MFr Bld: 5.8 % — ABNORMAL HIGH (ref 4.8–5.6)

## 2017-08-14 LAB — SEDIMENTATION RATE: Sed Rate: 17 mm/hr (ref 0–40)

## 2017-08-14 LAB — URIC ACID: Uric Acid: 3.4 mg/dL (ref 2.5–7.1)

## 2017-08-14 LAB — CYCLIC CITRUL PEPTIDE ANTIBODY, IGG/IGA: Cyclic Citrullin Peptide Ab: 23 units — ABNORMAL HIGH (ref 0–19)

## 2017-08-14 LAB — RHEUMATOID FACTOR: Rhuematoid fact SerPl-aCnc: 31.8 IU/mL — ABNORMAL HIGH (ref 0.0–13.9)

## 2017-08-15 LAB — CYTOLOGY - PAP: Diagnosis: NEGATIVE

## 2017-08-17 ENCOUNTER — Telehealth: Payer: Self-pay | Admitting: Medical

## 2017-08-17 NOTE — Telephone Encounter (Signed)
Spoke to [employer] at pt'sjob at [employer] [phone]) who stated that she will be emailing or faxing copies of pt's FMLA forms that we sent to their office last year. Per pt there was 2 different FMLA forms sent to her job last year.

## 2017-08-19 ENCOUNTER — Other Ambulatory Visit: Payer: Self-pay | Admitting: Medical

## 2017-08-19 DIAGNOSIS — K2950 Unspecified chronic gastritis without bleeding: Secondary | ICD-10-CM

## 2017-08-19 DIAGNOSIS — R110 Nausea: Secondary | ICD-10-CM

## 2017-08-19 MED ORDER — LOSARTAN POTASSIUM-HCTZ 50-12.5 MG PO TABS: 1.0000 | ORAL_TABLET | Freq: Every day | ORAL | 3 refills | Status: DC

## 2017-08-19 MED ORDER — VENLAFAXINE HCL 37.5 MG PO TABS: 37.5000 mg | ORAL_TABLET | Freq: Every day | ORAL | 1 refills | Status: DC

## 2017-08-19 MED ORDER — GABAPENTIN 300 MG PO CAPS: ORAL_CAPSULE | ORAL | 3 refills | Status: DC

## 2017-08-19 MED ORDER — VITAMIN D 1000 UNITS PO TABS: 1000.0000 [IU] | ORAL_TABLET | Freq: Every day | ORAL | 3 refills | Status: DC

## 2017-08-19 MED ORDER — FAMOTIDINE 20 MG PO TABS: 20.0000 mg | ORAL_TABLET | Freq: Every day | ORAL | 3 refills | Status: DC

## 2017-08-19 MED ORDER — ASPIRIN EC 81 MG PO TBEC: 81.0000 mg | DELAYED_RELEASE_TABLET | Freq: Every day | ORAL | 3 refills | Status: DC

## 2017-08-19 MED ORDER — DEXLANSOPRAZOLE 60 MG PO CPDR: 1.0000 | DELAYED_RELEASE_CAPSULE | Freq: Every day | ORAL | 3 refills | Status: DC

## 2017-08-19 MED ORDER — POTASSIUM CHLORIDE CRYS ER 10 MEQ PO TBCR: 10.0000 meq | EXTENDED_RELEASE_TABLET | Freq: Every day | ORAL | 3 refills | Status: DC

## 2017-08-19 MED ORDER — SIMVASTATIN 20 MG PO TABS: 20.0000 mg | ORAL_TABLET | Freq: Every day | ORAL | 3 refills | Status: DC

## 2017-08-20 NOTE — Telephone Encounter (Signed)
Called pt and left message advising that we have a copy of our FMLA form for June 2018 ready for her to pick up and per the info from her employer, [employer] completed the updated FMLA in July 2018 that the pt also wanting a copy of

## 2017-08-24 ENCOUNTER — Telehealth: Payer: Self-pay | Admitting: Medical

## 2017-08-24 NOTE — Telephone Encounter (Signed)
Pt would like a refill on her Flexeril

## 2017-08-24 NOTE — Telephone Encounter (Signed)
Pt dropped off FMLA papers for renewal (papers in your folder)

## 2017-08-29 ENCOUNTER — Other Ambulatory Visit: Payer: Self-pay | Admitting: Medical

## 2017-08-29 MED ORDER — CYCLOBENZAPRINE HCL 10 MG PO TABS: 10.0000 mg | ORAL_TABLET | Freq: Two times a day (BID) | ORAL | 0 refills | Status: DC | PRN

## 2017-08-29 NOTE — Telephone Encounter (Signed)
Forms ready.  Charge the customary fee

## 2017-08-30 NOTE — Telephone Encounter (Signed)
done

## 2017-08-30 NOTE — Telephone Encounter (Signed)
Left message for pt

## 2017-09-17 NOTE — Progress Notes (Deleted)
Office Visit Note  [name]             Date of Birth: Nov 09, 1960           MRN: [id number]             PCP: Carlena Hurl, PA-C Referring: Carlena Hurl, PA-C Visit Date: 09/25/2017 Occupation: @GUAROCC @    Subjective:  No chief complaint on file.   History of Present Illness: Lisa Briggs is a 57 y.o. female ***   Activities of Daily Living:  Patient reports morning stiffness for *** {minute/hour:19697}.   Patient {ACTIONS;DENIES/REPORTS:21021675::"Denies"} nocturnal pain.  Difficulty dressing/grooming: {ACTIONS;DENIES/REPORTS:21021675::"Denies"} Difficulty climbing stairs: {ACTIONS;DENIES/REPORTS:21021675::"Denies"} Difficulty getting out of chair: {ACTIONS;DENIES/REPORTS:21021675::"Denies"} Difficulty using hands for taps, buttons, cutlery, and/or writing: {ACTIONS;DENIES/REPORTS:21021675::"Denies"}   No Rheumatology ROS completed.   PMFS History:  Patient Active Problem List   Diagnosis Date Noted  . Vaccine counseling 08/13/2017  . Polyarthralgia 08/13/2017  . Joint stiffness 08/13/2017  . Sensitive skin 01/23/2017  . Need for influenza vaccination 01/23/2017  . Proteinuria 01/23/2017  . History of gastrointestinal stromal tumor (GIST) 04/20/2016  . History of TIA (transient ischemic attack) 04/20/2016  . Screening for breast cancer 04/20/2016  . Estrogen deficiency 04/20/2016  . Chronic maxillary sinusitis 04/20/2016  . Impaired fasting blood sugar 04/20/2016  . Constipation 04/20/2016  . History of fall 04/20/2016  . Chronic radicular lumbar pain 01/27/2016  . Encounter for health maintenance examination in adult 03/22/2015  . Facial paresthesia 03/22/2015  . Cognitive decline 03/22/2015  . Screening for cervical cancer 03/22/2015  . Vitamin D deficiency 03/22/2015  . History of uterine leiomyoma 03/22/2015  . Gastroesophageal reflux disease without esophagitis 03/02/2014  . Chronic nausea 03/02/2014  . Chronic abdominal pain  03/02/2014  . Rhinitis, allergic 03/02/2014  . Essential hypertension 03/02/2014  . Hyperlipidemia 03/02/2014  . Class 1 obesity with serious comorbidity in adult 01/16/2012    Past Medical History:  Diagnosis Date  . Allergy   . Anemia    iron therapy for years as of 10/12; normal Hgb 08/2013  . Arthritis   . Chest pain 04/05/2011   cardiac eval, normal treadmill stress test, Dr. Tollie Eth  . Chronic back pain   . Constipation   . Farsightedness    wears glasses, Eye care center  . 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 [name], [facility]  . GERD (gastroesophageal reflux disease)   . History of uterine fibroid   . Hyperlipidemia   . Hypertension   . Paresthesia 09/2014   initially thought to be TIA, neurology consult in 12/2014 with other non TIA considerations.    . Polyarthralgia    normal rheumatoid screen 01/2012    Family History  Problem Relation Age of Onset  . Hypertension Mother   . Arthritis Mother   . GER disease Mother   . Glaucoma Mother   . Stroke Father   . Alcohol abuse Father   . Breast cancer Sister        breast cancer dx late 5s; another sister with leukemia  . Hypertension Sister   . Heart disease Sister        heart murmur; other sisters with heart problems  . Diabetes Brother   . Colon cancer Brother 87  . Diabetes Maternal Aunt   . Heart disease Maternal Aunt   . Heart disease Maternal Grandmother   . Heart disease Maternal Grandfather   . Heart disease Maternal Uncle   .  Stroke Maternal Uncle   . Colon polyps Neg Hx    Past Surgical History:  Procedure Laterality Date  . COLONOSCOPY  01/2014   diverticulosis, othwerise normal - Dr. Owens Loffler  . ESOPHAGOGASTRODUODENOSCOPY  2013   Dr. Benson Norway, gastritis  . ESOPHAGOGASTRODUODENOSCOPY  [id number]  . EUS N/A 04/16/2014   Procedure: UPPER ENDOSCOPIC ULTRASOUND (EUS) LINEAR;  Surgeon: Milus Banister, MD;  [facility];  Service: Endoscopy;   Laterality: N/A;  . gall stone surgery    . KNEE ARTHROSCOPY Left   . LAPAROSCOPIC GASTRIC RESECTION N/A 06/09/2014   Procedure: LAPAROSCOPIC GASTRIC MASS RESECTION;  Surgeon: [name], MD;  Location: [facility];  Service: General;  Laterality: N/A;  . LIPOMA EXCISION     forehead  . UTERINE FIBROID SURGERY     Social History   Social History Narrative   Married, has 1 son in [place] and some grandchildren.  Glass blower/designer.  Active on job.  Does stretching and exercises daily as per physical therapy.  Works 12 hours daily.       Objective: Vital Signs: LMP 02/23/2015    Physical Exam   Musculoskeletal Exam: ***  CDAI Exam: No CDAI exam completed.    Investigation: Findings:  08/13/17: Uric acid 3.4, RF 31.8, sed rate 17, CCP 23, Lipid panel WNL, hgbA1c 5.8 09/20/16: TSH 0.56   Component     Latest Ref Rng & Units 08/13/2017  Cholesterol, Total     100 - 199 mg/dL 167  Triglycerides     0 - 149 mg/dL 42  HDL Cholesterol     >39 mg/dL 84  VLDL Cholesterol Cal     5 - 40 mg/dL 8  LDL (calc)     0 - 99 mg/dL 75  Total CHOL/HDL Ratio     0.0 - 4.4 ratio 2.0  Hemoglobin A1C     4.8 - 5.6 % 5.8 (H)  Est. average glucose Bld gHb Est-mCnc     mg/dL 120  Uric Acid     2.5 - 7.1 mg/dL 3.4  RA Latex Turbid.     0.0 - 13.9 IU/mL 31.8 (H)  Sed Rate     0 - 40 mm/hr 17  Cyclic Citrullin Peptide Ab     0 - 19 units 23 (H)   CBC Latest Ref Rng & Units 09/12/2016 04/20/2016 09/27/2015  WBC 4.0 - 10.5 K/uL 5.3 6.5 8.3  Hemoglobin 11.7 - 15.5 g/dL 12.9 12.5 12.8  Hematocrit 35.0 - 45.0 % 42.3 39.9 40.8  Platelets 140 - 400 K/uL 375 360 353   CMP Latest Ref Rng & Units 09/12/2016 04/20/2016 09/27/2015  Glucose 65 - 99 mg/dL 91 86 90  BUN 7 - 25 mg/dL 13 16 13   Creatinine 0.50 - 1.05 mg/dL 0.77 0.63 0.87  Sodium 135 - 146 mmol/L 141 141 140  Potassium 3.5 - 5.3 mmol/L 4.6 4.4 4.8  Chloride 98 - 110 mmol/L 105 106 109  CO2 20 - 31 mmol/L 29 30 22   Calcium 8.6 - 10.4 mg/dL  9.6 9.2 9.7  Total Protein 6.1 - 8.1 g/dL - 6.9 7.4  Total Bilirubin 0.2 - 1.2 mg/dL - 0.5 0.7  Alkaline Phos 33 - 130 U/L - 96 107  AST 10 - 35 U/L - 15 28  ALT 6 - 29 U/L - 13 24     Imaging: No results found.  Speciality Comments: No specialty comments available.    Procedures:  No procedures performed Allergies: Kiwi extract;  Mucinex dm [dm-guaifenesin er]; and Peach [prunus persica]   Assessment / Plan:     Visit Diagnoses: Joint stiffness  Polyarthralgia  History of TIA (transient ischemic attack)  History of gastrointestinal stromal tumor (GIST)  Essential hypertension  Vitamin D deficiency  History of hyperlipidemia  History of gastroesophageal reflux (GERD)    Orders: No orders of the defined types were placed in this encounter.  No orders of the defined types were placed in this encounter.   Face-to-face time spent with patient was *** minutes. 50% of time was spent in counseling and coordination of care.  Follow-Up Instructions: No follow-ups on file.   Ofilia Neas, PA-C  Note - This record has been created using Dragon software.  Chart creation errors have been sought, but may not always  have been located. Such creation errors do not reflect on  the standard of medical care.

## 2017-09-23 ENCOUNTER — Other Ambulatory Visit: Payer: Self-pay | Admitting: Medical

## 2017-09-24 NOTE — Telephone Encounter (Signed)
Is this ok to refill?  

## 2017-09-25 ENCOUNTER — Ambulatory Visit: Payer: BLUE CROSS/BLUE SHIELD | Admitting: Rheumatology

## 2017-10-05 ENCOUNTER — Other Ambulatory Visit: Payer: Self-pay | Admitting: Medical

## 2017-10-05 ENCOUNTER — Ambulatory Visit (INDEPENDENT_AMBULATORY_CARE_PROVIDER_SITE_OTHER): Payer: BLUE CROSS/BLUE SHIELD | Admitting: Family Medicine

## 2017-10-05 ENCOUNTER — Encounter: Payer: Self-pay | Admitting: Family Medicine

## 2017-10-05 VITALS — BP 120/70 | HR 91 | Temp 98.2°F | Resp 16 | Wt 209.4 lb

## 2017-10-05 DIAGNOSIS — M542 Cervicalgia: Secondary | ICD-10-CM

## 2017-10-05 DIAGNOSIS — R0789 Other chest pain: Secondary | ICD-10-CM

## 2017-10-05 DIAGNOSIS — Z23 Encounter for immunization: Secondary | ICD-10-CM

## 2017-10-05 NOTE — Progress Notes (Signed)
   Subjective:    Patient ID: Lisa Briggs, female    DOB: September 15, 1960, 57 y.o.   MRN: [id number]  HPI Chief Complaint  Patient presents with  . side pain    side pain- off and on for a while, arm is sore, under breast to back of lower shoulder blade   She is here with complaints of 3-4 days right upper shoulder pain and right upper back. Denies injury.  Pain is intermittent. Pain is worse with movement. Pain improves with rest. She reports similar pain last month and in the past.   States she has tried ice and heat. States she normally takes motrin or excedrin for pain but has not been taking it. She is taking gabapentin and Flexeril. States she has Tramadol at home but has not tried it.   Denies fever, chills, dizziness, headache, vision changes, chest pain, palpitations, shortness of breath, cough, abdominal pain, N/V/D.  No numbness, tingling or weakness.   Reviewed allergies, medications, past medical, surgical, family, and social history.   Review of Systems Pertinent positives and negatives in the history of present illness.     Objective:   Physical Exam BP 120/70   Pulse 91   Temp 98.2 F (36.8 C) (Oral)   Resp 16   Wt 209 lb 6.4 oz (95 kg)   LMP 02/23/2015   SpO2 99%   BMI 31.84 kg/m   Alert and in no distress. Pharyngeal area is normal. Neck is supple without adenopathy or thyromegaly. Right lateral neck and right upper trapezius with TTP and pain with flexion. Right side rib cage with TTP, normal skin exam. Pain with movement. Cardiac exam shows a regular sinus rhythm without murmurs or gallops. Lungs are clear to auscultation. Extremities without edema, normal pulses. Equal hand grip strength. CN intact. Skin is warm and dry, no pallor or rash.        Assessment & Plan:  Neck pain on right side  Right-sided chest wall pain  No obvious explanation for her symptoms. She does have chronic pain. No acute infectious process.  She will continue taking her usual  pain medication and start using heat to the areas. States she has Tramadol at home.  Also recommend trying Salon Pas patches or Arnicare gel. Follow up with [name] next week if not improving.

## 2017-10-05 NOTE — Addendum Note (Signed)
Addended by: Minette Headland A on: 10/05/2017 02:24 PM   Modules accepted: Orders

## 2017-10-05 NOTE — Patient Instructions (Signed)
Continue on your current pain medication and you also try the Tramadol that you have at home prescribed by your PCP [name].  You can try adding a topical pain medication such as Salon Pas patches or Arnicare gel.   Use heat and not ice.   Follow up with Audelia Acton if your pain is not improving next week.

## 2017-10-06 ENCOUNTER — Ambulatory Visit (HOSPITAL_COMMUNITY)
Admission: EM | Admit: 2017-10-06 | Discharge: 2017-10-06 | Disposition: A | Discharge status: Home / Self Care | Payer: BLUE CROSS/BLUE SHIELD | Attending: Family Medicine | Admitting: Family Medicine

## 2017-10-06 ENCOUNTER — Ambulatory Visit (INDEPENDENT_AMBULATORY_CARE_PROVIDER_SITE_OTHER): Payer: BLUE CROSS/BLUE SHIELD

## 2017-10-06 ENCOUNTER — Encounter (HOSPITAL_COMMUNITY): Payer: Self-pay | Admitting: Emergency Medicine

## 2017-10-06 ENCOUNTER — Other Ambulatory Visit: Payer: Self-pay

## 2017-10-06 DIAGNOSIS — M549 Dorsalgia, unspecified: Secondary | ICD-10-CM

## 2017-10-06 DIAGNOSIS — J90 Pleural effusion, not elsewhere classified: Secondary | ICD-10-CM | POA: Diagnosis not present

## 2017-10-06 MED ORDER — MELOXICAM 7.5 MG PO TABS: 7.5000 mg | ORAL_TABLET | Freq: Every day | ORAL | 0 refills | Status: DC

## 2017-10-06 NOTE — Discharge Instructions (Signed)
Xray negative for fractures. As discussed, xray showed some changes in the right lower base, and will need follow up with your PCP for monitoring. Start Mobic. Do not take ibuprofen (motrin/advil)/ naproxen (aleve) while on mobic. Warm compress to the muscles. If developing fever, cough, follow up for reevaluation. If experiencing chest pain, shortness of breath, leg swelling, unable to lay down flat due to trouble breathing, go to the emergency department for further evaluation. Otherwise, follow up with PCP for further monitoring and management of xray findings.

## 2017-10-06 NOTE — ED Triage Notes (Signed)
The patient presented to the [facility] with a complaint of back pain that radiates into her right side x right rib area x 4 days. The patient denied any known injury.

## 2017-10-06 NOTE — ED Provider Notes (Signed)
[name]    CSN: [id number] Arrival date & time: 10/06/17  1302     History   Chief Complaint Chief Complaint  Patient presents with  . Back Pain    HPI Lisa Briggs is a 57 y.o. female.   57 year old female comes in with right upper back pain that has been intermittent for "awhile" and worsening acutely the past 4 days. Denies injury/trauma. States work requires strenuous activity and twisting/pulling motions. States can have radiation of pain down the right arm intermittently. Denies neck pain, numbness/tingling of the fingers. Denies decreased dexterity. Patient is worried about gallstones. Denies abdominal pain, nausea,vomiting. Has been eating and drinking without problems. States pain is worse with movement. Sitting in a stabilized position makes it better. Denies URI symptoms such as cough, congestion, sore throat. Denies chest pain, shortness of breath, wheezing. Denies fever, chills, night sweats. Ibuprofen 400mg  Q6H without relief.      Past Medical History:  Diagnosis Date  . Allergy   . Anemia    iron therapy for years as of 10/12; normal Hgb 08/2013  . Arthritis   . Chest pain 04/05/2011   cardiac eval, normal treadmill stress test, Dr. Tollie Eth  . Chronic back pain   . Constipation   . Farsightedness    wears glasses, [facility] care center  . 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 [name], [facility]  . GERD (gastroesophageal reflux disease)   . History of uterine fibroid   . Hyperlipidemia   . Hypertension   . Paresthesia 09/2014   initially thought to be TIA, neurology consult in 12/2014 with other non TIA considerations.    . Polyarthralgia    normal rheumatoid screen 01/2012    Patient Active Problem List   Diagnosis Date Noted  . Vaccine counseling 08/13/2017  . Polyarthralgia 08/13/2017  . Joint stiffness 08/13/2017  . Sensitive skin 01/23/2017  . Need for influenza vaccination 01/23/2017  .  Proteinuria 01/23/2017  . History of gastrointestinal stromal tumor (GIST) 04/20/2016  . History of TIA (transient ischemic attack) 04/20/2016  . Screening for breast cancer 04/20/2016  . Estrogen deficiency 04/20/2016  . Chronic maxillary sinusitis 04/20/2016  . Impaired fasting blood sugar 04/20/2016  . Constipation 04/20/2016  . History of fall 04/20/2016  . Chronic radicular lumbar pain 01/27/2016  . Encounter for health maintenance examination in adult 03/22/2015  . Facial paresthesia 03/22/2015  . Cognitive decline 03/22/2015  . Screening for cervical cancer 03/22/2015  . Vitamin D deficiency 03/22/2015  . History of uterine leiomyoma 03/22/2015  . Gastroesophageal reflux disease without esophagitis 03/02/2014  . Chronic nausea 03/02/2014  . Chronic abdominal pain 03/02/2014  . Rhinitis, allergic 03/02/2014  . Essential hypertension 03/02/2014  . Hyperlipidemia 03/02/2014  . Class 1 obesity with serious comorbidity in adult 01/16/2012    Past Surgical History:  Procedure Laterality Date  . COLONOSCOPY  01/2014   diverticulosis, othwerise normal - Dr. Owens Loffler  . ESOPHAGOGASTRODUODENOSCOPY  2013   Dr. Benson Norway, gastritis  . ESOPHAGOGASTRODUODENOSCOPY  [id number]  . EUS N/A 04/16/2014   Procedure: UPPER ENDOSCOPIC ULTRASOUND (EUS) LINEAR;  Surgeon: Milus Banister, MD;  Location: [facility] ENDOSCOPY;  Service: Endoscopy;  Laterality: N/A;  . gall stone surgery    . KNEE ARTHROSCOPY Left   . LAPAROSCOPIC GASTRIC RESECTION N/A 06/09/2014   Procedure: LAPAROSCOPIC GASTRIC MASS RESECTION;  Surgeon: [name], MD;  Location: [facility] ORS;  Service: General;  Laterality: N/A;  . LIPOMA EXCISION  forehead  . UTERINE FIBROID SURGERY      OB History   None      Home Medications    Prior to Admission medications   Medication Sig Start Date End Date Taking? Authorizing Provider  aspirin EC 81 MG tablet Take 1 tablet (81 mg total) by mouth daily. 08/19/17   Tysinger, Camelia Eng, PA-C    cholecalciferol (VITAMIN D) 1000 units tablet Take 1 tablet (1,000 Units total) by mouth daily. 08/19/17   Tysinger, Camelia Eng, PA-C  cyclobenzaprine (FLEXERIL) 10 MG tablet Take 1 tablet (10 mg total) by mouth 2 (two) times daily as needed for muscle spasms. 08/29/17   Tysinger, Camelia Eng, PA-C  dexlansoprazole (DEXILANT) 60 MG capsule Take 1 capsule (60 mg total) by mouth daily. 08/19/17   Tysinger, Camelia Eng, PA-C  EQ ALLERGY RELIEF 10 MG tablet TAKE ONE TABLET BY MOUTH ONCE DAILY 05/07/17   Tysinger, Camelia Eng, PA-C  famotidine (PEPCID) 20 MG tablet Take 1 tablet (20 mg total) by mouth at bedtime. 08/19/17   Tysinger, Camelia Eng, PA-C  fluticasone (FLONASE) 50 MCG/ACT nasal spray USE ONE SPRAY(S) IN EACH NOSTRIL DAILY AS NEEDED FOR ALLERGIES OR RHINITIS. 12/08/15   Tysinger, Camelia Eng, PA-C  gabapentin (NEURONTIN) 300 MG capsule TAKE ONE TABLET BY MOUTH IN THE AFTERNOON AND 2 AT BEDTIME 08/19/17   Tysinger, Camelia Eng, PA-C  hydrocortisone (PROCTOCORT) 1 % CREA Apply 1 application topically daily. 01/23/17   Tysinger, Camelia Eng, PA-C  losartan-hydrochlorothiazide (HYZAAR) 50-12.5 MG tablet Take 1 tablet by mouth daily. 08/19/17   Tysinger, Camelia Eng, PA-C  meloxicam (MOBIC) 7.5 MG tablet Take 1 tablet (7.5 mg total) by mouth daily. 10/06/17   Tasia Catchings, Amy V, PA-C  ondansetron (ZOFRAN) 4 MG tablet TAKE ONE TABLET BY MOUTH EVERY 8 HOURS AS NEEDED FOR  NAUSEA  OR  VOMITING 01/25/17   Tysinger, Camelia Eng, PA-C  polyethylene glycol powder (GLYCOLAX/MIRALAX) powder TAKE ONE CAPFUL (17 GRAMS) BY MOUTH DAILY. 10/13/16   Tysinger, Camelia Eng, PA-C  potassium chloride (K-DUR,KLOR-CON) 10 MEQ tablet Take 1 tablet (10 mEq total) by mouth daily. 08/19/17   Tysinger, Camelia Eng, PA-C  simvastatin (ZOCOR) 20 MG tablet Take 1 tablet (20 mg total) by mouth daily. 08/19/17   Tysinger, Camelia Eng, PA-C  venlafaxine (EFFEXOR) 37.5 MG tablet Take 1 tablet (37.5 mg total) by mouth daily. 08/19/17   Tysinger, Camelia Eng, PA-C  Vitamin D, Ergocalciferol, (DRISDOL) 50000  units CAPS capsule TAKE 1 CAPSULE BY MOUTH EVERY 7 DAYS 09/24/17   Tysinger, Camelia Eng, PA-C    Family History Family History  Problem Relation Age of Onset  . Hypertension Mother   . Arthritis Mother   . GER disease Mother   . Glaucoma Mother   . Stroke Father   . Alcohol abuse Father   . Breast cancer Sister        breast cancer dx late 63s; another sister with leukemia  . Hypertension Sister   . Heart disease Sister        heart murmur; other sisters with heart problems  . Diabetes Brother   . Colon cancer Brother 43  . Diabetes Maternal Aunt   . Heart disease Maternal Aunt   . Heart disease Maternal Grandmother   . Heart disease Maternal Grandfather   . Heart disease Maternal Uncle   . Stroke Maternal Uncle   . Colon polyps Neg Hx     Social History Social History   Tobacco Use  . Smoking  status: Never Smoker  . Smokeless tobacco: Never Used  Substance Use Topics  . Alcohol use: No    Alcohol/week: 0.0 oz  . Drug use: No     Allergies   Kiwi extract; Mucinex dm [dm-guaifenesin er]; and Peach [prunus persica]   Review of Systems Review of Systems  Reason unable to perform ROS: See HPI as above.     Physical Exam Triage Vital Signs ED Triage Vitals  Enc Vitals Group     BP 10/06/17 1350 101/66     Pulse Rate 10/06/17 1350 90     Resp 10/06/17 1350 18     Temp 10/06/17 1350 98.3 F (36.8 C)     Temp Source 10/06/17 1350 Oral     SpO2 10/06/17 1350 98 %     Weight --      Height --      Head Circumference --      Peak Flow --      Pain Score 10/06/17 1349 9     Pain Loc --      Pain Edu? --      Excl. in [place]? --    No data found.  Updated Vital Signs BP 101/66 (BP Location: Right Arm)   Pulse 90   Temp 98.3 F (36.8 C) (Oral)   Resp 18   LMP 02/23/2015   SpO2 98%   Physical Exam  Constitutional: She is oriented to person, place, and time. She appears well-developed and well-nourished. No distress.  HENT:  Head: Normocephalic and  atraumatic.  Eyes: Pupils are equal, round, and reactive to light. Conjunctivae are normal.  Cardiovascular: Normal rate, regular rhythm and normal heart sounds. Exam reveals no gallop and no friction rub.  No murmur heard. Pulmonary/Chest: Effort normal and breath sounds normal. No accessory muscle usage or stridor. No respiratory distress. She has no decreased breath sounds. She has no wheezes. She has no rhonchi. She has no rales.  Abdominal: Soft. Bowel sounds are normal. There is no tenderness. There is no rigidity, no rebound, no guarding and no CVA tenderness.  Patient with surgical scar to the mid abdomen.  Musculoskeletal:  No rashes seen on the back.  No tenderness to palpation of the spinous processes.  Tenderness to palpation of right thoracic back, right lower ribs.  Full range of motion of shoulder, back.  Strength normal and equal bilaterally.  Sensation intact and equal bilaterally.  Radial pulse 2+ and equal bilaterally.  Cap refill less than 2 seconds.  Neurological: She is alert and oriented to person, place, and time.  Skin: Skin is warm and dry. She is not diaphoretic.     UC Treatments / Results  Labs (all labs ordered are listed, but only abnormal results are displayed) Labs Reviewed - No data to display  EKG None  Radiology Dg Ribs Unilateral W/chest Right  Result Date: 10/06/2017 CLINICAL DATA:  Right-sided rib pain.  No known injury. EXAM: RIGHT RIBS AND CHEST - 3+ VIEW COMPARISON:  05/17/2015; 08/04/2013; chest CT-08/04/2013 FINDINGS: Grossly unchanged cardiac silhouette and mediastinal contours. Suspected trace right-sided pleural effusion with associated right basilar opacities. Previously characterized calcified granuloma within the subpleural aspect the left lower lobe is unchanged compared to remote chest radiograph performed 08/2013. No pneumothorax. No evidence of edema. No acute osseus abnormalities. Specifically, no definite displaced right-sided rib  fractures. Post cholecystectomy.  Regional soft tissues appear normal. IMPRESSION: 1. Trace right-sided effusion with associated right basilar opacities, atelectasis versus infiltrate. A follow-up chest  radiograph in 3 to 4 weeks after treatment is recommended to ensure resolution. 2. No definite displaced right-sided rib fractures. Electronically Signed   By: Sandi Mariscal M.D.   On: 10/06/2017 15:11    Procedures Procedures (including critical care time)  Medications Ordered in UC Medications - No data to display  Initial Impression / Assessment and Plan / UC Course  I have reviewed the triage vital signs and the nursing notes.  Pertinent labs & imaging results that were available during my care of the patient were reviewed by me and considered in my medical decision making (see chart for details).    Discussed x-ray findings with patient.  Discussed with patient, x-ray findings does not seem to be contributing to current symptoms.  Patient afebrile without tachycardia, tachypnea, hypoxia.  She is without chest pain, shortness of breath, cough.  Current symptoms worse likely due to muscle strain.  Patient to start Mobic.  On compress.  Patient to follow-up with PCP for further evaluation, monitoring, management of x-ray findings.  Strict return precautions given.  Patient expresses understanding and agrees to plan.  Case discussed with Dr. Nani Ravens, who agrees to plan.  Final Clinical Impressions(s) / UC Diagnoses   Final diagnoses:  Upper back pain on right side    ED Prescriptions    Medication Sig Dispense Auth. Provider   meloxicam (MOBIC) 7.5 MG tablet Take 1 tablet (7.5 mg total) by mouth daily. 15 tablet Tobin Chad, [place] 10/06/17 1626

## 2017-10-08 NOTE — Telephone Encounter (Signed)
Is this ok to refill?  Discontinued by another provider?

## 2017-10-10 NOTE — Progress Notes (Signed)
Office Visit Note  [name]             Date of Birth: September 15, 1960           MRN: [id number]             PCP: Carlena Hurl, PA-C Referring: Carlena Hurl, PA-C Visit Date: 10/24/2017 Occupation: Mchinist  Subjective:  Pain in multiple joints.   History of Present Illness: Lisa Briggs is a 57 y.o. female seen in consultation per request of her PCP.  According to patient when she moved to [facility] in 2012 she was having some lower back pain and she was told that she had arthritis.  For the last 3 to 4 years she has been experiencing increased discomfort in her joints.  She describes discomfort in her bilateral hands, bilateral knees, bilateral ankles and feet.  She states she is quite stiff after sitting and after she moves around the stiffness gets better.  She has noticed swelling in her hands and ankles.  She also complains of some discomfort in the trochanteric area.  Activities of Daily Living:  Patient reports morning stiffness for a couple of hours.   Patient Reports nocturnal pain.  Difficulty dressing/grooming: Denies Difficulty climbing stairs: Reports Difficulty getting out of chair: Reports Difficulty using hands for taps, buttons, cutlery, and/or writing: Reports  Review of Systems  Constitutional: Positive for fatigue. Negative for night sweats, weight gain and weight loss.  HENT: Positive for mouth dryness. Negative for mouth sores, trouble swallowing, trouble swallowing and nose dryness.   Eyes: Positive for dryness. Negative for pain, redness and visual disturbance.  Respiratory: Negative for cough, shortness of breath and difficulty breathing.   Cardiovascular: Negative for chest pain, palpitations, hypertension, irregular heartbeat and swelling in legs/feet.  Gastrointestinal: Negative for abdominal pain, blood in stool, constipation and diarrhea.  Endocrine: Negative for increased urination.  Genitourinary: Negative for painful urination and  vaginal dryness.  Musculoskeletal: Positive for arthralgias, joint pain, joint swelling and morning stiffness. Negative for myalgias, muscle weakness, muscle tenderness and myalgias.  Skin: Negative for color change, rash, hair loss, skin tightness, ulcers and sensitivity to sunlight.  Allergic/Immunologic: Negative for susceptible to infections.  Neurological: Negative for dizziness, light-headedness, headaches, memory loss, night sweats and weakness.  Hematological: Negative for bruising/bleeding tendency and swollen glands.  Psychiatric/Behavioral: Negative for depressed mood, confusion and sleep disturbance. The patient is not nervous/anxious.     PMFS History:  Patient Active Problem List   Diagnosis Date Noted  . Vaccine counseling 08/13/2017  . Polyarthralgia 08/13/2017  . Joint stiffness 08/13/2017  . Sensitive skin 01/23/2017  . Need for influenza vaccination 01/23/2017  . Proteinuria 01/23/2017  . History of gastrointestinal stromal tumor (GIST) 04/20/2016  . History of TIA (transient ischemic attack) 04/20/2016  . Screening for breast cancer 04/20/2016  . Estrogen deficiency 04/20/2016  . Chronic maxillary sinusitis 04/20/2016  . Impaired fasting blood sugar 04/20/2016  . Constipation 04/20/2016  . History of fall 04/20/2016  . Chronic radicular lumbar pain 01/27/2016  . Encounter for health maintenance examination in adult 03/22/2015  . Facial paresthesia 03/22/2015  . Cognitive decline 03/22/2015  . Screening for cervical cancer 03/22/2015  . Vitamin D deficiency 03/22/2015  . History of uterine leiomyoma 03/22/2015  . Gastroesophageal reflux disease without esophagitis 03/02/2014  . Chronic nausea 03/02/2014  . Chronic abdominal pain 03/02/2014  . Rhinitis, allergic 03/02/2014  . Essential hypertension 03/02/2014  . Hyperlipidemia 03/02/2014  . Class 1 obesity with  serious comorbidity in adult 01/16/2012    Past Medical History:  Diagnosis Date  . Allergy   .  Anemia    iron therapy for years as of 10/12; normal Hgb 08/2013  . Arthritis   . Chest pain 04/05/2011   cardiac eval, normal treadmill stress test, Dr. Tollie Eth  . Chronic back pain   . Constipation   . Farsightedness    wears glasses, [facility] care [facility]  . 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 [name], [facility] Surgery  . GERD (gastroesophageal reflux disease)   . History of uterine fibroid   . Hyperlipidemia   . Hypertension   . Paresthesia 09/2014   initially thought to be TIA, neurology consult in 12/2014 with other non TIA considerations.    . Polyarthralgia    normal rheumatoid screen 01/2012    Family History  Problem Relation Age of Onset  . Hypertension Mother   . Arthritis Mother   . GER disease Mother   . Glaucoma Mother   . Breast cancer Mother   . Stroke Father   . Breast cancer Sister        breast cancer dx late 71s  . Hypertension Sister   . Colon cancer Brother 39  . Diabetes Brother   . Diabetes Maternal Aunt   . Heart disease Maternal Aunt   . Heart disease Maternal Grandmother   . Heart disease Maternal Grandfather   . Heart disease Maternal Uncle   . Stroke Maternal Uncle   . Leukemia Sister   . Hypertension Sister   . Healthy Son   . Colon polyps Neg Hx    Past Surgical History:  Procedure Laterality Date  . COLONOSCOPY  01/2014   diverticulosis, othwerise normal - Dr. Owens Loffler  . ESOPHAGOGASTRODUODENOSCOPY  2013   Dr. Benson Norway, gastritis  . ESOPHAGOGASTRODUODENOSCOPY  [id number]  . EUS N/A 04/16/2014   Procedure: UPPER ENDOSCOPIC ULTRASOUND (EUS) LINEAR;  Surgeon: Milus Banister, MD;  Location: [facility] ENDOSCOPY;  Service: Endoscopy;  Laterality: N/A;  . gall stone surgery    . KNEE ARTHROSCOPY Left   . LAPAROSCOPIC GASTRIC RESECTION N/A 06/09/2014   Procedure: LAPAROSCOPIC GASTRIC MASS RESECTION;  Surgeon: [name], MD;  Location: [facility] ORS;  Service: General;  Laterality: N/A;  . LIPOMA EXCISION      forehead  . UTERINE FIBROID SURGERY     Social History   Social History Narrative   Married, has 1 son in [place] and some grandchildren.  Glass blower/designer.  Active on job.  Does stretching and exercises daily as per physical therapy.  Works 12 hours daily.      Objective: Vital Signs: BP (!) 135/91 (BP Location: Right Arm, Patient Position: Sitting, Cuff Size: Large)   Pulse 75   Resp 14   Ht 5' 7.72" (1.72 m)   Wt 214 lb (97.1 kg)   LMP 02/23/2015   BMI 32.81 kg/m    Physical Exam  Constitutional: She is oriented to person, place, and time. She appears well-developed and well-nourished.  HENT:  Head: Normocephalic and atraumatic.  Eyes: Conjunctivae and EOM are normal.  Neck: Normal range of motion.  Cardiovascular: Normal rate, regular rhythm, normal heart sounds and intact distal pulses.  Pulmonary/Chest: Effort normal and breath sounds normal.  Abdominal: Soft. Bowel sounds are normal.  Lymphadenopathy:    She has no cervical adenopathy.  Neurological: She is alert and oriented to person, place, and time.  Skin: Skin is warm and  dry. Capillary refill takes less than 2 seconds.  Psychiatric: She has a normal mood and affect. Her behavior is normal.  Nursing note and vitals reviewed.    Musculoskeletal Exam: C-spine thoracic spine good range of motion.  She has discomfort range of motion of her lumbar spine.  Shoulder joints elbow joints wrist joint MCPs PIPs DIPs were in good range of motion with no synovitis.  She has DIP and PIP thickening in her hands consistent with osteoarthritis.  She has discomfort range of motion of her hip joints knee joints ankles MTPs PIPs without any synovitis.  But this was noted in bilateral knee joints.  CDAI Exam: No CDAI exam completed.   Investigation: Findings:  08/13/2017: Uric acid 3.4, RF 31.8, sed rate 17, CCP 23, lipid panel within normal limits, hemoglobin A1c 5.8   Component     Latest Ref Rng & Units 08/13/2017  Cholesterol,  Total     100 - 199 mg/dL 167  Triglycerides     0 - 149 mg/dL 42  HDL Cholesterol     >39 mg/dL 84  VLDL Cholesterol Cal     5 - 40 mg/dL 8  LDL (calc)     0 - 99 mg/dL 75  Total CHOL/HDL Ratio     0.0 - 4.4 ratio 2.0  Hemoglobin A1C     4.8 - 5.6 % 5.8 (H)  Est. average glucose Bld gHb Est-mCnc     mg/dL 120  Uric Acid     2.5 - 7.1 mg/dL 3.4  RA Latex Turbid.     0.0 - 13.9 IU/mL 31.8 (H)  Sed Rate     0 - 40 mm/hr 17  Cyclic Citrullin Peptide Ab     0 - 19 units 23 (H)   CBC Latest Ref Rng & Units 09/12/2016 04/20/2016 09/27/2015  WBC 4.0 - 10.5 K/uL 5.3 6.5 8.3  Hemoglobin 11.7 - 15.5 g/dL 12.9 12.5 12.8  Hematocrit 35.0 - 45.0 % 42.3 39.9 40.8  Platelets 140 - 400 K/uL 375 360 353   CMP Latest Ref Rng & Units 09/12/2016 04/20/2016 09/27/2015  Glucose 65 - 99 mg/dL 91 86 90  BUN 7 - 25 mg/dL 13 16 13   Creatinine 0.50 - 1.05 mg/dL 0.77 0.63 0.87  Sodium 135 - 146 mmol/L 141 141 140  Potassium 3.5 - 5.3 mmol/L 4.6 4.4 4.8  Chloride 98 - 110 mmol/L 105 106 109  CO2 20 - 31 mmol/L 29 30 22   Calcium 8.6 - 10.4 mg/dL 9.6 9.2 9.7  Total Protein 6.1 - 8.1 g/dL - 6.9 7.4  Total Bilirubin 0.2 - 1.2 mg/dL - 0.5 0.7  Alkaline Phos 33 - 130 U/L - 96 107  AST 10 - 35 U/L - 15 28  ALT 6 - 29 U/L - 13 24    Imaging: Dg Ribs Unilateral W/chest Right  Result Date: 10/06/2017 CLINICAL DATA:  Right-sided rib pain.  No known injury. EXAM: RIGHT RIBS AND CHEST - 3+ VIEW COMPARISON:  05/17/2015; 08/04/2013; chest CT-08/04/2013 FINDINGS: Grossly unchanged cardiac silhouette and mediastinal contours. Suspected trace right-sided pleural effusion with associated right basilar opacities. Previously characterized calcified granuloma within the subpleural aspect the left lower lobe is unchanged compared to remote chest radiograph performed 08/2013. No pneumothorax. No evidence of edema. No acute osseus abnormalities. Specifically, no definite displaced right-sided rib fractures. Post  cholecystectomy.  Regional soft tissues appear normal. IMPRESSION: 1. Trace right-sided effusion with associated right basilar opacities, atelectasis versus infiltrate. A follow-up  chest radiograph in 3 to 4 weeks after treatment is recommended to ensure resolution. 2. No definite displaced right-sided rib fractures. Electronically Signed   By: Sandi Mariscal M.D.   On: 10/06/2017 15:11   Xr Hip Unilat W Or W/o Pelvis 2-3 Views Left  Result Date: 10/24/2017 No SI joint changes were noted.  No hip joint narrowing was noted.  Xr Hip Unilat W Or W/o Pelvis 2-3 Views Right  Result Date: 10/24/2017 No SI joint changes were noted.  No hip joint narrowing was noted.  No chondrocalcinosis was noted.  Xr Foot 2 Views Left  Result Date: 10/24/2017 First MTP PIP and DIP narrowing was noted.  No intertarsal joint space narrowing was noted.  No erosive changes were noted.  A posterior calcaneal spur was noted. Impression: These findings are consistent with osteoarthritis of the foot.  Xr Foot 2 Views Right  Result Date: 10/24/2017 First MTP PIP and DIP narrowing was noted.  No intertarsal joint space narrowing was noted.  No erosive changes were noted. Impression: These findings are consistent with osteoarthritis of the foot.  Xr Hand 2 View Left  Result Date: 10/24/2017 PIP and DIP narrowing was noted.  No intercarpal radiocarpal joint space narrowing was noted.  No erosive changes were noted. Impression: X-ray was consistent with osteoarthritis of the hand.  Xr Hand 2 View Right  Result Date: 10/24/2017 No MCP, intercarpal or radiocarpal changes were noted.  Severe PIP and DIP narrowing was noted. Impression: These findings are consistent with severe osteoarthritis of the hand.  Xr Knee 3 View Left  Result Date: 10/24/2017 Moderate medial compartment narrowing was noted.  Intercondylar osteophytes and medial osteophytes were noted.  No chondrocalcinosis was noted.  Severe patellofemoral narrowing was  noted. 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  Intercondylar osteophytes and medial osteophytes were noted.  No chondrocalcinosis was noted.  Severe patellofemoral narrowing was noted. 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   Recent Labs: Lab Results  Component Value Date   WBC 5.3 09/12/2016   HGB 12.9 09/12/2016   PLT 375 09/12/2016   NA 141 09/12/2016   K 4.6 09/12/2016   CL 105 09/12/2016   CO2 29 09/12/2016   GLUCOSE 91 09/12/2016   BUN 13 09/12/2016   CREATININE 0.77 09/12/2016   BILITOT 0.5 04/20/2016   ALKPHOS 96 04/20/2016   AST 15 04/20/2016   ALT 13 04/20/2016   PROT 6.9 04/20/2016   ALBUMIN 3.7 04/20/2016   CALCIUM 9.6 09/12/2016   GFRAA >60 09/27/2015    Speciality Comments: No specialty comments available.  Procedures:  No procedures performed Allergies: Kiwi extract; Mucinex dm [dm-guaifenesin er]; and Peach [prunus persica]   Assessment / Plan:     Visit Diagnoses: Polyarthralgia -patient complains of pain in multiple joints.  There was no synovitis on examination today.  The x-rays were unremarkable except for osteoarthritis.  I will schedule ultrasound of bilateral hands to look for synovitis.  There is a strong family history of rheumatoid arthritis and autoimmune disease in her family.  08/13/2017: Uric acid 3.4, RF 31.8, sed rate 17, CCP 23  Pain in both hands -she had only DIP and PIP thickening without synovitis.  Plan: XR Hand 2 View Right, XR Hand 2 View Left.  X-rays were consistent with osteoarthritis of the hands.  Chronic pain of both hips -  Plan: XR HIP UNILAT W OR W/O PELVIS 2-3 VIEWS RIGHT, XR HIP UNILAT W OR W/O PELVIS 2-3 VIEWS LEFT.  X-rays were unremarkable.  Chronic pain of both knees - Plan: XR KNEE 3 VIEW RIGHT, XR KNEE 3 VIEW LEFT.  X-ray  showed moderate osteoarthritis and severe chondral malacia patella.  Pain in both feet - Plan: XR Foot 2 Views Right, XR Foot 2 Views Left.  The x-ray showed mild osteoarthritic changes in her feet.  DDD (degenerative disc disease), lumbar-he gives history of chronic lower back pain and disc disease.  She has had cortisone injections in the past.  Essential hypertension-her blood pressure is mildly elevated today.  History of TIA (transient ischemic attack)  History of gastroesophageal reflux (GERD)  Vitamin D deficiency  History of gastrointestinal stromal tumor (GIST)  History of hyperlipidemia   Orders: Orders Placed This Encounter  Procedures  . XR Hand 2 View Right  . XR Hand 2 View Left  . XR HIP UNILAT W OR W/O PELVIS 2-3 VIEWS RIGHT  . XR HIP UNILAT W OR W/O PELVIS 2-3 VIEWS LEFT  . XR KNEE 3 VIEW RIGHT  . XR KNEE 3 VIEW LEFT  . XR Foot 2 Views Right  . XR Foot 2 Views Left   No orders of the defined types were placed in this encounter.   Face-to-face time spent with patient was 50 minutes. Greater than 50% of time was spent in counseling and coordination of care.  Follow-Up Instructions: Return for Polyarthralgia.   Bo Merino, MD  Note - This record has been created using Editor, commissioning.  Chart creation errors have been sought, but may not always  have been located. Such creation errors do not reflect on  the standard of medical care.

## 2017-10-15 ENCOUNTER — Ambulatory Visit (INDEPENDENT_AMBULATORY_CARE_PROVIDER_SITE_OTHER): Payer: BLUE CROSS/BLUE SHIELD | Admitting: Medical

## 2017-10-15 VITALS — BP 120/72 | HR 100 | Temp 98.1°F | Resp 16 | Ht 68.0 in | Wt 216.0 lb

## 2017-10-15 DIAGNOSIS — S39012A Strain of muscle, fascia and tendon of lower back, initial encounter: Secondary | ICD-10-CM | POA: Diagnosis not present

## 2017-10-15 DIAGNOSIS — M546 Pain in thoracic spine: Secondary | ICD-10-CM | POA: Diagnosis not present

## 2017-10-15 DIAGNOSIS — E559 Vitamin D deficiency, unspecified: Secondary | ICD-10-CM | POA: Diagnosis not present

## 2017-10-15 MED ORDER — VITAMIN D (ERGOCALCIFEROL) 1.25 MG (50000 UNIT) PO CAPS: 50000.0000 [IU] | ORAL_CAPSULE | ORAL | 0 refills | Status: DC

## 2017-10-15 MED ORDER — VITAMIN D 1000 UNITS PO TABS: 1000.0000 [IU] | ORAL_TABLET | Freq: Every day | ORAL | 3 refills | Status: DC

## 2017-10-15 MED ORDER — MELOXICAM 7.5 MG PO TABS: 7.5000 mg | ORAL_TABLET | Freq: Every day | ORAL | 1 refills | Status: DC

## 2017-10-15 NOTE — Progress Notes (Signed)
Subjective: Chief Complaint  Patient presents with  . follow up    right side back pain   urgent care follow up   Here for emergency department follow-up.  Went to the emergency department 10/06/2017 for right upper back pain intermittent for a while worse in the short-term.  Sometimes gets pain radiating down the right arm.  Denies numbness, neck pain, tingling, weakness, she does use twisting and pulling motions on the job and does strenuous work on the job.  Denies fall, trauma.   Pain initially came out of no where.    No other aggravating or relieving factors. No other complaint.   Past Medical History:  Diagnosis Date  . Allergy   . Anemia    iron therapy for years as of 10/12; normal Hgb 08/2013  . Arthritis   . Chest pain 04/05/2011   cardiac eval, normal treadmill stress test, Dr. Tollie Eth  . Chronic back pain   . Constipation   . Farsightedness    wears glasses, [facility] care center  . Gastrointestinal stromal tumor (GIST) ([place]) 06/2014   Dr. [name], [facility]  . GERD (gastroesophageal reflux disease)   . History of uterine fibroid   . Hyperlipidemia   . Hypertension   . Paresthesia 09/2014   initially thought to be TIA, neurology consult in 12/2014 with other non TIA considerations.    . Polyarthralgia    normal rheumatoid screen 01/2012   Current Outpatient Medications on File Prior to Visit  Medication Sig Dispense Refill  . aspirin EC 81 MG tablet Take 1 tablet (81 mg total) by mouth daily. 90 tablet 3  . cyclobenzaprine (FLEXERIL) 10 MG tablet Take 1 tablet (10 mg total) by mouth 2 (two) times daily as needed for muscle spasms. 20 tablet 0  . dexlansoprazole (DEXILANT) 60 MG capsule Take 1 capsule (60 mg total) by mouth daily. 90 capsule 3  . EQ ALLERGY RELIEF 10 MG tablet TAKE ONE TABLET BY MOUTH ONCE DAILY 30 tablet 11  . famotidine (PEPCID) 20 MG tablet Take 1 tablet (20 mg total) by mouth at bedtime. 90 tablet 3  . fluticasone (FLONASE) 50  MCG/ACT nasal spray USE ONE SPRAY(S) IN EACH NOSTRIL DAILY AS NEEDED FOR ALLERGIES OR RHINITIS. 16 g 11  . gabapentin (NEURONTIN) 300 MG capsule TAKE ONE TABLET BY MOUTH IN THE AFTERNOON AND 2 AT BEDTIME 270 capsule 3  . hydrocortisone (PROCTOCORT) 1 % CREA Apply 1 application topically daily. 45 g 1  . losartan-hydrochlorothiazide (HYZAAR) 50-12.5 MG tablet Take 1 tablet by mouth daily. 90 tablet 3  . ondansetron (ZOFRAN) 4 MG tablet TAKE ONE TABLET BY MOUTH EVERY 8 HOURS AS NEEDED FOR  NAUSEA  OR  VOMITING 20 tablet 2  . polyethylene glycol powder (GLYCOLAX/MIRALAX) powder TAKE ONE CAPFUL (17 GRAMS) BY MOUTH DAILY. 850 g 3  . potassium chloride (K-DUR,KLOR-CON) 10 MEQ tablet Take 1 tablet (10 mEq total) by mouth daily. 90 tablet 3  . simvastatin (ZOCOR) 20 MG tablet Take 1 tablet (20 mg total) by mouth daily. 90 tablet 3  . venlafaxine (EFFEXOR) 37.5 MG tablet Take 1 tablet (37.5 mg total) by mouth daily. 90 tablet 1   No current facility-administered medications on file prior to visit.    ROS as in subjective    Objective: BP 120/72   Pulse 100   Temp 98.1 F (36.7 C) (Oral)   Resp 16   Ht 5\' 8"  (1.727 m)   Wt 216 lb (98  kg)   LMP 02/23/2015   SpO2 96%   BMI 32.84 kg/m   Gen: wd, wn, nad Skin: no bruising, no erythema Back: tender along right mid to low flank area, otherwise nontender, relatively normal ROM Legs nontender, no swelling, normal ROM Ext: no edema Pulses WNL       Assessment: Encounter Diagnoses  Name Primary?  . Vitamin D deficiency Yes  . Acute right-sided thoracic back pain   . Back strain, initial encounter      Plan: Vit D deficiency - c/t daily 1000 U Vit D  Back pain and strain -  Mostly resolved.  Can use Meloxicam prn for intermittent back pains.  Discussed routine stretching and calisthenics   Lisa Briggs was seen today for follow up.  Diagnoses and all orders for this visit:  Vitamin D deficiency  Acute right-sided thoracic back  pain  Back strain, initial encounter  Other orders -     meloxicam (MOBIC) 7.5 MG tablet; Take 1 tablet (7.5 mg total) by mouth daily. -     Vitamin D, Ergocalciferol, (DRISDOL) 50000 units CAPS capsule; Take 1 capsule (50,000 Units total) by mouth every 7 (seven) days. DISCONTINUE THE 50K WEEKLY DOSE, she is taing the 1000u daily dose -     cholecalciferol (VITAMIN D) 1000 units tablet; Take 1 tablet (1,000 Units total) by mouth daily.

## 2017-10-24 ENCOUNTER — Ambulatory Visit (INDEPENDENT_AMBULATORY_CARE_PROVIDER_SITE_OTHER): Payer: Self-pay

## 2017-10-24 ENCOUNTER — Encounter: Payer: Self-pay | Admitting: Rheumatology

## 2017-10-24 ENCOUNTER — Ambulatory Visit (INDEPENDENT_AMBULATORY_CARE_PROVIDER_SITE_OTHER): Payer: BLUE CROSS/BLUE SHIELD | Admitting: Rheumatology

## 2017-10-24 VITALS — BP 135/91 | HR 75 | Resp 14 | Ht 67.72 in | Wt 214.0 lb

## 2017-10-24 DIAGNOSIS — M25552 Pain in left hip: Secondary | ICD-10-CM

## 2017-10-24 DIAGNOSIS — M25561 Pain in right knee: Secondary | ICD-10-CM | POA: Diagnosis not present

## 2017-10-24 DIAGNOSIS — M79642 Pain in left hand: Secondary | ICD-10-CM

## 2017-10-24 DIAGNOSIS — M79672 Pain in left foot: Secondary | ICD-10-CM | POA: Diagnosis not present

## 2017-10-24 DIAGNOSIS — M79641 Pain in right hand: Secondary | ICD-10-CM | POA: Diagnosis not present

## 2017-10-24 DIAGNOSIS — M51369 Other intervertebral disc degeneration, lumbar region without mention of lumbar back pain or lower extremity pain: Secondary | ICD-10-CM

## 2017-10-24 DIAGNOSIS — Z8673 Personal history of transient ischemic attack (TIA), and cerebral infarction without residual deficits: Secondary | ICD-10-CM

## 2017-10-24 DIAGNOSIS — M2550 Pain in unspecified joint: Secondary | ICD-10-CM | POA: Diagnosis not present

## 2017-10-24 DIAGNOSIS — G8929 Other chronic pain: Secondary | ICD-10-CM

## 2017-10-24 DIAGNOSIS — Z8509 Personal history of malignant neoplasm of other digestive organs: Secondary | ICD-10-CM

## 2017-10-24 DIAGNOSIS — M79671 Pain in right foot: Secondary | ICD-10-CM | POA: Diagnosis not present

## 2017-10-24 DIAGNOSIS — M25562 Pain in left knee: Secondary | ICD-10-CM

## 2017-10-24 DIAGNOSIS — I10 Essential (primary) hypertension: Secondary | ICD-10-CM

## 2017-10-24 DIAGNOSIS — M25551 Pain in right hip: Secondary | ICD-10-CM

## 2017-10-24 DIAGNOSIS — Z8719 Personal history of other diseases of the digestive system: Secondary | ICD-10-CM

## 2017-10-24 DIAGNOSIS — M5136 Other intervertebral disc degeneration, lumbar region: Secondary | ICD-10-CM

## 2017-10-24 DIAGNOSIS — Z8639 Personal history of other endocrine, nutritional and metabolic disease: Secondary | ICD-10-CM

## 2017-10-24 DIAGNOSIS — E559 Vitamin D deficiency, unspecified: Secondary | ICD-10-CM

## 2017-10-30 ENCOUNTER — Ambulatory Visit: Payer: BLUE CROSS/BLUE SHIELD | Admitting: Rheumatology

## 2017-11-06 ENCOUNTER — Other Ambulatory Visit: Payer: Self-pay | Admitting: Medical

## 2017-11-07 NOTE — Telephone Encounter (Signed)
Is this ok to refill?  

## 2017-11-14 ENCOUNTER — Other Ambulatory Visit: Payer: BLUE CROSS/BLUE SHIELD | Admitting: Rheumatology

## 2017-11-14 ENCOUNTER — Ambulatory Visit (INDEPENDENT_AMBULATORY_CARE_PROVIDER_SITE_OTHER): Payer: BLUE CROSS/BLUE SHIELD | Admitting: Rheumatology

## 2017-11-14 ENCOUNTER — Ambulatory Visit (INDEPENDENT_AMBULATORY_CARE_PROVIDER_SITE_OTHER): Payer: Self-pay

## 2017-11-14 DIAGNOSIS — M79641 Pain in right hand: Secondary | ICD-10-CM

## 2017-11-14 DIAGNOSIS — M79642 Pain in left hand: Secondary | ICD-10-CM | POA: Diagnosis not present

## 2017-11-14 NOTE — Progress Notes (Signed)
Ultrasound examination of bilateral hands was performed per EULAR recommendations. Using 12 MHz transducer, grayscale and power Doppler bilateral second, third, and fifth MCP joints and bilateral wrist joints both dorsal and volar aspects were evaluated to look for synovitis or tenosynovitis. The findings were there was no synovitis or tenosynovitis on ultrasound examination. Right median nerve was 0.10 cm squares which was normal limits and left median nerve was 0.15 cm squares which was more than upper limits of normal.  Impression: Ultrasound examination not show any synovitis.  The left median nerve was enlarged.  I offered patient a nerve conduction velocity to evaluate for carpal tunnel syndrome, she was in agreement.  We will schedule nerve conduction velocity to evaluate this further.  We have also given her prescription for bilateral carpal tunnel braces.  The ultrasound examination is negative for this.  Although she does have positive rheumatoid factor and positive anti-CCP.  Dietary modification, exercise, dental flossing and avoidance of smoking was discussed.  Bo Merino, MD

## 2017-11-23 DIAGNOSIS — R768 Other specified abnormal immunological findings in serum: Secondary | ICD-10-CM | POA: Insufficient documentation

## 2017-11-23 DIAGNOSIS — M19042 Primary osteoarthritis, left hand: Secondary | ICD-10-CM | POA: Insufficient documentation

## 2017-11-23 DIAGNOSIS — M19041 Primary osteoarthritis, right hand: Secondary | ICD-10-CM | POA: Insufficient documentation

## 2017-11-23 DIAGNOSIS — M19072 Primary osteoarthritis, left ankle and foot: Secondary | ICD-10-CM

## 2017-11-23 DIAGNOSIS — M5136 Other intervertebral disc degeneration, lumbar region: Secondary | ICD-10-CM | POA: Insufficient documentation

## 2017-11-23 DIAGNOSIS — M19071 Primary osteoarthritis, right ankle and foot: Secondary | ICD-10-CM | POA: Insufficient documentation

## 2017-11-23 DIAGNOSIS — M170 Bilateral primary osteoarthritis of knee: Secondary | ICD-10-CM | POA: Insufficient documentation

## 2017-11-23 NOTE — Progress Notes (Signed)
Office Visit Note  [name]             Date of Birth: 07/15/1960           MRN: [id number]             PCP: Carlena Hurl, PA-C Referring: Carlena Hurl, PA-C Visit Date: 12/05/2017 Occupation: @GUAROCC @  Subjective:  Pain in multiple joints.   History of Present Illness: Lisa Briggs is a 57 y.o. female with history of pain in multiple joints.  She states she continues to have discomfort and intermittent swelling in her bilateral hands.  She also has discomfort in her bilateral hips and bilateral knee joints.  She has noticed some swelling in her ankles but not much in her feet.  Her back pain persist.  Activities of Daily Living:  Patient reports morning stiffness for 5 minutes.   Patient Reports nocturnal pain.  Lower back Difficulty dressing/grooming: Denies Difficulty climbing stairs: Reports Difficulty getting out of chair: Reports Difficulty using hands for taps, buttons, cutlery, and/or writing: Reports  Review of Systems  Constitutional: Negative for fatigue, night sweats, weight gain and weight loss.  HENT: Positive for mouth dryness. Negative for mouth sores, trouble swallowing, trouble swallowing and nose dryness.   Eyes: Positive for dryness. Negative for pain, redness and visual disturbance.  Respiratory: Negative for cough, shortness of breath and difficulty breathing.   Cardiovascular: Negative for chest pain, palpitations, hypertension, irregular heartbeat and swelling in legs/feet.  Gastrointestinal: Negative for blood in stool, constipation and diarrhea.  Endocrine: Negative for increased urination.  Genitourinary: Negative for vaginal dryness.  Musculoskeletal: Positive for arthralgias, joint pain, joint swelling and morning stiffness. Negative for myalgias, muscle weakness, muscle tenderness and myalgias.  Skin: Negative for color change, rash, hair loss, skin tightness, ulcers and sensitivity to sunlight.  Allergic/Immunologic: Negative  for susceptible to infections.  Neurological: Negative for dizziness, memory loss, night sweats and weakness.  Hematological: Negative for swollen glands.  Psychiatric/Behavioral: Negative for depressed mood and sleep disturbance. The patient is nervous/anxious.     PMFS History:  Patient Active Problem List   Diagnosis Date Noted  . Leg swelling 12/05/2017  . Need for shingles vaccine 12/05/2017  . Primary osteoarthritis of both hands 11/23/2017  . Rheumatoid factor positive 11/23/2017  . Primary osteoarthritis of both knees 11/23/2017  . Primary osteoarthritis of both feet 11/23/2017  . DDD (degenerative disc disease), lumbar 11/23/2017  . Vaccine counseling 08/13/2017  . Polyarthralgia 08/13/2017  . Joint stiffness 08/13/2017  . Sensitive skin 01/23/2017  . Need for influenza vaccination 01/23/2017  . Proteinuria 01/23/2017  . History of gastrointestinal stromal tumor (GIST) 04/20/2016  . History of TIA (transient ischemic attack) 04/20/2016  . Screening for breast cancer 04/20/2016  . Estrogen deficiency 04/20/2016  . Chronic maxillary sinusitis 04/20/2016  . Impaired fasting blood sugar 04/20/2016  . Constipation 04/20/2016  . History of fall 04/20/2016  . Chronic radicular lumbar pain 01/27/2016  . Encounter for health maintenance examination in adult 03/22/2015  . Paresthesia 03/22/2015  . Cognitive decline 03/22/2015  . Screening for cervical cancer 03/22/2015  . Vitamin D deficiency 03/22/2015  . History of uterine leiomyoma 03/22/2015  . Gastroesophageal reflux disease without esophagitis 03/02/2014  . Chronic nausea 03/02/2014  . Chronic abdominal pain 03/02/2014  . Rhinitis, allergic 03/02/2014  . Essential hypertension 03/02/2014  . Hyperlipidemia 03/02/2014  . Class 1 obesity with serious comorbidity in adult 01/16/2012    Past Medical History:  Diagnosis Date  .  Allergy   . Anemia    iron therapy for years as of 10/12; normal Hgb 08/2013  . Arthritis     . Chest pain 04/05/2011   cardiac eval, normal treadmill stress test, Dr. Tollie Eth  . Chronic back pain   . Constipation   . Farsightedness    wears glasses, [facility] care center  . Gastrointestinal stromal tumor (GIST) ([place]) 06/2014   Dr. [name], [facility]  . GERD (gastroesophageal reflux disease)   . History of uterine fibroid   . Hyperlipidemia   . Hypertension   . Paresthesia 09/2014   initially thought to be TIA, neurology consult in 12/2014 with other non TIA considerations.    . Polyarthralgia    normal rheumatoid screen 01/2012    Family History  Problem Relation Age of Onset  . Hypertension Mother   . Arthritis Mother   . GER disease Mother   . Glaucoma Mother   . Breast cancer Mother   . Stroke Father   . Breast cancer Sister        breast cancer dx late 68s  . Hypertension Sister   . Colon cancer Brother 61  . Diabetes Brother   . Diabetes Maternal Aunt   . Heart disease Maternal Aunt   . Heart disease Maternal Grandmother   . Heart disease Maternal Grandfather   . Heart disease Maternal Uncle   . Stroke Maternal Uncle   . Leukemia Sister   . Hypertension Sister   . Healthy Son   . Colon polyps Neg Hx    Past Surgical History:  Procedure Laterality Date  . COLONOSCOPY  01/2014   diverticulosis, othwerise normal - Dr. Owens Loffler  . ESOPHAGOGASTRODUODENOSCOPY  2013   Dr. Benson Norway, gastritis  . ESOPHAGOGASTRODUODENOSCOPY  [id number]  . EUS N/A 04/16/2014   Procedure: UPPER ENDOSCOPIC ULTRASOUND (EUS) LINEAR;  Surgeon: Milus Banister, MD;  Location: [facility];  Service: Endoscopy;  Laterality: N/A;  . gall stone surgery    . KNEE ARTHROSCOPY Left   . LAPAROSCOPIC GASTRIC RESECTION N/A 06/09/2014   Procedure: LAPAROSCOPIC GASTRIC MASS RESECTION;  Surgeon: [name], MD;  Location: [facility];  Service: General;  Laterality: N/A;  . LIPOMA EXCISION     forehead  . UTERINE FIBROID SURGERY     Social History   Social History Narrative    Married, has 1 son in [place] and some grandchildren.  Glass blower/designer.  Active on job.  Does stretching and exercises daily as per physical therapy.  Works 12 hours daily.      Objective: Vital Signs: BP 126/82 (BP Location: Right Arm, Patient Position: Sitting, Cuff Size: Large)   Pulse 87   Resp 16   Ht 5\' 9"  (1.753 m)   Wt 217 lb (98.4 kg)   LMP 02/23/2015   BMI 32.05 kg/m    Physical Exam  Constitutional: She is oriented to person, place, and time. She appears well-developed and well-nourished.  HENT:  Head: Normocephalic and atraumatic.  Eyes: Conjunctivae and EOM are normal.  Neck: Normal range of motion.  Cardiovascular: Normal rate, regular rhythm, normal heart sounds and intact distal pulses.  Pulmonary/Chest: Effort normal and breath sounds normal.  Abdominal: Soft. Bowel sounds are normal.  Lymphadenopathy:    She has no cervical adenopathy.  Neurological: She is alert and oriented to person, place, and time.  Skin: Skin is warm and dry. Capillary refill takes less than 2 seconds.  Psychiatric: She has a normal mood and  affect. Her behavior is normal.  Nursing note and vitals reviewed.    Musculoskeletal Exam: C-spine thoracic spine good range of motion.  She has discomfort with range of motion of her lumbar spine which was limited.  Shoulder joints elbow joints wrist joint MCPs were in good range of motion with no synovitis.  She has DIP and PIP thickening of her bilateral hands.  Hip joints knee joints ankles MTPs PIPs were in good range of motion.  She has tenderness across her MCPs, over bilateral knee joints and bilateral ankle joints.  She also had tenderness across her MTPs.  CDAI Exam: CDAI Score: 2.6  Patient Global Assessment: 5 (mm); Provider Global Assessment: 1 (mm) Swollen: 0 ; Tender: 4  Joint Exam      Right  Left  Hip   Tender   Tender  Knee   Tender   Tender     Investigation: No additional findings.  Imaging: [place] Extrem Up Bilat  Comp  Result Date: 11/14/2017 Ultrasound examination of bilateral hands was performed per EULAR recommendations. Using 12 MHz transducer, grayscale and power Doppler bilateral second, third, and fifth MCP joints and bilateral wrist joints both dorsal and volar aspects were evaluated to look for synovitis or tenosynovitis. The findings were there was no synovitis or tenosynovitis on ultrasound examination. Right median nerve was 0.10 cm squares which was normal limits and left median nerve was 0.15 cm squares which was more than upper limits of normal. Impression: Ultrasound examination not show any synovitis.  The left median nerve was enlarged.   Recent Labs: Lab Results  Component Value Date   WBC 5.3 09/12/2016   HGB 12.9 09/12/2016   PLT 375 09/12/2016   NA 141 09/12/2016   K 4.6 09/12/2016   CL 105 09/12/2016   CO2 29 09/12/2016   GLUCOSE 91 09/12/2016   BUN 13 09/12/2016   CREATININE 0.77 09/12/2016   BILITOT 0.5 04/20/2016   ALKPHOS 96 04/20/2016   AST 15 04/20/2016   ALT 13 04/20/2016   PROT 6.9 04/20/2016   ALBUMIN 3.7 04/20/2016   CALCIUM 9.6 09/12/2016   GFRAA >60 09/27/2015    Speciality Comments: No specialty comments available.  Procedures:  No procedures performed Allergies: Kiwi extract; Mucinex dm [dm-guaifenesin er]; and Peach [prunus persica]   Assessment / Plan:     Visit Diagnoses: Primary osteoarthritis of both hands-she has DIP and PIP thickening consistent with osteoarthritis.  I do not see any synovitis.  The ultrasound was also negative for synovitis.  She is having significant discomfort and wants to try medications for rheumatoid arthritis.  Rheumatoid factor positive - RF 31.8, anti-CCP 23.  We discussed possible trial of Plaquenil.  Indications side effects contraindications were discussed.  She wants to proceed with Plaquenil.  Handout was given and consent was taken.  I have advised her to get appropriate immunization.  The plan is to start on  Plaquenil 200 mg p.o. twice daily.  We will check labs in 1 month and every 3 months to monitor for drug toxicity.  She is been also advised to get baseline eye exam and then yearly eye examination to monitor for ocular toxicity.  Patient was counseled on the purpose, proper use, and adverse effects of hydroxychloroquine including nausea/diarrhea, skin rash, headaches, and sun sensitivity.  Discussed importance of annual eye exams while on hydroxychloroquine to monitor to ocular toxicity and discussed importance of frequent laboratory monitoring.  Provided patient with eye exam form for baseline ophthalmologic exam.  Provided patient with educational materials on hydroxychloroquine and answered all questions.  Patient consented to hydroxychloroquine.  Will upload consent in the media tab.    Primary osteoarthritis of both knees - Bilateral moderate with severe chondromalacia patella.  She has chronic pain and discomfort in her knee joints.  She has significant discomfort range of motion.  I did not see any inflammation.  Primary osteoarthritis of both feet - mild.  She complains of swelling in her bilateral ankle joints intermittently.  DDD (degenerative disc disease), lumbar-she has chronic pain and significant discomfort in her lower back.  Essential hypertension-hyper pressure is controlled.  Gastroesophageal reflux disease without esophagitis  History of TIA (transient ischemic attack)  Hyperlipidemia, unspecified hyperlipidemia type  Vitamin D deficiency -she is on vitamin D supplement.  Orders: Orders Placed This Encounter  Procedures  . CBC with Differential/Platelet  . COMPLETE METABOLIC PANEL WITH GFR  . Glucose 6 phosphate dehydrogenase   Meds ordered this encounter  Medications  . hydroxychloroquine (PLAQUENIL) 200 MG tablet    Sig: Take 1 tablet (200 mg total) by mouth 2 (two) times daily.    Dispense:  60 tablet    Refill:  2    Face-to-face time spent with patient was  30 minutes. Greater than 50% of time was spent in counseling and coordination of care.  Follow-Up Instructions: Return in about 3 months (around 03/06/2018) for Rheumatoid arthritis.   Bo Merino, MD  Note - This record has been created using Editor, commissioning.  Chart creation errors have been sought, but may not always  have been located. Such creation errors do not reflect on  the standard of medical care.

## 2017-11-24 ENCOUNTER — Other Ambulatory Visit: Payer: Self-pay | Admitting: Medical

## 2017-11-26 NOTE — Telephone Encounter (Signed)
Is this ok to refill?  

## 2017-11-30 ENCOUNTER — Ambulatory Visit (INDEPENDENT_AMBULATORY_CARE_PROVIDER_SITE_OTHER): Payer: BLUE CROSS/BLUE SHIELD | Admitting: Physical Medicine and Rehabilitation

## 2017-11-30 VITALS — BP 114/73

## 2017-11-30 DIAGNOSIS — M2560 Stiffness of unspecified joint, not elsewhere classified: Secondary | ICD-10-CM

## 2017-11-30 DIAGNOSIS — M7989 Other specified soft tissue disorders: Secondary | ICD-10-CM

## 2017-11-30 DIAGNOSIS — R202 Paresthesia of skin: Secondary | ICD-10-CM

## 2017-11-30 DIAGNOSIS — M79642 Pain in left hand: Secondary | ICD-10-CM

## 2017-11-30 DIAGNOSIS — M79641 Pain in right hand: Secondary | ICD-10-CM | POA: Diagnosis not present

## 2017-11-30 DIAGNOSIS — M2550 Pain in unspecified joint: Secondary | ICD-10-CM

## 2017-11-30 NOTE — Procedures (Signed)
EMG & NCV Findings: Evaluation of the left median (across palm) sensory nerve showed no response (Palm) and prolonged distal peak latency (3.8 ms).  The right median (across palm) sensory nerve showed prolonged distal peak latency (Wrist, 3.9 ms).  All remaining nerves (as indicated in the following tables) were within normal limits.  Left vs. Right side comparison data for the ulnar motor nerve indicates abnormal L-R velocity difference (A Elbow-B Elbow, 18 m/s).  All remaining left vs. right side differences were within normal limits.    All examined muscles (as indicated in the following table) showed no evidence of electrical instability.    Impression: The above electrodiagnostic study is ABNORMAL and reveals evidence of a mild bilateral median nerve entrapment at the wrist (carpal tunnel syndrome) affecting sensory components.  **This does not explain the totality of her symptoms would continue work-up for musculoskeletal pathology.  There is no significant electrodiagnostic evidence of any other focal nerve entrapment, brachial plexopathy, cervical radiculopathy or generalized peripheral neuropathy.   As you know, this particular electrodiagnostic study cannot rule out chemical radiculitis or sensory only radiculopathy.  This electrodiagnostic study cannot rule out small fiber polyneuropathy and dysesthesias from central pain sensitization syndromes such as fibromyalgia.  Myotomal referral pain from trigger points is also not excluded.   Recommendations: 1.  Follow-up with referring physician. 2.  Continue current management of symptoms. 3.  Continue use of resting splint at night-time and as needed during the day.  ___________________________ Wonda Olds Board Certified, American Board of Physical Medicine and Rehabilitation    Nerve Conduction Studies Anti Sensory Summary Table   Stim Site NR Peak (ms) Norm Peak (ms) P-T Amp (V) Norm P-T Amp Site1 Site2 Delta-P (ms) Dist  (cm) Vel (m/s) Norm Vel (m/s)  Left Median Acr Palm Anti Sensory (2nd Digit)  33.1C  Wrist    *3.8 <3.6 20.4 >10 Wrist Palm  0.0    Palm *NR  <2.0          Right Median Acr Palm Anti Sensory (2nd Digit)  33.5C  Wrist    *3.9 <3.6 16.3 >10 Wrist Palm 2.0 0.0    Palm    1.9 <2.0 5.8         Left Radial Anti Sensory (Base 1st Digit)  32.6C  Wrist    2.1 <3.1 28.9  Wrist Base 1st Digit 2.1 0.0    Right Radial Anti Sensory (Base 1st Digit)  32.6C  Wrist    2.3 <3.1 22.7  Wrist Base 1st Digit 2.3 0.0    Left Ulnar Anti Sensory (5th Digit)  33.5C  Wrist    3.4 <3.7 25.9 >15.0 Wrist 5th Digit 3.4 14.0 41 >38  Right Ulnar Anti Sensory (5th Digit)  33.1C  Wrist    3.2 <3.7 15.2 >15.0 Wrist 5th Digit 3.2 14.0 44 >38   Motor Summary Table   Stim Site NR Onset (ms) Norm Onset (ms) O-P Amp (mV) Norm O-P Amp Site1 Site2 Delta-0 (ms) Dist (cm) Vel (m/s) Norm Vel (m/s)  Left Median Motor (Abd Poll Brev)  32.5C  Wrist    4.0 <4.2 8.7 >5 Elbow Wrist 4.6 24.0 52 >50  Elbow    8.6  8.1         Right Median Motor (Abd Poll Brev)  32.2C  Wrist    3.8 <4.2 8.7 >5 Elbow Wrist 4.5 23.0 51 >50  Elbow    8.3  3.8         Left  Ulnar Motor (Abd Dig Min)  32.5C  Wrist    2.7 <4.2 9.9 >3 B Elbow Wrist 3.6 22.5 63 >53  B Elbow    6.3  9.7  A Elbow B Elbow 1.3 9.5 73 >53  A Elbow    7.6  10.0         Right Ulnar Motor (Abd Dig Min)  31.8C  Wrist    2.9 <4.2 11.3 >3 B Elbow Wrist 3.7 23.0 62 >53  B Elbow    6.6  11.4  A Elbow B Elbow 1.1 10.0 91 >53  A Elbow    7.7  11.2          EMG   Side Muscle Nerve Root Ins Act Fibs Psw Amp Dur Poly Recrt Int Fraser Din Comment  Left Abd Poll Brev Median C8-T1 Nml Nml Nml Nml Nml 0 Nml Nml   Left 1stDorInt Ulnar C8-T1 Nml Nml Nml Nml Nml 0 Nml Nml   Left PronatorTeres Median C6-7 Nml Nml Nml Nml Nml 0 Nml Nml   Left Biceps Musculocut C5-6 Nml Nml Nml Nml Nml 0 Nml Nml   Left Deltoid Axillary C5-6 Nml Nml Nml Nml Nml 0 Nml Nml     Nerve Conduction Studies Anti  Sensory Left/Right Comparison   Stim Site L Lat (ms) R Lat (ms) L-R Lat (ms) L Amp (V) R Amp (V) L-R Amp (%) Site1 Site2 L Vel (m/s) R Vel (m/s) L-R Vel (m/s)  Median Acr Palm Anti Sensory (2nd Digit)  33.1C  Wrist *3.8 *3.9 0.1 20.4 16.3 20.1 Wrist Palm     Palm  1.9   5.8        Radial Anti Sensory (Base 1st Digit)  32.6C  Wrist 2.1 2.3 0.2 28.9 22.7 21.5 Wrist Base 1st Digit     Ulnar Anti Sensory (5th Digit)  33.5C  Wrist 3.4 3.2 0.2 25.9 15.2 41.3 Wrist 5th Digit 41 44 3   Motor Left/Right Comparison   Stim Site L Lat (ms) R Lat (ms) L-R Lat (ms) L Amp (mV) R Amp (mV) L-R Amp (%) Site1 Site2 L Vel (m/s) R Vel (m/s) L-R Vel (m/s)  Median Motor (Abd Poll Brev)  32.5C  Wrist 4.0 3.8 0.2 8.7 8.7 0.0 Elbow Wrist 52 51 1  Elbow 8.6 8.3 0.3 8.1 3.8 53.1       Ulnar Motor (Abd Dig Min)  32.5C  Wrist 2.7 2.9 0.2 9.9 11.3 12.4 B Elbow Wrist 63 62 1  B Elbow 6.3 6.6 0.3 9.7 11.4 14.9 A Elbow B Elbow 73 91 *18  A Elbow 7.6 7.7 0.1 10.0 11.2 10.7          Waveforms:

## 2017-12-04 ENCOUNTER — Encounter (INDEPENDENT_AMBULATORY_CARE_PROVIDER_SITE_OTHER): Payer: Self-pay | Admitting: Physical Medicine and Rehabilitation

## 2017-12-04 NOTE — Progress Notes (Signed)
Lisa Briggs - 57 y.o. female MRN [id number]  Date of birth: 1960/10/04  Office Visit Note: Visit Date: 11/30/2017 PCP: Carlena Hurl, PA-C Referred by: Carlena Hurl, PA-C  Subjective: Chief Complaint  Patient presents with  . Right Hand - Pain  . Left Hand - Fracture   HPI: [name] is a 57 year old right-hand-dominant female who comes in today with history of chronic bilateral hand pain right slightly worse than the left.  She reports 2 to 3 years of chronic progressive worsening pain in both hands with soreness and aching in both hands particularly along the thumb region.  She reports a lot of swelling in the hands.  She does endorse numbness and tingling more in the radial digits.  She does get more numbness at night and nocturnal complaints.  She has recently seen Dr. Cy Blamer who kindly sends her here for evaluation management.  Ultrasound testing of both median nerves showed increased diameter on the left but not the right.  She has used her hands extensively in the past.  She denies any history of polyneuropathy or diabetes.  She has carried a diagnosis of impaired fasting glucose.  Her biggest complaint is polyarthralgia and multiple joint pain and stiffness.  She has a history of chronic back pain as well.  She has not had prior electrodiagnostic studies.  She denies any radicular type symptoms down the arms.  She has had no specific injury to the hands.  Swelling has been generalized.  Dr. Estanislado Pandy did not see any evidence of synovitis.   Review of Systems  Constitutional: Positive for malaise/fatigue. Negative for chills, fever and weight loss.  HENT: Negative for hearing loss and sinus pain.   Eyes: Negative for blurred vision, double vision and photophobia.  Respiratory: Negative for cough and shortness of breath.   Cardiovascular: Negative for chest pain, palpitations and leg swelling.  Gastrointestinal: Negative for abdominal pain, nausea and vomiting.    Genitourinary: Negative for flank pain.  Musculoskeletal: Positive for back pain and joint pain. Negative for myalgias.  Skin: Negative for itching and rash.  Neurological: Positive for tingling. Negative for tremors, focal weakness and weakness.  Endo/Heme/Allergies: Negative.   Psychiatric/Behavioral: Negative for depression.  All other systems reviewed and are negative.  Otherwise per HPI.  Assessment & Plan: Visit Diagnoses:  1. Paresthesia of skin   2. Bilateral hand pain   3. Swelling of both hands   4. Joint stiffness   5. Polyarthralgia     Plan: Impression: The above electrodiagnostic study is ABNORMAL and reveals evidence of a mild bilateral median nerve entrapment at the wrist (carpal tunnel syndrome) affecting sensory components.  **This does not explain the totality of her symptoms would continue work-up for musculoskeletal pathology.  There is no significant electrodiagnostic evidence of any other focal nerve entrapment, brachial plexopathy, cervical radiculopathy or generalized peripheral neuropathy.   As you know, this particular electrodiagnostic study cannot rule out chemical radiculitis or sensory only radiculopathy.  This electrodiagnostic study cannot rule out small fiber polyneuropathy and dysesthesias from central pain sensitization syndromes such as fibromyalgia.  Myotomal referral pain from trigger points is also not excluded.   Recommendations: 1.  Follow-up with referring physician. 2.  Continue current management of symptoms. 3.  Continue use of resting splint at night-time and as needed during the day.    Meds & Orders: No orders of the defined types were placed in this encounter.   Orders Placed This Encounter  Procedures  .  NCV with EMG (electromyography)    Follow-up: Return for Shali Deveshwar,MD.   Procedures: No procedures performed  EMG & NCV Findings: Evaluation of the left median (across palm) sensory nerve showed no response (Palm)  and prolonged distal peak latency (3.8 ms).  The right median (across palm) sensory nerve showed prolonged distal peak latency (Wrist, 3.9 ms).  All remaining nerves (as indicated in the following tables) were within normal limits.  Left vs. Right side comparison data for the ulnar motor nerve indicates abnormal L-R velocity difference (A Elbow-B Elbow, 18 m/s).  All remaining left vs. right side differences were within normal limits.    All examined muscles (as indicated in the following table) showed no evidence of electrical instability.    Impression: The above electrodiagnostic study is ABNORMAL and reveals evidence of a mild bilateral median nerve entrapment at the wrist (carpal tunnel syndrome) affecting sensory components.  **This does not explain the totality of her symptoms would continue work-up for musculoskeletal pathology.  There is no significant electrodiagnostic evidence of any other focal nerve entrapment, brachial plexopathy, cervical radiculopathy or generalized peripheral neuropathy.   As you know, this particular electrodiagnostic study cannot rule out chemical radiculitis or sensory only radiculopathy.  This electrodiagnostic study cannot rule out small fiber polyneuropathy and dysesthesias from central pain sensitization syndromes such as fibromyalgia.  Myotomal referral pain from trigger points is also not excluded.   Recommendations: 1.  Follow-up with referring physician. 2.  Continue current management of symptoms. 3.  Continue use of resting splint at night-time and as needed during the day.  ___________________________ Lisa Briggs Board Certified, American Board of Physical Medicine and Rehabilitation    Nerve Conduction Studies Anti Sensory Summary Table   Stim Site NR Peak (ms) Norm Peak (ms) P-T Amp (V) Norm P-T Amp Site1 Site2 Delta-P (ms) Dist (cm) Vel (m/s) Norm Vel (m/s)  Left Median Acr Palm Anti Sensory (2nd Digit)  33.1C  Wrist    *3.8  <3.6 20.4 >10 Wrist Palm  0.0    Palm *NR  <2.0          Right Median Acr Palm Anti Sensory (2nd Digit)  33.5C  Wrist    *3.9 <3.6 16.3 >10 Wrist Palm 2.0 0.0    Palm    1.9 <2.0 5.8         Left Radial Anti Sensory (Base 1st Digit)  32.6C  Wrist    2.1 <3.1 28.9  Wrist Base 1st Digit 2.1 0.0    Right Radial Anti Sensory (Base 1st Digit)  32.6C  Wrist    2.3 <3.1 22.7  Wrist Base 1st Digit 2.3 0.0    Left Ulnar Anti Sensory (5th Digit)  33.5C  Wrist    3.4 <3.7 25.9 >15.0 Wrist 5th Digit 3.4 14.0 41 >38  Right Ulnar Anti Sensory (5th Digit)  33.1C  Wrist    3.2 <3.7 15.2 >15.0 Wrist 5th Digit 3.2 14.0 44 >38   Motor Summary Table   Stim Site NR Onset (ms) Norm Onset (ms) O-P Amp (mV) Norm O-P Amp Site1 Site2 Delta-0 (ms) Dist (cm) Vel (m/s) Norm Vel (m/s)  Left Median Motor (Abd Poll Brev)  32.5C  Wrist    4.0 <4.2 8.7 >5 Elbow Wrist 4.6 24.0 52 >50  Elbow    8.6  8.1         Right Median Motor (Abd Poll Brev)  32.2C  Wrist    3.8 <4.2 8.7 >5 Elbow Wrist 4.5  23.0 51 >50  Elbow    8.3  3.8         Left Ulnar Motor (Abd Dig Min)  32.5C  Wrist    2.7 <4.2 9.9 >3 B Elbow Wrist 3.6 22.5 63 >53  B Elbow    6.3  9.7  A Elbow B Elbow 1.3 9.5 73 >53  A Elbow    7.6  10.0         Right Ulnar Motor (Abd Dig Min)  31.8C  Wrist    2.9 <4.2 11.3 >3 B Elbow Wrist 3.7 23.0 62 >53  B Elbow    6.6  11.4  A Elbow B Elbow 1.1 10.0 91 >53  A Elbow    7.7  11.2          EMG   Side Muscle Nerve Root Ins Act Fibs Psw Amp Dur Poly Recrt Int Fraser Din Comment  Left Abd Poll Brev Median C8-T1 Nml Nml Nml Nml Nml 0 Nml Nml   Left 1stDorInt Ulnar C8-T1 Nml Nml Nml Nml Nml 0 Nml Nml   Left PronatorTeres Median C6-7 Nml Nml Nml Nml Nml 0 Nml Nml   Left Biceps Musculocut C5-6 Nml Nml Nml Nml Nml 0 Nml Nml   Left Deltoid Axillary C5-6 Nml Nml Nml Nml Nml 0 Nml Nml     Nerve Conduction Studies Anti Sensory Left/Right Comparison   Stim Site L Lat (ms) R Lat (ms) L-R Lat (ms) L Amp (V) R Amp (V) L-R  Amp (%) Site1 Site2 L Vel (m/s) R Vel (m/s) L-R Vel (m/s)  Median Acr Palm Anti Sensory (2nd Digit)  33.1C  Wrist *3.8 *3.9 0.1 20.4 16.3 20.1 Wrist Palm     Palm  1.9   5.8        Radial Anti Sensory (Base 1st Digit)  32.6C  Wrist 2.1 2.3 0.2 28.9 22.7 21.5 Wrist Base 1st Digit     Ulnar Anti Sensory (5th Digit)  33.5C  Wrist 3.4 3.2 0.2 25.9 15.2 41.3 Wrist 5th Digit 41 44 3   Motor Left/Right Comparison   Stim Site L Lat (ms) R Lat (ms) L-R Lat (ms) L Amp (mV) R Amp (mV) L-R Amp (%) Site1 Site2 L Vel (m/s) R Vel (m/s) L-R Vel (m/s)  Median Motor (Abd Poll Brev)  32.5C  Wrist 4.0 3.8 0.2 8.7 8.7 0.0 Elbow Wrist 52 51 1  Elbow 8.6 8.3 0.3 8.1 3.8 53.1       Ulnar Motor (Abd Dig Min)  32.5C  Wrist 2.7 2.9 0.2 9.9 11.3 12.4 B Elbow Wrist 63 62 1  B Elbow 6.3 6.6 0.3 9.7 11.4 14.9 A Elbow B Elbow 73 91 *18  A Elbow 7.6 7.7 0.1 10.0 11.2 10.7          Waveforms:                     Clinical History: 11/14/17 [place] of bilateral Hands - Deveshwar  Right median nerve was 0.10 cm squares which was normal limits and left median nerve was 0.15 cm squares which was more than upper limits of normal.  Impression: Ultrasound examination not show any synovitis.  The left median nerve was enlarged.   She reports that she has never smoked. She has never used smokeless tobacco.  Recent Labs    08/13/17 1508  HGBA1C 5.8*  LABURIC 3.4    Objective:  VS:  HT:    WT:   BMI:  BP:114/73  HR: bpm  TEMP: ( )  RESP:  Physical Exam  Constitutional: She is oriented to person, place, and time. She appears well-developed and well-nourished.  Obese  Eyes: Pupils are equal, round, and reactive to light. Conjunctivae and EOM are normal.  Neck: Neck supple. No JVD present. No tracheal deviation present.  Cardiovascular: Normal rate and intact distal pulses.  Pulmonary/Chest: Effort normal.  Musculoskeletal:  Inspection reveals no atrophy of the bilateral APB or FDI or hand  intrinsics. There is no swelling, color changes, allodynia or dystrophic changes. There is 5 out of 5 strength in the bilateral wrist extension, finger abduction and long finger flexion. There is intact sensation to light touch in all dermatomal and peripheral nerve distributions.  There is a equivocally positive Phalen's test bilaterally. There is a negative Hoffmann's test bilaterally.  Neurological: She is alert and oriented to person, place, and time. No sensory deficit. She exhibits normal muscle tone. Coordination normal.  Skin: Skin is warm and dry. No rash noted. No erythema.  Psychiatric: She has a normal mood and affect. Her behavior is normal.  Nursing note and vitals reviewed.   Ortho Exam Imaging: No results found.  Past Medical/Family/Surgical/Social History: Medications & Allergies reviewed per EMR, new medications updated. Patient Active Problem List   Diagnosis Date Noted  . Primary osteoarthritis of both hands 11/23/2017  . Rheumatoid factor positive 11/23/2017  . Primary osteoarthritis of both knees 11/23/2017  . Primary osteoarthritis of both feet 11/23/2017  . DDD (degenerative disc disease), lumbar 11/23/2017  . Vaccine counseling 08/13/2017  . Polyarthralgia 08/13/2017  . Joint stiffness 08/13/2017  . Sensitive skin 01/23/2017  . Need for influenza vaccination 01/23/2017  . Proteinuria 01/23/2017  . History of gastrointestinal stromal tumor (GIST) 04/20/2016  . History of TIA (transient ischemic attack) 04/20/2016  . Screening for breast cancer 04/20/2016  . Estrogen deficiency 04/20/2016  . Chronic maxillary sinusitis 04/20/2016  . Impaired fasting blood sugar 04/20/2016  . Constipation 04/20/2016  . History of fall 04/20/2016  . Chronic radicular lumbar pain 01/27/2016  . Encounter for health maintenance examination in adult 03/22/2015  . Facial paresthesia 03/22/2015  . Cognitive decline 03/22/2015  . Screening for cervical cancer 03/22/2015  . Vitamin  D deficiency 03/22/2015  . History of uterine leiomyoma 03/22/2015  . Gastroesophageal reflux disease without esophagitis 03/02/2014  . Chronic nausea 03/02/2014  . Chronic abdominal pain 03/02/2014  . Rhinitis, allergic 03/02/2014  . Essential hypertension 03/02/2014  . Hyperlipidemia 03/02/2014  . Class 1 obesity with serious comorbidity in adult 01/16/2012   Past Medical History:  Diagnosis Date  . Allergy   . Anemia    iron therapy for years as of 10/12; normal Hgb 08/2013  . Arthritis   . Chest pain 04/05/2011   cardiac eval, normal treadmill stress test, Dr. Tollie Eth  . Chronic back pain   . Constipation   . Farsightedness    wears glasses, [facility] care center  . Gastrointestinal stromal tumor (GIST) ([place]) 06/2014   Dr. [name], [facility]  . GERD (gastroesophageal reflux disease)   . History of uterine fibroid   . Hyperlipidemia   . Hypertension   . Paresthesia 09/2014   initially thought to be TIA, neurology consult in 12/2014 with other non TIA considerations.    . Polyarthralgia    normal rheumatoid screen 01/2012   Family History  Problem Relation Age of Onset  . Hypertension Mother   . Arthritis Mother   .  GER disease Mother   . Glaucoma Mother   . Breast cancer Mother   . Stroke Father   . Breast cancer Sister        breast cancer dx late 16s  . Hypertension Sister   . Colon cancer Brother 55  . Diabetes Brother   . Diabetes Maternal Aunt   . Heart disease Maternal Aunt   . Heart disease Maternal Grandmother   . Heart disease Maternal Grandfather   . Heart disease Maternal Uncle   . Stroke Maternal Uncle   . Leukemia Sister   . Hypertension Sister   . Healthy Son   . Colon polyps Neg Hx    Past Surgical History:  Procedure Laterality Date  . COLONOSCOPY  01/2014   diverticulosis, othwerise normal - Dr. Owens Loffler  . ESOPHAGOGASTRODUODENOSCOPY  2013   Dr. Benson [place], gastritis  . ESOPHAGOGASTRODUODENOSCOPY  [id number]  . EUS N/A  04/16/2014   Procedure: UPPER ENDOSCOPIC ULTRASOUND (EUS) LINEAR;  Surgeon: Milus Banister, MD;  Location: [facility] END[facility];  Service: Endoscopy;  Laterality: N/A;  . gall stone surgery    . KNEE ARTHROSCOPY Left   . LAPAROSCOPIC GASTRIC RESECTION N/A 06/09/2014   Procedure: LAPAROSCOPIC GASTRIC MASS RESECTION;  Surgeon: [name], MD;  Location: [facility] ORS;  Service: General;  Laterality: N/A;  . LIPOMA EXCISION     forehead  . UTERINE FIBROID SURGERY     Social History   Occupational History  . Occupation: IT sales professional: [employer]  Tobacco Use  . Smoking status: Never Smoker  . Smokeless tobacco: Never Used  Substance and Sexual Activity  . Alcohol use: No    Alcohol/week: 0.0 standard drinks  . Drug use: No  . Sexual activity: Not on file

## 2017-12-05 ENCOUNTER — Encounter: Payer: Self-pay | Admitting: Medical

## 2017-12-05 ENCOUNTER — Encounter: Payer: Self-pay | Admitting: Rheumatology

## 2017-12-05 ENCOUNTER — Ambulatory Visit (INDEPENDENT_AMBULATORY_CARE_PROVIDER_SITE_OTHER): Payer: BLUE CROSS/BLUE SHIELD | Admitting: Medical

## 2017-12-05 ENCOUNTER — Ambulatory Visit (INDEPENDENT_AMBULATORY_CARE_PROVIDER_SITE_OTHER): Payer: BLUE CROSS/BLUE SHIELD | Admitting: Rheumatology

## 2017-12-05 ENCOUNTER — Ambulatory Visit (HOSPITAL_COMMUNITY)
Admission: RE | Admit: 2017-12-05 | Discharge: 2017-12-05 | Disposition: A | Discharge status: Home / Self Care | Payer: BLUE CROSS/BLUE SHIELD | Source: Ambulatory Visit | Attending: Medical | Admitting: Medical

## 2017-12-05 VITALS — BP 126/82 | HR 87 | Resp 16 | Ht 69.0 in | Wt 217.0 lb

## 2017-12-05 VITALS — BP 130/80 | HR 86 | Temp 97.9°F | Resp 16 | Ht 68.0 in | Wt 216.4 lb

## 2017-12-05 DIAGNOSIS — M7989 Other specified soft tissue disorders: Secondary | ICD-10-CM | POA: Insufficient documentation

## 2017-12-05 DIAGNOSIS — M2550 Pain in unspecified joint: Secondary | ICD-10-CM

## 2017-12-05 DIAGNOSIS — R202 Paresthesia of skin: Secondary | ICD-10-CM

## 2017-12-05 DIAGNOSIS — R768 Other specified abnormal immunological findings in serum: Secondary | ICD-10-CM

## 2017-12-05 DIAGNOSIS — M19042 Primary osteoarthritis, left hand: Secondary | ICD-10-CM

## 2017-12-05 DIAGNOSIS — Z23 Encounter for immunization: Secondary | ICD-10-CM | POA: Diagnosis not present

## 2017-12-05 DIAGNOSIS — Z8673 Personal history of transient ischemic attack (TIA), and cerebral infarction without residual deficits: Secondary | ICD-10-CM

## 2017-12-05 DIAGNOSIS — Z79899 Other long term (current) drug therapy: Secondary | ICD-10-CM

## 2017-12-05 DIAGNOSIS — R109 Unspecified abdominal pain: Secondary | ICD-10-CM

## 2017-12-05 DIAGNOSIS — M19072 Primary osteoarthritis, left ankle and foot: Secondary | ICD-10-CM

## 2017-12-05 DIAGNOSIS — E785 Hyperlipidemia, unspecified: Secondary | ICD-10-CM

## 2017-12-05 DIAGNOSIS — M19071 Primary osteoarthritis, right ankle and foot: Secondary | ICD-10-CM | POA: Diagnosis not present

## 2017-12-05 DIAGNOSIS — I10 Essential (primary) hypertension: Secondary | ICD-10-CM | POA: Diagnosis not present

## 2017-12-05 DIAGNOSIS — G8929 Other chronic pain: Secondary | ICD-10-CM

## 2017-12-05 DIAGNOSIS — K219 Gastro-esophageal reflux disease without esophagitis: Secondary | ICD-10-CM

## 2017-12-05 DIAGNOSIS — M5136 Other intervertebral disc degeneration, lumbar region: Secondary | ICD-10-CM

## 2017-12-05 DIAGNOSIS — M170 Bilateral primary osteoarthritis of knee: Secondary | ICD-10-CM | POA: Diagnosis not present

## 2017-12-05 DIAGNOSIS — M19041 Primary osteoarthritis, right hand: Secondary | ICD-10-CM | POA: Diagnosis not present

## 2017-12-05 DIAGNOSIS — M5416 Radiculopathy, lumbar region: Secondary | ICD-10-CM

## 2017-12-05 DIAGNOSIS — E559 Vitamin D deficiency, unspecified: Secondary | ICD-10-CM

## 2017-12-05 MED ORDER — HYDROXYCHLOROQUINE SULFATE 200 MG PO TABS: 200.0000 mg | ORAL_TABLET | Freq: Two times a day (BID) | ORAL | 2 refills | Status: DC

## 2017-12-05 NOTE — Addendum Note (Signed)
Addended by: Edgar Frisk on: 12/05/2017 12:42 PM   Modules accepted: Orders

## 2017-12-05 NOTE — Progress Notes (Signed)
*  Preliminary Results* Left lower extremity venous duplex completed. Left lower extremity is negative for deep vein thrombosis. There is no evidence of left Baker's cyst.  12/05/2017 3:29 PM  Lisa Briggs Dawna Part

## 2017-12-05 NOTE — Patient Instructions (Addendum)
Standing Labs We placed an order today for your standing lab work.    Please come back and get your standing labs in 31month and every 3 months  We have open lab Monday through Friday from 8:30-11:30 AM and 1:30-4:00 PM  at the office of Dr. Bo Merino.   You may experience shorter wait times on Monday and Friday afternoons. The office is located at [address] [place], [place], [place] [address] No appointment is necessary.   Labs are drawn by [facility].  You may receive a bill from [facility] for your lab work. If you have any questions regarding directions or hours of operation,  please call [phone].       Hydroxychloroquine tablets What is this medicine? HYDROXYCHLOROQUINE (hye drox ee KLOR oh kwin) is used to treat rheumatoid arthritis and systemic lupus erythematosus. It is also used to treat malaria. This medicine may be used for other purposes; ask your health care provider or pharmacist if you have questions. COMMON BRAND NAME(S): Plaquenil, Quineprox What should I tell my health care provider before I take this medicine? They need to know if you have any of these conditions: -diabetes -eye disease, vision problems -G6PD deficiency -history of blood diseases -history of irregular heartbeat -if you often drink alcohol -kidney disease -liver disease -porphyria -psoriasis -seizures -an unusual or allergic reaction to chloroquine, hydroxychloroquine, other medicines, foods, dyes, or preservatives -pregnant or trying to get pregnant -breast-feeding How should I use this medicine? Take this medicine by mouth with a glass of water. Follow the directions on the prescription label. Avoid taking antacids within 4 hours of taking this medicine. It is best to separate these medicines by at least 4 hours. Do not cut, crush or chew this medicine. You can take it with or without food. If it upsets your stomach, take it with food. Take your medicine at regular intervals. Do  not take your medicine more often than directed. Take all of your medicine as directed even if you think you are better. Do not skip doses or stop your medicine early. Talk to your pediatrician regarding the use of this medicine in children. While this drug may be prescribed for selected conditions, precautions do apply. Overdosage: If you think you have taken too much of this medicine contact a poison control center or emergency room at once. NOTE: This medicine is only for you. Do not share this medicine with others. What if I miss a dose? If you miss a dose, take it as soon as you can. If it is almost time for your next dose, take only that dose. Do not take double or extra doses. What may interact with this medicine? Do not take this medicine with any of the following medications: -cisapride -dofetilide -dronedarone -live virus vaccines -penicillamine -pimozide -thioridazine -ziprasidone This medicine may also interact with the following medications: -ampicillin -antacids -cimetidine -cyclosporine -digoxin -medicines for diabetes, like insulin, glipizide, glyburide -medicines for seizures like carbamazepine, phenobarbital, phenytoin -mefloquine -methotrexate -other medicines that prolong the QT interval (cause an abnormal heart rhythm) -praziquantel This list may not describe all possible interactions. Give your health care provider a list of all the medicines, herbs, non-prescription drugs, or dietary supplements you use. Also tell them if you smoke, drink alcohol, or use illegal drugs. Some items may interact with your medicine. What should I watch for while using this medicine? Tell your doctor or healthcare professional if your symptoms do not start to get better or if they get worse. Avoid  taking antacids within 4 hours of taking this medicine. It is best to separate these medicines by at least 4 hours. Tell your doctor or health care professional right away if you have any  change in your eyesight. Your vision and blood may be tested before and during use of this medicine. This medicine can make you more sensitive to the sun. Keep out of the sun. If you cannot avoid being in the sun, wear protective clothing and use sunscreen. Do not use sun lamps or tanning beds/booths. What side effects may I notice from receiving this medicine? Side effects that you should report to your doctor or health care professional as soon as possible: -allergic reactions like skin rash, itching or hives, swelling of the face, lips, or tongue -changes in vision -decreased hearing or ringing of the ears -redness, blistering, peeling or loosening of the skin, including inside the mouth -seizures -sensitivity to light -signs and symptoms of a dangerous change in heartbeat or heart rhythm like chest pain; dizziness; fast or irregular heartbeat; palpitations; feeling faint or lightheaded, falls; breathing problems -signs and symptoms of liver injury like dark yellow or brown urine; general ill feeling or flu-like symptoms; light-colored stools; loss of appetite; nausea; right upper belly pain; unusually weak or tired; yellowing of the eyes or skin -signs and symptoms of low blood sugar such as feeling anxious; confusion; dizziness; increased hunger; unusually weak or tired; sweating; shakiness; cold; irritable; headache; blurred vision; fast heartbeat; loss of consciousness -uncontrollable head, mouth, neck, arm, or leg movements Side effects that usually do not require medical attention (report to your doctor or health care professional if they continue or are bothersome): -anxious -diarrhea -dizziness -hair loss -headache -irritable -loss of appetite -nausea, vomiting -stomach pain This list may not describe all possible side effects. Call your doctor for medical advice about side effects. You may report side effects to FDA at 1-800-FDA-1088. Where should I keep my medicine? Keep out  of the reach of children. In children, this medicine can cause overdose with small doses. Store at room temperature between 15 and 30 degrees C (59 and 86 degrees F). Protect from moisture and light. Throw away any unused medicine after the expiration date. NOTE: This sheet is a summary. It may not cover all possible information. If you have questions about this medicine, talk to your doctor, pharmacist, or health care provider.  2018 Elsevier/Gold Standard (2015-11-03 14:16:15)  Please get Pneumococcal vaccine, Flu vaccine and Shingrix vaccine

## 2017-12-05 NOTE — Progress Notes (Addendum)
Subjective: Chief Complaint  Patient presents with  . follow up    hospital follow up    Here for hospital f/u.  She has past medical history significant for HTN, hyperlipidemia, TIA symptom in past few years, anemia, polyarthralgia, hx/o stromal gastric tumor, chronic back and abdomina pain, chronic nausea, here for hospital ED f/u.  She reports being seen 11/02/17 in [facility] Emergency Dept.   She notes she had passed out after speaking at her best friend's funeral.   Right hand was shaky.  She notes having head scan, labs, several tests.  She was released same day.  She notes that the ED doctor thought it was related to anxiety.    She was checked for stroke and heart attack which was reportedly negative.  She denies any similar symptoms of syncope since the visit.   No current chest pain, no SOB, no DOE, no edema.   Has felt tingling in left knee, but no other numbness or tingling.   Was given medication while there but no discharge medications.   Was advised to f/u with PCP not cardiology or other.    She does note left calve tenderness and swelling in last week or so.   No color changes, no trauma, but has had long travel recently.  Still having back pains and left knee and leg pains.    Saw Dr. Estanislado Pandy for polyarthralgia 10/24/17.  Ended up having ultrasound and nerve conduction testing on hands 11/30/17.   Has f/u planned with rheumatology     No new c/o    Past Medical History:  Diagnosis Date  . Allergy   . Anemia    iron therapy for years as of 10/12; normal Hgb 08/2013  . Arthritis   . Chest pain 04/05/2011   cardiac eval, normal treadmill stress test, Dr. Tollie Eth  . Chronic back pain   . Constipation   . Farsightedness    wears glasses, [facility]  . 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 [name], [facility]  . GERD (gastroesophageal reflux disease)   . History of uterine fibroid   . Hyperlipidemia   .  Hypertension   . Paresthesia 09/2014   initially thought to be TIA, neurology consult in 12/2014 with other non TIA considerations.    . Polyarthralgia    normal rheumatoid screen 01/2012    Current Outpatient Medications on File Prior to Visit  Medication Sig Dispense Refill  . aspirin EC 81 MG tablet Take 1 tablet (81 mg total) by mouth daily. 90 tablet 3  . cholecalciferol (VITAMIN D) 1000 units tablet Take 1 tablet (1,000 Units total) by mouth daily. 90 tablet 3  . cyclobenzaprine (FLEXERIL) 10 MG tablet Take 1 tablet (10 mg total) by mouth 2 (two) times daily as needed for muscle spasms. 20 tablet 0  . dexlansoprazole (DEXILANT) 60 MG capsule Take 1 capsule (60 mg total) by mouth daily. 90 capsule 3  . EQ ALLERGY RELIEF 10 MG tablet TAKE ONE TABLET BY MOUTH ONCE DAILY 30 tablet 11  . famotidine (PEPCID) 20 MG tablet Take 1 tablet (20 mg total) by mouth at bedtime. 90 tablet 3  . fluticasone (FLONASE) 50 MCG/ACT nasal spray USE ONE SPRAY(S) IN EACH NOSTRIL DAILY AS NEEDED FOR ALLERGIES OR RHINITIS. 16 g 11  . hydrocortisone (PROCTOCORT) 1 % CREA Apply 1 application topically daily. 45 g 1  . losartan-hydrochlorothiazide (HYZAAR) 50-12.5 MG tablet Take 1 tablet by mouth daily.  90 tablet 3  . meloxicam (MOBIC) 7.5 MG tablet Take 1 tablet (7.5 mg total) by mouth daily. 30 tablet 1  . ondansetron (ZOFRAN) 4 MG tablet TAKE ONE TABLET BY MOUTH EVERY 8 HOURS AS NEEDED FOR  NAUSEA  OR  VOMITING 20 tablet 2  . polyethylene glycol powder (GLYCOLAX/MIRALAX) powder TAKE ONE CAPFUL (17 GRAMS) BY MOUTH DAILY. 850 g 3  . potassium chloride (K-DUR,KLOR-CON) 10 MEQ tablet Take 1 tablet (10 mEq total) by mouth daily. 90 tablet 3  . simvastatin (ZOCOR) 20 MG tablet Take 1 tablet (20 mg total) by mouth daily. 90 tablet 3  . venlafaxine (EFFEXOR) 37.5 MG tablet Take 1 tablet (37.5 mg total) by mouth daily. 90 tablet 1  . Vitamin D, Ergocalciferol, (DRISDOL) 50000 units CAPS capsule Take 1 capsule (50,000 Units  total) by mouth every 7 (seven) days. DISCONTINUE THE 50K WEEKLY DOSE, she is taing the 1000u daily dose 1 capsule 0  . gabapentin (NEURONTIN) 300 MG capsule TAKE ONE TABLET BY MOUTH IN THE AFTERNOON AND 2 AT BEDTIME 270 capsule 3   No current facility-administered medications on file prior to visit.    ROS as in subjective   Objective: BP 130/80   Pulse 86   Temp 97.9 F (36.6 C) (Oral)   Resp 16   Ht 5\' 8"  (1.727 m)   Wt 216 lb 6.4 oz (98.2 kg)   LMP 02/23/2015   SpO2 95%   BMI 32.90 kg/m   General appearance: alert, no distress, WD/WN,  HEENT: normocephalic, sclerae anicteric, PERRLA, EOMi, nares patent, no discharge or erythema, pharynx normal Oral cavity: MMM, no lesions Neck: supple, no lymphadenopathy, fullness of thyroid, but no nodules, no masses Heart: RRR, normal S1, S2, no murmurs Lungs: CTA bilaterally, no wheezes, rhonchi, or rales Abdomen: +bs, soft, non tender, non distended, no masses, no hepatomegaly, no splenomegaly Musculoskeletal: left calve tender otherwise, legs nontender, no swelling, no obvious deformity Extremities: mild asymmetry of left calve compared to right, mild tenderness of the left calve, no cyanosis, no clubbing Pulses: 2+ symmetric, upper and lower extremities, normal cap refill Neurological: alert, oriented x 3, CN2-12 intact, strength normal upper extremities and lower extremities, sensation normal throughout, DTRs 2+ throughout, no cerebellar signs, gait normal Psychiatric: normal affect, behavior normal, pleasant      Assessment: Encounter Diagnoses  Name Primary?  . Essential hypertension Yes  . Leg swelling   . Chronic abdominal pain   . History of TIA (transient ischemic attack)   . Hyperlipidemia, unspecified hyperlipidemia type   . Polyarthralgia   . Chronic radicular lumbar pain   . Paresthesia   . Need for influenza vaccination   . Need for shingles vaccine      Plan: We will request records from emergency dept from  [place] as I don't have these records.  I reviewed most recent labs in our EMR from 08/2017, most recent EKG and echo from 2016.  She is current asymptomatic since the single syncope episode that likely was vasovagal at the funeral.   Continue current medications Will schedule for ultrasound to rule out DVT left leg given asymmetry and recent long travel. Exercise, eat a healthy low fat diet, hydrate well daily Polyarthralgia, paresthesia of hands - reviewed recent rheumatology notes, f/u with rheumatology as planned today She is 1 day early on shingrix #2.  She will return for this. Counseled on the influenza virus vaccine.  Vaccine information sheet given.  Influenza vaccine given after consent obtained.  Addendum:  Reviewed hospital report from 11/03/17 hospital visit.  Reviewed CBC with diff, Pt/INR, CMET, cardiac panel,   Reviewed CT brain - normal  NIH stroke scale score of zero  Assessment was acute situation anxiety  Potassium was slightly low, chloride slightly elevated, troponin's normal  Kashawna was seen today for follow up.  Diagnoses and all orders for this visit:  Essential hypertension  Leg swelling -     US Venous Img Lower Unilateral Left; Future  Chronic abdominal pain  History of TIA (transient ischemic attack)  Hyperlipidemia, unspecified hyperlipidemia type  Polyarthralgia  Chronic radicular lumbar pain  Paresthesia  Need for influenza vaccination  Need for shingles vaccine

## 2017-12-05 NOTE — Addendum Note (Signed)
Addended by: Edgar Frisk on: 12/05/2017 12:15 PM   Modules accepted: Orders

## 2017-12-06 ENCOUNTER — Other Ambulatory Visit: Payer: Self-pay | Admitting: Medical

## 2017-12-06 DIAGNOSIS — R102 Pelvic and perineal pain: Secondary | ICD-10-CM

## 2017-12-06 DIAGNOSIS — M7989 Other specified soft tissue disorders: Secondary | ICD-10-CM

## 2017-12-06 DIAGNOSIS — G8929 Other chronic pain: Secondary | ICD-10-CM

## 2017-12-07 ENCOUNTER — Other Ambulatory Visit (INDEPENDENT_AMBULATORY_CARE_PROVIDER_SITE_OTHER): Payer: BLUE CROSS/BLUE SHIELD

## 2017-12-07 DIAGNOSIS — Z23 Encounter for immunization: Secondary | ICD-10-CM | POA: Diagnosis not present

## 2017-12-13 ENCOUNTER — Telehealth: Payer: Self-pay

## 2017-12-13 NOTE — Telephone Encounter (Signed)
Left message for patient to call back.  I called to check on her ultrasound appointment and [facility] states that she cancelled appointment.

## 2017-12-17 ENCOUNTER — Ambulatory Visit (INDEPENDENT_AMBULATORY_CARE_PROVIDER_SITE_OTHER): Payer: BLUE CROSS/BLUE SHIELD | Admitting: Medical

## 2017-12-17 VITALS — BP 120/76 | HR 86 | Temp 97.8°F | Resp 16 | Ht 69.0 in | Wt 220.2 lb

## 2017-12-17 DIAGNOSIS — M79669 Pain in unspecified lower leg: Secondary | ICD-10-CM

## 2017-12-17 DIAGNOSIS — Z8673 Personal history of transient ischemic attack (TIA), and cerebral infarction without residual deficits: Secondary | ICD-10-CM | POA: Diagnosis not present

## 2017-12-17 DIAGNOSIS — R600 Localized edema: Secondary | ICD-10-CM

## 2017-12-17 DIAGNOSIS — M5442 Lumbago with sciatica, left side: Secondary | ICD-10-CM

## 2017-12-17 DIAGNOSIS — R768 Other specified abnormal immunological findings in serum: Secondary | ICD-10-CM

## 2017-12-17 DIAGNOSIS — G8929 Other chronic pain: Secondary | ICD-10-CM

## 2017-12-17 NOTE — Progress Notes (Signed)
Subjective: Chief Complaint  Patient presents with  . follow up    follow up abdominal pain.  OB/Gyn states that she cancelled appointment.  upper right side pain   Here for follow-up.  At her last visit we had discussed what she reported swelling in her leg and calf pain, on the left side.  [place]  Since last visit she reports that the calf on the left is still tender, that leg still seems bigger than the other leg, she reports pains from her back radiating down her left leg as well as hip pain  Prior to last visit she reported some belly pain but not so much lately.  No fever no urinary complaint no bowel complaint currently  After last visit we had referred to gynecology to have an ultrasound of her abdomen and pelvis to rule out other causes of asymmetrical swelling but she says she never got a call.  We called gynecology and they report that patient canceled the appointment  She brings her FMLA back today reporting that some items need to be corrected   Past Medical History:  Diagnosis Date  . Allergy   . Anemia    iron therapy for years as of 10/12; normal Hgb 08/2013  . Arthritis   . Chest pain 04/05/2011   cardiac eval, normal treadmill stress test, Dr. Tollie Eth  . Chronic back pain   . Constipation   . Farsightedness    wears glasses, Eye care center  . 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 [name], [facility]  . GERD (gastroesophageal reflux disease)   . History of uterine fibroid   . Hyperlipidemia   . Hypertension   . Paresthesia 09/2014   initially thought to be TIA, neurology consult in 12/2014 with other non TIA considerations.    . Polyarthralgia    normal rheumatoid screen 01/2012   Current Outpatient Medications on File Prior to Visit  Medication Sig Dispense Refill  . aspirin EC 81 MG tablet Take 1 tablet (81 mg total) by mouth daily. 90 tablet 3  .  cholecalciferol (VITAMIN D) 1000 units tablet Take 1 tablet (1,000 Units total) by mouth daily. 90 tablet 3  . dexlansoprazole (DEXILANT) 60 MG capsule Take 1 capsule (60 mg total) by mouth daily. 90 capsule 3  . EQ ALLERGY RELIEF 10 MG tablet TAKE ONE TABLET BY MOUTH ONCE DAILY 30 tablet 11  . famotidine (PEPCID) 20 MG tablet Take 1 tablet (20 mg total) by mouth at bedtime. 90 tablet 3  . fluticasone (FLONASE) 50 MCG/ACT nasal spray USE ONE SPRAY(S) IN EACH NOSTRIL DAILY AS NEEDED FOR ALLERGIES OR RHINITIS. 16 g 11  . gabapentin (NEURONTIN) 300 MG capsule TAKE ONE TABLET BY MOUTH IN THE AFTERNOON AND 2 AT BEDTIME 270 capsule 3  . hydrocortisone (PROCTOCORT) 1 % CREA Apply 1 application topically daily. 45 g 1  . hydroxychloroquine (PLAQUENIL) 200 MG tablet Take 1 tablet (200 mg total) by mouth 2 (two) times daily. 60 tablet 2  . losartan-hydrochlorothiazide (HYZAAR) 50-12.5 MG tablet Take 1 tablet by mouth daily. 90 tablet 3  . meloxicam (MOBIC) 7.5 MG tablet Take 1 tablet (7.5 mg total) by mouth daily. 30 tablet 1  . ondansetron (ZOFRAN) 4 MG tablet TAKE ONE TABLET BY MOUTH EVERY 8 HOURS AS NEEDED FOR  NAUSEA  OR  VOMITING 20 tablet 2  . polyethylene glycol powder (GLYCOLAX/MIRALAX) powder TAKE ONE  CAPFUL (17 GRAMS) BY MOUTH DAILY. 850 g 3  . potassium chloride (K-DUR,KLOR-CON) 10 MEQ tablet Take 1 tablet (10 mEq total) by mouth daily. 90 tablet 3  . simvastatin (ZOCOR) 20 MG tablet Take 1 tablet (20 mg total) by mouth daily. 90 tablet 3  . venlafaxine (EFFEXOR) 37.5 MG tablet Take 1 tablet (37.5 mg total) by mouth daily. 90 tablet 1  . Vitamin D, Ergocalciferol, (DRISDOL) 50000 units CAPS capsule Take 1 capsule (50,000 Units total) by mouth every 7 (seven) days. DISCONTINUE THE 50K WEEKLY DOSE, she is taing the 1000u daily dose 1 capsule 0  . cyclobenzaprine (FLEXERIL) 10 MG tablet Take 1 tablet (10 mg total) by mouth 2 (two) times daily as needed for muscle spasms. (Patient not taking: Reported  on 12/17/2017) 20 tablet 0   No current facility-administered medications on file prior to visit.    ROS as in subjective   Objective: BP 120/76   Pulse 86   Temp 97.8 F (36.6 C) (Oral)   Resp 16   Ht 5\' 9"  (1.753 m)   Wt 220 lb 3.2 oz (99.9 kg)   LMP 02/23/2015   SpO2 98%   BMI 32.52 kg/m   General appearance: alert, no distress, WD/WN,  Heart: RRR, normal S1, S2, no murmurs Lungs: CTA bilaterally, no wheezes, rhonchi, or rales Abdomen: +bs, soft, non tender, non distended, no masses, no hepatomegaly, no splenomegaly Back nontender but she seems to be lying on her right side in pain when lying supine on the exam table, she reports pain in the low back when touching toes Pain noted in right hip and trying to attempt range of motion which she seems guarded today but attributes this to the back pain and not in the hip.  Left calf mildly tender but only slight difference in girth compared to the right calf.  No obvious edema Pulses: 2+ symmetric, upper and lower extremities, normal cap refill Legs neurovascularly intact   Assessment: Encounter Diagnoses  Name Primary?  . Chronic left-sided low back pain with left-sided sciatica Yes  . Calf tenderness   . Lower extremity edema   . History of TIA (transient ischemic attack)   . Rheumatoid factor positive      Plan: I reviewed the lower extremity ultrasound from 12/05/2017 that showed no evidence of DVT.  Of note this is listed as an encounter from 12/05/2017 instead of in imaging as is usually the case  There is no recent lumbar spine imaging since 2015.  Per Dr. Estanislado Pandy rheumatology, she had an x-ray of her hip and pelvis on 10/24/2017 showing no SI joint changes or hip joint narrowing on the left.  I updated the FMLA with a few minor edits, but reviewed the info below with her. Patient Instructions   Encounter Diagnoses  Name Primary?  . Chronic left-sided low back pain with left-sided sciatica Yes  . Calf tenderness    . Lower extremity edema   . History of TIA (transient ischemic attack)   . Rheumatoid factor positive    I have reviewed your chart from last visit and now.  The current concern seem to be left-sided low back pain, left hip pain, left calf pain, and although there has been a concern about swelling I see no obvious swelling on exam  You had a pelvic x-ray of your left hip and pelvis in July per Dr. Bronson Curb that was normal   Your last lumbar spine x-ray was 2015.  Your recent ultrasound  of your leg showed no blood clot in September.  At this point I recommend either referral to physical therapy to work on back and hip and leg pain or follow-up with Dr. Estanislado Pandy on this  I do not see any obvious swelling today although there is slight asymmetry of the left calf.  I will follow-up with rheumatology for the pain you are having today unless you would like to be referred to physical therapy  At this point we can probably hold off on the pelvic ultrasound as your exam findings are improved since last visit    Lisa Briggs was seen today for follow up.  Diagnoses and all orders for this visit:  Chronic left-sided low back pain with left-sided sciatica  Calf tenderness  Lower extremity edema  History of TIA (transient ischemic attack)  Rheumatoid factor positive

## 2017-12-17 NOTE — Patient Instructions (Signed)
Encounter Diagnoses  Name Primary?  . Chronic left-sided low back pain with left-sided sciatica Yes  . Calf tenderness   . Lower extremity edema   . History of TIA (transient ischemic attack)   . Rheumatoid factor positive    I have reviewed your chart from last visit and now.  The current concern seem to be left-sided low back pain, left hip pain, left calf pain, and although there has been a concern about swelling I see no obvious swelling on exam  You had a pelvic x-ray of your left hip and pelvis in July per Dr. Bronson Curb that was normal   Your last lumbar spine x-ray was 2015.  Your recent ultrasound of your leg showed no blood clot in September.  At this point I recommend either referral to physical therapy to work on back and hip and leg pain or follow-up with Dr. Estanislado Pandy on this  I do not see any obvious swelling today although there is slight asymmetry of the left calf.  I will follow-up with rheumatology for the pain you are having today unless you would like to be referred to physical therapy  At this point we can probably hold off on the pelvic ultrasound as your exam findings are improved since last visit

## 2017-12-18 DIAGNOSIS — Z79899 Other long term (current) drug therapy: Secondary | ICD-10-CM | POA: Diagnosis not present

## 2017-12-24 ENCOUNTER — Telehealth: Payer: Self-pay | Admitting: Rheumatology

## 2017-12-24 ENCOUNTER — Other Ambulatory Visit: Payer: Self-pay

## 2017-12-24 ENCOUNTER — Telehealth: Payer: Self-pay | Admitting: *Deleted

## 2017-12-24 NOTE — Telephone Encounter (Signed)
Attempted to contact the patient and left message for patient to call the office. Patient has FMLA paperwork here at the office and we have not ever filled this paperwork out. Per Dr. Estanislado Pandy patient should have paperwork filled out by PCP.

## 2017-12-24 NOTE — Telephone Encounter (Signed)
Patient called stating her PCP told her she needed the [facility] paperwork filled out by Dr. Estanislado Pandy.  Patient states she is also taking Plaquenil which requires eye exam and needs that information included in the paperwork.

## 2017-12-24 NOTE — Telephone Encounter (Signed)
Ok, thank you. I'll return the payment.

## 2017-12-25 NOTE — Progress Notes (Signed)
Office Visit Note  [name]             Date of Birth: Jan 05, 1961           MRN: [id number]             PCP: Carlena Hurl, PA-C Referring: Carlena Hurl, PA-C Visit Date: 01/01/2018 Occupation: @GUAROCC @  Subjective:  Left hip pain.   History of Present Illness: Lisa Briggs is a 57 y.o. female with history of seropositive rheumatoid arthritis, osteoarthritis and degenerative disc disease.  She had been on Plaquenil since the first week of September.  She has noticed improvement in the joint pain and stiffness.  She states she continues to have some discomfort in her left hip which she describes over the trochanteric area which has been radiating down her left lower extremity.  Lower back pain persist.  She states that she has intermittent flares of arthritis when she has difficulty walking.  She also has intermittent lower back pain.  She states she has to miss work sometimes because of that reason.  She also has doctor's visits.  She would like for [place] to fill FMLA papers.   Activities of Daily Living:  Patient reports morning stiffness for 5 minutes.   Patient Denies nocturnal pain.  Difficulty dressing/grooming: Denies Difficulty climbing stairs: Reports Difficulty getting out of chair: Reports Difficulty using hands for taps, buttons, cutlery, and/or writing: Denies  Review of Systems  Constitutional: Negative for fatigue, night sweats, weight gain and weight loss.  HENT: Negative for mouth sores, trouble swallowing, trouble swallowing, mouth dryness and nose dryness.   Eyes: Negative for pain, redness, visual disturbance and dryness.  Respiratory: Negative for cough, shortness of breath and difficulty breathing.   Cardiovascular: Negative for chest pain, palpitations, hypertension, irregular heartbeat and swelling in legs/feet.  Gastrointestinal: Positive for constipation, heartburn and nausea. Negative for blood in stool and diarrhea.  Endocrine: Negative  for increased urination.  Genitourinary: Negative for vaginal dryness.  Musculoskeletal: Positive for arthralgias, joint pain and morning stiffness. Negative for joint swelling, myalgias, muscle weakness, muscle tenderness and myalgias.  Skin: Negative for color change, rash, hair loss, skin tightness, ulcers and sensitivity to sunlight.  Allergic/Immunologic: Negative for susceptible to infections.  Neurological: Negative for dizziness, memory loss, night sweats and weakness.  Hematological: Negative for swollen glands.  Psychiatric/Behavioral: Positive for depressed mood. Negative for sleep disturbance. The patient is nervous/anxious.     PMFS History:  Patient Active Problem List   Diagnosis Date Noted  . Leg swelling 12/05/2017  . Need for shingles vaccine 12/05/2017  . Primary osteoarthritis of both hands 11/23/2017  . Rheumatoid factor positive 11/23/2017  . Primary osteoarthritis of both knees 11/23/2017  . Primary osteoarthritis of both feet 11/23/2017  . DDD (degenerative disc disease), lumbar 11/23/2017  . Vaccine counseling 08/13/2017  . Polyarthralgia 08/13/2017  . Joint stiffness 08/13/2017  . Sensitive skin 01/23/2017  . Need for influenza vaccination 01/23/2017  . Proteinuria 01/23/2017  . History of gastrointestinal stromal tumor (GIST) 04/20/2016  . History of TIA (transient ischemic attack) 04/20/2016  . Screening for breast cancer 04/20/2016  . Estrogen deficiency 04/20/2016  . Chronic maxillary sinusitis 04/20/2016  . Impaired fasting blood sugar 04/20/2016  . Constipation 04/20/2016  . History of fall 04/20/2016  . Chronic radicular lumbar pain 01/27/2016  . Encounter for health maintenance examination in adult 03/22/2015  . Paresthesia 03/22/2015  . Cognitive decline 03/22/2015  . Screening for cervical cancer 03/22/2015  .  Vitamin D deficiency 03/22/2015  . History of uterine leiomyoma 03/22/2015  . Gastroesophageal reflux disease without esophagitis  03/02/2014  . Chronic nausea 03/02/2014  . Chronic abdominal pain 03/02/2014  . Rhinitis, allergic 03/02/2014  . Essential hypertension 03/02/2014  . Hyperlipidemia 03/02/2014  . Class 1 obesity with serious comorbidity in adult 01/16/2012    Past Medical History:  Diagnosis Date  . Allergy   . Anemia    iron therapy for years as of 10/12; normal Hgb 08/2013  . Arthritis   . Chest pain 04/05/2011   cardiac eval, normal treadmill stress test, Dr. Tollie Eth  . Chronic back pain   . Constipation   . Farsightedness    wears glasses, Eye care center  . Gastrointestinal stromal tumor (GIST) ([place]) 06/2014   Dr. [name], [facility]  . GERD (gastroesophageal reflux disease)   . History of uterine fibroid   . Hyperlipidemia   . Hypertension   . Paresthesia 09/2014   initially thought to be TIA, neurology consult in 12/2014 with other non TIA considerations.    . Polyarthralgia    normal rheumatoid screen 01/2012    Family History  Problem Relation Age of Onset  . Hypertension Mother   . Arthritis Mother   . GER disease Mother   . Glaucoma Mother   . Breast cancer Mother   . Stroke Father   . Breast cancer Sister        breast cancer dx late 93s  . Hypertension Sister   . Colon cancer Brother 38  . Diabetes Brother   . Diabetes Maternal Aunt   . Heart disease Maternal Aunt   . Heart disease Maternal Grandmother   . Heart disease Maternal Grandfather   . Heart disease Maternal Uncle   . Stroke Maternal Uncle   . Leukemia Sister   . Hypertension Sister   . Healthy Son   . Colon polyps Neg Hx    Past Surgical History:  Procedure Laterality Date  . COLONOSCOPY  01/2014   diverticulosis, othwerise normal - Dr. Owens Loffler  . ESOPHAGOGASTRODUODENOSCOPY  2013   Dr. Benson [place], gastritis  . ESOPHAGOGASTRODUODENOSCOPY  [id number]  . EUS N/A 04/16/2014   Procedure: UPPER ENDOSCOPIC ULTRASOUND (EUS) LINEAR;  Surgeon: Milus Banister, MD;  Location: [facility];   Service: Endoscopy;  Laterality: N/A;  . gall stone surgery    . KNEE ARTHROSCOPY Left   . LAPAROSCOPIC GASTRIC RESECTION N/A 06/09/2014   Procedure: LAPAROSCOPIC GASTRIC MASS RESECTION;  Surgeon: [name], MD;  Location: [facility];  Service: General;  Laterality: N/A;  . LIPOMA EXCISION     forehead  . UTERINE FIBROID SURGERY     Social History   Social History Narrative   Married, has 1 son in [place] and some grandchildren.  Glass blower/designer.  Active on job.  Does stretching and exercises daily as per physical therapy.  Works 12 hours daily.      Objective: Vital Signs: BP 130/76 (BP Location: Right Arm, Patient Position: Sitting, Cuff Size: Large)   Pulse 70   Resp 14   Ht 5\' 9"  (1.753 m)   Wt 220 lb (99.8 kg)   LMP 02/23/2015   BMI 32.49 kg/m    Physical Exam  Constitutional: She is oriented to person, place, and time. She appears well-developed and well-nourished.  HENT:  Head: Normocephalic and atraumatic.  Eyes: Conjunctivae and EOM are normal.  Neck: Normal range of motion.  Cardiovascular: Normal rate, regular rhythm, normal  heart sounds and intact distal pulses.  Pulmonary/Chest: Effort normal and breath sounds normal.  Abdominal: Soft. Bowel sounds are normal.  Lymphadenopathy:    She has no cervical adenopathy.  Neurological: She is alert and oriented to person, place, and time.  Skin: Skin is warm and dry. Capillary refill takes less than 2 seconds.  Psychiatric: She has a normal mood and affect. Her behavior is normal.  Nursing note and vitals reviewed.    Musculoskeletal Exam: C-spine thoracic lumbar spine good range of motion.  She had discomfort range of motion of her lumbar spine.  Shoulder joints elbow joints wrist joint MCPs PIPs DIPs were in good range of motion with no synovitis.  She does have DIP and PIP thickening in her hands and feet consistent with osteoarthritis.  She is crepitus in her bilateral knee joints.  CDAI Exam: CDAI Score: 0.4    Patient Global Assessment: 2 (mm); Provider Global Assessment: 2 (mm) Swollen: 0 ; Tender: 0  Joint Exam   Not documented   There is currently no information documented on the homunculus. Go to the Rheumatology activity and complete the homunculus joint exam.  Investigation: No additional findings.  Imaging: No results found.  Recent Labs: Lab Results  Component Value Date   WBC 5.3 09/12/2016   HGB 12.9 09/12/2016   PLT 375 09/12/2016   NA 141 09/12/2016   K 4.6 09/12/2016   CL 105 09/12/2016   CO2 29 09/12/2016   GLUCOSE 91 09/12/2016   BUN 13 09/12/2016   CREATININE 0.77 09/12/2016   BILITOT 0.5 04/20/2016   ALKPHOS 96 04/20/2016   AST 15 04/20/2016   ALT 13 04/20/2016   PROT 6.9 04/20/2016   ALBUMIN 3.7 04/20/2016   CALCIUM 9.6 09/12/2016   GFRAA >60 09/27/2015    Speciality Comments: No specialty comments available.  Procedures:  No procedures performed Allergies: Kiwi extract; Mucinex dm [dm-guaifenesin er]; and Peach [prunus persica]   Assessment / Plan:     Visit Diagnoses: Rheumatoid arthritis involving multiple sites with positive rheumatoid factor (HCC) - +RF, +CCP Ab.  Patient notices some improvement in her symptoms that she has been taking Plaquenil.  She has increasing stiffness and decreased pain.  High risk medication use - PLQ 200 mg p.o. twice daily.  Patient reports having eye exam.  Primary osteoarthritis of both hands-joint protection muscle strengthening was discussed.  Primary osteoarthritis of both knees - Bilateral moderate with severe chondromalacia patella.  Primary osteoarthritis of both feet  DDD (degenerative disc disease), lumbar-she is continues to have some lower back pain with left-sided radiculopathy intermittently.  I will refer her to physical therapy.    Patient requests to fill FMLA for flares of rheumatoid arthritis and lower back pain.  Which was filled today.  Vitamin D deficiency-she has been taking vitamin D  supplement. Other medical problems listed as follows.  Essential hypertension  History of TIA (transient ischemic attack)  Hyperlipidemia, unspecified hyperlipidemia type  Gastroesophageal reflux disease without esophagitis   Orders: Orders Placed This Encounter  Procedures  . Ambulatory referral to Physical Therapy   No orders of the defined types were placed in this encounter.   Face-to-face time spent with patient was 20 minutes. Greater than 50% of time was spent in counseling and coordination of care.  Follow-Up Instructions: Return in about 3 months (around 04/03/2018) for Rheumatoid arthritis.   Bo Merino, MD  Note - This record has been created using Editor, commissioning.  Chart creation errors have been  sought, but may not always  have been located. Such creation errors do not reflect on  the standard of medical care.

## 2017-12-25 NOTE — Telephone Encounter (Signed)
Attempted to contact patient and left message for patient to call the office. Patient will need to schedule an appointment to discuss FMLA paperwork with Dr. Estanislado Pandy before it can be filled out.

## 2018-01-01 ENCOUNTER — Encounter: Payer: Self-pay | Admitting: Medical

## 2018-01-01 ENCOUNTER — Encounter: Payer: Self-pay | Admitting: *Deleted

## 2018-01-01 ENCOUNTER — Encounter: Payer: Self-pay | Admitting: Rheumatology

## 2018-01-01 ENCOUNTER — Ambulatory Visit (INDEPENDENT_AMBULATORY_CARE_PROVIDER_SITE_OTHER): Payer: BLUE CROSS/BLUE SHIELD | Admitting: Rheumatology

## 2018-01-01 VITALS — BP 130/76 | HR 70 | Resp 14 | Ht 69.0 in | Wt 220.0 lb

## 2018-01-01 DIAGNOSIS — M19041 Primary osteoarthritis, right hand: Secondary | ICD-10-CM | POA: Diagnosis not present

## 2018-01-01 DIAGNOSIS — M0579 Rheumatoid arthritis with rheumatoid factor of multiple sites without organ or systems involvement: Secondary | ICD-10-CM | POA: Diagnosis not present

## 2018-01-01 DIAGNOSIS — E559 Vitamin D deficiency, unspecified: Secondary | ICD-10-CM

## 2018-01-01 DIAGNOSIS — M170 Bilateral primary osteoarthritis of knee: Secondary | ICD-10-CM | POA: Diagnosis not present

## 2018-01-01 DIAGNOSIS — Z8673 Personal history of transient ischemic attack (TIA), and cerebral infarction without residual deficits: Secondary | ICD-10-CM

## 2018-01-01 DIAGNOSIS — I10 Essential (primary) hypertension: Secondary | ICD-10-CM

## 2018-01-01 DIAGNOSIS — Z79899 Other long term (current) drug therapy: Secondary | ICD-10-CM

## 2018-01-01 DIAGNOSIS — E785 Hyperlipidemia, unspecified: Secondary | ICD-10-CM

## 2018-01-01 DIAGNOSIS — M5136 Other intervertebral disc degeneration, lumbar region: Secondary | ICD-10-CM

## 2018-01-01 DIAGNOSIS — K219 Gastro-esophageal reflux disease without esophagitis: Secondary | ICD-10-CM

## 2018-01-01 DIAGNOSIS — M19071 Primary osteoarthritis, right ankle and foot: Secondary | ICD-10-CM

## 2018-01-01 DIAGNOSIS — M19042 Primary osteoarthritis, left hand: Secondary | ICD-10-CM

## 2018-01-01 DIAGNOSIS — M19072 Primary osteoarthritis, left ankle and foot: Secondary | ICD-10-CM

## 2018-01-27 ENCOUNTER — Other Ambulatory Visit: Payer: Self-pay | Admitting: Rheumatology

## 2018-01-28 ENCOUNTER — Other Ambulatory Visit: Payer: Self-pay | Admitting: Medical

## 2018-01-28 ENCOUNTER — Telehealth: Payer: Self-pay | Admitting: Medical

## 2018-01-28 MED ORDER — CYCLOBENZAPRINE HCL 10 MG PO TABS: 10.0000 mg | ORAL_TABLET | Freq: Two times a day (BID) | ORAL | 2 refills | Status: DC | PRN

## 2018-01-28 NOTE — Telephone Encounter (Signed)
Pt called for refills of flexeril. Please send to [facility].

## 2018-01-28 NOTE — Telephone Encounter (Signed)
done

## 2018-01-28 NOTE — Telephone Encounter (Signed)
Is this ok to refill?  

## 2018-01-28 NOTE — Telephone Encounter (Signed)
Last Visit: 01/01/18 Next Visit: 04/05/18 Labs: 11/03/17 low potassium 3.3 PLQ Eye Exam: 12/18/17 WNL   Left message to advise patient she is due to update labs. Patient was due to update 1 month after starting PLQ.  Okay to refill per Dr. Estanislado Pandy

## 2018-01-29 NOTE — Telephone Encounter (Signed)
Left message informing pt. 

## 2018-02-19 ENCOUNTER — Other Ambulatory Visit: Payer: Self-pay | Admitting: Rheumatology

## 2018-02-19 NOTE — Telephone Encounter (Signed)
Last Visit: 01/01/18 Next Visit: 04/05/18 Labs: 11/03/17 low potassium 3.3 PLQ Eye Exam: 12/18/17 WNL  Okay to refill per Dr. Estanislado Pandy

## 2018-03-06 ENCOUNTER — Telehealth: Payer: Self-pay

## 2018-03-06 ENCOUNTER — Other Ambulatory Visit: Payer: Self-pay | Admitting: Medical

## 2018-03-06 MED ORDER — FLUTICASONE PROPIONATE 50 MCG/ACT NA SUSP: NASAL | 11 refills | Status: DC

## 2018-03-06 NOTE — Telephone Encounter (Signed)
Pharmacy sent fax stating that patient is requesting a refill on flonase. Please advise.

## 2018-03-07 ENCOUNTER — Ambulatory Visit: Payer: BLUE CROSS/BLUE SHIELD | Admitting: Physician Assistant

## 2018-03-14 ENCOUNTER — Other Ambulatory Visit: Payer: Self-pay | Admitting: Rheumatology

## 2018-03-15 NOTE — Telephone Encounter (Signed)
Last Visit: 01/01/18 Next Visit: 04/05/18 Labs: 11/03/17 low potassium 3.3 PLQ Eye Exam: 12/18/17 WNL  Okay to refill per Dr. Estanislado Pandy

## 2018-03-19 ENCOUNTER — Other Ambulatory Visit: Payer: Self-pay | Admitting: Medical

## 2018-03-22 NOTE — Progress Notes (Signed)
Office Visit Note  [name]             Date of Birth: 09-27-1960           MRN: [id number]             PCP: Carlena Hurl, PA-C Referring: Carlena Hurl, PA-C Visit Date: 04/05/2018 Occupation: @GUAROCC @  Subjective:  Left elbow and left ankle pain    History of Present Illness: Lisa Briggs is a 57 y.o. female with history of rheumatoid arthritis, osteoarthritis, and DDD. She is taking Plaquenil 200 mg BID M-F.  She denies any recent rheumatoid arthritis flares.  She states she is been having increased stiffness in multiple joints with recent weather changes.  She states that she fell on Monday taking down Christmas decorations.  She states that she fell off a stepstool and landed on her left side.  She was evaluated at urgent care on Monday and had an x-ray of the left ankle.  She has been in an Aircast since.  On Wednesday she developed left elbow pain and was evaluated by Dr. Durward Fortes yesterday.  He drained her left elbow and started her on doxycycline.  She took her first dose of doxycycline yesterday.  She has been taking Tylenol as well as Mobic 7.5 mg for pain relief.  She has been wearing the sling.  She states to experience discomfort and difficulty sleeping last night due to the pain in her left elbow.  She will be following up with Dr. Durward Fortes closely.  She does not need any refills of Plaquenil at this time.     Activities of Daily Living:  Patient reports morning stiffness for 10-15  minutes.   Patient Reports nocturnal pain.  Difficulty dressing/grooming: Denies Difficulty climbing stairs: Reports Difficulty getting out of chair: Reports Difficulty using hands for taps, buttons, cutlery, and/or writing: Reports  Review of Systems  Constitutional: Positive for fatigue.  HENT: Positive for mouth dryness. Negative for mouth sores and nose dryness.   Eyes: Positive for dryness. Negative for pain and visual disturbance.  Respiratory: Negative for  cough, hemoptysis, shortness of breath and difficulty breathing.   Cardiovascular: Negative for chest pain, palpitations, hypertension and swelling in legs/feet.  Gastrointestinal: Negative for blood in stool, constipation and diarrhea.  Endocrine: Negative for increased urination.  Genitourinary: Negative for painful urination.  Musculoskeletal: Positive for arthralgias, joint pain, myalgias, morning stiffness and myalgias. Negative for joint swelling, muscle weakness and muscle tenderness.  Skin: Negative for color change, pallor, rash, hair loss, nodules/bumps, skin tightness, ulcers and sensitivity to sunlight.  Allergic/Immunologic: Negative for susceptible to infections.  Neurological: Negative for dizziness, numbness, headaches and weakness.  Hematological: Negative for swollen glands.  Psychiatric/Behavioral: Negative for depressed mood and sleep disturbance. The patient is not nervous/anxious.     PMFS History:  Patient Active Problem List   Diagnosis Date Noted  . Leg swelling 12/05/2017  . Need for shingles vaccine 12/05/2017  . Primary osteoarthritis of both hands 11/23/2017  . Rheumatoid factor positive 11/23/2017  . Primary osteoarthritis of both knees 11/23/2017  . Primary osteoarthritis of both feet 11/23/2017  . DDD (degenerative disc disease), lumbar 11/23/2017  . Vaccine counseling 08/13/2017  . Polyarthralgia 08/13/2017  . Joint stiffness 08/13/2017  . Sensitive skin 01/23/2017  . Need for influenza vaccination 01/23/2017  . Proteinuria 01/23/2017  . History of gastrointestinal stromal tumor (GIST) 04/20/2016  . History of TIA (transient ischemic attack) 04/20/2016  . Screening for breast cancer  04/20/2016  . Estrogen deficiency 04/20/2016  . Chronic maxillary sinusitis 04/20/2016  . Impaired fasting blood sugar 04/20/2016  . Constipation 04/20/2016  . History of fall 04/20/2016  . Chronic radicular lumbar pain 01/27/2016  . Encounter for health maintenance  examination in adult 03/22/2015  . Paresthesia 03/22/2015  . Cognitive decline 03/22/2015  . Screening for cervical cancer 03/22/2015  . Vitamin D deficiency 03/22/2015  . History of uterine leiomyoma 03/22/2015  . Gastroesophageal reflux disease without esophagitis 03/02/2014  . Chronic nausea 03/02/2014  . Chronic abdominal pain 03/02/2014  . Rhinitis, allergic 03/02/2014  . Essential hypertension 03/02/2014  . Hyperlipidemia 03/02/2014  . Class 1 obesity with serious comorbidity in adult 01/16/2012    Past Medical History:  Diagnosis Date  . Allergy   . Anemia    iron therapy for years as of 10/12; normal Hgb 08/2013  . Arthritis   . Chest pain 04/05/2011   cardiac eval, normal treadmill stress test, Dr. Tollie Eth  . Chronic back pain   . Constipation   . Farsightedness    wears glasses, Eye care center  . 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 [name], [facility]  . GERD (gastroesophageal reflux disease)   . History of uterine fibroid   . Hyperlipidemia   . Hypertension   . Paresthesia 09/2014   initially thought to be TIA, neurology consult in 12/2014 with other non TIA considerations.    . Polyarthralgia    normal rheumatoid screen 01/2012    Family History  Problem Relation Age of Onset  . Hypertension Mother   . Arthritis Mother   . GER disease Mother   . Glaucoma Mother   . Breast cancer Mother   . Stroke Father   . Breast cancer Sister        breast cancer dx late 36s  . Hypertension Sister   . Colon cancer Brother 85  . Diabetes Brother   . Diabetes Maternal Aunt   . Heart disease Maternal Aunt   . Heart disease Maternal Grandmother   . Heart disease Maternal Grandfather   . Heart disease Maternal Uncle   . Stroke Maternal Uncle   . Leukemia Sister   . Hypertension Sister   . Healthy Son   . Colon polyps Neg Hx    Past Surgical History:  Procedure Laterality Date  . COLONOSCOPY  01/2014    diverticulosis, othwerise normal - Dr. Owens Loffler  . ESOPHAGOGASTRODUODENOSCOPY  2013   Dr. Benson Norway, gastritis  . ESOPHAGOGASTRODUODENOSCOPY  [id number]  . EUS N/A 04/16/2014   Procedure: UPPER ENDOSCOPIC ULTRASOUND (EUS) LINEAR;  Surgeon: Milus Banister, MD;  Location: [facility];  Service: Endoscopy;  Laterality: N/A;  . gall stone surgery    . KNEE ARTHROSCOPY Left   . LAPAROSCOPIC GASTRIC RESECTION N/A 06/09/2014   Procedure: LAPAROSCOPIC GASTRIC MASS RESECTION;  Surgeon: [name], MD;  Location: [facility];  Service: General;  Laterality: N/A;  . LIPOMA EXCISION     forehead  . UTERINE FIBROID SURGERY     Social History   Social History Narrative   Married, has 1 son in [place] and some grandchildren.  Glass blower/designer.  Active on job.  Does stretching and exercises daily as per physical therapy.  Works 12 hours daily.      Objective: Vital Signs: BP (!) 85/58 (BP Location: Right Arm, Patient Position: Sitting, Cuff Size: Large)   Pulse (!) 104   Temp 98.7 F (37.1 C) (Oral)  Resp 15   Ht 5\' 9"  (1.753 m)   Wt 215 lb (97.5 kg) Comment: per patient  LMP 02/23/2015   BMI 31.75 kg/m    Physical Exam Vitals signs and nursing note reviewed.  Constitutional:      Appearance: She is well-developed.  HENT:     Head: Normocephalic and atraumatic.  Eyes:     Conjunctiva/sclera: Conjunctivae normal.  Neck:     Musculoskeletal: Normal range of motion.  Cardiovascular:     Rate and Rhythm: Normal rate and regular rhythm.     Heart sounds: Normal heart sounds.  Pulmonary:     Effort: Pulmonary effort is normal.     Breath sounds: Normal breath sounds.  Abdominal:     General: Bowel sounds are normal.     Palpations: Abdomen is soft.  Lymphadenopathy:     Cervical: No cervical adenopathy.  Skin:    General: Skin is warm and dry.     Capillary Refill: Capillary refill takes less than 2 seconds.  Neurological:     Mental Status: She is alert and oriented to person, place,  and time.  Psychiatric:        Behavior: Behavior normal.      Musculoskeletal Exam: C-spine, thoracic spine, lumbar spine good range of motion.  Mild midline spinal tenderness in the lumbar region.  Left elbow is in a sling.  Right shoulder, right elbow, right wrist, MCPs, PIPs, DIPs good range of motion with no synovitis.  Left arm edema and mild erythema noted. Left elbow wrapped in a ace wrap.  She has DIP and PIP synovial thickening consistent with osteoarthritis of bilateral hands.  Hip joints good range of motion with no discomfort.  Knee joints good range of motion with no warmth or effusion.  She has bilateral knee crepitus.  She is wearing a left ankle aircast.   CDAI Exam: CDAI Score: 1  Patient Global Assessment: 5 (mm); Provider Global Assessment: 5 (mm) Swollen: 0 ; Tender: 0  Joint Exam   Not documented   There is currently no information documented on the homunculus. Go to the Rheumatology activity and complete the homunculus joint exam.  Investigation: No additional findings.  Imaging: Dg Ankle Complete Left  Result Date: 04/01/2018 CLINICAL DATA:  Lateral left ankle pain following a fall. EXAM: LEFT ANKLE COMPLETE - 3+ VIEW COMPARISON:  None. FINDINGS: Diffuse lateral soft tissue swelling. Possible small avulsion fracture off the distal aspect of the lateral malleolus on the frontal view. Otherwise, no fractures are seen. No effusion. Mild inferior calcaneal spur formation. Mild distal medial malleolus spur formation. IMPRESSION: 1. Possible small avulsion fracture off the distal aspect of the lateral malleolus. 2. Mild degenerative changes. Electronically Signed   By: Claudie Revering M.D.   On: 04/01/2018 17:11   Xr Elbow 2 Views Left  Result Date: 04/04/2018 Films of the left elbow were obtained in several projections.  There were no acute changes or evidence of fracture.  Negative fat pad sign   Recent Labs: Lab Results  Component Value Date   WBC 5.3 09/12/2016    HGB 12.9 09/12/2016   PLT 375 09/12/2016   NA 141 09/12/2016   K 4.6 09/12/2016   CL 105 09/12/2016   CO2 29 09/12/2016   GLUCOSE 91 09/12/2016   BUN 13 09/12/2016   CREATININE 0.77 09/12/2016   BILITOT 0.5 04/20/2016   ALKPHOS 96 04/20/2016   AST 15 04/20/2016   ALT 13 04/20/2016   PROT 6.9  04/20/2016   ALBUMIN 3.7 04/20/2016   CALCIUM 9.6 09/12/2016   GFRAA >60 09/27/2015    Speciality Comments: PLQ Eye Exam: 12/18/17 [facility] @ [facility], O.D, P.A Follow up in 1 year  Procedures:  No procedures performed Allergies: Kiwi extract; Mucinex dm [dm-guaifenesin er]; and Peach [prunus persica]     Assessment / Plan:     Visit Diagnoses: Rheumatoid arthritis involving multiple sites with positive rheumatoid factor ([place]) - +RF, +CCP: She has no synovitis on exam. She has stiffness in multiple joints lasting about 15 minutes in the morning and a few minutes after sitting for prolonged periods of time. She is clinically doing well on Plaquenil 200 mg 1 table by mouth BID M-F.  She was started on PLQ in September 2019. She has been tolerating it well without any side effects.  She has not missed any doses recently.  She does not need any refills at this time.  She was advised to notify us if she develops increased joint pain or joint swelling.  She will follow up in 5 months.   High risk medication use - PLQ 200 mg p.o. twice daily. CBC and CMP were drawn on 11/03/17. CBC and CMP were drawn today to monitor for drug toxicity. PLQ eye exam was normal on 12/18/17.  - Plan: CBC with Differential/Platelet, COMPLETE METABOLIC PANEL WITH GFR  Primary osteoarthritis of both hands: She has PIP and DIP synovial thickening consistent with osteoarthritis of both hands.  She has no synovitis.  She has complete fist formation bilaterally. Joint protection and muscle strengthening were discussed.   Primary osteoarthritis of both knees: No warmth or effusion.  Good ROM.  She has bilateral knee joint  crepitus.   Primary osteoarthritis of both feet: She is currently in a left ankle equalizer boot for a possible small left lateral malleolus avulsion fracture.   DDD (degenerative disc disease), lumbar: She has mild midline spinal tenderness.    Septic olecranon bursitis of left elbow: She fell on Monday taking down Christmas decorations and left landed on her left side.  On Wednesday she developed left elbow pain, and she was evaluated by Dr. Durward Fortes yesterday who obtained an x-ray of the left elbow that revealed an olecranon spur.   On exam she had an abscess with cellulitis, and he drained the olecranon bursa and purulent drainage was sent for culture and sensitivity.  The incision was packed with iodoform foam gauze and a bulky dressing was applied.  She is currently in a sling.  She has left arm edema but no tenderness or erythema along the forearm. She was started on doxycycline 100 mg by mouth twice daily.  She is afebrile in the office today.  She has taken meloxicam 7.5 mg by mouth for pain relief.  She took tylenol last night for pain relief.  Dr. Durward Fortes examined her today in the office. He changed her gauze and dressing.  She will be following up with him on Monday.   Closed avulsion fracture of left ankle with routine healing, subsequent encounter: She fell off of a stepstool on Monday and landed on her left side.  She was evaluated at Urgent care.  A x-ray of the left ankle was obtained which revealed a possible small avulsion fracture off the distal aspect of the lateral malleolus.  She was placed in an equalizer boot and given crutches.  She has been taking Meloxicam 7.5 mg po daily. She was evaluated by Dr. Durward Fortes on Thursday. She will  be following up with Dr. Durward Fortes on Monday.   Other medical conditions are listed as follows:   Vitamin D deficiency  Essential hypertension  History of TIA (transient ischemic attack)  Gastroesophageal reflux disease without  esophagitis  History of hyperlipidemia   Orders: Orders Placed This Encounter  Procedures  . CBC with Differential/Platelet  . COMPLETE METABOLIC PANEL WITH GFR   No orders of the defined types were placed in this encounter.   Face-to-face time spent with patient was 30 minutes. Greater than 50% of time was spent in counseling and coordination of care.  Follow-Up Instructions: Return in about 5 months (around 09/04/2018) for Rheumatoid arthritis, Osteoarthritis, DDD.   Ofilia Neas, PA-C  Note - This record has been created using Dragon software.  Chart creation errors have been sought, but may not always  have been located. Such creation errors do not reflect on  the standard of medical care.

## 2018-04-01 ENCOUNTER — Ambulatory Visit (HOSPITAL_COMMUNITY)
Admission: EM | Admit: 2018-04-01 | Discharge: 2018-04-01 | Disposition: A | Discharge status: Home / Self Care | Payer: BLUE CROSS/BLUE SHIELD | Attending: Urgent Care | Admitting: Urgent Care

## 2018-04-01 ENCOUNTER — Telehealth (HOSPITAL_COMMUNITY): Payer: Self-pay | Admitting: Emergency Medicine

## 2018-04-01 ENCOUNTER — Encounter (HOSPITAL_COMMUNITY): Payer: Self-pay

## 2018-04-01 ENCOUNTER — Ambulatory Visit (INDEPENDENT_AMBULATORY_CARE_PROVIDER_SITE_OTHER): Payer: BLUE CROSS/BLUE SHIELD

## 2018-04-01 DIAGNOSIS — S92155A Nondisplaced avulsion fracture (chip fracture) of left talus, initial encounter for closed fracture: Secondary | ICD-10-CM | POA: Diagnosis not present

## 2018-04-01 DIAGNOSIS — M25572 Pain in left ankle and joints of left foot: Secondary | ICD-10-CM | POA: Insufficient documentation

## 2018-04-01 DIAGNOSIS — S82892A Other fracture of left lower leg, initial encounter for closed fracture: Secondary | ICD-10-CM | POA: Diagnosis not present

## 2018-04-01 DIAGNOSIS — S99912A Unspecified injury of left ankle, initial encounter: Secondary | ICD-10-CM | POA: Diagnosis not present

## 2018-04-01 DIAGNOSIS — S93402A Sprain of unspecified ligament of left ankle, initial encounter: Secondary | ICD-10-CM | POA: Insufficient documentation

## 2018-04-01 MED ORDER — MELOXICAM 7.5 MG PO TABS: 7.5000 mg | ORAL_TABLET | Freq: Every day | ORAL | 0 refills | Status: DC

## 2018-04-01 NOTE — Discharge Instructions (Addendum)
You may take 500mg  Tylenol every 6 hours for pain and inflammation. You can take this with meloxicam once daily.

## 2018-04-01 NOTE — ED Provider Notes (Signed)
MRN: [id number] DOB: 04-Jan-1961  Subjective:   Lisa Briggs is a 57 y.o. female presenting for acute left ankle injury today while on the stepstool.  Patient states that she was trying to rotate and pivot, lost her footing and fell twisting her ankle a few hours ago.  She tried icing but noticed that the swelling was worsening.  She currently reports moderate to severe left ankle pain and difficulty bearing weight.  Patient is very upset that 2 people that registered after her were triaged and roomed before her.  No current facility-administered medications for this encounter.   Current Outpatient Medications:  .  aspirin EC 81 MG tablet, Take 1 tablet (81 mg total) by mouth daily., Disp: 90 tablet, Rfl: 3 .  cholecalciferol (VITAMIN D) 1000 units tablet, Take 1 tablet (1,000 Units total) by mouth daily., Disp: 90 tablet, Rfl: 3 .  cyclobenzaprine (FLEXERIL) 10 MG tablet, Take 1 tablet (10 mg total) by mouth 2 (two) times daily as needed for muscle spasms., Disp: 20 tablet, Rfl: 2 .  dexlansoprazole (DEXILANT) 60 MG capsule, Take 1 capsule (60 mg total) by mouth daily., Disp: 90 capsule, Rfl: 3 .  EQ ALLERGY RELIEF 10 MG tablet, TAKE ONE TABLET BY MOUTH ONCE DAILY, Disp: 30 tablet, Rfl: 11 .  famotidine (PEPCID) 20 MG tablet, Take 1 tablet (20 mg total) by mouth at bedtime., Disp: 90 tablet, Rfl: 3 .  fluticasone (FLONASE) 50 MCG/ACT nasal spray, USE ONE SPRAY(S) IN EACH NOSTRIL DAILY AS NEEDED FOR ALLERGIES OR RHINITIS., Disp: 16 g, Rfl: 11 .  gabapentin (NEURONTIN) 300 MG capsule, TAKE ONE TABLET BY MOUTH IN THE AFTERNOON AND 2 AT BEDTIME, Disp: 270 capsule, Rfl: 3 .  hydrocortisone (PROCTOCORT) 1 % CREA, Apply 1 application topically daily., Disp: 45 g, Rfl: 1 .  hydroxychloroquine (PLAQUENIL) 200 MG tablet, TAKE 1 TABLET BY MOUTH TWICE A DAY, Disp: 60 tablet, Rfl: 1 .  losartan-hydrochlorothiazide (HYZAAR) 50-12.5 MG tablet, Take 1 tablet by mouth daily., Disp: 90 tablet, Rfl: 3 .  meloxicam  (MOBIC) 7.5 MG tablet, TAKE 1 TABLET BY MOUTH EVERY DAY, Disp: 30 tablet, Rfl: 0 .  ondansetron (ZOFRAN) 4 MG tablet, TAKE ONE TABLET BY MOUTH EVERY 8 HOURS AS NEEDED FOR  NAUSEA  OR  VOMITING, Disp: 20 tablet, Rfl: 2 .  polyethylene glycol powder (GLYCOLAX/MIRALAX) powder, TAKE ONE CAPFUL (17 GRAMS) BY MOUTH DAILY., Disp: 850 g, Rfl: 3 .  potassium chloride (K-DUR,KLOR-CON) 10 MEQ tablet, Take 1 tablet (10 mEq total) by mouth daily., Disp: 90 tablet, Rfl: 3 .  simvastatin (ZOCOR) 20 MG tablet, Take 1 tablet (20 mg total) by mouth daily., Disp: 90 tablet, Rfl: 3 .  venlafaxine (EFFEXOR) 37.5 MG tablet, Take 1 tablet (37.5 mg total) by mouth daily., Disp: 90 tablet, Rfl: 1 .  Vitamin D, Ergocalciferol, (DRISDOL) 50000 units CAPS capsule, Take 1 capsule (50,000 Units total) by mouth every 7 (seven) days. DISCONTINUE THE 50K WEEKLY DOSE, she is taing the 1000u daily dose (Patient not taking: Reported on 01/01/2018), Disp: 1 capsule, Rfl: 0    Allergies  Allergen Reactions  . Kiwi Extract Hives and Itching  . Mucinex Dm [Dm-Guaifenesin Er] Nausea Only    Messes up her stomach. Pt report on 01/21/15  . Peach [Prunus Persica] Itching    Peach peeling makes pt itch    Past Medical History:  Diagnosis Date  . Allergy   . Anemia    iron therapy for years as of 10/12; normal Hgb 08/2013  .  Arthritis   . Chest pain 04/05/2011   cardiac eval, normal treadmill stress test, Dr. Tollie Eth  . Chronic back pain   . Constipation   . Farsightedness    wears glasses, Eye care center  . 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 [name], [facility]  . GERD (gastroesophageal reflux disease)   . History of uterine fibroid   . Hyperlipidemia   . Hypertension   . Paresthesia 09/2014   initially thought to be TIA, neurology consult in 12/2014 with other non TIA considerations.    . Polyarthralgia    normal rheumatoid screen 01/2012     Past Surgical History:   Procedure Laterality Date  . COLONOSCOPY  01/2014   diverticulosis, othwerise normal - Dr. Owens Loffler  . ESOPHAGOGASTRODUODENOSCOPY  2013   Dr. Benson Norway, gastritis  . ESOPHAGOGASTRODUODENOSCOPY  [id number]  . EUS N/A 04/16/2014   Procedure: UPPER ENDOSCOPIC ULTRASOUND (EUS) LINEAR;  Surgeon: Milus Banister, MD;  Location: [facility];  Service: Endoscopy;  Laterality: N/A;  . gall stone surgery    . KNEE ARTHROSCOPY Left   . LAPAROSCOPIC GASTRIC RESECTION N/A 06/09/2014   Procedure: LAPAROSCOPIC GASTRIC MASS RESECTION;  Surgeon: [name], MD;  Location: [facility];  Service: General;  Laterality: N/A;  . LIPOMA EXCISION     forehead  . UTERINE FIBROID SURGERY      Objective:   Vitals: BP 123/80 (BP Location: Right Arm)   Pulse 73   Temp 97.7 F (36.5 C) (Oral)   LMP 02/23/2015   SpO2 99%   Physical Exam Constitutional:      General: She is not in acute distress.    Appearance: Normal appearance. She is well-developed. She is obese. She is not ill-appearing, toxic-appearing or diaphoretic.  HENT:     Head: Normocephalic and atraumatic.  Eyes:     Extraocular Movements: Extraocular movements intact.     Pupils: Pupils are equal, round, and reactive to light.  Cardiovascular:     Rate and Rhythm: Normal rate.  Pulmonary:     Effort: Pulmonary effort is normal.  Musculoskeletal:     Left ankle: She exhibits decreased range of motion and swelling. Tenderness. Lateral malleolus and AITFL tenderness found. Achilles tendon exhibits no pain, no defect and normal Thompson's test results.  Neurological:     Mental Status: She is alert and oriented to person, place, and time.  Psychiatric:        Mood and Affect: Mood normal.        Behavior: Behavior normal.        Thought Content: Thought content normal.    Dg Ankle Complete Left  Result Date: 04/01/2018 CLINICAL DATA:  Lateral left ankle pain following a fall. EXAM: LEFT ANKLE COMPLETE - 3+ VIEW COMPARISON:  None. FINDINGS:  Diffuse lateral soft tissue swelling. Possible small avulsion fracture off the distal aspect of the lateral malleolus on the frontal view. Otherwise, no fractures are seen. No effusion. Mild inferior calcaneal spur formation. Mild distal medial malleolus spur formation. IMPRESSION: 1. Possible small avulsion fracture off the distal aspect of the lateral malleolus. 2. Mild degenerative changes. Electronically Signed   By: Claudie Revering M.D.   On: 04/01/2018 17:11     Assessment and Plan :   Closed avulsion fracture of left ankle, initial encounter  Sprain of left ankle, unspecified ligament, initial encounter  Acute left ankle pain  Will place patient in a cam walker boot given her pain, avulsion  fracture. She is to contact her ortho doc for further management. Use meloxicam for pain and inflammation. Use crutches, non-weight bearing until ortho evaluates her. Return-to-clinic precautions discussed, patient verbalized understanding.     Jaynee Eagles, PA-C 04/01/18 1726

## 2018-04-01 NOTE — ED Triage Notes (Signed)
Pt present that she fall and injury her left ankle. Pt fall today .

## 2018-04-04 ENCOUNTER — Ambulatory Visit (INDEPENDENT_AMBULATORY_CARE_PROVIDER_SITE_OTHER): Payer: BLUE CROSS/BLUE SHIELD | Admitting: Orthopaedic Surgery

## 2018-04-04 ENCOUNTER — Encounter (INDEPENDENT_AMBULATORY_CARE_PROVIDER_SITE_OTHER): Payer: Self-pay | Admitting: Orthopaedic Surgery

## 2018-04-04 ENCOUNTER — Encounter (INDEPENDENT_AMBULATORY_CARE_PROVIDER_SITE_OTHER): Payer: Self-pay

## 2018-04-04 ENCOUNTER — Ambulatory Visit (INDEPENDENT_AMBULATORY_CARE_PROVIDER_SITE_OTHER): Payer: Self-pay

## 2018-04-04 VITALS — BP 88/58 | HR 100 | Temp 101.0°F | Resp 14 | Ht 69.0 in | Wt 215.0 lb

## 2018-04-04 DIAGNOSIS — M71122 Other infective bursitis, left elbow: Secondary | ICD-10-CM | POA: Diagnosis not present

## 2018-04-04 DIAGNOSIS — M25522 Pain in left elbow: Secondary | ICD-10-CM

## 2018-04-04 MED ORDER — DOXYCYCLINE HYCLATE 100 MG PO CAPS: 100.0000 mg | ORAL_CAPSULE | Freq: Two times a day (BID) | ORAL | 0 refills | Status: DC

## 2018-04-04 NOTE — Progress Notes (Signed)
Office Visit Note   [name]           Date of Birth: 06-May-1960           MRN: [id number] Visit Date: 04/04/2018              Requested by: Carlena Hurl, PA-C [address] [place], [place] [address] PCP: Carlena Hurl, PA-C   Assessment & Plan: Visit Diagnoses:  1. Septic olecranon bursitis of left elbow   2. Pain in left elbow     Plan: [place] last week injuring her left ankle.  Seen at a local emergency room with negative films and placed an equalizer boot.  Also fell directly on her left elbow.  Films reveal an olecranon spur that may a fracture.  Clinically there is evidence of an abscess with cellulitis.  I aspirated the olecranon bursa with gross purulence this was sent for culture and sensitivity and then I proceeded with a formal irrigation and debridement  I removed any further purulence from the olecranon bursa and packed it with iodoform gauze.  I placed her in a bulky dressing and is sling.  She will be on doxycycline 100 mg p.o. twice daily and will check her again in the morning  Follow-Up Instructions: Return tomorrow.   Orders:  Orders Placed This Encounter  Procedures  . Incision & Drainage  . Anaerobic and Aerobic Culture  . XR Elbow 2 Views Left   Meds ordered this encounter  Medications  . doxycycline (VIBRAMYCIN) 100 MG capsule    Sig: Take 1 capsule (100 mg total) by mouth 2 (two) times daily.    Dispense:  20 capsule    Refill:  0    Order Specific Question:   Supervising Provider    Answer:   Garald Balding [[id number]]      Procedures: Incision & Drainage Date/Time: 04/04/2018 4:51 PM Performed by: Garald Balding, MD Authorized by: Garald Balding, MD   Consent:    Consent obtained:  Verbal   Consent given by:  Patient   Risks discussed:  Pain Location:    Type:  Bursa   Location:  Upper extremity   Upper extremity location:  Elbow   Elbow location:  L elbow Pre-procedure details:    Skin preparation:   Alcohol and Betadine Anesthesia (see MAR for exact dosages):    Anesthesia method:  Local infiltration   Local anesthetic:  Lidocaine 1% w/o epi Procedure type:    Complexity:  Complex Procedure details:    Needle aspiration: yes     Needle size:  25 G   Incision depth:  Subfascial   Scalpel blade:  10   Wound management:  Probed and deloculated   Drainage:  Purulent   Drainage amount:  Moderate   Wound treatment:  Drain placed   Packing materials:  1/4 in iodoform gauze Post-procedure details:    Patient tolerance of procedure:  Tolerated well, no immediate complications Comments:     Pus sent for C&S     Clinical Data: No additional findings.   Subjective: Chief Complaint  Patient presents with  . Left Ankle - Injury, Pain  . Left Elbow - Pain, Injury  Lisa Briggs present to the office today after falling on Monday. Patient states she was taking down Christmas decorations when she fell. Patient went to the urgent care on Monday and an xray of her left ankle was obtained, patient was placed in Aircast boot. Xray revealed  Possible small avulsion fracture off the distal aspect of the lateral malleolus. Patient states her left elbow started swelling yesterday and became painful.   HPI  Review of Systems  Constitutional: Negative for fatigue and fever.  HENT: Negative for sore throat and tinnitus.   Eyes: Negative for pain and redness.  Respiratory: Negative for shortness of breath and wheezing.   Gastrointestinal: Negative for abdominal pain, blood in stool and constipation.  Endocrine: Negative for polyuria.  Genitourinary: Negative for dysuria and urgency.  Musculoskeletal: Negative for back pain.  Skin: Negative for rash.  Allergic/Immunologic: Negative for immunocompromised state.  Neurological: Negative for dizziness, weakness, numbness and headaches.  Hematological: Does not bruise/bleed easily.  Psychiatric/Behavioral: Negative for confusion. The patient is not  nervous/anxious.      Objective: Vital Signs: BP (!) 88/58 (BP Location: Right Arm, Patient Position: Sitting, Cuff Size: Normal)   Pulse 100   Temp (!) 101 F (38.3 C)   Resp 14   Ht 5\' 9"  (1.753 m)   Wt 215 lb (97.5 kg) Comment: per patient.  LMP 02/23/2015   BMI 31.75 kg/m   Physical Exam Constitutional:      Appearance: She is well-developed.  Eyes:     Pupils: Pupils are equal, round, and reactive to light.  Pulmonary:     Effort: Pulmonary effort is normal.  Skin:    General: Skin is warm and dry.  Neurological:     Mental Status: She is alert and oriented to person, place, and time.  Psychiatric:        Behavior: Behavior normal.     Ortho Exam awake alert and oriented x3.  Left ankle in equalizer boot and not removed.  Good capillary refill to toes.  Left elbow with swelling.  There appears to be a fair amount of cellulitis around the elbow with multiple areas of tenderness but mostly over the olecranon.  There is fluctuance.  There is some surrounding cellulitis but nothing above the distal arm.  No shoulder pain.  I aspirated the olecranon bursa with positive purulence and then proceeded with a formal I and D  Specialty Comments:  No specialty comments available.  Imaging: Xr Elbow 2 Views Left  Result Date: 04/04/2018 Films of the left elbow were obtained in several projections.  There were no acute changes or evidence of fracture.  Negative fat pad sign    PMFS History: Patient Active Problem List   Diagnosis Date Noted  . Leg swelling 12/05/2017  . Need for shingles vaccine 12/05/2017  . Primary osteoarthritis of both hands 11/23/2017  . Rheumatoid factor positive 11/23/2017  . Primary osteoarthritis of both knees 11/23/2017  . Primary osteoarthritis of both feet 11/23/2017  . DDD (degenerative disc disease), lumbar 11/23/2017  . Vaccine counseling 08/13/2017  . Polyarthralgia 08/13/2017  . Joint stiffness 08/13/2017  . Sensitive skin 01/23/2017    . Need for influenza vaccination 01/23/2017  . Proteinuria 01/23/2017  . History of gastrointestinal stromal tumor (GIST) 04/20/2016  . History of TIA (transient ischemic attack) 04/20/2016  . Screening for breast cancer 04/20/2016  . Estrogen deficiency 04/20/2016  . Chronic maxillary sinusitis 04/20/2016  . Impaired fasting blood sugar 04/20/2016  . Constipation 04/20/2016  . History of fall 04/20/2016  . Chronic radicular lumbar pain 01/27/2016  . Encounter for health maintenance examination in adult 03/22/2015  . Paresthesia 03/22/2015  . Cognitive decline 03/22/2015  . Screening for cervical cancer 03/22/2015  . Vitamin D deficiency 03/22/2015  . History of  uterine leiomyoma 03/22/2015  . Gastroesophageal reflux disease without esophagitis 03/02/2014  . Chronic nausea 03/02/2014  . Chronic abdominal pain 03/02/2014  . Rhinitis, allergic 03/02/2014  . Essential hypertension 03/02/2014  . Hyperlipidemia 03/02/2014  . Class 1 obesity with serious comorbidity in adult 01/16/2012   Past Medical History:  Diagnosis Date  . Allergy   . Anemia    iron therapy for years as of 10/12; normal Hgb 08/2013  . Arthritis   . Chest pain 04/05/2011   cardiac eval, normal treadmill stress test, Dr. Tollie Eth  . Chronic back pain   . Constipation   . Farsightedness    wears glasses, Eye care center  . 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 [name], [facility] Surgery  . GERD (gastroesophageal reflux disease)   . History of uterine fibroid   . Hyperlipidemia   . Hypertension   . Paresthesia 09/2014   initially thought to be TIA, neurology consult in 12/2014 with other non TIA considerations.    . Polyarthralgia    normal rheumatoid screen 01/2012    Family History  Problem Relation Age of Onset  . Hypertension Mother   . Arthritis Mother   . GER disease Mother   . Glaucoma Mother   . Breast cancer Mother   . Stroke Father   . Breast cancer  Sister        breast cancer dx late 12s  . Hypertension Sister   . Colon cancer Brother 9  . Diabetes Brother   . Diabetes Maternal Aunt   . Heart disease Maternal Aunt   . Heart disease Maternal Grandmother   . Heart disease Maternal Grandfather   . Heart disease Maternal Uncle   . Stroke Maternal Uncle   . Leukemia Sister   . Hypertension Sister   . Healthy Son   . Colon polyps Neg Hx     Past Surgical History:  Procedure Laterality Date  . COLONOSCOPY  01/2014   diverticulosis, othwerise normal - Dr. Owens Loffler  . ESOPHAGOGASTRODUODENOSCOPY  2013   Dr. Benson Norway, gastritis  . ESOPHAGOGASTRODUODENOSCOPY  [id number]  . EUS N/A 04/16/2014   Procedure: UPPER ENDOSCOPIC ULTRASOUND (EUS) LINEAR;  Surgeon: Milus Banister, MD;  Location: [facility];  Service: Endoscopy;  Laterality: N/A;  . gall stone surgery    . KNEE ARTHROSCOPY Left   . LAPAROSCOPIC GASTRIC RESECTION N/A 06/09/2014   Procedure: LAPAROSCOPIC GASTRIC MASS RESECTION;  Surgeon: [name], MD;  Location: [facility];  Service: General;  Laterality: N/A;  . LIPOMA EXCISION     forehead  . UTERINE FIBROID SURGERY     Social History   Occupational History  . Occupation: IT sales professional: [employer]  Tobacco Use  . Smoking status: Never Smoker  . Smokeless tobacco: Never Used  Substance and Sexual Activity  . Alcohol use: No    Alcohol/week: 0.0 standard drinks  . Drug use: No  . Sexual activity: Not on file

## 2018-04-05 ENCOUNTER — Ambulatory Visit (INDEPENDENT_AMBULATORY_CARE_PROVIDER_SITE_OTHER): Payer: BLUE CROSS/BLUE SHIELD | Admitting: Orthopaedic Surgery

## 2018-04-05 ENCOUNTER — Ambulatory Visit (INDEPENDENT_AMBULATORY_CARE_PROVIDER_SITE_OTHER): Payer: BLUE CROSS/BLUE SHIELD | Admitting: Physician Assistant

## 2018-04-05 ENCOUNTER — Encounter (INDEPENDENT_AMBULATORY_CARE_PROVIDER_SITE_OTHER): Payer: Self-pay | Admitting: Orthopaedic Surgery

## 2018-04-05 ENCOUNTER — Encounter: Payer: Self-pay | Admitting: Physician Assistant

## 2018-04-05 VITALS — BP 85/58 | HR 104 | Temp 98.7°F | Resp 15 | Ht 69.0 in | Wt 215.0 lb

## 2018-04-05 DIAGNOSIS — M7022 Olecranon bursitis, left elbow: Secondary | ICD-10-CM

## 2018-04-05 DIAGNOSIS — K219 Gastro-esophageal reflux disease without esophagitis: Secondary | ICD-10-CM

## 2018-04-05 DIAGNOSIS — M51369 Other intervertebral disc degeneration, lumbar region without mention of lumbar back pain or lower extremity pain: Secondary | ICD-10-CM

## 2018-04-05 DIAGNOSIS — M19041 Primary osteoarthritis, right hand: Secondary | ICD-10-CM

## 2018-04-05 DIAGNOSIS — M0579 Rheumatoid arthritis with rheumatoid factor of multiple sites without organ or systems involvement: Secondary | ICD-10-CM

## 2018-04-05 DIAGNOSIS — M5136 Other intervertebral disc degeneration, lumbar region: Secondary | ICD-10-CM

## 2018-04-05 DIAGNOSIS — M170 Bilateral primary osteoarthritis of knee: Secondary | ICD-10-CM

## 2018-04-05 DIAGNOSIS — Z8673 Personal history of transient ischemic attack (TIA), and cerebral infarction without residual deficits: Secondary | ICD-10-CM

## 2018-04-05 DIAGNOSIS — E559 Vitamin D deficiency, unspecified: Secondary | ICD-10-CM

## 2018-04-05 DIAGNOSIS — M19042 Primary osteoarthritis, left hand: Secondary | ICD-10-CM

## 2018-04-05 DIAGNOSIS — M71122 Other infective bursitis, left elbow: Secondary | ICD-10-CM

## 2018-04-05 DIAGNOSIS — S82892D Other fracture of left lower leg, subsequent encounter for closed fracture with routine healing: Secondary | ICD-10-CM

## 2018-04-05 DIAGNOSIS — Z8639 Personal history of other endocrine, nutritional and metabolic disease: Secondary | ICD-10-CM

## 2018-04-05 DIAGNOSIS — Z79899 Other long term (current) drug therapy: Secondary | ICD-10-CM

## 2018-04-05 DIAGNOSIS — M19072 Primary osteoarthritis, left ankle and foot: Secondary | ICD-10-CM

## 2018-04-05 DIAGNOSIS — I10 Essential (primary) hypertension: Secondary | ICD-10-CM

## 2018-04-05 DIAGNOSIS — M19071 Primary osteoarthritis, right ankle and foot: Secondary | ICD-10-CM

## 2018-04-05 LAB — COMPLETE METABOLIC PANEL WITH GFR
AG Ratio: 1.3 (calc) (ref 1.0–2.5)
ALT: 22 U/L (ref 6–29)
AST: 20 U/L (ref 10–35)
Albumin: 3.4 g/dL — ABNORMAL LOW (ref 3.6–5.1)
Alkaline phosphatase (APISO): 93 U/L (ref 33–130)
BUN: 14 mg/dL (ref 7–25)
CO2: 27 mmol/L (ref 20–32)
Calcium: 8.7 mg/dL (ref 8.6–10.4)
Chloride: 102 mmol/L (ref 98–110)
Creat: 0.89 mg/dL (ref 0.50–1.05)
GFR, Est African American: 85 mL/min/{1.73_m2} (ref >=60)
GFR, Est Non African American: 73 mL/min/{1.73_m2} (ref >=60)
Globulin: 2.6 g/dL (calc) (ref 1.9–3.7)
Glucose, Bld: 156 mg/dL — ABNORMAL HIGH (ref 65–99)
Potassium: 3.2 mmol/L — ABNORMAL LOW (ref 3.5–5.3)
Sodium: 140 mmol/L (ref 135–146)
Total Bilirubin: 0.7 mg/dL (ref 0.2–1.2)
Total Protein: 6 g/dL — ABNORMAL LOW (ref 6.1–8.1)

## 2018-04-05 LAB — CBC WITH DIFFERENTIAL/PLATELET
Absolute Monocytes: 900 cells/uL (ref 200–950)
Basophils Absolute: 50 cells/uL (ref 0–200)
Basophils Relative: 0.2 %
Eosinophils Absolute: 25 cells/uL (ref 15–500)
Eosinophils Relative: 0.1 %
HCT: 35.4 % (ref 35.0–45.0)
Hemoglobin: 11 g/dL — ABNORMAL LOW (ref 11.7–15.5)
Lymphs Abs: 1400 cells/uL (ref 850–3900)
MCH: 26.4 pg — ABNORMAL LOW (ref 27.0–33.0)
MCHC: 31.1 g/dL — ABNORMAL LOW (ref 32.0–36.0)
MCV: 85.1 fL (ref 80.0–100.0)
MPV: 10.4 fL (ref 7.5–12.5)
Monocytes Relative: 3.6 %
Neutro Abs: 22625 cells/uL — ABNORMAL HIGH (ref 1500–7800)
Neutrophils Relative %: 90.5 %
Platelets: 288 10*3/uL (ref 140–400)
RBC: 4.16 10*6/uL (ref 3.80–5.10)
RDW: 12.4 % (ref 11.0–15.0)
Total Lymphocyte: 5.6 %
WBC: 25 10*3/uL — ABNORMAL HIGH (ref 3.8–10.8)

## 2018-04-05 NOTE — Progress Notes (Signed)
Office Visit Note   [name]           Date of Birth: Jun 29, 1960           MRN: [id number] Visit Date: 04/05/2018              Requested by: Carlena Hurl, PA-C [address] [place], [place] [address] PCP: Carlena Hurl, PA-C   Assessment & Plan: Visit Diagnoses:  1. Olecranon bursitis of left elbow     Plan: Lisa Briggs was seen yesterday for evaluation of an infected left olecranon bursa.  I performed a formal irrigation and debridement and retrieved purulence.  The culture report is not available yet.  I placed her on doxycycline 100 mg p.o. twice daily.  I had her come back today to check her wound and her arm.  There is much less swelling and erythema and she is much more comfortable.  Afebrile.  I advanced the packing, reapplied another soft dressing with her sling and plan to see her back on Monday  Follow-Up Instructions: Return Monday.   Orders:  No orders of the defined types were placed in this encounter.  No orders of the defined types were placed in this encounter.     Procedures: No procedures performed   Clinical Data: No additional findings.   Subjective: No chief complaint on file. Lisa Briggs was seen yesterday for evaluation of a painful left elbow.  She had an infected olecranon bursa.  I performed an irrigation debridement and evacuated significant purulence.  Culture and sensitivity is not available yet.  I placed her on doxycycline 100 mg p.o. twice daily.  She is afebrile this morning and feeling much better.  HPI  Review of Systems   Objective: Vital Signs: LMP 02/23/2015   Physical Exam  Ortho Exam awake alert and oriented x3.  Comfortable sitting.  Seems much more comfortable today.  I remove the dressing from the left elbow.  Much less edema and I did not see any erythema.  There is some purulence this was advanced.  Significantly less areas of tenderness to touch.  No ascending dose.  Temp 98 7.  Mild swelling of  her fingers probably from the dressing was applied much more loosely. neurovascular exam intact  Specialty Comments:  No specialty comments available.  Imaging: Xr Elbow 2 Views Left  Result Date: 04/04/2018 Films of the left elbow were obtained in several projections.  There were no acute changes or evidence of fracture.  Negative fat pad sign    PMFS History: Patient Active Problem List   Diagnosis Date Noted  . Olecranon bursitis of left elbow 04/05/2018  . Leg swelling 12/05/2017  . Need for shingles vaccine 12/05/2017  . Primary osteoarthritis of both hands 11/23/2017  . Rheumatoid factor positive 11/23/2017  . Primary osteoarthritis of both knees 11/23/2017  . Primary osteoarthritis of both feet 11/23/2017  . DDD (degenerative disc disease), lumbar 11/23/2017  . Vaccine counseling 08/13/2017  . Polyarthralgia 08/13/2017  . Joint stiffness 08/13/2017  . Sensitive skin 01/23/2017  . Need for influenza vaccination 01/23/2017  . Proteinuria 01/23/2017  . History of gastrointestinal stromal tumor (GIST) 04/20/2016  . History of TIA (transient ischemic attack) 04/20/2016  . Screening for breast cancer 04/20/2016  . Estrogen deficiency 04/20/2016  . Chronic maxillary sinusitis 04/20/2016  . Impaired fasting blood sugar 04/20/2016  . Constipation 04/20/2016  . History of fall 04/20/2016  . Chronic radicular lumbar pain 01/27/2016  . Encounter for  health maintenance examination in adult 03/22/2015  . Paresthesia 03/22/2015  . Cognitive decline 03/22/2015  . Screening for cervical cancer 03/22/2015  . Vitamin D deficiency 03/22/2015  . History of uterine leiomyoma 03/22/2015  . Gastroesophageal reflux disease without esophagitis 03/02/2014  . Chronic nausea 03/02/2014  . Chronic abdominal pain 03/02/2014  . Rhinitis, allergic 03/02/2014  . Essential hypertension 03/02/2014  . Hyperlipidemia 03/02/2014  . Class 1 obesity with serious comorbidity in adult 01/16/2012    Past Medical History:  Diagnosis Date  . Allergy   . Anemia    iron therapy for years as of 10/12; normal Hgb 08/2013  . Arthritis   . Chest pain 04/05/2011   cardiac eval, normal treadmill stress test, Dr. Tollie Eth  . Chronic back pain   . Constipation   . Farsightedness    wears glasses, Eye care center  . 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 [name], [facility] Surgery  . GERD (gastroesophageal reflux disease)   . History of uterine fibroid   . Hyperlipidemia   . Hypertension   . Paresthesia 09/2014   initially thought to be TIA, neurology consult in 12/2014 with other non TIA considerations.    . Polyarthralgia    normal rheumatoid screen 01/2012    Family History  Problem Relation Age of Onset  . Hypertension Mother   . Arthritis Mother   . GER disease Mother   . Glaucoma Mother   . Breast cancer Mother   . Stroke Father   . Breast cancer Sister        breast cancer dx late 71s  . Hypertension Sister   . Colon cancer Brother 31  . Diabetes Brother   . Diabetes Maternal Aunt   . Heart disease Maternal Aunt   . Heart disease Maternal Grandmother   . Heart disease Maternal Grandfather   . Heart disease Maternal Uncle   . Stroke Maternal Uncle   . Leukemia Sister   . Hypertension Sister   . Healthy Son   . Colon polyps Neg Hx     Past Surgical History:  Procedure Laterality Date  . COLONOSCOPY  01/2014   diverticulosis, othwerise normal - Dr. Owens Loffler  . ESOPHAGOGASTRODUODENOSCOPY  2013   Dr. Benson Norway, gastritis  . ESOPHAGOGASTRODUODENOSCOPY  [id number]  . EUS N/A 04/16/2014   Procedure: UPPER ENDOSCOPIC ULTRASOUND (EUS) LINEAR;  Surgeon: Milus Banister, MD;  [facility];  Service: Endoscopy;  Laterality: N/A;  . gall stone surgery    . KNEE ARTHROSCOPY Left   . LAPAROSCOPIC GASTRIC RESECTION N/A 06/09/2014   Procedure: LAPAROSCOPIC GASTRIC MASS RESECTION;  Surgeon: [name], MD;  Location: [facility];   Service: General;  Laterality: N/A;  . LIPOMA EXCISION     forehead  . UTERINE FIBROID SURGERY     Social History   Occupational History  . Occupation: IT sales professional: [employer]  Tobacco Use  . Smoking status: Never Smoker  . Smokeless tobacco: Never Used  Substance and Sexual Activity  . Alcohol use: No    Alcohol/week: 0.0 standard drinks  . Drug use: No  . Sexual activity: Not on file     Garald Balding, MD   Note - This record has been created using [employer].  Chart creation errors have been sought, but may not always  have been located. Such creation errors do not reflect on  the standard of medical care.

## 2018-04-08 ENCOUNTER — Telehealth: Payer: Self-pay | Admitting: Orthopaedic Surgery

## 2018-04-08 ENCOUNTER — Ambulatory Visit (INDEPENDENT_AMBULATORY_CARE_PROVIDER_SITE_OTHER): Payer: BLUE CROSS/BLUE SHIELD | Admitting: Orthopaedic Surgery

## 2018-04-08 ENCOUNTER — Encounter (INDEPENDENT_AMBULATORY_CARE_PROVIDER_SITE_OTHER): Payer: Self-pay | Admitting: Orthopaedic Surgery

## 2018-04-08 VITALS — BP 114/90 | HR 76 | Temp 97.9°F | Ht 69.0 in | Wt 215.0 lb

## 2018-04-08 DIAGNOSIS — M71122 Other infective bursitis, left elbow: Secondary | ICD-10-CM

## 2018-04-08 DIAGNOSIS — M79672 Pain in left foot: Secondary | ICD-10-CM | POA: Diagnosis not present

## 2018-04-08 NOTE — Telephone Encounter (Signed)
Dr. Durward Fortes is aware

## 2018-04-08 NOTE — Telephone Encounter (Signed)
Stacy from [employer] left a voicemail stating she had urgent test results for patient.  Please call back at [phone]  Ref #[id number]

## 2018-04-08 NOTE — Progress Notes (Signed)
Office Visit Note   [name]           Date of Birth: Apr 08, 1960           MRN: [id number] Visit Date: 04/08/2018              Requested by: Carlena Hurl, PA-C [address] [place], [place] [address] PCP: Carlena Hurl, PA-C   Assessment & Plan: Visit Diagnoses:  1. Left foot pain   2. Septic olecranon bursitis, left     Plan: Status post irrigation and debridement of infected left olecranon bursitis last week.  Cultures are growing staph aureus and possibly a gram-negative bacillus.  Has been on doxycycline.  Dressing removed and very minimal purulent drainage.  Swelling is down.  Some mild erythema around the elbow.  Neurologically intact.  Not having the pain that she had previously.  Awaiting culture sensitivities  Also appears to have had an injury to the lateral left ankle time of her fall x-rays were negative for fracture.  Suspect she is got a sprain of the lateral ankle and will continue with the equalizer boot.  Office the end of the week  Follow-Up Instructions: Return in about 3 days (around 04/11/2018).   Orders:  No orders of the defined types were placed in this encounter.  No orders of the defined types were placed in this encounter.     Procedures: No procedures performed   Clinical Data: No additional findings.   Subjective: Chief Complaint  Patient presents with  . Elbow Pain    follow up--still painful/drainage/redness  Seen in the end of last week for evaluation of injury she sustained in a fall.  Has a sprain of her lateral left ankle and wearing equalizer boot.  Comfortable and can walk without the use of a crutch.  Also had evidence of an infected left olecranon bursa.  I performed an I&D with gross purulence.  To date there is gram positive cocci consistent with staph and a few gram negative bacilli.  In September these are not available yet.  Has been taking doxycycline  HPI  Review of Systems  Constitutional:  Negative.   HENT: Negative.   Eyes: Negative.   Respiratory: Negative.   Cardiovascular: Negative.   Gastrointestinal: Negative.   Endocrine: Negative.   Genitourinary: Negative.   Musculoskeletal: Positive for gait problem.  Skin: Positive for color change and wound.  Allergic/Immunologic: Negative.   Hematological: Negative.   Psychiatric/Behavioral: Negative.      Objective: Vital Signs: BP 114/90 (BP Location: Right Arm, Patient Position: Sitting, Cuff Size: Normal)   Pulse 76   Temp 97.9 F (36.6 C)   Ht 5\' 9"  (1.753 m)   Wt 215 lb (97.5 kg)   LMP 02/23/2015   BMI 31.75 kg/m   Physical Exam  Ortho Exam dressing removed from left elbow.  Remaining iodoform gauze removed as a wick.  Still has some edema around the elbow but erythema has resolved and no longer having the pain that she had.  Good grip and good release.  Still having some mild swelling of her hand which could be related to the sling.  Equalizer boot removed from left ankle.  Has some swelling and ecchymosis laterally.  Some pain over the distal fibula and over the anterior talofibular ligament.  No instability.  Films are reviewed without evidence of obvious fracture  Specialty Comments:  No specialty comments available.  Imaging: No results found.   PMFS History: Patient  Active Problem List   Diagnosis Date Noted  . Left foot pain 04/08/2018  . Septic olecranon bursitis, left 04/05/2018  . Leg swelling 12/05/2017  . Need for shingles vaccine 12/05/2017  . Primary osteoarthritis of both hands 11/23/2017  . Rheumatoid factor positive 11/23/2017  . Primary osteoarthritis of both knees 11/23/2017  . Primary osteoarthritis of both feet 11/23/2017  . DDD (degenerative disc disease), lumbar 11/23/2017  . Vaccine counseling 08/13/2017  . Polyarthralgia 08/13/2017  . Joint stiffness 08/13/2017  . Sensitive skin 01/23/2017  . Need for influenza vaccination 01/23/2017  . Proteinuria 01/23/2017  .  History of gastrointestinal stromal tumor (GIST) 04/20/2016  . History of TIA (transient ischemic attack) 04/20/2016  . Screening for breast cancer 04/20/2016  . Estrogen deficiency 04/20/2016  . Chronic maxillary sinusitis 04/20/2016  . Impaired fasting blood sugar 04/20/2016  . Constipation 04/20/2016  . History of fall 04/20/2016  . Chronic radicular lumbar pain 01/27/2016  . Encounter for health maintenance examination in adult 03/22/2015  . Paresthesia 03/22/2015  . Cognitive decline 03/22/2015  . Screening for cervical cancer 03/22/2015  . Vitamin D deficiency 03/22/2015  . History of uterine leiomyoma 03/22/2015  . Gastroesophageal reflux disease without esophagitis 03/02/2014  . Chronic nausea 03/02/2014  . Chronic abdominal pain 03/02/2014  . Rhinitis, allergic 03/02/2014  . Essential hypertension 03/02/2014  . Hyperlipidemia 03/02/2014  . Class 1 obesity with serious comorbidity in adult 01/16/2012   Past Medical History:  Diagnosis Date  . Allergy   . Anemia    iron therapy for years as of 10/12; normal Hgb 08/2013  . Arthritis   . Chest pain 04/05/2011   cardiac eval, normal treadmill stress test, Dr. Tollie Eth  . Chronic back pain   . Constipation   . Farsightedness    wears glasses, Eye care center  . 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 [name], [facility] Surgery  . GERD (gastroesophageal reflux disease)   . History of uterine fibroid   . Hyperlipidemia   . Hypertension   . Paresthesia 09/2014   initially thought to be TIA, neurology consult in 12/2014 with other non TIA considerations.    . Polyarthralgia    normal rheumatoid screen 01/2012    Family History  Problem Relation Age of Onset  . Hypertension Mother   . Arthritis Mother   . GER disease Mother   . Glaucoma Mother   . Breast cancer Mother   . Stroke Father   . Breast cancer Sister        breast cancer dx late 32s  . Hypertension Sister   . Colon  cancer Brother 65  . Diabetes Brother   . Diabetes Maternal Aunt   . Heart disease Maternal Aunt   . Heart disease Maternal Grandmother   . Heart disease Maternal Grandfather   . Heart disease Maternal Uncle   . Stroke Maternal Uncle   . Leukemia Sister   . Hypertension Sister   . Healthy Son   . Colon polyps Neg Hx     Past Surgical History:  Procedure Laterality Date  . COLONOSCOPY  01/2014   diverticulosis, othwerise normal - Dr. Owens Loffler  . ESOPHAGOGASTRODUODENOSCOPY  2013   Dr. Benson Norway, gastritis  . ESOPHAGOGASTRODUODENOSCOPY  [id number]  . EUS N/A 04/16/2014   Procedure: UPPER ENDOSCOPIC ULTRASOUND (EUS) LINEAR;  Surgeon: Milus Banister, MD;  [facility];  Service: Endoscopy;  Laterality: N/A;  . gall stone surgery    .  KNEE ARTHROSCOPY Left   . LAPAROSCOPIC GASTRIC RESECTION N/A 06/09/2014   Procedure: LAPAROSCOPIC GASTRIC MASS RESECTION;  Surgeon: [name], MD;  Location: [facility];  Service: General;  Laterality: N/A;  . LIPOMA EXCISION     forehead  . UTERINE FIBROID SURGERY     Social History   Occupational History  . Occupation: IT sales professional: [employer]  Tobacco Use  . Smoking status: Never Smoker  . Smokeless tobacco: Never Used  Substance and Sexual Activity  . Alcohol use: No    Alcohol/week: 0.0 standard drinks  . Drug use: No  . Sexual activity: Not on file

## 2018-04-09 ENCOUNTER — Other Ambulatory Visit: Payer: Self-pay | Admitting: Rheumatology

## 2018-04-09 ENCOUNTER — Telehealth: Payer: Self-pay | Admitting: Rheumatology

## 2018-04-09 NOTE — Progress Notes (Signed)
WBC count is elevated. She is currently on Doxycyline for treatment of septic olecranon bursitis of the left elbow.  Reviewed labs with Dr. Durward Fortes and Dr. Estanislado Pandy.  Glucose is  elevated-156.

## 2018-04-09 NOTE — Telephone Encounter (Signed)
Patient request work note to be faxed to [facility] # [phone], and [facility] # [phone]

## 2018-04-09 NOTE — Telephone Encounter (Signed)
Last Visit: 04/05/18 Next Visit: 09/04/18 Labs: 04/05/18 elevated glucose, Potassium 3.2, WBC 25 Hgb. 11.0 Neutro Abs 22, 625  PLQ Eye Exam: 12/18/17 WNL   Okay to refill PLQ?

## 2018-04-10 ENCOUNTER — Telehealth (INDEPENDENT_AMBULATORY_CARE_PROVIDER_SITE_OTHER): Payer: Self-pay | Admitting: Orthopaedic Surgery

## 2018-04-10 ENCOUNTER — Encounter: Payer: Self-pay | Admitting: *Deleted

## 2018-04-10 LAB — ANAEROBIC AND AEROBIC CULTURE
MICRO NUMBER:: 5619
MICRO NUMBER:: 7305
SPECIMEN QUALITY:: ADEQUATE
SPECIMEN QUALITY:: ADEQUATE

## 2018-04-10 NOTE — Telephone Encounter (Signed)
Faxed letter to [phone] and [phone], I called patient, original fax # to [facility] was incorrect, patient's cell # [phone].

## 2018-04-10 NOTE — Telephone Encounter (Signed)
Dr. Estanislado Pandy is ok with refilling PLQ.

## 2018-04-10 NOTE — Telephone Encounter (Signed)
Please call pt and complete work note-thanks

## 2018-04-10 NOTE — Telephone Encounter (Signed)
[facility] left a voicemail stating they have critical lab results for patient.  Please provide the following reference number #[id number] when returning our call at [phone]

## 2018-04-10 NOTE — Telephone Encounter (Signed)
Dr. Durward Fortes is aware, appt scheduled 04/11/2018

## 2018-04-11 ENCOUNTER — Ambulatory Visit (INDEPENDENT_AMBULATORY_CARE_PROVIDER_SITE_OTHER): Payer: BLUE CROSS/BLUE SHIELD | Admitting: Orthopaedic Surgery

## 2018-04-11 ENCOUNTER — Ambulatory Visit: Payer: BLUE CROSS/BLUE SHIELD | Admitting: Medical

## 2018-04-11 ENCOUNTER — Encounter (INDEPENDENT_AMBULATORY_CARE_PROVIDER_SITE_OTHER): Payer: Self-pay | Admitting: Orthopaedic Surgery

## 2018-04-11 VITALS — BP 120/78 | HR 70 | Temp 98.0°F | Resp 16 | Ht 69.0 in | Wt 215.0 lb

## 2018-04-11 VITALS — BP 132/86 | HR 86 | Temp 97.9°F | Wt 215.0 lb

## 2018-04-11 DIAGNOSIS — R7301 Impaired fasting glucose: Secondary | ICD-10-CM | POA: Diagnosis not present

## 2018-04-11 DIAGNOSIS — I10 Essential (primary) hypertension: Secondary | ICD-10-CM | POA: Diagnosis not present

## 2018-04-11 DIAGNOSIS — D72829 Elevated white blood cell count, unspecified: Secondary | ICD-10-CM

## 2018-04-11 DIAGNOSIS — M79672 Pain in left foot: Secondary | ICD-10-CM

## 2018-04-11 DIAGNOSIS — M71122 Other infective bursitis, left elbow: Secondary | ICD-10-CM

## 2018-04-11 LAB — CBC WITH DIFFERENTIAL/PLATELET
Basophils Absolute: 0.1 10*3/uL (ref 0.0–0.2)
Basos: 1 %
EOS (ABSOLUTE): 0.2 10*3/uL (ref 0.0–0.4)
Eos: 2 %
Hematocrit: 33.6 % — ABNORMAL LOW (ref 34.0–46.6)
Hemoglobin: 10.9 g/dL — ABNORMAL LOW (ref 11.1–15.9)
Lymphocytes Absolute: 3.1 10*3/uL (ref 0.7–3.1)
Lymphs: 34 %
MCH: 27 pg (ref 26.6–33.0)
MCHC: 32.4 g/dL (ref 31.5–35.7)
MCV: 83 fL (ref 79–97)
Monocytes Absolute: 0.6 10*3/uL (ref 0.1–0.9)
Monocytes: 6 %
Neutrophils Absolute: 5.2 10*3/uL (ref 1.4–7.0)
Neutrophils: 46 %
Platelets: 404 10*3/uL (ref 150–450)
RBC: 4.03 x10E6/uL (ref 3.77–5.28)
RDW: 13.1 % (ref 11.7–15.4)
WBC: 9.2 10*3/uL (ref 3.4–10.8)

## 2018-04-11 LAB — HEMOGLOBIN A1C
Est. average glucose Bld gHb Est-mCnc: 123 mg/dL
Hgb A1c MFr Bld: 5.9 % — ABNORMAL HIGH (ref 4.8–5.6)

## 2018-04-11 LAB — IMMATURE CELLS: Bands(Auto) Relative: 11 %

## 2018-04-11 MED ORDER — POTASSIUM CHLORIDE CRYS ER 10 MEQ PO TBCR: 10.0000 meq | EXTENDED_RELEASE_TABLET | Freq: Two times a day (BID) | ORAL | 3 refills | Status: DC

## 2018-04-11 MED ORDER — DOXYCYCLINE HYCLATE 100 MG PO CAPS: 100.0000 mg | ORAL_CAPSULE | Freq: Two times a day (BID) | ORAL | 0 refills | Status: DC

## 2018-04-11 NOTE — Progress Notes (Signed)
Office Visit Note   [name]           Date of Birth: June 03, 1960           MRN: [id number] Visit Date: 04/11/2018              Requested by: Carlena Hurl, PA-C [address] [place], [place] [address] PCP: Carlena Hurl, PA-C   Assessment & Plan: Visit Diagnoses:  1. Septic olecranon bursitis, left   2. Left foot pain     Plan: Doing quite well at present with very minimal discomfort in regards to her left upper extremity.  She had an infected olecranon bursa with heavy growth of staph aureus sensitive to the doxycycline.  She also had scant growth of Klebsiella and Proteus.  These were not sensitive to the doxycycline but she is definitely improving clinically.  No fever or chills.  No active drainage from the elbow.  Will apply a Band-Aid begin range of motion exercises in the sling and return in Monday.  I will renew her doxycycline a week.  She also sustained a sprain of the lateral aspect of her left ankle and is wearing an equalizer boot and that appears to be improving  Follow-Up Instructions: Return Mon.   Orders:  No orders of the defined types were placed in this encounter.  Meds ordered this encounter  Medications  . doxycycline (VIBRAMYCIN) 100 MG capsule    Sig: Take 1 capsule (100 mg total) by mouth 2 (two) times daily.    Dispense:  14 capsule    Refill:  0      Procedures: No procedures performed   Clinical Data: No additional findings.   Subjective: Chief Complaint  Patient presents with  . Left Shoulder - Follow-up  . Left Foot - Pain  . Follow-up    Pt stated Lt foot- still have redness, swollen.Aundra Millet shoulder--sore and little drainage.    HPI  Review of Systems  Constitutional: Negative.   HENT: Negative.   Eyes: Negative.   Respiratory: Negative.   Cardiovascular: Negative.   Gastrointestinal: Negative.   Endocrine: Negative.   Genitourinary: Negative.   Musculoskeletal: Positive for back pain and gait problem.   Skin: Positive for color change and wound.  Allergic/Immunologic: Negative.   Hematological: Negative.   Psychiatric/Behavioral: Negative.      Objective: Vital Signs: BP 132/86 (BP Location: Right Arm, Patient Position: Sitting, Cuff Size: Normal)   Pulse 86   Temp 97.9 F (36.6 C)   Wt 215 lb (97.5 kg)   LMP 02/23/2015   BMI 31.75 kg/m   Physical Exam  Ortho Exam awake alert and oriented x3.  Comfortable sitting.  Afebrile.  No obvious erythema about the left elbow.  Edema significantly less.  No gross purulent drainage.  Lacks about 20 degrees to full elbow extension.  Good grip and good release and no swelling of her hand.  Left ankle with tenderness about the lateral ankle and particularly the anterior talofibular ligament.  No instability.  No pain behind the malleolus or along the Achilles tendon.  Neurovascular exam intact.  We will continue with the equalizer boot and return on Monday  Specialty Comments:  No specialty comments available.  Imaging: No results found.   PMFS History: Patient Active Problem List   Diagnosis Date Noted  . Left foot pain 04/08/2018  . Septic olecranon bursitis, left 04/05/2018  . Leg swelling 12/05/2017  . Need for shingles vaccine 12/05/2017  .  Primary osteoarthritis of both hands 11/23/2017  . Rheumatoid factor positive 11/23/2017  . Primary osteoarthritis of both knees 11/23/2017  . Primary osteoarthritis of both feet 11/23/2017  . DDD (degenerative disc disease), lumbar 11/23/2017  . Vaccine counseling 08/13/2017  . Polyarthralgia 08/13/2017  . Joint stiffness 08/13/2017  . Sensitive skin 01/23/2017  . Need for influenza vaccination 01/23/2017  . Proteinuria 01/23/2017  . History of gastrointestinal stromal tumor (GIST) 04/20/2016  . History of TIA (transient ischemic attack) 04/20/2016  . Screening for breast cancer 04/20/2016  . Estrogen deficiency 04/20/2016  . Chronic maxillary sinusitis 04/20/2016  . Impaired  fasting blood sugar 04/20/2016  . Constipation 04/20/2016  . History of fall 04/20/2016  . Chronic radicular lumbar pain 01/27/2016  . Encounter for health maintenance examination in adult 03/22/2015  . Paresthesia 03/22/2015  . Cognitive decline 03/22/2015  . Screening for cervical cancer 03/22/2015  . Vitamin D deficiency 03/22/2015  . History of uterine leiomyoma 03/22/2015  . Gastroesophageal reflux disease without esophagitis 03/02/2014  . Chronic nausea 03/02/2014  . Chronic abdominal pain 03/02/2014  . Rhinitis, allergic 03/02/2014  . Essential hypertension 03/02/2014  . Hyperlipidemia 03/02/2014  . Class 1 obesity with serious comorbidity in adult 01/16/2012   Past Medical History:  Diagnosis Date  . Allergy   . Anemia    iron therapy for years as of 10/12; normal Hgb 08/2013  . Arthritis   . Chest pain 04/05/2011   cardiac eval, normal treadmill stress test, Dr. Tollie Eth  . Chronic back pain   . Constipation   . Farsightedness    wears glasses, [facility]  . 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 [name], [facility]  . GERD (gastroesophageal reflux disease)   . History of uterine fibroid   . Hyperlipidemia   . Hypertension   . Paresthesia 09/2014   initially thought to be TIA, neurology consult in 12/2014 with other non TIA considerations.    . Polyarthralgia    normal rheumatoid screen 01/2012    Family History  Problem Relation Age of Onset  . Hypertension Mother   . Arthritis Mother   . GER disease Mother   . Glaucoma Mother   . Breast cancer Mother   . Stroke Father   . Breast cancer Sister        breast cancer dx late 24s  . Hypertension Sister   . Colon cancer Brother 35  . Diabetes Brother   . Diabetes Maternal Aunt   . Heart disease Maternal Aunt   . Heart disease Maternal Grandmother   . Heart disease Maternal Grandfather   . Heart disease Maternal Uncle   . Stroke Maternal Uncle   . Leukemia  Sister   . Hypertension Sister   . Healthy Son   . Colon polyps Neg Hx     Past Surgical History:  Procedure Laterality Date  . COLONOSCOPY  01/2014   diverticulosis, othwerise normal - Dr. Owens Loffler  . ESOPHAGOGASTRODUODENOSCOPY  2013   Dr. Benson Norway, gastritis  . ESOPHAGOGASTRODUODENOSCOPY  [id number]  . EUS N/A 04/16/2014   Procedure: UPPER ENDOSCOPIC ULTRASOUND (EUS) LINEAR;  Surgeon: Milus Banister, MD;  Location: [facility];  Service: Endoscopy;  Laterality: N/A;  . gall stone surgery    . KNEE ARTHROSCOPY Left   . LAPAROSCOPIC GASTRIC RESECTION N/A 06/09/2014   Procedure: LAPAROSCOPIC GASTRIC MASS RESECTION;  Surgeon: [name], MD;  Location: [facility];  Service: General;  Laterality: N/A;  .  LIPOMA EXCISION     forehead  . UTERINE FIBROID SURGERY     Social History   Occupational History  . Occupation: IT sales professional: [employer]  Tobacco Use  . Smoking status: Never Smoker  . Smokeless tobacco: Never Used  Substance and Sexual Activity  . Alcohol use: No    Alcohol/week: 0.0 standard drinks  . Drug use: No  . Sexual activity: Not on file

## 2018-04-11 NOTE — Progress Notes (Signed)
Subjective: Chief Complaint  Patient presents with  . elevated blood sugar    elevated blood sugar non fasting.   fasting today   Here for elevated sugar.   Her blood sugar was found to be elevated last week but she says she was non fasting.  She does have a history of impaired fasting glucose.  She does not check sugars, no glucometer.  He denies polydipsia, polyuria, blurred vision.  She had an injury and fall in December and was seen in emergency department on 04/01/2018.  She had an avulsion fracture of her left ankle, but then went back to orthopedics on 04/04/2018 and was diagnosed with septic olecranon bursitis.  She had incision and drainage.  She was seen in follow-up on 04/08/2018 with Dr. Durward Fortes.  At that time she continued in equalizer boot of the left leg, she was using a crutch and she was using arm sling.  She notes that since the incision and drainage her arm does not feel feverish now on less swollen less painful.  She is compliant with doxycycline antibiotic.  She sees orthopedics again and recheck today at 1:30pm with Dr. Durward Fortes.  She recently was on step stool putting up Christmas decoration.   Ended up falling, landed on left side on porch.   Had injuries to left elbow and left leg.     She had labs recently that showed potassium slightly low.  She is compliant with K-Dur 10 mEq once daily.  Hypertension-compliant with losartan HCT 50/12.5 mg daily.  No other new c/o.    Past Medical History:  Diagnosis Date  . Allergy   . Anemia    iron therapy for years as of 10/12; normal Hgb 08/2013  . Arthritis   . Chest pain 04/05/2011   cardiac eval, normal treadmill stress test, Dr. Tollie Eth  . Chronic back pain   . Constipation   . Farsightedness    wears glasses, Eye care center  . 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 [name], [facility]  . GERD (gastroesophageal reflux disease)   . History of uterine fibroid   .  Hyperlipidemia   . Hypertension   . Paresthesia 09/2014   initially thought to be TIA, neurology consult in 12/2014 with other non TIA considerations.    . Polyarthralgia    normal rheumatoid screen 01/2012   Current Outpatient Medications on File Prior to Visit  Medication Sig Dispense Refill  . aspirin EC 81 MG tablet Take 1 tablet (81 mg total) by mouth daily. 90 tablet 3  . cholecalciferol (VITAMIN D) 1000 units tablet Take 1 tablet (1,000 Units total) by mouth daily. 90 tablet 3  . cyclobenzaprine (FLEXERIL) 10 MG tablet Take 1 tablet (10 mg total) by mouth 2 (two) times daily as needed for muscle spasms. 20 tablet 2  . dexlansoprazole (DEXILANT) 60 MG capsule Take 1 capsule (60 mg total) by mouth daily. 90 capsule 3  . doxycycline (VIBRAMYCIN) 100 MG capsule Take 1 capsule (100 mg total) by mouth 2 (two) times daily. 20 capsule 0  . EQ ALLERGY RELIEF 10 MG tablet TAKE ONE TABLET BY MOUTH ONCE DAILY 30 tablet 11  . famotidine (PEPCID) 20 MG tablet Take 1 tablet (20 mg total) by mouth at bedtime. 90 tablet 3  . fluticasone (FLONASE) 50 MCG/ACT nasal spray USE ONE SPRAY(S) IN EACH NOSTRIL DAILY AS NEEDED FOR ALLERGIES OR RHINITIS. 16 g 11  . gabapentin (NEURONTIN) 300 MG capsule TAKE ONE  TABLET BY MOUTH IN THE AFTERNOON AND 2 AT BEDTIME 270 capsule 3  . hydrocortisone (PROCTOCORT) 1 % CREA Apply 1 application topically daily. 45 g 1  . hydroxychloroquine (PLAQUENIL) 200 MG tablet TAKE 1 TABLET BY MOUTH TWICE A DAY 60 tablet 1  . losartan-hydrochlorothiazide (HYZAAR) 50-12.5 MG tablet Take 1 tablet by mouth daily. 90 tablet 3  . meloxicam (MOBIC) 7.5 MG tablet Take 1 tablet (7.5 mg total) by mouth daily. 30 tablet 0  . ondansetron (ZOFRAN) 4 MG tablet TAKE ONE TABLET BY MOUTH EVERY 8 HOURS AS NEEDED FOR  NAUSEA  OR  VOMITING 20 tablet 2  . polyethylene glycol powder (GLYCOLAX/MIRALAX) powder TAKE ONE CAPFUL (17 GRAMS) BY MOUTH DAILY. 850 g 3  . simvastatin (ZOCOR) 20 MG tablet Take 1 tablet  (20 mg total) by mouth daily. 90 tablet 3   No current facility-administered medications on file prior to visit.    ROS as in subjective   Objective: BP 120/78   Pulse 70   Temp 98 F (36.7 C) (Oral)   Resp 16   Ht 5\' 9"  (1.753 m)   Wt 215 lb (97.5 kg)   LMP 02/23/2015   SpO2 97%   BMI 31.75 kg/m   Gen: wd, wn, nad Seated with orthotic boot on left foot, and arm sling applied to left arm Afebrile, pleasant, not toxic appearing    Assessment: Encounter Diagnoses  Name Primary?  . Impaired fasting blood sugar Yes  . Leukocytosis, unspecified type   . Essential hypertension      Plan: I reviewed her emergency department notes as well as orthopedic follow-up notes from December and January.  She seems to be improving.  She continues in the equalizer boot, she is using arm sling to help rest the arm some.  She notes improvement in general.  She denies feeling sick or febrile today.  Her blood count showed quite elevated white blood cell count and neutrophil count on 04/05/2018.  We will check a hemoglobin A1c marker today as well as repeat CBC stat so she can have results back hopefully by the time she sees orthopedic today at 1:30 PM.    She will continue same blood pressure medicine but I will increase her potassium to twice daily as her potassium was running just a little bit low.  Lisa Briggs was seen today for elevated blood sugar.  Diagnoses and all orders for this visit:  Impaired fasting blood sugar -     Hemoglobin A1c  Leukocytosis, unspecified type -     CBC with Differential/Platelet  Essential hypertension  Other orders -     potassium chloride (K-DUR,KLOR-CON) 10 MEQ tablet; Take 1 tablet (10 mEq total) by mouth 2 (two) times daily.

## 2018-04-11 NOTE — Patient Instructions (Addendum)
Encounter Diagnoses  Name Primary?  . Impaired fasting blood sugar Yes  . Leukocytosis, unspecified type   . Essential hypertension     Recommendations  I increased her potassium to twice daily as a go ahead and take 1 tablet twice a day of the potassium you have left.  But the refills should indicate the increased dose.  Follow-up with orthopedics today and make sure they are aware I ordered a stat CBC today so they can have results by the time you have your appointment  Call back with results on the diabetes marker.  Your blood sugar was elevated recently but you were non fasting so I assume her numbers are going to be fine

## 2018-04-12 ENCOUNTER — Other Ambulatory Visit: Payer: Self-pay | Admitting: Medical

## 2018-04-12 DIAGNOSIS — D649 Anemia, unspecified: Secondary | ICD-10-CM

## 2018-04-15 ENCOUNTER — Ambulatory Visit (INDEPENDENT_AMBULATORY_CARE_PROVIDER_SITE_OTHER): Payer: BLUE CROSS/BLUE SHIELD | Admitting: Orthopaedic Surgery

## 2018-04-15 ENCOUNTER — Encounter (INDEPENDENT_AMBULATORY_CARE_PROVIDER_SITE_OTHER): Payer: Self-pay | Admitting: Orthopaedic Surgery

## 2018-04-15 DIAGNOSIS — M71122 Other infective bursitis, left elbow: Secondary | ICD-10-CM

## 2018-04-15 NOTE — Progress Notes (Signed)
Office Visit Note   [name]           Date of Birth: 01-May-1960           MRN: [id number] Visit Date: 04/15/2018              Requested by: Carlena Hurl, PA-C [address] [place], [place] [address] PCP: Carlena Hurl, PA-C   Assessment & Plan: Visit Diagnoses:  1. Septic olecranon bursitis, left     Plan: Continued resolution of infected left olecranon bursa.  Completing a two-week course of doxycycline without fever or chills.  Erythema has resolved.  No active drainage from I&D site.  Reapply a new dressing.  Finish course of doxycycline and return end of week  Follow-Up Instructions: Return in about 4 days (around 04/19/2018).   Orders:  No orders of the defined types were placed in this encounter.  No orders of the defined types were placed in this encounter.     Procedures: No procedures performed   Clinical Data: No additional findings.   Subjective: Chief Complaint  Patient presents with  . Left Elbow - Follow-up  . Left Foot - Follow-up  Lisa Briggs presents in the office today to follow up for left foot and left infected olecranon bursa. The swelling has gone down in the left elbow, patient denies fever or chills. Patient is still taking doxycyline 100mg  BID.  Also has what appears to be a sprain of the lateral aspect of her left ankle treated in equalizer boot  HPI  Review of Systems  Constitutional: Negative for chills and fever.  HENT: Negative for sore throat and tinnitus.   Eyes: Negative for pain and itching.  Respiratory: Negative for shortness of breath and wheezing.   Cardiovascular: Negative for chest pain.  Gastrointestinal: Negative for abdominal pain, constipation and diarrhea.  Endocrine: Negative for polyuria.  Genitourinary: Negative for pelvic pain and urgency.  Musculoskeletal: Negative for back pain and neck pain.  Skin: Negative for rash.  Allergic/Immunologic: Negative for food allergies.  Neurological:  Negative for dizziness, weakness and headaches.  Hematological: Does not bruise/bleed easily.  Psychiatric/Behavioral: Negative for confusion. The patient is not nervous/anxious.      Objective: Vital Signs: BP 132/85 (BP Location: Right Arm, Patient Position: Sitting, Cuff Size: Large)   Pulse 71   Temp 97.9 F (36.6 C) (Oral)   Resp 15   Ht 5' 9.5" (1.765 m)   Wt 215 lb (97.5 kg)   LMP 02/23/2015   BMI 31.29 kg/m   Physical Exam  Ortho Exam superficial blister formation about the left olecranon.  No drainage from the I&D site.  No swelling or erythema or increased heat forearm.  Able to fully extend the elbow still has some discomfort in flexion good grip and good release.  Specialty Comments:  No specialty comments available.  Imaging: No results found.   PMFS History: Patient Active Problem List   Diagnosis Date Noted  . Left foot pain 04/08/2018  . Septic olecranon bursitis, left 04/05/2018  . Leg swelling 12/05/2017  . Need for shingles vaccine 12/05/2017  . Primary osteoarthritis of both hands 11/23/2017  . Rheumatoid factor positive 11/23/2017  . Primary osteoarthritis of both knees 11/23/2017  . Primary osteoarthritis of both feet 11/23/2017  . DDD (degenerative disc disease), lumbar 11/23/2017  . Vaccine counseling 08/13/2017  . Polyarthralgia 08/13/2017  . Joint stiffness 08/13/2017  . Sensitive skin 01/23/2017  . Need for influenza vaccination 01/23/2017  .  Proteinuria 01/23/2017  . History of gastrointestinal stromal tumor (GIST) 04/20/2016  . History of TIA (transient ischemic attack) 04/20/2016  . Screening for breast cancer 04/20/2016  . Estrogen deficiency 04/20/2016  . Chronic maxillary sinusitis 04/20/2016  . Impaired fasting blood sugar 04/20/2016  . Constipation 04/20/2016  . History of fall 04/20/2016  . Chronic radicular lumbar pain 01/27/2016  . Encounter for health maintenance examination in adult 03/22/2015  . Paresthesia 03/22/2015    . Cognitive decline 03/22/2015  . Screening for cervical cancer 03/22/2015  . Vitamin D deficiency 03/22/2015  . History of uterine leiomyoma 03/22/2015  . Gastroesophageal reflux disease without esophagitis 03/02/2014  . Chronic nausea 03/02/2014  . Chronic abdominal pain 03/02/2014  . Rhinitis, allergic 03/02/2014  . Essential hypertension 03/02/2014  . Hyperlipidemia 03/02/2014  . Class 1 obesity with serious comorbidity in adult 01/16/2012   Past Medical History:  Diagnosis Date  . Allergy   . Anemia    iron therapy for years as of 10/12; normal Hgb 08/2013  . Arthritis   . Chest pain 04/05/2011   cardiac eval, normal treadmill stress test, Dr. Tollie Eth  . Chronic back pain   . Constipation   . Farsightedness    wears glasses, Eye care center  . 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 [name], [facility]  . GERD (gastroesophageal reflux disease)   . History of uterine fibroid   . Hyperlipidemia   . Hypertension   . Paresthesia 09/2014   initially thought to be TIA, neurology consult in 12/2014 with other non TIA considerations.    . Polyarthralgia    normal rheumatoid screen 01/2012    Family History  Problem Relation Age of Onset  . Hypertension Mother   . Arthritis Mother   . GER disease Mother   . Glaucoma Mother   . Breast cancer Mother   . Stroke Father   . Breast cancer Sister        breast cancer dx late 23s  . Hypertension Sister   . Colon cancer Brother 45  . Diabetes Brother   . Diabetes Maternal Aunt   . Heart disease Maternal Aunt   . Heart disease Maternal Grandmother   . Heart disease Maternal Grandfather   . Heart disease Maternal Uncle   . Stroke Maternal Uncle   . Leukemia Sister   . Hypertension Sister   . Healthy Son   . Colon polyps Neg Hx     Past Surgical History:  Procedure Laterality Date  . COLONOSCOPY  01/2014   diverticulosis, othwerise normal - Dr. Owens Loffler  .  ESOPHAGOGASTRODUODENOSCOPY  2013   Dr. Benson Norway, gastritis  . ESOPHAGOGASTRODUODENOSCOPY  [id number]  . EUS N/A 04/16/2014   Procedure: UPPER ENDOSCOPIC ULTRASOUND (EUS) LINEAR;  Surgeon: Milus Banister, MD;  Location: [facility];  Service: Endoscopy;  Laterality: N/A;  . gall stone surgery    . KNEE ARTHROSCOPY Left   . LAPAROSCOPIC GASTRIC RESECTION N/A 06/09/2014   Procedure: LAPAROSCOPIC GASTRIC MASS RESECTION;  Surgeon: [name], MD;  Location: [facility];  Service: General;  Laterality: N/A;  . LIPOMA EXCISION     forehead  . UTERINE FIBROID SURGERY     Social History   Occupational History  . Occupation: IT sales professional: [employer]  Tobacco Use  . Smoking status: Never Smoker  . Smokeless tobacco: Never Used  Substance and Sexual Activity  . Alcohol use: No    Alcohol/week: 0.0 standard  drinks  . Drug use: No  . Sexual activity: Not on file

## 2018-04-16 ENCOUNTER — Telehealth: Payer: Self-pay | Admitting: Rheumatology

## 2018-04-16 ENCOUNTER — Other Ambulatory Visit: Payer: BLUE CROSS/BLUE SHIELD

## 2018-04-16 DIAGNOSIS — D649 Anemia, unspecified: Secondary | ICD-10-CM

## 2018-04-16 NOTE — Telephone Encounter (Signed)
Patient called to let Dr. Durward Fortes know that [facility] was re-faxing the paperwork for Dr. Durward Fortes to complete.  Patient states [facility] received their paperwork.  Patient was told that we received the fax from [facility] today 04/16/18.

## 2018-04-16 NOTE — Telephone Encounter (Signed)
Paperwork sent to [employer]

## 2018-04-17 ENCOUNTER — Other Ambulatory Visit: Payer: Self-pay | Admitting: Medical

## 2018-04-17 ENCOUNTER — Other Ambulatory Visit: Payer: Self-pay

## 2018-04-17 DIAGNOSIS — D649 Anemia, unspecified: Secondary | ICD-10-CM

## 2018-04-17 LAB — IRON: Iron: 48 ug/dL (ref 27–159)

## 2018-04-17 LAB — B12 AND FOLATE PANEL
Folate: 18 ng/mL (ref >3.0)
Vitamin B-12: 1128 pg/mL (ref 232–1245)

## 2018-04-18 ENCOUNTER — Encounter (INDEPENDENT_AMBULATORY_CARE_PROVIDER_SITE_OTHER): Payer: Self-pay | Admitting: Orthopaedic Surgery

## 2018-04-18 ENCOUNTER — Ambulatory Visit (INDEPENDENT_AMBULATORY_CARE_PROVIDER_SITE_OTHER): Payer: BLUE CROSS/BLUE SHIELD | Admitting: Orthopaedic Surgery

## 2018-04-18 ENCOUNTER — Encounter (INDEPENDENT_AMBULATORY_CARE_PROVIDER_SITE_OTHER): Payer: Self-pay

## 2018-04-18 VITALS — BP 114/74 | HR 66 | Resp 16 | Ht 69.0 in | Wt 215.0 lb

## 2018-04-18 DIAGNOSIS — M71122 Other infective bursitis, left elbow: Secondary | ICD-10-CM

## 2018-04-18 NOTE — Progress Notes (Signed)
Office Visit Note   [name]           Date of Birth: 20-Mar-1961           MRN: [id number] Visit Date: 04/18/2018              Requested by: Carlena Hurl, PA-C [address] [place], [place] [address] PCP: Carlena Hurl, PA-C   Assessment & Plan: Visit Diagnoses:  1. Septic olecranon bursitis of left elbow     Plan: Left olecranon bursa appears to have resolved without any evidence of active infection.  No drainage or erythema.  Working on range of motion.  Also has sprain of lateral left ankle and wearing an equalizer boot.  Will reevaluate in 2 weeks and maintain no work during that period of time .clinic completes 2-week course of  Follow-Up Instructions: Return in about 1 week (around 04/25/2018).   Orders:  No orders of the defined types were placed in this encounter.  No orders of the defined types were placed in this encounter.     Procedures: No procedures performed   Clinical Data: No additional findings.   Subjective: Chief Complaint  Patient presents with  . Left Elbow - Injury  . Left Ankle - Injury  . Foot Injury    Left foot injury  . Elbow Injury    Left elbow injury    HPI   Lisa Briggs is a 58 year old female being seen today for a follow up of her left elbow and ankle. Patient fell. Improving. Tylenol helps some.  Review of Systems  Constitutional: Negative for fatigue.  HENT: Negative for trouble swallowing.   Eyes: Negative for pain.  Respiratory: Negative for shortness of breath.   Cardiovascular: Negative for leg swelling.  Gastrointestinal: Negative for constipation.  Endocrine: Negative for cold intolerance.  Genitourinary: Negative for difficulty urinating.  Musculoskeletal: Positive for joint swelling.  Skin: Negative for rash.  Allergic/Immunologic: Positive for food allergies.  Neurological: Positive for weakness.  Hematological: Does not bruise/bleed easily.  Psychiatric/Behavioral: Negative for sleep  disturbance.     Objective: Vital Signs: BP 114/74 (BP Location: Right Arm, Patient Position: Sitting, Cuff Size: Normal)   Pulse 66   Resp 16   Ht 5\' 9"  (1.753 m)   Wt 215 lb (97.5 kg)   LMP 02/23/2015   BMI 31.75 kg/m   Physical Exam  Ortho Exam no erythema or local tenderness about the left elbow.  No drainage or fluctuance.  Full extension.  Flexes about 110 degrees.  Full pronation and supination.  Good grip and good release no distal edema.  Did not evaluate ankle today which is in the equalizer boot but walking without much pain Specialty Comments:  No specialty comments available.  Imaging: No results found.   PMFS History: Patient Active Problem List   Diagnosis Date Noted  . Left foot pain 04/08/2018  . Septic olecranon bursitis of left elbow 04/05/2018  . Leg swelling 12/05/2017  . Need for shingles vaccine 12/05/2017  . Primary osteoarthritis of both hands 11/23/2017  . Rheumatoid factor positive 11/23/2017  . Primary osteoarthritis of both knees 11/23/2017  . Primary osteoarthritis of both feet 11/23/2017  . DDD (degenerative disc disease), lumbar 11/23/2017  . Vaccine counseling 08/13/2017  . Polyarthralgia 08/13/2017  . Joint stiffness 08/13/2017  . Sensitive skin 01/23/2017  . Need for influenza vaccination 01/23/2017  . Proteinuria 01/23/2017  . History of gastrointestinal stromal tumor (GIST) 04/20/2016  . History  of TIA (transient ischemic attack) 04/20/2016  . Screening for breast cancer 04/20/2016  . Estrogen deficiency 04/20/2016  . Chronic maxillary sinusitis 04/20/2016  . Impaired fasting blood sugar 04/20/2016  . Constipation 04/20/2016  . History of fall 04/20/2016  . Chronic radicular lumbar pain 01/27/2016  . Encounter for health maintenance examination in adult 03/22/2015  . Paresthesia 03/22/2015  . Cognitive decline 03/22/2015  . Screening for cervical cancer 03/22/2015  . Vitamin D deficiency 03/22/2015  . History of uterine  leiomyoma 03/22/2015  . Gastroesophageal reflux disease without esophagitis 03/02/2014  . Chronic nausea 03/02/2014  . Chronic abdominal pain 03/02/2014  . Rhinitis, allergic 03/02/2014  . Essential hypertension 03/02/2014  . Hyperlipidemia 03/02/2014  . Class 1 obesity with serious comorbidity in adult 01/16/2012   Past Medical History:  Diagnosis Date  . Allergy   . Anemia    iron therapy for years as of 10/12; normal Hgb 08/2013  . Arthritis   . Chest pain 04/05/2011   cardiac eval, normal treadmill stress test, Dr. Tollie Eth  . Chronic back pain   . Constipation   . Farsightedness    wears glasses, [facility] care center  . Gastrointestinal stromal tumor (GIST) ([place]) 06/2014   Dr. [name], [facility]  . GERD (gastroesophageal reflux disease)   . History of uterine fibroid   . Hyperlipidemia   . Hypertension   . Paresthesia 09/2014   initially thought to be TIA, neurology consult in 12/2014 with other non TIA considerations.    . Polyarthralgia    normal rheumatoid screen 01/2012    Family History  Problem Relation Age of Onset  . Hypertension Mother   . Arthritis Mother   . GER disease Mother   . Glaucoma Mother   . Breast cancer Mother   . Stroke Father   . Breast cancer Sister        breast cancer dx late 28s  . Hypertension Sister   . Colon cancer Brother 23  . Diabetes Brother   . Diabetes Maternal Aunt   . Heart disease Maternal Aunt   . Heart disease Maternal Grandmother   . Heart disease Maternal Grandfather   . Heart disease Maternal Uncle   . Stroke Maternal Uncle   . Leukemia Sister   . Hypertension Sister   . Healthy Son   . Colon polyps Neg Hx     Past Surgical History:  Procedure Laterality Date  . COLONOSCOPY  01/2014   diverticulosis, othwerise normal - Dr. Owens Loffler  . ESOPHAGOGASTRODUODENOSCOPY  2013   Dr. Benson Norway, gastritis  . ESOPHAGOGASTRODUODENOSCOPY  [id number]  . EUS N/A 04/16/2014   Procedure: UPPER ENDOSCOPIC  ULTRASOUND (EUS) LINEAR;  Surgeon: Milus Banister, MD;  Location: [facility];  Service: Endoscopy;  Laterality: N/A;  . gall stone surgery    . KNEE ARTHROSCOPY Left   . LAPAROSCOPIC GASTRIC RESECTION N/A 06/09/2014   Procedure: LAPAROSCOPIC GASTRIC MASS RESECTION;  Surgeon: [name], MD;  Location: [facility];  Service: General;  Laterality: N/A;  . LIPOMA EXCISION     forehead  . UTERINE FIBROID SURGERY     Social History   Occupational History  . Occupation: IT sales professional: [employer]  Tobacco Use  . Smoking status: Never Smoker  . Smokeless tobacco: Never Used  Substance and Sexual Activity  . Alcohol use: No    Alcohol/week: 0.0 standard drinks  . Drug use: No  . Sexual activity: Not on file

## 2018-04-22 ENCOUNTER — Telehealth (INDEPENDENT_AMBULATORY_CARE_PROVIDER_SITE_OTHER): Payer: Self-pay | Admitting: Orthopaedic Surgery

## 2018-04-22 NOTE — Telephone Encounter (Signed)
Per Dr. Durward Fortes, patient needs re-evaluation before returning to work 05/02/2018 or disability extended, appt 04/30/2018, I called patient.

## 2018-04-22 NOTE — Telephone Encounter (Signed)
Ok to write? 

## 2018-04-22 NOTE — Telephone Encounter (Signed)
Please advise 

## 2018-04-22 NOTE — Telephone Encounter (Signed)
Patient called stating her return to work note states 05/02/18 with no restrictions and she has appointment with Dr. Durward Fortes on 05/02/18 at 3:15 pm.  Patient states she is still wearing her boot and is requesting another note with a later return to work date.  Patient requested a return call.

## 2018-04-24 ENCOUNTER — Telehealth: Payer: Self-pay

## 2018-04-24 NOTE — Telephone Encounter (Signed)
Pharmacy called states that they transferred RX from [place] that was suppose to be 81mg  Asprin but instead they filled 325mg   Asprin.   They called the patient to verify that she has been taking the 325 mg Asprin and she has been.   Pharmacy wanted you to advise on what to do?

## 2018-04-24 NOTE — Telephone Encounter (Signed)
I filled Aspirin 81mg  08/2017 with year refills.  I didn't refill 325mg  Aspirin.  She should ask for refund and take the 325mg  back.  She should be taking 81mg  daily

## 2018-04-25 NOTE — Telephone Encounter (Signed)
Pharmacy notified to change back to 81 mg Asprin

## 2018-04-30 ENCOUNTER — Ambulatory Visit (INDEPENDENT_AMBULATORY_CARE_PROVIDER_SITE_OTHER): Payer: BLUE CROSS/BLUE SHIELD | Admitting: Orthopaedic Surgery

## 2018-04-30 ENCOUNTER — Encounter (INDEPENDENT_AMBULATORY_CARE_PROVIDER_SITE_OTHER): Payer: Self-pay | Admitting: Orthopaedic Surgery

## 2018-04-30 VITALS — BP 114/68 | HR 76 | Ht 69.0 in | Wt 215.0 lb

## 2018-04-30 DIAGNOSIS — M71122 Other infective bursitis, left elbow: Secondary | ICD-10-CM | POA: Diagnosis not present

## 2018-04-30 DIAGNOSIS — M25572 Pain in left ankle and joints of left foot: Secondary | ICD-10-CM | POA: Diagnosis not present

## 2018-04-30 NOTE — Progress Notes (Signed)
Office Visit Note   [name]           Date of Birth: 1960-11-14           MRN: [id number] Visit Date: 04/30/2018              Requested by: Carlena Hurl, PA-C [address] [place], [place] [address] PCP: Carlena Hurl, PA-C   Assessment & Plan: Visit Diagnoses:  1. Septic olecranon bursitis of left elbow     Plan: Several week status post irrigation and debridement of infected olecranon bursa left elbow.  Presently doing quite well.  No erythema or ecchymosis.  Neurologically intact.  Has almost full range of motion.  Unable to work with her arm until she regains full motion and less pain.  Also being followed for sprain of her left ankle which is slowly resolving.  Will switch from an equalizer boot to an ankle support and evaluate in 2 weeks  Follow-Up Instructions: Return in about 2 weeks (around 05/14/2018).   Orders:  No orders of the defined types were placed in this encounter.  No orders of the defined types were placed in this encounter.     Procedures: No procedures performed   Clinical Data: No additional findings.   Subjective: Chief Complaint  Patient presents with  . Right Arm - Follow-up  Several weeks status post I&D of infected left olecranon bursa.  Doing quite well at this point without evidence of persistent infection.  No related fever chills or upper extremity swelling.  Also being followed for sprain of her left ankle wearing an equalizer boot and feeling better HPI  Review of Systems   Objective: Vital Signs: BP 114/68 (BP Location: Left Arm)   Pulse 76   Ht 5\' 9"  (1.753 m)   Wt 215 lb (97.5 kg)   LMP 02/23/2015   BMI 31.75 kg/m   Physical Exam  Ortho Exam left olecranon bursa without erythema or ecchymosis or swelling.  Full extension.  Lacks a few degrees to full flexion but has full pronation and supination.  Good grip and good release.  Left ankle with some mild swelling over the lateral ankle and  specifically over the anterior talofibular fibulocalcaneal ligament.  No instability.  Skin intact.  Neurovascular exam intact.  No pain medially Specialty Comments:  No specialty comments available.  Imaging: No results found.   PMFS History: Patient Active Problem List   Diagnosis Date Noted  . Left foot pain 04/08/2018  . Septic olecranon bursitis of left elbow 04/05/2018  . Leg swelling 12/05/2017  . Need for shingles vaccine 12/05/2017  . Primary osteoarthritis of both hands 11/23/2017  . Rheumatoid factor positive 11/23/2017  . Primary osteoarthritis of both knees 11/23/2017  . Primary osteoarthritis of both feet 11/23/2017  . DDD (degenerative disc disease), lumbar 11/23/2017  . Vaccine counseling 08/13/2017  . Polyarthralgia 08/13/2017  . Joint stiffness 08/13/2017  . Sensitive skin 01/23/2017  . Need for influenza vaccination 01/23/2017  . Proteinuria 01/23/2017  . History of gastrointestinal stromal tumor (GIST) 04/20/2016  . History of TIA (transient ischemic attack) 04/20/2016  . Screening for breast cancer 04/20/2016  . Estrogen deficiency 04/20/2016  . Chronic maxillary sinusitis 04/20/2016  . Impaired fasting blood sugar 04/20/2016  . Constipation 04/20/2016  . History of fall 04/20/2016  . Chronic radicular lumbar pain 01/27/2016  . Encounter for health maintenance examination in adult 03/22/2015  . Paresthesia 03/22/2015  . Cognitive decline 03/22/2015  . Screening  for cervical cancer 03/22/2015  . Vitamin D deficiency 03/22/2015  . History of uterine leiomyoma 03/22/2015  . Gastroesophageal reflux disease without esophagitis 03/02/2014  . Chronic nausea 03/02/2014  . Chronic abdominal pain 03/02/2014  . Rhinitis, allergic 03/02/2014  . Essential hypertension 03/02/2014  . Hyperlipidemia 03/02/2014  . Class 1 obesity with serious comorbidity in adult 01/16/2012   Past Medical History:  Diagnosis Date  . Allergy   . Anemia    iron therapy for years  as of 10/12; normal Hgb 08/2013  . Arthritis   . Chest pain 04/05/2011   cardiac eval, normal treadmill stress test, Dr. Tollie Eth  . Chronic back pain   . Constipation   . Farsightedness    wears glasses, Eye care center  . Gastrointestinal stromal tumor (GIST) ([place]) 06/2014   Dr. [name], [facility]  . GERD (gastroesophageal reflux disease)   . History of uterine fibroid   . Hyperlipidemia   . Hypertension   . Paresthesia 09/2014   initially thought to be TIA, neurology consult in 12/2014 with other non TIA considerations.    . Polyarthralgia    normal rheumatoid screen 01/2012    Family History  Problem Relation Age of Onset  . Hypertension Mother   . Arthritis Mother   . GER disease Mother   . Glaucoma Mother   . Breast cancer Mother   . Stroke Father   . Breast cancer Sister        breast cancer dx late 50s  . Hypertension Sister   . Colon cancer Brother 20  . Diabetes Brother   . Diabetes Maternal Aunt   . Heart disease Maternal Aunt   . Heart disease Maternal Grandmother   . Heart disease Maternal Grandfather   . Heart disease Maternal Uncle   . Stroke Maternal Uncle   . Leukemia Sister   . Hypertension Sister   . Healthy Son   . Colon polyps Neg Hx     Past Surgical History:  Procedure Laterality Date  . COLONOSCOPY  01/2014   diverticulosis, othwerise normal - Dr. Owens Loffler  . ESOPHAGOGASTRODUODENOSCOPY  2013   Dr. Benson Norway, gastritis  . ESOPHAGOGASTRODUODENOSCOPY  [id number]  . EUS N/A 04/16/2014   Procedure: UPPER ENDOSCOPIC ULTRASOUND (EUS) LINEAR;  Surgeon: Milus Banister, MD;  [facility];  Service: Endoscopy;  Laterality: N/A;  . gall stone surgery    . KNEE ARTHROSCOPY Left   . LAPAROSCOPIC GASTRIC RESECTION N/A 06/09/2014   Procedure: LAPAROSCOPIC GASTRIC MASS RESECTION;  Surgeon: [name], MD;  [facility]: [facility];  Service: General;  Laterality: N/A;  . LIPOMA EXCISION     forehead  . UTERINE FIBROID SURGERY       Social History   Occupational History  . Occupation: IT sales professional: [employer]  Tobacco Use  . Smoking status: Never Smoker  . Smokeless tobacco: Never Used  Substance and Sexual Activity  . Alcohol use: No    Alcohol/week: 0.0 standard drinks  . Drug use: No  . Sexual activity: Not on file     Garald Balding, MD   Note - This record has been created using [employer].  Chart creation errors have been sought, but may not always  have been located. Such creation errors do not reflect on  the standard of medical care.

## 2018-05-02 ENCOUNTER — Ambulatory Visit (INDEPENDENT_AMBULATORY_CARE_PROVIDER_SITE_OTHER): Payer: BLUE CROSS/BLUE SHIELD | Admitting: Orthopaedic Surgery

## 2018-05-03 ENCOUNTER — Other Ambulatory Visit: Payer: Self-pay | Admitting: Rheumatology

## 2018-05-06 ENCOUNTER — Other Ambulatory Visit: Payer: Self-pay | Admitting: Medical

## 2018-05-06 ENCOUNTER — Telehealth (INDEPENDENT_AMBULATORY_CARE_PROVIDER_SITE_OTHER): Payer: Self-pay | Admitting: Orthopaedic Surgery

## 2018-05-06 DIAGNOSIS — Z1231 Encounter for screening mammogram for malignant neoplasm of breast: Secondary | ICD-10-CM

## 2018-05-06 NOTE — Telephone Encounter (Signed)
Patient stopped by office to request last out of work note, dated out of work until 05/15/2018, be faxed to [place].  Patients [place] ID# [id number]  Fax# [phone]  Patients Cherylin Mylar ID# [id number] Patient did not have fax# Should be on file, per patient.

## 2018-05-07 NOTE — Telephone Encounter (Signed)
Faxed

## 2018-05-08 ENCOUNTER — Other Ambulatory Visit: Payer: BLUE CROSS/BLUE SHIELD

## 2018-05-08 DIAGNOSIS — D649 Anemia, unspecified: Secondary | ICD-10-CM | POA: Diagnosis not present

## 2018-05-08 LAB — CBC
Hematocrit: 36.1 % (ref 34.0–46.6)
Hemoglobin: 11.8 g/dL (ref 11.1–15.9)
MCH: 27.6 pg (ref 26.6–33.0)
MCHC: 32.7 g/dL (ref 31.5–35.7)
MCV: 85 fL (ref 79–97)
Platelets: 277 10*3/uL (ref 150–450)
RBC: 4.27 x10E6/uL (ref 3.77–5.28)
RDW: 12.7 % (ref 11.7–15.4)
WBC: 6.5 10*3/uL (ref 3.4–10.8)

## 2018-05-10 ENCOUNTER — Telehealth: Payer: Self-pay | Admitting: Family Medicine

## 2018-05-10 ENCOUNTER — Other Ambulatory Visit: Payer: Self-pay | Admitting: Medical

## 2018-05-10 MED ORDER — MELOXICAM 7.5 MG PO TABS: 7.5000 mg | ORAL_TABLET | Freq: Every day | ORAL | 0 refills | Status: DC

## 2018-05-10 NOTE — Telephone Encounter (Signed)
Pt wants refill of meloxicam to [facility]

## 2018-05-14 ENCOUNTER — Encounter (INDEPENDENT_AMBULATORY_CARE_PROVIDER_SITE_OTHER): Payer: Self-pay | Admitting: Orthopaedic Surgery

## 2018-05-14 ENCOUNTER — Ambulatory Visit (INDEPENDENT_AMBULATORY_CARE_PROVIDER_SITE_OTHER): Payer: BLUE CROSS/BLUE SHIELD | Admitting: Orthopaedic Surgery

## 2018-05-14 VITALS — BP 123/88 | HR 84 | Ht 69.0 in | Wt 215.0 lb

## 2018-05-14 DIAGNOSIS — M7022 Olecranon bursitis, left elbow: Secondary | ICD-10-CM | POA: Diagnosis not present

## 2018-05-14 DIAGNOSIS — M79672 Pain in left foot: Secondary | ICD-10-CM

## 2018-05-14 NOTE — Progress Notes (Signed)
Office Visit Note   [name]           Date of Birth: 01-21-1961           MRN: [id number] Visit Date: 05/14/2018              Requested by: Carlena Hurl, PA-C [address] [place],  [address] PCP: Carlena Hurl, PA-C   Assessment & Plan: Visit Diagnoses:  1. Left foot pain   2. Olecranon bursitis of left elbow     Plan: Olecranon bursitis left elbow has resolved.  Patient would like to try some physical therapy before returning to work as she feels like her arm is "weak".  Also has a sprain of the lateral aspect of her left ankle and possibly some injury to the peroneal tendons.  Presently doing fairly well just wearing a pullover ankle support.  That injury does not prevent her from working.  Will reevaluate in 2 weeks and in the meantime have her continue out of work Follow-Up Instructions: Return in about 2 weeks (around 05/28/2018).   Orders:  No orders of the defined types were placed in this encounter.  No orders of the defined types were placed in this encounter.     Procedures: No procedures performed   Clinical Data: No additional findings.   Subjective: Chief Complaint  Patient presents with  . Left Ankle - Follow-up  . Left Elbow - Follow-up  Patient is presenting for a 2week follow up on her left ankle sprain and left elbow bursitis. She has been wearing an ASO brace. She still has tenderness on the anterior side of her ankle. She has been doing exercises with her left arm to work out the stiffness. She said that she need to get her strength back before going back to work. She has been taking gabapentin and tylenol.  No related fever or chills  HPI  Review of Systems  Constitutional: Negative for fatigue.  HENT: Positive for ear pain.   Eyes: Negative for pain.  Respiratory: Positive for shortness of breath.   Cardiovascular: Positive for leg swelling.  Gastrointestinal: Positive for constipation. Negative for diarrhea.    Endocrine: Positive for heat intolerance. Negative for cold intolerance.  Genitourinary: Negative for difficulty urinating.  Musculoskeletal: Negative for joint swelling.  Skin: Negative for rash.  Allergic/Immunologic: Positive for food allergies.  Neurological: Negative for weakness.  Hematological: Does not bruise/bleed easily.  Psychiatric/Behavioral: Negative for sleep disturbance.     Objective: Vital Signs: BP 123/88   Pulse 84   Ht 5\' 9"  (1.753 m)   Wt 215 lb (97.5 kg)   LMP 02/23/2015   BMI 31.75 kg/m   Physical Exam Constitutional:      Appearance: She is well-developed.  Eyes:     Pupils: Pupils are equal, round, and reactive to light.  Pulmonary:     Effort: Pulmonary effort is normal.  Skin:    General: Skin is warm and dry.  Neurological:     Mental Status: She is alert and oriented to person, place, and time.  Psychiatric:        Behavior: Behavior normal.     Ortho Exam awake alert and oriented x3.  Comfortable sitting.  Full range of motion of left elbow.  No erythema ecchymosis or edema.  Area of irrigation debridement of the olecranon bursitis infection has resolved.  Neurologically intact. Left ankle with some local swelling over the anterior talofibular ligament.  Some mild tenderness over  the peroneal tendons without evidence of instability or loss of function.  No ankle instability.  The remainder the foot looks fine without swelling or erythema.  No edema.  Neurologically intact.  No pain along the medial aspect of the ankle.  No Achilles pain Specialty Comments:  No specialty comments available.  Imaging: No results found.   PMFS History: Patient Active Problem List   Diagnosis Date Noted  . Left foot pain 04/08/2018  . Olecranon bursitis of left elbow 04/05/2018  . Leg swelling 12/05/2017  . Need for shingles vaccine 12/05/2017  . Primary osteoarthritis of both hands 11/23/2017  . Rheumatoid factor positive 11/23/2017  . Primary  osteoarthritis of both knees 11/23/2017  . Primary osteoarthritis of both feet 11/23/2017  . DDD (degenerative disc disease), lumbar 11/23/2017  . Vaccine counseling 08/13/2017  . Polyarthralgia 08/13/2017  . Joint stiffness 08/13/2017  . Sensitive skin 01/23/2017  . Need for influenza vaccination 01/23/2017  . Proteinuria 01/23/2017  . History of gastrointestinal stromal tumor (GIST) 04/20/2016  . History of TIA (transient ischemic attack) 04/20/2016  . Screening for breast cancer 04/20/2016  . Estrogen deficiency 04/20/2016  . Chronic maxillary sinusitis 04/20/2016  . Impaired fasting blood sugar 04/20/2016  . Constipation 04/20/2016  . History of fall 04/20/2016  . Chronic radicular lumbar pain 01/27/2016  . Encounter for health maintenance examination in adult 03/22/2015  . Paresthesia 03/22/2015  . Cognitive decline 03/22/2015  . Screening for cervical cancer 03/22/2015  . Vitamin D deficiency 03/22/2015  . History of uterine leiomyoma 03/22/2015  . Gastroesophageal reflux disease without esophagitis 03/02/2014  . Chronic nausea 03/02/2014  . Chronic abdominal pain 03/02/2014  . Rhinitis, allergic 03/02/2014  . Essential hypertension 03/02/2014  . Hyperlipidemia 03/02/2014  . Class 1 obesity with serious comorbidity in adult 01/16/2012   Past Medical History:  Diagnosis Date  . Allergy   . Anemia    iron therapy for years as of 10/12; normal Hgb 08/2013  . Arthritis   . Chest pain 04/05/2011   cardiac eval, normal treadmill stress test, Dr. Tollie Eth  . Chronic back pain   . Constipation   . Farsightedness    wears glasses, Eye care center  . Gastrointestinal stromal tumor (GIST) ([place]) 06/2014   Dr. [name], [facility]  . GERD (gastroesophageal reflux disease)   . History of uterine fibroid   . Hyperlipidemia   . Hypertension   . Paresthesia 09/2014   initially thought to be TIA, neurology consult in 12/2014 with other non TIA  considerations.    . Polyarthralgia    normal rheumatoid screen 01/2012    Family History  Problem Relation Age of Onset  . Hypertension Mother   . Arthritis Mother   . GER disease Mother   . Glaucoma Mother   . Breast cancer Mother   . Stroke Father   . Breast cancer Sister        breast cancer dx late 73s  . Hypertension Sister   . Colon cancer Brother 15  . Diabetes Brother   . Diabetes Maternal Aunt   . Heart disease Maternal Aunt   . Heart disease Maternal Grandmother   . Heart disease Maternal Grandfather   . Heart disease Maternal Uncle   . Stroke Maternal Uncle   . Leukemia Sister   . Hypertension Sister   . Healthy Son   . Colon polyps Neg Hx     Past Surgical History:  Procedure Laterality Date  . COLONOSCOPY  01/2014   diverticulosis, othwerise normal - Dr. Owens Loffler  . ESOPHAGOGASTRODUODENOSCOPY  2013   Dr. Benson Norway, gastritis  . ESOPHAGOGASTRODUODENOSCOPY  [id number]  . EUS N/A 04/16/2014   Procedure: UPPER ENDOSCOPIC ULTRASOUND (EUS) LINEAR;  Surgeon: Milus Banister, MD;  [facility];  Service: Endoscopy;  Laterality: N/A;  . gall stone surgery    . KNEE ARTHROSCOPY Left   . LAPAROSCOPIC GASTRIC RESECTION N/A 06/09/2014   Procedure: LAPAROSCOPIC GASTRIC MASS RESECTION;  Surgeon: [name], MD;  Location: [facility];  Service: General;  Laterality: N/A;  . LIPOMA EXCISION     forehead  . UTERINE FIBROID SURGERY     Social History   Occupational History  . Occupation: IT sales professional: [employer]  Tobacco Use  . Smoking status: Never Smoker  . Smokeless tobacco: Never Used  Substance and Sexual Activity  . Alcohol use: No    Alcohol/week: 0.0 standard drinks  . Drug use: No  . Sexual activity: Not on file

## 2018-05-16 NOTE — Addendum Note (Signed)
Addended by: Lendon Collar on: 05/16/2018 09:30 AM   Modules accepted: Orders

## 2018-05-17 ENCOUNTER — Telehealth (INDEPENDENT_AMBULATORY_CARE_PROVIDER_SITE_OTHER): Payer: Self-pay | Admitting: Orthopaedic Surgery

## 2018-05-17 NOTE — Telephone Encounter (Signed)
Patient called checking on the status of her referral to physical therapy.

## 2018-05-17 NOTE — Telephone Encounter (Signed)
Patient called requesting her work note be faxed to the following:  [employer] - [place]  - Fax [phone] [employer] Attn: Derald Macleod - Fax [phone] [employer] - Fax [phone]

## 2018-05-17 NOTE — Telephone Encounter (Signed)
Called patient. No answer. LMOM that work note has been faxed to all 4 places on 05/14/2018.

## 2018-05-17 NOTE — Telephone Encounter (Signed)
Called and spoke with patient. I explained that it usually takes a few days for them to call. I gave her the PT number at [facility]. [phone].

## 2018-05-22 ENCOUNTER — Ambulatory Visit: Payer: BLUE CROSS/BLUE SHIELD | Attending: Orthopaedic Surgery | Admitting: Physical Therapy

## 2018-05-22 ENCOUNTER — Telehealth (INDEPENDENT_AMBULATORY_CARE_PROVIDER_SITE_OTHER): Payer: Self-pay | Admitting: Orthopaedic Surgery

## 2018-05-22 ENCOUNTER — Other Ambulatory Visit: Payer: Self-pay

## 2018-05-22 ENCOUNTER — Encounter: Payer: Self-pay | Admitting: Physical Therapy

## 2018-05-22 DIAGNOSIS — M25522 Pain in left elbow: Secondary | ICD-10-CM | POA: Insufficient documentation

## 2018-05-22 DIAGNOSIS — R600 Localized edema: Secondary | ICD-10-CM | POA: Insufficient documentation

## 2018-05-22 DIAGNOSIS — M6281 Muscle weakness (generalized): Secondary | ICD-10-CM | POA: Insufficient documentation

## 2018-05-22 NOTE — Telephone Encounter (Signed)
Per patient Physical Therapy you sent her to is not in network with her insurance. Patient request a referral to [facility] [address]. Please call to advise.

## 2018-05-22 NOTE — Telephone Encounter (Signed)
Can you fix this in the referral queue? Thanks!

## 2018-05-22 NOTE — Therapy (Signed)
[place], [place], [address] Phone: [phone]   Fax:  [phone]  Physical Therapy Evaluation  Patient Details  Name: Lisa Briggs MRN: [id number] Date of Birth: 01-Sep-1960 Referring Provider (PT): Joni Fears, MD   Encounter Date: 05/22/2018  PT End of Session - 05/22/18 2117    Visit Number  1    Number of Visits  12    Date for PT Re-Evaluation  07/03/18    Authorization Type  [employer]    PT Start Time  1505    PT Stop Time  1555    PT Time Calculation (min)  50 min    Activity Tolerance  --   fair tolerance exercises with left elbow soreness   Behavior During Therapy  [facility] for tasks assessed/performed       Past Medical History:  Diagnosis Date  . Allergy   . Anemia    iron therapy for years as of 10/12; normal Hgb 08/2013  . Arthritis   . Chest pain 04/05/2011   cardiac eval, normal treadmill stress test, Dr. Tollie Eth  . Chronic back pain   . Constipation   . Farsightedness    wears glasses, [facility]  . 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 [name], [facility]  . GERD (gastroesophageal reflux disease)   . History of uterine fibroid   . Hyperlipidemia   . Hypertension   . Paresthesia 09/2014   initially thought to be TIA, neurology consult in 12/2014 with other non TIA considerations.    . Polyarthralgia    normal rheumatoid screen 01/2012    Past Surgical History:  Procedure Laterality Date  . COLONOSCOPY  01/2014   diverticulosis, othwerise normal - Dr. Owens Loffler  . ESOPHAGOGASTRODUODENOSCOPY  2013   Dr. Benson Norway, gastritis  . ESOPHAGOGASTRODUODENOSCOPY  [id number]  . EUS N/A 04/16/2014   Procedure: UPPER ENDOSCOPIC ULTRASOUND (EUS) LINEAR;  Surgeon: Milus Banister, MD;  Location: [facility];  Service: Endoscopy;  Laterality: N/A;  . gall stone surgery    . KNEE ARTHROSCOPY Left   . LAPAROSCOPIC GASTRIC RESECTION N/A 06/09/2014    Procedure: LAPAROSCOPIC GASTRIC MASS RESECTION;  Surgeon: [name], MD;  Location: [facility];  Service: General;  Laterality: N/A;  . LIPOMA EXCISION     forehead  . UTERINE FIBROID SURGERY      There were no vitals filed for this visit.   Subjective Assessment - 05/22/18 2057    Subjective  Pt. sustained left elbow and ankle injuries secondary to fall 04/01/18. Left ankle sprain/possible fx. managed with ASO brace. For left elbow pt. developed septic bursitis after injury addressed with incise and drain procedure 04/04/18 by Dr. Durward Fortes. Pt. 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    Pertinent History  also sustained left ankle injury from fall, I&D left elbow septic olecranon bursitis 04/04/18.    Limitations  Lifting;House hold activities    Diagnostic tests  X-rays    Patient Stated Goals  Improve strength in elbow for return to work    Currently in Pain?  No/denies   no pain at rest pre-tx., soreness noted with activity        [facility] PT Assessment - 05/22/18 0001      Assessment   Medical Diagnosis  Left olecranon bursitis  Referring Provider (PT)  Joni Fears, MD    Onset Date/Surgical Date  --   fall 03/22/18, s/p incise and drain 04/04/18   Hand Dominance  Right    Next MD Visit  05/28/18    Prior Therapy  none      Precautions   Precautions  None      Restrictions   Weight Bearing Restrictions  No      Balance Screen   Has the patient fallen in the past 6 months  Yes    How many times?  1   with injury 03/22/18     Prior Function   Level of Independence  Independent with basic ADLs    Vocation  --   machine operator   Vocation Requirements  --   pt. reports needs to lift at least 20-30 lbs.      Cognition   Overall Cognitive Status  Within Functional Limits for tasks assessed      Observation/Other  Assessments   Observations  --    Focus on Therapeutic Outcomes (FOTO)   64% limited      Observation/Other Assessments-Edema    Edema  --   left elbow 29 cm, right elbow 26 cm     ROM / Strength   AROM / PROM / Strength  AROM;PROM;Strength      AROM   Overall AROM Comments  left wrist flex 40 deg, ext 60 deg, right wrist ext 78 deg, flex 60 deg    AROM Assessment Site  Elbow;Forearm;Wrist    Right/Left Elbow  Right;Left    Right Elbow Flexion  145    Right Elbow Extension  0    Left Elbow Flexion  140    Left Elbow Extension  -3    Right/Left Forearm  Right;Left    Right Forearm Pronation  --   WNL   Right Forearm Supination  70 Degrees    Left Forearm Pronation  --   WNL   Left Forearm Supination  63 Degrees      Strength   Overall Strength Comments  right grip 27.33 lbs., left grip 13.33 lbs. both average of 3 attempts with grip dynamonometer    Strength Assessment Site  Shoulder;Elbow    Right/Left Shoulder  Right;Left    Right Shoulder Flexion  5/5    Right Shoulder ABduction  5/5    Right Shoulder Internal Rotation  5/5    Right Shoulder External Rotation  5/5    Left Shoulder Flexion  4/5    Left Shoulder ABduction  4-/5    Left Shoulder Internal Rotation  4+/5    Left Shoulder External Rotation  4+/5    Right/Left Elbow  Right;Left    Right Elbow Flexion  5/5    Right Elbow Extension  5/5    Left Elbow Flexion  4/5    Left Elbow Extension  4-/5      Palpation   Palpation comment  no signficant elbow tenderness to palpation noted                Objective measurements completed on examination: See above findings.      [facility] PT Treatment/Exercise - 05/22/18 0001      Exercises   Exercises  Shoulder;Elbow      Elbow Exercises   Elbow Flexion  Strengthening;Left;10 reps    Theraband Level (Elbow Flexion)  Level 2 (Red)    Elbow Extension  Strengthening;Left;10 reps    Theraband Level (Elbow  Extension)  Level 2 (Red)      Shoulder  Exercises: Standing   External Rotation  Both;10 reps    Theraband Level (Shoulder External Rotation)  Level 2 (Red)    Internal Rotation  Strengthening;Left;10 reps    Flexion  --   Theraband "punch" left x 10 reps with red Theraband   Row  Strengthening;Both;10 reps    Theraband Level (Shoulder Row)  Level 2 (Red)      Modalities   Modalities  Cryotherapy      Cryotherapy   Number Minutes Cryotherapy  10 Minutes    Cryotherapy Location  --   left elbow   Type of Cryotherapy  Ice pack             PT Education - 05/22/18 2117    Education Details  HEP, POC    Person(s) Educated  Patient    Methods  Explanation;Demonstration;Tactile cues;Verbal cues;Handout    Comprehension  Verbalized understanding;Returned demonstration;Verbal cues required;Tactile cues required;Need further instruction          PT Long Term Goals - 05/22/18 2124      PT LONG TERM GOAL #1   Title  Independent with HEP    Baseline  needs new HEP    Time  6    Period  Weeks    Status  New    Target Date  07/03/18      PT LONG TERM GOAL #2   Title  Improve FOTO outcome measure score to 43% or less impairment    Baseline  64% limited    Time  6    Period  Weeks    Status  New    Target Date  07/03/18      PT LONG TERM GOAL #3   Title  Left elbow and shoulder strength grossly 5/5 to improve ability for 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    Baseline  see flowsheet, grossly 4-/5 to 4/5    Time  6    Period  Weeks    Status  New    Target Date  07/03/18      PT LONG TERM GOAL #4   Title  Increase left grip strength to 20 lbs. or greater to improve ability for gripping and lifting activities for work duties    Baseline  13.33 lbs.    Time  6    Period  Weeks    Status  New    Target Date  07/03/18      PT LONG TERM GOAL #5   Title  Perform ADLs/IADLs with left elbow pain <2/10 at worst    Time  6    Period  Weeks    Status  New    Target Date   07/03/18             Plan - 05/22/18 2118    Clinical Impression Statement  Pt. presents with left elbow weakness and edema, pain and mild stiffness s/p impact injury with subsequent development of septic olecranon bursitis s/p incise and drain procedure. Pt. would benefit from PT to address current functional limitations and assist return to work.    History and Personal Factors relevant to plan of care:  left ankle injury, time since injury    Clinical Presentation  Stable    Clinical Decision Making  Low    Rehab Potential  Good    PT Frequency  2x / week  PT Duration  6 weeks    PT Treatment/Interventions  Electrical Stimulation;ADLs/Self Care Home Management;Cryotherapy;Ultrasound;Moist Heat;Iontophoresis 4mg /ml Dexamethasone;Therapeutic activities;Patient/family education;Therapeutic exercise;Neuromuscular re-education;Manual techniques;Dry needling;Taping;Vasopneumatic Device    PT Next Visit Plan  Review HEP as needed, tx. focus strengthening left elbow and shoulder, ROM, functional activities but include modalities prn for pain and edema managament-consideration [place] vs. ionto    PT Home Exercise Plan  Theraband elbow ext and flex, Theraband row and "punch" Theraband ER, IR    Consulted and Agree with Plan of Care  Patient       Patient will benefit from skilled therapeutic intervention in order to improve the following deficits and impairments:  Pain, Increased edema, Impaired UE functional use, Decreased range of motion, Decreased strength  Visit Diagnosis: Pain in left elbow  Muscle weakness (generalized)  Localized edema     Problem List Patient Active Problem List   Diagnosis Date Noted  . Left foot pain 04/08/2018  . Olecranon bursitis of left elbow 04/05/2018  . Leg swelling 12/05/2017  . Need for shingles vaccine 12/05/2017  . Primary osteoarthritis of both hands 11/23/2017  . Rheumatoid factor positive 11/23/2017  . Primary osteoarthritis of both knees  11/23/2017  . Primary osteoarthritis of both feet 11/23/2017  . DDD (degenerative disc disease), lumbar 11/23/2017  . Vaccine counseling 08/13/2017  . Polyarthralgia 08/13/2017  . Joint stiffness 08/13/2017  . Sensitive skin 01/23/2017  . Need for influenza vaccination 01/23/2017  . Proteinuria 01/23/2017  . History of gastrointestinal stromal tumor (GIST) 04/20/2016  . History of TIA (transient ischemic attack) 04/20/2016  . Screening for breast cancer 04/20/2016  . Estrogen deficiency 04/20/2016  . Chronic maxillary sinusitis 04/20/2016  . Impaired fasting blood sugar 04/20/2016  . Constipation 04/20/2016  . History of fall 04/20/2016  . Chronic radicular lumbar pain 01/27/2016  . Encounter for health maintenance examination in adult 03/22/2015  . Paresthesia 03/22/2015  . Cognitive decline 03/22/2015  . Screening for cervical cancer 03/22/2015  . Vitamin D deficiency 03/22/2015  . History of uterine leiomyoma 03/22/2015  . Gastroesophageal reflux disease without esophagitis 03/02/2014  . Chronic nausea 03/02/2014  . Chronic abdominal pain 03/02/2014  . Rhinitis, allergic 03/02/2014  . Essential hypertension 03/02/2014  . Hyperlipidemia 03/02/2014  . Class 1 obesity with serious comorbidity in adult 01/16/2012    Beaulah Dinning, PT, DPT 05/22/18 9:30 PM  [facility] [address] [place], [place], [address] Phone: [phone]   Fax:  [phone]  Name: Lisa Briggs MRN: [id number] Date of Birth: 1961-02-10

## 2018-05-23 NOTE — Telephone Encounter (Signed)
Faxed, pending appt

## 2018-05-28 ENCOUNTER — Encounter (INDEPENDENT_AMBULATORY_CARE_PROVIDER_SITE_OTHER): Payer: Self-pay | Admitting: Orthopaedic Surgery

## 2018-05-28 ENCOUNTER — Ambulatory Visit (INDEPENDENT_AMBULATORY_CARE_PROVIDER_SITE_OTHER): Payer: BLUE CROSS/BLUE SHIELD | Admitting: Orthopaedic Surgery

## 2018-05-28 VITALS — BP 134/90 | HR 77 | Resp 12 | Ht 69.0 in | Wt 215.0 lb

## 2018-05-28 DIAGNOSIS — M7022 Olecranon bursitis, left elbow: Secondary | ICD-10-CM

## 2018-05-28 NOTE — Progress Notes (Signed)
Office Visit Note   [name]           Date of Birth: 01-Aug-1960           MRN: [id number] Visit Date: 05/28/2018              Requested by: Carlena Hurl, PA-C [address] [place], [place] [address] PCP: Carlena Hurl, PA-C   Assessment & Plan: Visit Diagnoses:  1. Olecranon bursitis of left elbow     Plan: 2 months status post injury in which he sustained a sprain of the lateral aspect of her left ankle and struck her left elbow sustaining an infected olecranon bursa.  I I&D the infection and treated with doxycycline and is done very well.  Presently she is just using an ankle support and walking with a regular shoe for the ankle.  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  She is only had one session.  We will continue with physical therapy and reevaluate in 3 weeks.  Continue out of work until that point  Follow-Up Instructions: Return in about 3 weeks (around 06/18/2018).   Orders:  No orders of the defined types were placed in this encounter.  No orders of the defined types were placed in this encounter.     Procedures: No procedures performed   Clinical Data: No additional findings.   Subjective: No chief complaint on file. Lisa Briggs is approximately 2 months status post left ankle and left elbow injury.  She was trying to rotate while on a stepstool and lost her footing.  She fell twisting her lower ankle and landing directly on her left elbow.  She was treated for an ankle sprain and referred to the office for further evaluation.  When I initially saw her on January 2 she had an infected left olecranon bursa.  This was I&D.  She grew a staph species.  I treated her with doxycycline with resolution.  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  We set up physical therapy when she  was here last visit but only has been able to "go once".  She is doing well in terms of her left ankle wearing the ankle support  HPI  Review of Systems   Objective: Vital Signs: BP 134/90 (BP Location: Right Arm, Patient Position: Sitting, Cuff Size: Large)   Pulse 77   Resp 12   Ht 5\' 9"  (1.753 m)   Wt 215 lb (97.5 kg)   LMP 02/23/2015   BMI 31.75 kg/m   Physical Exam Constitutional:      Appearance: She is well-developed.  Eyes:     Pupils: Pupils are equal, round, and reactive to light.  Pulmonary:     Effort: Pulmonary effort is normal.  Skin:    General: Skin is warm and dry.  Neurological:     Mental Status: She is alert and oriented to person, place, and time.  Psychiatric:        Behavior: Behavior normal.     Ortho Exam full range of motion of the left elbow.  No increased heat or erythema.  Good grip and good release.  Patient feels subjectively weak.  Had good biceps and triceps strength.  Wearing an ASO ankle support and regular shoes and doing quite well with  ambulation  Specialty Comments:  No specialty comments available.  Imaging: No results found.   PMFS History: Patient Active Problem List   Diagnosis Date Noted  . Left foot pain 04/08/2018  . Olecranon bursitis of left elbow 04/05/2018  . Leg swelling 12/05/2017  . Need for shingles vaccine 12/05/2017  . Primary osteoarthritis of both hands 11/23/2017  . Rheumatoid factor positive 11/23/2017  . Primary osteoarthritis of both knees 11/23/2017  . Primary osteoarthritis of both feet 11/23/2017  . DDD (degenerative disc disease), lumbar 11/23/2017  . Vaccine counseling 08/13/2017  . Polyarthralgia 08/13/2017  . Joint stiffness 08/13/2017  . Sensitive skin 01/23/2017  . Need for influenza vaccination 01/23/2017  . Proteinuria 01/23/2017  . History of gastrointestinal stromal tumor (GIST) 04/20/2016  . History of TIA (transient ischemic attack) 04/20/2016  . Screening for breast cancer  04/20/2016  . Estrogen deficiency 04/20/2016  . Chronic maxillary sinusitis 04/20/2016  . Impaired fasting blood sugar 04/20/2016  . Constipation 04/20/2016  . History of fall 04/20/2016  . Chronic radicular lumbar pain 01/27/2016  . Encounter for health maintenance examination in adult 03/22/2015  . Paresthesia 03/22/2015  . Cognitive decline 03/22/2015  . Screening for cervical cancer 03/22/2015  . Vitamin D deficiency 03/22/2015  . History of uterine leiomyoma 03/22/2015  . Gastroesophageal reflux disease without esophagitis 03/02/2014  . Chronic nausea 03/02/2014  . Chronic abdominal pain 03/02/2014  . Rhinitis, allergic 03/02/2014  . Essential hypertension 03/02/2014  . Hyperlipidemia 03/02/2014  . Class 1 obesity with serious comorbidity in adult 01/16/2012   Past Medical History:  Diagnosis Date  . Allergy   . Anemia    iron therapy for years as of 10/12; normal Hgb 08/2013  . Arthritis   . Chest pain 04/05/2011   cardiac eval, normal treadmill stress test, Dr. Tollie Eth  . Chronic back pain   . Constipation   . Farsightedness    wears glasses, Eye care center  . 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 [name], [facility]  . GERD (gastroesophageal reflux disease)   . History of uterine fibroid   . Hyperlipidemia   . Hypertension   . Paresthesia 09/2014   initially thought to be TIA, neurology consult in 12/2014 with other non TIA considerations.    . Polyarthralgia    normal rheumatoid screen 01/2012    Family History  Problem Relation Age of Onset  . Hypertension Mother   . Arthritis Mother   . GER disease Mother   . Glaucoma Mother   . Breast cancer Mother   . Stroke Father   . Breast cancer Sister        breast cancer dx late 25s  . Hypertension Sister   . Colon cancer Brother 52  . Diabetes Brother   . Diabetes Maternal Aunt   . Heart disease Maternal Aunt   . Heart disease Maternal Grandmother   . Heart  disease Maternal Grandfather   . Heart disease Maternal Uncle   . Stroke Maternal Uncle   . Leukemia Sister   . Hypertension Sister   . Healthy Son   . Colon polyps Neg Hx     Past Surgical History:  Procedure Laterality Date  . COLONOSCOPY  01/2014   diverticulosis, othwerise normal - Dr. Owens Loffler  . ESOPHAGOGASTRODUODENOSCOPY  2013   Dr. Benson Norway, gastritis  . ESOPHAGOGASTRODUODENOSCOPY  [id number]  . EUS N/A 04/16/2014   Procedure: UPPER ENDOSCOPIC ULTRASOUND (EUS) LINEAR;  Surgeon: Milus Banister,  MD;  [facility];  Service: Endoscopy;  Laterality: N/A;  . gall stone surgery    . KNEE ARTHROSCOPY Left   . LAPAROSCOPIC GASTRIC RESECTION N/A 06/09/2014   Procedure: LAPAROSCOPIC GASTRIC MASS RESECTION;  Surgeon: [name], MD;  Location: [facility];  Service: General;  Laterality: N/A;  . LIPOMA EXCISION     forehead  . UTERINE FIBROID SURGERY     Social History   Occupational History  . Occupation: IT sales professional: [employer]  Tobacco Use  . Smoking status: Never Smoker  . Smokeless tobacco: Never Used  Substance and Sexual Activity  . Alcohol use: No    Alcohol/week: 0.0 standard drinks  . Drug use: No  . Sexual activity: Not on file     Garald Balding, MD   Note - This record has been created using [employer].  Chart creation errors have been sought, but may not always  have been located. Such creation errors do not reflect on  the standard of medical care.

## 2018-05-29 ENCOUNTER — Encounter: Payer: Self-pay | Admitting: Physical Therapy

## 2018-05-29 ENCOUNTER — Ambulatory Visit: Payer: BLUE CROSS/BLUE SHIELD | Admitting: Physical Therapy

## 2018-05-29 DIAGNOSIS — R600 Localized edema: Secondary | ICD-10-CM

## 2018-05-29 DIAGNOSIS — M25522 Pain in left elbow: Secondary | ICD-10-CM

## 2018-05-29 DIAGNOSIS — Z79899 Other long term (current) drug therapy: Secondary | ICD-10-CM | POA: Diagnosis not present

## 2018-05-29 DIAGNOSIS — M6281 Muscle weakness (generalized): Secondary | ICD-10-CM

## 2018-05-29 NOTE — Therapy (Signed)
[place], [place], [address] Phone: [phone]   Fax:  [phone]  Physical Therapy Treatment  Patient Details  Name: Lisa Briggs MRN: [id number] Date of Birth: [birth year] Referring Provider (PT): Joni Fears, MD   Encounter Date: 05/29/2018  PT End of Session - 05/29/18 1313    Visit Number  2    Number of Visits  12    Date for PT Re-Evaluation  07/03/18    Authorization Type  BCBS    PT Start Time  0840    PT Stop Time  0943    PT Time Calculation (min)  63 min    Activity Tolerance  Patient tolerated treatment well    Behavior During Therapy  [facility] for tasks assessed/performed       Past Medical History:  Diagnosis Date  . Allergy   . Anemia    iron therapy for years as of 10/12; normal Hgb 08/2013  . Arthritis   . Chest pain 04/05/2011   cardiac eval, normal treadmill stress test, Dr. Tollie Eth  . Chronic back pain   . Constipation   . Farsightedness    wears glasses, [facility] care [facility]  . 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 [name], [facility]  . GERD (gastroesophageal reflux disease)   . History of uterine fibroid   . Hyperlipidemia   . Hypertension   . Paresthesia 09/2014   initially thought to be TIA, neurology consult in 12/2014 with other non TIA considerations.    . Polyarthralgia    normal rheumatoid screen 01/2012    Past Surgical History:  Procedure Laterality Date  . COLONOSCOPY  01/2014   diverticulosis, othwerise normal - Dr. Owens Loffler  . ESOPHAGOGASTRODUODENOSCOPY  2013   Dr. Benson Norway, gastritis  . ESOPHAGOGASTRODUODENOSCOPY  [id number]  . EUS N/A 04/16/2014   Procedure: UPPER ENDOSCOPIC ULTRASOUND (EUS) LINEAR;  Surgeon: Milus Banister, MD;  Location: [facility];  Service: Endoscopy;  Laterality: N/A;  . gall stone surgery    . KNEE ARTHROSCOPY Left   . LAPAROSCOPIC GASTRIC RESECTION N/A 06/09/2014   Procedure:  LAPAROSCOPIC GASTRIC MASS RESECTION;  Surgeon: [name], MD;  Location: [facility];  Service: General;  Laterality: N/A;  . LIPOMA EXCISION     forehead  . UTERINE FIBROID SURGERY      There were no vitals filed for this visit.  Subjective Assessment - 05/29/18 0833    Subjective  feeling about the same.  She is doing the exercises.  Saw MD yesterday.  She is doing OK.  Will return after PT .   Wearing ASO brace.     Currently in Pain?  No/denies   has pulling posterior with exercise.                       [facility] Adult PT Treatment/Exercise - 05/29/18 0001      Elbow Exercises   Elbow Flexion  Strengthening;10 reps    Theraband Level (Elbow Flexion)  Level 2 (Red);Level 3 (Green)   issued green for progression   Elbow Extension  Strengthening    Theraband Level (Elbow Extension)  Level 3 (Green)      Hand Exercises   Other Hand Exercises  grip squeeze with springs (5 LB)  10 X 2 whole hand,  each finger,  fingers challangingg with reproduced wrist pain.   yellow putty issued and worked fingers many ways.  Modalities   Modalities  Ultrasound      Cryotherapy   Number Minutes Cryotherapy  10 Minutes    Cryotherapy Location  --   elbow   Type of Cryotherapy  --   cold pack     Ultrasound   Ultrasound Location  elbow    Ultrasound Parameters  1 watt/cm2 8 minutes  50%    Ultrasound Goals  Edema;Pain      Manual Therapy   Manual therapy comments  retrograde  to decrease edema with lymph,  wrist kinesiotex tape  50 % middle third volar wrist for  pain with ADL.                  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    Baseline  see flowsheet, grossly 4-/5 to 4/5    Time  6    Period  Weeks    Status  New    Target Date  07/03/18      PT LONG TERM GOAL #4   Title  Increase left grip strength to 20 lbs. or greater to improve ability for gripping and lifting activities for work duties    Baseline  13.33 lbs.    Time  6    Period  Weeks    Status  New    Target Date  07/03/18      PT LONG TERM GOAL #5   Title  Perform ADLs/IADLs with left elbow pain <2/10 at worst    Time  6    Period  Weeks    Status  New    Target Date  07/03/18            Plan - 05/29/18 1313    Clinical Impression Statement  Exercises progressed as able today.  Trial of [place] and retrograde soft tissue with  lymph system vacume activation  tissue softened. Objective findings similar to last visit.    PT Next Visit Plan  Review HEP as needed, tx. focus strengthening left elbow and shoulder, ROM, functional activities but include modalities prn for pain and edema managament-consideration ( Assess  today's  session)  [place] vs. ionto    PT Home Exercise Plan  Theraband elbow ext and flex, Theraband row and "punch" Theraband ER, IR    Consulted and Agree with Plan of Care  Patient       Patient will benefit from skilled therapeutic intervention in order to improve the following deficits and impairments:     Visit Diagnosis: Pain in left elbow  Muscle weakness (generalized)  Localized edema     Problem List Patient Active Problem List   Diagnosis  Date Noted  . Left foot pain 04/08/2018  . Olecranon bursitis of left elbow 04/05/2018  . Leg swelling 12/05/2017  . Need for shingles vaccine 12/05/2017  . Primary osteoarthritis of both hands 11/23/2017  . Rheumatoid factor positive 11/23/2017  . Primary osteoarthritis of both  knees 11/23/2017  . Primary osteoarthritis of both feet 11/23/2017  . DDD (degenerative disc disease), lumbar 11/23/2017  . Vaccine counseling 08/13/2017  . Polyarthralgia 08/13/2017  . Joint stiffness 08/13/2017  . Sensitive skin 01/23/2017  . Need for influenza vaccination 01/23/2017  . Proteinuria 01/23/2017  . History of gastrointestinal stromal tumor (GIST) 04/20/2016  . History of TIA (transient ischemic attack) 04/20/2016  . Screening for breast cancer 04/20/2016  . Estrogen deficiency 04/20/2016  . Chronic maxillary sinusitis 04/20/2016  . Impaired fasting blood sugar 04/20/2016  . Constipation 04/20/2016  . History of fall 04/20/2016  . Chronic radicular lumbar pain 01/27/2016  . Encounter for health maintenance examination in adult 03/22/2015  . Paresthesia 03/22/2015  . Cognitive decline 03/22/2015  . Screening for cervical cancer 03/22/2015  . Vitamin D deficiency 03/22/2015  . History of uterine leiomyoma 03/22/2015  . Gastroesophageal reflux disease without esophagitis 03/02/2014  . Chronic nausea 03/02/2014  . Chronic abdominal pain 03/02/2014  . Rhinitis, allergic 03/02/2014  . Essential hypertension 03/02/2014  . Hyperlipidemia 03/02/2014  . Class 1 obesity with serious comorbidity in adult 01/16/2012    [facility] PTA 05/29/2018, 1:19 PM  [facility] [address] [place], [place], [address] Phone: [phone]   Fax:  [phone]  Name: Lisa Briggs MRN: [id number] Date of Birth: May 21, 1960

## 2018-05-30 ENCOUNTER — Other Ambulatory Visit: Payer: Self-pay | Admitting: Rheumatology

## 2018-05-30 NOTE — Telephone Encounter (Signed)
Last Visit: 04/05/18 Next Visit: 09/04/18 Labs: 04/05/18 elevated glucose, Potassium 3.2, WBC 25 Hgb. 11.0 Neutro Abs 22, 625  PLQ Eye Exam: 12/18/17 WNL   Okay to refill PLQ?

## 2018-05-31 ENCOUNTER — Encounter: Payer: Self-pay | Admitting: Physical Therapy

## 2018-05-31 ENCOUNTER — Ambulatory Visit: Payer: BLUE CROSS/BLUE SHIELD | Admitting: Physical Therapy

## 2018-05-31 DIAGNOSIS — M6281 Muscle weakness (generalized): Secondary | ICD-10-CM | POA: Diagnosis not present

## 2018-05-31 DIAGNOSIS — M25522 Pain in left elbow: Secondary | ICD-10-CM | POA: Diagnosis not present

## 2018-05-31 DIAGNOSIS — R600 Localized edema: Secondary | ICD-10-CM | POA: Diagnosis not present

## 2018-05-31 NOTE — Therapy (Signed)
[place], [place], [address] Phone: [phone]   Fax:  [phone]  Physical Therapy Treatment  Patient Details  Name: Lisa Briggs MRN: [id number] Date of Birth: 22-Nov-1960 Referring Provider (PT): Joni Fears, MD   Encounter Date: 05/31/2018  PT End of Session - 05/31/18 0818    Visit Number  3    Number of Visits  12    Date for PT Re-Evaluation  07/03/18    Authorization Type  BCBS    PT Start Time  0801    PT Stop Time  0856    PT Time Calculation (min)  55 min    Activity Tolerance  Patient tolerated treatment well   difficulty wall push ups otherwise tx. well-tolerated   Behavior During Therapy  [facility] for tasks assessed/performed       Past Medical History:  Diagnosis Date  . Allergy   . Anemia    iron therapy for years as of 10/12; normal Hgb 08/2013  . Arthritis   . Chest pain 04/05/2011   cardiac eval, normal treadmill stress test, Dr. Tollie Eth  . Chronic back pain   . Constipation   . Farsightedness    wears glasses, Eye care center  . 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 [name], [facility]  . GERD (gastroesophageal reflux disease)   . History of uterine fibroid   . Hyperlipidemia   . Hypertension   . Paresthesia 09/2014   initially thought to be TIA, neurology consult in 12/2014 with other non TIA considerations.    . Polyarthralgia    normal rheumatoid screen 01/2012    Past Surgical History:  Procedure Laterality Date  . COLONOSCOPY  01/2014   diverticulosis, othwerise normal - Dr. Owens Loffler  . ESOPHAGOGASTRODUODENOSCOPY  2013   Dr. Benson Norway, gastritis  . ESOPHAGOGASTRODUODENOSCOPY  [id number]  . EUS N/A 04/16/2014   Procedure: UPPER ENDOSCOPIC ULTRASOUND (EUS) LINEAR;  Surgeon: Milus Banister, MD;  [facility];  Service: Endoscopy;  Laterality: N/A;  . gall stone surgery    . KNEE ARTHROSCOPY Left   .  LAPAROSCOPIC GASTRIC RESECTION N/A 06/09/2014   Procedure: LAPAROSCOPIC GASTRIC MASS RESECTION;  Surgeon: [name], MD;  [facility];  Service: General;  Laterality: N/A;  . LIPOMA EXCISION     forehead  . UTERINE FIBROID SURGERY      There were no vitals filed for this visit.  Subjective Assessment - 05/31/18 0858    Subjective  Still having pain in elbow with activity at times but no pain pre-tx. today. No questions re: HEP.    Currently in Pain?  No/denies                       [facility] Adult PT Treatment/Exercise - 05/31/18 0001      Elbow Exercises   Elbow Flexion  Strengthening;Left   3x10   Bar Weights/Barbell (Elbow Flexion)  4 lbs    Elbow Extension  Strengthening;Left;20 reps    Theraband Level (Elbow Extension)  Level 2 (Red)   red band due to soreness after wall push up     Shoulder Exercises: Prone   Other Prone Exercises  row 2x10 with 3 lb, prone ext 2x10 with 2 lb,      Shoulder Exercises: Standing   Flexion  Left;20 reps    Shoulder Flexion Weight (lbs)  2    ABduction  --   scaption  2 lb. 2x10   Other Standing Exercises  lift crate from chair to table x 10 with 90 deg turn to set on mat, x 10 from stool to table-, crate 8 lb. unweighted with 5 lb. added    Other Standing Exercises  wall push up x 7 reps stopped due to soreness      Shoulder Exercises: ROM/Strengthening   UBE (Upper Arm Bike)  L0 x 4 min with 2 min ea. fw/rev      Hand Exercises   Other Hand Exercises  grip squeeze with springs 5 lbs. 2x10      Cryotherapy   Number Minutes Cryotherapy  10 Minutes    Cryotherapy Location  --   Elbow   Type of Cryotherapy  Ice pack      Ultrasound   Ultrasound Location  elbow   left olecranon region   Ultrasound Parameters  1 W/cm2 3 MHZ 50% x 8 min    Ultrasound Goals  Edema;Pain      Manual Therapy   Manual therapy comments  retrograde STM left elbow/proximal arm for edema             PT Education - 05/31/18 0818     Education Details  POC, time needed for strengthening results, use of cryo at home, delayed onset muscle soreness    Person(s) Educated  Patient    Methods  Explanation    Comprehension  Verbalized understanding          PT Long Term Goals - 05/22/18 2124      PT LONG TERM GOAL #1   Title  Independent with HEP    Baseline  needs new HEP    Time  6    Period  Weeks    Status  New    Target Date  07/03/18      PT LONG TERM GOAL #2   Title  Improve FOTO outcome measure score to 43% or less impairment    Baseline  64% limited    Time  6    Period  Weeks    Status  New    Target Date  07/03/18      PT LONG TERM GOAL #3   Title  Left elbow and shoulder strength grossly 5/5 to improve ability for 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    Baseline  see flowsheet, grossly 4-/5 to 4/5    Time  6    Period  Weeks    Status  New    Target Date  07/03/18      PT LONG TERM GOAL #4   Title  Increase left grip strength to 20 lbs. or greater to improve ability for gripping and lifting activities for work duties    Baseline  13.33 lbs.    Time  6    Period  Weeks    Status  New    Target Date  07/03/18      PT LONG TERM GOAL #5   Title  Perform ADLs/IADLs with left elbow pain <2/10 at worst    Time  6    Period  Weeks    Status  New    Target Date  07/03/18            Plan - 05/31/18 2536    Clinical Impression Statement  Continued exercise progression with addition UBE and light lifting activities with crate to simulate work activities. Exercises well-tolerated excepting had pain  in elbow with close chain motion for wall push ups. Expect progress with strengthening will be ongoing given weakness noted at evaluation.    Clinical Presentation  Stable    Clinical Decision Making  Low    Rehab Potential  Good    PT Frequency  2x / week    PT Duration  6 weeks    PT Next Visit Plan  Progress elbow/shoulder ROM and strengthening as tolerated  pending pain, continue modalities, manual tx. prn to address edema, pain.    PT Home Exercise Plan  Theraband elbow ext and flex, Theraband row and "punch" Theraband ER, IR    Consulted and Agree with Plan of Care  Patient       Patient will benefit from skilled therapeutic intervention in order to improve the following deficits and impairments:  Pain, Increased edema, Impaired UE functional use, Decreased range of motion, Decreased strength  Visit Diagnosis: Pain in left elbow  Muscle weakness (generalized)  Localized edema     Problem List Patient Active Problem List   Diagnosis Date Noted  . Left foot pain 04/08/2018  . Olecranon bursitis of left elbow 04/05/2018  . Leg swelling 12/05/2017  . Need for shingles vaccine 12/05/2017  . Primary osteoarthritis of both hands 11/23/2017  . Rheumatoid factor positive 11/23/2017  . Primary osteoarthritis of both knees 11/23/2017  . Primary osteoarthritis of both feet 11/23/2017  . DDD (degenerative disc disease), lumbar 11/23/2017  . Vaccine counseling 08/13/2017  . Polyarthralgia 08/13/2017  . Joint stiffness 08/13/2017  . Sensitive skin 01/23/2017  . Need for influenza vaccination 01/23/2017  . Proteinuria 01/23/2017  . History of gastrointestinal stromal tumor (GIST) 04/20/2016  . History of TIA (transient ischemic attack) 04/20/2016  . Screening for breast cancer 04/20/2016  . Estrogen deficiency 04/20/2016  . Chronic maxillary sinusitis 04/20/2016  . Impaired fasting blood sugar 04/20/2016  . Constipation 04/20/2016  . History of fall 04/20/2016  . Chronic radicular lumbar pain 01/27/2016  . Encounter for health maintenance examination in adult 03/22/2015  . Paresthesia 03/22/2015  . Cognitive decline 03/22/2015  . Screening for cervical cancer 03/22/2015  . Vitamin D deficiency 03/22/2015  . History of uterine leiomyoma 03/22/2015  . Gastroesophageal reflux disease without esophagitis 03/02/2014  . Chronic nausea  03/02/2014  . Chronic abdominal pain 03/02/2014  . Rhinitis, allergic 03/02/2014  . Essential hypertension 03/02/2014  . Hyperlipidemia 03/02/2014  . Class 1 obesity with serious comorbidity in adult 01/16/2012    Beaulah Dinning, PT, DPT 05/31/18 8:58 AM  [facility] [address] [place], [place], [address] Phone: [phone]   Fax:  [phone]  Name: Lisa Briggs MRN: [id number] Date of Birth: Nov 12, 1960

## 2018-06-03 ENCOUNTER — Ambulatory Visit: Payer: BLUE CROSS/BLUE SHIELD | Attending: Orthopaedic Surgery | Admitting: Physical Therapy

## 2018-06-03 ENCOUNTER — Encounter: Payer: Self-pay | Admitting: Physical Therapy

## 2018-06-03 ENCOUNTER — Encounter: Payer: Self-pay | Admitting: Medical

## 2018-06-03 DIAGNOSIS — M25522 Pain in left elbow: Secondary | ICD-10-CM | POA: Diagnosis not present

## 2018-06-03 DIAGNOSIS — R600 Localized edema: Secondary | ICD-10-CM | POA: Insufficient documentation

## 2018-06-03 DIAGNOSIS — M6281 Muscle weakness (generalized): Secondary | ICD-10-CM

## 2018-06-03 NOTE — Therapy (Signed)
[place], [place], [address] Phone: [phone]   Fax:  [phone]  Physical Therapy Treatment  Patient Details  Name: Lisa Briggs MRN: [id number] Date of Birth: [birth year] Referring Provider (PT): Joni Fears, MD   Encounter Date: 06/03/2018  PT End of Session - 06/03/18 1548    Visit Number  4    Number of Visits  12    Date for PT Re-Evaluation  07/03/18    Authorization Type  BCBS    PT Start Time  1505    PT Stop Time  1559    PT Time Calculation (min)  54 min    Activity Tolerance  Patient tolerated treatment well    Behavior During Therapy  [facility] for tasks assessed/performed       Past Medical History:  Diagnosis Date  . Allergy   . Anemia    iron therapy for years as of 10/12; normal Hgb 08/2013  . Arthritis   . Chest pain 04/05/2011   cardiac eval, normal treadmill stress test, Dr. Tollie Eth  . Chronic back pain   . Constipation   . Farsightedness    wears glasses, Eye care center  . 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 [name], [facility]  . GERD (gastroesophageal reflux disease)   . History of uterine fibroid   . Hyperlipidemia   . Hypertension   . Paresthesia 09/2014   initially thought to be TIA, neurology consult in 12/2014 with other non TIA considerations.    . Polyarthralgia    normal rheumatoid screen 01/2012    Past Surgical History:  Procedure Laterality Date  . COLONOSCOPY  01/2014   diverticulosis, othwerise normal - Dr. Owens Loffler  . ESOPHAGOGASTRODUODENOSCOPY  2013   Dr. Benson Norway, gastritis  . ESOPHAGOGASTRODUODENOSCOPY  [id number]  . EUS N/A 04/16/2014   Procedure: UPPER ENDOSCOPIC ULTRASOUND (EUS) LINEAR;  Surgeon: Milus Banister, MD;  [facility];  Service: Endoscopy;  Laterality: N/A;  . gall stone surgery    . KNEE ARTHROSCOPY Left   . LAPAROSCOPIC GASTRIC RESECTION N/A 06/09/2014   Procedure:  LAPAROSCOPIC GASTRIC MASS RESECTION;  Surgeon: [name], MD;  [facility]: [facility];  Service: General;  Laterality: N/A;  . LIPOMA EXCISION     forehead  . UTERINE FIBROID SURGERY      There were no vitals filed for this visit.  Subjective Assessment - 06/03/18 1508    Subjective  No pain   I have been using ice    Currently in Pain?  No/denies                       [facility] Adult PT Treatment/Exercise - 06/03/18 0001      Therapeutic Activites    Therapeutic Activities  Work Simulation    Work Simulation  sled push with and without 10 LB added weight,  turning sled around also to simulate cart at work,  box lift  to shelf,  simulated placing 2.5 and 5 LB weighte discs into box at shoulder height,  practiced lowering box from shelf too floor .  she was able to tolerate light lifting without pain increase.       Elbow Exercises   Elbow Flexion  Strengthening    Theraband Level (Elbow Flexion)  Level 3 (Green)   3 sets of 10   Bar Weights/Barbell (Elbow Flexion)  4 lbs   10 X   Elbow Extension  Strengthening   3 sets of 10    Theraband Level (Elbow Extension)  Level 3 (Green)    Elbow Extension Limitations  elbow sore resting on pillow   touching   Bar Weights/Barbell (Forearm Supination)  4 lbs    Forearm Supination Limitations  cued    Other elbow exercises  3 way wrist with barbell     sore with radial deviation at wrist.  Soreness addressed with Kinesiotex tape.      Shoulder Exercises: Standing   Flexion  Left;20 reps    Shoulder Flexion Weight (lbs)  2    Flexion Limitations  3 shelf tap at counter      Hand Exercises   Other Hand Exercises  grip squeeze with springs 5 lbs.  multiple squeezes      Cryotherapy   Cryotherapy Location  --   elbow  10 minutes   Type of Cryotherapy  --   cold pack     Ultrasound   Ultrasound Location   Not done due to no elbow pain  PT Agustin Cree informed      Manual Therapy   Manual Therapy  Taping   wrist 50 %  Kinesiotex dorsum wrist for comfort,   Manual therapy comments  retrograde elbow wrist,  tape 50 % middle 1/3 dorsum wrist at patient request .  Edema improving,  elbow sking intact, no reddness or signs of broken skin.    alcohol burned skin at elbow for tape trial  did not do            PT Education - 06/03/18 1803    Education Details  Exercise form.  The need to wean from [place] when she has no pain.       Person(s) Educated  Patient    Methods  Explanation;Demonstration;Tactile cues;Verbal cues    Comprehension  Returned demonstration;Verbalized understanding          PT Long Term Goals - 06/03/18 1815      PT LONG TERM GOAL #1   Title  Independent with HEP    Baseline  says she is doing them at home    Time  6    Period  Weeks    Status  On-going      PT LONG TERM GOAL #2   Title  Improve FOTO outcome measure score to 43% or less impairment    Time  6    Period  Weeks    Status  Unable to assess      PT LONG TERM GOAL #3   Title  Left elbow and shoulder strength grossly 5/5 to improve ability for 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    Time  6    Period  Weeks    Status  Unable to assess      PT LONG TERM GOAL #4   Title  Increase left grip strength to 20 lbs. or greater to improve ability for gripping and lifting activities for work duties    Baseline  adressing with exercise    Time  6    Period  Weeks    Status  On-going      PT LONG TERM GOAL #5   Title  Perform ADLs/IADLs with left elbow pain <2/10 at worst    Baseline  no elbow pain today.  not back to work yet,  elbow sore resting on pillow    Time  6  Period  Weeks    Status  On-going            Plan - 06/03/18 1804    Clinical Impression Statement   Focus today was on strengthening and simulation of job tasks. Patient had no pain in elbow today.  Patient was able to push sled with 10 LB added weight without pain,lifting/ placing things on shelf  4,  5 LBS   at shoulder height  and others.  Patient did not see the value in these tasks  expressing her desire for [place] and other exercise.  She  also  wanted a full hour of PT , She was informed our sessions run roughly 38 to 45 minutes one on one.  (with the addition of cold not attended).   PT  Agustin Cree was informed    PT Next Visit Plan  Progress elbow/shoulder ROM and strengthening as tolerated pending pain, continue modalities, manual tx. prn to address edema, pain.  Start on time. if able.     PT Home Exercise Plan  Theraband elbow ext and flex, Theraband row and "punch" Theraband ER, IR    Consulted and Agree with Plan of Care  Patient       Patient will benefit from skilled therapeutic intervention in order to improve the following deficits and impairments:     Visit Diagnosis: Pain in left elbow  Muscle weakness (generalized)  Localized edema     Problem List Patient Active Problem List   Diagnosis Date Noted  . Left foot pain 04/08/2018  . Olecranon bursitis of left elbow 04/05/2018  . Leg swelling 12/05/2017  . Need for shingles vaccine 12/05/2017  . Primary osteoarthritis of both hands 11/23/2017  . Rheumatoid factor positive 11/23/2017  . Primary osteoarthritis of both knees 11/23/2017  . Primary osteoarthritis of both feet 11/23/2017  . DDD (degenerative disc disease), lumbar 11/23/2017  . Vaccine counseling 08/13/2017  . Polyarthralgia 08/13/2017  . Joint stiffness 08/13/2017  . Sensitive skin 01/23/2017  . Need for influenza vaccination 01/23/2017  . Proteinuria 01/23/2017  . History of gastrointestinal stromal tumor (GIST) 04/20/2016  . History of TIA (transient ischemic attack) 04/20/2016  . Screening for breast cancer 04/20/2016  . Estrogen deficiency 04/20/2016  . Chronic maxillary sinusitis 04/20/2016  . Impaired fasting blood sugar 04/20/2016  . Constipation 04/20/2016  . History of fall 04/20/2016  . Chronic radicular lumbar pain 01/27/2016  . Encounter for  health maintenance examination in adult 03/22/2015  . Paresthesia 03/22/2015  . Cognitive decline 03/22/2015  . Screening for cervical cancer 03/22/2015  . Vitamin D deficiency 03/22/2015  . History of uterine leiomyoma 03/22/2015  . Gastroesophageal reflux disease without esophagitis 03/02/2014  . Chronic nausea 03/02/2014  . Chronic abdominal pain 03/02/2014  . Rhinitis, allergic 03/02/2014  . Essential hypertension 03/02/2014  . Hyperlipidemia 03/02/2014  . Class 1 obesity with serious comorbidity in adult 01/16/2012    [facility] PTA 06/03/2018, 6:22 PM  [facility] [address] [place], [place], [address] Phone: [phone]   Fax:  [phone]  Name: Lisa Briggs MRN: [id number] Date of Birth: 1961/04/02

## 2018-06-06 ENCOUNTER — Ambulatory Visit: Payer: BLUE CROSS/BLUE SHIELD | Admitting: Physical Therapy

## 2018-06-06 ENCOUNTER — Encounter: Payer: Self-pay | Admitting: Physical Therapy

## 2018-06-06 DIAGNOSIS — M6281 Muscle weakness (generalized): Secondary | ICD-10-CM

## 2018-06-06 DIAGNOSIS — R600 Localized edema: Secondary | ICD-10-CM

## 2018-06-06 DIAGNOSIS — M25522 Pain in left elbow: Secondary | ICD-10-CM | POA: Diagnosis not present

## 2018-06-06 NOTE — Therapy (Signed)
[place], [place], [address] Phone: [phone]   Fax:  [phone]  Physical Therapy Treatment  Patient Details  Name: Lisa Briggs MRN: [id number] Date of Birth: 1960/08/28 Referring Provider (PT): Joni Fears, MD   Encounter Date: 06/06/2018  PT End of Session - 06/06/18 1504    Visit Number  5    Number of Visits  12    Date for PT Re-Evaluation  07/03/18    Authorization Type  BCBS    PT Start Time  9622    PT Stop Time  1512    PT Time Calculation (min)  57 min    Activity Tolerance  Patient tolerated treatment well    Behavior During Therapy  [facility] for tasks assessed/performed       Past Medical History:  Diagnosis Date  . Allergy   . Anemia    iron therapy for years as of 10/12; normal Hgb 08/2013  . Arthritis   . Chest pain 04/05/2011   cardiac eval, normal treadmill stress test, Dr. Tollie Eth  . Chronic back pain   . Constipation   . Farsightedness    wears glasses, [facility] care center  . 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 [name], [facility]  . GERD (gastroesophageal reflux disease)   . History of uterine fibroid   . Hyperlipidemia   . Hypertension   . Paresthesia 09/2014   initially thought to be TIA, neurology consult in 12/2014 with other non TIA considerations.    . Polyarthralgia    normal rheumatoid screen 01/2012    Past Surgical History:  Procedure Laterality Date  . COLONOSCOPY  01/2014   diverticulosis, othwerise normal - Dr. Owens Loffler  . ESOPHAGOGASTRODUODENOSCOPY  2013   Dr. Benson Norway, gastritis  . ESOPHAGOGASTRODUODENOSCOPY  [id number]  . EUS N/A 04/16/2014   Procedure: UPPER ENDOSCOPIC ULTRASOUND (EUS) LINEAR;  Surgeon: Milus Banister, MD;  [facility]: [facility];  Service: Endoscopy;  Laterality: N/A;  . gall stone surgery    . KNEE ARTHROSCOPY Left   . LAPAROSCOPIC GASTRIC RESECTION N/A 06/09/2014   Procedure:  LAPAROSCOPIC GASTRIC MASS RESECTION;  Surgeon: [name], MD;  Location: [facility];  Service: General;  Laterality: N/A;  . LIPOMA EXCISION     forehead  . UTERINE FIBROID SURGERY      There were no vitals filed for this visit.  Subjective Assessment - 06/06/18 1417    Subjective  Some general "body" soreness which pt. attributes to weather. No elbow pain pre-tx. today     Diagnostic tests  X-rays    Patient Stated Goals  Improve strength in elbow for return to work    Currently in Pain?  No/denies         [facility] PT Assessment - 06/06/18 0001      Strength   Overall Strength Comments  left grip 32 lbs.    Left Shoulder Flexion  4+/5    Left Shoulder ABduction  4/5    Left Shoulder Internal Rotation  5/5    Left Shoulder External Rotation  4+/5    Left Elbow Flexion  5/5    Left Elbow Extension  4/5                   [facility] Adult PT Treatment/Exercise - 06/06/18 0001      Elbow Exercises   Elbow Flexion  Strengthening;Left;20 reps    Bar Weights/Barbell (Elbow Flexion)  4 lbs  Elbow Extension  Strengthening;Left;20 reps    Bar Weights/Barbell (Elbow Extension)  2 lbs      Shoulder Exercises: Supine   Other Supine Exercises  left unilat. DB press 4 lb. 2x10      Shoulder Exercises: Sidelying   External Rotation  Left;20 reps    External Rotation Weight (lbs)  2      Shoulder Exercises: Standing   Row  Strengthening;Left;20 reps    Row Weight (lbs)  --   5 lb. 1st set, 7 lb. 2nd set   Diagonals  --   D1 3 lb. 2x10, D2 2 lb. 2x10   Other Standing Exercises  lift crate from stool to counter with 90 deg turn x 10, 8 lb. crate with 10 lb. initially, too light per. pt. so increased to 2 plates for 28 lb. , too havy so decreased to 22 lb.s with good tolerance    Other Standing Exercises  body blade 20 sec x 3 at 90 deg elbow flex      Shoulder Exercises: ROM/Strengthening   UBE (Upper Arm Bike)  L1 x 5 min with 2.5 min ea. fw/rev      Cryotherapy    Number Minutes Cryotherapy  10 Minutes    Cryotherapy Location  --   elbow   Type of Cryotherapy  Ice pack      Ultrasound   Ultrasound Location  elbow   left olecranon region   Ultrasound Parameters  3 MHZ 50% 1.0 W/cm2    Ultrasound Goals  Edema;Pain      Manual Therapy   Manual Therapy  Soft tissue mobilization    Soft tissue mobilization  retrograde STM left elbow region             PT Education - 06/06/18 1504    Education Details  exercises/form, progress    Person(s) Educated  Patient    Methods  Explanation    Comprehension  Verbalized understanding          PT Long Term Goals - 06/06/18 1506      PT LONG TERM GOAL #1   Title  Independent with HEP    Baseline  met    Time  6    Period  Weeks    Status  Achieved      PT LONG TERM GOAL #2   Title  Improve FOTO outcome measure score to 43% or less impairment    Baseline  64% limited at eval, not retested today    Time  6    Period  Weeks    Status  On-going      PT LONG TERM GOAL #3   Title  Left elbow and shoulder strength grossly 5/5 to improve ability for 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    Baseline  see flowsheet, improved but not met    Time  6    Period  Weeks    Status  On-going      PT LONG TERM GOAL #4   Title  Increase left grip strength to 20 lbs. or greater to improve ability for gripping and lifting activities for work duties    Baseline  see flowsheet, improved from 13.33. lbs. at eval to current 32 lb.    Time  6    Period  Weeks    Status  On-going      PT LONG TERM GOAL #5   Title  Perform ADLs/IADLs with left  elbow pain <2/10 at worst    Baseline  no elbow pain today.  not back to work yet,  elbow sore resting on pillow    Time  6    Period  Weeks    Status  On-going            Plan - 06/06/18 1505    Clinical Impression Statement  Tx. focus strengthening to address left UE weakness-see flowsheet-improving from baseline status  with left elbow, shoulder and grip strength. Used [place] due to edema left elbow but as noted last session plan wean from this as symptoms improve for more time for strengthening emphasis.    Stability/Clinical Decision Making  Stable/Uncomplicated    Clinical Decision Making  Low    Rehab Potential  Good    PT Frequency  2x / week    PT Duration  6 weeks    PT Treatment/Interventions  Electrical Stimulation;ADLs/Self Care Home Management;Cryotherapy;Ultrasound;Moist Heat;Iontophoresis 63m/ml Dexamethasone;Therapeutic activities;Patient/family education;Therapeutic exercise;Neuromuscular re-education;Manual techniques;Dry needling;Taping;Vasopneumatic Device    PT Next Visit Plan  Progress elbow/shoulder ROM and strengthening as tolerated pending pain, continue modalities, manual tx. prn to address edema, pain.  Start on time. if able.     PT Home Exercise Plan  Theraband elbow ext and flex, Theraband row and "punch" Theraband ER, IR    Consulted and Agree with Plan of Care  Patient       Patient will benefit from skilled therapeutic intervention in order to improve the following deficits and impairments:  Pain, Increased edema, Impaired UE functional use, Decreased range of motion, Decreased strength  Visit Diagnosis: Pain in left elbow  Muscle weakness (generalized)  Localized edema     Problem List Patient Active Problem List   Diagnosis Date Noted  . Left foot pain 04/08/2018  . Olecranon bursitis of left elbow 04/05/2018  . Leg swelling 12/05/2017  . Need for shingles vaccine 12/05/2017  . Primary osteoarthritis of both hands 11/23/2017  . Rheumatoid factor positive 11/23/2017  . Primary osteoarthritis of both knees 11/23/2017  . Primary osteoarthritis of both feet 11/23/2017  . DDD (degenerative disc disease), lumbar 11/23/2017  . Vaccine counseling 08/13/2017  . Polyarthralgia 08/13/2017  . Joint stiffness 08/13/2017  . Sensitive skin 01/23/2017  . Need for influenza  vaccination 01/23/2017  . Proteinuria 01/23/2017  . History of gastrointestinal stromal tumor (GIST) 04/20/2016  . History of TIA (transient ischemic attack) 04/20/2016  . Screening for breast cancer 04/20/2016  . Estrogen deficiency 04/20/2016  . Chronic maxillary sinusitis 04/20/2016  . Impaired fasting blood sugar 04/20/2016  . Constipation 04/20/2016  . History of fall 04/20/2016  . Chronic radicular lumbar pain 01/27/2016  . Encounter for health maintenance examination in adult 03/22/2015  . Paresthesia 03/22/2015  . Cognitive decline 03/22/2015  . Screening for cervical cancer 03/22/2015  . Vitamin D deficiency 03/22/2015  . History of uterine leiomyoma 03/22/2015  . Gastroesophageal reflux disease without esophagitis 03/02/2014  . Chronic nausea 03/02/2014  . Chronic abdominal pain 03/02/2014  . Rhinitis, allergic 03/02/2014  . Essential hypertension 03/02/2014  . Hyperlipidemia 03/02/2014  . Class 1 obesity with serious comorbidity in adult 01/16/2012    CBeaulah Dinning PT, DPT 06/06/18 3:10 PM  [facility] [address]Phone: [phone]  Fax:  [phone] Name: Lisa NealMRN: [id number] of Birth: 3Sep 07, 1962

## 2018-06-11 ENCOUNTER — Ambulatory Visit: Payer: BLUE CROSS/BLUE SHIELD | Admitting: Physical Therapy

## 2018-06-11 ENCOUNTER — Telehealth: Payer: Self-pay | Admitting: Physical Therapy

## 2018-06-11 NOTE — Telephone Encounter (Signed)
Attempted to call patient regarding no call/no show. No answer so left voicemail regarding missed visit and to confirm next scheduled appointment.

## 2018-06-12 ENCOUNTER — Encounter: Payer: Self-pay | Admitting: Physical Therapy

## 2018-06-12 ENCOUNTER — Ambulatory Visit: Payer: BLUE CROSS/BLUE SHIELD | Admitting: Physical Therapy

## 2018-06-12 ENCOUNTER — Other Ambulatory Visit: Payer: Self-pay

## 2018-06-12 DIAGNOSIS — M25522 Pain in left elbow: Secondary | ICD-10-CM

## 2018-06-12 DIAGNOSIS — M6281 Muscle weakness (generalized): Secondary | ICD-10-CM

## 2018-06-12 DIAGNOSIS — R600 Localized edema: Secondary | ICD-10-CM

## 2018-06-12 NOTE — Therapy (Signed)
[place], [place], [address] Phone: [phone]   Fax:  [phone]  Physical Therapy Treatment  Patient Details  Name: Lisa Briggs MRN: [id number] Date of Birth: Apr 05, 1960 Referring Provider (PT): Joni Fears, MD   Encounter Date: 06/12/2018  PT End of Session - 06/12/18 1511    Visit Number  6    Number of Visits  12    Date for PT Re-Evaluation  07/03/18    Authorization Type  BCBS    PT Start Time  4492    PT Stop Time  1512    PT Time Calculation (min)  60 min    Activity Tolerance  Patient tolerated treatment well    Behavior During Therapy  [facility] for tasks assessed/performed       Past Medical History:  Diagnosis Date  . Allergy   . Anemia    iron therapy for years as of 10/12; normal Hgb 08/2013  . Arthritis   . Chest pain 04/05/2011   cardiac eval, normal treadmill stress test, Dr. Tollie Eth  . Chronic back pain   . Constipation   . Farsightedness    wears glasses, Eye care center  . 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 [name], [facility]  . GERD (gastroesophageal reflux disease)   . History of uterine fibroid   . Hyperlipidemia   . Hypertension   . Paresthesia 09/2014   initially thought to be TIA, neurology consult in 12/2014 with other non TIA considerations.    . Polyarthralgia    normal rheumatoid screen 01/2012    Past Surgical History:  Procedure Laterality Date  . COLONOSCOPY  01/2014   diverticulosis, othwerise normal - Dr. Owens Loffler  . ESOPHAGOGASTRODUODENOSCOPY  2013   Dr. Benson Norway, gastritis  . ESOPHAGOGASTRODUODENOSCOPY  [id number]  . EUS N/A 04/16/2014   Procedure: UPPER ENDOSCOPIC ULTRASOUND (EUS) LINEAR;  Surgeon: Milus Banister, MD;  [facility];  Service: Endoscopy;  Laterality: N/A;  . gall stone surgery    . KNEE ARTHROSCOPY Left   . LAPAROSCOPIC GASTRIC RESECTION N/A 06/09/2014   Procedure:  LAPAROSCOPIC GASTRIC MASS RESECTION;  Surgeon: [name], MD;  Location: [facility];  Service: General;  Laterality: N/A;  . LIPOMA EXCISION     forehead  . UTERINE FIBROID SURGERY      There were no vitals filed for this visit.  Subjective Assessment - 06/12/18 1510    Subjective  Pt. reports missed visit yesterday due to sore, "aching all over" due to weather. She reports was unable to call to cancel due to power not working/phone down. Notes soreness after periods where are is still and waking up with soreness.    Patient Stated Goals  Improve strength in elbow for return to work    Currently in Pain?  No/denies                       [facility] Adult PT Treatment/Exercise - 06/12/18 0001      Elbow Exercises   Elbow Flexion  --   3x10   Bar Weights/Barbell (Elbow Flexion)  4 lbs    Elbow Extension  Strengthening;Left;20 reps    Theraband Level (Elbow Extension)  Level 3 (Green)   unilat. with band anchored over doorway     Shoulder Exercises: Standing   Flexion  Strengthening;Left;20 reps    Shoulder Flexion Weight (lbs)  5   started with 2 then 3  lbs. but too light   ABduction  Strengthening;Left;20 reps    Shoulder ABduction Weight (lbs)  3    Other Standing Exercises  lift crate (8 lb. unweighted with 20 additional lb.) from floor to waist x 15 reps      Shoulder Exercises: ROM/Strengthening   UBE (Upper Arm Bike)  L1 x 5 min with 2.5 min ea. fw/rev    Cybex Press  20 reps   20 lbs. 1st set, 15 lbs. 2nd set   Cybex Row  20 reps   25 lbs.      Cryotherapy   Number Minutes Cryotherapy  15 Minutes    Cryotherapy Location  Upper arm   elbow   Type of Cryotherapy  Ice pack      Ultrasound   Ultrasound Location  elbow   left olecranon region   Ultrasound Parameters  3 MHZ 50% 1.0 W/cm2    Ultrasound Goals  Edema;Pain      Manual Therapy   Manual Therapy  Joint mobilization    Joint Mobilization  gentle left elbow distraction mobilization grade I-III     Soft tissue mobilization  retrograde STM left elbow for edema                  PT Long Term Goals - 06/06/18 1506      PT LONG TERM GOAL #1   Title  Independent with HEP    Baseline  met    Time  6    Period  Weeks    Status  Achieved      PT LONG TERM GOAL #2   Title  Improve FOTO outcome measure score to 43% or less impairment    Baseline  64% limited at eval, not retested today    Time  6    Period  Weeks    Status  On-going      PT LONG TERM GOAL #3   Title  Left elbow and shoulder strength grossly 5/5 to improve ability for 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    Baseline  see flowsheet, improved but not met    Time  6    Period  Weeks    Status  On-going      PT LONG TERM GOAL #4   Title  Increase left grip strength to 20 lbs. or greater to improve ability for gripping and lifting activities for work duties    Baseline  see flowsheet, improved from 13.33. lbs. at eval to current 32 lb.    Time  6    Period  Weeks    Status  On-going      PT LONG TERM GOAL #5   Title  Perform ADLs/IADLs with left elbow pain <2/10 at worst    Baseline  no elbow pain today.  not back to work yet,  elbow sore resting on pillow    Time  6    Period  Weeks    Status  On-going            Plan - 06/12/18 1514    Clinical Impression Statement  Tendency soreness at rest likely associated with inflammation. Pt. continues to gradually improve from previous status with left UE strength gains and functional abiliites for reaching and lifting.    Stability/Clinical Decision Making  Stable/Uncomplicated    Clinical Decision Making  Low    Rehab Potential  Good    PT Frequency  2x /  week    PT Duration  6 weeks    PT Treatment/Interventions  Electrical Stimulation;ADLs/Self Care Home Management;Cryotherapy;Ultrasound;Moist Heat;Iontophoresis 88m/ml Dexamethasone;Therapeutic activities;Patient/family education;Therapeutic exercise;Neuromuscular  re-education;Manual techniques;Dry needling;Taping;Vasopneumatic Device    PT Next Visit Plan  Progress elbow/shoulder ROM and strengthening as tolerated pending pain, continue modalities, manual tx. prn to address edema, pain.      PT Home Exercise Plan  Theraband elbow ext and flex, Theraband row and "punch" Theraband ER, IR    Consulted and Agree with Plan of Care  Patient       Patient will benefit from skilled therapeutic intervention in order to improve the following deficits and impairments:  Pain, Increased edema, Impaired UE functional use, Decreased range of motion, Decreased strength  Visit Diagnosis: Pain in left elbow  Muscle weakness (generalized)  Localized edema     Problem List Patient Active Problem List   Diagnosis Date Noted  . Left foot pain 04/08/2018  . Olecranon bursitis of left elbow 04/05/2018  . Leg swelling 12/05/2017  . Need for shingles vaccine 12/05/2017  . Primary osteoarthritis of both hands 11/23/2017  . Rheumatoid factor positive 11/23/2017  . Primary osteoarthritis of both knees 11/23/2017  . Primary osteoarthritis of both feet 11/23/2017  . DDD (degenerative disc disease), lumbar 11/23/2017  . Vaccine counseling 08/13/2017  . Polyarthralgia 08/13/2017  . Joint stiffness 08/13/2017  . Sensitive skin 01/23/2017  . Need for influenza vaccination 01/23/2017  . Proteinuria 01/23/2017  . History of gastrointestinal stromal tumor (GIST) 04/20/2016  . History of TIA (transient ischemic attack) 04/20/2016  . Screening for breast cancer 04/20/2016  . Estrogen deficiency 04/20/2016  . Chronic maxillary sinusitis 04/20/2016  . Impaired fasting blood sugar 04/20/2016  . Constipation 04/20/2016  . History of fall 04/20/2016  . Chronic radicular lumbar pain 01/27/2016  . Encounter for health maintenance examination in adult 03/22/2015  . Paresthesia 03/22/2015  . Cognitive decline 03/22/2015  . Screening for cervical cancer 03/22/2015  . Vitamin  D deficiency 03/22/2015  . History of uterine leiomyoma 03/22/2015  . Gastroesophageal reflux disease without esophagitis 03/02/2014  . Chronic nausea 03/02/2014  . Chronic abdominal pain 03/02/2014  . Rhinitis, allergic 03/02/2014  . Essential hypertension 03/02/2014  . Hyperlipidemia 03/02/2014  . Class 1 obesity with serious comorbidity in adult 01/16/2012    CBeaulah Dinning PT, DPT 06/12/18 3:17 PM  [facility] DriveG[place] [address]Phone: [phone]  Fax:  [phone] Name: Lisa RohMRN: [id number] of Birth: 331-Oct-1962

## 2018-06-13 ENCOUNTER — Ambulatory Visit: Payer: BLUE CROSS/BLUE SHIELD | Admitting: Physical Therapy

## 2018-06-13 DIAGNOSIS — M25522 Pain in left elbow: Secondary | ICD-10-CM | POA: Diagnosis not present

## 2018-06-13 DIAGNOSIS — R600 Localized edema: Secondary | ICD-10-CM

## 2018-06-13 DIAGNOSIS — M6281 Muscle weakness (generalized): Secondary | ICD-10-CM | POA: Diagnosis not present

## 2018-06-13 NOTE — Therapy (Signed)
[place], [place], [address] Phone: [phone]   Fax:  [phone]  Physical Therapy Treatment  Patient Details  Name: Lisa Briggs MRN: [id number] Date of Birth: September 29, 1960 Referring Provider (PT): Joni Fears, MD   Encounter Date: 06/13/2018  PT End of Session - 06/13/18 1541    Visit Number  7    Number of Visits  12    Date for PT Re-Evaluation  07/03/18    Authorization Type  BCBS    PT Start Time  1531    Activity Tolerance  Patient tolerated treatment well    Behavior During Therapy  [facility] for tasks assessed/performed       Past Medical History:  Diagnosis Date  . Allergy   . Anemia    iron therapy for years as of 10/12; normal Hgb 08/2013  . Arthritis   . Chest pain 04/05/2011   cardiac eval, normal treadmill stress test, Dr. Tollie Eth  . Chronic back pain   . Constipation   . Farsightedness    wears glasses, Eye care center  . 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 [name], [facility]  . GERD (gastroesophageal reflux disease)   . History of uterine fibroid   . Hyperlipidemia   . Hypertension   . Paresthesia 09/2014   initially thought to be TIA, neurology consult in 12/2014 with other non TIA considerations.    . Polyarthralgia    normal rheumatoid screen 01/2012    Past Surgical History:  Procedure Laterality Date  . COLONOSCOPY  01/2014   diverticulosis, othwerise normal - Dr. Owens Loffler  . ESOPHAGOGASTRODUODENOSCOPY  2013   Dr. Benson Norway, gastritis  . ESOPHAGOGASTRODUODENOSCOPY  [id number]  . EUS N/A 04/16/2014   Procedure: UPPER ENDOSCOPIC ULTRASOUND (EUS) LINEAR;  Surgeon: Milus Banister, MD;  [facility];  Service: Endoscopy;  Laterality: N/A;  . gall stone surgery    . KNEE ARTHROSCOPY Left   . LAPAROSCOPIC GASTRIC RESECTION N/A 06/09/2014   Procedure: LAPAROSCOPIC GASTRIC MASS RESECTION;  Surgeon: [name], MD;   [facility];  Service: General;  Laterality: N/A;  . LIPOMA EXCISION     forehead  . UTERINE FIBROID SURGERY      There were no vitals filed for this visit.  Subjective Assessment - 06/13/18 1622    Subjective  Soreness noted after therapy session yesterday and still with intermittent pain/soreness but no pain pre-tx., symptoms helped with stretches.    Currently in Pain?  No/denies                       [facility] Adult PT Treatment/Exercise - 06/13/18 0001      Elbow Exercises   Elbow Flexion  Strengthening;Left;20 reps    Bar Weights/Barbell (Elbow Flexion)  --   10 lb. KB-started 5 lb. but too light   Elbow Flexion Limitations  increased as noted per pt. request due to 5 lb. too light      Shoulder Exercises: Standing   Row  Strengthening;Left;20 reps    Row Weight (lbs)  10   kettlebell   Other Standing Exercises  lift crates (8 lb. unweighted) with additional 10 lbs. x 5 and 20 lbs. x 10 from floor to 27 in. shelf and from 18" box to 27 in. shelf    Other Standing Exercises  cable "press"/tricep ext 7 lb. 2x10      Shoulder Exercises: ROM/Strengthening   UBE (  Upper Arm Bike)  L2 x 6 min    Cybex Press  20 reps   15 lbs.   Cybex Row  20 reps   25 lbs.     Cryotherapy   Number Minutes Cryotherapy  10 Minutes    Cryotherapy Location  Upper arm   left elbow   Type of Cryotherapy  Ice pack      Ultrasound   Ultrasound Location  elbow   left olecranon region   Ultrasound Parameters  3 MHZ 50% 1.0 W/cm2      Manual Therapy   Joint Mobilization  gentle left elbow distraction grade I-III    Soft tissue mobilization  retrograde STM left elbow region                  PT Long Term Goals - 06/13/18 1621      PT LONG TERM GOAL #1   Title  Independent with HEP    Baseline  met    Time  6    Period  Weeks    Status  Achieved      PT LONG TERM GOAL #2   Title  Improve FOTO outcome measure score to 43% or less impairment    Baseline  43%     Time  6    Period  Weeks    Status  Achieved      PT LONG TERM GOAL #3   Title  Left elbow and shoulder strength grossly 5/5 to improve ability for 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    Baseline  not tested today, improved from baseline at last assessment but not met    Time  6    Period  Weeks    Status  On-going      PT LONG TERM GOAL #4   Title  Increase left grip strength to 20 lbs. or greater to improve ability for gripping and lifting activities for work duties    Baseline  32 lbs. at last assessment    Time  6    Period  Weeks    Status  On-going      PT LONG TERM GOAL #5   Title  Perform ADLs/IADLs with left elbow pain <2/10 at worst    Baseline  no elbow pain today.  not back to work yet,  elbow sore resting on pillow    Time  6    Period  Weeks    Status  On-going              Patient will benefit from skilled therapeutic intervention in order to improve the following deficits and impairments:     Visit Diagnosis: Pain in left elbow  Muscle weakness (generalized)  Localized edema     Problem List Patient Active Problem List   Diagnosis Date Noted  . Left foot pain 04/08/2018  . Olecranon bursitis of left elbow 04/05/2018  . Leg swelling 12/05/2017  . Need for shingles vaccine 12/05/2017  . Primary osteoarthritis of both hands 11/23/2017  . Rheumatoid factor positive 11/23/2017  . Primary osteoarthritis of both knees 11/23/2017  . Primary osteoarthritis of both feet 11/23/2017  . DDD (degenerative disc disease), lumbar 11/23/2017  . Vaccine counseling 08/13/2017  . Polyarthralgia 08/13/2017  . Joint stiffness 08/13/2017  . Sensitive skin 01/23/2017  . Need for influenza vaccination 01/23/2017  . Proteinuria 01/23/2017  . History of gastrointestinal stromal tumor (GIST) 04/20/2016  . History of TIA (  transient ischemic attack) 04/20/2016  . Screening for breast cancer 04/20/2016  . Estrogen deficiency  04/20/2016  . Chronic maxillary sinusitis 04/20/2016  . Impaired fasting blood sugar 04/20/2016  . Constipation 04/20/2016  . History of fall 04/20/2016  . Chronic radicular lumbar pain 01/27/2016  . Encounter for health maintenance examination in adult 03/22/2015  . Paresthesia 03/22/2015  . Cognitive decline 03/22/2015  . Screening for cervical cancer 03/22/2015  . Vitamin D deficiency 03/22/2015  . History of uterine leiomyoma 03/22/2015  . Gastroesophageal reflux disease without esophagitis 03/02/2014  . Chronic nausea 03/02/2014  . Chronic abdominal pain 03/02/2014  . Rhinitis, allergic 03/02/2014  . Essential hypertension 03/02/2014  . Hyperlipidemia 03/02/2014  . Class 1 obesity with serious comorbidity in adult 01/16/2012    Beaulah Dinning, PT, DPT 06/13/18 4:24 PM  [facility] [address] [place], [place], [address] Phone: [phone]   Fax:  [phone]  Name: Lisa Briggs MRN: [id number] Date of Birth: 05-Dec-1960

## 2018-06-17 ENCOUNTER — Ambulatory Visit: Payer: BLUE CROSS/BLUE SHIELD | Admitting: Physical Therapy

## 2018-06-17 ENCOUNTER — Other Ambulatory Visit: Payer: Self-pay

## 2018-06-17 ENCOUNTER — Ambulatory Visit
Admission: RE | Admit: 2018-06-17 | Discharge: 2018-06-17 | Disposition: A | Discharge status: Home / Self Care | Payer: BLUE CROSS/BLUE SHIELD | Source: Ambulatory Visit | Attending: Medical | Admitting: Medical

## 2018-06-17 ENCOUNTER — Encounter: Payer: Self-pay | Admitting: Physical Therapy

## 2018-06-17 DIAGNOSIS — Z1231 Encounter for screening mammogram for malignant neoplasm of breast: Secondary | ICD-10-CM | POA: Diagnosis not present

## 2018-06-17 DIAGNOSIS — R600 Localized edema: Secondary | ICD-10-CM

## 2018-06-17 DIAGNOSIS — M25522 Pain in left elbow: Secondary | ICD-10-CM | POA: Diagnosis not present

## 2018-06-17 DIAGNOSIS — M6281 Muscle weakness (generalized): Secondary | ICD-10-CM

## 2018-06-17 NOTE — Therapy (Signed)
Lisa Briggs, [place], [address] Phone: [phone]   Fax:  [phone]  Physical Therapy Treatment  Patient Details  Name: Lisa Briggs MRN: [id number] Date of Birth: [birth year] Referring Provider (PT): Joni Fears, MD   Encounter Date: 06/17/2018  PT End of Session - 06/17/18 1554    Visit Number  8    Number of Visits  12    Date for PT Re-Evaluation  07/03/18    Authorization Type  [employer]    PT Start Time  1545    PT Stop Time  1627    PT Time Calculation (min)  42 min    Activity Tolerance  Patient tolerated treatment well    Behavior During Therapy  [facility] for tasks assessed/performed       Past Medical History:  Diagnosis Date  . Allergy   . Anemia    iron therapy for years as of 10/12; normal Hgb 08/2013  . Arthritis   . Chest pain 04/05/2011   cardiac eval, normal treadmill stress test, Dr. Tollie Eth  . Chronic back pain   . Constipation   . Farsightedness    wears glasses, [facility] care center  . 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 [name], [facility]  . GERD (gastroesophageal reflux disease)   . History of uterine fibroid   . Hyperlipidemia   . Hypertension   . Paresthesia 09/2014   initially thought to be TIA, neurology consult in 12/2014 with other non TIA considerations.    . Polyarthralgia    normal rheumatoid screen 01/2012    Past Surgical History:  Procedure Laterality Date  . COLONOSCOPY  01/2014   diverticulosis, othwerise normal - Dr. Owens Loffler  . ESOPHAGOGASTRODUODENOSCOPY  2013   Dr. Benson Norway, gastritis  . ESOPHAGOGASTRODUODENOSCOPY  [id number]  . EUS N/A 04/16/2014   Procedure: UPPER ENDOSCOPIC ULTRASOUND (EUS) LINEAR;  Surgeon: Milus Banister, MD;  Location: [facility];  Service: Endoscopy;  Laterality: N/A;  . gall stone surgery    . KNEE ARTHROSCOPY Left   . LAPAROSCOPIC GASTRIC RESECTION N/A 06/09/2014   Procedure:  LAPAROSCOPIC GASTRIC MASS RESECTION;  Surgeon: [name], MD;  Location: [facility];  Service: General;  Laterality: N/A;  . LIPOMA EXCISION     forehead  . UTERINE FIBROID SURGERY      There were no vitals filed for this visit.  Subjective Assessment - 06/17/18 1548    Subjective  Pt. sees Dr. Durward Fortes tomorrow for follow up. No elbow pain pre-tx.    Currently in Pain?  No/denies         [facility] PT Assessment - 06/17/18 0001      Observation/Other Assessments   Focus on Therapeutic Outcomes (FOTO)   43% limited   assessed 06/13/18     Strength   Overall Strength Comments  left grip 42 lbs.    Left Shoulder Flexion  4+/5    Left Shoulder ABduction  4+/5    Left Shoulder Internal Rotation  5/5    Left Shoulder External Rotation  5/5    Left Elbow Flexion  5/5    Left Elbow Extension  4+/5                   [facility] Adult PT Treatment/Exercise - 06/17/18 0001      Elbow Exercises   Elbow Flexion  Strengthening;Left;20 reps    Bar Weights/Barbell (Elbow Flexion)  --   10 lbs. 1st  set, 5 lb. 2nd set   Elbow Extension  Strengthening;Left;20 reps    Theraband Level (Elbow Extension)  Level 3 (Green)      Shoulder Exercises: Standing   Flexion  Strengthening;Left;20 reps    Shoulder Flexion Weight (lbs)  3    ABduction  --   [place] abd red band 2x10   Row  Strengthening;Left;20 reps    Row Weight (lbs)  10   KB   Other Standing Exercises  lift crate from stool x 10 reps, 28 lb.s total with crate 8 lb. unweighted, 20 lbs. added      Shoulder Exercises: ROM/Strengthening   UBE (Upper Arm Bike)  L2 x 5 min (2.5 min ea. fw/rev)    Cybex Press  20 reps   15 lbs.   Cybex Row  20 reps   25 lbs.   Other ROM/Strengthening Exercises  unilat. cable press 2x10 with 7 lbs. left side only      Ultrasound   Ultrasound Location  elbow   olecranon   Ultrasound Parameters  3 MHZ 50% 1.0 W/cm2      Manual Therapy   Soft tissue mobilization  gentle retrograde STM left  elbow region for edema                  PT Long Term Goals - 06/17/18 1558      PT LONG TERM GOAL #1   Title  Independent with HEP    Baseline  met    Time  6    Period  Weeks    Status  Achieved      PT LONG TERM GOAL #2   Title  Improve FOTO outcome measure score to 43% or less impairment    Baseline  43%    Time  6    Period  Weeks    Status  Achieved      PT LONG TERM GOAL #3   Title  Left elbow and shoulder strength grossly 5/5 to improve ability for 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    Baseline  see flowsheet, improved but not met    Time  6    Period  Weeks    Status  On-going      PT LONG TERM GOAL #4   Title  Increase left grip strength to 20 lbs. or greater to improve ability for gripping and lifting activities for work duties    Baseline  42 lb.    Time  6    Period  Weeks    Status  On-going      PT LONG TERM GOAL #5   Title  Perform ADLs/IADLs with left elbow pain <2/10 at worst    Baseline  no pain with household ADLs but has not challenged with more involved activities or been back to work    Time  6    Period  Weeks    Status  On-going            Plan - 06/17/18 1628    Clinical Impression Statement  Pt. continues to gradually improve from previous status with left elbow/grip and shoulder strength gains (see flowsheet). Still some weakness vs. right side but functional status continues to improve.,    Stability/Clinical Decision Making  Stable/Uncomplicated    Clinical Decision Making  Low    Rehab Potential  Good    PT Frequency  2x / week    PT Duration  6 weeks    PT Treatment/Interventions  Electrical Stimulation;ADLs/Self Care Home Management;Cryotherapy;Ultrasound;Moist Heat;Iontophoresis 53m/ml Dexamethasone;Therapeutic activities;Patient/family education;Therapeutic exercise;Neuromuscular re-education;Manual techniques;Dry needling;Taping;Vasopneumatic Device    PT Next Visit Plan  Progress  elbow/shoulder ROM and strengthening as tolerated pending pain, continue modalities, manual tx. prn to address edema, pain.      PT Home Exercise Plan  Theraband elbow ext and flex, Theraband row and "punch" Theraband ER, IR    Consulted and Agree with Plan of Care  Patient       Patient will benefit from skilled therapeutic intervention in order to improve the following deficits and impairments:  Pain, Increased edema, Impaired UE functional use, Decreased range of motion, Decreased strength  Visit Diagnosis: Pain in left elbow  Muscle weakness (generalized)  Localized edema     Problem List Patient Active Problem List   Diagnosis Date Noted  . Left foot pain 04/08/2018  . Olecranon bursitis of left elbow 04/05/2018  . Leg swelling 12/05/2017  . Need for shingles vaccine 12/05/2017  . Primary osteoarthritis of both hands 11/23/2017  . Rheumatoid factor positive 11/23/2017  . Primary osteoarthritis of both knees 11/23/2017  . Primary osteoarthritis of both feet 11/23/2017  . DDD (degenerative disc disease), lumbar 11/23/2017  . Vaccine counseling 08/13/2017  . Polyarthralgia 08/13/2017  . Joint stiffness 08/13/2017  . Sensitive skin 01/23/2017  . Need for influenza vaccination 01/23/2017  . Proteinuria 01/23/2017  . History of gastrointestinal stromal tumor (GIST) 04/20/2016  . History of TIA (transient ischemic attack) 04/20/2016  . Screening for breast cancer 04/20/2016  . Estrogen deficiency 04/20/2016  . Chronic maxillary sinusitis 04/20/2016  . Impaired fasting blood sugar 04/20/2016  . Constipation 04/20/2016  . History of fall 04/20/2016  . Chronic radicular lumbar pain 01/27/2016  . Encounter for health maintenance examination in adult 03/22/2015  . Paresthesia 03/22/2015  . Cognitive decline 03/22/2015  . Screening for cervical cancer 03/22/2015  . Vitamin D deficiency 03/22/2015  . History of uterine leiomyoma 03/22/2015  . Gastroesophageal reflux disease  without esophagitis 03/02/2014  . Chronic nausea 03/02/2014  . Chronic abdominal pain 03/02/2014  . Rhinitis, allergic 03/02/2014  . Essential hypertension 03/02/2014  . Hyperlipidemia 03/02/2014  . Class 1 obesity with serious comorbidity in adult 01/16/2012    CBeaulah Dinning PT, DPT 06/17/18 4:32 PM  [facility] [address]Phone: [phone]  Fax:  [phone] Name: YRavenne WaymentMRN: [id number] of Birth: [birth year]

## 2018-06-18 ENCOUNTER — Ambulatory Visit (INDEPENDENT_AMBULATORY_CARE_PROVIDER_SITE_OTHER): Payer: BLUE CROSS/BLUE SHIELD | Admitting: Orthopaedic Surgery

## 2018-06-18 ENCOUNTER — Ambulatory Visit: Payer: BLUE CROSS/BLUE SHIELD | Admitting: Physical Therapy

## 2018-06-18 ENCOUNTER — Encounter (INDEPENDENT_AMBULATORY_CARE_PROVIDER_SITE_OTHER): Payer: Self-pay | Admitting: Orthopaedic Surgery

## 2018-06-18 ENCOUNTER — Telehealth (INDEPENDENT_AMBULATORY_CARE_PROVIDER_SITE_OTHER): Payer: Self-pay | Admitting: Orthopaedic Surgery

## 2018-06-18 VITALS — BP 110/73 | HR 93 | Ht 69.0 in | Wt 215.0 lb

## 2018-06-18 DIAGNOSIS — S93492D Sprain of other ligament of left ankle, subsequent encounter: Secondary | ICD-10-CM

## 2018-06-18 DIAGNOSIS — M7022 Olecranon bursitis, left elbow: Secondary | ICD-10-CM | POA: Diagnosis not present

## 2018-06-18 NOTE — Telephone Encounter (Signed)
Faxed return to work note [employer] [phone] [employer] [phone] [employer] Fax [place] Fax [phone]

## 2018-06-18 NOTE — Progress Notes (Signed)
Office Visit Note   [name]           Date of Birth: 1960/05/30           MRN: [id number] Visit Date: 06/18/2018              Requested by: Carlena Hurl, PA-C [name] [place], [place] [address] PCP: Carlena Hurl, PA-C   Assessment & Plan: Visit Diagnoses:  1. Olecranon bursitis of left elbow   2. Sprain of anterior talofibular ligament of left ankle, subsequent encounter     Plan:  #1: Return to work next Monday. #2: Gave her a set of orthotics to use in her shoes and at work. #3: Continue exercises for her elbow and ankle. #4: Follow back up as needed  Follow-Up Instructions: No follow-ups on file.   Orders:  No orders of the defined types were placed in this encounter.  No orders of the defined types were placed in this encounter.     Procedures: No procedures performed   Clinical Data: No additional findings.   Subjective: Chief Complaint  Patient presents with  . Left Elbow - Follow-up  . Left Ankle - Follow-up  Patient presents today for a three week follow up on her left ankle sprain and left elbow bursitis.  Patient states that her ankle is doing better. She has some swelling in her elbow. She has continued to do therapy. She is hoping to go back to work next week.   HPI  Lisa Briggs is seen today for follow-up evaluation of her left ankle sprain as well as her left elbow olecranon bursitis.  He is improving both areas.  She is ambulating with a ASO ankle brace.  She is doing physical therapy also for strengthening.  Still states she has weakness diffusely.  Review of Systems  Constitutional: Negative for fatigue.  HENT: Positive for ear pain.   Eyes: Negative for pain.  Respiratory: Positive for shortness of breath.   Cardiovascular: Positive for leg swelling.  Gastrointestinal: Positive for constipation. Negative for diarrhea.  Endocrine: Positive for heat intolerance. Negative for cold intolerance.  Genitourinary:  Negative for difficulty urinating.  Musculoskeletal: Negative for joint swelling.  Skin: Negative for rash.  Allergic/Immunologic: Positive for food allergies.  Neurological: Negative for weakness.  Hematological: Does not bruise/bleed easily.  Psychiatric/Behavioral: Negative for sleep disturbance.     Objective: Vital Signs: BP 110/73   Pulse 93   Ht 5\' 9"  (1.753 m)   Wt 215 lb (97.5 kg)   LMP 02/23/2015   BMI 31.75 kg/m   Physical Exam Constitutional:      Appearance: She is well-developed.  Eyes:     Pupils: Pupils are equal, round, and reactive to light.  Pulmonary:     Effort: Pulmonary effort is normal.  Skin:    General: Skin is warm and dry.  Neurological:     Mental Status: She is alert and oriented to person, place, and time.  Psychiatric:        Behavior: Behavior normal.     Ortho Exam  Left elbow today reveals full range of motion.  The olecranon bursa is flat.  No warmth no erythema.  Good strength.  Left ankle reveals minimal ATF and CF laxity.  No swelling noted.  Vastly intact distally.  Calf is supple nontender.  Specialty Comments:  No specialty comments available.  Imaging: Mm 3d Screen Breast Bilateral  Result Date: 06/18/2018 CLINICAL DATA:  Screening. EXAM: DIGITAL SCREENING  BILATERAL MAMMOGRAM WITH TOMO AND CAD COMPARISON:  Previous exam(s). ACR Breast Density Category b: There are scattered areas of fibroglandular density. FINDINGS: There are no findings suspicious for malignancy. Images were processed with CAD. IMPRESSION: No mammographic evidence of malignancy. A result letter of this screening mammogram will be mailed directly to the patient. RECOMMENDATION: Screening mammogram in one year. (Code:SM-B-01Y) BI-RADS CATEGORY  1: Negative. Electronically Signed   By: Nolon Nations M.D.   On: 06/18/2018 13:24     PMFS History: Current Outpatient Medications  Medication Sig Dispense Refill  . aspirin EC 81 MG tablet Take 1 tablet (81 mg  total) by mouth daily. (Patient taking differently: Take 325 mg by mouth daily. ) 90 tablet 3  . cholecalciferol (VITAMIN D) 1000 units tablet Take 1 tablet (1,000 Units total) by mouth daily. 90 tablet 3  . cyclobenzaprine (FLEXERIL) 10 MG tablet Take 1 tablet (10 mg total) by mouth 2 (two) times daily as needed for muscle spasms. 20 tablet 2  . dexlansoprazole (DEXILANT) 60 MG capsule Take 1 capsule (60 mg total) by mouth daily. 90 capsule 3  . doxycycline (VIBRAMYCIN) 100 MG capsule Take 1 capsule (100 mg total) by mouth 2 (two) times daily. 14 capsule 0  . EQ ALLERGY RELIEF 10 MG tablet TAKE ONE TABLET BY MOUTH ONCE DAILY 30 tablet 11  . famotidine (PEPCID) 20 MG tablet Take 1 tablet (20 mg total) by mouth at bedtime. 90 tablet 3  . fluticasone (FLONASE) 50 MCG/ACT nasal spray USE ONE SPRAY(S) IN EACH NOSTRIL DAILY AS NEEDED FOR ALLERGIES OR RHINITIS. 16 g 11  . gabapentin (NEURONTIN) 300 MG capsule TAKE ONE TABLET BY MOUTH IN THE AFTERNOON AND 2 AT BEDTIME 270 capsule 3  . hydrocortisone (PROCTOCORT) 1 % CREA Apply 1 application topically daily. 45 g 1  . hydroxychloroquine (PLAQUENIL) 200 MG tablet TAKE 1 TABLET BY MOUTH TWICE A DAY 60 tablet 1  . losartan-hydrochlorothiazide (HYZAAR) 50-12.5 MG tablet Take 1 tablet by mouth daily. 90 tablet 3  . meloxicam (MOBIC) 7.5 MG tablet Take 1 tablet (7.5 mg total) by mouth daily. 30 tablet 0  . ondansetron (ZOFRAN) 4 MG tablet TAKE ONE TABLET BY MOUTH EVERY 8 HOURS AS NEEDED FOR  NAUSEA  OR  VOMITING 20 tablet 2  . polyethylene glycol powder (GLYCOLAX/MIRALAX) powder TAKE ONE CAPFUL (17 GRAMS) BY MOUTH DAILY. 850 g 3  . potassium chloride (K-DUR) 10 MEQ tablet     . potassium chloride (K-DUR,KLOR-CON) 10 MEQ tablet Take 1 tablet (10 mEq total) by mouth 2 (two) times daily. 180 tablet 3  . simvastatin (ZOCOR) 20 MG tablet Take 1 tablet (20 mg total) by mouth daily. 90 tablet 3  . venlafaxine (EFFEXOR) 37.5 MG tablet      No current  facility-administered medications for this visit.     Patient Active Problem List   Diagnosis Date Noted  . Left foot pain 04/08/2018  . Olecranon bursitis of left elbow 04/05/2018  . Leg swelling 12/05/2017  . Need for shingles vaccine 12/05/2017  . Primary osteoarthritis of both hands 11/23/2017  . Rheumatoid factor positive 11/23/2017  . Primary osteoarthritis of both knees 11/23/2017  . Primary osteoarthritis of both feet 11/23/2017  . DDD (degenerative disc disease), lumbar 11/23/2017  . Vaccine counseling 08/13/2017  . Polyarthralgia 08/13/2017  . Joint stiffness 08/13/2017  . Sensitive skin 01/23/2017  . Need for influenza vaccination 01/23/2017  . Proteinuria 01/23/2017  . History of gastrointestinal stromal tumor (GIST) 04/20/2016  .  History of TIA (transient ischemic attack) 04/20/2016  . Screening for breast cancer 04/20/2016  . Estrogen deficiency 04/20/2016  . Chronic maxillary sinusitis 04/20/2016  . Impaired fasting blood sugar 04/20/2016  . Constipation 04/20/2016  . History of fall 04/20/2016  . Chronic radicular lumbar pain 01/27/2016  . Encounter for health maintenance examination in adult 03/22/2015  . Paresthesia 03/22/2015  . Cognitive decline 03/22/2015  . Screening for cervical cancer 03/22/2015  . Vitamin D deficiency 03/22/2015  . History of uterine leiomyoma 03/22/2015  . Gastroesophageal reflux disease without esophagitis 03/02/2014  . Chronic nausea 03/02/2014  . Chronic abdominal pain 03/02/2014  . Rhinitis, allergic 03/02/2014  . Essential hypertension 03/02/2014  . Hyperlipidemia 03/02/2014  . Class 1 obesity with serious comorbidity in adult 01/16/2012   Past Medical History:  Diagnosis Date  . Allergy   . Anemia    iron therapy for years as of 10/12; normal Hgb 08/2013  . Arthritis   . Chest pain 04/05/2011   cardiac eval, normal treadmill stress test, Dr. Tollie Eth  . Chronic back pain   . Constipation   . Farsightedness     wears glasses, Eye care center  . 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 [name], [facility]  . GERD (gastroesophageal reflux disease)   . History of uterine fibroid   . Hyperlipidemia   . Hypertension   . Paresthesia 09/2014   initially thought to be TIA, neurology consult in 12/2014 with other non TIA considerations.    . Polyarthralgia    normal rheumatoid screen 01/2012    Family History  Problem Relation Age of Onset  . Hypertension Mother   . Arthritis Mother   . GER disease Mother   . Glaucoma Mother   . Breast cancer Mother   . Stroke Father   . Breast cancer Sister        breast cancer dx late 27s  . Hypertension Sister   . Colon cancer Brother 98  . Diabetes Brother   . Diabetes Maternal Aunt   . Heart disease Maternal Aunt   . Heart disease Maternal Grandmother   . Heart disease Maternal Grandfather   . Heart disease Maternal Uncle   . Stroke Maternal Uncle   . Leukemia Sister   . Hypertension Sister   . Healthy Son   . Colon polyps Neg Hx     Past Surgical History:  Procedure Laterality Date  . COLONOSCOPY  01/2014   diverticulosis, othwerise normal - Dr. Owens Loffler  . ESOPHAGOGASTRODUODENOSCOPY  2013   Dr. Benson Norway, gastritis  . ESOPHAGOGASTRODUODENOSCOPY  [id number]  . EUS N/A 04/16/2014   Procedure: UPPER ENDOSCOPIC ULTRASOUND (EUS) LINEAR;  Surgeon: Milus Banister, MD;  [facility];  Service: Endoscopy;  Laterality: N/A;  . gall stone surgery    . KNEE ARTHROSCOPY Left   . LAPAROSCOPIC GASTRIC RESECTION N/A 06/09/2014   Procedure: LAPAROSCOPIC GASTRIC MASS RESECTION;  Surgeon: [name], MD;  Location: [facility];  Service: General;  Laterality: N/A;  . LIPOMA EXCISION     forehead  . UTERINE FIBROID SURGERY     Social History   Occupational History  . Occupation: IT sales professional: [employer]  Tobacco Use  . Smoking status: Never Smoker  . Smokeless tobacco: Never Used  Substance and  Sexual Activity  . Alcohol use: No    Alcohol/week: 0.0 standard drinks  . Drug use: No  . Sexual activity: Not on file

## 2018-06-20 ENCOUNTER — Ambulatory Visit: Payer: BLUE CROSS/BLUE SHIELD | Admitting: Physical Therapy

## 2018-06-24 ENCOUNTER — Other Ambulatory Visit: Payer: Self-pay | Admitting: Medical

## 2018-06-24 NOTE — Telephone Encounter (Signed)
Is this ok to refill?  

## 2018-06-25 ENCOUNTER — Ambulatory Visit: Payer: BLUE CROSS/BLUE SHIELD | Admitting: Physical Therapy

## 2018-06-27 ENCOUNTER — Ambulatory Visit: Payer: BLUE CROSS/BLUE SHIELD | Admitting: Physical Therapy

## 2018-07-02 ENCOUNTER — Ambulatory Visit: Payer: BLUE CROSS/BLUE SHIELD | Admitting: Physical Therapy

## 2018-07-04 ENCOUNTER — Ambulatory Visit: Payer: BLUE CROSS/BLUE SHIELD | Admitting: Physical Therapy

## 2018-07-06 ENCOUNTER — Other Ambulatory Visit: Payer: Self-pay | Admitting: Medical

## 2018-07-08 ENCOUNTER — Other Ambulatory Visit: Payer: Self-pay

## 2018-07-08 ENCOUNTER — Ambulatory Visit (INDEPENDENT_AMBULATORY_CARE_PROVIDER_SITE_OTHER): Payer: BLUE CROSS/BLUE SHIELD | Admitting: Medical

## 2018-07-08 ENCOUNTER — Telehealth: Payer: Self-pay | Admitting: Medical

## 2018-07-08 VITALS — Ht 69.0 in | Wt 215.0 lb

## 2018-07-08 DIAGNOSIS — I10 Essential (primary) hypertension: Secondary | ICD-10-CM

## 2018-07-08 DIAGNOSIS — G8929 Other chronic pain: Secondary | ICD-10-CM | POA: Insufficient documentation

## 2018-07-08 DIAGNOSIS — R768 Other specified abnormal immunological findings in serum: Secondary | ICD-10-CM

## 2018-07-08 DIAGNOSIS — K2950 Unspecified chronic gastritis without bleeding: Secondary | ICD-10-CM

## 2018-07-08 DIAGNOSIS — M25562 Pain in left knee: Secondary | ICD-10-CM

## 2018-07-08 DIAGNOSIS — M25559 Pain in unspecified hip: Secondary | ICD-10-CM

## 2018-07-08 DIAGNOSIS — K5900 Constipation, unspecified: Secondary | ICD-10-CM | POA: Diagnosis not present

## 2018-07-08 DIAGNOSIS — R609 Edema, unspecified: Secondary | ICD-10-CM

## 2018-07-08 DIAGNOSIS — M25462 Effusion, left knee: Secondary | ICD-10-CM

## 2018-07-08 MED ORDER — ASPIRIN EC 81 MG PO TBEC: 81.0000 mg | DELAYED_RELEASE_TABLET | Freq: Every day | ORAL | 3 refills | Status: DC

## 2018-07-08 MED ORDER — SIMVASTATIN 20 MG PO TABS: 20.0000 mg | ORAL_TABLET | Freq: Every day | ORAL | 0 refills | Status: DC

## 2018-07-08 MED ORDER — DEXLANSOPRAZOLE 60 MG PO CPDR: 1.0000 | DELAYED_RELEASE_CAPSULE | Freq: Every day | ORAL | 0 refills | Status: DC

## 2018-07-08 MED ORDER — HYDROCHLOROTHIAZIDE 12.5 MG PO TABS: 12.5000 mg | ORAL_TABLET | Freq: Every day | ORAL | 0 refills | Status: DC

## 2018-07-08 MED ORDER — VITAMIN D 25 MCG (1000 UNIT) PO TABS: 1000.0000 [IU] | ORAL_TABLET | Freq: Every day | ORAL | 3 refills | Status: DC

## 2018-07-08 MED ORDER — FAMOTIDINE 20 MG PO TABS: 20.0000 mg | ORAL_TABLET | Freq: Every day | ORAL | 0 refills | Status: DC

## 2018-07-08 MED ORDER — LINACLOTIDE 72 MCG PO CAPS: 72.0000 ug | ORAL_CAPSULE | Freq: Every day | ORAL | 0 refills | Status: DC

## 2018-07-08 MED ORDER — LOSARTAN POTASSIUM-HCTZ 50-12.5 MG PO TABS: 1.0000 | ORAL_TABLET | Freq: Every day | ORAL | 0 refills | Status: DC

## 2018-07-08 MED ORDER — MELOXICAM 7.5 MG PO TABS: 7.5000 mg | ORAL_TABLET | Freq: Every day | ORAL | 0 refills | Status: DC

## 2018-07-08 MED ORDER — GABAPENTIN 300 MG PO CAPS: ORAL_CAPSULE | ORAL | 0 refills | Status: DC

## 2018-07-08 NOTE — Progress Notes (Signed)
Subjective:     Patient ID: Lisa Briggs, female   DOB: [birth year], 58 y.o.   MRN: [id number]  Interactive audio and video telecommunications were attempted between this provider and patient, however due to patient not being able to gain access to video capability, we continued and completed visit with audio only.  The patient was located at home. The provider was located in the office. The patient did consent to this visit and is aware of possible charges through their insurance for this visit.  The other persons participating in this telemedicine service were none. Time spent on call was 20 minutes and in review of previous records >30 minutes total.  This virtual service is not related to other E/M service within previous 7 days.   HPI Chief Complaint  Patient presents with  . med check    med refills, leg swelling, hip swollen    Virtual visit today for multiple concerns.  She reports having some problems with constipation still.  She would like me to send her a colon cleanse to her pharmacy.  She is taking MiraLAX 1 capful daily but does not feel like that is helping.  She has done colon cleanse in the past but would resume call when out.  She says she is tried milk of magnesia in the past but this does not work for her and not willing to try it again.  She also notes in the past using MiraLAX multiple times a day without relief.  She wants a tablet instead of a liquid to try.  She does see gastroenterology, is up-to-date on colonoscopy per her report  She reports needing refills on several medications.  She notes that her insurance will let her get an early advance on 90-day supply given the coronavirus stat home measures.  She needs refills on the ones listed below and a refilled today  She reports intermittent pain in both hips, has left knee pain.  Over the weekend the left knee has been hurting more, she notes some swelling in the left knee.  She can bend the knee but it hurts.   She has been using ice, elevation, rubbing the knee down with ointment over-the-counter.  She denies any fall or injury or trauma.  She has been doing some daily around the house things with no strenuous activity.  She sees Dr. Estanislado Pandy for rheumatology but could not get through their office.  She has seen Dr. Durward Fortes in the past with orthopedics for elbow but has not talked to him about her knee.  She is out of her meloxicam.  She is adamant about trying an additional fluid pill and says she has been on extra fluid pills in the past when she has a little more swelling in her legs.  She notes some swelling in her legs currently but no chest pain no significant weight changes, no shortness of breath.  Past Medical History:  Diagnosis Date  . Allergy   . Anemia    iron therapy for years as of 10/12; normal Hgb 08/2013  . Arthritis   . Chest pain 04/05/2011   cardiac eval, normal treadmill stress test, Dr. Tollie Eth  . Chronic back pain   . Constipation   . Farsightedness    wears glasses, [facility]  . 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 [name], [facility]  . GERD (gastroesophageal reflux disease)   . History of uterine fibroid   . Hyperlipidemia   .  Hypertension   . Paresthesia 09/2014   initially thought to be TIA, neurology consult in 12/2014 with other non TIA considerations.    . Polyarthralgia    normal rheumatoid screen 01/2012   Current Outpatient Medications on File Prior to Visit  Medication Sig Dispense Refill  . cyclobenzaprine (FLEXERIL) 10 MG tablet Take 1 tablet (10 mg total) by mouth 2 (two) times daily as needed for muscle spasms. 20 tablet 2  . fluticasone (FLONASE) 50 MCG/ACT nasal spray USE ONE SPRAY(S) IN EACH NOSTRIL DAILY AS NEEDED FOR ALLERGIES OR RHINITIS. 16 g 11  . hydrocortisone (PROCTOCORT) 1 % CREA Apply 1 application topically daily. 45 g 1  . hydroxychloroquine (PLAQUENIL) 200 MG tablet TAKE 1 TABLET BY  MOUTH TWICE A DAY 60 tablet 1  . loratadine (CLARITIN) 10 MG tablet TAKE ONE TABLET BY MOUTH ONCE DAILY 90 tablet 3  . ondansetron (ZOFRAN) 4 MG tablet TAKE ONE TABLET BY MOUTH EVERY 8 HOURS AS NEEDED FOR  NAUSEA  OR  VOMITING 20 tablet 2  . potassium chloride (K-DUR,KLOR-CON) 10 MEQ tablet Take 1 tablet (10 mEq total) by mouth 2 (two) times daily. 180 tablet 3  . venlafaxine (EFFEXOR) 37.5 MG tablet      No current facility-administered medications on file prior to visit.      Review of Systems As in subjective     Objective:   Physical Exam Due to coronavirus pandemic stay at home measures, patient visit was virtual and they were not examined in person.   Gen: nad, answers questions approprietly      Assessment:     Encounter Diagnoses  Name Primary?  . Constipation, unspecified constipation type Yes  . Chronic pain of left knee   . Chronic gastritis without bleeding, unspecified gastritis type   . Essential hypertension   . Rheumatoid factor positive   . Pain and swelling of left knee   . Chronic hip pain, unspecified laterality   . Edema, unspecified type        Plan:     Left knee pain and swelling, bilateral hip pain, chronic, sees rheumatology and orthopedics but unable to get a hold of them today.  I refilled her meloxicam, advise she continue ice elevation next few days.  I advised if not improved within the next 2 to 3 days or if much worse in the meantime then she needs to be evaluated face-to-face with orthopedics.  We discussed signs of infected joint blood clot and other more serious etiologies that cannot be diagnosed or evaluated over the phone.  She reports that her symptoms are her typical chronic joint pains are little worse at the time being.  Edema-it is unclear on the phone whether her edema is isolated to the left knee joint versus in general.  She is not the best historian.  She is adamant about using an extra fluid pill.  I reluctantly will give her an  additional 12.5 mg of hydrochlorothiazide short-term for the next few days to see if this helps.  We discussed symptoms that would prompt eval at the emergency department or prompt a call back such as weight gain, shortness of breath, pitting edema, worse fatigue.  She does not have any of those currently.  I advise she call back within the next 2 to 4 days  Hypertension-continue same medication  Refilled several chronic medications below at her request to get 90-day supply early given the coronavirus that home measures  Constipation- she declines to use  milk of magnesia or hourly MiraLAX dosing for the next few hours.  I advise she continue good hydration, begin trial of Linzess since MiraLAX daily is not helping.  I reviewed her 73 colonoscopy report  Joanie was seen today for med check.  Diagnoses and all orders for this visit:  Constipation, unspecified constipation type  Chronic pain of left knee  Chronic gastritis without bleeding, unspecified gastritis type -     dexlansoprazole (DEXILANT) 60 MG capsule; Take 1 capsule (60 mg total) by mouth daily.  Essential hypertension  Rheumatoid factor positive  Pain and swelling of left knee  Chronic hip pain, unspecified laterality  Edema, unspecified type  Other orders -     simvastatin (ZOCOR) 20 MG tablet; Take 1 tablet (20 mg total) by mouth daily. -     meloxicam (MOBIC) 7.5 MG tablet; Take 1 tablet (7.5 mg total) by mouth daily. -     losartan-hydrochlorothiazide (HYZAAR) 50-12.5 MG tablet; Take 1 tablet by mouth daily. -     gabapentin (NEURONTIN) 300 MG capsule; TAKE ONE TABLET BY MOUTH IN THE AFTERNOON AND 2 AT BEDTIME -     aspirin EC 81 MG tablet; Take 1 tablet (81 mg total) by mouth daily. -     cholecalciferol (VITAMIN D3) 25 MCG (1000 UT) tablet; Take 1 tablet (1,000 Units total) by mouth daily. -     famotidine (PEPCID) 20 MG tablet; Take 1 tablet (20 mg total) by mouth at bedtime. -     linaclotide (LINZESS) 72 MCG  capsule; Take 1 capsule (72 mcg total) by mouth daily before breakfast. -     hydrochlorothiazide (HYDRODIURIL) 12.5 MG tablet; Take 1 tablet (12.5 mg total) by mouth daily.

## 2018-07-08 NOTE — Telephone Encounter (Signed)
Please call her Wednesday 07/10/18 to check on her swelling, her constipation, and get an idea on how improved she is?  If her joint pain and swelling is not any improved and she needs to call the orthopedic office  Regarding constipation, if improving, I want to do a recheck in 1 month  Regarding the extra fluid pill I sent out on Monday, allow her to stop this after 1 week

## 2018-07-08 NOTE — Addendum Note (Signed)
Addended by: Carlena Hurl on: 07/08/2018 04:40 PM   Modules accepted: Level of Service

## 2018-07-08 NOTE — Telephone Encounter (Signed)
Is this ok to refill?  

## 2018-07-09 ENCOUNTER — Telehealth: Payer: Self-pay | Admitting: Physical Therapy

## 2018-07-09 ENCOUNTER — Other Ambulatory Visit: Payer: Self-pay | Admitting: Medical

## 2018-07-09 ENCOUNTER — Telehealth: Payer: Self-pay

## 2018-07-09 DIAGNOSIS — R110 Nausea: Secondary | ICD-10-CM

## 2018-07-09 MED ORDER — ONDANSETRON HCL 4 MG PO TABS: ORAL_TABLET | ORAL | 0 refills | Status: DC

## 2018-07-09 NOTE — Telephone Encounter (Signed)
Patient states that she wants a 90 day supply of zofran

## 2018-07-09 NOTE — Telephone Encounter (Signed)
Left message on voice mail  to call back

## 2018-07-09 NOTE — Telephone Encounter (Signed)
Lisa Briggs was contacted today regarding the temporary reduction of OP Rehab Services due to concerns for community transmission of Covid-19.    Therapist advised the patient to continue to perform their HEP and assured they had no unanswered questions at this time.  The patient was offered and declined the continuation in their POC by using methods such as an e-visit, virtual check in, or telehealth visit.    Outpatient Rehabilitation Services will follow up with this client when we are able to safely resume care at the [facility] in person.   Patient is aware we can be reached by telephone during limited business hours in the meantime.

## 2018-07-11 NOTE — Telephone Encounter (Signed)
Pt states she is feeling better and doing well. She said if the fluid pill is working for her, she wants to continue taking this.

## 2018-07-15 ENCOUNTER — Other Ambulatory Visit: Payer: Self-pay | Admitting: Medical

## 2018-07-15 ENCOUNTER — Other Ambulatory Visit: Payer: Self-pay | Admitting: *Deleted

## 2018-07-15 MED ORDER — HYDROCHLOROTHIAZIDE 12.5 MG PO TABS: 12.5000 mg | ORAL_TABLET | Freq: Every day | ORAL | 2 refills | Status: DC

## 2018-07-15 MED ORDER — SIMVASTATIN 20 MG PO TABS: 20.0000 mg | ORAL_TABLET | Freq: Every day | ORAL | 0 refills | Status: DC

## 2018-07-15 MED ORDER — HYDROCHLOROTHIAZIDE 12.5 MG PO TABS: 12.5000 mg | ORAL_TABLET | Freq: Every day | ORAL | 0 refills | Status: DC

## 2018-07-15 NOTE — Telephone Encounter (Signed)
Done

## 2018-07-15 NOTE — Telephone Encounter (Signed)
Sent refills on the separate HCTZ.   Lets do a followup and labs in 4-6 weeks

## 2018-07-28 ENCOUNTER — Other Ambulatory Visit: Payer: Self-pay | Admitting: Rheumatology

## 2018-07-29 NOTE — Telephone Encounter (Signed)
Last Visit: 04/05/2018 Next Visit: 09/04/2018 Labs: 04/05/2018 WBC count is elevated. Glucose is elevated-156.  Eye exam: 12/18/2017   Okay to refill per Dr. Estanislado Pandy.

## 2018-08-01 ENCOUNTER — Encounter (INDEPENDENT_AMBULATORY_CARE_PROVIDER_SITE_OTHER): Payer: Self-pay | Admitting: Orthopaedic Surgery

## 2018-08-01 ENCOUNTER — Other Ambulatory Visit (INDEPENDENT_AMBULATORY_CARE_PROVIDER_SITE_OTHER): Payer: Self-pay | Admitting: Orthopedic Surgery

## 2018-08-01 ENCOUNTER — Ambulatory Visit (INDEPENDENT_AMBULATORY_CARE_PROVIDER_SITE_OTHER): Payer: BLUE CROSS/BLUE SHIELD | Admitting: Orthopaedic Surgery

## 2018-08-01 ENCOUNTER — Other Ambulatory Visit: Payer: Self-pay

## 2018-08-01 VITALS — BP 110/85 | HR 87 | Ht 69.0 in | Wt 215.0 lb

## 2018-08-01 DIAGNOSIS — G8929 Other chronic pain: Secondary | ICD-10-CM | POA: Diagnosis not present

## 2018-08-01 DIAGNOSIS — M25562 Pain in left knee: Secondary | ICD-10-CM

## 2018-08-01 MED ORDER — DICLOFENAC SODIUM 1 % TD GEL: 2.0000 g | Freq: Four times a day (QID) | TRANSDERMAL | 0 refills | Status: DC

## 2018-08-01 NOTE — Progress Notes (Signed)
Office Visit Note   [name]           Date of Birth: 05-Sep-1960           MRN: [id number] Visit Date: 08/01/2018              Requested by: Carlena Hurl, PA-C [address] [place], [place] [address] PCP: Carlena Hurl, PA-C   Assessment & Plan: Visit Diagnoses:  1. Chronic pain of left knee     Plan: Pain appears to be localized about the left knee.  Has prior history of osteoarthritis of the left knee by plain film.  Also has a history of rheumatoid arthritis-seropositive- treated by Dr. Estanislado Pandy with Plaquenil and meloxicam.  Also takes gabapentin.  No recent injury or trauma.  Long discussion regarding treatment options.  Would suggest a cortisone injection but she refuses.  We will try a knee support.  Follow-up with Dr. Estanislado Pandy in June.  Follow-Up Instructions: Return if symptoms worsen or fail to improve.   Orders:  No orders of the defined types were placed in this encounter.  No orders of the defined types were placed in this encounter.     Procedures: No procedures performed   Clinical Data: No additional findings.   Subjective: Chief Complaint  Patient presents with  . Left Leg - Pain  Patient presents today for left leg pain. She said that after she was released for her ankle and elbow her left leg started hurting. She said that she fell on her left side in December. She is complaining of pain in her left hip and in her left knee the most. She has a difficult time getting comfortable at night. She is taking tylenol and meloxicam. She also states that her leg is swollen. She has been elevating her leg and wearing a support stocking for her lower leg.  History of seropositive rheumatoid arthritis being followed by Dr. Estanislado Pandy.  Has multiple joint involvement.  Has been taking Plaquenil and meloxicam.  Also taking gabapentin.  Has had prior films demonstrating tricompartmental arthritis of the left knee particularly in the medial  compartment.  These were reviewed today. Returned to work last month and then was laid off because of the COVID virus.  HPI  Review of Systems  Constitutional: Negative for fatigue.  HENT: Negative for ear pain.   Eyes: Negative for pain.  Respiratory: Negative for shortness of breath.   Cardiovascular: Positive for leg swelling.  Gastrointestinal: Positive for constipation. Negative for diarrhea.  Endocrine: Negative for cold intolerance and heat intolerance.  Genitourinary: Negative for difficulty urinating.  Musculoskeletal: Positive for joint swelling.  Skin: Negative for rash.  Allergic/Immunologic: Negative for food allergies.  Neurological: Negative for weakness.  Hematological: Does not bruise/bleed easily.  Psychiatric/Behavioral: Positive for sleep disturbance.     Objective: Vital Signs: BP 110/85   Pulse 87   Ht 5\' 9"  (1.753 m)   Wt 215 lb (97.5 kg)   LMP 02/23/2015   BMI 31.75 kg/m   Physical Exam Constitutional:      Appearance: She is well-developed.  Eyes:     Pupils: Pupils are equal, round, and reactive to light.  Pulmonary:     Effort: Pulmonary effort is normal.  Skin:    General: Skin is warm and dry.  Neurological:     Mental Status: She is alert and oriented to person, place, and time.  Psychiatric:        Behavior: Behavior normal.  Ortho Exam awake alert and oriented x3.  Comfortable sitting.  Straight leg raise negative.  Does have some chronic pain over the greater trochanter of her left hip which is unchanged.  No pain with range of motion of the left hip.  Left knee was not hot red warm or swollen.  No obvious effusion.  There was medial and lateral joint pain and some mild patellar crepitation.  Some discomfort along the lateral aspect of her leg but no calf pain.  Full extension and flexion over 105 degrees without instability  Specialty Comments:  No specialty comments available.  Imaging: No results found.   PMFS History:  Patient Active Problem List   Diagnosis Date Noted  . Chronic pain of left knee 07/08/2018  . Chronic hip pain 07/08/2018  . Edema 07/08/2018  . Left foot pain 04/08/2018  . Olecranon bursitis of left elbow 04/05/2018  . Leg swelling 12/05/2017  . Need for shingles vaccine 12/05/2017  . Primary osteoarthritis of both hands 11/23/2017  . Rheumatoid factor positive 11/23/2017  . Primary osteoarthritis of both knees 11/23/2017  . Primary osteoarthritis of both feet 11/23/2017  . DDD (degenerative disc disease), lumbar 11/23/2017  . Vaccine counseling 08/13/2017  . Polyarthralgia 08/13/2017  . Joint stiffness 08/13/2017  . Sensitive skin 01/23/2017  . Need for influenza vaccination 01/23/2017  . Proteinuria 01/23/2017  . History of gastrointestinal stromal tumor (GIST) 04/20/2016  . History of TIA (transient ischemic attack) 04/20/2016  . Screening for breast cancer 04/20/2016  . Estrogen deficiency 04/20/2016  . Chronic maxillary sinusitis 04/20/2016  . Impaired fasting blood sugar 04/20/2016  . Constipation 04/20/2016  . History of fall 04/20/2016  . Chronic radicular lumbar pain 01/27/2016  . Encounter for health maintenance examination in adult 03/22/2015  . Paresthesia 03/22/2015  . Cognitive decline 03/22/2015  . Screening for cervical cancer 03/22/2015  . Vitamin D deficiency 03/22/2015  . History of uterine leiomyoma 03/22/2015  . Gastroesophageal reflux disease without esophagitis 03/02/2014  . Chronic nausea 03/02/2014  . Chronic abdominal pain 03/02/2014  . Rhinitis, allergic 03/02/2014  . Essential hypertension 03/02/2014  . Hyperlipidemia 03/02/2014  . Class 1 obesity with serious comorbidity in adult 01/16/2012   Past Medical History:  Diagnosis Date  . Allergy   . Anemia    iron therapy for years as of 10/12; normal Hgb 08/2013  . Arthritis   . Chest pain 04/05/2011   cardiac eval, normal treadmill stress test, Dr. Tollie Eth  . Chronic back pain   .  Constipation   . Farsightedness    wears glasses, [facility] care center  . 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 [name], [facility]  . GERD (gastroesophageal reflux disease)   . History of uterine fibroid   . Hyperlipidemia   . Hypertension   . Paresthesia 09/2014   initially thought to be TIA, neurology consult in 12/2014 with other non TIA considerations.    . Polyarthralgia    normal rheumatoid screen 01/2012    Family History  Problem Relation Age of Onset  . Hypertension Mother   . Arthritis Mother   . GER disease Mother   . Glaucoma Mother   . Breast cancer Mother   . Stroke Father   . Breast cancer Sister        breast cancer dx late 28s  . Hypertension Sister   . Colon cancer Brother 54  . Diabetes Brother   . Diabetes Maternal Aunt   .  Heart disease Maternal Aunt   . Heart disease Maternal Grandmother   . Heart disease Maternal Grandfather   . Heart disease Maternal Uncle   . Stroke Maternal Uncle   . Leukemia Sister   . Hypertension Sister   . Healthy Son   . Colon polyps Neg Hx     Past Surgical History:  Procedure Laterality Date  . COLONOSCOPY  01/2014   diverticulosis, othwerise normal - Dr. Owens Loffler  . ESOPHAGOGASTRODUODENOSCOPY  2013   Dr. Benson Norway, gastritis  . ESOPHAGOGASTRODUODENOSCOPY  [id number]  . EUS N/A 04/16/2014   Procedure: UPPER ENDOSCOPIC ULTRASOUND (EUS) LINEAR;  Surgeon: Milus Banister, MD;  Location: [facility];  Service: Endoscopy;  Laterality: N/A;  . gall stone surgery    . KNEE ARTHROSCOPY Left   . LAPAROSCOPIC GASTRIC RESECTION N/A 06/09/2014   Procedure: LAPAROSCOPIC GASTRIC MASS RESECTION;  Surgeon: [name], MD;  Location: [facility];  Service: General;  Laterality: N/A;  . LIPOMA EXCISION     forehead  . UTERINE FIBROID SURGERY     Social History   Occupational History  . Occupation: IT sales professional: [employer]  Tobacco Use  . Smoking status: Never Smoker  . Smokeless  tobacco: Never Used  Substance and Sexual Activity  . Alcohol use: No    Alcohol/week: 0.0 standard drinks  . Drug use: No  . Sexual activity: Not on file

## 2018-08-05 ENCOUNTER — Telehealth: Payer: Self-pay | Admitting: Orthopaedic Surgery

## 2018-08-05 NOTE — Telephone Encounter (Signed)
Patient calling to discuss disability paperwork that was sent in for her. Per patient, she had an accident when she hurt her elbow, and ankle. Also, she had a fracture in her elbow, and ankle. Paperwork should have been filled out to say that, not what [employer] put. Please call to discuss with patient. Patient knows doctor will not be in the office until Tuesday.

## 2018-08-06 NOTE — Telephone Encounter (Signed)
Please see below and call patient to discuss. Thank you.

## 2018-08-06 NOTE — Telephone Encounter (Signed)
Spoke with patient. She has already spoke with [employer] and resolved the matter.

## 2018-08-09 ENCOUNTER — Telehealth: Payer: Self-pay | Admitting: Orthopaedic Surgery

## 2018-08-09 NOTE — Telephone Encounter (Signed)
sPatient left a voicemail stating she needs her leg and ankle added to her therapy referral.  Patient requested a return call.

## 2018-08-13 NOTE — Telephone Encounter (Signed)
OK TO DO

## 2018-08-13 NOTE — Telephone Encounter (Signed)
Please advise 

## 2018-08-15 ENCOUNTER — Other Ambulatory Visit: Payer: Self-pay | Admitting: *Deleted

## 2018-08-15 DIAGNOSIS — M79605 Pain in left leg: Secondary | ICD-10-CM

## 2018-08-15 DIAGNOSIS — M25572 Pain in left ankle and joints of left foot: Secondary | ICD-10-CM

## 2018-08-22 NOTE — Progress Notes (Signed)
Office Visit Note  [name]             Date of Birth: 1960/07/09           MRN: [id number]             PCP: Carlena Hurl, PA-C Referring: Carlena Hurl, PA-C Visit Date: 09/04/2018 Occupation: @GUAROCC @  Subjective:  Pain in both knee joints    History of Present Illness: Lisa Briggs is a 58 y.o. female with history of seropositive rheumatoid arthritis and osteoarthritis.  She is taking Plaquenil 200 mg BID.  She has not had any recent rheumatoid arthritis flares.  She states that her left elbow olecranon bursitis has resolved. She is no longer on antibiotics. She reports pain in both knee joints but denies any joint swelling.  She states she experiences lower back stiffness if she sits for prolonged periods of time.  She denies any other joint pain or joint swelling.   Activities of Daily Living:  Patient reports morning stiffness for several hours.   Patient Reports nocturnal pain.  Difficulty dressing/grooming: Denies Difficulty climbing stairs: Reports Difficulty getting out of chair: Reports Difficulty using hands for taps, buttons, cutlery, and/or writing: Denies  Review of Systems  Constitutional: Positive for fatigue.  HENT: Positive for mouth dryness. Negative for mouth sores and nose dryness.   Eyes: Positive for dryness. Negative for pain and visual disturbance.  Respiratory: Negative for cough, hemoptysis, shortness of breath and difficulty breathing.   Cardiovascular: Negative for chest pain, palpitations, hypertension and swelling in legs/feet.  Gastrointestinal: Positive for constipation. Negative for blood in stool and diarrhea.  Endocrine: Negative for increased urination.  Genitourinary: Negative for painful urination.  Musculoskeletal: Positive for arthralgias, joint pain and morning stiffness. Negative for joint swelling, myalgias, muscle weakness, muscle tenderness and myalgias.  Skin: Negative for color change, pallor, rash, hair loss,  nodules/bumps, skin tightness, ulcers and sensitivity to sunlight.  Allergic/Immunologic: Negative for susceptible to infections.  Neurological: Negative for dizziness, numbness, headaches and weakness.  Hematological: Negative for swollen glands.  Psychiatric/Behavioral: Positive for depressed mood. Negative for sleep disturbance. The patient is nervous/anxious.     PMFS History:  Patient Active Problem List   Diagnosis Date Noted  . SOB (shortness of breath) 08/30/2018  . Chronic pain of left knee 07/08/2018  . Chronic hip pain 07/08/2018  . Edema 07/08/2018  . Left foot pain 04/08/2018  . Olecranon bursitis of left elbow 04/05/2018  . Leg swelling 12/05/2017  . Need for shingles vaccine 12/05/2017  . Primary osteoarthritis of both hands 11/23/2017  . Rheumatoid factor positive 11/23/2017  . Primary osteoarthritis of both knees 11/23/2017  . Primary osteoarthritis of both feet 11/23/2017  . DDD (degenerative disc disease), lumbar 11/23/2017  . Vaccine counseling 08/13/2017  . Polyarthralgia 08/13/2017  . Joint stiffness 08/13/2017  . Sensitive skin 01/23/2017  . Need for influenza vaccination 01/23/2017  . Proteinuria 01/23/2017  . History of gastrointestinal stromal tumor (GIST) 04/20/2016  . History of TIA (transient ischemic attack) 04/20/2016  . Screening for breast cancer 04/20/2016  . Estrogen deficiency 04/20/2016  . Chronic maxillary sinusitis 04/20/2016  . Impaired fasting blood sugar 04/20/2016  . Constipation 04/20/2016  . History of fall 04/20/2016  . Chronic radicular lumbar pain 01/27/2016  . Encounter for health maintenance examination in adult 03/22/2015  . Paresthesia 03/22/2015  . Cognitive decline 03/22/2015  . Screening for cervical cancer 03/22/2015  . Vitamin D deficiency 03/22/2015  . History of  uterine leiomyoma 03/22/2015  . Gastroesophageal reflux disease without esophagitis 03/02/2014  . Chronic nausea 03/02/2014  . Chronic abdominal pain  03/02/2014  . Rhinitis, allergic 03/02/2014  . Essential hypertension 03/02/2014  . Hyperlipidemia 03/02/2014    Past Medical History:  Diagnosis Date  . Allergy   . Anemia    iron therapy for years as of 10/12; normal Hgb 08/2013  . Arthritis   . Chest pain 04/05/2011   cardiac eval, normal treadmill stress test, Dr. Tollie Eth  . Chronic back pain   . Constipation   . Farsightedness    wears glasses, Eye care center  . 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 [name], [facility]  . GERD (gastroesophageal reflux disease)   . History of uterine fibroid   . Hyperlipidemia   . Hypertension   . Paresthesia 09/2014   initially thought to be TIA, neurology consult in 12/2014 with other non TIA considerations.    . Polyarthralgia    normal rheumatoid screen 01/2012    Family History  Problem Relation Age of Onset  . Hypertension Mother   . Arthritis Mother   . GER disease Mother   . Glaucoma Mother   . Breast cancer Mother   . Stroke Father   . Breast cancer Sister        breast cancer dx late 27s  . Hypertension Sister   . Colon cancer Brother 47  . Diabetes Brother   . Diabetes Maternal Aunt   . Heart disease Maternal Aunt   . Heart disease Maternal Grandmother   . Heart disease Maternal Grandfather   . Heart disease Maternal Uncle   . Stroke Maternal Uncle   . Leukemia Sister   . Hypertension Sister   . Healthy Son   . Colon polyps Neg Hx    Past Surgical History:  Procedure Laterality Date  . COLONOSCOPY  01/2014   diverticulosis, othwerise normal - Dr. Owens Loffler  . ESOPHAGOGASTRODUODENOSCOPY  2013   Dr. Benson Norway, gastritis  . ESOPHAGOGASTRODUODENOSCOPY  [id number]  . EUS N/A 04/16/2014   Procedure: UPPER ENDOSCOPIC ULTRASOUND (EUS) LINEAR;  Surgeon: Milus Banister, MD;  Location: [facility];  Service: Endoscopy;  Laterality: N/A;  . gall stone surgery    . KNEE ARTHROSCOPY Left   . LAPAROSCOPIC GASTRIC RESECTION N/A  06/09/2014   Procedure: LAPAROSCOPIC GASTRIC MASS RESECTION;  Surgeon: [name], MD;  Location: [facility] ORS;  Service: General;  Laterality: N/A;  . LIPOMA EXCISION     forehead  . UTERINE FIBROID SURGERY     Social History   Social History Narrative   Married, has 1 son in [place] and some grandchildren.  Glass blower/designer.  Active on job.  Does stretching and exercises daily as per physical therapy.  Works 12 hours daily.     Immunization History  Administered Date(s) Administered  . Influenza,inj,Quad PF,6+ Mos 11/24/2013, 01/18/2015, 01/23/2017, 12/05/2017  . Pneumococcal Polysaccharide-23 05/21/2015  . Tdap 01/17/2011  . Zoster Recombinat (Shingrix) 10/05/2017, 12/07/2017     Objective: Vital Signs: BP 112/72 (BP Location: Left Wrist, Patient Position: Sitting, Cuff Size: Normal)   Pulse 79   Resp 15   Ht 5\' 9"  (1.753 m)   Wt 236 lb (107 kg)   LMP 02/23/2015   BMI 34.85 kg/m    Physical Exam Vitals signs and nursing note reviewed.  Constitutional:      Appearance: She is well-developed.  HENT:     Head: Normocephalic and atraumatic.  Eyes:     Conjunctiva/sclera: Conjunctivae normal.  Neck:     Musculoskeletal: Normal range of motion.  Cardiovascular:     Rate and Rhythm: Normal rate and regular rhythm.     Heart sounds: Normal heart sounds.  Pulmonary:     Effort: Pulmonary effort is normal.     Breath sounds: Normal breath sounds.  Abdominal:     General: Bowel sounds are normal.     Palpations: Abdomen is soft.  Lymphadenopathy:     Cervical: No cervical adenopathy.  Skin:    General: Skin is warm and dry.     Capillary Refill: Capillary refill takes less than 2 seconds.  Neurological:     Mental Status: She is alert and oriented to person, place, and time.  Psychiatric:        Behavior: Behavior normal.      Musculoskeletal Exam: C-spine, thoracic spine, and lumbar spine good ROM.  No midline spinal tenderness.  No SI joint tenderness.  Shoulder  joints, elbow joints, wrist joints, MCPs, PIPs, and DIPs good ROM with no synovitis.  Left elbow olecranon bursitis has resolved.  Hip joints, knee joints, ankle joints, MTPs, PIPs, and DIPs good ROM with no synovitis.  No warmth or effusion of knee joints.  No tenderness or swelling of ankle joints.  No tenderness over trochanteric bursa bilaterally.    CDAI Exam: CDAI Score: Not documented Patient Global Assessment: Not documented; Provider Global Assessment: Not documented Swollen: Not documented; Tender: Not documented Joint Exam   Not documented   There is currently no information documented on the homunculus. Go to the Rheumatology activity and complete the homunculus joint exam.  Investigation: No additional findings.  Imaging: No results found.  Recent Labs: Lab Results  Component Value Date   WBC 6.5 05/08/2018   HGB 11.8 05/08/2018   PLT 277 05/08/2018   NA 142 08/30/2018   K 4.2 08/30/2018   CL 101 08/30/2018   CO2 28 08/30/2018   GLUCOSE 83 08/30/2018   BUN 15 08/30/2018   CREATININE 0.70 08/30/2018   BILITOT 0.7 04/05/2018   ALKPHOS 96 04/20/2016   AST 20 04/05/2018   ALT 22 04/05/2018   PROT 6.0 (L) 04/05/2018   ALBUMIN 3.7 04/20/2016   CALCIUM 9.6 08/30/2018   GFRAA 112 08/30/2018    Speciality Comments: PLQ Eye Exam: 12/18/17 W[facility] @ [facility] Follow up in 1 year  Procedures:  No procedures performed Allergies: Kiwi extract; Mucinex dm [dm-guaifenesin er]; and Peach [prunus persica]   Assessment / Plan:     Visit Diagnoses: Rheumatoid arthritis involving multiple sites with positive rheumatoid factor ([place]) - +RF, +CCP: She has no synovitis on exam.  She has not had any recent rheumatoid arthritis flares.  She is clinically doing well on Plaquenil 10 mg 1 tablet by mouth twice daily.  She has chronic pain in bilateral knee joints and experiences pain and stiffness in her lower back if she sits for prolonged periods of time.  She has no  warmth or effusion of her knee joints on exam today.  She declined cortisone injections.  She has no other joint pain or joint swelling at this time.  She will continue on Plaquenil 200 mg 1 tablet twice daily.  She does not need any refills at this time.  She advised to notify us if she has increased joint pain or joint swelling.  She will follow-up in the office in 5 months  High risk medication use -  Plaquenil 200 mg 1 tablet by mouth BID.  She was started on PLQ in September 2019.  Last Plaquenil eye exam normal on 12/18/2017.  Most recent CBC/CMP within normal limits except for low hemoglobin/hematocrit on 04/11/2018.  Will monitor CBC/CMP every 5 months and standing orders are in place.  Primary osteoarthritis of both hands: She has no tenderness or joint pain at this time.  She has no synovitis on exam.  She has complete fist formation bilaterally.  Joint protection and muscle strengthening were discussed.  Primary osteoarthritis of both knees: Chronic pain.  She has no warmth or effusion of knee joints.  She has good ROM with some discomfort.  She had x-rays of bilateral knee joints on 10/24/2017 that revealed moderate osteoarthritis and severe chondromalacia patella.  She declined a cortisone injection today.  Primary osteoarthritis of both feet: She has no feet pain or joint swelling.  She wears proper fitting shoes.   DDD (degenerative disc disease), lumbar: She experiences pain and stiffness in her lower back if she sits for a prolonged period of time.   Septic olecranon bursitis of left elbow: Resolved.   Vitamin D deficiency: She takes a vitamin D supplement.   Other medical conditions are listed as follows:   Essential hypertension  History of TIA (transient ischemic attack)  Gastroesophageal reflux disease without esophagitis   Orders: No orders of the defined types were placed in this encounter.  No orders of the defined types were placed in this encounter.   Face-to-face  time spent with patient was 30  minutes. Greater than 50% of time was spent in counseling and coordination of care.  Follow-Up Instructions: Return in about 5 months (around 02/04/2019) for Rheumatoid arthritis, Osteoarthritis.   Ofilia Neas, PA-C  Note - This record has been created using Dragon software.  Chart creation errors have been sought, but may not always  have been located. Such creation errors do not reflect on  the standard of medical care.

## 2018-08-23 ENCOUNTER — Other Ambulatory Visit (INDEPENDENT_AMBULATORY_CARE_PROVIDER_SITE_OTHER): Payer: Self-pay | Admitting: Orthopedic Surgery

## 2018-08-28 ENCOUNTER — Other Ambulatory Visit: Payer: Self-pay | Admitting: Medical

## 2018-08-28 NOTE — Telephone Encounter (Signed)
Is this ok to refill?  

## 2018-08-30 ENCOUNTER — Ambulatory Visit (INDEPENDENT_AMBULATORY_CARE_PROVIDER_SITE_OTHER): Payer: BLUE CROSS/BLUE SHIELD | Admitting: Medical

## 2018-08-30 ENCOUNTER — Encounter: Payer: Self-pay | Admitting: Medical

## 2018-08-30 ENCOUNTER — Other Ambulatory Visit: Payer: Self-pay

## 2018-08-30 VITALS — BP 130/80 | HR 90 | Temp 97.5°F | Resp 16 | Ht 69.0 in | Wt 234.8 lb

## 2018-08-30 DIAGNOSIS — M2550 Pain in unspecified joint: Secondary | ICD-10-CM

## 2018-08-30 DIAGNOSIS — R0602 Shortness of breath: Secondary | ICD-10-CM | POA: Insufficient documentation

## 2018-08-30 DIAGNOSIS — I10 Essential (primary) hypertension: Secondary | ICD-10-CM

## 2018-08-30 DIAGNOSIS — Z8673 Personal history of transient ischemic attack (TIA), and cerebral infarction without residual deficits: Secondary | ICD-10-CM

## 2018-08-30 DIAGNOSIS — R609 Edema, unspecified: Secondary | ICD-10-CM

## 2018-08-30 DIAGNOSIS — E785 Hyperlipidemia, unspecified: Secondary | ICD-10-CM | POA: Diagnosis not present

## 2018-08-30 DIAGNOSIS — M25559 Pain in unspecified hip: Secondary | ICD-10-CM | POA: Diagnosis not present

## 2018-08-30 DIAGNOSIS — G8929 Other chronic pain: Secondary | ICD-10-CM

## 2018-08-30 DIAGNOSIS — R7301 Impaired fasting glucose: Secondary | ICD-10-CM | POA: Diagnosis not present

## 2018-08-30 DIAGNOSIS — K5900 Constipation, unspecified: Secondary | ICD-10-CM

## 2018-08-30 DIAGNOSIS — M2560 Stiffness of unspecified joint, not elsewhere classified: Secondary | ICD-10-CM

## 2018-08-30 MED ORDER — LINACLOTIDE 145 MCG PO CAPS: 145.0000 ug | ORAL_CAPSULE | Freq: Every day | ORAL | 0 refills | Status: DC

## 2018-08-30 NOTE — Progress Notes (Signed)
Subjective: Chief Complaint  Patient presents with  . follow up    follow up and labs   Here for chronic issue f/u and follow up from last visit in 07/2018.    She notes some stiffness in hips and legs.  Sometimes has trouble getting out of the stairs or moving around due to the stiffness.  He continues to feel like she has swelling in the back of her knees, on the right side worse of late.  She also says you have swelling in her lower legs.  Mainly in the front of her legs below her knees.  She feels like she continues to need or want to use the extra hydrochlorothiazide fluid pill for swelling  She reports exercise as stretching and walking when she is in the store.  She sometimes feels short of breath when walking.  Denies chest pain but can give out somewhat easily with exercise.  Denies orthopnea  Since being at home with coroanivurs restrictions, has gained about 15lb.   Constipation - since last visit did trial of Linzess 74mcg.  This seems to work pretty good.  Now having BMs either daily or every other day.    May be interested in increaseing dose.  No other new complaint   Past Medical History:  Diagnosis Date  . Allergy   . Anemia    iron therapy for years as of 10/12; normal Hgb 08/2013  . Arthritis   . Chest pain 04/05/2011   cardiac eval, normal treadmill stress test, Dr. Tollie Eth  . Chronic back pain   . Constipation   . Farsightedness    wears glasses, Eye care center  . 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 [name], [facility]  . GERD (gastroesophageal reflux disease)   . History of uterine fibroid   . Hyperlipidemia   . Hypertension   . Paresthesia 09/2014   initially thought to be TIA, neurology consult in 12/2014 with other non TIA considerations.    . Polyarthralgia    normal rheumatoid screen 01/2012   Current Outpatient Medications on File Prior to Visit  Medication Sig Dispense Refill  . aspirin EC 81 MG  tablet Take 1 tablet (81 mg total) by mouth daily. 90 tablet 3  . cholecalciferol (VITAMIN D3) 25 MCG (1000 UT) tablet Take 1 tablet (1,000 Units total) by mouth daily. 90 tablet 3  . cyclobenzaprine (FLEXERIL) 10 MG tablet Take 1 tablet (10 mg total) by mouth 2 (two) times daily as needed for muscle spasms. 20 tablet 2  . dexlansoprazole (DEXILANT) 60 MG capsule Take 1 capsule (60 mg total) by mouth daily. 90 capsule 0  . diclofenac sodium (VOLTAREN) 1 % GEL APPLY 2 GRAMS TO AFFECTED AREA 4 TIMES A DAY 300 g 0  . famotidine (PEPCID) 20 MG tablet Take 1 tablet (20 mg total) by mouth at bedtime. 90 tablet 0  . fluticasone (FLONASE) 50 MCG/ACT nasal spray USE ONE SPRAY(S) IN EACH NOSTRIL DAILY AS NEEDED FOR ALLERGIES OR RHINITIS. 16 g 11  . gabapentin (NEURONTIN) 300 MG capsule TAKE ONE TABLET BY MOUTH IN THE AFTERNOON AND 2 AT BEDTIME 270 capsule 0  . hydrochlorothiazide (HYDRODIURIL) 12.5 MG tablet Take 1 tablet (12.5 mg total) by mouth daily. 90 tablet 0  . hydrocortisone (PROCTOCORT) 1 % CREA Apply 1 application topically daily. 45 g 1  . hydroxychloroquine (PLAQUENIL) 200 MG tablet TAKE 1 TABLET BY MOUTH TWICE A DAY 60 tablet 1  . linaclotide (  LINZESS) 72 MCG capsule Take 1 capsule (72 mcg total) by mouth daily before breakfast. 30 capsule 0  . loratadine (CLARITIN) 10 MG tablet TAKE ONE TABLET BY MOUTH ONCE DAILY 90 tablet 3  . losartan-hydrochlorothiazide (HYZAAR) 50-12.5 MG tablet Take 1 tablet by mouth daily. 90 tablet 0  . meloxicam (MOBIC) 7.5 MG tablet TAKE 1 TABLET BY MOUTH EVERY DAY 90 tablet 0  . ondansetron (ZOFRAN) 4 MG tablet TAKE ONE TABLET BY MOUTH EVERY 8 HOURS AS NEEDED FOR  NAUSEA  OR  VOMITING 45 tablet 0  . potassium chloride (K-DUR,KLOR-CON) 10 MEQ tablet Take 1 tablet (10 mEq total) by mouth 2 (two) times daily. 180 tablet 3  . simvastatin (ZOCOR) 20 MG tablet Take 1 tablet (20 mg total) by mouth daily. 90 tablet 0  . venlafaxine (EFFEXOR) 37.5 MG tablet TAKE 1 TABLET BY  MOUTH ONCE DAILY. 90 tablet 0   No current facility-administered medications on file prior to visit.    ROS as in subjective    Objective: BP 130/80   Pulse 90   Temp (!) 97.5 F (36.4 C) (Temporal)   Resp 16   Ht 5\' 9"  (1.753 m)   Wt 234 lb 12.8 oz (106.5 kg)   LMP 02/23/2015   SpO2 96%   BMI 34.67 kg/m   Wt Readings from Last 3 Encounters:  08/30/18 234 lb 12.8 oz (106.5 kg)  08/01/18 215 lb (97.5 kg)  07/08/18 215 lb (97.5 kg)   General: Well-developed, well-nourished, no acute distress, she visibly appears to have gained weight since last visit Lungs clear no wheezing no rales no rhonchi Heart regular rate and rhythm, normal S1-S2, no murmurs Pulses 2+ throughout DTRs 1+ throughout There appears to be a fullness behind the right knee in the popliteal space compared to the left that could be a Baker's cyst, but I cannot say I see any frank edema in her legs although she reports she feels swollen in the legs.  This may be a misperception.  There is certainly excess fatty tissue in the legs but no frank edema, no tense skin Neck supple, nontender, no lymphadenopathy, no thyromegaly, no JVD   EKG Indication SOB Rate 68 bpm PR interval 113ms QRS 73ms QTC 477ms Axis 45 degrees nsr Possible LVH and left atrial enlargement No acute changes    Assessment: Encounter Diagnoses  Name Primary?  . Essential hypertension Yes  . Impaired fasting blood sugar   . Chronic hip pain, unspecified laterality   . Constipation, unspecified constipation type   . Edema, unspecified type   . History of TIA (transient ischemic attack)   . Hyperlipidemia, unspecified hyperlipidemia type   . Joint stiffness   . Polyarthralgia   . SOB (shortness of breath)      Plan: HTN - BP looks good, c/t current therapy  Edema - she feels improved on the extra HCTZ.  BMET lab today.   Discussed lack of edema findings.   Discussed edema, common symptoms and causes  constipation - improved  on Linzess trial but wants to try higher dose.   Gave samples of 173mcg dose.   Counseled on fiber, water intake  SOB - reviewed EKG, symptoms, recent weight gain.   Last cardiology eval was 2016 with echocardiogram, carotid ultrasound.    F/u pending labs.    Joint stiffness, polyarthralgia - reviewed 04/2018 rheumatology notes.  I don't think her symptoms are new or different than at 04/2018 visit.  F/u with rheumatology, c/t same  therapy per their recommendations.    Hyperlipidemia - lipid profile fasting today.   She is compliant with statin Zocor 20mg  daily.   Charnese was seen today for follow up.  Diagnoses and all orders for this visit:  Essential hypertension -     EKG 12-Lead -     Basic metabolic panel -     Brain natriuretic peptide  Impaired fasting blood sugar  Chronic hip pain, unspecified laterality  Constipation, unspecified constipation type  Edema, unspecified type -     TSH -     Basic metabolic panel -     Brain natriuretic peptide  History of TIA (transient ischemic attack)  Hyperlipidemia, unspecified hyperlipidemia type -     Lipid panel  Joint stiffness  Polyarthralgia  SOB (shortness of breath) -     EKG 12-Lead -     TSH -     Basic metabolic panel -     Brain natriuretic peptide

## 2018-08-31 LAB — LIPID PANEL
Chol/HDL Ratio: 2.2 ratio (ref 0.0–4.4)
Cholesterol, Total: 157 mg/dL (ref 100–199)
HDL: 71 mg/dL (ref >39)
LDL Calculated: 67 mg/dL (ref 0–99)
Triglycerides: 95 mg/dL (ref 0–149)
VLDL Cholesterol Cal: 19 mg/dL (ref 5–40)

## 2018-08-31 LAB — BASIC METABOLIC PANEL
BUN/Creatinine Ratio: 21 (ref 9–23)
BUN: 15 mg/dL (ref 6–24)
CO2: 28 mmol/L (ref 20–29)
Calcium: 9.6 mg/dL (ref 8.7–10.2)
Chloride: 101 mmol/L (ref 96–106)
Creatinine, Ser: 0.7 mg/dL (ref 0.57–1.00)
GFR calc Af Amer: 112 mL/min/{1.73_m2} (ref >59)
GFR calc non Af Amer: 97 mL/min/{1.73_m2} (ref >59)
Glucose: 83 mg/dL (ref 65–99)
Potassium: 4.2 mmol/L (ref 3.5–5.2)
Sodium: 142 mmol/L (ref 134–144)

## 2018-08-31 LAB — TSH: TSH: 1.86 u[IU]/mL (ref 0.450–4.500)

## 2018-08-31 LAB — BRAIN NATRIURETIC PEPTIDE: BNP: 11.2 pg/mL (ref 0.0–100.0)

## 2018-09-02 ENCOUNTER — Ambulatory Visit: Payer: Self-pay | Admitting: Medical

## 2018-09-04 ENCOUNTER — Ambulatory Visit (INDEPENDENT_AMBULATORY_CARE_PROVIDER_SITE_OTHER): Payer: BLUE CROSS/BLUE SHIELD | Admitting: Physician Assistant

## 2018-09-04 ENCOUNTER — Encounter: Payer: Self-pay | Admitting: Physician Assistant

## 2018-09-04 ENCOUNTER — Other Ambulatory Visit: Payer: Self-pay

## 2018-09-04 VITALS — BP 112/72 | HR 79 | Resp 15 | Ht 69.0 in | Wt 236.0 lb

## 2018-09-04 DIAGNOSIS — K219 Gastro-esophageal reflux disease without esophagitis: Secondary | ICD-10-CM

## 2018-09-04 DIAGNOSIS — M0579 Rheumatoid arthritis with rheumatoid factor of multiple sites without organ or systems involvement: Secondary | ICD-10-CM

## 2018-09-04 DIAGNOSIS — M79605 Pain in left leg: Secondary | ICD-10-CM | POA: Diagnosis not present

## 2018-09-04 DIAGNOSIS — M170 Bilateral primary osteoarthritis of knee: Secondary | ICD-10-CM | POA: Diagnosis not present

## 2018-09-04 DIAGNOSIS — M19042 Primary osteoarthritis, left hand: Secondary | ICD-10-CM

## 2018-09-04 DIAGNOSIS — M71122 Other infective bursitis, left elbow: Secondary | ICD-10-CM

## 2018-09-04 DIAGNOSIS — M19041 Primary osteoarthritis, right hand: Secondary | ICD-10-CM | POA: Diagnosis not present

## 2018-09-04 DIAGNOSIS — Z79899 Other long term (current) drug therapy: Secondary | ICD-10-CM

## 2018-09-04 DIAGNOSIS — M545 Low back pain: Secondary | ICD-10-CM | POA: Diagnosis not present

## 2018-09-04 DIAGNOSIS — M79604 Pain in right leg: Secondary | ICD-10-CM | POA: Diagnosis not present

## 2018-09-04 DIAGNOSIS — Z8673 Personal history of transient ischemic attack (TIA), and cerebral infarction without residual deficits: Secondary | ICD-10-CM

## 2018-09-04 DIAGNOSIS — M19071 Primary osteoarthritis, right ankle and foot: Secondary | ICD-10-CM

## 2018-09-04 DIAGNOSIS — E559 Vitamin D deficiency, unspecified: Secondary | ICD-10-CM

## 2018-09-04 DIAGNOSIS — M5136 Other intervertebral disc degeneration, lumbar region: Secondary | ICD-10-CM

## 2018-09-04 DIAGNOSIS — M19072 Primary osteoarthritis, left ankle and foot: Secondary | ICD-10-CM

## 2018-09-04 DIAGNOSIS — I10 Essential (primary) hypertension: Secondary | ICD-10-CM

## 2018-09-04 DIAGNOSIS — M546 Pain in thoracic spine: Secondary | ICD-10-CM | POA: Diagnosis not present

## 2018-09-04 NOTE — Patient Instructions (Signed)

## 2018-09-10 DIAGNOSIS — M546 Pain in thoracic spine: Secondary | ICD-10-CM | POA: Diagnosis not present

## 2018-09-10 DIAGNOSIS — M79605 Pain in left leg: Secondary | ICD-10-CM | POA: Diagnosis not present

## 2018-09-10 DIAGNOSIS — M79604 Pain in right leg: Secondary | ICD-10-CM | POA: Diagnosis not present

## 2018-09-10 DIAGNOSIS — M545 Low back pain: Secondary | ICD-10-CM | POA: Diagnosis not present

## 2018-09-11 ENCOUNTER — Telehealth: Payer: Self-pay | Admitting: Medical

## 2018-09-11 NOTE — Telephone Encounter (Signed)
Pt called and is needing  A a refill on her linzess, pt states it is working, pt would like it sent to the [id number] - [place], [place]

## 2018-09-11 NOTE — Telephone Encounter (Signed)
Pt also called back and states she needs a refill on her Mobic states she is taking 2 day states that is what she is suppose to be taking

## 2018-09-12 ENCOUNTER — Other Ambulatory Visit: Payer: Self-pay | Admitting: Medical

## 2018-09-12 DIAGNOSIS — M545 Low back pain: Secondary | ICD-10-CM | POA: Diagnosis not present

## 2018-09-12 DIAGNOSIS — K2950 Unspecified chronic gastritis without bleeding: Secondary | ICD-10-CM

## 2018-09-12 DIAGNOSIS — M79604 Pain in right leg: Secondary | ICD-10-CM | POA: Diagnosis not present

## 2018-09-12 DIAGNOSIS — M79605 Pain in left leg: Secondary | ICD-10-CM | POA: Diagnosis not present

## 2018-09-12 DIAGNOSIS — M546 Pain in thoracic spine: Secondary | ICD-10-CM | POA: Diagnosis not present

## 2018-09-12 MED ORDER — MELOXICAM 7.5 MG PO TABS: 7.5000 mg | ORAL_TABLET | Freq: Two times a day (BID) | ORAL | 0 refills | Status: DC

## 2018-09-12 NOTE — Telephone Encounter (Signed)
I changed script to reflect BID dosing

## 2018-09-13 ENCOUNTER — Other Ambulatory Visit: Payer: Self-pay | Admitting: Medical

## 2018-09-13 ENCOUNTER — Other Ambulatory Visit: Payer: Self-pay

## 2018-09-13 ENCOUNTER — Telehealth: Payer: Self-pay | Admitting: Family Medicine

## 2018-09-13 ENCOUNTER — Ambulatory Visit (INDEPENDENT_AMBULATORY_CARE_PROVIDER_SITE_OTHER): Payer: BC Managed Care – PPO | Admitting: Medical

## 2018-09-13 ENCOUNTER — Encounter: Payer: Self-pay | Admitting: Medical

## 2018-09-13 VITALS — BP 110/74 | HR 88 | Temp 98.2°F | Ht 69.0 in | Wt 232.0 lb

## 2018-09-13 DIAGNOSIS — M25562 Pain in left knee: Secondary | ICD-10-CM

## 2018-09-13 DIAGNOSIS — E669 Obesity, unspecified: Secondary | ICD-10-CM

## 2018-09-13 DIAGNOSIS — M25561 Pain in right knee: Secondary | ICD-10-CM | POA: Diagnosis not present

## 2018-09-13 DIAGNOSIS — T148XXA Other injury of unspecified body region, initial encounter: Secondary | ICD-10-CM | POA: Diagnosis not present

## 2018-09-13 DIAGNOSIS — G8929 Other chronic pain: Secondary | ICD-10-CM

## 2018-09-13 DIAGNOSIS — M170 Bilateral primary osteoarthritis of knee: Secondary | ICD-10-CM

## 2018-09-13 MED ORDER — LINACLOTIDE 145 MCG PO CAPS: 145.0000 ug | ORAL_CAPSULE | Freq: Every day | ORAL | 2 refills | Status: DC

## 2018-09-13 MED ORDER — HYDROCODONE-ACETAMINOPHEN 5-325 MG PO TABS: 1.0000 | ORAL_TABLET | Freq: Four times a day (QID) | ORAL | 0 refills | Status: DC | PRN

## 2018-09-13 NOTE — Addendum Note (Signed)
Addended by: Carlena Hurl on: 09/13/2018 04:08 PM   Modules accepted: Orders

## 2018-09-13 NOTE — Progress Notes (Signed)
Subjective:  Lisa Briggs is a 58 y.o. female who presents for Chief Complaint  Patient presents with  . Knee Pain    bilateral x2 months   . Medication Refill    flexeril for spasm      Here for bilat knee pain.   Has hx/o arthritis and chronic pain in both knees, worse pain in last 2 months.   Sees rheumatology and is on medication for multiple joint pains.  Sees physical therapy 2 days per week for polyarthralgia.  Just started with PT last 2 weeks.   Went to psychical therapy yesterday.   Was wanted shot of steroid in knees to give comfort.  She notes rheumatology offered this prior at earlier visits.  She has never had joint injection, but couldn't get in touch with rheumatology office today.      No specific knee swelling.   Mainly pain is across front of both knees.   No recent trauma, injury, or fall.   No fever.  No other aggravating or relieving factors.    She also notes some random bruising.  Gets some bruising on her hips, upper arms, has bruise currently on left upper arm. Denies specific trauma or injury.    Obesity - she reports walking and stretching for exercise regularly.  Diet recall: sometimes eats once daily, sometimes 3 meals daily, sometimes doesn't feel like eating.    Typical breakfast is:  Grits and bacon in morning Typical lunch - Apple and cranapple juice  Typical supper - meat and veggie Sometimes eats junk food.   No other c/o.  The following portions of the patient's history were reviewed and updated as appropriate: allergies, current medications, past family history, past medical history, past social history, past surgical history and problem list.  ROS Otherwise as in subjective above    Objective: BP 110/74   Pulse 88   Temp 98.2 F (36.8 C)   Ht 5\' 9"  (1.753 m)   Wt 232 lb (105.2 kg)   LMP 02/23/2015   SpO2 98%   BMI 34.26 kg/m    Wt Readings from Last 3 Encounters:  09/13/18 232 lb (105.2 kg)  09/04/18 236 lb (107 kg)  08/30/18 234  lb 12.8 oz (106.5 kg)   General appearance: alert, no distress, well developed, well nourished, obese AA female Skin: unremarkable, no erythema or bruising of either knee.   5cm x 4cm squarish bruise purplish coloration of left anterior bicep middle 1/3 upper arm MSK: mild pain generally over anterior portion of both knees, but no obvious swelling or deformity, seems to have some mild pain with ROM although ROM seems full, no laxity.  There is some mild crepitus of bilat patellas Pedal pulses 2+, no cyanosis Ext: no obvious edema of LE Neuro: legs with normal strength, sensation and DTR   Assessment: Encounter Diagnoses  Name Primary?  . Chronic pain of left knee Yes  . Chronic pain of right knee   . Primary osteoarthritis of both knees   . Obesity with serious comorbidity, unspecified classification, unspecified obesity type   . Bruising      Plan: She requests steroid injection today.   I reviewed her orthopedic notes from 08/01/18 with Dr. Durward Fortes.  At that time he recommended and offered cortisone steroid injection but she declined.  I reviewed her most recent rheumatology notes from 09/04/18 as well.   She continues on Plaquenil, continues on Meloxicam.  She continues with physical therapy 2 days per week.  I advised that since she sees rheumatology already, they should handle her joint issues.  I did not injection her knees today, will defer back to rheumatology  Bruising - likely from bumping into things although she denies this.  Will check CBC to further eval  Obesity - discussed concerns.  Despite trying to counsel on diet and exercise, she reports she is exercising already and doesn't need help with diet.   She declined dietician referral or weight management consult.   Still working full time.    Lisa Briggs was seen today for knee pain and medication refill.  Diagnoses and all orders for this visit:  Chronic pain of left knee  Chronic pain of right knee  Primary  osteoarthritis of both knees  Obesity with serious comorbidity, unspecified classification, unspecified obesity type  Bruising -     CBC with Differential/Platelet    Follow up: pending labs

## 2018-09-13 NOTE — Telephone Encounter (Signed)
[facility] m[facility] then [facility].

## 2018-09-14 LAB — CBC WITH DIFFERENTIAL/PLATELET
Basophils Absolute: 0 10*3/uL (ref 0.0–0.2)
Basos: 0 %
EOS (ABSOLUTE): 0.2 10*3/uL (ref 0.0–0.4)
Eos: 4 %
Hematocrit: 38.1 % (ref 34.0–46.6)
Hemoglobin: 12.4 g/dL (ref 11.1–15.9)
Immature Grans (Abs): 0 10*3/uL (ref 0.0–0.1)
Immature Granulocytes: 0 %
Lymphocytes Absolute: 2.8 10*3/uL (ref 0.7–3.1)
Lymphs: 41 %
MCH: 27 pg (ref 26.6–33.0)
MCHC: 32.5 g/dL (ref 31.5–35.7)
MCV: 83 fL (ref 79–97)
Monocytes Absolute: 0.5 10*3/uL (ref 0.1–0.9)
Monocytes: 8 %
Neutrophils Absolute: 3.2 10*3/uL (ref 1.4–7.0)
Neutrophils: 47 %
Platelets: 362 10*3/uL (ref 150–450)
RBC: 4.6 x10E6/uL (ref 3.77–5.28)
RDW: 12.1 % (ref 11.7–15.4)
WBC: 6.8 10*3/uL (ref 3.4–10.8)

## 2018-09-16 ENCOUNTER — Other Ambulatory Visit: Payer: Self-pay | Admitting: Medical

## 2018-09-16 DIAGNOSIS — M79604 Pain in right leg: Secondary | ICD-10-CM | POA: Diagnosis not present

## 2018-09-16 DIAGNOSIS — M79605 Pain in left leg: Secondary | ICD-10-CM | POA: Diagnosis not present

## 2018-09-16 DIAGNOSIS — M546 Pain in thoracic spine: Secondary | ICD-10-CM | POA: Diagnosis not present

## 2018-09-16 DIAGNOSIS — M545 Low back pain: Secondary | ICD-10-CM | POA: Diagnosis not present

## 2018-09-16 DIAGNOSIS — K2950 Unspecified chronic gastritis without bleeding: Secondary | ICD-10-CM

## 2018-09-16 MED ORDER — DEXILANT 60 MG PO CPDR: 60.0000 mg | DELAYED_RELEASE_CAPSULE | Freq: Every day | ORAL | 2 refills | Status: DC

## 2018-09-16 MED ORDER — CYCLOBENZAPRINE HCL 10 MG PO TABS: 10.0000 mg | ORAL_TABLET | Freq: Two times a day (BID) | ORAL | 0 refills | Status: DC | PRN

## 2018-09-16 MED ORDER — LINACLOTIDE 145 MCG PO CAPS: 145.0000 ug | ORAL_CAPSULE | Freq: Every day | ORAL | 2 refills | Status: DC

## 2018-09-16 MED ORDER — VENLAFAXINE HCL 37.5 MG PO TABS: 37.5000 mg | ORAL_TABLET | Freq: Every day | ORAL | 1 refills | Status: DC

## 2018-09-18 DIAGNOSIS — M79605 Pain in left leg: Secondary | ICD-10-CM | POA: Diagnosis not present

## 2018-09-18 DIAGNOSIS — M546 Pain in thoracic spine: Secondary | ICD-10-CM | POA: Diagnosis not present

## 2018-09-18 DIAGNOSIS — M545 Low back pain: Secondary | ICD-10-CM | POA: Diagnosis not present

## 2018-09-18 DIAGNOSIS — M79604 Pain in right leg: Secondary | ICD-10-CM | POA: Diagnosis not present

## 2018-09-20 ENCOUNTER — Telehealth: Payer: Self-pay | Admitting: Medical

## 2018-09-20 NOTE — Telephone Encounter (Signed)
Pt called about letter she received from [place] re lab results. She states that she doesn't have the letter with her but states the letter told her to stop her medication and I told her that is not what the letter is saying. Advised her that per the letter Audelia Acton wants her to lose weight so that with losing weight she can stop some of the medications that she is on. She again asked about what medication to stop and I advised her not to stop her meds that he is wanting her to lose weight and that he has suggestions in the letter about what she can do. Advised her to read over the letter again and let us know if she has any more questions

## 2018-09-23 DIAGNOSIS — M546 Pain in thoracic spine: Secondary | ICD-10-CM | POA: Diagnosis not present

## 2018-09-23 DIAGNOSIS — M79604 Pain in right leg: Secondary | ICD-10-CM | POA: Diagnosis not present

## 2018-09-23 DIAGNOSIS — M79605 Pain in left leg: Secondary | ICD-10-CM | POA: Diagnosis not present

## 2018-09-23 DIAGNOSIS — M545 Low back pain: Secondary | ICD-10-CM | POA: Diagnosis not present

## 2018-09-24 ENCOUNTER — Other Ambulatory Visit (INDEPENDENT_AMBULATORY_CARE_PROVIDER_SITE_OTHER): Payer: Self-pay | Admitting: Orthopedic Surgery

## 2018-09-25 DIAGNOSIS — M79605 Pain in left leg: Secondary | ICD-10-CM | POA: Diagnosis not present

## 2018-09-25 DIAGNOSIS — M546 Pain in thoracic spine: Secondary | ICD-10-CM | POA: Diagnosis not present

## 2018-09-25 DIAGNOSIS — M79604 Pain in right leg: Secondary | ICD-10-CM | POA: Diagnosis not present

## 2018-09-25 DIAGNOSIS — M545 Low back pain: Secondary | ICD-10-CM | POA: Diagnosis not present

## 2018-09-28 ENCOUNTER — Other Ambulatory Visit: Payer: Self-pay | Admitting: Rheumatology

## 2018-09-30 ENCOUNTER — Other Ambulatory Visit: Payer: Self-pay | Admitting: Medical

## 2018-09-30 NOTE — Telephone Encounter (Signed)
Is this ok to refill?  

## 2018-09-30 NOTE — Telephone Encounter (Addendum)
Last Visit: 09/04/18 Next Visit: 02/04/19 Labs: 04/11/18 WNL except for low hemoglobin/hematocrit  PLQ Eye Exam: 12/18/17 WNL   Attempted to contact the patient and left message for patient to call the office.

## 2018-10-01 DIAGNOSIS — M79605 Pain in left leg: Secondary | ICD-10-CM | POA: Diagnosis not present

## 2018-10-01 DIAGNOSIS — M545 Low back pain: Secondary | ICD-10-CM | POA: Diagnosis not present

## 2018-10-01 DIAGNOSIS — M546 Pain in thoracic spine: Secondary | ICD-10-CM | POA: Diagnosis not present

## 2018-10-01 DIAGNOSIS — M79604 Pain in right leg: Secondary | ICD-10-CM | POA: Diagnosis not present

## 2018-10-03 DIAGNOSIS — M79605 Pain in left leg: Secondary | ICD-10-CM | POA: Diagnosis not present

## 2018-10-03 DIAGNOSIS — M545 Low back pain: Secondary | ICD-10-CM | POA: Diagnosis not present

## 2018-10-03 DIAGNOSIS — M546 Pain in thoracic spine: Secondary | ICD-10-CM | POA: Diagnosis not present

## 2018-10-03 DIAGNOSIS — M79604 Pain in right leg: Secondary | ICD-10-CM | POA: Diagnosis not present

## 2018-10-03 NOTE — Telephone Encounter (Signed)
Attempted to contact the patient and left message for patient to call the office.  

## 2018-10-08 ENCOUNTER — Other Ambulatory Visit: Payer: Self-pay

## 2018-10-08 DIAGNOSIS — Z79899 Other long term (current) drug therapy: Secondary | ICD-10-CM

## 2018-10-09 ENCOUNTER — Other Ambulatory Visit: Payer: Self-pay | Admitting: Medical

## 2018-10-09 DIAGNOSIS — M79605 Pain in left leg: Secondary | ICD-10-CM | POA: Diagnosis not present

## 2018-10-09 DIAGNOSIS — M546 Pain in thoracic spine: Secondary | ICD-10-CM | POA: Diagnosis not present

## 2018-10-09 DIAGNOSIS — M79604 Pain in right leg: Secondary | ICD-10-CM | POA: Diagnosis not present

## 2018-10-09 DIAGNOSIS — M545 Low back pain: Secondary | ICD-10-CM | POA: Diagnosis not present

## 2018-10-09 LAB — CBC WITH DIFFERENTIAL/PLATELET
Absolute Monocytes: 414 cells/uL (ref 200–950)
Basophils Absolute: 22 cells/uL (ref 0–200)
Basophils Relative: 0.4 %
Eosinophils Absolute: 140 cells/uL (ref 15–500)
Eosinophils Relative: 2.5 %
HCT: 41.2 % (ref 35.0–45.0)
Hemoglobin: 12.9 g/dL (ref 11.7–15.5)
Lymphs Abs: 2514 cells/uL (ref 850–3900)
MCH: 27 pg (ref 27.0–33.0)
MCHC: 31.3 g/dL — ABNORMAL LOW (ref 32.0–36.0)
MCV: 86.2 fL (ref 80.0–100.0)
MPV: 9.8 fL (ref 7.5–12.5)
Monocytes Relative: 7.4 %
Neutro Abs: 2509 cells/uL (ref 1500–7800)
Neutrophils Relative %: 44.8 %
Platelets: 347 10*3/uL (ref 140–400)
RBC: 4.78 10*6/uL (ref 3.80–5.10)
RDW: 11.8 % (ref 11.0–15.0)
Total Lymphocyte: 44.9 %
WBC: 5.6 10*3/uL (ref 3.8–10.8)

## 2018-10-09 LAB — COMPLETE METABOLIC PANEL WITH GFR
AG Ratio: 1.3 (calc) (ref 1.0–2.5)
ALT: 16 U/L (ref 6–29)
AST: 18 U/L (ref 10–35)
Albumin: 4 g/dL (ref 3.6–5.1)
Alkaline phosphatase (APISO): 110 U/L (ref 37–153)
BUN: 19 mg/dL (ref 7–25)
CO2: 31 mmol/L (ref 20–32)
Calcium: 9.8 mg/dL (ref 8.6–10.4)
Chloride: 104 mmol/L (ref 98–110)
Creat: 0.75 mg/dL (ref 0.50–1.05)
GFR, Est African American: 103 mL/min/{1.73_m2} (ref >=60)
GFR, Est Non African American: 89 mL/min/{1.73_m2} (ref >=60)
Globulin: 3.1 g/dL (calc) (ref 1.9–3.7)
Glucose, Bld: 93 mg/dL (ref 65–99)
Potassium: 4.2 mmol/L (ref 3.5–5.3)
Sodium: 142 mmol/L (ref 135–146)
Total Bilirubin: 0.4 mg/dL (ref 0.2–1.2)
Total Protein: 7.1 g/dL (ref 6.1–8.1)

## 2018-10-09 NOTE — Progress Notes (Signed)
stable °

## 2018-10-10 DIAGNOSIS — M546 Pain in thoracic spine: Secondary | ICD-10-CM | POA: Diagnosis not present

## 2018-10-10 DIAGNOSIS — M79605 Pain in left leg: Secondary | ICD-10-CM | POA: Diagnosis not present

## 2018-10-10 DIAGNOSIS — M545 Low back pain: Secondary | ICD-10-CM | POA: Diagnosis not present

## 2018-10-10 DIAGNOSIS — M79604 Pain in right leg: Secondary | ICD-10-CM | POA: Diagnosis not present

## 2018-10-15 DIAGNOSIS — M79604 Pain in right leg: Secondary | ICD-10-CM | POA: Diagnosis not present

## 2018-10-15 DIAGNOSIS — M79605 Pain in left leg: Secondary | ICD-10-CM | POA: Diagnosis not present

## 2018-10-15 DIAGNOSIS — M545 Low back pain: Secondary | ICD-10-CM | POA: Diagnosis not present

## 2018-10-15 DIAGNOSIS — M546 Pain in thoracic spine: Secondary | ICD-10-CM | POA: Diagnosis not present

## 2018-10-17 DIAGNOSIS — M545 Low back pain: Secondary | ICD-10-CM | POA: Diagnosis not present

## 2018-10-17 DIAGNOSIS — M79604 Pain in right leg: Secondary | ICD-10-CM | POA: Diagnosis not present

## 2018-10-17 DIAGNOSIS — M79605 Pain in left leg: Secondary | ICD-10-CM | POA: Diagnosis not present

## 2018-10-17 DIAGNOSIS — M546 Pain in thoracic spine: Secondary | ICD-10-CM | POA: Diagnosis not present

## 2018-10-21 ENCOUNTER — Other Ambulatory Visit: Payer: Self-pay | Admitting: Medical

## 2018-10-23 ENCOUNTER — Other Ambulatory Visit: Payer: Self-pay | Admitting: Medical

## 2018-10-24 DIAGNOSIS — M545 Low back pain: Secondary | ICD-10-CM | POA: Diagnosis not present

## 2018-10-24 DIAGNOSIS — M79604 Pain in right leg: Secondary | ICD-10-CM | POA: Diagnosis not present

## 2018-10-24 DIAGNOSIS — M79605 Pain in left leg: Secondary | ICD-10-CM | POA: Diagnosis not present

## 2018-10-24 DIAGNOSIS — M546 Pain in thoracic spine: Secondary | ICD-10-CM | POA: Diagnosis not present

## 2018-10-28 ENCOUNTER — Telehealth: Payer: Self-pay | Admitting: Medical

## 2018-10-28 DIAGNOSIS — M79604 Pain in right leg: Secondary | ICD-10-CM | POA: Diagnosis not present

## 2018-10-28 DIAGNOSIS — M545 Low back pain: Secondary | ICD-10-CM | POA: Diagnosis not present

## 2018-10-28 DIAGNOSIS — M546 Pain in thoracic spine: Secondary | ICD-10-CM | POA: Diagnosis not present

## 2018-10-28 DIAGNOSIS — M79605 Pain in left leg: Secondary | ICD-10-CM | POA: Diagnosis not present

## 2018-10-28 NOTE — Telephone Encounter (Signed)
Pt called and states that she needs the MICROZIDE tablet 12.5 instead of the capsule states the ones that she uses now is the tablet, please send to the  [id number] [place], [place] also is requesting a refill of 90 of the [facility]

## 2018-10-29 ENCOUNTER — Other Ambulatory Visit: Payer: Self-pay | Admitting: Medical

## 2018-10-29 DIAGNOSIS — R110 Nausea: Secondary | ICD-10-CM

## 2018-10-29 MED ORDER — ONDANSETRON HCL 4 MG PO TABS: ORAL_TABLET | ORAL | 0 refills | Status: DC

## 2018-10-29 MED ORDER — HYDROCHLOROTHIAZIDE 12.5 MG PO TABS: 12.5000 mg | ORAL_TABLET | Freq: Every day | ORAL | 0 refills | Status: DC

## 2018-10-31 DIAGNOSIS — M545 Low back pain: Secondary | ICD-10-CM | POA: Diagnosis not present

## 2018-10-31 DIAGNOSIS — M79605 Pain in left leg: Secondary | ICD-10-CM | POA: Diagnosis not present

## 2018-10-31 DIAGNOSIS — M79604 Pain in right leg: Secondary | ICD-10-CM | POA: Diagnosis not present

## 2018-10-31 DIAGNOSIS — M546 Pain in thoracic spine: Secondary | ICD-10-CM | POA: Diagnosis not present

## 2018-11-05 DIAGNOSIS — M79605 Pain in left leg: Secondary | ICD-10-CM | POA: Diagnosis not present

## 2018-11-05 DIAGNOSIS — M545 Low back pain: Secondary | ICD-10-CM | POA: Diagnosis not present

## 2018-11-05 DIAGNOSIS — M546 Pain in thoracic spine: Secondary | ICD-10-CM | POA: Diagnosis not present

## 2018-11-05 DIAGNOSIS — M79604 Pain in right leg: Secondary | ICD-10-CM | POA: Diagnosis not present

## 2018-11-07 DIAGNOSIS — M546 Pain in thoracic spine: Secondary | ICD-10-CM | POA: Diagnosis not present

## 2018-11-07 DIAGNOSIS — M79604 Pain in right leg: Secondary | ICD-10-CM | POA: Diagnosis not present

## 2018-11-07 DIAGNOSIS — M545 Low back pain: Secondary | ICD-10-CM | POA: Diagnosis not present

## 2018-11-07 DIAGNOSIS — M79605 Pain in left leg: Secondary | ICD-10-CM | POA: Diagnosis not present

## 2018-11-11 ENCOUNTER — Telehealth: Payer: Self-pay | Admitting: Orthopaedic Surgery

## 2018-11-11 NOTE — Telephone Encounter (Signed)
Just an fyi

## 2018-11-11 NOTE — Telephone Encounter (Signed)
Margarita Grizzle from [employer] called to let Dr. 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  If you have questions, I can be reached at [phone]  I called patient and left a message to let her know the appointment would need to be filed under her insurance.

## 2018-11-12 ENCOUNTER — Ambulatory Visit (INDEPENDENT_AMBULATORY_CARE_PROVIDER_SITE_OTHER): Payer: BC Managed Care – PPO | Admitting: Orthopaedic Surgery

## 2018-11-12 ENCOUNTER — Other Ambulatory Visit: Payer: Self-pay

## 2018-11-12 ENCOUNTER — Encounter: Payer: Self-pay | Admitting: Orthopaedic Surgery

## 2018-11-12 ENCOUNTER — Ambulatory Visit: Payer: Self-pay

## 2018-11-12 VITALS — BP 143/95 | HR 72 | Ht 69.0 in | Wt 215.0 lb

## 2018-11-12 DIAGNOSIS — G8929 Other chronic pain: Secondary | ICD-10-CM

## 2018-11-12 DIAGNOSIS — M545 Low back pain: Secondary | ICD-10-CM | POA: Diagnosis not present

## 2018-11-12 DIAGNOSIS — M5416 Radiculopathy, lumbar region: Secondary | ICD-10-CM | POA: Diagnosis not present

## 2018-11-12 DIAGNOSIS — M542 Cervicalgia: Secondary | ICD-10-CM | POA: Diagnosis not present

## 2018-11-12 DIAGNOSIS — M79605 Pain in left leg: Secondary | ICD-10-CM | POA: Diagnosis not present

## 2018-11-12 DIAGNOSIS — M79604 Pain in right leg: Secondary | ICD-10-CM | POA: Diagnosis not present

## 2018-11-12 DIAGNOSIS — M5136 Other intervertebral disc degeneration, lumbar region: Secondary | ICD-10-CM | POA: Diagnosis not present

## 2018-11-12 DIAGNOSIS — M546 Pain in thoracic spine: Secondary | ICD-10-CM | POA: Diagnosis not present

## 2018-11-12 NOTE — Addendum Note (Signed)
Addended by: Lendon Collar on: 11/12/2018 01:22 PM   Modules accepted: Orders

## 2018-11-12 NOTE — Progress Notes (Signed)
Office Visit Note   [name]           Date of Birth: [birth year]           MRN: [id number] Visit Date: 11/12/2018              Requested by: Carlena Hurl, PA-C [address] [place],  [place] [address] PCP: Carlena Hurl, PA-C   Assessment & Plan: Visit Diagnoses:  1. Chronic radicular lumbar pain   2. Neck pain   3. DDD (degenerative disc disease), lumbar     Plan: Diffuse degenerative changes in the cervical, thoracic and lumbar spine.  I do not see any acute changes and there is no history of present radiculopathy.  We will try over-the-counter medicines and physical therapy.  Lisa Briggs relates that she was not having any problem with any of the above areas prior to this injury.  Follow-up in 1 month.  Office visit over 40 minutes 50% of the time in counseling  Follow-Up Instructions: No follow-ups on file.   Orders:  Orders Placed This Encounter  Procedures   XR Cervical Spine 2 or 3 views   XR Thoracic Spine 2 View   XR Lumbar Spine 2-3 Views   No orders of the defined types were placed in this encounter.     Procedures: No procedures performed   Clinical Data: No additional findings.   Subjective: Chief Complaint  Patient presents with   Right Elbow - Pain    DOI 09/19/2018  Patient presents today with multiple complaints. She said that she fell after tripping on a cord on the floor on September 19, 2018 at work. She said that was trying to avoid landing on her left elbow where she has previously been treated, therefore she landed on her back and hit her right elbow. The following day she was having pain in the right side of her neck. She states that her entire spine is painful has also become painful since the fall, along with her right elbow. The right elbow is painful on the posterior side. Her pain is worse after working all day and has a very difficult time getting up the next day. She has been going to therapy as prescribed before and  they have been applying heat and using a stimulator on her back and neck. She has been taking tylenol and gabapentin.  Going to [facility] for treatment since she fell 2 months ago.  Has been using BenGay and Biofreeze.  Chief complaint essentially is neck, thoracic and low back pain without radiculopathy.  Has history of prior low back pain with MRI scan demonstrating degenerative changes in 2014.  Also has history of rheumatoid arthritis treated by. Dr. Estanislado Pandy.  HPI  Review of Systems  Constitutional: Negative for fatigue.  HENT: Negative for ear pain.   Eyes: Negative for pain.  Respiratory: Negative for shortness of breath.   Cardiovascular: Negative for leg swelling.  Gastrointestinal: Negative for constipation and diarrhea.  Endocrine: Negative for cold intolerance and heat intolerance.  Genitourinary: Negative for difficulty urinating.  Musculoskeletal: Negative for joint swelling.  Skin: Negative for rash.  Allergic/Immunologic: Negative for food allergies.  Neurological: Negative for weakness.  Hematological: Does not bruise/bleed easily.  Psychiatric/Behavioral: Positive for sleep disturbance.     Objective: Vital Signs: BP (!) 143/95    Pulse 72    Ht 5\' 9"  (1.753 m)    Wt 215 lb (97.5 kg)    LMP 02/23/2015  BMI 31.75 kg/m   Physical Exam Constitutional:      Appearance: She is well-developed.  Eyes:     Pupils: Pupils are equal, round, and reactive to light.  Pulmonary:     Effort: Pulmonary effort is normal.  Skin:    General: Skin is warm and dry.  Neurological:     Mental Status: She is alert and oriented to person, place, and time.  Psychiatric:        Behavior: Behavior normal.     Ortho Exam awake alert and oriented x3.  Comfortable sitting.  Difficult historian.  Lisa Briggs relates that she is still having trouble with her neck, thoracic and lumbar spine as a result of her on-the-job fall in June.  She initially had some radicular pain in her left leg  but no longer has any radicular pain to either upper or lower extremity.  Her neck is sore and stiff as is the thoracic and lumbar spine. Motion of her cervical spine is slow she lacks full extension by about 40 degrees could barely touch her chin to her chest and had almost full rotation to the right and to the left.  No referred pain.  Some areas of tenderness about the cervical spine.  No masses.  Has some percussible tenderness in the middle of her back but not into the flanks.  Straight leg raise negative.  Some areas of tenderness to percussion of the lumbar spine.  Painless range of motion both hips. Lisa Briggs also relates that she injured her right elbow at the time of her fall in June.  She had full flexion extension pronation supination without any localized areas of tenderness.  Good grip and good release.  She had several well-healed abrasions   Specialty Comments:  No specialty comments available.  Imaging: No results found.   PMFS History: Patient Active Problem List   Diagnosis Date Noted   Neck pain 11/12/2018   Chronic pain of right knee 09/13/2018   Bruising 09/13/2018   SOB (shortness of breath) 08/30/2018   Chronic pain of left knee 07/08/2018   Chronic hip pain 07/08/2018   Edema 07/08/2018   Left foot pain 04/08/2018   Olecranon bursitis of left elbow 04/05/2018   Leg swelling 12/05/2017   Need for shingles vaccine 12/05/2017   Primary osteoarthritis of both hands 11/23/2017   Rheumatoid factor positive 11/23/2017   Primary osteoarthritis of both knees 11/23/2017   Primary osteoarthritis of both feet 11/23/2017   DDD (degenerative disc disease), lumbar 11/23/2017   Vaccine counseling 08/13/2017   Polyarthralgia 08/13/2017   Joint stiffness 08/13/2017   Sensitive skin 01/23/2017   Need for influenza vaccination 01/23/2017   Proteinuria 01/23/2017   History of gastrointestinal stromal tumor (GIST) 04/20/2016   History of TIA (transient  ischemic attack) 04/20/2016   Screening for breast cancer 04/20/2016   Estrogen deficiency 04/20/2016   Chronic maxillary sinusitis 04/20/2016   Impaired fasting blood sugar 04/20/2016   Constipation 04/20/2016   History of fall 04/20/2016   Chronic radicular lumbar pain 01/27/2016   Encounter for health maintenance examination in adult 03/22/2015   Paresthesia 03/22/2015   Cognitive decline 03/22/2015   Screening for cervical cancer 03/22/2015   Vitamin D deficiency 03/22/2015   History of uterine leiomyoma 03/22/2015   Gastroesophageal reflux disease without esophagitis 03/02/2014   Chronic nausea 03/02/2014   Chronic abdominal pain 03/02/2014   Rhinitis, allergic 03/02/2014   Essential hypertension 03/02/2014   Hyperlipidemia 03/02/2014   Obesity with  serious comorbidity 01/16/2012   Past Medical History:  Diagnosis Date   Allergy    Anemia    iron therapy for years as of 10/12; normal Hgb 08/2013   Arthritis    Chest pain 04/05/2011   cardiac eval, normal treadmill stress test, Dr. Tollie Eth   Chronic back pain    Constipation    Farsightedness    wears glasses, Eye care center   Gastrointestinal stromal tumor (GIST) ([place]) 06/2014   Dr. [name], [facility]   GERD (gastroesophageal reflux disease)    History of uterine fibroid    Hyperlipidemia    Hypertension    Paresthesia 09/2014   initially thought to be TIA, neurology consult in 12/2014 with other non TIA considerations.     Polyarthralgia    normal rheumatoid screen 01/2012    Family History  Problem Relation Age of Onset   Hypertension Mother    Arthritis Mother    GER disease Mother    Glaucoma Mother    Breast cancer Mother    Stroke Father    Breast cancer Sister        breast cancer dx late 16s   Hypertension Sister    Colon cancer Brother 21   Diabetes Brother    Diabetes Maternal Aunt    Heart disease Maternal Aunt    Heart  disease Maternal Grandmother    Heart disease Maternal Grandfather    Heart disease Maternal Uncle    Stroke Maternal Uncle    Leukemia Sister    Hypertension Sister    Healthy Son    Colon polyps Neg Hx     Past Surgical History:  Procedure Laterality Date   COLONOSCOPY  01/2014   diverticulosis, othwerise normal - Dr. Owens Loffler   ESOPHAGOGASTRODUODENOSCOPY  2013   Dr. Benson Norway, gastritis   ESOPHAGOGASTRODUODENOSCOPY  [id number]   EUS N/A 04/16/2014   Procedure: UPPER ENDOSCOPIC ULTRASOUND (EUS) LINEAR;  Surgeon: Milus Banister, MD;  Location: [facility];  Service: Endoscopy;  Laterality: N/A;   gall stone surgery     KNEE ARTHROSCOPY Left    LAPAROSCOPIC GASTRIC RESECTION N/A 06/09/2014   Procedure: LAPAROSCOPIC GASTRIC MASS RESECTION;  Surgeon: [name], MD;  Location: [facility];  Service: General;  Laterality: N/A;   LIPOMA EXCISION     forehead   UTERINE FIBROID SURGERY     Social History   Occupational History   Occupation: IT sales professional: [employer]  Tobacco Use   Smoking status: Never Smoker   Smokeless tobacco: Never Used  Substance and Sexual Activity   Alcohol use: No    Alcohol/week: 0.0 standard drinks   Drug use: No   Sexual activity: Not on file

## 2018-11-12 NOTE — Telephone Encounter (Signed)
thanks

## 2018-11-14 DIAGNOSIS — M79605 Pain in left leg: Secondary | ICD-10-CM | POA: Diagnosis not present

## 2018-11-14 DIAGNOSIS — M79604 Pain in right leg: Secondary | ICD-10-CM | POA: Diagnosis not present

## 2018-11-14 DIAGNOSIS — M546 Pain in thoracic spine: Secondary | ICD-10-CM | POA: Diagnosis not present

## 2018-11-14 DIAGNOSIS — M545 Low back pain: Secondary | ICD-10-CM | POA: Diagnosis not present

## 2018-11-19 DIAGNOSIS — M79605 Pain in left leg: Secondary | ICD-10-CM | POA: Diagnosis not present

## 2018-11-19 DIAGNOSIS — M545 Low back pain: Secondary | ICD-10-CM | POA: Diagnosis not present

## 2018-11-19 DIAGNOSIS — M79604 Pain in right leg: Secondary | ICD-10-CM | POA: Diagnosis not present

## 2018-11-19 DIAGNOSIS — M546 Pain in thoracic spine: Secondary | ICD-10-CM | POA: Diagnosis not present

## 2018-11-21 DIAGNOSIS — M79604 Pain in right leg: Secondary | ICD-10-CM | POA: Diagnosis not present

## 2018-11-21 DIAGNOSIS — M545 Low back pain: Secondary | ICD-10-CM | POA: Diagnosis not present

## 2018-11-21 DIAGNOSIS — M546 Pain in thoracic spine: Secondary | ICD-10-CM | POA: Diagnosis not present

## 2018-11-21 DIAGNOSIS — M79605 Pain in left leg: Secondary | ICD-10-CM | POA: Diagnosis not present

## 2018-11-23 ENCOUNTER — Other Ambulatory Visit: Payer: Self-pay | Admitting: Family Medicine

## 2018-11-23 DIAGNOSIS — K2950 Unspecified chronic gastritis without bleeding: Secondary | ICD-10-CM

## 2018-11-24 ENCOUNTER — Other Ambulatory Visit: Payer: Self-pay | Admitting: Medical

## 2018-11-25 ENCOUNTER — Other Ambulatory Visit: Payer: Self-pay | Admitting: Medical

## 2018-11-25 ENCOUNTER — Other Ambulatory Visit: Payer: Self-pay

## 2018-11-25 ENCOUNTER — Telehealth: Payer: Self-pay | Admitting: Medical

## 2018-11-25 DIAGNOSIS — M79604 Pain in right leg: Secondary | ICD-10-CM | POA: Diagnosis not present

## 2018-11-25 DIAGNOSIS — K2950 Unspecified chronic gastritis without bleeding: Secondary | ICD-10-CM

## 2018-11-25 DIAGNOSIS — M545 Low back pain: Secondary | ICD-10-CM | POA: Diagnosis not present

## 2018-11-25 DIAGNOSIS — M79605 Pain in left leg: Secondary | ICD-10-CM | POA: Diagnosis not present

## 2018-11-25 DIAGNOSIS — M546 Pain in thoracic spine: Secondary | ICD-10-CM | POA: Diagnosis not present

## 2018-11-25 MED ORDER — LINACLOTIDE 145 MCG PO CAPS: 145.0000 ug | ORAL_CAPSULE | Freq: Every day | ORAL | 1 refills | Status: DC

## 2018-11-25 MED ORDER — DEXILANT 60 MG PO CPDR: 60.0000 mg | DELAYED_RELEASE_CAPSULE | Freq: Every day | ORAL | 1 refills | Status: DC

## 2018-11-25 NOTE — Telephone Encounter (Signed)
Pt called and states that she has informed us multiple times [facility]. ALL [facility]S NEED [facility].

## 2018-11-25 NOTE — Telephone Encounter (Signed)
Is this ok to refill?  

## 2018-11-25 NOTE — Therapy (Signed)
[place], [place], [address] Phone: [phone]   Fax:  [phone]  Physical Therapy Treatment/Discharge  Patient Details  Name: Lisa Briggs MRN: [id number] Date of Birth: 11-Aug-1960 Referring Provider (PT): Joni Fears, MD   Encounter Date: 06/17/2018    Past Medical History:  Diagnosis Date  . Allergy   . Anemia    iron therapy for years as of 10/12; normal Hgb 08/2013  . Arthritis   . Chest pain 04/05/2011   cardiac eval, normal treadmill stress test, Dr. Tollie Eth  . Chronic back pain   . Constipation   . Farsightedness    wears glasses, Eye care center  . Gastrointestinal stromal tumor (GIST) ([place]) 06/2014   Dr. [name], [facility]  . GERD (gastroesophageal reflux disease)   . History of uterine fibroid   . Hyperlipidemia   . Hypertension   . Paresthesia 09/2014   initially thought to be TIA, neurology consult in 12/2014 with other non TIA considerations.    . Polyarthralgia    normal rheumatoid screen 01/2012    Past Surgical History:  Procedure Laterality Date  . COLONOSCOPY  01/2014   diverticulosis, othwerise normal - Dr. Owens Loffler  . ESOPHAGOGASTRODUODENOSCOPY  2013   Dr. Benson Norway, gastritis  . ESOPHAGOGASTRODUODENOSCOPY  [id number]  . EUS N/A 04/16/2014   Procedure: UPPER ENDOSCOPIC ULTRASOUND (EUS) LINEAR;  Surgeon: Milus Banister, MD;  [facility];  Service: Endoscopy;  Laterality: N/A;  . gall stone surgery    . KNEE ARTHROSCOPY Left   . LAPAROSCOPIC GASTRIC RESECTION N/A 06/09/2014   Procedure: LAPAROSCOPIC GASTRIC MASS RESECTION;  Surgeon: [name], MD;  Location[facility];  Service: General;  Laterality: N/A;  . LIPOMA EXCISION     forehead  . UTERINE FIBROID SURGERY      There were no vitals filed for this visit.                                 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    Baseline  see flowsheet, improved but not met    Time  6    Period  Weeks    Status  On-going      PT LONG TERM GOAL #4   Title  Increase left grip strength to 20 lbs. or greater to improve ability for gripping and lifting activities for work duties    Baseline  42 lb.    Time  6    Period  Weeks    Status  On-going      PT LONG TERM GOAL #5   Title  Perform ADLs/IADLs with left elbow pain <2/10  at worst    Baseline  no pain with household ADLs but has not challenged with more involved activities or been back to work    Time  6    Period  Weeks    Status  On-going              Patient will benefit from skilled therapeutic intervention in order to improve the following deficits and impairments:  Pain, Increased edema, Impaired UE functional use, Decreased range of motion, Decreased strength  Visit Diagnosis: Pain in left elbow  Muscle weakness (generalized)  Localized edema     Problem List Patient Active Problem List   Diagnosis Date Noted  . Neck pain 11/12/2018  . Chronic pain of right knee 09/13/2018  . Bruising 09/13/2018  . SOB (shortness of breath) 08/30/2018  . Chronic pain of left knee 07/08/2018  . Chronic hip pain 07/08/2018  . Edema 07/08/2018  . Left foot pain 04/08/2018  . Olecranon bursitis of left elbow 04/05/2018  . Leg swelling 12/05/2017  . Need for shingles vaccine 12/05/2017  . Primary osteoarthritis of both hands 11/23/2017  . Rheumatoid factor positive 11/23/2017  . Primary osteoarthritis of  both knees 11/23/2017  . Primary osteoarthritis of both feet 11/23/2017  . DDD (degenerative disc disease), lumbar 11/23/2017  . Vaccine counseling 08/13/2017  . Polyarthralgia 08/13/2017  . Joint stiffness 08/13/2017  . Sensitive skin 01/23/2017  . Need for influenza vaccination 01/23/2017  . Proteinuria 01/23/2017  . History of gastrointestinal stromal tumor (GIST) 04/20/2016  . History of TIA (transient ischemic attack) 04/20/2016  . Screening for breast cancer 04/20/2016  . Estrogen deficiency 04/20/2016  . Chronic maxillary sinusitis 04/20/2016  . Impaired fasting blood sugar 04/20/2016  . Constipation 04/20/2016  . History of fall 04/20/2016  . Chronic radicular lumbar pain 01/27/2016  . Encounter for health maintenance examination in adult 03/22/2015  . Paresthesia 03/22/2015  . Cognitive decline 03/22/2015  . Screening for cervical cancer 03/22/2015  . Vitamin D deficiency 03/22/2015  . History of uterine leiomyoma 03/22/2015  . Gastroesophageal reflux disease without esophagitis 03/02/2014  . Chronic nausea 03/02/2014  . Chronic abdominal pain 03/02/2014  . Rhinitis, allergic 03/02/2014  . Essential hypertension 03/02/2014  . Hyperlipidemia 03/02/2014  . Obesity with serious comorbidity 01/16/2012     PHYSICAL THERAPY DISCHARGE SUMMARY  Visits from Start of Care: 8  Current functional level related to goals / functional outcomes: Patient did not return for further therapy after last visit 06/17/18   Remaining deficits: NA   Education / Equipment: NA Plan:                                                    Patient goals were partially met. Patient is being discharged due to not returning since the last visit.  ?????          Beaulah Dinning, PT, DPT 11/25/18 10:45 AM    [facility] [address] [place], [place], [address] Phone: [phone]   Fax:  [phone]  Name: Lisa Briggs MRN:  [id number] Date of Birth: 10-05-1960

## 2018-11-25 NOTE — Telephone Encounter (Signed)
[facility]

## 2018-11-26 DIAGNOSIS — M546 Pain in thoracic spine: Secondary | ICD-10-CM | POA: Diagnosis not present

## 2018-11-26 DIAGNOSIS — M545 Low back pain: Secondary | ICD-10-CM | POA: Diagnosis not present

## 2018-11-26 DIAGNOSIS — M79604 Pain in right leg: Secondary | ICD-10-CM | POA: Diagnosis not present

## 2018-11-26 DIAGNOSIS — M79605 Pain in left leg: Secondary | ICD-10-CM | POA: Diagnosis not present

## 2018-11-27 DIAGNOSIS — M79604 Pain in right leg: Secondary | ICD-10-CM | POA: Diagnosis not present

## 2018-11-27 DIAGNOSIS — M545 Low back pain: Secondary | ICD-10-CM | POA: Diagnosis not present

## 2018-11-27 DIAGNOSIS — M79605 Pain in left leg: Secondary | ICD-10-CM | POA: Diagnosis not present

## 2018-11-27 DIAGNOSIS — M546 Pain in thoracic spine: Secondary | ICD-10-CM | POA: Diagnosis not present

## 2018-11-29 ENCOUNTER — Other Ambulatory Visit: Payer: Self-pay | Admitting: Medical

## 2018-11-29 NOTE — Telephone Encounter (Signed)
Pt needs refills for all RX  MELOXICAM 7.5 MG HYDROCHLOROTHIAZIDE 12.5 MG ONDANSETRON 4 MG LORATADINE 10 MG LOSARTAN/HCTZ 50/12.5 MG SIMVASTATIN 20MG  POTASSIUM CHLORIDE ER DIS TABS 10 MEQ FAMOTIDINE 20 MG ASPIRIN 81 MG FLUTICASONE NASAL SPRAY 50 MCG DICLOFENAC SODIUM GEL 100 GM 1% GABAPENTIN 300 MG ALL WITH 90 DAY SUPPLY WITH 4 REFILLS PLEASE SEND TO [facility] - [place], [place]

## 2018-12-02 ENCOUNTER — Telehealth: Payer: Self-pay | Admitting: Medical

## 2018-12-02 MED ORDER — POTASSIUM CHLORIDE CRYS ER 10 MEQ PO TBCR: 10.0000 meq | EXTENDED_RELEASE_TABLET | Freq: Two times a day (BID) | ORAL | 3 refills | Status: DC

## 2018-12-02 MED ORDER — LORATADINE 10 MG PO TABS: 10.0000 mg | ORAL_TABLET | Freq: Every day | ORAL | 3 refills | Status: DC

## 2018-12-02 MED ORDER — LINACLOTIDE 145 MCG PO CAPS: 145.0000 ug | ORAL_CAPSULE | Freq: Every day | ORAL | 1 refills | Status: DC

## 2018-12-02 MED ORDER — SIMVASTATIN 20 MG PO TABS: 20.0000 mg | ORAL_TABLET | Freq: Every day | ORAL | 0 refills | Status: DC

## 2018-12-02 MED ORDER — FAMOTIDINE 20 MG PO TABS: ORAL_TABLET | ORAL | 0 refills | Status: DC

## 2018-12-02 MED ORDER — ASPIRIN EC 81 MG PO TBEC: 81.0000 mg | DELAYED_RELEASE_TABLET | Freq: Every day | ORAL | 3 refills | Status: DC

## 2018-12-02 MED ORDER — LOSARTAN POTASSIUM-HCTZ 50-12.5 MG PO TABS: 1.0000 | ORAL_TABLET | Freq: Every day | ORAL | 0 refills | Status: DC

## 2018-12-02 NOTE — Telephone Encounter (Signed)
Please see medication refill note from today.  If medications were sent to wrong pharmacy, I assume it wasn't made clear to me upon refill request where the medication was suppose to go.   She needs to make sure her other pharmacies/local pharmacy discontinues refills on the things going to [facility] so she is not getting duplicate medications  She sees both Rheumatology and Orthopedics.  They should be handling her refills on Diclofenac gel, Meloxicam, Gabapentin/Neurontin and Plaquenil.     Go ahead and send rest of refills for 90 day supply and no refill to mail order as request.  schedule her a 2-3 months med check visit or physical if due for CPX at that time.

## 2018-12-02 NOTE — Telephone Encounter (Signed)
[facility]. Please advise.

## 2018-12-03 DIAGNOSIS — M546 Pain in thoracic spine: Secondary | ICD-10-CM | POA: Diagnosis not present

## 2018-12-03 DIAGNOSIS — M79604 Pain in right leg: Secondary | ICD-10-CM | POA: Diagnosis not present

## 2018-12-03 DIAGNOSIS — M545 Low back pain: Secondary | ICD-10-CM | POA: Diagnosis not present

## 2018-12-03 DIAGNOSIS — M79605 Pain in left leg: Secondary | ICD-10-CM | POA: Diagnosis not present

## 2018-12-03 NOTE — Telephone Encounter (Signed)
Done

## 2018-12-05 ENCOUNTER — Other Ambulatory Visit: Payer: Self-pay | Admitting: Medical

## 2018-12-05 DIAGNOSIS — M545 Low back pain: Secondary | ICD-10-CM | POA: Diagnosis not present

## 2018-12-05 DIAGNOSIS — M546 Pain in thoracic spine: Secondary | ICD-10-CM | POA: Diagnosis not present

## 2018-12-05 DIAGNOSIS — M79604 Pain in right leg: Secondary | ICD-10-CM | POA: Diagnosis not present

## 2018-12-05 DIAGNOSIS — M79605 Pain in left leg: Secondary | ICD-10-CM | POA: Diagnosis not present

## 2018-12-10 DIAGNOSIS — M79605 Pain in left leg: Secondary | ICD-10-CM | POA: Diagnosis not present

## 2018-12-10 DIAGNOSIS — M546 Pain in thoracic spine: Secondary | ICD-10-CM | POA: Diagnosis not present

## 2018-12-10 DIAGNOSIS — M79604 Pain in right leg: Secondary | ICD-10-CM | POA: Diagnosis not present

## 2018-12-10 DIAGNOSIS — M545 Low back pain: Secondary | ICD-10-CM | POA: Diagnosis not present

## 2018-12-11 DIAGNOSIS — M546 Pain in thoracic spine: Secondary | ICD-10-CM | POA: Diagnosis not present

## 2018-12-11 DIAGNOSIS — M545 Low back pain: Secondary | ICD-10-CM | POA: Diagnosis not present

## 2018-12-11 DIAGNOSIS — M79604 Pain in right leg: Secondary | ICD-10-CM | POA: Diagnosis not present

## 2018-12-11 DIAGNOSIS — M79605 Pain in left leg: Secondary | ICD-10-CM | POA: Diagnosis not present

## 2018-12-12 ENCOUNTER — Encounter: Payer: Self-pay | Admitting: Orthopaedic Surgery

## 2018-12-12 ENCOUNTER — Telehealth: Payer: Self-pay | Admitting: Medical

## 2018-12-12 ENCOUNTER — Other Ambulatory Visit: Payer: Self-pay

## 2018-12-12 ENCOUNTER — Ambulatory Visit: Payer: BC Managed Care – PPO | Admitting: Orthopaedic Surgery

## 2018-12-12 ENCOUNTER — Ambulatory Visit (INDEPENDENT_AMBULATORY_CARE_PROVIDER_SITE_OTHER): Payer: BC Managed Care – PPO | Admitting: Orthopaedic Surgery

## 2018-12-12 VITALS — BP 143/93 | HR 82 | Ht 69.0 in | Wt 215.0 lb

## 2018-12-12 DIAGNOSIS — M79604 Pain in right leg: Secondary | ICD-10-CM | POA: Diagnosis not present

## 2018-12-12 DIAGNOSIS — M5416 Radiculopathy, lumbar region: Secondary | ICD-10-CM | POA: Diagnosis not present

## 2018-12-12 DIAGNOSIS — M5136 Other intervertebral disc degeneration, lumbar region: Secondary | ICD-10-CM

## 2018-12-12 DIAGNOSIS — M79605 Pain in left leg: Secondary | ICD-10-CM | POA: Diagnosis not present

## 2018-12-12 DIAGNOSIS — M546 Pain in thoracic spine: Secondary | ICD-10-CM | POA: Diagnosis not present

## 2018-12-12 DIAGNOSIS — G8929 Other chronic pain: Secondary | ICD-10-CM

## 2018-12-12 DIAGNOSIS — M545 Low back pain: Secondary | ICD-10-CM | POA: Diagnosis not present

## 2018-12-12 DIAGNOSIS — M51369 Other intervertebral disc degeneration, lumbar region without mention of lumbar back pain or lower extremity pain: Secondary | ICD-10-CM

## 2018-12-12 NOTE — Telephone Encounter (Signed)
[facility] 90 day refills on Meloxicam tabs.

## 2018-12-12 NOTE — Telephone Encounter (Signed)
Patient sees rheumatoid and ortho but we keep getting the same request for Meloxicam.

## 2018-12-12 NOTE — Telephone Encounter (Signed)
There was a message I sent about this in recent past.  Yes, lets let ortho or rheumatology refill this, not [place]

## 2018-12-12 NOTE — Progress Notes (Signed)
Office Visit Note   [name]           Date of Birth: [birth year]           MRN: [id number] Visit Date: 12/12/2018              Requested by: Lisa Briggs   Assessment & Plan: Visit Diagnoses:  1. DDD (degenerative disc disease), lumbar   2. Chronic radicular lumbar pain     Plan: Lisa Briggs continues to complain of cervical, thoracic and lumbar pain as a result of her fall at work.  Most of her pain is in the area of the lumbar spine with referred pain to both buttock and both lower extremities.  She is really having a difficult time at work and would like to stay out of work until after the MRI scan of her lumbar spine.  She is not having much trouble with either upper extremity. Will order MRI scan of her lumbar spine as she has not done well with time, medicines and therapy.  Note to stay out of work until she has been reevaluated  Follow-Up Instructions: Return After MRI lumbar spine.   Orders:  Orders Placed This Encounter  Procedures  . MR Lumbar Spine w/o contrast   No orders of the defined types were placed in this encounter.     Procedures: No procedures performed   Clinical Data: No additional findings.   Subjective: Chief Complaint  Patient presents with  . Right Elbow - Follow-up DOI 09/19/2018  . Neck - Follow-up  . Lower Back - Follow-up  Patient presents today for a one month follow up on her elbow, back, and neck. Patient states that she is going to therapy twice weekly. She is still complaining of pain. Patient states that she has good and bad days. She is wanting to be taken out of work for the rest of the year. She is taking tylenol and her regular medications.  Majority of the pain is in the lumbar spine with some referred discomfort to both buttocks and legs.  Again, she relates not having any problems prior to her on-the-job injury.  At this point  Workmen's Comp. has not excepted the injury according to Lisa Briggs  HPI  Review of Systems  Constitutional: Negative for fatigue.  HENT: Negative for ear pain.   Eyes: Negative for pain.  Respiratory: Negative for shortness of breath.   Cardiovascular: Negative for leg swelling.  Gastrointestinal: Negative for constipation and diarrhea.  Endocrine: Negative for cold intolerance and heat intolerance.  Genitourinary: Negative for difficulty urinating.  Musculoskeletal: Negative for joint swelling.  Skin: Negative for rash.  Allergic/Immunologic: Negative for food allergies.  Neurological: Negative for weakness.  Hematological: Does not bruise/bleed easily.  Psychiatric/Behavioral: Positive for sleep disturbance.     Objective: Vital Signs: BP (!) 143/93   Pulse 82   Ht 5\' 9"  (1.753 m)   Wt 215 lb (97.5 kg)   LMP 02/23/2015   BMI 31.75 kg/m   Physical Exam Constitutional:      Appearance: She is well-developed.  Eyes:     Pupils: Pupils are equal, round, and reactive to light.  Pulmonary:     Effort: Pulmonary effort is normal.  Skin:    General: Skin is warm and dry.  Neurological:     Mental Status: She is alert and oriented to person, place, and time.  Psychiatric:  Behavior: Behavior normal.     Ortho Exam awake alert and oriented x3.  Comfortable sitting.  Nearly full range of motion of cervical spine without referred discomfort to either shoulder or upper extremity.  No percussible tenderness of thoracic spine.  Straight leg raise negative.  Some areas of tenderness to percussion of the lumbar spine.  Large legs.  Motor and sensory exam appears to be intact  Specialty Comments:  No specialty comments available.  Imaging: No results found.   PMFS History: Patient Active Problem List   Diagnosis Date Noted  . Neck pain 11/12/2018  . Chronic pain of right knee 09/13/2018  . Bruising 09/13/2018  . SOB (shortness of breath) 08/30/2018  . Chronic  pain of left knee 07/08/2018  . Chronic hip pain 07/08/2018  . Edema 07/08/2018  . Left foot pain 04/08/2018  . Olecranon bursitis of left elbow 04/05/2018  . Leg swelling 12/05/2017  . Need for shingles vaccine 12/05/2017  . Primary osteoarthritis of both hands 11/23/2017  . Rheumatoid factor positive 11/23/2017  . Primary osteoarthritis of both knees 11/23/2017  . Primary osteoarthritis of both feet 11/23/2017  . DDD (degenerative disc disease), lumbar 11/23/2017  . Vaccine counseling 08/13/2017  . Polyarthralgia 08/13/2017  . Joint stiffness 08/13/2017  . Sensitive skin 01/23/2017  . Need for influenza vaccination 01/23/2017  . Proteinuria 01/23/2017  . History of gastrointestinal stromal tumor (GIST) 04/20/2016  . History of TIA (transient ischemic attack) 04/20/2016  . Screening for breast cancer 04/20/2016  . Estrogen deficiency 04/20/2016  . Chronic maxillary sinusitis 04/20/2016  . Impaired fasting blood sugar 04/20/2016  . Constipation 04/20/2016  . History of fall 04/20/2016  . Chronic radicular lumbar pain 01/27/2016  . Encounter for health maintenance examination in adult 03/22/2015  . Paresthesia 03/22/2015  . Cognitive decline 03/22/2015  . Screening for cervical cancer 03/22/2015  . Vitamin D deficiency 03/22/2015  . History of uterine leiomyoma 03/22/2015  . Gastroesophageal reflux disease without esophagitis 03/02/2014  . Chronic nausea 03/02/2014  . Chronic abdominal pain 03/02/2014  . Rhinitis, allergic 03/02/2014  . Essential hypertension 03/02/2014  . Hyperlipidemia 03/02/2014  . Obesity with serious comorbidity 01/16/2012   Past Medical History:  Diagnosis Date  . Allergy   . Anemia    iron therapy for years as of 10/12; normal Hgb 08/2013  . Arthritis   . Chest pain 04/05/2011   cardiac eval, normal treadmill stress test, Dr. Tollie Eth  . Chronic back pain   . Constipation   . Farsightedness    wears glasses, [facility] care center  .  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 [name], [facility]  . GERD (gastroesophageal reflux disease)   . History of uterine fibroid   . Hyperlipidemia   . Hypertension   . Paresthesia 09/2014   initially thought to be TIA, neurology consult in 12/2014 with other non TIA considerations.    . Polyarthralgia    normal rheumatoid screen 01/2012    Family History  Problem Relation Age of Onset  . Hypertension Mother   . Arthritis Mother   . GER disease Mother   . Glaucoma Mother   . Breast cancer Mother   . Stroke Father   . Breast cancer Sister        breast cancer dx late 78s  . Hypertension Sister   . Colon cancer Brother 59  . Diabetes Brother   . Diabetes Maternal Aunt   . Heart disease Maternal Aunt   .  Heart disease Maternal Grandmother   . Heart disease Maternal Grandfather   . Heart disease Maternal Uncle   . Stroke Maternal Uncle   . Leukemia Sister   . Hypertension Sister   . Healthy Son   . Colon polyps Neg Hx     Past Surgical History:  Procedure Laterality Date  . COLONOSCOPY  01/2014   diverticulosis, othwerise normal - Dr. Owens Loffler  . ESOPHAGOGASTRODUODENOSCOPY  2013   Dr. Benson Norway, gastritis  . ESOPHAGOGASTRODUODENOSCOPY  [id number]  . EUS N/A 04/16/2014   Procedure: UPPER ENDOSCOPIC ULTRASOUND (EUS) LINEAR;  Surgeon: Milus Banister, MD;  Location: [facility];  Service: Endoscopy;  Laterality: N/A;  . gall stone surgery    . KNEE ARTHROSCOPY Left   . LAPAROSCOPIC GASTRIC RESECTION N/A 06/09/2014   Procedure: LAPAROSCOPIC GASTRIC MASS RESECTION;  Surgeon: [name], MD;  Location: [facility];  Service: General;  Laterality: N/A;  . LIPOMA EXCISION     forehead  . UTERINE FIBROID SURGERY     Social History   Occupational History  . Occupation: IT sales professional: [employer]  Tobacco Use  . Smoking status: Never Smoker  . Smokeless tobacco: Never Used  Substance and Sexual Activity  . Alcohol use: No     Alcohol/week: 0.0 standard drinks  . Drug use: No  . Sexual activity: Not on file

## 2018-12-13 ENCOUNTER — Telehealth: Payer: Self-pay | Admitting: Medical

## 2018-12-13 NOTE — Telephone Encounter (Signed)
These medications are what you said you weren't going to refill

## 2018-12-13 NOTE — Telephone Encounter (Signed)
I feel like we just refilled all of these.  Verify that these were refilled recently.  I know I had sent message with authorization to refill to [facility] or [facility] recently.  Also, I am no longer refilling the meloxicam.  Either her orthopedist or rheumatologist should be refilling this since her arthritis is managed by a specialist.  This was the same message given at prior refill request.

## 2018-12-13 NOTE — Telephone Encounter (Signed)
Pt needs refills Meloxicam, Flonase, Venlafaxine all to [facility] for 90 days.  She states you always fill the Meloxicam and she only has 2 left and needs these and said [facility] says they have sent multiple requests

## 2018-12-16 ENCOUNTER — Other Ambulatory Visit: Payer: Self-pay | Admitting: Medical

## 2018-12-16 DIAGNOSIS — M546 Pain in thoracic spine: Secondary | ICD-10-CM | POA: Diagnosis not present

## 2018-12-16 DIAGNOSIS — M79605 Pain in left leg: Secondary | ICD-10-CM | POA: Diagnosis not present

## 2018-12-16 DIAGNOSIS — M545 Low back pain: Secondary | ICD-10-CM | POA: Diagnosis not present

## 2018-12-16 DIAGNOSIS — M79604 Pain in right leg: Secondary | ICD-10-CM | POA: Diagnosis not present

## 2018-12-16 MED ORDER — FLUTICASONE PROPIONATE 50 MCG/ACT NA SUSP: NASAL | 1 refills | Status: DC

## 2018-12-16 MED ORDER — VENLAFAXINE HCL 37.5 MG PO TABS: 37.5000 mg | ORAL_TABLET | Freq: Every day | ORAL | 1 refills | Status: DC

## 2018-12-16 NOTE — Telephone Encounter (Signed)
Looks like Flonase you refused and Venlafaxine was never refilled, she wants both to [facility]. I understand Meloxicam you want the Rheumatologist to fill but someone needs to explain that to her tomorrow at her appt please.

## 2018-12-17 ENCOUNTER — Ambulatory Visit (INDEPENDENT_AMBULATORY_CARE_PROVIDER_SITE_OTHER): Payer: BC Managed Care – PPO | Admitting: Medical

## 2018-12-17 ENCOUNTER — Other Ambulatory Visit: Payer: Self-pay

## 2018-12-17 ENCOUNTER — Encounter: Payer: Self-pay | Admitting: Medical

## 2018-12-17 VITALS — BP 122/70 | HR 72 | Temp 97.7°F | Ht 69.0 in | Wt 231.4 lb

## 2018-12-17 DIAGNOSIS — E785 Hyperlipidemia, unspecified: Secondary | ICD-10-CM

## 2018-12-17 DIAGNOSIS — Z9181 History of falling: Secondary | ICD-10-CM

## 2018-12-17 DIAGNOSIS — E559 Vitamin D deficiency, unspecified: Secondary | ICD-10-CM | POA: Diagnosis not present

## 2018-12-17 DIAGNOSIS — Z23 Encounter for immunization: Secondary | ICD-10-CM | POA: Diagnosis not present

## 2018-12-17 DIAGNOSIS — M545 Low back pain: Secondary | ICD-10-CM | POA: Diagnosis not present

## 2018-12-17 DIAGNOSIS — R109 Unspecified abdominal pain: Secondary | ICD-10-CM

## 2018-12-17 DIAGNOSIS — M19042 Primary osteoarthritis, left hand: Secondary | ICD-10-CM

## 2018-12-17 DIAGNOSIS — M19041 Primary osteoarthritis, right hand: Secondary | ICD-10-CM

## 2018-12-17 DIAGNOSIS — R609 Edema, unspecified: Secondary | ICD-10-CM

## 2018-12-17 DIAGNOSIS — G8929 Other chronic pain: Secondary | ICD-10-CM

## 2018-12-17 DIAGNOSIS — R7301 Impaired fasting glucose: Secondary | ICD-10-CM | POA: Diagnosis not present

## 2018-12-17 DIAGNOSIS — M546 Pain in thoracic spine: Secondary | ICD-10-CM | POA: Diagnosis not present

## 2018-12-17 DIAGNOSIS — K219 Gastro-esophageal reflux disease without esophagitis: Secondary | ICD-10-CM

## 2018-12-17 DIAGNOSIS — M79605 Pain in left leg: Secondary | ICD-10-CM | POA: Diagnosis not present

## 2018-12-17 DIAGNOSIS — E669 Obesity, unspecified: Secondary | ICD-10-CM

## 2018-12-17 DIAGNOSIS — Z8673 Personal history of transient ischemic attack (TIA), and cerebral infarction without residual deficits: Secondary | ICD-10-CM

## 2018-12-17 DIAGNOSIS — M19072 Primary osteoarthritis, left ankle and foot: Secondary | ICD-10-CM

## 2018-12-17 DIAGNOSIS — M79604 Pain in right leg: Secondary | ICD-10-CM | POA: Diagnosis not present

## 2018-12-17 DIAGNOSIS — M19071 Primary osteoarthritis, right ankle and foot: Secondary | ICD-10-CM

## 2018-12-17 DIAGNOSIS — M5136 Other intervertebral disc degeneration, lumbar region: Secondary | ICD-10-CM

## 2018-12-17 DIAGNOSIS — I10 Essential (primary) hypertension: Secondary | ICD-10-CM | POA: Diagnosis not present

## 2018-12-17 NOTE — Progress Notes (Signed)
Subjective: Chief Complaint  Patient presents with  . Medication Management    with fasting labs with flu shot    Here for med check  Wants flu shot today  Overall in normal state of health.  Compliant with medications, no difficulty breathing, no chest pain,.  Her main issues continue to be joint pains and arthritis.  She asked about a cane.  She fell back in June which we discussed last visit.  She currently sees rheumatology Dr. Estanislado Pandy, and she sees orthopedics Dr. Durward Fortes.   She has been out of her Plaquenil recently due to insurance and pharmacy issue.  She is taking meloxicam, topical gel.  She is considering applying for disability   Past Medical History:  Diagnosis Date  . Allergy   . Anemia    iron therapy for years as of 10/12; normal Hgb 08/2013  . Arthritis   . Chest pain 04/05/2011   cardiac eval, normal treadmill stress test, Dr. Tollie Eth  . Chronic back pain   . Constipation   . Farsightedness    wears glasses, Eye care center  . 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 [name], [facility]  . GERD (gastroesophageal reflux disease)   . History of uterine fibroid   . Hyperlipidemia   . Hypertension   . Paresthesia 09/2014   initially thought to be TIA, neurology consult in 12/2014 with other non TIA considerations.    . Polyarthralgia    normal rheumatoid screen 01/2012     Current Outpatient Medications on File Prior to Visit  Medication Sig Dispense Refill  . aspirin EC 81 MG tablet Take 1 tablet (81 mg total) by mouth daily. 90 tablet 3  . cholecalciferol (VITAMIN D3) 25 MCG (1000 UT) tablet Take 1 tablet (1,000 Units total) by mouth daily. 90 tablet 3  . cyclobenzaprine (FLEXERIL) 10 MG tablet TAKE 1 TABLET TWICE A DAY AS NEEDED FOR MUSCLE SPASMS 60 tablet 0  . dexlansoprazole (DEXILANT) 60 MG capsule Take 1 capsule (60 mg total) by mouth daily. 90 capsule 1  . diclofenac sodium (VOLTAREN) 1 % GEL APPLY 2 GRAMS  TO AFFECTED AREA 4 TIMES A DAY 300 g 3  . famotidine (PEPCID) 20 MG tablet TAKE 1 TABLET BY MOUTH EVERYDAY AT BEDTIME 90 tablet 0  . fluticasone (FLONASE) 50 MCG/ACT nasal spray USE ONE SPRAY(S) IN EACH NOSTRIL DAILY AS NEEDED FOR ALLERGIES OR RHINITIS. 48 g 1  . gabapentin (NEURONTIN) 300 MG capsule TAKE ONE TABLET BY MOUTH IN THE AFTERNOON AND 2 AT BEDTIME 270 capsule 0  . hydrochlorothiazide (HYDRODIURIL) 12.5 MG tablet Take 1 tablet (12.5 mg total) by mouth daily. 90 tablet 0  . HYDROcodone-acetaminophen (NORCO) 5-325 MG tablet Take 1 tablet by mouth every 6 (six) hours as needed for moderate pain. 12 tablet 0  . hydrocortisone (PROCTOCORT) 1 % CREA Apply 1 application topically daily. 45 g 1  . hydroxychloroquine (PLAQUENIL) 200 MG tablet TAKE 1 TABLET BY MOUTH TWICE A DAY 60 tablet 1  . linaclotide (LINZESS) 145 MCG CAPS capsule Take 1 capsule (145 mcg total) by mouth daily before breakfast. 90 capsule 1  . loratadine (CLARITIN) 10 MG tablet Take 1 tablet (10 mg total) by mouth daily. 90 tablet 3  . losartan-hydrochlorothiazide (HYZAAR) 50-12.5 MG tablet Take 1 tablet by mouth daily. 90 tablet 0  . meloxicam (MOBIC) 7.5 MG tablet Take 1 tablet (7.5 mg total) by mouth 2 (two) times a day. 180 tablet 0  .  ondansetron (ZOFRAN) 4 MG tablet TAKE ONE TABLET BY MOUTH EVERY 8 HOURS AS NEEDED FOR  NAUSEA  OR  VOMITING 90 tablet 0  . potassium chloride (K-DUR) 10 MEQ tablet Take 1 tablet (10 mEq total) by mouth 2 (two) times daily. 180 tablet 3  . simvastatin (ZOCOR) 20 MG tablet Take 1 tablet (20 mg total) by mouth daily. 90 tablet 0  . venlafaxine (EFFEXOR) 37.5 MG tablet Take 1 tablet (37.5 mg total) by mouth daily. 90 tablet 1   No current facility-administered medications on file prior to visit.    ROS as in subjective   Objective: BP 122/70   Pulse 72   Temp 97.7 F (36.5 C)   Ht 5\' 9"  (1.753 m)   Wt 231 lb 6.4 oz (105 kg)   LMP 02/23/2015   SpO2 97%   BMI 34.17 kg/m    General  appearence: alert, no distress, WD/WN,  Neck: supple, no lymphadenopathy, no thyromegaly, no masses Heart: RRR, normal S1, S2, no murmurs Lungs: CTA bilaterally, no wheezes, rhonchi, or rales Ext: no edema Pulses: 2+ symmetric, upper and lower extremities, normal cap refill    Assessment: Encounter Diagnoses  Name Primary?  . Vitamin D deficiency Yes  . Impaired fasting blood sugar   . Need for influenza vaccination   . Essential hypertension   . Gastroesophageal reflux disease without esophagitis   . Primary osteoarthritis of both hands   . Primary osteoarthritis of both feet   . DDD (degenerative disc disease), lumbar   . Class 1 obesity with serious comorbidity in adult, unspecified BMI, unspecified obesity type   . Hyperlipidemia, unspecified hyperlipidemia type   . Chronic abdominal pain   . History of TIA (transient ischemic attack)   . History of fall   . Edema, unspecified type      Plan: Vitamin D deficiency: She is compliant with vitamin D supplement-labs today  Impaired glucose-recheck labs today  Hyperlipidemia-continue current medication  I reviewed her labs from earlier this year for comprehensive metabolic, CBC, lipids.  Stable.  Continue follow-up with rheumatology and orthopedics as they manage her joint pain issues.  Chronic abdominal pain, GERD-stable on current therapy  Hypertension, edema-compliant with current medication and stable  She is up-to-date on tetanus vaccine, shingles vaccine, pneumococcal vaccine  Counseled on the influenza virus vaccine.  Vaccine information sheet given.  Influenza vaccine given after consent obtained.  Obesity-last visit she did not want to entertain any of my suggestions.  She has actually gained 16 pounds since then.  Counseled on the need to lose weight.  Recently her medications were requested to go to [facility].  We have sent these more than once, so hopefully her insurance and pharmacy issues have  gotten worked out.  Charnele was seen today for medication management.  Diagnoses and all orders for this visit:  Vitamin D deficiency -     VITAMIN D 25 Hydroxy (Vit-D Deficiency, Fractures)  Impaired fasting blood sugar -     Hemoglobin A1c  Need for influenza vaccination  Essential hypertension  Gastroesophageal reflux disease without esophagitis  Primary osteoarthritis of both hands  Primary osteoarthritis of both feet  DDD (degenerative disc disease), lumbar  Class 1 obesity with serious comorbidity in adult, unspecified BMI, unspecified obesity type  Hyperlipidemia, unspecified hyperlipidemia type  Chronic abdominal pain  History of TIA (transient ischemic attack)  History of fall  Edema, unspecified type  Other orders -     Flu Vaccine QUAD 6+  mos PF IM (Fluarix Quad PF)

## 2018-12-18 LAB — VITAMIN D 25 HYDROXY (VIT D DEFICIENCY, FRACTURES): Vit D, 25-Hydroxy: 41.7 ng/mL (ref 30.0–100.0)

## 2018-12-18 LAB — HEMOGLOBIN A1C
Est. average glucose Bld gHb Est-mCnc: 123 mg/dL
Hgb A1c MFr Bld: 5.9 % — ABNORMAL HIGH (ref 4.8–5.6)

## 2018-12-18 NOTE — Telephone Encounter (Signed)
Handled at office visit 

## 2018-12-19 DIAGNOSIS — M546 Pain in thoracic spine: Secondary | ICD-10-CM | POA: Diagnosis not present

## 2018-12-19 DIAGNOSIS — M79605 Pain in left leg: Secondary | ICD-10-CM | POA: Diagnosis not present

## 2018-12-19 DIAGNOSIS — M79604 Pain in right leg: Secondary | ICD-10-CM | POA: Diagnosis not present

## 2018-12-19 DIAGNOSIS — M545 Low back pain: Secondary | ICD-10-CM | POA: Diagnosis not present

## 2018-12-20 ENCOUNTER — Telehealth: Payer: Self-pay | Admitting: Rheumatology

## 2018-12-20 ENCOUNTER — Other Ambulatory Visit: Payer: Self-pay | Admitting: *Deleted

## 2018-12-20 DIAGNOSIS — M0579 Rheumatoid arthritis with rheumatoid factor of multiple sites without organ or systems involvement: Secondary | ICD-10-CM

## 2018-12-20 MED ORDER — HYDROXYCHLOROQUINE SULFATE 200 MG PO TABS: 200.0000 mg | ORAL_TABLET | Freq: Two times a day (BID) | ORAL | 1 refills | Status: DC

## 2018-12-20 NOTE — Telephone Encounter (Signed)
Patient called checking on her prescription of Plaquenil.  Patient states she has been out of medication for the past 3 weeks.   Patient was informed that the prescription was sent to [facility] today 12/20/18.

## 2018-12-20 NOTE — Telephone Encounter (Signed)
Patient advised prescription has been sent to the pharmacy as we had just received the refill request. Advised patient to reach out to [place] if she is running low on medication instead of waiting for the pharmacy to reach out so we can make sure she does not run out of her medication. Patient verbalized understanding.

## 2018-12-20 NOTE — Telephone Encounter (Signed)
Refill request received via fax  Last Visit: 09/04/18 Next Visit: 02/04/19 Labs: 10/08/18 stable PLQ Eye Exam: 12/18/17 WNL   Okay to refill per Dr. Estanislado Pandy

## 2018-12-22 ENCOUNTER — Other Ambulatory Visit: Payer: Self-pay

## 2018-12-22 ENCOUNTER — Ambulatory Visit
Admission: RE | Admit: 2018-12-22 | Discharge: 2018-12-22 | Disposition: A | Discharge status: Home / Self Care | Payer: BC Managed Care – PPO | Source: Ambulatory Visit | Attending: Orthopaedic Surgery | Admitting: Orthopaedic Surgery

## 2018-12-22 DIAGNOSIS — M48061 Spinal stenosis, lumbar region without neurogenic claudication: Secondary | ICD-10-CM | POA: Diagnosis not present

## 2018-12-22 DIAGNOSIS — M5136 Other intervertebral disc degeneration, lumbar region: Secondary | ICD-10-CM

## 2018-12-22 DIAGNOSIS — M51369 Other intervertebral disc degeneration, lumbar region without mention of lumbar back pain or lower extremity pain: Secondary | ICD-10-CM

## 2018-12-24 ENCOUNTER — Other Ambulatory Visit: Payer: Self-pay

## 2018-12-24 ENCOUNTER — Encounter: Payer: Self-pay | Admitting: Orthopaedic Surgery

## 2018-12-24 ENCOUNTER — Ambulatory Visit (INDEPENDENT_AMBULATORY_CARE_PROVIDER_SITE_OTHER): Payer: BC Managed Care – PPO | Admitting: Orthopaedic Surgery

## 2018-12-24 VITALS — Ht 69.0 in | Wt 231.0 lb

## 2018-12-24 DIAGNOSIS — M546 Pain in thoracic spine: Secondary | ICD-10-CM | POA: Diagnosis not present

## 2018-12-24 DIAGNOSIS — M79604 Pain in right leg: Secondary | ICD-10-CM | POA: Diagnosis not present

## 2018-12-24 DIAGNOSIS — M5136 Other intervertebral disc degeneration, lumbar region: Secondary | ICD-10-CM

## 2018-12-24 DIAGNOSIS — M545 Low back pain: Secondary | ICD-10-CM | POA: Diagnosis not present

## 2018-12-24 DIAGNOSIS — M79605 Pain in left leg: Secondary | ICD-10-CM | POA: Diagnosis not present

## 2018-12-24 NOTE — Progress Notes (Signed)
Office Visit Note   [name]           Date of Birth: 06/28/1960           MRN: [id number] Visit Date: 12/24/2018              Requested by: Carlena Hurl, PA-C [address] [place],  [place] [address] PCP: Carlena Hurl, PA-C   Assessment & Plan: Visit Diagnoses:  1. DDD (degenerative disc disease), lumbar     Plan: MRI scan of lumbar spine did not show much difference from the MRI scan in July 2018.  There was slight progression of mild to moderate bilateral neural foraminal stenosis at L2-3 with unchanged severe left lateral recess stenosis due to a subarticular disc protrusion.  There were degenerative changes throughout the lumbar spine but no central stenosis.  Long discussion over 30 minutes regarding the scan.  She did have a fall as previously mentioned that probably has aggravated her back pain.  There are no acute changes.  I have discussed treatment options including therapy, epidural steroid injection and even possibly surgical evaluation.  She has had injections in the past and not so sure that she really wants to pursue that again.  I will asked Dr. Louanne Skye to evaluate her and see what he thinks.  In the meantime we will keep her out of work  Follow-Up Instructions: Return Will refer to Dr. Louanne Skye.   Orders:  No orders of the defined types were placed in this encounter.  No orders of the defined types were placed in this encounter.     Procedures: No procedures performed   Clinical Data: No additional findings.   Subjective: Chief Complaint  Patient presents with  . Lower Back - Follow-up  Patient presents today for follow up her lower back. She had an MRI on 12/22/2018 and is here today for those results.  Continues to have pain as a result of her recent fall.  Pain is predominant in the lumbar spine with occasional discomfort to both of the legs.  HPI  Review of Systems   Objective: Vital Signs: Ht 5\' 9"  (1.753 m)   Wt 231 lb  (104.8 kg)   LMP 02/23/2015   BMI 34.11 kg/m   Physical Exam Constitutional:      Appearance: She is well-developed.  Eyes:     Pupils: Pupils are equal, round, and reactive to light.  Pulmonary:     Effort: Pulmonary effort is normal.  Skin:    General: Skin is warm and dry.  Neurological:     Mental Status: She is alert and oriented to person, place, and time.  Psychiatric:        Behavior: Behavior normal.     Ortho Exam awake alert and oriented x3.  Comfortable sitting.  Straight leg raise is consistent with mild hamstring tightness.  Neurologically intact.  Reflexes symmetrical.  Does have some percussible tenderness lumbar spine in multiple areas particularly on the left no pain with range of motion of either hip  Specialty Comments:  No specialty comments available.  Imaging: No results found.   PMFS History: Patient Active Problem List   Diagnosis Date Noted  . Neck pain 11/12/2018  . Chronic pain of right knee 09/13/2018  . Bruising 09/13/2018  . SOB (shortness of breath) 08/30/2018  . Chronic pain of left knee 07/08/2018  . Chronic hip pain 07/08/2018  . Edema 07/08/2018  . Left foot pain 04/08/2018  . Olecranon  bursitis of left elbow 04/05/2018  . Leg swelling 12/05/2017  . Need for shingles vaccine 12/05/2017  . Primary osteoarthritis of both hands 11/23/2017  . Rheumatoid factor positive 11/23/2017  . Primary osteoarthritis of both knees 11/23/2017  . Primary osteoarthritis of both feet 11/23/2017  . DDD (degenerative disc disease), lumbar 11/23/2017  . Vaccine counseling 08/13/2017  . Polyarthralgia 08/13/2017  . Joint stiffness 08/13/2017  . Sensitive skin 01/23/2017  . Need for influenza vaccination 01/23/2017  . Proteinuria 01/23/2017  . History of gastrointestinal stromal tumor (GIST) 04/20/2016  . History of TIA (transient ischemic attack) 04/20/2016  . Screening for breast cancer 04/20/2016  . Estrogen deficiency 04/20/2016  . Chronic  maxillary sinusitis 04/20/2016  . Impaired fasting blood sugar 04/20/2016  . Constipation 04/20/2016  . History of fall 04/20/2016  . Chronic radicular lumbar pain 01/27/2016  . Encounter for health maintenance examination in adult 03/22/2015  . Paresthesia 03/22/2015  . Cognitive decline 03/22/2015  . Screening for cervical cancer 03/22/2015  . Vitamin D deficiency 03/22/2015  . History of uterine leiomyoma 03/22/2015  . Gastroesophageal reflux disease without esophagitis 03/02/2014  . Chronic nausea 03/02/2014  . Chronic abdominal pain 03/02/2014  . Rhinitis, allergic 03/02/2014  . Essential hypertension 03/02/2014  . Hyperlipidemia 03/02/2014  . Obesity with serious comorbidity 01/16/2012   Past Medical History:  Diagnosis Date  . Allergy   . Anemia    iron therapy for years as of 10/12; normal Hgb 08/2013  . Arthritis   . Chest pain 04/05/2011   cardiac eval, normal treadmill stress test, Dr. Tollie Eth  . Chronic back pain   . Constipation   . Farsightedness    wears glasses, Eye care center  . 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 [name], [facility]  . GERD (gastroesophageal reflux disease)   . History of uterine fibroid   . Hyperlipidemia   . Hypertension   . Paresthesia 09/2014   initially thought to be TIA, neurology consult in 12/2014 with other non TIA considerations.    . Polyarthralgia    normal rheumatoid screen 01/2012    Family History  Problem Relation Age of Onset  . Hypertension Mother   . Arthritis Mother   . GER disease Mother   . Glaucoma Mother   . Breast cancer Mother   . Stroke Father   . Breast cancer Sister        breast cancer dx late 62s  . Hypertension Sister   . Colon cancer Brother 28  . Diabetes Brother   . Diabetes Maternal Aunt   . Heart disease Maternal Aunt   . Heart disease Maternal Grandmother   . Heart disease Maternal Grandfather   . Heart disease Maternal Uncle   . Stroke  Maternal Uncle   . Leukemia Sister   . Hypertension Sister   . Healthy Son   . Colon polyps Neg Hx     Past Surgical History:  Procedure Laterality Date  . COLONOSCOPY  01/2014   diverticulosis, othwerise normal - Dr. Owens Loffler  . ESOPHAGOGASTRODUODENOSCOPY  2013   Dr. Benson Norway, gastritis  . ESOPHAGOGASTRODUODENOSCOPY  [id number]  . EUS N/A 04/16/2014   Procedure: UPPER ENDOSCOPIC ULTRASOUND (EUS) LINEAR;  Surgeon: Milus Banister, MD;  Location: [facility];  Service: Endoscopy;  Laterality: N/A;  . gall stone surgery    . KNEE ARTHROSCOPY Left   . LAPAROSCOPIC GASTRIC RESECTION N/A 06/09/2014   Procedure: LAPAROSCOPIC GASTRIC MASS RESECTION;  Surgeon:  [name], MD;  Location: [facility];  Service: General;  Laterality: N/A;  . LIPOMA EXCISION     forehead  . UTERINE FIBROID SURGERY     Social History   Occupational History  . Occupation: IT sales professional: [employer]  Tobacco Use  . Smoking status: Never Smoker  . Smokeless tobacco: Never Used  Substance and Sexual Activity  . Alcohol use: No    Alcohol/week: 0.0 standard drinks  . Drug use: No  . Sexual activity: Not on file

## 2018-12-26 DIAGNOSIS — M546 Pain in thoracic spine: Secondary | ICD-10-CM | POA: Diagnosis not present

## 2018-12-26 DIAGNOSIS — M79605 Pain in left leg: Secondary | ICD-10-CM | POA: Diagnosis not present

## 2018-12-26 DIAGNOSIS — M545 Low back pain: Secondary | ICD-10-CM | POA: Diagnosis not present

## 2018-12-26 DIAGNOSIS — M79604 Pain in right leg: Secondary | ICD-10-CM | POA: Diagnosis not present

## 2018-12-27 DIAGNOSIS — M79604 Pain in right leg: Secondary | ICD-10-CM | POA: Diagnosis not present

## 2018-12-27 DIAGNOSIS — M546 Pain in thoracic spine: Secondary | ICD-10-CM | POA: Diagnosis not present

## 2018-12-27 DIAGNOSIS — M79605 Pain in left leg: Secondary | ICD-10-CM | POA: Diagnosis not present

## 2018-12-27 DIAGNOSIS — M545 Low back pain: Secondary | ICD-10-CM | POA: Diagnosis not present

## 2018-12-30 DIAGNOSIS — M545 Low back pain: Secondary | ICD-10-CM | POA: Diagnosis not present

## 2018-12-30 DIAGNOSIS — M79605 Pain in left leg: Secondary | ICD-10-CM | POA: Diagnosis not present

## 2018-12-30 DIAGNOSIS — M79604 Pain in right leg: Secondary | ICD-10-CM | POA: Diagnosis not present

## 2018-12-30 DIAGNOSIS — M546 Pain in thoracic spine: Secondary | ICD-10-CM | POA: Diagnosis not present

## 2019-01-02 ENCOUNTER — Other Ambulatory Visit: Payer: Self-pay | Admitting: Medical

## 2019-01-02 ENCOUNTER — Ambulatory Visit: Payer: BC Managed Care – PPO | Admitting: Orthopaedic Surgery

## 2019-01-02 DIAGNOSIS — M545 Low back pain: Secondary | ICD-10-CM | POA: Diagnosis not present

## 2019-01-02 DIAGNOSIS — M546 Pain in thoracic spine: Secondary | ICD-10-CM | POA: Diagnosis not present

## 2019-01-02 DIAGNOSIS — M79605 Pain in left leg: Secondary | ICD-10-CM | POA: Diagnosis not present

## 2019-01-02 DIAGNOSIS — M79604 Pain in right leg: Secondary | ICD-10-CM | POA: Diagnosis not present

## 2019-01-03 DIAGNOSIS — M545 Low back pain: Secondary | ICD-10-CM | POA: Diagnosis not present

## 2019-01-03 DIAGNOSIS — M546 Pain in thoracic spine: Secondary | ICD-10-CM | POA: Diagnosis not present

## 2019-01-03 DIAGNOSIS — M79604 Pain in right leg: Secondary | ICD-10-CM | POA: Diagnosis not present

## 2019-01-03 DIAGNOSIS — M79605 Pain in left leg: Secondary | ICD-10-CM | POA: Diagnosis not present

## 2019-01-06 ENCOUNTER — Encounter: Payer: Self-pay | Admitting: Gastroenterology

## 2019-01-07 DIAGNOSIS — M79604 Pain in right leg: Secondary | ICD-10-CM | POA: Diagnosis not present

## 2019-01-07 DIAGNOSIS — M79605 Pain in left leg: Secondary | ICD-10-CM | POA: Diagnosis not present

## 2019-01-07 DIAGNOSIS — M545 Low back pain: Secondary | ICD-10-CM | POA: Diagnosis not present

## 2019-01-07 DIAGNOSIS — M546 Pain in thoracic spine: Secondary | ICD-10-CM | POA: Diagnosis not present

## 2019-01-08 DIAGNOSIS — M545 Low back pain: Secondary | ICD-10-CM | POA: Diagnosis not present

## 2019-01-08 DIAGNOSIS — M546 Pain in thoracic spine: Secondary | ICD-10-CM | POA: Diagnosis not present

## 2019-01-08 DIAGNOSIS — M79605 Pain in left leg: Secondary | ICD-10-CM | POA: Diagnosis not present

## 2019-01-08 DIAGNOSIS — M79604 Pain in right leg: Secondary | ICD-10-CM | POA: Diagnosis not present

## 2019-01-09 DIAGNOSIS — M545 Low back pain: Secondary | ICD-10-CM | POA: Diagnosis not present

## 2019-01-09 DIAGNOSIS — M79604 Pain in right leg: Secondary | ICD-10-CM | POA: Diagnosis not present

## 2019-01-09 DIAGNOSIS — M546 Pain in thoracic spine: Secondary | ICD-10-CM | POA: Diagnosis not present

## 2019-01-09 DIAGNOSIS — M79605 Pain in left leg: Secondary | ICD-10-CM | POA: Diagnosis not present

## 2019-01-13 DIAGNOSIS — M79605 Pain in left leg: Secondary | ICD-10-CM | POA: Diagnosis not present

## 2019-01-13 DIAGNOSIS — M545 Low back pain: Secondary | ICD-10-CM | POA: Diagnosis not present

## 2019-01-13 DIAGNOSIS — M546 Pain in thoracic spine: Secondary | ICD-10-CM | POA: Diagnosis not present

## 2019-01-13 DIAGNOSIS — M79604 Pain in right leg: Secondary | ICD-10-CM | POA: Diagnosis not present

## 2019-01-14 ENCOUNTER — Encounter: Payer: Self-pay | Admitting: Gastroenterology

## 2019-01-15 DIAGNOSIS — M545 Low back pain: Secondary | ICD-10-CM | POA: Diagnosis not present

## 2019-01-15 DIAGNOSIS — M79604 Pain in right leg: Secondary | ICD-10-CM | POA: Diagnosis not present

## 2019-01-15 DIAGNOSIS — M546 Pain in thoracic spine: Secondary | ICD-10-CM | POA: Diagnosis not present

## 2019-01-15 DIAGNOSIS — M79605 Pain in left leg: Secondary | ICD-10-CM | POA: Diagnosis not present

## 2019-01-16 DIAGNOSIS — M79604 Pain in right leg: Secondary | ICD-10-CM | POA: Diagnosis not present

## 2019-01-16 DIAGNOSIS — M546 Pain in thoracic spine: Secondary | ICD-10-CM | POA: Diagnosis not present

## 2019-01-16 DIAGNOSIS — M79605 Pain in left leg: Secondary | ICD-10-CM | POA: Diagnosis not present

## 2019-01-16 DIAGNOSIS — M545 Low back pain: Secondary | ICD-10-CM | POA: Diagnosis not present

## 2019-01-20 DIAGNOSIS — M546 Pain in thoracic spine: Secondary | ICD-10-CM | POA: Diagnosis not present

## 2019-01-20 DIAGNOSIS — M545 Low back pain: Secondary | ICD-10-CM | POA: Diagnosis not present

## 2019-01-20 DIAGNOSIS — M79605 Pain in left leg: Secondary | ICD-10-CM | POA: Diagnosis not present

## 2019-01-20 DIAGNOSIS — M79604 Pain in right leg: Secondary | ICD-10-CM | POA: Diagnosis not present

## 2019-01-22 DIAGNOSIS — M545 Low back pain: Secondary | ICD-10-CM | POA: Diagnosis not present

## 2019-01-22 DIAGNOSIS — M79604 Pain in right leg: Secondary | ICD-10-CM | POA: Diagnosis not present

## 2019-01-22 DIAGNOSIS — M79605 Pain in left leg: Secondary | ICD-10-CM | POA: Diagnosis not present

## 2019-01-22 DIAGNOSIS — M546 Pain in thoracic spine: Secondary | ICD-10-CM | POA: Diagnosis not present

## 2019-01-22 NOTE — Progress Notes (Signed)
Office Visit Note  [name]             Date of Birth: 1960-10-28           MRN: [id number]             PCP: Carlena Hurl, PA-C Referring: Carlena Hurl, PA-C Visit Date: 02/04/2019 Occupation: @GUAROCC @  Subjective:  Generalized pain    History of Present Illness: Lisa Briggs is a 58 y.o. female with history of seropositive rheumatoid arthritis, osteoarthritis, and DDD.  Patient is taking Plaquenil 200 mg 1 tablet by mouth twice daily. She denies any recent flares. She states she continues to have pain in multiple joints and has intermittent swelling in both hands. She has generalized muscle aches and arthralgias. She has chronic pain in both knee joints. She would like to get a cane to assist with ambulation.  Activities of Daily Living:  Patient reports morning stiffness for 10 minutes.   Patient Reports nocturnal pain.  Difficulty dressing/grooming: Reports Difficulty climbing stairs: Reports Difficulty getting out of chair: Reports Difficulty using hands for taps, buttons, cutlery, and/or writing: Reports  Review of Systems  Constitutional: Positive for fatigue.  HENT: Positive for mouth dryness. Negative for mouth sores and nose dryness.   Eyes: Positive for dryness. Negative for pain and visual disturbance.  Respiratory: Negative for cough, hemoptysis, shortness of breath and difficulty breathing.   Cardiovascular: Negative for chest pain, palpitations and hypertension.  Gastrointestinal: Positive for constipation. Negative for blood in stool and diarrhea.  Endocrine: Negative for increased urination.  Genitourinary: Negative for difficulty urinating and painful urination.  Musculoskeletal: Positive for arthralgias, joint pain, joint swelling, morning stiffness and muscle tenderness. Negative for myalgias, muscle weakness and myalgias.  Skin: Negative for color change, pallor, rash, hair loss, nodules/bumps, skin tightness, ulcers and sensitivity to  sunlight.  Neurological: Negative for dizziness and headaches.  Hematological: Negative for swollen glands.  Psychiatric/Behavioral: Positive for sleep disturbance. Negative for depressed mood. The patient is not nervous/anxious.     PMFS History:  Patient Active Problem List   Diagnosis Date Noted  . Neck pain 11/12/2018  . Chronic pain of right knee 09/13/2018  . Bruising 09/13/2018  . SOB (shortness of breath) 08/30/2018  . Chronic pain of left knee 07/08/2018  . Chronic hip pain 07/08/2018  . Edema 07/08/2018  . Left foot pain 04/08/2018  . Olecranon bursitis of left elbow 04/05/2018  . Leg swelling 12/05/2017  . Need for shingles vaccine 12/05/2017  . Primary osteoarthritis of both hands 11/23/2017  . Rheumatoid factor positive 11/23/2017  . Primary osteoarthritis of both knees 11/23/2017  . Primary osteoarthritis of both feet 11/23/2017  . DDD (degenerative disc disease), lumbar 11/23/2017  . Vaccine counseling 08/13/2017  . Polyarthralgia 08/13/2017  . Joint stiffness 08/13/2017  . Sensitive skin 01/23/2017  . Need for influenza vaccination 01/23/2017  . Proteinuria 01/23/2017  . History of gastrointestinal stromal tumor (GIST) 04/20/2016  . History of TIA (transient ischemic attack) 04/20/2016  . Screening for breast cancer 04/20/2016  . Estrogen deficiency 04/20/2016  . Chronic maxillary sinusitis 04/20/2016  . Impaired fasting blood sugar 04/20/2016  . Constipation 04/20/2016  . History of fall 04/20/2016  . Chronic radicular lumbar pain 01/27/2016  . Encounter for health maintenance examination in adult 03/22/2015  . Paresthesia 03/22/2015  . Cognitive decline 03/22/2015  . Screening for cervical cancer 03/22/2015  . Vitamin D deficiency 03/22/2015  . History of uterine leiomyoma 03/22/2015  . Gastroesophageal  reflux disease without esophagitis 03/02/2014  . Chronic nausea 03/02/2014  . Chronic abdominal pain 03/02/2014  . Rhinitis, allergic 03/02/2014  .  Essential hypertension 03/02/2014  . Hyperlipidemia 03/02/2014  . Obesity with serious comorbidity 01/16/2012    Past Medical History:  Diagnosis Date  . Allergy   . Anemia    iron therapy for years as of 10/12; normal Hgb 08/2013  . Arthritis   . Chest pain 04/05/2011   cardiac eval, normal treadmill stress test, Dr. Tollie Eth  . Chronic back pain   . Constipation   . Farsightedness    wears glasses, Eye care center  . 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 [name], [facility]  . GERD (gastroesophageal reflux disease)   . History of uterine fibroid   . Hyperlipidemia   . Hypertension   . Paresthesia 09/2014   initially thought to be TIA, neurology consult in 12/2014 with other non TIA considerations.    . Polyarthralgia    normal rheumatoid screen 01/2012    Family History  Problem Relation Age of Onset  . Hypertension Mother   . Arthritis Mother   . GER disease Mother   . Glaucoma Mother   . Breast cancer Mother   . Stroke Father   . Breast cancer Sister        breast cancer dx late 39s  . Hypertension Sister   . Colon cancer Brother 11  . Diabetes Brother   . Diabetes Maternal Aunt   . Heart disease Maternal Aunt   . Heart disease Maternal Grandmother   . Heart disease Maternal Grandfather   . Heart disease Maternal Uncle   . Stroke Maternal Uncle   . Leukemia Sister   . Hypertension Sister   . Healthy Son   . Colon polyps Neg Hx   . Esophageal cancer Neg Hx   . Stomach cancer Neg Hx   . Rectal cancer Neg Hx    Past Surgical History:  Procedure Laterality Date  . COLONOSCOPY  01/2014   diverticulosis, othwerise normal - Dr. Owens Loffler  . ESOPHAGOGASTRODUODENOSCOPY  2013   Dr. Benson Norway, gastritis  . ESOPHAGOGASTRODUODENOSCOPY  [id number]  . EUS N/A 04/16/2014   Procedure: UPPER ENDOSCOPIC ULTRASOUND (EUS) LINEAR;  Surgeon: Milus Banister, MD;  Location: [facility];  Service: Endoscopy;  Laterality: N/A;  . gall  stone surgery    . KNEE ARTHROSCOPY Left   . LAPAROSCOPIC GASTRIC RESECTION N/A 06/09/2014   Procedure: LAPAROSCOPIC GASTRIC MASS RESECTION;  Surgeon: [name], MD;  Location: [facility];  Service: General;  Laterality: N/A;  . LIPOMA EXCISION     forehead  . UTERINE FIBROID SURGERY     Social History   Social History Narrative   Married, has 1 son in [place] and some grandchildren.  Glass blower/designer.  Active on job.  Does stretching and exercises daily as per physical therapy.  Works 12 hours daily.     Immunization History  Administered Date(s) Administered  . Influenza,inj,Quad PF,6+ Mos 11/24/2013, 01/18/2015, 01/23/2017, 12/05/2017, 12/17/2018  . Pneumococcal Polysaccharide-23 05/21/2015  . Tdap 01/17/2011  . Zoster Recombinat (Shingrix) 10/05/2017, 12/07/2017     Objective: Vital Signs: BP 113/70 (BP Location: Left Arm, Patient Position: Sitting, Cuff Size: Large)   Pulse 86   Resp 18   Ht 5\' 9"  (1.753 m)   Wt 241 lb 3.2 oz (109.4 kg)   LMP 02/23/2015   BMI 35.62 kg/m    Physical Exam Vitals signs  and nursing note reviewed.  Constitutional:      Appearance: She is well-developed.  HENT:     Head: Normocephalic and atraumatic.  Eyes:     Conjunctiva/sclera: Conjunctivae normal.  Neck:     Musculoskeletal: Normal range of motion.  Cardiovascular:     Rate and Rhythm: Normal rate and regular rhythm.     Heart sounds: Normal heart sounds.  Pulmonary:     Effort: Pulmonary effort is normal.     Breath sounds: Normal breath sounds.  Abdominal:     General: Bowel sounds are normal.     Palpations: Abdomen is soft.  Lymphadenopathy:     Cervical: No cervical adenopathy.  Skin:    General: Skin is warm and dry.     Capillary Refill: Capillary refill takes less than 2 seconds.  Neurological:     Mental Status: She is alert and oriented to person, place, and time.  Psychiatric:        Behavior: Behavior normal.      Musculoskeletal Exam: Generalized  hyperalgesia on exam.  Shoulder joints, elbow joints, wrist joints, MCPs, PIPs, DIPs good range of motion with no synovitis.  She has PIP and DIP synovial thickening consistent with osteoarthritis of both hands.  Hip joints, knee joints, ankle joints, MTPs, PIPs, DIPs good range of motion no synovitis.  No warmth or effusion of bilateral knee joints.  No tenderness or swelling of ankle joints.  She has tenderness over bilateral trochanteric bursa.  CDAI Exam: CDAI Score: 1.2  Patient Global: 10 mm; Provider Global: 2 mm Swollen: 0 ; Tender: 0  Joint Exam   No joint exam has been documented for this visit   There is currently no information documented on the homunculus. Go to the Rheumatology activity and complete the homunculus joint exam.  Investigation: No additional findings.  Imaging: No results found.  Recent Labs: Lab Results  Component Value Date   WBC 5.6 10/08/2018   HGB 12.9 10/08/2018   PLT 347 10/08/2018   NA 142 10/08/2018   K 4.2 10/08/2018   CL 104 10/08/2018   CO2 31 10/08/2018   GLUCOSE 93 10/08/2018   BUN 19 10/08/2018   CREATININE 0.75 10/08/2018   BILITOT 0.4 10/08/2018   ALKPHOS 96 04/20/2016   AST 18 10/08/2018   ALT 16 10/08/2018   PROT 7.1 10/08/2018   ALBUMIN 3.7 04/20/2016   CALCIUM 9.8 10/08/2018   GFRAA 103 10/08/2018    Speciality Comments: PLQ Eye Exam: 12/18/17 W[facility] @ [facility] Follow up in 1 year  Procedures:  No procedures performed Allergies: Kiwi extract, Mucinex dm [dm-guaifenesin er], and Peach [prunus persica]   Assessment / Plan:     Visit Diagnoses: Rheumatoid arthritis involving multiple sites with positive rheumatoid factor ([place]) - +RF, +CCP: She has no synovitis on exam.  She has not had any recent rheumatoid arthritis flares.  She is clinically doing well on Plaquenil 200 mg 1 tablet by mouth twice daily.  She experiences generalized arthralgias and myalgias on a regular basis.  She currently rates her pain a  10 out of 10.  Her rheumatoid arthritis seems well controlled on the current treatment regimen.  A refill Plaquenil sent to the pharmacy today.  She will follow-up in the office in 5 months.- Plan: hydroxychloroquine (PLAQUENIL) 200 MG tablet  High risk medication use - Plaquenil 200 mg 1 tablet twice daily.   Last Plaquenil eye exam normal on 12/18/2017.  She was given a  PLQ eye exam form to take with her to her upcoming appointment. Most recent CBC/CMP within normal limits on 10/08/2018 and will monitor every 5 months.  She will return for lab work in December 2020.    Primary osteoarthritis of both hands: She has PIP and DIP synovial thickening consistent osteoarthritis of both hands.  She has complete fist formation bilaterally.  No synovitis was noted.  Joint protection and muscle strengthening were discussed.  Primary osteoarthritis of both knees: She has chronic pain in both knee joints.  No warmth or effusion was noted.  She requested a prescription for a walking cane to assist with ambulation.  Primary osteoarthritis of both feet: She has chronic pain in both feet.  She wears proper fitting shoes.  DDD (degenerative disc disease), lumbar: Chronic pain   Septic olecranon bursitis of left elbow - Resolved.   Other medical conditions are listed as follows:   Gastroesophageal reflux disease without esophagitis  Essential hypertension  Vitamin D deficiency  History of TIA (transient ischemic attack)   Orders: No orders of the defined types were placed in this encounter.  Meds ordered this encounter  Medications  . hydroxychloroquine (PLAQUENIL) 200 MG tablet    Sig: Take 1 tablet (200 mg total) by mouth 2 (two) times daily.    Dispense:  180 tablet    Refill:  0    Rheumatoid Arthritis      Follow-Up Instructions: Return in about 5 months (around 07/05/2019) for Rheumatoid arthritis, Osteoarthritis, DDD.   Ofilia Neas, PA-C  Note - This record has been created using  Dragon software.  Chart creation errors have been sought, but may not always  have been located. Such creation errors do not reflect on  the standard of medical care.

## 2019-01-23 DIAGNOSIS — M79604 Pain in right leg: Secondary | ICD-10-CM | POA: Diagnosis not present

## 2019-01-23 DIAGNOSIS — M79605 Pain in left leg: Secondary | ICD-10-CM | POA: Diagnosis not present

## 2019-01-23 DIAGNOSIS — M545 Low back pain: Secondary | ICD-10-CM | POA: Diagnosis not present

## 2019-01-23 DIAGNOSIS — M546 Pain in thoracic spine: Secondary | ICD-10-CM | POA: Diagnosis not present

## 2019-01-27 ENCOUNTER — Other Ambulatory Visit: Payer: Self-pay | Admitting: Medical

## 2019-01-27 DIAGNOSIS — M545 Low back pain: Secondary | ICD-10-CM | POA: Diagnosis not present

## 2019-01-27 DIAGNOSIS — M79605 Pain in left leg: Secondary | ICD-10-CM | POA: Diagnosis not present

## 2019-01-27 DIAGNOSIS — M79604 Pain in right leg: Secondary | ICD-10-CM | POA: Diagnosis not present

## 2019-01-27 DIAGNOSIS — M546 Pain in thoracic spine: Secondary | ICD-10-CM | POA: Diagnosis not present

## 2019-01-28 ENCOUNTER — Encounter: Payer: Self-pay | Admitting: Gastroenterology

## 2019-01-28 ENCOUNTER — Ambulatory Visit (AMBULATORY_SURGERY_CENTER): Payer: BC Managed Care – PPO | Admitting: *Deleted

## 2019-01-28 ENCOUNTER — Other Ambulatory Visit: Payer: Self-pay

## 2019-01-28 VITALS — Temp 97.1°F | Ht 69.0 in | Wt 237.6 lb

## 2019-01-28 DIAGNOSIS — Z1159 Encounter for screening for other viral diseases: Secondary | ICD-10-CM

## 2019-01-28 DIAGNOSIS — Z800 Family history of malignant neoplasm of digestive organs: Secondary | ICD-10-CM

## 2019-01-28 NOTE — Progress Notes (Signed)

## 2019-01-29 DIAGNOSIS — M79605 Pain in left leg: Secondary | ICD-10-CM | POA: Diagnosis not present

## 2019-01-29 DIAGNOSIS — M79604 Pain in right leg: Secondary | ICD-10-CM | POA: Diagnosis not present

## 2019-01-29 DIAGNOSIS — M545 Low back pain: Secondary | ICD-10-CM | POA: Diagnosis not present

## 2019-01-29 DIAGNOSIS — M546 Pain in thoracic spine: Secondary | ICD-10-CM | POA: Diagnosis not present

## 2019-01-31 ENCOUNTER — Ambulatory Visit (INDEPENDENT_AMBULATORY_CARE_PROVIDER_SITE_OTHER): Payer: BC Managed Care – PPO | Admitting: Specialist

## 2019-01-31 ENCOUNTER — Ambulatory Visit: Payer: Self-pay

## 2019-01-31 ENCOUNTER — Other Ambulatory Visit: Payer: Self-pay

## 2019-01-31 ENCOUNTER — Encounter: Payer: Self-pay | Admitting: Specialist

## 2019-01-31 VITALS — BP 122/81 | HR 89 | Ht 69.0 in | Wt 231.0 lb

## 2019-01-31 DIAGNOSIS — M4726 Other spondylosis with radiculopathy, lumbar region: Secondary | ICD-10-CM | POA: Diagnosis not present

## 2019-01-31 DIAGNOSIS — M5136 Other intervertebral disc degeneration, lumbar region: Secondary | ICD-10-CM | POA: Diagnosis not present

## 2019-01-31 DIAGNOSIS — M48062 Spinal stenosis, lumbar region with neurogenic claudication: Secondary | ICD-10-CM | POA: Diagnosis not present

## 2019-01-31 DIAGNOSIS — G5603 Carpal tunnel syndrome, bilateral upper limbs: Secondary | ICD-10-CM

## 2019-01-31 DIAGNOSIS — M51369 Other intervertebral disc degeneration, lumbar region without mention of lumbar back pain or lower extremity pain: Secondary | ICD-10-CM

## 2019-01-31 MED ORDER — GABAPENTIN 300 MG PO CAPS: 300.0000 mg | ORAL_CAPSULE | Freq: Three times a day (TID) | ORAL | 3 refills | Status: DC

## 2019-01-31 MED ORDER — TRAMADOL-ACETAMINOPHEN 37.5-325 MG PO TABS: 1.0000 | ORAL_TABLET | Freq: Four times a day (QID) | ORAL | 0 refills | Status: DC | PRN

## 2019-01-31 NOTE — Patient Instructions (Addendum)
Avoid bending, stooping and avoid lifting weights greater than 10 lbs. Avoid prolong standing and walking. Avoid frequent bending and stooping  No lifting greater than 10 lbs. May use ice or moist heat for pain. Weight loss is of benefit. Handicap license is approved. Dr. Romona Curls secretary/Assistant will call to arrange for epidural steroid injection  Stationary bike and pool exercises are helpful.

## 2019-01-31 NOTE — Progress Notes (Signed)
Office Visit Note   [name]           Date of Birth: 1960-08-16           MRN: [id number] Visit Date: 01/31/2019              Requested by: Carlena Hurl, PA-C [address] [place],  [place] [address] PCP: Carlena Hurl, PA-C   Assessment & Plan: Visit Diagnoses:  1. DDD (degenerative disc disease), lumbar   2. Degenerative disc disease, lumbar   3. Spinal stenosis of lumbar region with neurogenic claudication   4. Other spondylosis with radiculopathy, lumbar region   5. 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 She fell while working 09/19/2018, falling backwards landing on her back and right elbow. Dr. Durward Fortes saw her for her right elbow and also initially evaluated her lumbar condition. She has right neck and trapezius pain, right thoracic pain and complaints of a worsened back condition. Pain in the lower lumbar spine with radiation into the posterior and lateral thighs and legs. Clinically there there is no neural tension signs to suggest acute nerve compression, she has a pattern of neurogenic claudication that affects her ability to stand and walk.   Plan: Avoid bending, stooping and avoid lifting weights greater than 10 lbs. Avoid prolong standing and walking. Avoid frequent bending and stooping  No lifting greater than 10 lbs. May use ice or moist heat for pain. Weight loss is of benefit. Handicap license is approved. Dr. Romona Curls secretary/Assistant will call to arrange for epidural steroid injection  Stationary bike and pool exercises are helpful.   Follow-Up Instructions: Return in about 4 weeks (around 02/28/2019).   Orders:  Orders Placed This Encounter  Procedures   XR Lumb Spine Flex&Ext Only   No orders of the defined types were placed in this encounter.     Procedures: No procedures performed   Clinical Data: No additional  findings.   Subjective: Chief Complaint  Patient presents with   Lower Back - Pain, Injury    Fell at work on 09/19/2018 when her foot was caught in a cord and she fell flat on her back    58 year old female with past history of lumbago and sciatica previous MRI 2014, has seen Dr. Posey Pronto neurology in the past. Also a history of rheumatoid arthritis with a positive  Rheumatoid Factor, saw Dr. Estanislado Pandy in the past and was started on plaquenil and no other meds. She reports that she had previous back catch in the past with getting up and there was some stiffness. An pain in the legs and toes but that stopped when seen by another MD Dr. Harriette Bouillon with [facility] in the past, last seen when she left there about. She is on gabapentin chronically since 2017 for back and bilateral leg pain from Dr. Posey Pronto. She relates that her back pain was improved before the more recent injury that she relates to the recent worsening of her back and Legs down the backs and sides of the legs. This right here is worsen than it was before. Accident At work 09/19/2018, she was working her job with a Forensic psychologist. She relates that she was sweeping and pushing and cleaning an area pushing a computer back and was cleaning under some wires. As she was backing out she caught her left foot on some of the  Wires and she fell backwards onto the floor. She landed flat on her back and reports that she hit her right elbow and landed directly on her back. It happened so fast and she was dazzled by the fall. She stayed after the fall, not seen by Engineer, building services. The health and safety man Tim helped her, gave her some Biofreeze and some tablets there, she reported that she told him that she had something she could take. She reports then the workman's compensation doctor saw her and recommended for return to light duty. She reports that Physical therapy that she had had from a previous injury was restarted. She reports  that workman's compensation denied the  Back injury. She has been going to [facility], twice week for the last 2 1/2 months for this injury. Had been seen at [facility]. Since before the [place] 19 pandemic occurred. With the same fall, Dr. Virgil Benedict had her in PT at [facility]. And with the [place] 19 Went to [facility] due to the restriction on treatment at the [facility]. PT facility. She has back pain with lifting and bending and stooping. She reports at the grocery she does no lifting her husband helps with the heavier stuff. She has to sit in higher chairs and used shower chair. Has a higher toilet seat at her home. She has to hold onto some thing to push her self up to standing. No bowel or bladder difficulty, takes linzess for this tried on this by Oren Beckmann, [facility] practice.  Hydrocodone she has been taking one a day since 09/2018, she has only been given #12 tablets. Left elbow injury not work related in 04/2018. Right elbow injury occurred with the recent fall 09/2018. She has had a history of previous work related injury left leg and right knee in the past in 2017-18. She had an injury to the left leg when a box plastic fell off and landed on the left leg and she was treated at work with rub downs and wraps.  She is not working and stopped working 11/08/2018, she stopped at that time 11/12/2018 and took a letter from Dr.Whitfield to her employer. She has been employed with this company for nearly nine years. She reports night pain with radiation into the legs right leg greater than left. Has pain in her upper back. She has to get Up to walk when her leg is painful, she has to hang the leg out of the bed. Walking to try and improve the pain. Stretches the right leg to improve the pain.    Review of Systems  Constitutional: Negative.  Negative for activity change, appetite change, chills, diaphoresis, fatigue, fever and unexpected weight change.  HENT: Positive for tinnitus.  Negative for congestion, dental problem, drooling, ear discharge, ear pain, facial swelling, hearing loss, mouth sores, nosebleeds, postnasal drip, rhinorrhea, sinus pressure, sinus pain, sneezing, sore throat, trouble swallowing and voice change.   Eyes: Negative.  Negative for photophobia, pain, discharge, redness, itching and visual disturbance.  Respiratory: Negative.  Negative for apnea, cough, choking, chest tightness, shortness of breath, wheezing and stridor.   Cardiovascular: Positive for leg swelling. Negative for chest pain and palpitations.  Gastrointestinal: Negative.  Negative for abdominal distention, abdominal pain, anal bleeding, blood in stool, constipation, diarrhea, nausea, rectal pain and vomiting.  Endocrine: Negative for cold intolerance, heat intolerance, polydipsia and polyuria.  Genitourinary: Negative.  Negative for difficulty urinating, dyspareunia, dysuria, enuresis, flank pain, frequency, genital sores, hematuria, pelvic  pain and urgency.  Musculoskeletal: Positive for back pain, gait problem, joint swelling, neck pain and neck stiffness. Negative for arthralgias and myalgias.  Skin: Negative.  Negative for color change, pallor, rash and wound.  Allergic/Immunologic: Negative.  Negative for environmental allergies, food allergies and immunocompromised state.  Neurological: Positive for weakness and numbness. Negative for tremors, seizures, syncope and speech difficulty.  Hematological: Negative.  Negative for adenopathy. Does not bruise/bleed easily.  Psychiatric/Behavioral: Negative for agitation, behavioral problems, confusion, decreased concentration, dysphoric mood, hallucinations, self-injury, sleep disturbance and suicidal ideas. The patient is not nervous/anxious and is not hyperactive.      Objective: Vital Signs: BP 122/81 (BP Location: Left Arm, Patient Position: Sitting)    Pulse 89    Ht 5\' 9"  (1.753 m)    Wt 231 lb (104.8 kg)    LMP 02/23/2015    BMI 34.11  kg/m   Physical Exam Constitutional:      Appearance: She is well-developed.  HENT:     Head: Normocephalic and atraumatic.  Eyes:     Pupils: Pupils are equal, round, and reactive to light.  Neck:     Musculoskeletal: Normal range of motion and neck supple.  Pulmonary:     Effort: Pulmonary effort is normal.     Breath sounds: Normal breath sounds.  Abdominal:     General: Bowel sounds are normal.     Palpations: Abdomen is soft.  Skin:    General: Skin is warm and dry.  Neurological:     Mental Status: She is alert and oriented to person, place, and time.  Psychiatric:        Behavior: Behavior normal.        Thought Content: Thought content normal.        Judgment: Judgment normal.     Back Exam   Tenderness  The patient is experiencing tenderness in the lumbar and cervical.  Range of Motion  Extension:  60 abnormal  Flexion: 70  Lateral bend right:  60 abnormal  Lateral bend left: 70  Rotation right: 60  Rotation left: 70   Tests  Straight leg raise right: negative Straight leg raise left: negative  Reflexes  Patellar: 1/4 Achilles: 1/4 Biceps: 2/4 Babinski's sign: normal   Other  Toe walk: normal Heel walk: normal Sensation: normal Gait: normal  Erythema: no back redness Scars: absent      Specialty Comments:  No specialty comments available.  Imaging: No results found.   PMFS History: Patient Active Problem List   Diagnosis Date Noted   Neck pain 11/12/2018   Chronic pain of right knee 09/13/2018   Bruising 09/13/2018   SOB (shortness of breath) 08/30/2018   Chronic pain of left knee 07/08/2018   Chronic hip pain 07/08/2018   Edema 07/08/2018   Left foot pain 04/08/2018   Olecranon bursitis of left elbow 04/05/2018   Leg swelling 12/05/2017   Need for shingles vaccine 12/05/2017   Primary osteoarthritis of both hands 11/23/2017   Rheumatoid factor positive 11/23/2017   Primary osteoarthritis of both knees  11/23/2017   Primary osteoarthritis of both feet 11/23/2017   DDD (degenerative disc disease), lumbar 11/23/2017   Vaccine counseling 08/13/2017   Polyarthralgia 08/13/2017   Joint stiffness 08/13/2017   Sensitive skin 01/23/2017   Need for influenza vaccination 01/23/2017   Proteinuria 01/23/2017   History of gastrointestinal stromal tumor (GIST) 04/20/2016   History of TIA (transient ischemic attack) 04/20/2016   Screening for breast cancer 04/20/2016  Estrogen deficiency 04/20/2016   Chronic maxillary sinusitis 04/20/2016   Impaired fasting blood sugar 04/20/2016   Constipation 04/20/2016   History of fall 04/20/2016   Chronic radicular lumbar pain 01/27/2016   Encounter for health maintenance examination in adult 03/22/2015   Paresthesia 03/22/2015   Cognitive decline 03/22/2015   Screening for cervical cancer 03/22/2015   Vitamin D deficiency 03/22/2015   History of uterine leiomyoma 03/22/2015   Gastroesophageal reflux disease without esophagitis 03/02/2014   Chronic nausea 03/02/2014   Chronic abdominal pain 03/02/2014   Rhinitis, allergic 03/02/2014   Essential hypertension 03/02/2014   Hyperlipidemia 03/02/2014   Obesity with serious comorbidity 01/16/2012   Past Medical History:  Diagnosis Date   Allergy    Anemia    iron therapy for years as of 10/12; normal Hgb 08/2013   Arthritis    Chest pain 04/05/2011   cardiac eval, normal treadmill stress test, Dr. Tollie Eth   Chronic back pain    Constipation    Farsightedness    wears glasses, Eye care center   Gastrointestinal stromal tumor (GIST) ([place]) 06/2014   Dr. [name], [facility]   GERD (gastroesophageal reflux disease)    History of uterine fibroid    Hyperlipidemia    Hypertension    Paresthesia 09/2014   initially thought to be TIA, neurology consult in 12/2014 with other non TIA considerations.     Polyarthralgia    normal rheumatoid  screen 01/2012    Family History  Problem Relation Age of Onset   Hypertension Mother    Arthritis Mother    GER disease Mother    Glaucoma Mother    Breast cancer Mother    Stroke Father    Breast cancer Sister        breast cancer dx late 81s   Hypertension Sister    Colon cancer Brother 59   Diabetes Brother    Diabetes Maternal Aunt    Heart disease Maternal Aunt    Heart disease Maternal Grandmother    Heart disease Maternal Grandfather    Heart disease Maternal Uncle    Stroke Maternal Uncle    Leukemia Sister    Hypertension Sister    Healthy Son    Colon polyps Neg Hx    Esophageal cancer Neg Hx    Stomach cancer Neg Hx    Rectal cancer Neg Hx     Past Surgical History:  Procedure Laterality Date   COLONOSCOPY  01/2014   diverticulosis, othwerise normal - Dr. Owens Loffler   ESOPHAGOGASTRODUODENOSCOPY  2013   Dr. Benson Norway, gastritis   ESOPHAGOGASTRODUODENOSCOPY  [id number]   EUS N/A 04/16/2014   Procedure: UPPER ENDOSCOPIC ULTRASOUND (EUS) LINEAR;  Surgeon: Milus Banister, MD;  Location: [facility];  Service: Endoscopy;  Laterality: N/A;   gall stone surgery     KNEE ARTHROSCOPY Left    LAPAROSCOPIC GASTRIC RESECTION N/A 06/09/2014   Procedure: LAPAROSCOPIC GASTRIC MASS RESECTION;  Surgeon: [name], MD;  Location: [facility];  Service: General;  Laterality: N/A;   LIPOMA EXCISION     forehead   UTERINE FIBROID SURGERY     Social History   Occupational History   Occupation: IT sales professional: [employer]  Tobacco Use   Smoking status: Never Smoker   Smokeless tobacco: Never Used  Substance and Sexual Activity   Alcohol use: No    Alcohol/week: 0.0 standard drinks   Drug use: No   Sexual activity: Not on file

## 2019-02-04 ENCOUNTER — Telehealth: Payer: Self-pay | Admitting: Radiology

## 2019-02-04 ENCOUNTER — Telehealth: Payer: Self-pay

## 2019-02-04 ENCOUNTER — Other Ambulatory Visit: Payer: Self-pay

## 2019-02-04 ENCOUNTER — Ambulatory Visit: Payer: BLUE CROSS/BLUE SHIELD | Admitting: Physician Assistant

## 2019-02-04 ENCOUNTER — Telehealth: Payer: Self-pay | Admitting: Rheumatology

## 2019-02-04 ENCOUNTER — Encounter: Payer: Self-pay | Admitting: Physician Assistant

## 2019-02-04 VITALS — BP 113/70 | HR 86 | Resp 18 | Ht 69.0 in | Wt 241.2 lb

## 2019-02-04 DIAGNOSIS — M170 Bilateral primary osteoarthritis of knee: Secondary | ICD-10-CM

## 2019-02-04 DIAGNOSIS — M19042 Primary osteoarthritis, left hand: Secondary | ICD-10-CM

## 2019-02-04 DIAGNOSIS — M71122 Other infective bursitis, left elbow: Secondary | ICD-10-CM

## 2019-02-04 DIAGNOSIS — M19072 Primary osteoarthritis, left ankle and foot: Secondary | ICD-10-CM

## 2019-02-04 DIAGNOSIS — M19071 Primary osteoarthritis, right ankle and foot: Secondary | ICD-10-CM

## 2019-02-04 DIAGNOSIS — Z8673 Personal history of transient ischemic attack (TIA), and cerebral infarction without residual deficits: Secondary | ICD-10-CM

## 2019-02-04 DIAGNOSIS — Z79899 Other long term (current) drug therapy: Secondary | ICD-10-CM | POA: Diagnosis not present

## 2019-02-04 DIAGNOSIS — K219 Gastro-esophageal reflux disease without esophagitis: Secondary | ICD-10-CM

## 2019-02-04 DIAGNOSIS — M19041 Primary osteoarthritis, right hand: Secondary | ICD-10-CM | POA: Diagnosis not present

## 2019-02-04 DIAGNOSIS — E559 Vitamin D deficiency, unspecified: Secondary | ICD-10-CM

## 2019-02-04 DIAGNOSIS — M0579 Rheumatoid arthritis with rheumatoid factor of multiple sites without organ or systems involvement: Secondary | ICD-10-CM

## 2019-02-04 DIAGNOSIS — I10 Essential (primary) hypertension: Secondary | ICD-10-CM

## 2019-02-04 DIAGNOSIS — M5136 Other intervertebral disc degeneration, lumbar region: Secondary | ICD-10-CM

## 2019-02-04 DIAGNOSIS — M51369 Other intervertebral disc degeneration, lumbar region without mention of lumbar back pain or lower extremity pain: Secondary | ICD-10-CM

## 2019-02-04 MED ORDER — HYDROXYCHLOROQUINE SULFATE 200 MG PO TABS: 200.0000 mg | ORAL_TABLET | Freq: Two times a day (BID) | ORAL | 0 refills | Status: DC

## 2019-02-04 MED ORDER — HYDROCHLOROTHIAZIDE 12.5 MG PO TABS: 12.5000 mg | ORAL_TABLET | Freq: Every day | ORAL | 3 refills | Status: DC

## 2019-02-04 MED ORDER — HYDROCHLOROTHIAZIDE 12.5 MG PO TABS: 12.5000 mg | ORAL_TABLET | Freq: Every day | ORAL | 0 refills | Status: DC

## 2019-02-04 NOTE — Telephone Encounter (Signed)
Patient came by to check on calls that has been made to her. I advised that she has been contacted to schedule an appt.  Please call her at [phone] this is her cell phone number.

## 2019-02-04 NOTE — Telephone Encounter (Signed)
30 days was sent to local pharmacy. 90 day with [facility]

## 2019-02-04 NOTE — Patient Instructions (Signed)
Standing Labs We placed an order today for your standing lab work.    Please come back and get your standing labs in December and every 5 months   We have open lab daily Monday through Thursday from 8:30-12:30 PM and 1:30-4:30 PM and Friday from 8:30-12:30 PM and 1:30-4:00 PM at the office of Dr. Bo Merino.   You may experience shorter wait times on Monday and Friday afternoons. The office is located at [address], [place], [place], [place] [address] No appointment is necessary.   Labs are drawn by [facility].  You may receive a bill from [facility] for your lab work.  If you wish to have your labs drawn at another location, please call the office 24 hours in advance to send orders.  If you have any questions regarding directions or hours of operation,  please call [phone].   Just as a reminder please drink plenty of water prior to coming for your lab work. Thanks!

## 2019-02-04 NOTE — Telephone Encounter (Signed)
Pt. Called stating she needs a refill on her hydrochlorothiazide 12.5mg  to [facility] she also wants to know if we have any samples of the medication because she is almost out and needs some more now.

## 2019-02-04 NOTE — Telephone Encounter (Signed)
Ok to provide prescription for rollator walker with a seat

## 2019-02-04 NOTE — Telephone Encounter (Signed)
Called patient on number provided and voicemail has not been set up. Called home phone and left another message.

## 2019-02-04 NOTE — Telephone Encounter (Signed)
Patient wanting a rx for a Rollaider. Fax to [phone] (at [facility]?) Patient is buying a cane, but wants an rx for the walker with a sit. Please call to advise.

## 2019-02-05 MED ORDER — ROLLATOR ULTRA-LIGHT MISC: 0 refills | Status: DC

## 2019-02-05 NOTE — Addendum Note (Signed)
Addended by: Carole Binning on: 02/05/2019 11:19 AM   Modules accepted: Orders

## 2019-02-05 NOTE — Telephone Encounter (Signed)
Attempted to contact the patient and left message to advise we are mail prescription for rolling walker.

## 2019-02-05 NOTE — Telephone Encounter (Signed)
Pt is not on any BTs

## 2019-02-05 NOTE — Telephone Encounter (Signed)
Pt is scheduled for 02/24/2019 with driver.

## 2019-02-06 ENCOUNTER — Other Ambulatory Visit: Payer: Self-pay

## 2019-02-06 DIAGNOSIS — Z20822 Contact with and (suspected) exposure to covid-19: Secondary | ICD-10-CM

## 2019-02-08 LAB — NOVEL CORONAVIRUS, NAA: SARS-CoV-2, NAA: NOT DETECTED

## 2019-02-11 ENCOUNTER — Other Ambulatory Visit: Payer: Self-pay

## 2019-02-11 ENCOUNTER — Encounter: Payer: Self-pay | Admitting: Gastroenterology

## 2019-02-11 ENCOUNTER — Ambulatory Visit (AMBULATORY_SURGERY_CENTER): Payer: BC Managed Care – PPO | Admitting: Gastroenterology

## 2019-02-11 VITALS — BP 128/81 | HR 72 | Temp 97.6°F | Resp 14 | Ht 69.0 in | Wt 237.6 lb

## 2019-02-11 DIAGNOSIS — Z800 Family history of malignant neoplasm of digestive organs: Secondary | ICD-10-CM | POA: Diagnosis not present

## 2019-02-11 DIAGNOSIS — Z1211 Encounter for screening for malignant neoplasm of colon: Secondary | ICD-10-CM | POA: Diagnosis not present

## 2019-02-11 MED ORDER — SODIUM CHLORIDE 0.9 % IV SOLN: 500.0000 mL | Freq: Once | INTRAVENOUS | Status: DC

## 2019-02-11 NOTE — Progress Notes (Signed)
VS- Lisa Briggs  Temperature- [name]  Pt ate 02-10-19 at 1400- meat, bread and orange.  States she drank all of prep and results are yellow in the toilet.  Dr. Ardis Hughs is aware and ok to proceed.  Pt's states no medical or surgical changes since previsit or office visit.

## 2019-02-11 NOTE — Patient Instructions (Signed)
Handout given for diverticulosis.  YOU HAD AN ENDOSCOPIC PROCEDURE TODAY AT THE [facility]:   Refer to the procedure report that was given to you for any specific questions about what was found during the examination.  If the procedure report does not answer your questions, please call your gastroenterologist to clarify.  If you requested that your care partner not be given the details of your procedure findings, then the procedure report has been included in a sealed envelope for you to review at your convenience later.  YOU SHOULD EXPECT: Some feelings of bloating in the abdomen. Passage of more gas than usual.  Walking can help get rid of the air that was put into your GI tract during the procedure and reduce the bloating. If you had a lower endoscopy (such as a colonoscopy or flexible sigmoidoscopy) you may notice spotting of blood in your stool or on the toilet paper. If you underwent a bowel prep for your procedure, you may not have a normal bowel movement for a few days.  Please Note:  You might notice some irritation and congestion in your nose or some drainage.  This is from the oxygen used during your procedure.  There is no need for concern and it should clear up in a day or so.  SYMPTOMS TO REPORT IMMEDIATELY:   Following lower endoscopy (colonoscopy or flexible sigmoidoscopy):  Excessive amounts of blood in the stool  Significant tenderness or worsening of abdominal pains  Swelling of the abdomen that is new, acute  Fever of 100F or higher  For urgent or emergent issues, a gastroenterologist can be reached at any hour by calling [phone].   DIET:  We do recommend a small meal at first, but then you may proceed to your regular diet.  Drink plenty of fluids but you should avoid alcoholic beverages for 24 hours.  ACTIVITY:  You should plan to take it easy for the rest of today and you should NOT DRIVE or use heavy machinery until tomorrow (because of the sedation  medicines used during the test).    FOLLOW UP: Our staff will call the number listed on your records 48-72 hours following your procedure to check on you and address any questions or concerns that you may have regarding the information given to you following your procedure. If we do not reach you, we will leave a message.  We will attempt to reach you two times.  During this call, we will ask if you have developed any symptoms of COVID 19. If you develop any symptoms (ie: fever, flu-like symptoms, shortness of breath, cough etc.) before then, please call [phone].  If you test positive for Covid 19 in the 2 weeks post procedure, please call and report this information to us.    If any biopsies were taken you will be contacted by phone or by letter within the next 1-3 weeks.  Please call us at [phone] if you have not heard about the biopsies in 3 weeks.    SIGNATURES/CONFIDENTIALITY: You and/or your care partner have signed paperwork which will be entered into your electronic medical record.  These signatures attest to the fact that that the information above on your After Visit Summary has been reviewed and is understood.  Full responsibility of the confidentiality of this discharge information lies with you and/or your care-partner. 

## 2019-02-11 NOTE — Progress Notes (Signed)
Report given to PACU, vss 

## 2019-02-11 NOTE — Op Note (Signed)
[facility] Patient Name: Lisa Briggs Procedure Date: 02/11/2019 2:18 PM MRN: [id number] Endoscopist: Milus Banister , MD Age: 58 Referring MD:  Date of Birth: [birth year] Gender: Female Account #: [id number] Procedure:                Colonoscopy Indications:              Screening in patient at increased risk: Family                            history of 1st-degree relative with colorectal                            cancer before age 50 years Medicines:                Monitored Anesthesia Care Procedure:                Pre-Anesthesia Assessment:                           - Prior to the procedure, a History and Physical                            was performed, and patient medications and                            allergies were reviewed. The patient's tolerance of                            previous anesthesia was also reviewed. The risks                            and benefits of the procedure and the sedation                            options and risks were discussed with the patient.                            All questions were answered, and informed consent                            was obtained. Prior Anticoagulants: The patient has                            taken no previous anticoagulant or antiplatelet                            agents. ASA Grade Assessment: II - A patient with                            mild systemic disease. After reviewing the risks                            and benefits, the patient was deemed in  satisfactory condition to undergo the procedure.                           After obtaining informed consent, the colonoscope                            was passed under direct vision. Throughout the                            procedure, the patient's blood pressure, pulse, and                            oxygen saturations were monitored continuously. The                            Colonoscope was introduced through the  anus and                            advanced to the the cecum, identified by                            appendiceal orifice and ileocecal valve. The                            colonoscopy was performed without difficulty. The                            patient tolerated the procedure well. The quality                            of the bowel preparation was good. The ileocecal                            valve, appendiceal orifice, and rectum were                            photographed. Scope In: 2:30:14 PM Scope Out: 2:43:10 PM Scope Withdrawal Time: 0 hours 9 minutes 2 seconds  Total Procedure Duration: 0 hours 12 minutes 56 seconds  Findings:                 Multiple small and large-mouthed diverticula were                            found in the left colon.                           The exam was otherwise without abnormality on                            direct and retroflexion views. Complications:            No immediate complications. Estimated blood loss:                            None. Estimated Blood Loss:  Estimated blood loss: none. Impression:               - Diverticulosis in the left colon.                           - The examination was otherwise normal on direct                            and retroflexion views.                           - No polyps or cancers. Recommendation:           - Patient has a contact number available for                            emergencies. The signs and symptoms of potential                            delayed complications were discussed with the                            patient. Return to normal activities tomorrow.                            Written discharge instructions were provided to the                            patient.                           - Resume previous diet.                           - Continue present medications.                           - Repeat colonoscopy in 5 years for screening. Milus Banister,  MD 02/11/2019 2:47:04 PM This report has been signed electronically.

## 2019-02-12 ENCOUNTER — Other Ambulatory Visit: Payer: Self-pay | Admitting: Medical

## 2019-02-13 ENCOUNTER — Telehealth: Payer: Self-pay

## 2019-02-13 NOTE — Telephone Encounter (Signed)
  Follow up Call-  Call back number 02/11/2019  Post procedure Call Back phone  # [phone]  Permission to leave phone message Yes  Some recent data might be hidden     Patient questions:  Do you have a fever, pain , or abdominal swelling? No. Pain Score  0 *  Have you tolerated food without any problems? Yes.    Have you been able to return to your normal activities? Yes.    Do you have any questions about your discharge instructions: Diet   No. Medications  No. Follow up visit  No.  Do you have questions or concerns about your Care? No.  Actions: * If pain score is 4 or above: No action needed, pain <4. 1. Have you developed a fever since your procedure? no  2.   Have you had an respiratory symptoms (SOB or cough) since your procedure? no  3.   Have you tested positive for COVID 19 since your procedure no  4.   Have you had any family members/close contacts diagnosed with the COVID 19 since your procedure?  no   If yes to any of these questions please route to Joylene John, RN and Alphonsa Gin, Therapist, sports.

## 2019-02-14 DIAGNOSIS — M79604 Pain in right leg: Secondary | ICD-10-CM | POA: Diagnosis not present

## 2019-02-14 DIAGNOSIS — M79605 Pain in left leg: Secondary | ICD-10-CM | POA: Diagnosis not present

## 2019-02-14 DIAGNOSIS — M546 Pain in thoracic spine: Secondary | ICD-10-CM | POA: Diagnosis not present

## 2019-02-14 DIAGNOSIS — M545 Low back pain: Secondary | ICD-10-CM | POA: Diagnosis not present

## 2019-02-17 DIAGNOSIS — M1990 Unspecified osteoarthritis, unspecified site: Secondary | ICD-10-CM | POA: Diagnosis not present

## 2019-02-17 DIAGNOSIS — M79604 Pain in right leg: Secondary | ICD-10-CM | POA: Diagnosis not present

## 2019-02-17 DIAGNOSIS — M546 Pain in thoracic spine: Secondary | ICD-10-CM | POA: Diagnosis not present

## 2019-02-17 DIAGNOSIS — M79605 Pain in left leg: Secondary | ICD-10-CM | POA: Diagnosis not present

## 2019-02-17 DIAGNOSIS — M545 Low back pain: Secondary | ICD-10-CM | POA: Diagnosis not present

## 2019-02-20 DIAGNOSIS — M546 Pain in thoracic spine: Secondary | ICD-10-CM | POA: Diagnosis not present

## 2019-02-20 DIAGNOSIS — M79604 Pain in right leg: Secondary | ICD-10-CM | POA: Diagnosis not present

## 2019-02-20 DIAGNOSIS — M545 Low back pain: Secondary | ICD-10-CM | POA: Diagnosis not present

## 2019-02-20 DIAGNOSIS — M79605 Pain in left leg: Secondary | ICD-10-CM | POA: Diagnosis not present

## 2019-02-24 ENCOUNTER — Encounter: Payer: Self-pay | Admitting: Physical Medicine and Rehabilitation

## 2019-02-24 ENCOUNTER — Other Ambulatory Visit: Payer: Self-pay

## 2019-02-24 ENCOUNTER — Ambulatory Visit: Payer: Self-pay

## 2019-02-24 ENCOUNTER — Telehealth: Payer: Self-pay | Admitting: Medical

## 2019-02-24 ENCOUNTER — Ambulatory Visit (INDEPENDENT_AMBULATORY_CARE_PROVIDER_SITE_OTHER): Payer: BC Managed Care – PPO | Admitting: Physical Medicine and Rehabilitation

## 2019-02-24 VITALS — BP 109/71 | HR 98

## 2019-02-24 DIAGNOSIS — M5416 Radiculopathy, lumbar region: Secondary | ICD-10-CM | POA: Diagnosis not present

## 2019-02-24 MED ORDER — METHYLPREDNISOLONE ACETATE 80 MG/ML IJ SUSP
80.0000 mg | Freq: Once | INTRAMUSCULAR | Status: AC
  Administered 2019-02-24: 80 mg

## 2019-02-24 NOTE — Progress Notes (Signed)
Lisa Briggs - 58 y.o. female MRN [id number]  Date of birth: 15-Aug-1960  Office Visit Note: Visit Date: 02/24/2019 PCP: Carlena Hurl, PA-C Referred by: Carlena Hurl, PA-C  Subjective: Chief Complaint  Patient presents with  . Lower Back - Pain  . Right Leg - Pain  . Left Leg - Pain   HPI:  Lisa Briggs is a 58 y.o. female who comes in today At the request of Dr. Basil Dess for L5/S1 interlaminar epidural steroid injection.  Patient will follow work in June of this year with increasing pain low back and radiating in both legs.  MRI reviewed below and reviewed with patient.  She will follow-up with Dr. Louanne Skye as scheduled.  ROS Otherwise per HPI.  Assessment & Plan: Visit Diagnoses:  1. Lumbar radiculopathy     Plan: No additional findings.   Meds & Orders:  Meds ordered this encounter  Medications  . methylPREDNISolone acetate (DEPO-MEDROL) injection 80 mg    Orders Placed This Encounter  Procedures  . XR C-ARM NO REPORT  . Epidural Steroid injection    Follow-up: Return for Basil Dess, MD.   Procedures: No procedures performed  Lumbar Epidural Steroid Injection - Interlaminar Approach with Fluoroscopic Guidance  [name]      Date of Birth: 02-03-1961 MRN: [id number] PCP: Carlena Hurl, PA-C      Visit Date: 02/24/2019   Universal Protocol:     Consent Given By: the patient  Position: PRONE  Additional Comments: Vital signs were monitored before and after the procedure. Patient was prepped and draped in the usual sterile fashion. The correct patient, procedure, and site was verified.   Injection Procedure Details:  Procedure Site One Meds Administered:  Meds ordered this encounter  Medications  . methylPREDNISolone acetate (DEPO-MEDROL) injection 80 mg     Laterality: Right  Location/Site:  L5-S1  Needle size: 20 G  Needle type: Tuohy  Needle Placement: Paramedian epidural  Findings:   -Comments: Excellent flow  of contrast into the epidural space.  Procedure Details: Using a paramedian approach from the side mentioned above, the region overlying the inferior lamina was localized under fluoroscopic visualization and the soft tissues overlying this structure were infiltrated with 4 ml. of 1% Lidocaine without Epinephrine. The Tuohy needle was inserted into the epidural space using a paramedian approach.   The epidural space was localized using loss of resistance along with lateral and bi-planar fluoroscopic views.  After negative aspirate for air, blood, and CSF, a 2 ml. volume of Isovue-250 was injected into the epidural space and the flow of contrast was observed. Radiographs were obtained for documentation purposes.    The injectate was administered into the level noted above.   Additional Comments:  The patient tolerated the procedure well Dressing: 2 x 2 sterile gauze and Band-Aid    Post-procedure details: Patient was observed during the procedure. Post-procedure instructions were reviewed.  Patient left the clinic in stable condition.   Clinical History: MRI LUMBAR SPINE WITHOUT CONTRAST  TECHNIQUE: Multiplanar, multisequence MR imaging of the lumbar spine was performed. No intravenous contrast was administered.  COMPARISON:  10/13/2016  FINDINGS: Segmentation:  Standard.  Alignment: Slight right convex curvature of the lumbar spine. No listhesis.  Vertebrae: No fracture or suspicious osseous lesion. Prominent chronic degenerative endplate changes at 075-GRM and L5-S1 with minimal edema at the latter.  Conus medullaris and cauda equina: Conus extends to the L1 level. Conus and cauda equina appear normal.  Paraspinal and  other soft tissues: Unchanged 6 mm T2 hyperintense focus posteriorly in the interpolar left kidney, likely a cyst.  Disc levels:  Disc desiccation and disc space narrowing throughout the lumbar and included lower thoracic spine with disc space  narrowing being severe at L4-5 and moderate at the other levels.  T11-12: Only imaged sagittally. Disc bulging and mild facet arthrosis result in mild bilateral neural foraminal stenosis without significant spinal stenosis, unchanged.  T12-L1: Mild disc bulging and mild-to-moderate facet arthrosis without stenosis, unchanged.  L1-2: Increased circumferential disc bulging and mild-to-moderate facet and ligamentum flavum hypertrophy result in mild bilateral neural foraminal stenosis without spinal stenosis.  L2-3: Disc bulging, a left subarticular disc protrusion, and moderate facet and ligamentum flavum hypertrophy result in severe left lateral recess stenosis and mild-to-moderate left greater than right neural foraminal stenosis. Left lateral recess stenosis is unchanged with potential left L3 nerve root impingement. Neural foraminal stenosis may have mildly progressed, particularly on the right. There is unchanged mild spinal stenosis.  L3-4: Disc bulging and moderate facet and ligamentum flavum hypertrophy result in mild bilateral neural foraminal stenosis without spinal stenosis, unchanged.  L4-5: There is a left foraminal disc osteophyte complex with decreased size of the soft disc component compared to the prior study. The disc osteophyte complex, severe disc space height loss, and facet hypertrophy result in persistent mild left neural foraminal stenosis without evidence of L4 nerve root compression. There is facet ankylosis bilaterally. No spinal stenosis.  L5-S1: Disc bulging, a broad right subarticular to right extraforaminal disc protrusion, disc space height loss, endplate spurring, and moderate facet hypertrophy result in moderate right lateral recess and severe right greater than left neural foraminal stenosis with potential right S1 and bilateral L5 nerve root impingement, unchanged. No spinal stenosis.  IMPRESSION: 1. Slight progression of  mild-to-moderate bilateral neural foraminal stenosis at L2-3 with unchanged severe left lateral recess stenosis due to a subarticular disc protrusion. 2. Progressive disc degeneration at L1-2 with mild bilateral neural foraminal stenosis. 3. Decreased size of left foraminal disc protrusion at L4-5 with persistent mild left neural foraminal stenosis. 4. Unchanged severe bilateral neural foraminal stenosis and moderate right lateral recess stenosis at L5-S1 with potential bilateral L5 and right S1 nerve root impingement.   Electronically Signed   By: Logan Bores M.D.   On: 12/22/2018 10:14 ----- 11/14/17 [place] of bilateral Hands - Deveshwar  Right median nerve was 0.10 cm squares which was normal limits and left median nerve was 0.15 cm squares which was more than upper limits of normal.  Impression: Ultrasound examination not show any synovitis. The left median nerve was enlarged.     Objective:  VS:  HT:    WT:   BMI:     BP:109/71  HR:98bpm  TEMP: ( )  RESP:  Physical Exam Constitutional:      General: She is not in acute distress.    Appearance: Normal appearance. She is not ill-appearing.  HENT:     Head: Normocephalic and atraumatic.     Right Ear: External ear normal.     Left Ear: External ear normal.  Eyes:     Extraocular Movements: Extraocular movements intact.  Cardiovascular:     Rate and Rhythm: Normal rate.     Pulses: Normal pulses.  Musculoskeletal:     Right lower leg: No edema.     Left lower leg: No edema.     Comments: Patient has good distal strength with no pain over the greater trochanters.  No clonus or focal weakness.  Skin:    Findings: No erythema, lesion or rash.  Neurological:     General: No focal deficit present.     Mental Status: She is alert and oriented to person, place, and time.     Sensory: No sensory deficit.     Motor: No weakness or abnormal muscle tone.     Coordination: Coordination normal.  Psychiatric:        Mood  and Affect: Mood normal.        Behavior: Behavior normal.     Ortho Exam Imaging: No results found.

## 2019-02-24 NOTE — Progress Notes (Signed)
.  Numeric Pain Rating Scale and Functional Assessment Average Pain 10   In the last MONTH (on 0-10 scale) has pain interfered with the following?  1. General activity like being  able to carry out your everyday physical activities such as walking, climbing stairs, carrying groceries, or moving a chair?  Rating(10)   -Driver(no driver will call husband), -BT, -Dye Allergies.

## 2019-02-24 NOTE — Procedures (Signed)
Lumbar Epidural Steroid Injection - Interlaminar Approach with Fluoroscopic Guidance  [name]      Date of Birth: 10/13/1960 MRN: [id number] PCP: Carlena Hurl, PA-C      Visit Date: 02/24/2019   Universal Protocol:     Consent Given By: the patient  Position: PRONE  Additional Comments: Vital signs were monitored before and after the procedure. Patient was prepped and draped in the usual sterile fashion. The correct patient, procedure, and site was verified.   Injection Procedure Details:  Procedure Site One Meds Administered:  Meds ordered this encounter  Medications  . methylPREDNISolone acetate (DEPO-MEDROL) injection 80 mg     Laterality: Right  Location/Site:  L5-S1  Needle size: 20 G  Needle type: Tuohy  Needle Placement: Paramedian epidural  Findings:   -Comments: Excellent flow of contrast into the epidural space.  Procedure Details: Using a paramedian approach from the side mentioned above, the region overlying the inferior lamina was localized under fluoroscopic visualization and the soft tissues overlying this structure were infiltrated with 4 ml. of 1% Lidocaine without Epinephrine. The Tuohy needle was inserted into the epidural space using a paramedian approach.   The epidural space was localized using loss of resistance along with lateral and bi-planar fluoroscopic views.  After negative aspirate for air, blood, and CSF, a 2 ml. volume of Isovue-250 was injected into the epidural space and the flow of contrast was observed. Radiographs were obtained for documentation purposes.    The injectate was administered into the level noted above.   Additional Comments:  The patient tolerated the procedure well Dressing: 2 x 2 sterile gauze and Band-Aid    Post-procedure details: Patient was observed during the procedure. Post-procedure instructions were reviewed.  Patient left the clinic in stable condition.

## 2019-02-24 NOTE — Telephone Encounter (Signed)
Pt called and needs refill on Meloxicam [facility] to [facility]

## 2019-02-25 DIAGNOSIS — M79605 Pain in left leg: Secondary | ICD-10-CM | POA: Diagnosis not present

## 2019-02-25 DIAGNOSIS — M79604 Pain in right leg: Secondary | ICD-10-CM | POA: Diagnosis not present

## 2019-02-25 DIAGNOSIS — M546 Pain in thoracic spine: Secondary | ICD-10-CM | POA: Diagnosis not present

## 2019-02-25 DIAGNOSIS — M545 Low back pain: Secondary | ICD-10-CM | POA: Diagnosis not present

## 2019-02-26 ENCOUNTER — Telehealth: Payer: Self-pay | Admitting: Rheumatology

## 2019-02-26 NOTE — Telephone Encounter (Signed)
Patient called stating Dr. Glade Lloyd told her to contact Dr. Estanislado Pandy to refill her prescription of Meloxicam.  Patient is requesting the prescription (90 day supply) be sent to [facility].

## 2019-02-26 NOTE — Telephone Encounter (Signed)
Chana Bode, PA-C has prescribed meloxicam for patient since 2015 according to the chart.  Per his telephone note on 02/24/2019 "Please ask patient to talk with her rheumatologist about prescribing her Meloxicam.  Since they manage her arthritis, they should be the one's prescribing her Meloxicam arthritis medication.   I will defer this medication to them going forward . I think we have addressed this prior as well."  Please advise.

## 2019-02-26 NOTE — Telephone Encounter (Signed)
Please ask patient to talk with her rheumatologist about prescribing her Meloxicam.  Since they manage her arthritis, they should be the one's prescribing her Meloxicam arthritis medication.   I will defer this medication to them going forward . I think we have addressed this prior as well.

## 2019-02-26 NOTE — Telephone Encounter (Signed)
Left pt a v/m.

## 2019-02-26 NOTE — Telephone Encounter (Signed)
I returned patient's call and discussed that long-term NSAIDs are not advised.  Her rheumatoid arthritis is very well controlled and she had no synovitis.  Side effects of NSAIDs were discussed.  She was convinced to not to not to take NSAIDs anymore.  She may use Tylenol for joint discomfort.

## 2019-03-03 DIAGNOSIS — M79605 Pain in left leg: Secondary | ICD-10-CM | POA: Diagnosis not present

## 2019-03-03 DIAGNOSIS — M545 Low back pain: Secondary | ICD-10-CM | POA: Diagnosis not present

## 2019-03-03 DIAGNOSIS — M546 Pain in thoracic spine: Secondary | ICD-10-CM | POA: Diagnosis not present

## 2019-03-03 DIAGNOSIS — M79604 Pain in right leg: Secondary | ICD-10-CM | POA: Diagnosis not present

## 2019-03-04 ENCOUNTER — Other Ambulatory Visit: Payer: Self-pay

## 2019-03-04 ENCOUNTER — Telehealth: Payer: Self-pay

## 2019-03-04 ENCOUNTER — Telehealth: Payer: Self-pay | Admitting: Rheumatology

## 2019-03-04 DIAGNOSIS — Z79899 Other long term (current) drug therapy: Secondary | ICD-10-CM

## 2019-03-04 NOTE — Telephone Encounter (Signed)
Received fax from [facility] for a refill on Meloxicam 7.5mg  2 times daily 90 day supply, pt. Last apt. Was 12/17/18

## 2019-03-04 NOTE — Telephone Encounter (Signed)
Opened in error

## 2019-03-04 NOTE — Telephone Encounter (Signed)
Please forward this or fax to Dr. Arlean Hopping office/rheumatology

## 2019-03-04 NOTE — Telephone Encounter (Signed)
Prescription has been faxed to Dr. Arlean Hopping office

## 2019-03-05 ENCOUNTER — Encounter: Payer: Self-pay | Admitting: *Deleted

## 2019-03-05 ENCOUNTER — Ambulatory Visit: Payer: BC Managed Care – PPO | Admitting: Specialist

## 2019-03-05 DIAGNOSIS — M546 Pain in thoracic spine: Secondary | ICD-10-CM | POA: Diagnosis not present

## 2019-03-05 DIAGNOSIS — M79604 Pain in right leg: Secondary | ICD-10-CM | POA: Diagnosis not present

## 2019-03-05 DIAGNOSIS — M79605 Pain in left leg: Secondary | ICD-10-CM | POA: Diagnosis not present

## 2019-03-05 DIAGNOSIS — M545 Low back pain: Secondary | ICD-10-CM | POA: Diagnosis not present

## 2019-03-05 LAB — COMPLETE METABOLIC PANEL WITH GFR
AG Ratio: 1.4 (calc) (ref 1.0–2.5)
ALT: 18 U/L (ref 6–29)
AST: 19 U/L (ref 10–35)
Albumin: 4.2 g/dL (ref 3.6–5.1)
Alkaline phosphatase (APISO): 116 U/L (ref 37–153)
BUN: 17 mg/dL (ref 7–25)
CO2: 30 mmol/L (ref 20–32)
Calcium: 9.9 mg/dL (ref 8.6–10.4)
Chloride: 102 mmol/L (ref 98–110)
Creat: 0.8 mg/dL (ref 0.50–1.05)
GFR, Est African American: 96 mL/min/{1.73_m2} (ref >=60)
GFR, Est Non African American: 82 mL/min/{1.73_m2} (ref >=60)
Globulin: 3.1 g/dL (calc) (ref 1.9–3.7)
Glucose, Bld: 88 mg/dL (ref 65–99)
Potassium: 3.9 mmol/L (ref 3.5–5.3)
Sodium: 142 mmol/L (ref 135–146)
Total Bilirubin: 0.4 mg/dL (ref 0.2–1.2)
Total Protein: 7.3 g/dL (ref 6.1–8.1)

## 2019-03-05 LAB — CBC WITH DIFFERENTIAL/PLATELET
Absolute Monocytes: 581 cells/uL (ref 200–950)
Basophils Absolute: 33 cells/uL (ref 0–200)
Basophils Relative: 0.4 %
Eosinophils Absolute: 141 cells/uL (ref 15–500)
Eosinophils Relative: 1.7 %
HCT: 38.6 % (ref 35.0–45.0)
Hemoglobin: 12.4 g/dL (ref 11.7–15.5)
Lymphs Abs: 3104 cells/uL (ref 850–3900)
MCH: 26.8 pg — ABNORMAL LOW (ref 27.0–33.0)
MCHC: 32.1 g/dL (ref 32.0–36.0)
MCV: 83.5 fL (ref 80.0–100.0)
MPV: 9.6 fL (ref 7.5–12.5)
Monocytes Relative: 7 %
Neutro Abs: 4441 cells/uL (ref 1500–7800)
Neutrophils Relative %: 53.5 %
Platelets: 399 10*3/uL (ref 140–400)
RBC: 4.62 10*6/uL (ref 3.80–5.10)
RDW: 12.5 % (ref 11.0–15.0)
Total Lymphocyte: 37.4 %
WBC: 8.3 10*3/uL (ref 3.8–10.8)

## 2019-03-11 ENCOUNTER — Telehealth: Payer: Self-pay | Admitting: Medical

## 2019-03-11 DIAGNOSIS — M546 Pain in thoracic spine: Secondary | ICD-10-CM | POA: Diagnosis not present

## 2019-03-11 DIAGNOSIS — M79604 Pain in right leg: Secondary | ICD-10-CM | POA: Diagnosis not present

## 2019-03-11 DIAGNOSIS — M79605 Pain in left leg: Secondary | ICD-10-CM | POA: Diagnosis not present

## 2019-03-11 DIAGNOSIS — M545 Low back pain: Secondary | ICD-10-CM | POA: Diagnosis not present

## 2019-03-11 NOTE — Telephone Encounter (Signed)
Physical has been scheduled

## 2019-03-11 NOTE — Telephone Encounter (Signed)
As per recent similar message on the meloxicam, send refill request to pharmacy to forward to Dr. Estanislado Pandy.  Rheumatology should be prescribing the Meloxicam now.    As per preventative care doucmenation, if it has been a year since last physical, then schedule physical

## 2019-03-11 NOTE — Telephone Encounter (Signed)
Pt came in and dropped a form to be completed. She also sent in a copy of labs drawn at another facility. Sending to [facility] per office protocol.

## 2019-03-11 NOTE — Telephone Encounter (Signed)
lmom informing patient she needs a physical for this year before we can fill out her paperwork.

## 2019-03-11 NOTE — Telephone Encounter (Signed)
Received fax from [facility] for a refill on pts. Meloxicam 7.5mg  for a 90 days supply pt last apt. 12/17/18.

## 2019-03-13 ENCOUNTER — Other Ambulatory Visit: Payer: Self-pay

## 2019-03-13 DIAGNOSIS — Z20822 Contact with and (suspected) exposure to covid-19: Secondary | ICD-10-CM

## 2019-03-14 LAB — NOVEL CORONAVIRUS, NAA: SARS-CoV-2, NAA: NOT DETECTED

## 2019-03-17 ENCOUNTER — Ambulatory Visit: Payer: BC Managed Care – PPO | Admitting: Specialist

## 2019-03-18 ENCOUNTER — Other Ambulatory Visit: Payer: Self-pay

## 2019-03-18 ENCOUNTER — Encounter: Payer: Self-pay | Admitting: Specialist

## 2019-03-18 ENCOUNTER — Ambulatory Visit (INDEPENDENT_AMBULATORY_CARE_PROVIDER_SITE_OTHER): Payer: BC Managed Care – PPO | Admitting: Specialist

## 2019-03-18 ENCOUNTER — Encounter: Payer: Self-pay | Admitting: Physical Medicine and Rehabilitation

## 2019-03-18 VITALS — BP 118/78 | HR 94 | Ht 69.0 in | Wt 241.0 lb

## 2019-03-18 DIAGNOSIS — G5603 Carpal tunnel syndrome, bilateral upper limbs: Secondary | ICD-10-CM

## 2019-03-18 DIAGNOSIS — M51369 Other intervertebral disc degeneration, lumbar region without mention of lumbar back pain or lower extremity pain: Secondary | ICD-10-CM

## 2019-03-18 DIAGNOSIS — M5136 Other intervertebral disc degeneration, lumbar region: Secondary | ICD-10-CM | POA: Diagnosis not present

## 2019-03-18 DIAGNOSIS — M5116 Intervertebral disc disorders with radiculopathy, lumbar region: Secondary | ICD-10-CM

## 2019-03-18 DIAGNOSIS — M48062 Spinal stenosis, lumbar region with neurogenic claudication: Secondary | ICD-10-CM

## 2019-03-18 DIAGNOSIS — M797 Fibromyalgia: Secondary | ICD-10-CM

## 2019-03-18 DIAGNOSIS — M4726 Other spondylosis with radiculopathy, lumbar region: Secondary | ICD-10-CM | POA: Diagnosis not present

## 2019-03-18 MED ORDER — METHYLPREDNISOLONE ACETATE 40 MG/ML IJ SUSP
20.0000 mg | INTRAMUSCULAR | Status: AC | PRN
  Administered 2019-03-18: 20 mg

## 2019-03-18 MED ORDER — LIDOCAINE HCL 1 % IJ SOLN
3.0000 mL | INTRAMUSCULAR | Status: AC | PRN
  Administered 2019-03-18: 12:00:00 3 mL

## 2019-03-18 NOTE — Patient Instructions (Addendum)
Plan: Avoid bending, stooping and avoid lifting weights greater than 10 lbs. Avoid prolong standing and walking. Order for a new walker with wheels. Surgery scheduling secretary Kandice Hams, will call you in the next week to schedule for surgery.  Surgery recommended is a two level lumbar decompression with left L1-2 microdiscectomy and L5-S1 bilateral lateral recess decompression and foramenotomy this would be done with the microscope. Take hydrocodone for for pain. Risk of surgery includes risk of infection 1 in 200 patients, bleeding 1/2% chance you would need a transfusion.   Risk to the nerves is one in 10,000.  Expect improved walking and standing tolerance. Expect relief of leg pain but numbness may persist depending on the length and degree of pressure that has been present. Surgical fusion in the future is sometimes necessary for chronic disc degeneration when nerve decompression alone does not allow for improved function. 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

## 2019-03-18 NOTE — Progress Notes (Addendum)
Office Visit Note   [name]           Date of Birth: Jun 11, 1960           MRN: [id number] Visit Date: 03/18/2019              Requested by: Carlena Hurl, PA-C [address] [place],  [place] [address] PCP: Carlena Hurl, PA-C   Assessment & Plan: Visit Diagnoses:  1. DDD (degenerative disc disease), lumbar   2. Spinal stenosis of lumbar region with neurogenic claudication   3. Herniation of lumbar intervertebral disc with radiculopathy   4. Other spondylosis with radiculopathy, lumbar region   5. Fibromyalgia   6. Carpal tunnel syndrome on both sides     Plan: Avoid bending, stooping and avoid lifting weights greater than 10 lbs. Avoid prolong standing and walking. Order for a new walker with wheels. Surgery scheduling secretary Kandice Hams, will call you in the next week to schedule for surgery.  Surgery recommended is a two level lumbar decompression with left L12-3 microdiscectomy and L5-S1 bilateral lateral recess decompression and foramenotomy this would be done with the microscope. Take hydrocodone for for pain. Risk of surgery includes risk of infection 1 in 200 patients, bleeding 1/2% chance you would need a transfusion.   Risk to the nerves is one in 10,000.  Expect improved walking and standing tolerance. Expect relief of leg pain but numbness may persist depending on the length and degree of pressure that has been present. Surgical fusion in the future is sometimes necessary for chronic disc degeneration when nerve decompression alone does not allow for improved function. 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   Follow-Up Instructions: No follow-ups on file.   Orders:  No orders of the defined types were placed in this encounter.  No orders of the defined types were placed in this encounter.     Procedures: Hand/UE Inj: L carpal tunnel for carpal  tunnel syndrome on 03/18/2019 12:17 PM Details: volar approach Medications: 3 mL lidocaine 1 %; 20 mg methylPREDNISolone acetate 40 MG/ML Consent was given by the patient. Immediately prior to procedure a time out was called to verify the correct patient, procedure, equipment, support staff and site/side marked as required. Patient was prepped and draped in the usual sterile fashion.       Clinical Data: Findings:    Interpretation Summary     EMG & NCV Findings:  Evaluation of the left median (across palm) sensory nerve showed no response (Palm) and prolonged distal peak latency (3.8 ms).  The right median (across palm) sensory nerve showed prolonged distal peak latency (Wrist, 3.9 ms).  All remaining nerves (as indicated in the following tables) were within normal limits.  Left vs. Right side comparison data for the ulnar motor nerve indicates abnormal L-R velocity difference (A Elbow-B Elbow, 18 m/s).  All remaining left vs. right side differences were within normal limits.       All examined muscles (as indicated in the following table) showed no evidence of electrical instability.       Impression:  The above electrodiagnostic study is ABNORMAL and reveals evidence of a mild bilateral median nerve entrapment at the wrist (carpal tunnel syndrome) affecting sensory components.  **This does not explain the totality of her symptoms would continue work-up for musculoskeletal pathology.     There is no significant electrodiagnostic evidence of  any other focal nerve entrapment, brachial plexopathy, cervical radiculopathy or generalized peripheral neuropathy.      As you know, this particular electrodiagnostic study cannot rule out chemical radiculitis or sensory only radiculopathy.     This electrodiagnostic study cannot rule out small fiber polyneuropathy and dysesthesias from central pain sensitization syndromes such as fibromyalgia.  Myotomal referral pain from trigger  points is also not excluded.        Recommendations:  1.  Follow-up with referring physician.  2.  Continue current management of symptoms.  3.  Continue use of resting splint at night-time and as needed during the day.     ___________________________  Wonda Olds  Board Certified, American Board of Physical Medicine and Rehabilitation          Nerve Conduction Studies  Anti Sensory Summary Table       Stim Site   NR   Peak (ms)   Norm Peak (ms)   P-T Amp (V)   Norm P-T Amp   Site1   Site2   Delta-P (ms)   Dist (cm)   Vel (m/s)   Norm Vel (m/s)    Left Median Acr Palm Anti Sensory (2nd Digit)  33.1C    Wrist        *3.8   <3.6   20.4   >10   Wrist   Palm       0.0            Palm   *NR       <2.0                                    Right Median Acr Palm Anti Sensory (2nd Digit)  33.5C    Wrist        *3.9   <3.6   16.3   >10   Wrist   Palm   2.0   0.0            Palm        1.9   <2.0   5.8                                Left Radial Anti Sensory (Base 1st Digit)  32.6C    Wrist        2.1   <3.1   28.9       Wrist   Base 1st Digit   2.1   0.0            Right Radial Anti Sensory (Base 1st Digit)  32.6C    Wrist        2.3   <3.1   22.7       Wrist   Base 1st Digit   2.3   0.0            Left Ulnar Anti Sensory (5th Digit)  33.5C    Wrist        3.4   <3.7   25.9   >15.0   Wrist   5th Digit   3.4   14.0   41   >38    Right Ulnar Anti Sensory (5th Digit)  33.1C    Wrist        3.2   <3.7   15.2   >15.0   Wrist   5th Digit  3.2   14.0   44   >38       Motor Summary Table       Stim Site   NR   Onset (ms)   Norm Onset (ms)   O-P Amp (mV)   Norm O-P Amp   Site1   Site2   Delta-0 (ms)   Dist (cm)   Vel (m/s)     Norm Vel (m/s)    Left Median Motor (Abd Poll Brev)  32.5C    Wrist        4.0   <4.2   8.7   >5   Elbow   Wrist   4.6   24.0   52   >50    Elbow        8.6       8.1                                Right Median Motor (Abd Poll Brev)  32.2C    Wrist        3.8   <4.2   8.7   >5   Elbow   Wrist   4.5   23.0   51   >50    Elbow        8.3       3.8                                Left Ulnar Motor (Abd Dig Min)  32.5C    Wrist        2.7   <4.2   9.9   >3   B Elbow   Wrist   3.6   22.5   63   >53    B Elbow        6.3       9.7       A Elbow   B Elbow   1.3   9.5   73   >53    A Elbow        7.6       10.0                                Right Ulnar Motor (Abd Dig Min)  31.8C    Wrist        2.9   <4.2   11.3   >3   B Elbow   Wrist   3.7   23.0   62   >53    B Elbow        6.6       11.4       A Elbow   B Elbow   1.1   10.0   91   >53    A Elbow        7.7       11.2                                   EMG       Side   Muscle   Nerve   Root   Ins Act   Fibs   Psw   Amp   Dur   Poly   Recrt  Int Fraser Din   Comment    Left   Abd Poll Brev   Median   C8-T1   Nml   Nml   Nml   Nml   Nml   0   Nml   Nml        Left   1stDorInt   Ulnar   C8-T1   Nml   Nml   Nml   Nml   Nml   0   Nml   Nml        Left   PronatorTeres   Median   C6-7   Nml   Nml   Nml   Nml   Nml   0   Nml   Nml        Left   Biceps   Musculocut   C5-6   Nml   Nml   Nml   Nml   Nml   0   Nml   Nml        Left   Deltoid   Axillary   C5-6   Nml   Nml   Nml   Nml   Nml   0   Nml   Nml              Nerve Conduction Studies  Anti  Sensory Left/Right Comparison       Stim Site   L Lat (ms)   R Lat (ms)   L-R Lat (ms)   L Amp (V)   R Amp (V)   L-R Amp (%)   Site1   Site2   L Vel (m/s)   R Vel (m/s)   L-R Vel (m/s)    Median Acr Palm Anti Sensory (2nd Digit)  33.1C    Wrist   *3.8   *3.9   0.1   20.4   16.3   20.1   Wrist   Palm                Palm       1.9           5.8                            Radial Anti Sensory (Base 1st Digit)  32.6C    Wrist   2.1   2.3   0.2   28.9   22.7   21.5   Wrist   Base 1st Digit                Ulnar Anti Sensory (5th Digit)  33.5C    Wrist   3.4   3.2   0.2   25.9   15.2   41.3   Wrist   5th Digit   41   44   3       Motor Left/Right Comparison       Stim Site   L Lat (ms)   R Lat (ms)   L-R Lat (ms)   L Amp (mV)   R Amp (mV)   L-R Amp (%)   Site1   Site2   L Vel (m/s)   R Vel (m/s)   L-R Vel (m/s)    Median Motor (Abd Poll Brev)  32.5C    Wrist   4.0   3.8   0.2   8.7   8.7   0.0   Elbow   Wrist   52   51  1    Elbow   8.6   8.3   0.3   8.1   3.8   53.1                        Ulnar Motor (Abd Dig Min)  32.5C    Wrist   2.7   2.9   0.2   9.9   11.3   12.4   B Elbow   Wrist   63   62   1    B Elbow   6.3   6.6   0.3   9.7   11.4   14.9   A Elbow   B Elbow   73   91   *18    A Elbow   7.6   7.7   0.1   10.0   11.2   10.7                                Waveforms:  untitled image untitled image untitled image      untitled image untitled image untitled image      untitled image untitled image untitled image      untitled image                            Imaging   Imaging Information       Exam Information    Status  Exam Begun     Exam  Ended      Final (99)               Order-Level Documents:   There are no order-level documents.    Encounter-Level Documents - 11/30/2017:   Electronic signature on 11/30/2017 2:49 PM - E-signed            NCV with EMG (electromyography) Order: [id number]  Status:  Final result   Visible to patient:  No (not released) Dx:  Paresthesia of skin; Joint stiffness;...       Details         Narrative        Magnus Sinning, MD     12/04/2017  5:56 AM  EMG & NCV Findings:  Evaluation of the left median (across palm) sensory nerve showed  no response (Palm) and prolonged distal peak latency (3.8 ms).    The right median (across palm) sensory nerve showed prolonged  distal peak latency (Wrist, 3.9 ms).  All remaining nerves (as  indicated in the following tables) were within normal limits.    Left vs. Right side comparison data for the ulnar motor nerve  indicates abnormal L-R velocity difference (A Elbow-B Elbow, 18  m/s).  All remaining left vs. right side differences were within  normal limits.     All examined muscles (as indicated in the following table) showed  no evidence of electrical instability.     Impression:  The above electrodiagnostic study is ABNORMAL and reveals  evidence of a mild bilateral median nerve entrapment at the wrist  (carpal tunnel syndrome) affecting sensory components.  **This  does not explain the totality of her symptoms would continue  work-up for musculoskeletal pathology.   There is no significant electrodiagnostic evidence of any other  focal nerve entrapment, brachial plexopathy, cervical  radiculopathy or generalized peripheral neuropathy.   As you know, this particular electrodiagnostic study cannot  rule  out chemical radiculitis or sensory only radiculopathy.   This electrodiagnostic study cannot rule out small fiber  polyneuropathy and dysesthesias from central pain sensitization  syndromes such  as fibromyalgia.  Myotomal referral pain from  trigger points is also not excluded.    Recommendations:  1.  Follow-up with referring physician.  2.  Continue current management of symptoms.  3.  Continue use of resting splint at night-time and as needed  during the day.   ___________________________  Wonda Olds  Board Certified, American Board of Physical Medicine and  Rehabilitation     Nerve Conduction Studies  Anti Sensory Summary Table    Stim Site NR Peak (ms) Norm Peak (ms) P-T Amp (V) Norm P-T Amp  Site1 Site2 Delta-P (ms) Dist (cm) Vel (m/s) Norm Vel (m/s)  Left Median Acr Palm Anti Sensory (2nd Digit)  33.1C  Wrist    *3.8 <3.6 20.4 >10 Wrist Palm  0.0    Palm *NR  <2.0          Right Median Acr Palm Anti Sensory (2nd Digit)  33.5C  Wrist    *3.9 <3.6 16.3 >10 Wrist Palm 2.0 0.0    Palm    1.9 <2.0 5.8          Left Radial Anti Sensory (Base 1st Digit)  32.6C  Wrist    2.1 <3.1 28.9  Wrist Base 1st Digit 2.1 0.0    Right Radial Anti Sensory (Base 1st Digit)  32.6C  Wrist    2.3 <3.1 22.7  Wrist Base 1st Digit 2.3 0.0    Left Ulnar Anti Sensory (5th Digit)  33.5C  Wrist    3.4 <3.7 25.9 >15.0 Wrist 5th Digit 3.4 14.0 41 >38  Right Ulnar Anti Sensory (5th Digit)  33.1C  Wrist    3.2 <3.7 15.2 >15.0 Wrist 5th Digit 3.2 14.0 44 >38   Motor Summary Table    Stim Site NR Onset (ms) Norm Onset (ms) O-P Amp (mV) Norm O-P  Amp Site1 Site2 Delta-0 (ms) Dist (cm) Vel (m/s) Norm Vel (m/s)  Left Median Motor (Abd Poll Brev)  32.5C  Wrist    4.0 <4.2 8.7 >5 Elbow Wrist 4.6 24.0 52 >50  Elbow    8.6  8.1          Right Median Motor (Abd Poll Brev)  32.2C  Wrist    3.8 <4.2 8.7 >5 Elbow Wrist 4.5 23.0 51 >50  Elbow    8.3  3.8          Left Ulnar Motor (Abd Dig Min)  32.5C  Wrist    2.7 <4.2 9.9 >3 B Elbow Wrist 3.6 22.5 63 >53  B Elbow    6.3  9.7  A Elbow B Elbow 1.3 9.5 73 >53  A Elbow    7.6  10.0          Right Ulnar Motor (Abd Dig Min)  31.8C   Wrist    2.9 <4.2 11.3 >3 B Elbow Wrist 3.7 23.0 62 >53  B Elbow    6.6  11.4  A Elbow B Elbow 1.1 10.0 91 >53  A Elbow    7.7  11.2           EMG    Side Muscle Nerve Root Ins Act Fibs Psw Amp Dur Poly Recrt Int  Fraser Din Comment  Left Abd Poll Brev Median C8-T1 Nml Nml Nml Nml Nml 0 Nml Nml  Left 1stDorInt Ulnar C8-T1 Nml Nml Nml Nml Nml 0 Nml Nml    Left PronatorTeres Median C6-7 Nml Nml Nml Nml Nml 0 Nml Nml    Left Biceps Musculocut C5-6 Nml Nml Nml Nml Nml 0 Nml Nml    Left Deltoid Axillary C5-6 Nml Nml Nml Nml Nml 0 Nml Nml      Nerve Conduction Studies  Anti Sensory Left/Right Comparison    Stim Site L Lat (ms) R Lat (ms) L-R Lat (ms) L Amp (V) R Amp  (V) L-R Amp (%) Site1 Site2 L Vel (m/s) R Vel (m/s) L-R Vel  (m/s)  Median Acr Palm Anti Sensory (2nd Digit)  33.1C  Wrist *3.8 *3.9 0.1 20.4 16.3 20.1 Wrist Palm      Palm  1.9   5.8        Radial Anti Sensory (Base 1st Digit)  32.6C  Wrist 2.1 2.3 0.2 28.9 22.7 21.5 Wrist Base 1st Digit      Ulnar Anti Sensory (5th Digit)  33.5C  Wrist 3.4 3.2 0.2 25.9 15.2 41.3 Wrist 5th Digit 41 44 3   Motor Left/Right Comparison    Stim Site L Lat (ms) R Lat (ms) L-R Lat (ms) L Amp (mV) R Amp  (mV) L-R Amp (%) Site1 Site2 L Vel (m/s) R Vel (m/s) L-R Vel  (m/s)  Median Motor (Abd Poll Brev)  32.5C  Wrist 4.0 3.8 0.2 8.7 8.7 0.0 Elbow Wrist 52 51 1  Elbow 8.6 8.3 0.3 8.1 3.8 53.1        Ulnar Motor (Abd Dig Min)  32.5C  Wrist 2.7 2.9 0.2 9.9 11.3 12.4 B Elbow Wrist 63 62 1  B Elbow 6.3 6.6 0.3 9.7 11.4 14.9 A Elbow B Elbow 73 91 *18  A Elbow 7.6 7.7 0.1 10.0 11.2 10.7           Waveforms:                             Subjective: Chief Complaint  Patient presents with  . Lower Back - Follow-up    She had Right L5-S1 IL on 02/24/2019 with Dr. Ernestina Patches, she states that she may have got some relief, she has had some swelling in her legs and a lot of pain in them and in the knees    58 year old female with  history of back pain and leg pain worsened following a fall at work in 09/2018 and she has been unable to return to work with pain in the back and into the left and right knee and discomfort with standing and walking. She has bilateral UE hand numbness and previous mild CTS per Dr. Ernestina Patches 11/30/2017. She has been unable to see relief of her pain with ESI and is using a cane for ambulation since last month. She is unable to ambulate unassisted without stopping to rest after as little as 100 feet and she has to rest. She has pain due to walking. She as pain into both legs into the knees and the back of the bend of the knees and under the knees and down into the great toe bilaterally. 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 Describes a chronic bowel condition. Since her accident she claims that the pain is such That she has been unable to return to work since Dr. Durward Fortes started her out of work 11/12/2018.  Review of Systems   Objective: Vital Signs: BP 118/78 (BP Location: Right Arm, Patient Position: Sitting)   Pulse 94   Ht 5\' 9"  (1.753 m)   Wt 241 lb (109.3 kg)   LMP 02/23/2015   BMI 35.59 kg/m   Physical Exam  Ortho Exam  Specialty Comments:  No specialty comments available.  Imaging: No results found.   PMFS History: Patient Active Problem List   Diagnosis Date Noted  . Neck pain 11/12/2018  . Chronic pain of right knee 09/13/2018  . Bruising 09/13/2018  . SOB (shortness of breath) 08/30/2018  . Chronic pain of left knee 07/08/2018  . Chronic hip pain 07/08/2018  . Edema 07/08/2018  . Left foot pain 04/08/2018  . Olecranon bursitis of left elbow 04/05/2018  . Leg swelling 12/05/2017  . Need for shingles vaccine 12/05/2017  . Primary osteoarthritis of both hands 11/23/2017  . Rheumatoid factor positive 11/23/2017  . Primary osteoarthritis of both knees 11/23/2017  . Primary osteoarthritis of both feet 11/23/2017  .  DDD (degenerative disc disease), lumbar 11/23/2017  . Vaccine counseling 08/13/2017  . Polyarthralgia 08/13/2017  . Joint stiffness 08/13/2017  . Sensitive skin 01/23/2017  . Need for influenza vaccination 01/23/2017  . Proteinuria 01/23/2017  . History of gastrointestinal stromal tumor (GIST) 04/20/2016  . History of TIA (transient ischemic attack) 04/20/2016  . Screening for breast cancer 04/20/2016  . Estrogen deficiency 04/20/2016  . Chronic maxillary sinusitis 04/20/2016  . Impaired fasting blood sugar 04/20/2016  . Constipation 04/20/2016  . History of fall 04/20/2016  . Chronic radicular lumbar pain 01/27/2016  . Encounter for health maintenance examination in adult 03/22/2015  . Paresthesia 03/22/2015  . Cognitive decline 03/22/2015  . Screening for cervical cancer 03/22/2015  . Vitamin D deficiency 03/22/2015  . History of uterine leiomyoma 03/22/2015  . Gastroesophageal reflux disease without esophagitis 03/02/2014  . Chronic nausea 03/02/2014  . Chronic abdominal pain 03/02/2014  . Rhinitis, allergic 03/02/2014  . Essential hypertension 03/02/2014  . Hyperlipidemia 03/02/2014  . Obesity with serious comorbidity 01/16/2012   Past Medical History:  Diagnosis Date  . Allergy   . Anemia    iron therapy for years as of 10/12; normal Hgb 08/2013  . Arthritis   . Chest pain 04/05/2011   cardiac eval, normal treadmill stress test, Dr. Tollie Eth  . Chronic back pain   . Constipation   . Farsightedness    wears glasses, [facility] care center  . 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 [name], [facility]  . GERD (gastroesophageal reflux disease)   . History of uterine fibroid   . Hyperlipidemia   . Hypertension   . Paresthesia 09/2014   initially thought to be TIA, neurology consult in 12/2014 with other non TIA considerations.    . Polyarthralgia    normal rheumatoid screen 01/2012    Family History  Problem Relation Age of Onset   . Hypertension Mother   . Arthritis Mother   . GER disease Mother   . Glaucoma Mother   . Breast cancer Mother   . Stroke Father   . Breast cancer Sister        breast cancer dx late 15s  . Hypertension Sister   . Colon cancer Brother 6  . Diabetes Brother   . Diabetes Maternal Aunt   . Heart disease Maternal Aunt   . Heart disease Maternal Grandmother   . Heart disease Maternal Grandfather   . Heart disease  Maternal Uncle   . Stroke Maternal Uncle   . Leukemia Sister   . Hypertension Sister   . Healthy Son   . Colon polyps Neg Hx   . Esophageal cancer Neg Hx   . Stomach cancer Neg Hx   . Rectal cancer Neg Hx     Past Surgical History:  Procedure Laterality Date  . COLONOSCOPY  01/2014   diverticulosis, othwerise normal - Dr. Owens Loffler  . ESOPHAGOGASTRODUODENOSCOPY  2013   Dr. Benson Norway, gastritis  . ESOPHAGOGASTRODUODENOSCOPY  [id number]  . EUS N/A 04/16/2014   Procedure: UPPER ENDOSCOPIC ULTRASOUND (EUS) LINEAR;  Surgeon: Milus Banister, MD;  Location: [facility];  Service: Endoscopy;  Laterality: N/A;  . gall stone surgery    . KNEE ARTHROSCOPY Left   . LAPAROSCOPIC GASTRIC RESECTION N/A 06/09/2014   Procedure: LAPAROSCOPIC GASTRIC MASS RESECTION;  Surgeon: [name], MD;  Location: [facility];  Service: General;  Laterality: N/A;  . LIPOMA EXCISION     forehead  . UPPER GASTROINTESTINAL ENDOSCOPY    . UTERINE FIBROID SURGERY     Social History   Occupational History  . Occupation: IT sales professional: [employer]  Tobacco Use  . Smoking status: Never Smoker  . Smokeless tobacco: Never Used  Substance and Sexual Activity  . Alcohol use: No    Alcohol/week: 0.0 standard drinks  . Drug use: No  . Sexual activity: Not on file

## 2019-03-19 ENCOUNTER — Telehealth: Payer: Self-pay | Admitting: Orthopaedic Surgery

## 2019-03-19 ENCOUNTER — Telehealth: Payer: Self-pay

## 2019-03-19 NOTE — Telephone Encounter (Signed)
Paperwork signed and [employer] will fax. Thanks.

## 2019-03-19 NOTE — Telephone Encounter (Signed)
Has all this been sent in? Wanted to make sure before I called patient.  Thanks!

## 2019-03-19 NOTE — Telephone Encounter (Signed)
Called and advised patient that papers have been signed and sent to [employer].

## 2019-03-19 NOTE — Telephone Encounter (Signed)
Patient Lisa Briggs stating she would like to know if her paper work has been signed. Please call to advise.  [phone]

## 2019-03-19 NOTE — Telephone Encounter (Signed)
Patient called and requesting call back. Patient states disability papers were turned in to Dr. Durward Fortes to fill out and patient needs to know status. Patient phone number is [phone]

## 2019-03-21 DIAGNOSIS — M546 Pain in thoracic spine: Secondary | ICD-10-CM | POA: Diagnosis not present

## 2019-03-21 DIAGNOSIS — M79605 Pain in left leg: Secondary | ICD-10-CM | POA: Diagnosis not present

## 2019-03-21 DIAGNOSIS — M545 Low back pain: Secondary | ICD-10-CM | POA: Diagnosis not present

## 2019-03-21 DIAGNOSIS — M79604 Pain in right leg: Secondary | ICD-10-CM | POA: Diagnosis not present

## 2019-03-25 DIAGNOSIS — M79604 Pain in right leg: Secondary | ICD-10-CM | POA: Diagnosis not present

## 2019-03-25 DIAGNOSIS — M545 Low back pain: Secondary | ICD-10-CM | POA: Diagnosis not present

## 2019-03-25 DIAGNOSIS — M546 Pain in thoracic spine: Secondary | ICD-10-CM | POA: Diagnosis not present

## 2019-03-25 DIAGNOSIS — M79605 Pain in left leg: Secondary | ICD-10-CM | POA: Diagnosis not present

## 2019-04-01 ENCOUNTER — Encounter: Payer: BC Managed Care – PPO | Admitting: Medical

## 2019-04-01 DIAGNOSIS — M545 Low back pain: Secondary | ICD-10-CM | POA: Diagnosis not present

## 2019-04-01 DIAGNOSIS — M79604 Pain in right leg: Secondary | ICD-10-CM | POA: Diagnosis not present

## 2019-04-01 DIAGNOSIS — M546 Pain in thoracic spine: Secondary | ICD-10-CM | POA: Diagnosis not present

## 2019-04-01 DIAGNOSIS — M79605 Pain in left leg: Secondary | ICD-10-CM | POA: Diagnosis not present

## 2019-04-02 ENCOUNTER — Encounter: Payer: Self-pay | Admitting: Specialist

## 2019-04-02 ENCOUNTER — Ambulatory Visit (INDEPENDENT_AMBULATORY_CARE_PROVIDER_SITE_OTHER): Payer: BC Managed Care – PPO | Admitting: Specialist

## 2019-04-02 ENCOUNTER — Other Ambulatory Visit: Payer: Self-pay

## 2019-04-02 VITALS — BP 107/70 | HR 93 | Ht 69.0 in | Wt 241.0 lb

## 2019-04-02 DIAGNOSIS — M5126 Other intervertebral disc displacement, lumbar region: Secondary | ICD-10-CM | POA: Diagnosis not present

## 2019-04-02 DIAGNOSIS — M48062 Spinal stenosis, lumbar region with neurogenic claudication: Secondary | ICD-10-CM

## 2019-04-02 DIAGNOSIS — M546 Pain in thoracic spine: Secondary | ICD-10-CM

## 2019-04-02 DIAGNOSIS — G5603 Carpal tunnel syndrome, bilateral upper limbs: Secondary | ICD-10-CM | POA: Diagnosis not present

## 2019-04-02 MED ORDER — METHYLPREDNISOLONE ACETATE 40 MG/ML IJ SUSP
20.0000 mg | INTRAMUSCULAR | Status: AC | PRN
  Administered 2019-04-02: 20 mg

## 2019-04-02 MED ORDER — LIDOCAINE HCL (PF) 1 % IJ SOLN
3.0000 mL | INTRAMUSCULAR | Status: AC | PRN
  Administered 2019-04-02: 3 mL

## 2019-04-02 NOTE — Progress Notes (Signed)
Office Visit Note   [name]           Date of Birth: 04/04/1960           MRN: [id number] Visit Date: 04/02/2019              Requested by: Carlena Hurl, PA-C [address] [place],  [place] [address] PCP: Carlena Hurl, PA-C   Assessment & Plan: Visit Diagnoses:  1. Recurrent herniation of lumbar disc   2. Spinal stenosis of lumbar region with neurogenic claudication   3. Carpal tunnel syndrome, bilateral   4. Pain in thoracic spine     Plan: Plan: Avoid bending, stooping and avoid lifting weights greater than 10 lbs. Avoid prolong standing and walking. Order for a new walker with wheels. Surgery scheduling secretary Kandice Hams, will call you in the next week to schedule for surgery.  Surgery recommended is a two level lumbar decompression with left L12-3 microdiscectomy and L5-S1 bilateral lateral recess decompression and foramenotomy this would be done with the microscope. Take hydrocodone for for pain. Risk of surgery includes risk of infection 1 in 200 patients, bleeding 1/2% chance you would need a transfusion.   Risk to the nerves is one in 10,000.  Expect improved walking and standing tolerance. Expect relief of leg pain but numbness may persist depending on the length and degree of pressure that has been present. Surgical fusion in the future is sometimes necessary for chronic disc degeneration when nerve decompression alone does not allow for improved function. 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   Follow-Up Instructions: No follow-ups on file.    Follow-Up Instructions: No follow-ups on file.   Orders:  Orders Placed This Encounter  Procedures  . Hand/UE Inj: L carpal tunnel   No orders of the defined types were placed in this encounter.     Procedures: Hand/UE Inj: L carpal tunnel for carpal tunnel syndrome on 04/02/2019 11:44  AM Indications: therapeutic Details: 25 G needle, volar approach Medications: 3 mL lidocaine (PF) 1 %; 20 mg methylPREDNISolone acetate 40 MG/ML  Bandaid applied Procedure, treatment alternatives, risks and benefits explained, specific risks discussed. Consent was given by the patient. Immediately prior to procedure a time out was called to verify the correct patient, procedure, equipment, support staff and site/side marked as required.       Clinical Data: No additional findings.   Subjective: Chief Complaint  Patient presents with  . Lower Back - Follow-up    HPI  Review of Systems   Objective: Vital Signs: BP 107/70 (BP Location: Right Arm, Patient Position: Sitting)   Pulse 93   Ht 5\' 9"  (1.753 m)   Wt 241 lb (109.3 kg)   LMP 02/23/2015   BMI 35.59 kg/m   Physical Exam  Ortho Exam  Specialty Comments:  No specialty comments available.  Imaging: No results found.   PMFS History: Patient Active Problem List   Diagnosis Date Noted  . Neck pain 11/12/2018  . Chronic pain of right knee 09/13/2018  . Bruising 09/13/2018  . SOB (shortness of breath) 08/30/2018  . Chronic pain of left knee 07/08/2018  . Chronic hip pain 07/08/2018  . Edema 07/08/2018  . Left foot pain 04/08/2018  . Olecranon bursitis of left elbow 04/05/2018  . Leg swelling 12/05/2017  . Need for shingles vaccine 12/05/2017  . Primary osteoarthritis of both hands 11/23/2017  .  Rheumatoid factor positive 11/23/2017  . Primary osteoarthritis of both knees 11/23/2017  . Primary osteoarthritis of both feet 11/23/2017  . DDD (degenerative disc disease), lumbar 11/23/2017  . Vaccine counseling 08/13/2017  . Polyarthralgia 08/13/2017  . Joint stiffness 08/13/2017  . Sensitive skin 01/23/2017  . Need for influenza vaccination 01/23/2017  . Proteinuria 01/23/2017  . History of gastrointestinal stromal tumor (GIST) 04/20/2016  . History of TIA (transient ischemic attack) 04/20/2016  .  Screening for breast cancer 04/20/2016  . Estrogen deficiency 04/20/2016  . Chronic maxillary sinusitis 04/20/2016  . Impaired fasting blood sugar 04/20/2016  . Constipation 04/20/2016  . History of fall 04/20/2016  . Chronic radicular lumbar pain 01/27/2016  . Encounter for health maintenance examination in adult 03/22/2015  . Paresthesia 03/22/2015  . Cognitive decline 03/22/2015  . Screening for cervical cancer 03/22/2015  . Vitamin D deficiency 03/22/2015  . History of uterine leiomyoma 03/22/2015  . Gastroesophageal reflux disease without esophagitis 03/02/2014  . Chronic nausea 03/02/2014  . Chronic abdominal pain 03/02/2014  . Rhinitis, allergic 03/02/2014  . Essential hypertension 03/02/2014  . Hyperlipidemia 03/02/2014  . Obesity with serious comorbidity 01/16/2012   Past Medical History:  Diagnosis Date  . Allergy   . Anemia    iron therapy for years as of 10/12; normal Hgb 08/2013  . Arthritis   . Chest pain 04/05/2011   cardiac eval, normal treadmill stress test, Dr. Tollie Eth  . Chronic back pain   . Constipation   . Farsightedness    wears glasses, Eye care center  . 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 [name], [facility]  . GERD (gastroesophageal reflux disease)   . History of uterine fibroid   . Hyperlipidemia   . Hypertension   . Paresthesia 09/2014   initially thought to be TIA, neurology consult in 12/2014 with other non TIA considerations.    . Polyarthralgia    normal rheumatoid screen 01/2012    Family History  Problem Relation Age of Onset  . Hypertension Mother   . Arthritis Mother   . GER disease Mother   . Glaucoma Mother   . Breast cancer Mother   . Stroke Father   . Breast cancer Sister        breast cancer dx late 44s  . Hypertension Sister   . Colon cancer Brother 19  . Diabetes Brother   . Diabetes Maternal Aunt   . Heart disease Maternal Aunt   . Heart disease Maternal Grandmother     . Heart disease Maternal Grandfather   . Heart disease Maternal Uncle   . Stroke Maternal Uncle   . Leukemia Sister   . Hypertension Sister   . Healthy Son   . Colon polyps Neg Hx   . Esophageal cancer Neg Hx   . Stomach cancer Neg Hx   . Rectal cancer Neg Hx     Past Surgical History:  Procedure Laterality Date  . COLONOSCOPY  01/2014   diverticulosis, othwerise normal - Dr. Owens Loffler  . ESOPHAGOGASTRODUODENOSCOPY  2013   Dr. Benson Norway, gastritis  . ESOPHAGOGASTRODUODENOSCOPY  [id number]  . EUS N/A 04/16/2014   Procedure: UPPER ENDOSCOPIC ULTRASOUND (EUS) LINEAR;  Surgeon: Milus Banister, MD;  Location[facility];  Service: Endoscopy;  Laterality: N/A;  . gall stone surgery    . KNEE ARTHROSCOPY Left   . LAPAROSCOPIC GASTRIC RESECTION N/A 06/09/2014   Procedure: LAPAROSCOPIC GASTRIC MASS RESECTION;  Surgeon: [name], MD;  Location:  [facility];  Service: General;  Laterality: N/A;  . LIPOMA EXCISION     forehead  . UPPER GASTROINTESTINAL ENDOSCOPY    . UTERINE FIBROID SURGERY     Social History   Occupational History  . Occupation: IT sales professional: [employer]  Tobacco Use  . Smoking status: Never Smoker  . Smokeless tobacco: Never Used  Substance and Sexual Activity  . Alcohol use: No    Alcohol/week: 0.0 standard drinks  . Drug use: No  . Sexual activity: Not on file

## 2019-04-08 DIAGNOSIS — M79605 Pain in left leg: Secondary | ICD-10-CM | POA: Diagnosis not present

## 2019-04-08 DIAGNOSIS — M79604 Pain in right leg: Secondary | ICD-10-CM | POA: Diagnosis not present

## 2019-04-08 DIAGNOSIS — M546 Pain in thoracic spine: Secondary | ICD-10-CM | POA: Diagnosis not present

## 2019-04-08 DIAGNOSIS — M545 Low back pain: Secondary | ICD-10-CM | POA: Diagnosis not present

## 2019-04-10 DIAGNOSIS — M79604 Pain in right leg: Secondary | ICD-10-CM | POA: Diagnosis not present

## 2019-04-10 DIAGNOSIS — M79605 Pain in left leg: Secondary | ICD-10-CM | POA: Diagnosis not present

## 2019-04-10 DIAGNOSIS — M545 Low back pain: Secondary | ICD-10-CM | POA: Diagnosis not present

## 2019-04-10 DIAGNOSIS — M546 Pain in thoracic spine: Secondary | ICD-10-CM | POA: Diagnosis not present

## 2019-04-14 NOTE — Pre-Procedure Instructions (Addendum)
[place], [place] [address] [place] [address] Phone: [phone] Fax: [phone]  [id number] [place], [place] [id number] [place] [id number] Phone: [phone] Fax: [phone]  [place] ([place]), [place] - [address] [place] [address] Phone: [phone] Fax: [phone]      Your procedure is scheduled on Tuesday, January 19th.  Report to [facility] Main Entrance "A" at 5:30 A.M., and check in at the Admitting office.  Call this number if you have problems the morning of surgery:  [phone]  Call [phone] if you have any questions prior to your surgery date Monday-Friday 8am-4pm    Remember:  Do not eat after midnight the night before your surgery   You may drink clear liquids until 4:30 a.m. the morning of your surgery.   Clear liquids allowed are: Water, Non-Citrus Juices (without pulp), Carbonated Beverages, Clear Tea, Black Coffee Only, and Gatorade.   Enhanced Recovery after Surgery for Orthopedics Enhanced Recovery after Surgery is a protocol used to improve the stress on your body and your recovery after surgery.  Patient Instructions  . The night before surgery:  o No food after midnight. ONLY clear liquids after midnight  .  Marland Kitchen The day of surgery (if you do NOT have diabetes):  o Drink ONE (1) Pre-Surgery Clear Ensure as directed.   o This drink was given to you during your hospital  pre-op appointment visit. o The pre-op nurse will instruct you on the time to drink the  Pre-Surgery Ensure depending on your surgery time. o Finish the drink at the designated time by the pre-op nurse.  o Nothing else to drink after completing the  Pre-Surgery Clear Ensure.         If you have questions, please contact your surgeon's  office.  Please complete your PRE-SURGERY ENSURE that was provided to you by 4:30 a.m. the morning of surgery.  Please, if able, drink it in one setting. DO NOT SIP.    Take these medicines the morning of surgery with A SIP OF WATER dexlansoprazole (DEXILANT) gabapentin (NEURONTIN)  linaclotide (LINZESS)  venlafaxine (EFFEXOR) hydroxychloroquine (PLAQUENIL) ondansetron (ZOFRAN)-as needed cyclobenzaprine (FLEXERIL)-as needed fluticasone (FLONASE)-as needed  Follow your surgeon's instructions on when to stop Aspirin.  If no instructions were given by your surgeon then you will need to call the office to get those instructions.    As of today, STOP taking any Aspirin (unless otherwise instructed by your surgeon), Aleve, Naproxen, Ibuprofen, Motrin, Advil, Goody's, BC's, all herbal medications, fish oil, and all vitamins. Including: diclofenac sodium (VOLTAREN) 1 % GEL     The Morning of Surgery  Do not wear jewelry, make-up or nail polish.  Do not wear lotions, powders, or perfumes, or deodorant  Do not shave 48 hours prior to surgery.    Do not bring valuables to the hospital.  [facility] is not responsible for any belongings or valuables.  If you are a smoker, DO NOT Smoke 24 hours prior to surgery  If you wear a CPAP at night please bring your mask, tubing, and machine the morning of surgery   Remember that you must have someone to transport you home after your surgery, and remain with you for 24 hours if you are discharged the same day.   Please bring cases  for contacts, glasses, hearing aids, dentures or bridgework because it cannot be worn into surgery.    Leave your suitcase in the car.  After surgery it may be brought to your room.  For patients admitted to the hospital, discharge time will be determined by your treatment team.  Patients discharged the day of surgery will not be allowed to drive home.    Special instructions:   [facility]- Preparing For  Surgery  Before surgery, you can play an important role. Because skin is not sterile, your skin needs to be as free of germs as possible. You can reduce the number of germs on your skin by washing with CHG (chlorahexidine gluconate) Soap before surgery.  CHG is an antiseptic cleaner which kills germs and bonds with the skin to continue killing germs even after washing.    Oral Hygiene is also important to reduce your risk of infection.  Remember - BRUSH YOUR TEETH THE MORNING OF SURGERY WITH YOUR REGULAR TOOTHPASTE  Please do not use if you have an allergy to CHG or antibacterial soaps. If your skin becomes reddened/irritated stop using the CHG.  Do not shave (including legs and underarms) for at least 48 hours prior to first CHG shower. It is OK to shave your face.  Please follow these instructions carefully.   1. Shower the NIGHT BEFORE SURGERY and the MORNING OF SURGERY with CHG Soap.   2. If you chose to wash your hair, wash your hair first as usual with your normal shampoo.  3. After you shampoo, rinse your hair and body thoroughly to remove the shampoo.  4. Use CHG as you would any other liquid soap. You can apply CHG directly to the skin and wash gently with a scrungie or a clean washcloth.   5. Apply the CHG Soap to your body ONLY FROM THE NECK DOWN.  Do not use on open wounds or open sores. Avoid contact with your eyes, ears, mouth and genitals (private parts). Wash Face and genitals (private parts)  with your normal soap.   6. Wash thoroughly, paying special attention to the area where your surgery will be performed.  7. Thoroughly rinse your body with warm water from the neck down.  8. DO NOT shower/wash with your normal soap after using and rinsing off the CHG Soap.  9. Pat yourself dry with a CLEAN TOWEL.  10. Wear CLEAN PAJAMAS to bed the night before surgery, wear comfortable clothes the morning of surgery  11. Place CLEAN SHEETS on your bed the night of your first  shower and DO NOT SLEEP WITH PETS.    Day of Surgery:  Please shower the morning of surgery with the CHG soap Do not apply any deodorants/lotions. Please wear clean clothes to the hospital/surgery center.   Remember to brush your teeth WITH YOUR REGULAR TOOTHPASTE.   Please read over the following fact sheets that you were given.

## 2019-04-15 ENCOUNTER — Encounter (HOSPITAL_COMMUNITY): Payer: Self-pay

## 2019-04-15 ENCOUNTER — Ambulatory Visit (HOSPITAL_COMMUNITY)
Admission: RE | Admit: 2019-04-15 | Discharge: 2019-04-15 | Disposition: A | Discharge status: Home / Self Care | Payer: BC Managed Care – PPO | Source: Ambulatory Visit | Attending: Surgery | Admitting: Surgery

## 2019-04-15 ENCOUNTER — Other Ambulatory Visit: Payer: Self-pay

## 2019-04-15 ENCOUNTER — Encounter (HOSPITAL_COMMUNITY)
Admission: RE | Admit: 2019-04-15 | Discharge: 2019-04-15 | Disposition: A | Discharge status: Home / Self Care | Payer: BC Managed Care – PPO | Source: Ambulatory Visit | Attending: Specialist | Admitting: Specialist

## 2019-04-15 ENCOUNTER — Telehealth: Payer: Self-pay | Admitting: Medical

## 2019-04-15 DIAGNOSIS — J8410 Pulmonary fibrosis, unspecified: Secondary | ICD-10-CM | POA: Diagnosis not present

## 2019-04-15 DIAGNOSIS — Z01818 Encounter for other preprocedural examination: Secondary | ICD-10-CM | POA: Diagnosis not present

## 2019-04-15 DIAGNOSIS — M546 Pain in thoracic spine: Secondary | ICD-10-CM | POA: Diagnosis not present

## 2019-04-15 DIAGNOSIS — M79604 Pain in right leg: Secondary | ICD-10-CM | POA: Diagnosis not present

## 2019-04-15 DIAGNOSIS — M545 Low back pain: Secondary | ICD-10-CM | POA: Diagnosis not present

## 2019-04-15 DIAGNOSIS — M79605 Pain in left leg: Secondary | ICD-10-CM | POA: Diagnosis not present

## 2019-04-15 HISTORY — DX: Anxiety disorder, unspecified: F41.9

## 2019-04-15 HISTORY — DX: Transient cerebral ischemic attack, unspecified: G45.9

## 2019-04-15 LAB — COMPREHENSIVE METABOLIC PANEL
ALT: 17 U/L (ref 0–44)
AST: 18 U/L (ref 15–41)
Albumin: 3.7 g/dL (ref 3.5–5.0)
Alkaline Phosphatase: 114 U/L (ref 38–126)
Anion gap: 10 (ref 5–15)
BUN: 15 mg/dL (ref 6–20)
CO2: 28 mmol/L (ref 22–32)
Calcium: 9.7 mg/dL (ref 8.9–10.3)
Chloride: 102 mmol/L (ref 98–111)
Creatinine, Ser: 0.73 mg/dL (ref 0.44–1.00)
GFR calc Af Amer: >60 mL/min (ref >60)
GFR calc non Af Amer: >60 mL/min (ref >60)
Glucose, Bld: 81 mg/dL (ref 70–99)
Potassium: 3.8 mmol/L (ref 3.5–5.1)
Sodium: 140 mmol/L (ref 135–145)
Total Bilirubin: 0.4 mg/dL (ref 0.3–1.2)
Total Protein: 7.3 g/dL (ref 6.5–8.1)

## 2019-04-15 LAB — PROTIME-INR
INR: 1 (ref 0.8–1.2)
Prothrombin Time: 12.6 seconds (ref 11.4–15.2)

## 2019-04-15 LAB — URINALYSIS, ROUTINE W REFLEX MICROSCOPIC
Bilirubin Urine: NEGATIVE
Glucose, UA: NEGATIVE mg/dL
Hgb urine dipstick: NEGATIVE
Ketones, ur: NEGATIVE mg/dL
Leukocytes,Ua: NEGATIVE
Nitrite: NEGATIVE
Protein, ur: NEGATIVE mg/dL
Specific Gravity, Urine: 1.019 (ref 1.005–1.030)
pH: 7 (ref 5.0–8.0)

## 2019-04-15 LAB — CBC
HCT: 41.6 % (ref 36.0–46.0)
Hemoglobin: 12.3 g/dL (ref 12.0–15.0)
MCH: 27.1 pg (ref 26.0–34.0)
MCHC: 29.6 g/dL — ABNORMAL LOW (ref 30.0–36.0)
MCV: 91.6 fL (ref 80.0–100.0)
Platelets: 350 10*3/uL (ref 150–400)
RBC: 4.54 MIL/uL (ref 3.87–5.11)
RDW: 13.4 % (ref 11.5–15.5)
WBC: 6.5 10*3/uL (ref 4.0–10.5)
nRBC: 0 % (ref 0.0–0.2)

## 2019-04-15 LAB — SURGICAL PCR SCREEN
MRSA, PCR: NEGATIVE
Staphylococcus aureus: NEGATIVE

## 2019-04-15 NOTE — Telephone Encounter (Signed)
I received surgery clearance request.   See if we can get her in with [facility] cardiovascular ASAP in the next 48 hours.    Surgery due 04/22/2019.  I wanted to get their review of her EKG and prior echo for surgery clearance.   Other than echocardiogram in 2016 with hospital visit, she hasn't seen a cardiologist that I am aware of as an outpatient.

## 2019-04-15 NOTE — Progress Notes (Signed)
PCP - Dr. Chana Bode Cardiologist - denies  PPM/ICD - N/A Device Orders -N/A  Rep Notified - N/A  Chest x-ray - 04/15/19 EKG - 09/02/18 Stress Test -2013  ECHO - 2016 Cardiac Cath -denies   Sleep Study - denies CPAP - denies  Blood Thinner Instructions:N/A Aspirin Instructions: Stop ASA today, 04/15/19. Verified with scheduler at Dr. Otho Ket office.   ERAS Protcol -Yes PRE-SURGERY Ensure or G2- Ensure given.    COVID TEST- Scheduled for Friday, 04/18/19.   Anesthesia review:   Patient denies shortness of breath, fever, cough and chest pain at PAT appointment   All instructions explained to the patient, with a verbal understanding of the material. Patient agrees to go over the instructions while at home for a better understanding. Patient also instructed to self quarantine after being tested for COVID-19. The opportunity to ask questions was provided.   Coronavirus Screening  Have you experienced the following symptoms:  Cough yes/no: No Fever (>100.33F)  yes/no: No Runny nose yes/no: No Sore throat yes/no: No Difficulty breathing/shortness of breath  yes/no: No  Have you or a family member traveled in the last 14 days and where? yes/no: No   If the patient indicates "YES" to the above questions, their PAT will be rescheduled to limit the exposure to others and, the surgeon will be notified. THE PATIENT WILL NEED TO BE ASYMPTOMATIC FOR 14 DAYS.   If the patient is not experiencing any of these symptoms, the PAT nurse will instruct them to NOT bring anyone with them to their appointment since they may have these symptoms or traveled as well.   Please remind your patients and families that hospital visitation restrictions are in effect and the importance of the restrictions.

## 2019-04-16 ENCOUNTER — Ambulatory Visit: Payer: BC Managed Care – PPO | Admitting: Cardiology

## 2019-04-16 ENCOUNTER — Encounter: Payer: Self-pay | Admitting: Surgery

## 2019-04-16 ENCOUNTER — Ambulatory Visit (INDEPENDENT_AMBULATORY_CARE_PROVIDER_SITE_OTHER): Payer: BC Managed Care – PPO | Admitting: Surgery

## 2019-04-16 VITALS — BP 103/72 | HR 95 | Temp 97.8°F

## 2019-04-16 DIAGNOSIS — M51369 Other intervertebral disc degeneration, lumbar region without mention of lumbar back pain or lower extremity pain: Secondary | ICD-10-CM

## 2019-04-16 DIAGNOSIS — M5136 Other intervertebral disc degeneration, lumbar region: Secondary | ICD-10-CM

## 2019-04-16 DIAGNOSIS — M5126 Other intervertebral disc displacement, lumbar region: Secondary | ICD-10-CM

## 2019-04-16 NOTE — Telephone Encounter (Signed)
Patient has appointment scheduled for tomorrow with Dr. Einar Gip

## 2019-04-16 NOTE — Progress Notes (Deleted)
Primary Physician/Referring:  Carlena Hurl, PA-C  Patient ID: Lisa Briggs, female    DOB: 06/25/1960, 59 y.o.   MRN: [id number]  No chief complaint on file.  HPI:    Lisa Briggs  is a 59 y.o. African-American female with seropositive rheumatoid arthritis, hypertension, hyperlipidemia, history of TIA in July 2016 when she presented with left arm and facial numbness, carotid duplex and MRI of the brain at that time had revealed no significant disease, ***  Past Medical History:  Diagnosis Date  . Allergy   . Anemia    iron therapy for years as of 10/12; normal Hgb 08/2013  . Anxiety   . Arthritis   . Chest pain 04/05/2011   cardiac eval, normal treadmill stress test, Dr. Tollie Eth  . Chronic back pain   . Constipation   . Farsightedness    wears glasses, Eye care center  . 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 [name], [facility]  . GERD (gastroesophageal reflux disease)   . History of uterine fibroid   . Hyperlipidemia   . Hypertension   . Paresthesia 09/2014   initially thought to be TIA, neurology consult in 12/2014 with other non TIA considerations.    . Polyarthralgia    normal rheumatoid screen 01/2012  . TIA (transient ischemic attack) 2015/16   Past Surgical History:  Procedure Laterality Date  . COLONOSCOPY  01/2014   diverticulosis, othwerise normal - Dr. Owens Loffler  . ESOPHAGOGASTRODUODENOSCOPY  2013   Dr. Benson Norway, gastritis  . ESOPHAGOGASTRODUODENOSCOPY  [id number]  . EUS N/A 04/16/2014   Procedure: UPPER ENDOSCOPIC ULTRASOUND (EUS) LINEAR;  Surgeon: Milus Banister, MD;  [facility];  Service: Endoscopy;  Laterality: N/A;  . gall stone surgery    . KNEE ARTHROSCOPY Left   . LAPAROSCOPIC GASTRIC RESECTION N/A 06/09/2014   Procedure: LAPAROSCOPIC GASTRIC MASS RESECTION;  Surgeon: [name], MD;  [facility]: [facility];  Service: General;  Laterality: N/A;  . LIPOMA EXCISION     forehead  . UPPER  GASTROINTESTINAL ENDOSCOPY    . UTERINE FIBROID SURGERY     Social History   Tobacco Use  . Smoking status: Never Smoker  . Smokeless tobacco: Never Used  Substance Use Topics  . Alcohol use: No    Alcohol/week: 0.0 standard drinks    ROS  ***ROS Objective  Last menstrual period 02/23/2015.  Vitals with BMI 04/15/2019 04/02/2019 03/18/2019  Height 5\' 9"  5\' 9"  5\' 9"   Weight 244 lbs 11 oz 241 lbs 241 lbs  BMI 36.12 Q000111Q Q000111Q  Systolic 123456 [id number] 123456  Diastolic 69 70 78  Pulse 97 93 94     ***Physical Exam Laboratory examination:   Recent Labs    10/08/18 1051 03/04/19 1442 04/15/19 1422  NA 142 142 140  K 4.2 3.9 3.8  CL 104 102 102  CO2 31 30 28   GLUCOSE 93 88 81  BUN 19 17 15   CREATININE 0.75 0.80 0.73  CALCIUM 9.8 9.9 9.7  GFRNONAA 89 82 >60  GFRAA 103 96 >60   estimated creatinine clearance is 104.2 mL/min (by C-G formula based on SCr of 0.73 mg/dL).  CMP Latest Ref Rng & Units 04/15/2019 03/04/2019 10/08/2018  Glucose 70 - 99 mg/dL 81 88 93  BUN 6 - 20 mg/dL 15 17 19   Creatinine 0.44 - 1.00 mg/dL 0.73 0.80 0.75  Sodium 135 - 145 mmol/L 140 142 142  Potassium 3.5 - 5.1 mmol/L 3.8 3.9 4.2  Chloride 98 - 111 mmol/L 102 102 104  CO2 22 - 32 mmol/L 28 30 31   Calcium 8.9 - 10.3 mg/dL 9.7 9.9 9.8  Total Protein 6.5 - 8.1 g/dL 7.3 7.3 7.1  Total Bilirubin 0.3 - 1.2 mg/dL 0.4 0.4 0.4  Alkaline Phos 38 - 126 U/L 114 - -  AST 15 - 41 U/L 18 19 18   ALT 0 - 44 U/L 17 18 16    CBC Latest Ref Rng & Units 04/15/2019 03/04/2019 10/08/2018  WBC 4.0 - 10.5 K/uL 6.5 8.3 5.6  Hemoglobin 12.0 - 15.0 g/dL 12.3 12.4 12.9  Hematocrit 36.0 - 46.0 % 41.6 38.6 41.2  Platelets 150 - 400 K/uL 350 399 347   Lipid Panel     Component Value Date/Time   CHOL 157 08/30/2018 1212   TRIG 95 08/30/2018 1212   HDL 71 08/30/2018 1212   CHOLHDL 2.2 08/30/2018 1212   CHOLHDL 2.1 04/20/2016 1122   VLDL 9 04/20/2016 1122   LDLCALC 67 08/30/2018 1212   HEMOGLOBIN A1C Lab Results  Component  Value Date   HGBA1C 5.9 (H) 12/17/2018   MPG 120 04/20/2016   TSH Recent Labs    08/30/18 1212  TSH 1.860   ***  Medications and allergies   Allergies  Allergen Reactions  . Kiwi Extract Hives and Itching  . Mucinex Dm [Dm-Guaifenesin Er] Nausea Only    Messes up her stomach. Pt report on 01/21/15  . Peach [Prunus Persica] Itching    Peach peeling makes pt itch     Current Outpatient Medications  Medication Instructions  . aspirin EC 81 mg, Oral, Daily  . cholecalciferol (VITAMIN D3) 1,000 Units, Oral, Daily  . cyclobenzaprine (FLEXERIL) 10 MG tablet TAKE 1 TABLET TWICE A DAY AS NEEDED FOR MUSCLE SPASMS  . Dexilant 60 mg, Oral, Daily  . diclofenac sodium (VOLTAREN) 1 % GEL APPLY 2 GRAMS TO AFFECTED AREA 4 TIMES A DAY  . famotidine (PEPCID) 20 MG tablet TAKE 1 TABLET DAILY AT BEDTIME  . fluticasone (FLONASE) 50 MCG/ACT nasal spray USE ONE SPRAY(S) IN EACH NOSTRIL DAILY AS NEEDED FOR ALLERGIES OR RHINITIS.  Marland Kitchen gabapentin (NEURONTIN) 300 MG capsule TAKE ONE TABLET BY MOUTH IN THE AFTERNOON AND 2 AT BEDTIME  . gabapentin (NEURONTIN) 300 mg, Oral, 3 times daily  . hydrochlorothiazide (HYDRODIURIL) 12.5 mg, Oral, Daily  . hydrocortisone (PROCTOCORT) 1 % CREA 1 application, Topical, Daily  . hydroxychloroquine (PLAQUENIL) 200 mg, Oral, 2 times daily  . linaclotide (LINZESS) 145 mcg, Oral, Daily before breakfast  . loratadine (CLARITIN) 10 mg, Oral, Daily  . losartan-hydrochlorothiazide (HYZAAR) 50-12.5 MG tablet TAKE 1 TABLET DAILY  . meloxicam (MOBIC) 7.5 mg, Oral, 2 times daily  . Misc. Devices (ROLLATOR ULTRA-LIGHT) MISC Use as directed.  . ondansetron (ZOFRAN) 4 MG tablet TAKE ONE TABLET BY MOUTH EVERY 8 HOURS AS NEEDED FOR  NAUSEA  OR  VOMITING  . potassium chloride (K-DUR) 10 MEQ tablet 10 mEq, Oral, 2 times daily  . simvastatin (ZOCOR) 20 MG tablet TAKE 1 TABLET DAILY  . venlafaxine (EFFEXOR) 37.5 mg, Oral, Daily    Radiology:  DG Chest 2 View  Result Date:  04/15/2019 CLINICAL DATA:  59 year old female under preoperative evaluation for left L2-L3 micro discectomy. EXAM: CHEST - 2 VIEW COMPARISON:  Chest x-ray 10/06/2017. FINDINGS: Lung volumes are normal. No consolidative airspace disease. No pleural effusions. No pneumothorax. Small 7 mm nodular density projecting over the left lower lung corresponding 2 calcified granuloma seen on prior CT  the abdomen and pelvis 04/01/2014. No other new suspicious appearing pulmonary nodule or mass noted. Pulmonary vasculature and the cardiomediastinal silhouette are within normal limits. IMPRESSION: 1. No radiographic evidence of acute cardiopulmonary disease. 2. Small left lower lobe calcified granuloma. Electronically Signed   By: Vinnie Langton M.D.   On: 04/15/2019 21:27    Cardiac Studies:   Carotid artery duplex 07-Oct-2014: Findings suggest 1-39% internal carotid artery stenosis bilaterally. Vertebral arteries are patent with antegrade flow.  Echocardiogram 09/30/2018: Normal LV systolic function, moderate tricuspid regurgitation.  PA pressure within normal limits.  Mild mitral regurgitation.  Event monitor and 20/09/2014: Normal sinus rhythm without atrial fibrillation.  Assessment     ICD-10-CM   1. Essential hypertension  I10   2. Pure hypercholesterolemia  E78.00   3. History of TIA (transient ischemic attack) 2016 left arm and face numbness  Z86.73     ***  ***  No orders of the defined types were placed in this encounter.   There are no discontinued medications.   Recommendations:   ***  Adrian Prows, MD, [facility] 04/16/2019, 9:08 AM [facility] [place] Office: [phone]

## 2019-04-16 NOTE — Progress Notes (Signed)
59 year old black female history of L2-3 and L5-S1 HNP/stenosis comes in for preop evaluation.  States that symptoms unchanged from previous visit.  She is wanting to proceed with left L2-3 microdiscectomy and L5-S1 hemilaminectomy as scheduled.  Today history physical performed.  Surgical procedure discussed along with potential hospital stay.  All questions answered.  Patient states that she also had chronic problems with bilateral knee pain.  On exam she does have bilateral patellofemoral crepitus.  Joint line tender bilaterally.  Postop I discussed getting bilateral knee x-rays with her and possible conservative management with intra-articular knee injections.  I asked her to remind me of this later.

## 2019-04-16 NOTE — Telephone Encounter (Signed)
Await note from cardiology

## 2019-04-17 ENCOUNTER — Other Ambulatory Visit: Payer: Self-pay

## 2019-04-17 ENCOUNTER — Ambulatory Visit (INDEPENDENT_AMBULATORY_CARE_PROVIDER_SITE_OTHER): Payer: BC Managed Care – PPO | Admitting: Cardiology

## 2019-04-17 ENCOUNTER — Ambulatory Visit: Payer: BC Managed Care – PPO | Admitting: Cardiology

## 2019-04-17 ENCOUNTER — Ambulatory Visit (INDEPENDENT_AMBULATORY_CARE_PROVIDER_SITE_OTHER): Payer: BC Managed Care – PPO

## 2019-04-17 ENCOUNTER — Encounter: Payer: Self-pay | Admitting: Cardiology

## 2019-04-17 VITALS — BP 134/90 | HR 96 | Ht 69.0 in | Wt 244.0 lb

## 2019-04-17 DIAGNOSIS — R0600 Dyspnea, unspecified: Secondary | ICD-10-CM | POA: Diagnosis not present

## 2019-04-17 DIAGNOSIS — Z01810 Encounter for preprocedural cardiovascular examination: Secondary | ICD-10-CM

## 2019-04-17 DIAGNOSIS — M069 Rheumatoid arthritis, unspecified: Secondary | ICD-10-CM | POA: Diagnosis not present

## 2019-04-17 DIAGNOSIS — I10 Essential (primary) hypertension: Secondary | ICD-10-CM | POA: Diagnosis not present

## 2019-04-17 DIAGNOSIS — M546 Pain in thoracic spine: Secondary | ICD-10-CM | POA: Diagnosis not present

## 2019-04-17 DIAGNOSIS — M79605 Pain in left leg: Secondary | ICD-10-CM | POA: Diagnosis not present

## 2019-04-17 DIAGNOSIS — R0609 Other forms of dyspnea: Secondary | ICD-10-CM

## 2019-04-17 DIAGNOSIS — M545 Low back pain: Secondary | ICD-10-CM | POA: Diagnosis not present

## 2019-04-17 DIAGNOSIS — M79604 Pain in right leg: Secondary | ICD-10-CM | POA: Diagnosis not present

## 2019-04-17 DIAGNOSIS — R9431 Abnormal electrocardiogram [ECG] [EKG]: Secondary | ICD-10-CM

## 2019-04-17 DIAGNOSIS — E785 Hyperlipidemia, unspecified: Secondary | ICD-10-CM

## 2019-04-17 NOTE — Progress Notes (Signed)
Primary Physician:  Carlena Hurl, PA-C   Patient ID: Lisa Briggs, female    DOB: December 14, 1960, 59 y.o.   MRN: [id number]  Subjective:    Chief Complaint  Patient presents with  . Hypertension  . surgery clearance    HPI: Lisa Briggs  is a 59 y.o. female  with seropositive RA (Deveshwar), OA, DDD, HTN, questionable TIA, here for preoperative cardiac risk stratification for upcoming left L2-3 microdiscectomy and L5-S1 hemilaminectomy on 04/22/19 with Dr. Ricard Dillon and Dr. Louanne Skye.  She has history of TIA symptoms that originally started in June 2016 with what appears to be recurrence of symptoms; however, has not had any recent episodes. No focal weakness, slurred speech, or facial drooping. She has been evaluated by Neurology with negative workup.   She does walk with a cane due to back pain and bilateral knee pain; however, is fairly active with PT. States that she swims in the pool twice a week and also does exercises on the table that she tolerates well. She denies any chest pain or dyspnea on exertion. Has chronic leg swelling that is stable with use of support stockings.   Blood pressure is well controlled as well as hyperlipidemia. RA is managed by Dr. Estanislado Pandy. No history of diabetes. She is without history of tobacco use, alcohol use, or illicit drug use.    Past Medical History:  Diagnosis Date  . Allergy   . Anemia    iron therapy for years as of 10/12; normal Hgb 08/2013  . Anxiety   . Arthritis   . Chest pain 04/05/2011   cardiac eval, normal treadmill stress test, Dr. Tollie Eth  . Chronic back pain   . Constipation   . Farsightedness    wears glasses, [facility] care center  . 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 [name], [facility]  . GERD (gastroesophageal reflux disease)   . History of uterine fibroid   . Hyperlipidemia   . Hypertension   . Paresthesia 09/2014   initially thought to be TIA, neurology consult in 12/2014 with  other non TIA considerations.    . Polyarthralgia    normal rheumatoid screen 01/2012  . TIA (transient ischemic attack) 2015/16    Past Surgical History:  Procedure Laterality Date  . COLONOSCOPY  01/2014   diverticulosis, othwerise normal - Dr. Owens Loffler  . ESOPHAGOGASTRODUODENOSCOPY  2013   Dr. Benson Norway, gastritis  . ESOPHAGOGASTRODUODENOSCOPY  [id number]  . EUS N/A 04/16/2014   Procedure: UPPER ENDOSCOPIC ULTRASOUND (EUS) LINEAR;  Surgeon: Milus Banister, MD;  Location: [facility];  Service: Endoscopy;  Laterality: N/A;  . gall stone surgery    . KNEE ARTHROSCOPY Left   . LAPAROSCOPIC GASTRIC RESECTION N/A 06/09/2014   Procedure: LAPAROSCOPIC GASTRIC MASS RESECTION;  Surgeon: [name], MD;  Location: [facility];  Service: General;  Laterality: N/A;  . LIPOMA EXCISION     forehead  . UPPER GASTROINTESTINAL ENDOSCOPY    . UTERINE FIBROID SURGERY      Social History   Socioeconomic History  . Marital status: Married    Spouse name: Not on file  . Number of children: 1  . Years of education: Not on file  . Highest education level: Not on file  Occupational History  . Occupation: IT sales professional: [employer]  Tobacco Use  . Smoking status: Never Smoker  . Smokeless tobacco: Never Used  Substance and Sexual Activity  . Alcohol use: No  Alcohol/week: 0.0 standard drinks  . Drug use: No  . Sexual activity: Not on file  Other Topics Concern  . Not on file  Social History Narrative   Married, has 1 son in [place] and some grandchildren.  Glass blower/designer.  Active on job.  Does stretching and exercises daily as per physical therapy.  Works 12 hours daily.     Social Determinants of Health   Financial Resource Strain:   . Difficulty of Paying Living Expenses: Not on file  Food Insecurity:   . Worried About Charity fundraiser in the Last Year: Not on file  . Ran Out of Food in the Last Year: Not on file  Transportation Needs:   . Lack of Transportation  (Medical): Not on file  . Lack of Transportation (Non-Medical): Not on file  Physical Activity:   . Days of Exercise per Week: Not on file  . Minutes of Exercise per Session: Not on file  Stress:   . Feeling of Stress : Not on file  Social Connections:   . Frequency of Communication with Friends and Family: Not on file  . Frequency of Social Gatherings with Friends and Family: Not on file  . Attends Religious Services: Not on file  . Active Member of Clubs or Organizations: Not on file  . Attends Archivist Meetings: Not on file  . Marital Status: Not on file  Intimate Partner Violence:   . Fear of Current or Ex-Partner: Not on file  . Emotionally Abused: Not on file  . Physically Abused: Not on file  . Sexually Abused: Not on file    Review of Systems  Constitution: Negative for decreased appetite, malaise/fatigue, weight gain and weight loss.  Eyes: Negative for visual disturbance.  Cardiovascular: Positive for leg swelling (chronic; wears compression stockings). Negative for chest pain, claudication, dyspnea on exertion, orthopnea, palpitations and syncope.  Respiratory: Negative for hemoptysis and wheezing.   Endocrine: Negative for cold intolerance and heat intolerance.  Hematologic/Lymphatic: Does not bruise/bleed easily.  Skin: Negative for nail changes.  Musculoskeletal: Positive for back pain and joint pain (OA and RA). Negative for muscle weakness and myalgias.  Gastrointestinal: Negative for abdominal pain, change in bowel habit, nausea and vomiting.  Neurological: Negative for difficulty with concentration, dizziness, focal weakness and headaches.  Psychiatric/Behavioral: Negative for altered mental status and suicidal ideas.  All other systems reviewed and are negative.     Objective:  Blood pressure 134/90, pulse 96, height 5\' 9"  (1.753 m), weight 244 lb (110.7 kg), last menstrual period 02/23/2015, SpO2 97 %. Body mass index is 36.03 kg/m.    Physical  Exam  Constitutional: She is oriented to person, place, and time. Vital signs are normal. She appears well-developed and well-nourished.  HENT:  Head: Normocephalic and atraumatic.  Cardiovascular: Normal rate, regular rhythm, normal heart sounds and intact distal pulses.  Pulmonary/Chest: Effort normal and breath sounds normal. No accessory muscle usage. No respiratory distress.  Abdominal: Soft. Bowel sounds are normal.  Musculoskeletal:        General: Normal range of motion.     Cervical back: Normal range of motion.  Neurological: She is alert and oriented to person, place, and time.  Skin: Skin is warm and dry.  Vitals reviewed.  Radiology: No results found.  Laboratory examination:    CMP Latest Ref Rng & Units 04/15/2019 03/04/2019 10/08/2018  Glucose 70 - 99 mg/dL 81 88 93  BUN 6 - 20 mg/dL 15 17 19  Creatinine 0.44 - 1.00 mg/dL 0.73 0.80 0.75  Sodium 135 - 145 mmol/L 140 142 142  Potassium 3.5 - 5.1 mmol/L 3.8 3.9 4.2  Chloride 98 - 111 mmol/L 102 102 104  CO2 22 - 32 mmol/L 28 30 31   Calcium 8.9 - 10.3 mg/dL 9.7 9.9 9.8  Total Protein 6.5 - 8.1 g/dL 7.3 7.3 7.1  Total Bilirubin 0.3 - 1.2 mg/dL 0.4 0.4 0.4  Alkaline Phos 38 - 126 U/L 114 - -  AST 15 - 41 U/L 18 19 18   ALT 0 - 44 U/L 17 18 16    CBC Latest Ref Rng & Units 04/15/2019 03/04/2019 10/08/2018  WBC 4.0 - 10.5 K/uL 6.5 8.3 5.6  Hemoglobin 12.0 - 15.0 g/dL 12.3 12.4 12.9  Hematocrit 36.0 - 46.0 % 41.6 38.6 41.2  Platelets 150 - 400 K/uL 350 399 347   Lipid Panel     Component Value Date/Time   CHOL 157 08/30/2018 1212   TRIG 95 08/30/2018 1212   HDL 71 08/30/2018 1212   CHOLHDL 2.2 08/30/2018 1212   CHOLHDL 2.1 04/20/2016 1122   VLDL 9 04/20/2016 1122   LDLCALC 67 08/30/2018 1212   HEMOGLOBIN A1C Lab Results  Component Value Date   HGBA1C 5.9 (H) 12/17/2018   MPG 120 04/20/2016   TSH Recent Labs    08/30/18 1212  TSH 1.860    PRN Meds:. Medications Discontinued During This Encounter    Medication Reason  . fluticasone (FLONASE) 50 MCG/ACT nasal spray Error  . gabapentin (NEURONTIN) 300 MG capsule Error  . losartan-hydrochlorothiazide (HYZAAR) 50-12.5 MG tablet Error  . meloxicam (MOBIC) 7.5 MG tablet Error   Current Meds  Medication Sig  . cholecalciferol (VITAMIN D3) 25 MCG (1000 UT) tablet Take 1 tablet (1,000 Units total) by mouth daily.  . cyclobenzaprine (FLEXERIL) 10 MG tablet TAKE 1 TABLET TWICE A DAY AS NEEDED FOR MUSCLE SPASMS (Patient taking differently: Take 10 mg by mouth 2 (two) times daily as needed for muscle spasms. )  . dexlansoprazole (DEXILANT) 60 MG capsule Take 1 capsule (60 mg total) by mouth daily.  . diclofenac sodium (VOLTAREN) 1 % GEL APPLY 2 GRAMS TO AFFECTED AREA 4 TIMES A DAY (Patient taking differently: Apply 2 g topically 4 (four) times daily as needed (pain). )  . famotidine (PEPCID) 20 MG tablet TAKE 1 TABLET DAILY AT BEDTIME (Patient taking differently: Take 20 mg by mouth at bedtime. )  . gabapentin (NEURONTIN) 300 MG capsule Take 1 capsule (300 mg total) by mouth 3 (three) times daily.  . hydrochlorothiazide (HYDRODIURIL) 12.5 MG tablet Take 1 tablet (12.5 mg total) by mouth daily.  . hydrocortisone (PROCTOCORT) 1 % CREA Apply 1 application topically daily.  . hydroxychloroquine (PLAQUENIL) 200 MG tablet Take 1 tablet (200 mg total) by mouth 2 (two) times daily.  Marland Kitchen linaclotide (LINZESS) 145 MCG CAPS capsule Take 1 capsule (145 mcg total) by mouth daily before breakfast.  . loratadine (CLARITIN) 10 MG tablet Take 1 tablet (10 mg total) by mouth daily.  Marland Kitchen losartan (COZAAR) 50 MG tablet Take 50 mg by mouth daily.  . Misc. Devices (ROLLATOR ULTRA-LIGHT) MISC Use as directed.  . ondansetron (ZOFRAN) 4 MG tablet TAKE ONE TABLET BY MOUTH EVERY 8 HOURS AS NEEDED FOR  NAUSEA  OR  VOMITING (Patient taking differently: Take 4 mg by mouth every 8 (eight) hours as needed for nausea or vomiting. TAKE ONE TABLET BY MOUTH EVERY 8 HOURS AS NEEDED FOR   NAUSEA  OR  VOMITING)  . potassium chloride (K-DUR) 10 MEQ tablet Take 1 tablet (10 mEq total) by mouth 2 (two) times daily.  . simvastatin (ZOCOR) 20 MG tablet TAKE 1 TABLET DAILY (Patient taking differently: Take 20 mg by mouth daily. )  . venlafaxine (EFFEXOR) 37.5 MG tablet Take 1 tablet (37.5 mg total) by mouth daily.    Cardiac Studies:   Echo 09/30/2014: Moderate TR, mild MR, otherwise normal study. Normal LVEF  Assessment:   Preoperative cardiovascular examination - upcoming back surgery 04/22/19 with Dr. Jessica Priest - Plan: EKG 12-Lead, PCV ECHOCARDIOGRAM COMPLETE  Abnormal EKG  Primary hypertension  Rheumatoid arthritis, involving unspecified site, unspecified whether rheumatoid factor present ([place])  Mild hyperlipidemia  EKG 04/17/2019: Normal sinus rhythm at 85 bpm, normal axis, LVH. Nonspecific T wave abnormality.   Recommendations:   Patient is here for perioperative cardiac or stratification for upcoming back surgery on 1/19.  She has multiple risk factors including rheumatoid arthritis, hypertension, hyperlipidemia, prediabetes, TIA in 2016, and also questionable family history, but does not appear to be of early age.  She reports having stress testing many years ago that she states was normal.  She has some nonspecific T wave abnormality seen on EKG today compared to previous EKG in 2016.  While her walking is limited due to her back pain, she does participate in physical therapy and swims in the pool regularly that she tolerates well.  No symptoms of angina.  No clinical evidence of heart failure.  I would recommend obtaining echocardiogram to exclude any structural abnormalities given her history of hypertension and slightly abnormal EKG, do not feel that she needs stress testing at this time she is able to participate in physical therapy without any symptoms of angina.  Unless her echocardiogram shows depressed LVEF or other significant abnormalities, she can proceed with  surgery low risk for perioperative cardiac complications.  We will order echocardiogram stat to not delay her procedure.  Her lipids are well controlled with simvastatin.  Blood pressures generally well controlled, although was slightly elevated today.  No changes were made to her medications at this time.  We will plan to see her back on a as needed basis unless echocardiogram is abnormal.   *I have discussed this case with Dr. Virgina Jock and he personally examined the patient and participated in formulating the plan.*   Miquel Dunn, MSN, APRN, FNP-C [facility]. [place] Office: [phone] Fax: [phone]

## 2019-04-18 ENCOUNTER — Other Ambulatory Visit (HOSPITAL_COMMUNITY)
Admission: RE | Admit: 2019-04-18 | Discharge: 2019-04-18 | Disposition: A | Discharge status: Home / Self Care | Payer: BC Managed Care – PPO | Source: Ambulatory Visit | Attending: Specialist | Admitting: Specialist

## 2019-04-18 ENCOUNTER — Other Ambulatory Visit: Payer: BC Managed Care – PPO

## 2019-04-18 DIAGNOSIS — Z01812 Encounter for preprocedural laboratory examination: Secondary | ICD-10-CM | POA: Insufficient documentation

## 2019-04-18 DIAGNOSIS — Z20822 Contact with and (suspected) exposure to covid-19: Secondary | ICD-10-CM | POA: Diagnosis not present

## 2019-04-19 LAB — NOVEL CORONAVIRUS, NAA (HOSP ORDER, SEND-OUT TO REF LAB; TAT 18-24 HRS): SARS-CoV-2, NAA: NOT DETECTED

## 2019-04-20 NOTE — Progress Notes (Signed)
Yes. Moderate risk, but wouldn't necessarily hold the surgery. Repeat echo in 3 months.

## 2019-04-21 ENCOUNTER — Telehealth: Payer: Self-pay | Admitting: Cardiology

## 2019-04-21 ENCOUNTER — Encounter: Payer: Self-pay | Admitting: Cardiology

## 2019-04-21 DIAGNOSIS — I429 Cardiomyopathy, unspecified: Secondary | ICD-10-CM

## 2019-04-21 NOTE — Progress Notes (Signed)
Patient noted to have mildly depressed LVEF by echo. She can proceed with surgery moderate risk for perioperative cardiac complications per Dr. Virgina Jock. Surgical clearance letter sent to Dr. Louanne Skye. Will recommend repeating echocardiogram in 3 months for follow up.

## 2019-04-21 NOTE — Telephone Encounter (Signed)
-----   Message from Nigel Mormon, MD sent at 04/20/2019  9:05 PM EST ----- Yes. Moderate risk, but wouldn't necessarily hold the surgery. Repeat echo in 3 months.

## 2019-04-22 ENCOUNTER — Other Ambulatory Visit (HOSPITAL_COMMUNITY)
Admission: RE | Admit: 2019-04-22 | Discharge: 2019-04-22 | Disposition: A | Discharge status: Home / Self Care | Payer: BC Managed Care – PPO | Source: Ambulatory Visit | Attending: Specialist | Admitting: Specialist

## 2019-04-22 DIAGNOSIS — Z01812 Encounter for preprocedural laboratory examination: Secondary | ICD-10-CM | POA: Insufficient documentation

## 2019-04-22 DIAGNOSIS — Z20822 Contact with and (suspected) exposure to covid-19: Secondary | ICD-10-CM | POA: Insufficient documentation

## 2019-04-22 NOTE — H&P (Signed)
Lisa Briggs is an 59 y.o. female.   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  States that symptoms unchanged from previous visit.  She is wanting to proceed with left L2-3 microdiscectomy and L5-S1 hemilaminectomy as scheduled.    Past Medical History:  Diagnosis Date  . Allergy   . Anemia    iron therapy for years as of 10/12; normal Hgb 08/2013  . Anxiety   . Arthritis   . Chest pain 04/05/2011   cardiac eval, normal treadmill stress test, Dr. Tollie Eth  . Chronic back pain   . Constipation   . Farsightedness    wears glasses, Eye care center  . 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 [name], [facility]  . GERD (gastroesophageal reflux disease)   . History of uterine fibroid   . Hyperlipidemia   . Hypertension   . Paresthesia 09/2014   initially thought to be TIA, neurology consult in 12/2014 with other non TIA considerations.    . Polyarthralgia    normal rheumatoid screen 01/2012  . TIA (transient ischemic attack) 2015/16    Past Surgical History:  Procedure Laterality Date  . COLONOSCOPY  01/2014   diverticulosis, othwerise normal - Dr. Herald Vallin Loffler  . ESOPHAGOGASTRODUODENOSCOPY  2013   Dr. Benson Norway, gastritis  . ESOPHAGOGASTRODUODENOSCOPY  [id number]  . EUS N/A 04/16/2014   Procedure: UPPER ENDOSCOPIC ULTRASOUND (EUS) LINEAR;  Surgeon: Milus Banister, MD;  Location: [facility];  Service: Endoscopy;  Laterality: N/A;  . gall stone surgery    . KNEE ARTHROSCOPY Left   . LAPAROSCOPIC GASTRIC RESECTION N/A 06/09/2014   Procedure: LAPAROSCOPIC GASTRIC MASS RESECTION;  Surgeon: [name], MD;  Location: [facility];  Service: General;  Laterality: N/A;  . LIPOMA EXCISION     forehead  . UPPER GASTROINTESTINAL ENDOSCOPY    . UTERINE FIBROID SURGERY      Family History  Problem Relation Age of Onset  .  Hypertension Mother   . Arthritis Mother   . GER disease Mother   . Glaucoma Mother   . Breast cancer Mother   . Stroke Father   . Breast cancer Sister        breast cancer dx late 70s  . Hypertension Sister   . Colon cancer Brother 70  . Diabetes Brother   . Diabetes Maternal Aunt   . Heart disease Maternal Aunt   . Heart disease Maternal Grandmother   . Heart disease Maternal Grandfather   . Heart disease Maternal Uncle   . Stroke Maternal Uncle   . Leukemia Sister   . Hypertension Sister   . Healthy Son   . Colon polyps Neg Hx   . Esophageal cancer Neg Hx   . Stomach cancer Neg Hx   . Rectal cancer Neg Hx    Social History:  reports that she has never smoked. She has never used smokeless tobacco. She reports that she does not drink alcohol or use drugs.  Allergies:  Allergies  Allergen Reactions  . Kiwi Extract Hives and Itching  . Mucinex Dm [Dm-Guaifenesin Er] Nausea Only    Messes up her stomach. Pt report on 01/21/15  . Peach [Prunus Persica] Itching    Peach peeling makes pt itch    No medications prior to admission.    No results found for this or any previous visit (from the past 48 hour(s)).  No results found.  Review of Systems  Constitutional: Negative.   HENT: Negative.   Respiratory: Negative.   Gastrointestinal: Negative.   Genitourinary: Negative.   Musculoskeletal: Positive for back pain.  Skin: Negative.   Neurological: Positive for numbness.  Psychiatric/Behavioral: Negative.     Last menstrual period 02/23/2015. Physical Exam  Constitutional: She is oriented to person, place, and time. She appears well-developed. No distress.  HENT:  Head: Normocephalic and atraumatic.  Eyes: Pupils are equal, round, and reactive to light. EOM are normal.  Cardiovascular: Normal heart sounds.  Respiratory: Effort normal and breath sounds normal. No respiratory distress.  GI: Soft. Bowel sounds are normal. She exhibits no distension. There is no  abdominal tenderness.  Musculoskeletal:        General: Tenderness present.     Cervical back: Normal range of motion.  Neurological: She is alert and oriented to person, place, and time.  Skin: Skin is warm and dry.  Psychiatric: She has a normal mood and affect.     Assessment/Plan Low back pain and lower extremity radiculopathy.  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  Surgical procedure discussed in detail.  All questions answered.Benjiman Core, PA-C 04/22/2019, 2:32 PM

## 2019-04-22 NOTE — Telephone Encounter (Signed)
Patient is aware and I have transferred call to front desk Lisa Briggs) to schedule that appt.

## 2019-04-23 ENCOUNTER — Other Ambulatory Visit: Payer: Self-pay

## 2019-04-23 ENCOUNTER — Telehealth: Payer: Self-pay | Admitting: Medical

## 2019-04-23 ENCOUNTER — Encounter: Payer: Self-pay | Admitting: Medical

## 2019-04-23 LAB — NOVEL CORONAVIRUS, NAA (HOSP ORDER, SEND-OUT TO REF LAB; TAT 18-24 HRS): SARS-CoV-2, NAA: NOT DETECTED

## 2019-04-23 NOTE — Telephone Encounter (Signed)
I will give the form to you to send to them

## 2019-04-23 NOTE — Telephone Encounter (Signed)
Please call over [facility] cardiology and verify have they cleared her for surgery.  I received an ultrasound note but I do not see an actual clearance for surgery.  I still have the surgery clearance form waiting clearance from cardiology.

## 2019-04-23 NOTE — Telephone Encounter (Signed)
Cardiology stated they need the clearance form.

## 2019-04-23 NOTE — Progress Notes (Signed)
Spoke with patient and confirmed that surgery date has been moved to 04/25/2019 at 1230 and the patient's new arrival time is 1030.  Reiterated to patient nothing to eat after midnight the night before her surgery, but can have clear liquids listed on her instructions given at pre-op up to 0930 the morning of her surgery.  I also explained that the Pre-Surgery Ensure must be the last thing she drinks the morning of surgery and that it must be completed by 0930 the morning of her surgery.  Patient stated she could not take her medications on an empty stomach and was advised to hold medications as she could not eat anything the day of surgery.  Patient acknowledged understanding of the updated times and instructions.

## 2019-04-25 ENCOUNTER — Inpatient Hospital Stay (HOSPITAL_COMMUNITY): Payer: BC Managed Care – PPO

## 2019-04-25 ENCOUNTER — Inpatient Hospital Stay (HOSPITAL_COMMUNITY)
Admission: RE | Admit: 2019-04-25 | Discharge: 2019-04-26 | DRG: 520 | Disposition: A | Discharge status: Home / Self Care | Payer: BC Managed Care – PPO | Attending: Specialist | Admitting: Specialist

## 2019-04-25 ENCOUNTER — Inpatient Hospital Stay (HOSPITAL_COMMUNITY)
Admission: RE | Disposition: A | Discharge status: Home / Self Care | Payer: Self-pay | Source: Home / Self Care | Attending: Specialist

## 2019-04-25 ENCOUNTER — Other Ambulatory Visit: Payer: Self-pay

## 2019-04-25 ENCOUNTER — Encounter (HOSPITAL_COMMUNITY): Payer: Self-pay | Admitting: Specialist

## 2019-04-25 ENCOUNTER — Inpatient Hospital Stay (HOSPITAL_COMMUNITY): Payer: BC Managed Care – PPO | Admitting: Certified Registered"

## 2019-04-25 DIAGNOSIS — M4727 Other spondylosis with radiculopathy, lumbosacral region: Secondary | ICD-10-CM | POA: Diagnosis not present

## 2019-04-25 DIAGNOSIS — Z8261 Family history of arthritis: Secondary | ICD-10-CM | POA: Diagnosis not present

## 2019-04-25 DIAGNOSIS — Z800 Family history of malignant neoplasm of digestive organs: Secondary | ICD-10-CM

## 2019-04-25 DIAGNOSIS — Z806 Family history of leukemia: Secondary | ICD-10-CM

## 2019-04-25 DIAGNOSIS — Z833 Family history of diabetes mellitus: Secondary | ICD-10-CM

## 2019-04-25 DIAGNOSIS — Z91018 Allergy to other foods: Secondary | ICD-10-CM | POA: Diagnosis not present

## 2019-04-25 DIAGNOSIS — M5117 Intervertebral disc disorders with radiculopathy, lumbosacral region: Secondary | ICD-10-CM | POA: Diagnosis present

## 2019-04-25 DIAGNOSIS — Z823 Family history of stroke: Secondary | ICD-10-CM

## 2019-04-25 DIAGNOSIS — I10 Essential (primary) hypertension: Secondary | ICD-10-CM | POA: Diagnosis present

## 2019-04-25 DIAGNOSIS — M47816 Spondylosis without myelopathy or radiculopathy, lumbar region: Secondary | ICD-10-CM | POA: Diagnosis not present

## 2019-04-25 DIAGNOSIS — Z20822 Contact with and (suspected) exposure to covid-19: Secondary | ICD-10-CM | POA: Diagnosis present

## 2019-04-25 DIAGNOSIS — Z83511 Family history of glaucoma: Secondary | ICD-10-CM

## 2019-04-25 DIAGNOSIS — K219 Gastro-esophageal reflux disease without esophagitis: Secondary | ICD-10-CM | POA: Diagnosis present

## 2019-04-25 DIAGNOSIS — M2578 Osteophyte, vertebrae: Secondary | ICD-10-CM | POA: Diagnosis present

## 2019-04-25 DIAGNOSIS — M5116 Intervertebral disc disorders with radiculopathy, lumbar region: Secondary | ICD-10-CM | POA: Diagnosis not present

## 2019-04-25 DIAGNOSIS — M48061 Spinal stenosis, lumbar region without neurogenic claudication: Secondary | ICD-10-CM | POA: Diagnosis present

## 2019-04-25 DIAGNOSIS — M48062 Spinal stenosis, lumbar region with neurogenic claudication: Secondary | ICD-10-CM

## 2019-04-25 DIAGNOSIS — Z9889 Other specified postprocedural states: Secondary | ICD-10-CM

## 2019-04-25 DIAGNOSIS — M4807 Spinal stenosis, lumbosacral region: Secondary | ICD-10-CM | POA: Diagnosis present

## 2019-04-25 DIAGNOSIS — Z803 Family history of malignant neoplasm of breast: Secondary | ICD-10-CM | POA: Diagnosis not present

## 2019-04-25 DIAGNOSIS — Z419 Encounter for procedure for purposes other than remedying health state, unspecified: Secondary | ICD-10-CM

## 2019-04-25 DIAGNOSIS — D649 Anemia, unspecified: Secondary | ICD-10-CM | POA: Diagnosis present

## 2019-04-25 DIAGNOSIS — Z888 Allergy status to other drugs, medicaments and biological substances status: Secondary | ICD-10-CM

## 2019-04-25 DIAGNOSIS — R531 Weakness: Secondary | ICD-10-CM | POA: Diagnosis not present

## 2019-04-25 DIAGNOSIS — Z8249 Family history of ischemic heart disease and other diseases of the circulatory system: Secondary | ICD-10-CM

## 2019-04-25 DIAGNOSIS — Z981 Arthrodesis status: Secondary | ICD-10-CM | POA: Diagnosis not present

## 2019-04-25 HISTORY — PX: LUMBAR LAMINECTOMY: SHX95

## 2019-04-25 SURGERY — MICRODISCECTOMY LUMBAR LAMINECTOMY
Anesthesia: General

## 2019-04-25 MED ORDER — SODIUM CHLORIDE 0.9 % IV SOLN: INTRAVENOUS | Status: DC

## 2019-04-25 MED ORDER — SODIUM CHLORIDE 0.9% FLUSH: 3.0000 mL | INTRAVENOUS | Status: DC | PRN

## 2019-04-25 MED ORDER — PHENYLEPHRINE 40 MCG/ML (10ML) SYRINGE FOR IV PUSH (FOR BLOOD PRESSURE SUPPORT)
PREFILLED_SYRINGE | INTRAVENOUS | Status: DC | PRN
  Administered 2019-04-25: 200 ug via INTRAVENOUS
  Administered 2019-04-25: 120 ug via INTRAVENOUS

## 2019-04-25 MED ORDER — 0.9 % SODIUM CHLORIDE (POUR BTL) OPTIME
TOPICAL | Status: DC | PRN
  Administered 2019-04-25: 1000 mL

## 2019-04-25 MED ORDER — PHENYLEPHRINE HCL-NACL 10-0.9 MG/250ML-% IV SOLN
INTRAVENOUS | Status: DC | PRN
  Administered 2019-04-25: 40 ug/min via INTRAVENOUS

## 2019-04-25 MED ORDER — LORATADINE 10 MG PO TABS: 10.0000 mg | ORAL_TABLET | Freq: Every day | ORAL | Status: DC

## 2019-04-25 MED ORDER — SUCCINYLCHOLINE CHLORIDE 200 MG/10ML IV SOSY
PREFILLED_SYRINGE | INTRAVENOUS | Status: DC | PRN
  Administered 2019-04-25: 140 mg via INTRAVENOUS

## 2019-04-25 MED ORDER — CHLORHEXIDINE GLUCONATE 4 % EX LIQD: 60.0000 mL | Freq: Once | CUTANEOUS | Status: DC

## 2019-04-25 MED ORDER — HYDROCODONE-ACETAMINOPHEN 10-325 MG PO TABS: 1.0000 | ORAL_TABLET | ORAL | Status: DC | PRN

## 2019-04-25 MED ORDER — ONDANSETRON HCL 4 MG PO TABS: 4.0000 mg | ORAL_TABLET | Freq: Three times a day (TID) | ORAL | Status: DC | PRN

## 2019-04-25 MED ORDER — PHENOL 1.4 % MT LIQD: 1.0000 | OROMUCOSAL | Status: DC | PRN

## 2019-04-25 MED ORDER — CEFAZOLIN SODIUM-DEXTROSE 2-4 GM/100ML-% IV SOLN
2.0000 g | Freq: Three times a day (TID) | INTRAVENOUS | Status: AC
  Administered 2019-04-25 – 2019-04-26 (×2): 2 g via INTRAVENOUS
  Filled 2019-04-25 (×2): qty 100

## 2019-04-25 MED ORDER — DEXAMETHASONE SODIUM PHOSPHATE 10 MG/ML IJ SOLN
INTRAMUSCULAR | Status: DC | PRN
  Administered 2019-04-25: 10 mg via INTRAVENOUS

## 2019-04-25 MED ORDER — CYCLOBENZAPRINE HCL 10 MG PO TABS: 10.0000 mg | ORAL_TABLET | Freq: Two times a day (BID) | ORAL | Status: DC | PRN

## 2019-04-25 MED ORDER — FAMOTIDINE 20 MG PO TABS: 20.0000 mg | ORAL_TABLET | Freq: Every day | ORAL | Status: DC

## 2019-04-25 MED ORDER — MIDAZOLAM HCL 2 MG/2ML IJ SOLN
INTRAMUSCULAR | Status: AC
  Filled 2019-04-25: qty 2

## 2019-04-25 MED ORDER — LIDOCAINE 2% (20 MG/ML) 5 ML SYRINGE
INTRAMUSCULAR | Status: DC | PRN
  Administered 2019-04-25: 60 mg via INTRAVENOUS

## 2019-04-25 MED ORDER — FENTANYL CITRATE (PF) 100 MCG/2ML IJ SOLN
INTRAMUSCULAR | Status: DC | PRN
  Administered 2019-04-25: 100 ug via INTRAVENOUS
  Administered 2019-04-25 (×3): 50 ug via INTRAVENOUS

## 2019-04-25 MED ORDER — PROPOFOL 10 MG/ML IV BOLUS
INTRAVENOUS | Status: AC
  Filled 2019-04-25: qty 20

## 2019-04-25 MED ORDER — ROCURONIUM BROMIDE 10 MG/ML (PF) SYRINGE
PREFILLED_SYRINGE | INTRAVENOUS | Status: AC
  Filled 2019-04-25: qty 10

## 2019-04-25 MED ORDER — CEFAZOLIN SODIUM-DEXTROSE 2-4 GM/100ML-% IV SOLN
2.0000 g | INTRAVENOUS | Status: AC
  Administered 2019-04-25: 2 g via INTRAVENOUS
  Filled 2019-04-25: qty 100

## 2019-04-25 MED ORDER — FLEET ENEMA 7-19 GM/118ML RE ENEM: 1.0000 | ENEMA | Freq: Once | RECTAL | Status: DC | PRN

## 2019-04-25 MED ORDER — VENLAFAXINE HCL 37.5 MG PO TABS
37.5000 mg | ORAL_TABLET | Freq: Every day | ORAL | Status: DC
  Filled 2019-04-25 (×2): qty 1

## 2019-04-25 MED ORDER — ACETAMINOPHEN 650 MG RE SUPP: 650.0000 mg | RECTAL | Status: DC | PRN

## 2019-04-25 MED ORDER — CEFAZOLIN SODIUM 1 G IJ SOLR
INTRAMUSCULAR | Status: AC
  Filled 2019-04-25: qty 20

## 2019-04-25 MED ORDER — THROMBIN 20000 UNITS EX SOLR
CUTANEOUS | Status: DC | PRN
  Administered 2019-04-25: 20000 [IU] via TOPICAL

## 2019-04-25 MED ORDER — POTASSIUM CHLORIDE CRYS ER 20 MEQ PO TBCR: 10.0000 meq | EXTENDED_RELEASE_TABLET | Freq: Two times a day (BID) | ORAL | Status: DC

## 2019-04-25 MED ORDER — EPHEDRINE 5 MG/ML INJ
INTRAVENOUS | Status: AC
  Filled 2019-04-25: qty 10

## 2019-04-25 MED ORDER — ASPIRIN EC 81 MG PO TBEC: 81.0000 mg | DELAYED_RELEASE_TABLET | Freq: Every day | ORAL | Status: DC

## 2019-04-25 MED ORDER — HYDROCORTISONE (PERIANAL) 2.5 % EX CREA
TOPICAL_CREAM | Freq: Every day | CUTANEOUS | Status: DC
  Filled 2019-04-25: qty 28.35

## 2019-04-25 MED ORDER — ONDANSETRON HCL 4 MG/2ML IJ SOLN
INTRAMUSCULAR | Status: DC | PRN
  Administered 2019-04-25: 4 mg via INTRAVENOUS

## 2019-04-25 MED ORDER — ONDANSETRON HCL 4 MG/2ML IJ SOLN
4.0000 mg | Freq: Once | INTRAMUSCULAR | Status: AC | PRN
  Administered 2019-04-25: 4 mg via INTRAVENOUS

## 2019-04-25 MED ORDER — SUGAMMADEX SODIUM 200 MG/2ML IV SOLN
INTRAVENOUS | Status: DC | PRN
  Administered 2019-04-25: 180 mg via INTRAVENOUS

## 2019-04-25 MED ORDER — LACTATED RINGERS IV SOLN: INTRAVENOUS | Status: DC

## 2019-04-25 MED ORDER — LIDOCAINE 2% (20 MG/ML) 5 ML SYRINGE
INTRAMUSCULAR | Status: AC
  Filled 2019-04-25: qty 5

## 2019-04-25 MED ORDER — ONDANSETRON HCL 4 MG/2ML IJ SOLN
INTRAMUSCULAR | Status: AC
  Filled 2019-04-25: qty 2

## 2019-04-25 MED ORDER — METHOCARBAMOL 1000 MG/10ML IJ SOLN
500.0000 mg | Freq: Four times a day (QID) | INTRAVENOUS | Status: DC | PRN
  Filled 2019-04-25: qty 5

## 2019-04-25 MED ORDER — ONDANSETRON HCL 4 MG/2ML IJ SOLN
INTRAMUSCULAR | Status: AC
  Filled 2019-04-25: qty 6

## 2019-04-25 MED ORDER — HYDROXYCHLOROQUINE SULFATE 200 MG PO TABS
200.0000 mg | ORAL_TABLET | Freq: Two times a day (BID) | ORAL | Status: DC
  Filled 2019-04-25 (×2): qty 1

## 2019-04-25 MED ORDER — GLYCOPYRROLATE PF 0.2 MG/ML IJ SOSY
PREFILLED_SYRINGE | INTRAMUSCULAR | Status: DC | PRN
  Administered 2019-04-25 (×2): .1 mg via INTRAVENOUS

## 2019-04-25 MED ORDER — LOSARTAN POTASSIUM 50 MG PO TABS
50.0000 mg | ORAL_TABLET | Freq: Every day | ORAL | Status: DC
  Administered 2019-04-25: 50 mg via ORAL
  Filled 2019-04-25: qty 1

## 2019-04-25 MED ORDER — METHOCARBAMOL 500 MG PO TABS
500.0000 mg | ORAL_TABLET | Freq: Four times a day (QID) | ORAL | Status: DC | PRN
  Administered 2019-04-26: 500 mg via ORAL
  Filled 2019-04-25: qty 1

## 2019-04-25 MED ORDER — HYDROCHLOROTHIAZIDE 25 MG PO TABS: 12.5000 mg | ORAL_TABLET | Freq: Every day | ORAL | Status: DC

## 2019-04-25 MED ORDER — HYDROMORPHONE HCL 1 MG/ML IJ SOLN
0.2500 mg | INTRAMUSCULAR | Status: DC | PRN
  Administered 2019-04-25 (×2): 0.5 mg via INTRAVENOUS

## 2019-04-25 MED ORDER — BISACODYL 5 MG PO TBEC: 5.0000 mg | DELAYED_RELEASE_TABLET | Freq: Every day | ORAL | Status: DC | PRN

## 2019-04-25 MED ORDER — HYDROXYZINE HCL 50 MG/ML IM SOLN: 50.0000 mg | Freq: Four times a day (QID) | INTRAMUSCULAR | Status: DC | PRN

## 2019-04-25 MED ORDER — BUPIVACAINE HCL (PF) 0.5 % IJ SOLN
INTRAMUSCULAR | Status: AC
  Filled 2019-04-25: qty 30

## 2019-04-25 MED ORDER — MORPHINE SULFATE (PF) 2 MG/ML IV SOLN: 1.0000 mg | INTRAVENOUS | Status: DC | PRN

## 2019-04-25 MED ORDER — GABAPENTIN 300 MG PO CAPS
300.0000 mg | ORAL_CAPSULE | Freq: Three times a day (TID) | ORAL | Status: DC
  Administered 2019-04-25: 300 mg via ORAL
  Filled 2019-04-25: qty 1

## 2019-04-25 MED ORDER — ZOLPIDEM TARTRATE 5 MG PO TABS: 5.0000 mg | ORAL_TABLET | Freq: Every evening | ORAL | Status: DC | PRN

## 2019-04-25 MED ORDER — SIMVASTATIN 20 MG PO TABS: 20.0000 mg | ORAL_TABLET | Freq: Every day | ORAL | Status: DC

## 2019-04-25 MED ORDER — ALUM & MAG HYDROXIDE-SIMETH 200-200-20 MG/5ML PO SUSP: 30.0000 mL | Freq: Four times a day (QID) | ORAL | Status: DC | PRN

## 2019-04-25 MED ORDER — HYDROCODONE-ACETAMINOPHEN 7.5-325 MG PO TABS
1.0000 | ORAL_TABLET | Freq: Four times a day (QID) | ORAL | Status: DC
  Administered 2019-04-25: 1 via ORAL
  Filled 2019-04-25: qty 1

## 2019-04-25 MED ORDER — MENTHOL 3 MG MT LOZG: 1.0000 | LOZENGE | OROMUCOSAL | Status: DC | PRN

## 2019-04-25 MED ORDER — PANTOPRAZOLE SODIUM 40 MG PO TBEC: 40.0000 mg | DELAYED_RELEASE_TABLET | Freq: Every day | ORAL | Status: DC

## 2019-04-25 MED ORDER — BUPIVACAINE LIPOSOME 1.3 % IJ SUSP
INTRAMUSCULAR | Status: DC | PRN
  Administered 2019-04-25: 20 mL

## 2019-04-25 MED ORDER — PROPOFOL 10 MG/ML IV BOLUS
INTRAVENOUS | Status: DC | PRN
  Administered 2019-04-25: 140 mg via INTRAVENOUS
  Administered 2019-04-25: 20 mg via INTRAVENOUS

## 2019-04-25 MED ORDER — FENTANYL CITRATE (PF) 250 MCG/5ML IJ SOLN
INTRAMUSCULAR | Status: AC
  Filled 2019-04-25: qty 5

## 2019-04-25 MED ORDER — MEPERIDINE HCL 25 MG/ML IJ SOLN: 6.2500 mg | INTRAMUSCULAR | Status: DC | PRN

## 2019-04-25 MED ORDER — VITAMIN D 25 MCG (1000 UNIT) PO TABS: 1000.0000 [IU] | ORAL_TABLET | Freq: Every day | ORAL | Status: DC

## 2019-04-25 MED ORDER — ACETAMINOPHEN 325 MG PO TABS: 650.0000 mg | ORAL_TABLET | ORAL | Status: DC | PRN

## 2019-04-25 MED ORDER — MIDAZOLAM HCL 5 MG/5ML IJ SOLN
INTRAMUSCULAR | Status: DC | PRN
  Administered 2019-04-25: 1 mg via INTRAVENOUS

## 2019-04-25 MED ORDER — DEXAMETHASONE SODIUM PHOSPHATE 10 MG/ML IJ SOLN
INTRAMUSCULAR | Status: AC
  Filled 2019-04-25: qty 2

## 2019-04-25 MED ORDER — DOCUSATE SODIUM 100 MG PO CAPS: 100.0000 mg | ORAL_CAPSULE | Freq: Two times a day (BID) | ORAL | Status: DC

## 2019-04-25 MED ORDER — PHENYLEPHRINE 40 MCG/ML (10ML) SYRINGE FOR IV PUSH (FOR BLOOD PRESSURE SUPPORT)
PREFILLED_SYRINGE | INTRAVENOUS | Status: AC
  Filled 2019-04-25: qty 20

## 2019-04-25 MED ORDER — HYDROCORTISONE 1 % RE CREA: 1.0000 "application " | TOPICAL_CREAM | Freq: Every day | RECTAL | Status: DC

## 2019-04-25 MED ORDER — ESMOLOL HCL 100 MG/10ML IV SOLN
INTRAVENOUS | Status: AC
  Filled 2019-04-25: qty 10

## 2019-04-25 MED ORDER — HYDROCODONE-ACETAMINOPHEN 10-325 MG PO TABS
2.0000 | ORAL_TABLET | ORAL | Status: DC | PRN
  Administered 2019-04-26: 2 via ORAL
  Filled 2019-04-25 (×2): qty 2

## 2019-04-25 MED ORDER — PROPOFOL 1000 MG/100ML IV EMUL
INTRAVENOUS | Status: AC
  Filled 2019-04-25: qty 100

## 2019-04-25 MED ORDER — ROCURONIUM BROMIDE 10 MG/ML (PF) SYRINGE
PREFILLED_SYRINGE | INTRAVENOUS | Status: DC | PRN
  Administered 2019-04-25: 60 mg via INTRAVENOUS
  Administered 2019-04-25: 20 mg via INTRAVENOUS
  Administered 2019-04-25: 10 mg via INTRAVENOUS

## 2019-04-25 MED ORDER — ONDANSETRON HCL 4 MG/2ML IJ SOLN
4.0000 mg | Freq: Four times a day (QID) | INTRAMUSCULAR | Status: DC | PRN
  Filled 2019-04-25: qty 2

## 2019-04-25 MED ORDER — SODIUM CHLORIDE 0.9% FLUSH: 3.0000 mL | Freq: Two times a day (BID) | INTRAVENOUS | Status: DC

## 2019-04-25 MED ORDER — POLYETHYLENE GLYCOL 3350 17 G PO PACK: 17.0000 g | PACK | Freq: Every day | ORAL | Status: DC | PRN

## 2019-04-25 MED ORDER — BUPIVACAINE HCL 0.5 % IJ SOLN
INTRAMUSCULAR | Status: DC | PRN
  Administered 2019-04-25: 20 mL

## 2019-04-25 MED ORDER — LINACLOTIDE 145 MCG PO CAPS
145.0000 ug | ORAL_CAPSULE | Freq: Every day | ORAL | Status: DC
  Filled 2019-04-25: qty 1

## 2019-04-25 MED ORDER — SUCCINYLCHOLINE CHLORIDE 200 MG/10ML IV SOSY
PREFILLED_SYRINGE | INTRAVENOUS | Status: AC
  Filled 2019-04-25: qty 10

## 2019-04-25 MED ORDER — HYDROMORPHONE HCL 1 MG/ML IJ SOLN
INTRAMUSCULAR | Status: AC
  Filled 2019-04-25: qty 1

## 2019-04-25 MED ORDER — THROMBIN (RECOMBINANT) 20000 UNITS EX SOLR
CUTANEOUS | Status: AC
  Filled 2019-04-25: qty 20000

## 2019-04-25 MED ORDER — ONDANSETRON HCL 4 MG PO TABS
4.0000 mg | ORAL_TABLET | Freq: Four times a day (QID) | ORAL | Status: DC | PRN
  Administered 2019-04-25 – 2019-04-26 (×2): 4 mg via ORAL
  Filled 2019-04-25 (×2): qty 1

## 2019-04-25 MED ORDER — SODIUM CHLORIDE 0.9 % IV SOLN: 250.0000 mL | INTRAVENOUS | Status: DC

## 2019-04-25 SURGICAL SUPPLY — 54 items
BENZOIN TINCTURE PRP APPL 2/3 (GAUZE/BANDAGES/DRESSINGS) ×2 IMPLANT
BUR SABER RD CUTTING 3.0 (BURR) ×4 IMPLANT
CANISTER SUCT 3000ML PPV (MISCELLANEOUS) ×2 IMPLANT
CLSR STERI-STRIP ANTIMIC 1/2X4 (GAUZE/BANDAGES/DRESSINGS) ×2 IMPLANT
COVER MAYO STAND STRL (DRAPES) ×2 IMPLANT
COVER SURGICAL LIGHT HANDLE (MISCELLANEOUS) ×2 IMPLANT
DRAPE C-ARM 42X72 X-RAY (DRAPES) IMPLANT
DRAPE HALF SHEET 40X57 (DRAPES) IMPLANT
DRAPE MICROSCOPE LEICA (MISCELLANEOUS) ×2 IMPLANT
DRAPE SURG 17X23 STRL (DRAPES) ×8 IMPLANT
DRSG MEPILEX BORDER 4X4 (GAUZE/BANDAGES/DRESSINGS) IMPLANT
DRSG MEPILEX BORDER 4X8 (GAUZE/BANDAGES/DRESSINGS) ×2 IMPLANT
DURAPREP 26ML APPLICATOR (WOUND CARE) ×2 IMPLANT
ELECT BLADE 4.0 EZ CLEAN MEGAD (MISCELLANEOUS) ×2
ELECT CAUTERY BLADE 6.4 (BLADE) ×2 IMPLANT
ELECT REM PT RETURN 9FT ADLT (ELECTROSURGICAL) ×2
ELECTRODE BLDE 4.0 EZ CLN MEGD (MISCELLANEOUS) ×1 IMPLANT
ELECTRODE REM PT RTRN 9FT ADLT (ELECTROSURGICAL) ×1 IMPLANT
GLOVE BIO SURGEON STRL SZ 6.5 (GLOVE) ×2 IMPLANT
GLOVE BIOGEL PI IND STRL 7.5 (GLOVE) ×1 IMPLANT
GLOVE BIOGEL PI IND STRL 8 (GLOVE) ×1 IMPLANT
GLOVE BIOGEL PI INDICATOR 7.5 (GLOVE) ×1
GLOVE BIOGEL PI INDICATOR 8 (GLOVE) ×1
GLOVE ECLIPSE 8.5 STRL (GLOVE) ×2 IMPLANT
GLOVE ORTHO TXT STRL SZ7.5 (GLOVE) ×4 IMPLANT
GLOVE SURG 8.5 LATEX PF (GLOVE) ×4 IMPLANT
GOWN STRL REUS W/ TWL LRG LVL3 (GOWN DISPOSABLE) ×1 IMPLANT
GOWN STRL REUS W/TWL 2XL LVL3 (GOWN DISPOSABLE) ×4 IMPLANT
GOWN STRL REUS W/TWL LRG LVL3 (GOWN DISPOSABLE) ×1
KIT BASIN OR (CUSTOM PROCEDURE TRAY) ×2 IMPLANT
KIT TURNOVER KIT B (KITS) ×2 IMPLANT
NEEDLE SPNL 18GX3.5 QUINCKE PK (NEEDLE) ×4 IMPLANT
NS IRRIG 1000ML POUR BTL (IV SOLUTION) ×2 IMPLANT
PACK LAMINECTOMY ORTHO (CUSTOM PROCEDURE TRAY) ×2 IMPLANT
PAD ARMBOARD 7.5X6 YLW CONV (MISCELLANEOUS) ×4 IMPLANT
PATTIES SURGICAL .5 X.5 (GAUZE/BANDAGES/DRESSINGS) IMPLANT
PATTIES SURGICAL .75X.75 (GAUZE/BANDAGES/DRESSINGS) IMPLANT
SPONGE LAP 4X18 RFD (DISPOSABLE) IMPLANT
SPONGE SURGIFOAM ABS GEL 100 (HEMOSTASIS) ×2 IMPLANT
STAPLER VISISTAT 35W (STAPLE) ×2 IMPLANT
STRIP CLOSURE SKIN 1/2X4 (GAUZE/BANDAGES/DRESSINGS) ×2 IMPLANT
SUT VIC AB 0 CT1 27 (SUTURE) ×1
SUT VIC AB 0 CT1 27XBRD ANBCTR (SUTURE) ×1 IMPLANT
SUT VIC AB 1 CTX 36 (SUTURE) ×2
SUT VIC AB 1 CTX36XBRD ANBCTR (SUTURE) ×2 IMPLANT
SUT VIC AB 2-0 CT1 27 (SUTURE) ×1
SUT VIC AB 2-0 CT1 TAPERPNT 27 (SUTURE) ×1 IMPLANT
SUT VIC AB 3-0 X1 27 (SUTURE) ×2 IMPLANT
SUT VICRYL 0 UR6 27IN ABS (SUTURE) ×2 IMPLANT
SYR 20ML LL LF (SYRINGE) ×2 IMPLANT
SYR CONTROL 10ML LL (SYRINGE) ×2 IMPLANT
TOWEL GREEN STERILE (TOWEL DISPOSABLE) ×2 IMPLANT
TOWEL GREEN STERILE FF (TOWEL DISPOSABLE) ×2 IMPLANT
WATER STERILE IRR 1000ML POUR (IV SOLUTION) ×2 IMPLANT

## 2019-04-25 NOTE — Op Note (Signed)
04/25/2019  5:27 PM  PATIENT:  Lisa Briggs  59 y.o. female  MRN: [id number]  OPERATIVE REPORT  PRE-OPERATIVE DIAGNOSIS:  L2-3 left herniated disc, bilateral L5-S1 spondylosis with lateral recess stenosis  POST-OPERATIVE DIAGNOSIS:  L2-3 left herniated disc, bilateral L5-S1 spondylosis with lateral recess stenosis  PROCEDURE:  Procedure(s): LEFT L2-3 MICRODISCECTOMY, BILATERAL L5-S1 PARTIAL HEMILAMINECTOMY    SURGEON:  Jessy Oto, MD     ASSISTANT:  Benjiman Core, PA-C  (Present throughout the entire procedure and necessary for completion of procedure in a timely manner)     ANESTHESIA:  General, supplemented with local marcaine 0.5% 1:1 exparel 1.3% total 40CC. Dr. Tobias Alexander.     COMPLICATIONS:  None.  EBL: 80CC  DRAINS: Foley removed at the end of the case.       PROCEDURE:The patient was met in the holding area, and the appropriate Left Lumbar level L2-3 and bilateral L5-S1 identified and marked with "x" and my initials.The patient was then transported to OR and was placed under general anesthesia without difficulty. The patient received appropriate preoperative antibiotic prophylaxis. Foley catheter places sterilely after intubation atraumatically and the patient was transferred to the operating room table, prone position, [employer] table. All pressure points were well padded. The arms in 90-90 well-padded at the elbows. Standard prep with iodoform solution lower dorsal spine to the mid sacral segment. Draped in the usual manner clear Vi-Drape was used. Time-out procedure was called and correct. 2x 18-gauge spinal needle was then inserted at the expected L3 and L5 level. Cross table lateral radiograph was d used to identify the spinal needles positions. The lower needle was at the lower aspect of the lamina of L4-5. Skin inferior to this was then infiltrated with Marcaine half percent 1:1 Exparel 1.3% total of 25 cc used. The skin superior to the upper needle which was localized to  the L3 level then infiltrated with marcaine half percent 1:1 Exparel 1.3% total of 10 cc used. An incision approximately an inch inch and a half in length was then made through skin and subcutaneous layers in line with the left side of the expected midline just superior to the spinal needle entry point at the L3 level. And an incision was made in the midline at the expected L5-S1 level.  The upper incision made into the left lumbosacral fascia approximately an inch in length .  Cobb elevator then introduced into the incision site and used to carefully form subperiosteal dissection of the paralumbar muscles off of the posterior lamina of the expected left L2-3 level. The lower incision carried down to the lumbodorsal fascia. The fascia incised on both sides of the expected spinous process of L5 and S1. Kocher clamps placed on the spinous processes at each level and cross table lateral radiograph demonstrated the upper clamp at the spinous process of L2 and the lower clamp at the spinous process of L5. These spinous processes then marked with an OR marking pen and a O vicryl suture for continued identification of these areas. The depth measured off of the Cobbs at L2-3 about 70 mm and 70 mm retractors then placed on the scaffolding for the [facility] equipment and guided down to and docking on the posterior aspect of the lamina at the expected left L2-3 level. This was sterilely attached to the articulating arm and it's up right which had been attached the OR table sterilely. The operating room microscope sterilely draped brought into the field. Under the operating room microscope, the  left L2-3  interspace carefully debrided the small amount of muscle attachment here and high-speed bur used to drill the medial aspect of the inferior articular process of L2 approximately 20%. 2 mm Kerrison then used to enter the spinal canal over the superior aspect of the L3 lamina carefully using the Kerrison to debris the  attachment as a curet. Foraminotomy was then performed over the left L3 nerve root. The medial 10% superior articular process of L3 and then resected using 2 mm Kerrison. This allowed for identification of the thecal sac. Penfield 4 was then used to carefully mobilize the thecal sac medially and the L3 nerve root identified within the lateral recess flattened over the posterior aspect of the protruded disc. Carefully the lateral aspect of the L3 nerve root was identified and a Penfield 4 was used to mobilize the nerve medially such that the protruded disc was visible with microscope. Using a Penfield 4 for retraction and a 15 blade scalpel was used to incise the posterior longitudinal ligament within the lateral recess on the left side longitudinally. Disc material was removed using micropituitary rongeurs and nerve hook nerve root and then more easily able to be mobilized medially and retracted using a Derricho retractor. Further foraminotomies was performed over the L3 nerve root the nerve root was noted to be well decompressed without further compression. The nerve root able to be retracted along the medial aspect of the L3 pedicle and no disc material found to be subligamentous at this level.  Ligamentum flavum was further debrided superiorly above the level L2-3 disc. Had a moderate amount of further resection of the L2 lamina inferiorly and medially was performed. With this then the disc space at L2-3 was easily visualized and entry into the disc at the sided disc herniation was possible using a Penfield 4 intraoperative Lateral radiograph was used to identify the L2-3 disc with the Penfield 4 In place within the disc space at the end of the procedure at the L5-S1 level. Micropituitary was used to further debride this material superficially from the posterior aspect of the intervertebral disc is posterior lateral aspect of the disc. Small amount of further disc material was found subligamentous extending  laterally from the disc this was removed using micropituitary rongeurs into the left L2 neuroforamen additionally epstein  currettes were used to decompress the left L2 neuroforamen. Upbiting currettes were used to further remove reflected portions of the reflected portions of the medial L2-3 facet and portions of the superior portion of the L3 superior articular  Facet. Ligamentum flavum was debrided and lateral recess along the medial aspect L2-3 facet no further decompression was necessary. Ball tip nerve probe was then able to carefully palpate the neuroforamen for L2 and L3 finding these to be well decompressed.The retractors then removed from the left L2-3 laminectomy site. The depth at L5-S1 measured off of the Cobb elevators at about 80 mm and 80 mm retractors and placed on the scaffolding for the MIS equipment and guided down to and docking on the posterior aspect of the lamina at the expected bilateral L5-S1 level.  The operating room microscope then aligned for use at the L5-S1 left interspace carefully debrided the small amount of muscle attachment here and high-speed bur used to drill the medial aspect of the inferior articular process of L5 approximately 10%.  A localization lateral C-arm view was obtained with Penfield 4 in the L5-S1 facet. 2 mm Kerrison then used to enter the spinal canal over the superior  aspect of the S1 lamina carefully using the Kerrison to debris the attachment as a curet. Foraminotomy was then performed over the S1 nerve root. The medial 10% superior articular process of S1 and then resected using an osteotome and 2 mm Kerrison. This allowed for identification of the thecal sac. Penfield 4 was then used to carefully mobilize the thecal sac medially and the S1 nerve root identified within the lateral recess flattened over the posterior aspect of the bulging L5-S1 disc. Carefully the lateral aspect of the S1 nerve root was identified and a Penfield 4 was used to mobilize the  nerve medially such that no herniated disc was visible with microscope the disc prominence represented osteophyte. Ligamentum flavum was further debrided superiorly to the level L5-S1 disc. Had a moderate amount of further resection of the L5 lamina inferiorly was performed. With this then the disc space at L5-S1 was easily visualized. Lateral radiograph was used to identify the L5-S1 neuroforamen with the [facility] in place just within the L5 neuroforamen.  Ligamentum flavum was debrided and lateral recess along the medial aspect L5-S1 facet no further decompression was necessary. Ball tip nerve probe was then able to carefully palpate the neuroforamen for L5 and S1 finding these to be well decompressed. Attention then turned to the right L5-S1 level which was easily visualized with the microscope. Infiltration of the deeper fascia layers carried out with 15 cc.   The operating room microscope then aligned for use at the L5-S1 right interspace carefully debrided the small amount of muscle attachment here and high-speed bur used to drill the medial aspect of the inferior articular process of L5 approximately 10%.  A localization lateral C-arm view was obtained with Penfield 4 in the L5-S1 facet. 2 mm Kerrison then used to enter the spinal canal over the superior aspect of the S1 lamina carefully using the Kerrison to debris the attachment as a curet. Foraminotomy was then performed over the S1 nerve root. The medial 10% superior articular process of S1 and then resected using an osteotome and 2 mm Kerrison. This allowed for identification of the thecal sac. Penfield 4 was then used to carefully mobilize the thecal sac medially and the S1 nerve root identified within the lateral recess flattened over the posterior aspect of the bulging L5-S1 disc. Carefully the lateral aspect of the S1 nerve root was identified and a Penfield 4 was used to mobilize the nerve medially such that no herniated disc was visible with  microscope the disc prominence represented osteophyte. Ligamentum flavum was further debrided superiorly to the level L5-S1 disc. Had a moderate amount of further resection of the L5 lamina inferiorly was performed. With this then the disc space at L5-S1 was easily visualized. Lateral radiograph was used to identify the L5-S1 neuroforamen with the [facility] in place just within the L5 neuroforamen.  Ligamentum flavum was debrided and lateral recess along the medial aspect L5-S1 facet no further decompression was necessary. Ball tip nerve probe was then able to carefully palpate the neuroforamen for L5 and S1 finding these to be well decompressed.  No significant active bleeding present at the time of removal.Intra operative radiograph obtained showing the Penfield 4 at L2-3 and a Woodson at the L5-S1 neuroforamen. Small amount of bleeding within the soft tissue mass the laminotomy area was controlled using bipolar electrocautery. Irrigation was carried out using copious amounts of irrigant solution. All Gelfoam were then removed. No significant active bleeding present at the time of removal of retractors.  All instruments sponge counts were correct. Lumbodorsal fascia was then carefully approximated with interrupted 0 Vicryl sutures, deep subcutaneous layers were approximated with interrupted 0 Vicryl sutures on UR 6 the appear subcutaneous layers approximated with interrupted 2-0 Vicryl sutures and the skin closed with a running subcutaneous stitch of 4-0 Vicryl. Bleeding was then controlled using thrombin-soaked Gelfoam small cottonoids.All instruments sponge counts were correct traction system was then carefully removed carefully rotating retractors with this withdrawal and only bipolar electrocautery of any small bleeders. Lumbodorsal fascia was then carefully approximated with interrupted 0 Vicryl sutures, UR 6 needle deep subcutaneous layers were approximated with interrupted 0 Vicryl sutures on UR 6 the appear  subcutaneous layers approximated with interrupted 2-0 Vicryl sutures and the skin closed with stainless steel staples and then Mepilex bandage applied. Patient was then carefully returned to supine position on a stretcher, reactivated and extubated. He was then returned to recovery room in satisfactory condition. Benjiman Core PA-C perform the duties of assistant surgeon during this case. he was present from the beginning of the case to the end of the case assisting in transfer the patient from his stretcher to the OR table and back to the stretcher at the end of the case. Assisted in careful retraction and suction of the laminectomy site delicate neural structures operating under the operating room microscope. he performed closure of the incision from the fascia to the skin applying the dressing.     Basil Dess  04/25/2019, 5:27 PM

## 2019-04-25 NOTE — Transfer of Care (Signed)
Immediate Anesthesia Transfer of Care Note  [name]  Procedure(s) Performed: LEFT L2-3 MICRODISCECTOMY, BILATERAL L5-S1 PARTIAL HEMILAMINECTOMY (N/A )  Patient Location: PACU  Anesthesia Type:General  Level of Consciousness: sedated  Airway & Oxygen Therapy: Patient Spontanous Breathing and Patient connected to nasal cannula oxygen  Post-op Assessment: Report given to RN and Post -op Vital signs reviewed and stable  Post vital signs: Reviewed and stable  Last Vitals:  Vitals Value Taken Time  BP 128/89 04/25/19 1751  Temp    Pulse    Resp 11 04/25/19 1752  SpO2    Vitals shown include unvalidated device data.  Last Pain:  Vitals:   04/25/19 1110  TempSrc:   PainSc: 0-No pain         Complications: No apparent anesthesia complications

## 2019-04-25 NOTE — Brief Op Note (Signed)
PATIENT ID:      Lisa Briggs  MRN:     [id number] DOB/AGE:    Mar 26, 1961 / 59 y.o.       OPERATIVE REPORT   DATE OF PROCEDURE:  04/25/2019      PREOPERATIVE DIAGNOSIS:   L2-3 left herniated disc, bilateral L5-S1 spondylosis with lateral recess stenosis                                                       Body mass index is 33.97 kg/m.    POSTOPERATIVE DIAGNOSIS:   L2-3 left herniated disc, bilateral L5-S1 spondylosis with lateral recess stenosis                                                                     Body mass index is 33.97 kg/m.    PROCEDURE:  Procedure(s): LEFT L2-3 MICRODISCECTOMY, BILATERAL L5-S1 PARTIAL HEMILAMINECTOMY    SURGEON: Basil Dess   ASSISTANT: Esaw Grandchild          ANESTHESIA:  General and local marcaine 0.5% 1:1 exparel 1.3% total 40CC Dr. Tobias Alexander.  EBL:80CC  DRAINS:Foley removed at the end of the case.   COMPLICATIONS:  None   CONDITION:  stable    Basil Dess 04/25/2019, 5:23 PM

## 2019-04-25 NOTE — Discharge Instructions (Addendum)
    No lifting greater than 10 lbs. Avoid bending, stooping and twisting. Walk in house for first week them may start to get out slowly increasing distance up to one mile by 3 weeks post op. Keep incision dry for 3 days, may use tegaderm or similar water impervious dressing.  

## 2019-04-25 NOTE — Interval H&P Note (Signed)
History and Physical Interval Note:  04/25/2019 1:33 PM  Lisa Briggs  has presented today for surgery, with the diagnosis of L2-3 left herniated disc, bilateral L5-S1 spondylosis with lateral recess stenosis.  The various methods of treatment have been discussed with the patient and family. After consideration of risks, benefits and other options for treatment, the patient has consented to  Procedure(s): LEFT L2-3 MICRODISCECTOMY, BILATERAL L5-S1 PARTIAL HEMILAMINECTOMY (N/A) as a surgical intervention.  The patient's history has been reviewed, patient examined, no change in status, stable for surgery.  I have reviewed the patient's chart and labs.  Questions were answered to the patient's satisfaction.     Basil Dess

## 2019-04-25 NOTE — Anesthesia Preprocedure Evaluation (Addendum)
Anesthesia Evaluation  Patient identified by MRN, date of birth, ID band Patient awake    Reviewed: Allergy & Precautions, NPO status , Patient's Chart, lab work & pertinent test results  History of Anesthesia Complications (+) DIFFICULT AIRWAY  Airway Mallampati: II  TM Distance: >3 FB     Dental   Pulmonary    breath sounds clear to auscultation       Cardiovascular hypertension,  Rhythm:Regular Rate:Normal     Neuro/Psych    GI/Hepatic Neg liver ROS, GERD  ,  Endo/Other    Renal/GU negative Renal ROS     Musculoskeletal   Abdominal   Peds  Hematology  (+) anemia ,   Anesthesia Other Findings   Reproductive/Obstetrics                            Anesthesia Physical Anesthesia Plan  ASA: III  Anesthesia Plan: General   Post-op Pain Management:    Induction: Intravenous  PONV Risk Score and Plan: 3 and Ondansetron, Dexamethasone and Midazolam  Airway Management Planned: Oral ETT  Additional Equipment:   Intra-op Plan:   Post-operative Plan: Extubation in OR  Informed Consent: I have reviewed the patients History and Physical, chart, labs and discussed the procedure including the risks, benefits and alternatives for the proposed anesthesia with the patient or authorized representative who has indicated his/her understanding and acceptance.     Dental advisory given  Plan Discussed with: CRNA and Anesthesiologist  Anesthesia Plan Comments:         Anesthesia Quick Evaluation

## 2019-04-25 NOTE — Anesthesia Procedure Notes (Signed)
Procedure Name: Intubation Date/Time: 04/25/2019 2:08 PM Performed by: Imagene Riches, CRNA Pre-anesthesia Checklist: Patient identified, Emergency Drugs available, Suction available and Patient being monitored Patient Re-evaluated:Patient Re-evaluated prior to induction Oxygen Delivery Method: Circle System Utilized Preoxygenation: Pre-oxygenation with 100% oxygen Induction Type: IV induction Laryngoscope Size: Miller and 2 Grade View: Grade I Tube type: Oral Tube size: 7.0 mm Number of attempts: 1 Airway Equipment and Method: Stylet and Oral airway Placement Confirmation: ETT inserted through vocal cords under direct vision,  positive ETCO2 and breath sounds checked- equal and bilateral Secured at: 22 cm Tube secured with: Tape Dental Injury: Teeth and Oropharynx as per pre-operative assessment

## 2019-04-26 DIAGNOSIS — M5117 Intervertebral disc disorders with radiculopathy, lumbosacral region: Secondary | ICD-10-CM | POA: Diagnosis present

## 2019-04-26 DIAGNOSIS — M4727 Other spondylosis with radiculopathy, lumbosacral region: Secondary | ICD-10-CM | POA: Diagnosis present

## 2019-04-26 DIAGNOSIS — Z83511 Family history of glaucoma: Secondary | ICD-10-CM | POA: Diagnosis not present

## 2019-04-26 DIAGNOSIS — M2578 Osteophyte, vertebrae: Secondary | ICD-10-CM | POA: Diagnosis present

## 2019-04-26 DIAGNOSIS — K219 Gastro-esophageal reflux disease without esophagitis: Secondary | ICD-10-CM | POA: Diagnosis present

## 2019-04-26 DIAGNOSIS — D649 Anemia, unspecified: Secondary | ICD-10-CM | POA: Diagnosis present

## 2019-04-26 DIAGNOSIS — Z888 Allergy status to other drugs, medicaments and biological substances status: Secondary | ICD-10-CM | POA: Diagnosis not present

## 2019-04-26 DIAGNOSIS — Z20822 Contact with and (suspected) exposure to covid-19: Secondary | ICD-10-CM | POA: Diagnosis present

## 2019-04-26 DIAGNOSIS — Z8249 Family history of ischemic heart disease and other diseases of the circulatory system: Secondary | ICD-10-CM | POA: Diagnosis not present

## 2019-04-26 DIAGNOSIS — M4807 Spinal stenosis, lumbosacral region: Secondary | ICD-10-CM | POA: Diagnosis present

## 2019-04-26 DIAGNOSIS — Z833 Family history of diabetes mellitus: Secondary | ICD-10-CM | POA: Diagnosis not present

## 2019-04-26 DIAGNOSIS — M5116 Intervertebral disc disorders with radiculopathy, lumbar region: Secondary | ICD-10-CM | POA: Diagnosis present

## 2019-04-26 DIAGNOSIS — Z91018 Allergy to other foods: Secondary | ICD-10-CM | POA: Diagnosis not present

## 2019-04-26 DIAGNOSIS — I10 Essential (primary) hypertension: Secondary | ICD-10-CM | POA: Diagnosis present

## 2019-04-26 DIAGNOSIS — Z806 Family history of leukemia: Secondary | ICD-10-CM | POA: Diagnosis not present

## 2019-04-26 DIAGNOSIS — Z803 Family history of malignant neoplasm of breast: Secondary | ICD-10-CM | POA: Diagnosis not present

## 2019-04-26 DIAGNOSIS — Z8261 Family history of arthritis: Secondary | ICD-10-CM | POA: Diagnosis not present

## 2019-04-26 DIAGNOSIS — Z800 Family history of malignant neoplasm of digestive organs: Secondary | ICD-10-CM | POA: Diagnosis not present

## 2019-04-26 DIAGNOSIS — M48061 Spinal stenosis, lumbar region without neurogenic claudication: Secondary | ICD-10-CM | POA: Diagnosis present

## 2019-04-26 DIAGNOSIS — Z823 Family history of stroke: Secondary | ICD-10-CM | POA: Diagnosis not present

## 2019-04-26 LAB — CBC
HCT: 36.4 % (ref 36.0–46.0)
Hemoglobin: 11.3 g/dL — ABNORMAL LOW (ref 12.0–15.0)
MCH: 27.6 pg (ref 26.0–34.0)
MCHC: 31 g/dL (ref 30.0–36.0)
MCV: 88.8 fL (ref 80.0–100.0)
Platelets: 304 10*3/uL (ref 150–400)
RBC: 4.1 MIL/uL (ref 3.87–5.11)
RDW: 13.2 % (ref 11.5–15.5)
WBC: 11.7 10*3/uL — ABNORMAL HIGH (ref 4.0–10.5)
nRBC: 0 % (ref 0.0–0.2)

## 2019-04-26 LAB — BASIC METABOLIC PANEL
Anion gap: 11 (ref 5–15)
BUN: 11 mg/dL (ref 6–20)
CO2: 27 mmol/L (ref 22–32)
Calcium: 8.9 mg/dL (ref 8.9–10.3)
Chloride: 100 mmol/L (ref 98–111)
Creatinine, Ser: 0.73 mg/dL (ref 0.44–1.00)
GFR calc Af Amer: >60 mL/min (ref >60)
GFR calc non Af Amer: >60 mL/min (ref >60)
Glucose, Bld: 100 mg/dL — ABNORMAL HIGH (ref 70–99)
Potassium: 3.7 mmol/L (ref 3.5–5.1)
Sodium: 138 mmol/L (ref 135–145)

## 2019-04-26 MED ORDER — DOCUSATE SODIUM 100 MG PO CAPS: 100.0000 mg | ORAL_CAPSULE | Freq: Two times a day (BID) | ORAL | 0 refills | Status: DC

## 2019-04-26 MED ORDER — HYDROCODONE-ACETAMINOPHEN 10-325 MG PO TABS: 1.0000 | ORAL_TABLET | ORAL | 0 refills | Status: DC | PRN

## 2019-04-26 NOTE — Evaluation (Signed)
Occupational Therapy Evaluation Patient Details Name: Lisa Briggs MRN: [id number] DOB: 02-25-1961 Today's Date: 04/26/2019    History of Present Illness Pt is a 59 yr old female who had LEFT L2-3 MICRODISCECTOMY, BILATERAL L5-S1 PARTIAL HEMILAMINECTOMY. PMH but not limited to: anemia, anxiety, chronic back pain, GERD, HTN, paresthesia, TIA   Clinical Impression   Patient is s/p see above surgery resulting in the deficits listed below (see OT Problem List). Patient lives in a two story home and planning to remain on first level with bathroom and getting a bed to remain on main level. Patient planning to only complete sponge bath when returning home.  Patient at this time take increase time to complete tasks due to pain and weakness. Patient requires min assist for LE dressing/showering.   Patient will benefit from skilled OT to increase their safety and independence with ADL and functional mobility for ADL (while adhering to their precautions) to facilitate discharge to venue listed below.       Follow Up Recommendations  Home health OT;Supervision/Assistance - 24 hour    Equipment Recommendations  3 in 1 bedside commode;None recommended by OT;Tub/shower bench(walker )    Recommendations for Other Services       Precautions / Restrictions Precautions Precautions: Back Precaution Comments: able to report 2/3 Restrictions Weight Bearing Restrictions: No      Mobility Bed Mobility Overal bed mobility: (presented EOB)                Transfers Overall transfer level: Needs assistance Equipment used: Rolling walker (2 wheeled) Transfers: Sit to/from Stand Sit to Stand: Min guard              Balance Overall balance assessment: Mild deficits observed, not formally tested                                         ADL either performed or assessed with clinical judgement   ADL Overall ADL's : Needs assistance/impaired Eating/Feeding:  Independent;Sitting   Grooming: Wash/dry hands;Wash/dry face;Min guard;Standing   Upper Body Bathing: Modified independent;Sitting   Lower Body Bathing: Minimal assistance;Sit to/from stand   Upper Body Dressing : Set up;Sitting   Lower Body Dressing: Minimal assistance;Sit to/from stand   Toilet Transfer: Min guard   [place] and Hygiene: Min guard;Sit to/from stand   Tub/ Banker: Minimal assistance   Functional mobility during ADLs: Min guard;Cueing for safety;Cueing for sequencing;Rolling walker       Vision Baseline Vision/History: No visual deficits Patient Visual Report: No change from baseline Vision Assessment?: No apparent visual deficits     Perception Perception Perception Tested?: No   Praxis Praxis Praxis tested?: Within functional limits    Pertinent Vitals/Pain Pain Assessment: 0-10 Pain Score: 4  Pain Descriptors / Indicators: Aching Pain Intervention(s): Limited activity within patient's tolerance;Monitored during session;Repositioned     Hand Dominance     Extremity/Trunk Assessment Upper Extremity Assessment Upper Extremity Assessment: Overall WFL for tasks assessed   Lower Extremity Assessment Lower Extremity Assessment: Defer to PT evaluation   Cervical / Trunk Assessment Cervical / Trunk Assessment: Other exceptions Cervical / Trunk Exceptions: s/p sx   Communication Communication Communication: No difficulties   Cognition Arousal/Alertness: Awake/alert Behavior During Therapy: WFL for tasks assessed/performed Overall Cognitive Status: Within Functional Limits for tasks assessed  General Comments       Exercises     Shoulder Instructions      Home Living Family/patient expects to be discharged to:: Private residence Living Arrangements: Spouse/significant other Available Help at Discharge: Family Type of Home: House       Home Layout:  Two level     Bathroom Shower/Tub: Teacher, early years/pre: Standard Bathroom Accessibility: Yes How Accessible: Accessible via walker Home Equipment: [place] - single point   Additional Comments: pt plan to remain on main level and do sponge bath       Prior Functioning/Environment Level of Independence: Needs assistance    ADL's / Homemaking Assistance Needed: increase time depending on pani            OT Problem List: Decreased strength;Decreased activity tolerance;Impaired balance (sitting and/or standing);Decreased safety awareness;Decreased knowledge of use of DME or AE;Pain      OT Treatment/Interventions:      OT Goals(Current goals can be found in the care plan section) Acute Rehab OT Goals Patient Stated Goal: to decrease pain OT Goal Formulation: With patient Time For Goal Achievement: 05/03/19 Potential to Achieve Goals: Good  OT Frequency:     Barriers to D/C:            Co-evaluation              AM-PAC OT "6 Clicks" Daily Activity     Outcome Measure Help from another person eating meals?: None Help from another person taking care of personal grooming?: None Help from another person toileting, which includes using toliet, bedpan, or urinal?: A Little Help from another person bathing (including washing, rinsing, drying)?: A Little Help from another person to put on and taking off regular upper body clothing?: A Little Help from another person to put on and taking off regular lower body clothing?: A Little 6 Click Score: 20   End of Session Equipment Utilized During Treatment: Gait belt;Rolling walker  Activity Tolerance: Patient tolerated treatment well Patient left: in bed;with call bell/phone within reach                   Time: 0731-0815 OT Time Calculation (min): 44 min Charges:  OT General Charges $OT Visit: 1 Visit OT Evaluation $OT Eval Low Complexity: 1 Low OT Treatments $Self Care/Home Management : 23-37 mins  Joeseph Amor OTR/L  Acute Rehab Services  [phone] office number [phone] pager number   Joeseph Amor 04/26/2019, 8:58 AM

## 2019-04-26 NOTE — Progress Notes (Signed)
     Subjective: 1 Day Post-Op Procedure(s) (LRB): LEFT L2-3 MICRODISCECTOMY, BILATERAL L5-S1 PARTIAL HEMILAMINECTOMY (N/A) Awake, alert and oriented x 4. Dressing with minimal drainage. NV intact, walking with walker and assistance. HHN for PT at home. SS requests change to inpatient as she was approved for this by her insurance.   Patient reports pain as moderate.    Objective:   VITALS:  Temp:  [97.7 F (36.5 C)-98.3 F (36.8 C)] 98.3 F (36.8 C) (01/23 0738) Pulse Rate:  [76-98] 87 (01/23 0738) Resp:  [11-20] 18 (01/23 0738) BP: (104-143)/(58-89) 122/68 (01/23 0738) SpO2:  [93 %-100 %] 98 % (01/23 0738) Weight:  [104.3 kg] 104.3 kg (01/22 1029)  Neurologically intact ABD soft Neurovascular intact Sensation intact distally Intact pulses distally Dorsiflexion/Plantar flexion intact Incision: scant drainage No cellulitis present   LABS Recent Labs    04/26/19 0526  HGB 11.3*  WBC 11.7*  PLT 304   Recent Labs    04/26/19 0526  NA 138  K 3.7  CL 100  CO2 27  BUN 11  CREATININE 0.73  GLUCOSE 100*   No results for input(s): LABPT, INR in the last 72 hours.   Assessment/Plan: 1 Day Post-Op Procedure(s) (LRB): LEFT L2-3 MICRODISCECTOMY, BILATERAL L5-S1 PARTIAL HEMILAMINECTOMY (N/A)  Advance diet Up with therapy D/C IV fluids Discharge home with home health  Basil Dess 04/26/2019, 9:44 AM

## 2019-04-26 NOTE — Evaluation (Signed)
Physical Therapy Evaluation Patient Details Name: Lisa Briggs MRN: [id number] DOB: May 14, 1960 Today's Date: 04/26/2019   History of Present Illness  Pt is a 59 y/o female s/p L L2-L3 microdiscectomy and Bilateral L5-S1 partial hemilaminectomy. PMH including but not limited to HTN and TIA.    Clinical Impression  Pt presented seated EOB upon arrival, awake and willing to participate in therapy session. Prior to admission, pt reported that she ambulated with use of a cane. She lives with her husband and will have adequate assistance upon d/c home. At the time of evaluation, pt limited secondary to pain and fatigue. She was able to ambulate in hallway with RW and participated in stair training with min guard for safety. PT provided pt education re: back precautions, car transfers and a generalized walking program for pt to initiate upon d/c home. Plan is to d/c home today with family support. No further acute PT needs identified at this time. PT signing off.     Follow Up Recommendations Outpatient PT;Other (comment)(continue OP PT once cleared by MD)    Equipment Recommendations  Rolling walker with 5" wheels;3in1 (PT)    Recommendations for Other Services       Precautions / Restrictions Precautions Precautions: Back Precaution Comments: reviewed 3/3 back precautions with pt throughout Restrictions Weight Bearing Restrictions: No      Mobility  Bed Mobility Overal bed mobility: (presented EOB)             General bed mobility comments: pt seated EOB upon arrival  Transfers Overall transfer level: Needs assistance Equipment used: Rolling walker (2 wheeled) Transfers: Sit to/from Stand Sit to Stand: Min guard         General transfer comment: good technique, min guard for safety  Ambulation/Gait Ambulation/Gait assistance: Min guard Gait Distance (Feet): 75 Feet Assistive device: Rolling walker (2 wheeled) Gait Pattern/deviations: Step-through pattern;Decreased  stride length Gait velocity: decreased   General Gait Details: pt with slow, cautious and guarded gait with mild instability but no overt LOB or need for physical assistance, min guard for safety  Stairs Stairs: Yes Stairs assistance: Min guard Stair Management: One rail Right;Step to pattern;Forwards Number of Stairs: 2    Wheelchair Mobility    Modified Rankin (Stroke Patients Only)       Balance Overall balance assessment: Mild deficits observed, not formally tested                                           Pertinent Vitals/Pain Pain Assessment: Faces Pain Score: 4  Faces Pain Scale: Hurts little more Pain Location: back Pain Descriptors / Indicators: Aching Pain Intervention(s): Monitored during session;Repositioned    Home Living Family/patient expects to be discharged to:: Private residence Living Arrangements: Spouse/significant other Available Help at Discharge: Family Type of Home: House       Home Layout: Two level Home Equipment: Cane - single point Additional Comments: pt plan to remain on main level and do sponge bath     Prior Function Level of Independence: Independent with assistive device(s)      ADL's / Homemaking Assistance Needed: increase time depending on pani  Comments: ambulates with use of a cane     Hand Dominance        Extremity/Trunk Assessment   Upper Extremity Assessment Upper Extremity Assessment: Overall WFL for tasks assessed    Lower Extremity Assessment Lower Extremity  Assessment: Generalized weakness    Cervical / Trunk Assessment Cervical / Trunk Assessment: Other exceptions Cervical / Trunk Exceptions: s/p lumbar sx  Communication   Communication: No difficulties  Cognition Arousal/Alertness: Awake/alert Behavior During Therapy: WFL for tasks assessed/performed Overall Cognitive Status: Within Functional Limits for tasks assessed                                         General Comments      Exercises     Assessment/Plan    PT Assessment All further PT needs can be met in the next venue of care;Patent does not need any further PT services  PT Problem List Decreased strength;Decreased activity tolerance;Decreased mobility;Decreased coordination;Decreased balance;Decreased knowledge of use of DME;Decreased safety awareness;Decreased knowledge of precautions;Pain       PT Treatment Interventions      PT Goals (Current goals can be found in the Care Plan section)  Acute Rehab PT Goals Patient Stated Goal: "home today" PT Goal Formulation: All assessment and education complete, DC therapy    Frequency     Barriers to discharge        Co-evaluation               AM-PAC PT "6 Clicks" Mobility  Outcome Measure Help needed turning from your back to your side while in a flat bed without using bedrails?: None Help needed moving from lying on your back to sitting on the side of a flat bed without using bedrails?: None Help needed moving to and from a bed to a chair (including a wheelchair)?: None Help needed standing up from a chair using your arms (e.g., wheelchair or bedside chair)?: None Help needed to walk in hospital room?: None Help needed climbing 3-5 steps with a railing? : None 6 Click Score: 24    End of Session   Activity Tolerance: Patient tolerated treatment well Patient left: with call bell/phone within reach;Other (comment)(seated EOB) Nurse Communication: Mobility status PT Visit Diagnosis: Other abnormalities of gait and mobility (R26.89)    Time: 0350-0938 PT Time Calculation (min) (ACUTE ONLY): 13 min   Charges:   PT Evaluation $PT Eval Low Complexity: 1 Low          Eduard Clos, PT, DPT  Acute Rehabilitation Services Pager [phone] Office [place] 04/26/2019, 9:41 AM

## 2019-04-26 NOTE — Anesthesia Postprocedure Evaluation (Signed)
Anesthesia Post Note  [name]  Procedure(s) Performed: LEFT L2-3 MICRODISCECTOMY, BILATERAL L5-S1 PARTIAL HEMILAMINECTOMY (N/A )     Patient location during evaluation: PACU Anesthesia Type: General Level of consciousness: sedated Pain management: pain level controlled Vital Signs Assessment: post-procedure vital signs reviewed and stable Respiratory status: spontaneous breathing and respiratory function stable Cardiovascular status: stable Postop Assessment: no apparent nausea or vomiting Anesthetic complications: no                 Jeani Fassnacht DANIEL

## 2019-04-26 NOTE — Care Management (Signed)
Patient prefers to carry on with OPPT/OT as prior to arrival.  Referral placed for OPPT/OT when cleared by MD.

## 2019-04-26 NOTE — Progress Notes (Signed)
Patient alert and oriented, mae's well, voiding adequate amount of urine, swallowing without difficulty, no c/o pain at time of discharge. Patient discharged home with family. Script and discharged instructions given to patient. Patient and family stated understanding of instructions given. Patient has an appointment with Dr. Nitka 

## 2019-04-27 ENCOUNTER — Other Ambulatory Visit: Payer: Self-pay | Admitting: Physician Assistant

## 2019-04-27 DIAGNOSIS — M0579 Rheumatoid arthritis with rheumatoid factor of multiple sites without organ or systems involvement: Secondary | ICD-10-CM

## 2019-04-28 MED FILL — Thrombin (Recombinant) For Soln 20000 Unit: CUTANEOUS | Qty: 1 | Status: AC

## 2019-04-28 NOTE — Telephone Encounter (Signed)
Last Visit: 02/04/19 Next Visit: 07/07/19 Labs: 04/15/19 WNL PLQ Eye Exam: 05/29/18   Okay to refill per Dr. Estanislado Pandy

## 2019-05-01 ENCOUNTER — Telehealth: Payer: Self-pay | Admitting: Specialist

## 2019-05-01 ENCOUNTER — Other Ambulatory Visit: Payer: Self-pay | Admitting: Medical

## 2019-05-01 ENCOUNTER — Other Ambulatory Visit: Payer: Self-pay | Admitting: Specialist

## 2019-05-01 DIAGNOSIS — M5126 Other intervertebral disc displacement, lumbar region: Secondary | ICD-10-CM

## 2019-05-01 DIAGNOSIS — M48062 Spinal stenosis, lumbar region with neurogenic claudication: Secondary | ICD-10-CM

## 2019-05-01 DIAGNOSIS — R110 Nausea: Secondary | ICD-10-CM

## 2019-05-01 DIAGNOSIS — M5136 Other intervertebral disc degeneration, lumbar region: Secondary | ICD-10-CM

## 2019-05-01 NOTE — Telephone Encounter (Signed)
Per pt she was to get HHPT, but according to message she wanted to do outpatient PT, so orders where placed when patient was in the hospital for outpatient PT and OT for once she was cleared by MD to start.  Did you want her to have PT/OT for microdiscectomy/Hemilaminectomy?  If so [facility] or outpatient?

## 2019-05-01 NOTE — Telephone Encounter (Signed)
Patient called. She would like in home PT. She says she is unable to go anywhere. Her call back number is [phone]

## 2019-05-01 NOTE — Telephone Encounter (Signed)
Patient called and stated that she was supposed to get [facility] PT and have not heard from anyone.  Please call patient to advise.  [phone]

## 2019-05-01 NOTE — Telephone Encounter (Signed)
Order placed-patient is aware

## 2019-05-01 NOTE — Telephone Encounter (Signed)
HHN for PT is appropriate, she has had bilateral hemilaminectomies at L5-S1 and left sided discectomy at L1-2

## 2019-05-07 ENCOUNTER — Ambulatory Visit (INDEPENDENT_AMBULATORY_CARE_PROVIDER_SITE_OTHER): Payer: BC Managed Care – PPO | Admitting: Specialist

## 2019-05-07 ENCOUNTER — Telehealth: Payer: Self-pay | Admitting: Specialist

## 2019-05-07 ENCOUNTER — Other Ambulatory Visit: Payer: Self-pay

## 2019-05-07 ENCOUNTER — Encounter: Payer: Self-pay | Admitting: Specialist

## 2019-05-07 VITALS — Ht 69.0 in | Wt 230.0 lb

## 2019-05-07 DIAGNOSIS — Z9889 Other specified postprocedural states: Secondary | ICD-10-CM

## 2019-05-07 MED ORDER — METHOCARBAMOL 500 MG PO TABS: 500.0000 mg | ORAL_TABLET | Freq: Four times a day (QID) | ORAL | 0 refills | Status: DC | PRN

## 2019-05-07 MED ORDER — HYDROCODONE-ACETAMINOPHEN 10-325 MG PO TABS: 1.0000 | ORAL_TABLET | Freq: Four times a day (QID) | ORAL | 0 refills | Status: DC | PRN

## 2019-05-07 NOTE — Progress Notes (Signed)
Post-Op Visit Note   [name]           Date of Birth: 1960-11-30           MRN: [id number] Visit Date: 05/07/2019 PCP: Carlena Hurl, PA-C   Assessment & Plan:  Chief Complaint:  Chief Complaint  Patient presents with  . Lower Back - Routine Post Op  Patient doing well.  Preop leg pain greatly improved.   Visit Diagnoses:  1. S/P lumbar microdiscectomy   2. Status post lumbar laminectomy     Plan: I will have patient follow-up with me Monday afternoon for wound check and possible staple removal.  She requested an elevated toilet seat and shower seat and this was given.  Refilled prescription for Norco and also sent in Robaxin.  Follow-Up Instructions: Return in about 5 days (around 05/12/2019) for Add patient onto Dr. Otho Ket p.m. scheduled.  Jeneen Rinks will see patient for postop wound check.   Orders:  No orders of the defined types were placed in this encounter.  Meds ordered this encounter  Medications  . HYDROcodone-acetaminophen (NORCO) 10-325 MG tablet    Sig: Take 1 tablet by mouth every 6 (six) hours as needed for moderate pain ((score 4 to 6)).    Dispense:  40 tablet    Refill:  0  . methocarbamol (ROBAXIN) 500 MG tablet    Sig: Take 1 tablet (500 mg total) by mouth every 6 (six) hours as needed for muscle spasms.    Dispense:  50 tablet    Refill:  0    Imaging: No results found.  PMFS History: Patient Active Problem List   Diagnosis Date Noted  . 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 Spinal stenosis of lumbar region 04/25/2019    Class: Chronic  . Status post lumbar laminectomy 04/25/2019  . Neck pain 11/12/2018  . Chronic pain of right knee 09/13/2018  . Bruising 09/13/2018  . SOB (shortness of breath) 08/30/2018  . Chronic pain of left knee 07/08/2018  . Chronic hip pain 07/08/2018  . Edema 07/08/2018  . Left foot pain 04/08/2018  . Olecranon bursitis of left elbow 04/05/2018  . Leg swelling  12/05/2017  . Need for shingles vaccine 12/05/2017  . Primary osteoarthritis of both hands 11/23/2017  . Rheumatoid factor positive 11/23/2017  . Primary osteoarthritis of both knees 11/23/2017  . Primary osteoarthritis of both feet 11/23/2017  . DDD (degenerative disc disease), lumbar 11/23/2017  . Vaccine counseling 08/13/2017  . Polyarthralgia 08/13/2017  . Joint stiffness 08/13/2017  . Sensitive skin 01/23/2017  . Need for influenza vaccination 01/23/2017  . Proteinuria 01/23/2017  . History of gastrointestinal stromal tumor (GIST) 04/20/2016  . History of TIA (transient ischemic attack) 04/20/2016  . Screening for breast cancer 04/20/2016  . Estrogen deficiency 04/20/2016  . Chronic maxillary sinusitis 04/20/2016  . Impaired fasting blood sugar 04/20/2016  . Constipation 04/20/2016  . History of fall 04/20/2016  . Chronic radicular lumbar pain 01/27/2016  . Encounter for health maintenance examination in adult 03/22/2015  . Paresthesia 03/22/2015  . Cognitive decline 03/22/2015  . Screening for cervical cancer 03/22/2015  . Vitamin D deficiency 03/22/2015  . History of uterine leiomyoma 03/22/2015  . Gastroesophageal reflux disease without esophagitis 03/02/2014  . Chronic nausea 03/02/2014  . Chronic abdominal pain 03/02/2014  . Rhinitis, allergic 03/02/2014  . Essential hypertension 03/02/2014  . Hyperlipidemia 03/02/2014  . Obesity with serious comorbidity 01/16/2012   Past  Medical History:  Diagnosis Date  . Allergy   . Anemia    iron therapy for years as of 10/12; normal Hgb 08/2013  . Anxiety   . Arthritis   . Chest pain 04/05/2011   cardiac eval, normal treadmill stress test, Dr. Tollie Eth  . Chronic back pain   . Constipation   . Farsightedness    wears glasses, [facility] care center  . Gastrointestinal stromal tumor (GIST) ([place]) 06/2014   Dr. [name], [facility]  . GERD (gastroesophageal reflux disease)   . History of uterine fibroid    . Hyperlipidemia   . Hypertension   . Paresthesia 09/2014   initially thought to be TIA, neurology consult in 12/2014 with other non TIA considerations.    . Polyarthralgia    normal rheumatoid screen 01/2012  . TIA (transient ischemic attack) 2015/16    Family History  Problem Relation Age of Onset  . Hypertension Mother   . Arthritis Mother   . GER disease Mother   . Glaucoma Mother   . Breast cancer Mother   . Stroke Father   . Breast cancer Sister        breast cancer dx late 58s  . Hypertension Sister   . Colon cancer Brother 16  . Diabetes Brother   . Diabetes Maternal Aunt   . Heart disease Maternal Aunt   . Heart disease Maternal Grandmother   . Heart disease Maternal Grandfather   . Heart disease Maternal Uncle   . Stroke Maternal Uncle   . Leukemia Sister   . Hypertension Sister   . Healthy Son   . Colon polyps Neg Hx   . Esophageal cancer Neg Hx   . Stomach cancer Neg Hx   . Rectal cancer Neg Hx     Past Surgical History:  Procedure Laterality Date  . COLONOSCOPY  01/2014   diverticulosis, othwerise normal - Dr. Eston Heslin Loffler  . ESOPHAGOGASTRODUODENOSCOPY  2013   Dr. Benson Norway, gastritis  . ESOPHAGOGASTRODUODENOSCOPY  [id number]  . EUS N/A 04/16/2014   Procedure: UPPER ENDOSCOPIC ULTRASOUND (EUS) LINEAR;  Surgeon: Milus Banister, MD;  Location: [facility];  Service: Endoscopy;  Laterality: N/A;  . gall stone surgery    . KNEE ARTHROSCOPY Left   . LAPAROSCOPIC GASTRIC RESECTION N/A 06/09/2014   Procedure: LAPAROSCOPIC GASTRIC MASS RESECTION;  Surgeon: [name], MD;  Location: [facility];  Service: General;  Laterality: N/A;  . LIPOMA EXCISION     forehead  . LUMBAR LAMINECTOMY N/A 04/25/2019   Procedure: LEFT L2-3 MICRODISCECTOMY, BILATERAL L5-S1 PARTIAL HEMILAMINECTOMY;  Surgeon: Jessy Oto, MD;  Location: [place];  Service: Orthopedics;  Laterality: N/A;  . UPPER GASTROINTESTINAL ENDOSCOPY    . UTERINE FIBROID SURGERY     Social History   Occupational  History  . Occupation: IT sales professional: [employer]  Tobacco Use  . Smoking status: Never Smoker  . Smokeless tobacco: Never Used  Substance and Sexual Activity  . Alcohol use: No    Alcohol/week: 0.0 standard drinks  . Drug use: No  . Sexual activity: Not on file   Exam Very pleasant female alert and oriented in no acute distress.  Neurologically intact.  Wounds look good.  Staples intact.  No drainage or signs of infection.

## 2019-05-07 NOTE — Telephone Encounter (Signed)
Lisa Briggs with [employer] called in stating the pt is needing some supplies such as, raised toilet seat, shower bench, and bed pads but is needing the codes for those supplies in order for it to be covered by insurance. She states info. can be given to pt.   [phone]

## 2019-05-08 NOTE — Telephone Encounter (Signed)
See message below °

## 2019-05-09 ENCOUNTER — Telehealth: Payer: Self-pay | Admitting: Specialist

## 2019-05-09 DIAGNOSIS — M069 Rheumatoid arthritis, unspecified: Secondary | ICD-10-CM | POA: Diagnosis not present

## 2019-05-09 DIAGNOSIS — I10 Essential (primary) hypertension: Secondary | ICD-10-CM | POA: Diagnosis not present

## 2019-05-09 DIAGNOSIS — M19071 Primary osteoarthritis, right ankle and foot: Secondary | ICD-10-CM | POA: Diagnosis not present

## 2019-05-09 DIAGNOSIS — K5900 Constipation, unspecified: Secondary | ICD-10-CM | POA: Diagnosis not present

## 2019-05-09 DIAGNOSIS — G5603 Carpal tunnel syndrome, bilateral upper limbs: Secondary | ICD-10-CM | POA: Diagnosis not present

## 2019-05-09 DIAGNOSIS — M48062 Spinal stenosis, lumbar region with neurogenic claudication: Secondary | ICD-10-CM | POA: Diagnosis not present

## 2019-05-09 DIAGNOSIS — M19072 Primary osteoarthritis, left ankle and foot: Secondary | ICD-10-CM | POA: Diagnosis not present

## 2019-05-09 DIAGNOSIS — Z4789 Encounter for other orthopedic aftercare: Secondary | ICD-10-CM | POA: Diagnosis not present

## 2019-05-09 DIAGNOSIS — M47817 Spondylosis without myelopathy or radiculopathy, lumbosacral region: Secondary | ICD-10-CM | POA: Diagnosis not present

## 2019-05-09 DIAGNOSIS — M19041 Primary osteoarthritis, right hand: Secondary | ICD-10-CM | POA: Diagnosis not present

## 2019-05-09 DIAGNOSIS — M5126 Other intervertebral disc displacement, lumbar region: Secondary | ICD-10-CM | POA: Diagnosis not present

## 2019-05-09 DIAGNOSIS — M19042 Primary osteoarthritis, left hand: Secondary | ICD-10-CM | POA: Diagnosis not present

## 2019-05-09 DIAGNOSIS — D649 Anemia, unspecified: Secondary | ICD-10-CM | POA: Diagnosis not present

## 2019-05-09 DIAGNOSIS — E668 Other obesity: Secondary | ICD-10-CM | POA: Diagnosis not present

## 2019-05-09 DIAGNOSIS — G8929 Other chronic pain: Secondary | ICD-10-CM | POA: Diagnosis not present

## 2019-05-09 DIAGNOSIS — M170 Bilateral primary osteoarthritis of knee: Secondary | ICD-10-CM | POA: Diagnosis not present

## 2019-05-09 NOTE — Telephone Encounter (Signed)
Holding for you.  

## 2019-05-09 NOTE — Telephone Encounter (Signed)
yes

## 2019-05-09 NOTE — Telephone Encounter (Signed)
Cindee Salt from [facility] called for verbal orders. He would like 1x 1wk, 2x 3wks. His call back number is [phone]

## 2019-05-09 NOTE — Telephone Encounter (Signed)
Ok for orders? 

## 2019-05-09 NOTE — Telephone Encounter (Signed)
I left voicemail advising. ?

## 2019-05-09 NOTE — Telephone Encounter (Signed)
Pt called stating a fax containing information on her recent procedure will be coming through and she would like for any additional information such as work restrictions be attached.

## 2019-05-12 ENCOUNTER — Other Ambulatory Visit: Payer: Self-pay

## 2019-05-12 ENCOUNTER — Ambulatory Visit (INDEPENDENT_AMBULATORY_CARE_PROVIDER_SITE_OTHER): Payer: BC Managed Care – PPO | Admitting: Specialist

## 2019-05-12 ENCOUNTER — Encounter: Payer: Self-pay | Admitting: Specialist

## 2019-05-12 VITALS — BP 97/66 | HR 85 | Ht 69.0 in | Wt 241.0 lb

## 2019-05-12 DIAGNOSIS — M4726 Other spondylosis with radiculopathy, lumbar region: Secondary | ICD-10-CM

## 2019-05-12 DIAGNOSIS — M48062 Spinal stenosis, lumbar region with neurogenic claudication: Secondary | ICD-10-CM

## 2019-05-12 DIAGNOSIS — Z9889 Other specified postprocedural states: Secondary | ICD-10-CM

## 2019-05-12 DIAGNOSIS — M5136 Other intervertebral disc degeneration, lumbar region: Secondary | ICD-10-CM

## 2019-05-12 DIAGNOSIS — M51369 Other intervertebral disc degeneration, lumbar region without mention of lumbar back pain or lower extremity pain: Secondary | ICD-10-CM

## 2019-05-12 NOTE — Telephone Encounter (Signed)
She brought papers in with her, I advised they go thru [employer], she will give to the front desk when she leaves

## 2019-05-12 NOTE — Progress Notes (Signed)
Post-Op Visit Note   [name]           Date of Birth: 1961-03-27           MRN: [id number] Visit Date: 05/12/2019 PCP: Carlena Hurl, PA-C   Assessment & Plan:2 1/2 weeks post lumbar laminectomies left L2-3 and bilateral L5-S1 Chief Complaint:  Chief Complaint  Patient presents with  . Lower Back - Routine Post Op, Wound Check  Left knee with pain posterior and anterior medial. I am on them too long and they hurt. I have swelling in both legs.  Incisions x2 upper lumbar and lower lumbar are healed and staples intact, staples removed today and steristips applied PT and OT at home with HHN.  Visit Diagnoses: No diagnosis found.  Plan: Avoid frequent bending and stooping  No lifting greater than 10 lbs. May use ice or moist heat for pain. Weight loss is of benefit. Best medication for lumbar disc disease is arthritis medications like motrin, celebrex and naprosyn. Exercise is important to improve your indurance and does allow people to function better inspite of back pain. Work with PT at home for now. Call when your prescription is running low and we with renew. At 6 weeks post op you will need to stop narcotic medications and stay with arthritis medications   Follow-Up Instructions: No follow-ups on file.   Orders:  No orders of the defined types were placed in this encounter.  No orders of the defined types were placed in this encounter.   Imaging: No results found.  PMFS History: Patient Active Problem List   Diagnosis Date Noted  . 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    Priority: High    Class: Chronic  . Spinal stenosis of lumbar region 04/25/2019    Priority: High    Class: Chronic  . Status post lumbar laminectomy 04/25/2019  . Neck pain 11/12/2018  . Chronic pain of right knee 09/13/2018  . Bruising 09/13/2018  . SOB (shortness of breath) 08/30/2018  . Chronic pain of left knee 07/08/2018  . Chronic hip pain  07/08/2018  . Edema 07/08/2018  . Left foot pain 04/08/2018  . Olecranon bursitis of left elbow 04/05/2018  . Leg swelling 12/05/2017  . Need for shingles vaccine 12/05/2017  . Primary osteoarthritis of both hands 11/23/2017  . Rheumatoid factor positive 11/23/2017  . Primary osteoarthritis of both knees 11/23/2017  . Primary osteoarthritis of both feet 11/23/2017  . DDD (degenerative disc disease), lumbar 11/23/2017  . Vaccine counseling 08/13/2017  . Polyarthralgia 08/13/2017  . Joint stiffness 08/13/2017  . Sensitive skin 01/23/2017  . Need for influenza vaccination 01/23/2017  . Proteinuria 01/23/2017  . History of gastrointestinal stromal tumor (GIST) 04/20/2016  . History of TIA (transient ischemic attack) 04/20/2016  . Screening for breast cancer 04/20/2016  . Estrogen deficiency 04/20/2016  . Chronic maxillary sinusitis 04/20/2016  . Impaired fasting blood sugar 04/20/2016  . Constipation 04/20/2016  . History of fall 04/20/2016  . Chronic radicular lumbar pain 01/27/2016  . Encounter for health maintenance examination in adult 03/22/2015  . Paresthesia 03/22/2015  . Cognitive decline 03/22/2015  . Screening for cervical cancer 03/22/2015  . Vitamin D deficiency 03/22/2015  . History of uterine leiomyoma 03/22/2015  . Gastroesophageal reflux disease without esophagitis 03/02/2014  . Chronic nausea 03/02/2014  . Chronic abdominal pain 03/02/2014  . Rhinitis, allergic 03/02/2014  . Essential hypertension 03/02/2014  . Hyperlipidemia 03/02/2014  . Obesity with serious comorbidity 01/16/2012  Past Medical History:  Diagnosis Date  . Allergy   . Anemia    iron therapy for years as of 10/12; normal Hgb 08/2013  . Anxiety   . Arthritis   . Chest pain 04/05/2011   cardiac eval, normal treadmill stress test, Dr. Tollie Eth  . Chronic back pain   . Constipation   . Farsightedness    wears glasses, [facility] care center  . Gastrointestinal stromal tumor (GIST) ([place])  06/2014   Dr. [name], [facility]  . GERD (gastroesophageal reflux disease)   . History of uterine fibroid   . Hyperlipidemia   . Hypertension   . Paresthesia 09/2014   initially thought to be TIA, neurology consult in 12/2014 with other non TIA considerations.    . Polyarthralgia    normal rheumatoid screen 01/2012  . TIA (transient ischemic attack) 2015/16    Family History  Problem Relation Age of Onset  . Hypertension Mother   . Arthritis Mother   . GER disease Mother   . Glaucoma Mother   . Breast cancer Mother   . Stroke Father   . Breast cancer Sister        breast cancer dx late 66s  . Hypertension Sister   . Colon cancer Brother 48  . Diabetes Brother   . Diabetes Maternal Aunt   . Heart disease Maternal Aunt   . Heart disease Maternal Grandmother   . Heart disease Maternal Grandfather   . Heart disease Maternal Uncle   . Stroke Maternal Uncle   . Leukemia Sister   . Hypertension Sister   . Healthy Son   . Colon polyps Neg Hx   . Esophageal cancer Neg Hx   . Stomach cancer Neg Hx   . Rectal cancer Neg Hx     Past Surgical History:  Procedure Laterality Date  . COLONOSCOPY  01/2014   diverticulosis, othwerise normal - Dr. Owens Loffler  . ESOPHAGOGASTRODUODENOSCOPY  2013   Dr. Benson Norway, gastritis  . ESOPHAGOGASTRODUODENOSCOPY  [id number]  . EUS N/A 04/16/2014   Procedure: UPPER ENDOSCOPIC ULTRASOUND (EUS) LINEAR;  Surgeon: Milus Banister, MD;  [facility];  Service: Endoscopy;  Laterality: N/A;  . gall stone surgery    . KNEE ARTHROSCOPY Left   . LAPAROSCOPIC GASTRIC RESECTION N/A 06/09/2014   Procedure: LAPAROSCOPIC GASTRIC MASS RESECTION;  Surgeon: [name], MD;  [facility]: [facility];  Service: General;  Laterality: N/A;  . LIPOMA EXCISION     forehead  . LUMBAR LAMINECTOMY N/A 04/25/2019   Procedure: LEFT L2-3 MICRODISCECTOMY, BILATERAL L5-S1 PARTIAL HEMILAMINECTOMY;  Surgeon: Jessy Oto, MD;  Location: [facility];  Service:  Orthopedics;  Laterality: N/A;  . UPPER GASTROINTESTINAL ENDOSCOPY    . UTERINE FIBROID SURGERY     Social History   Occupational History  . Occupation: IT sales professional: [employer]  Tobacco Use  . Smoking status: Never Smoker  . Smokeless tobacco: Never Used  Substance and Sexual Activity  . Alcohol use: No    Alcohol/week: 0.0 standard drinks  . Drug use: No  . Sexual activity: Not on file

## 2019-05-12 NOTE — Patient Instructions (Signed)
Plan: Avoid frequent bending and stooping  No lifting greater than 10 lbs. May use ice or moist heat for pain. Weight loss is of benefit. Best medication for lumbar disc disease is arthritis medications like motrin, celebrex and naprosyn. Exercise is important to improve your indurance and does allow people to function better inspite of back pain. Work with PT at home for now. Call when your prescription is running low and we with renew. At 6 weeks post op you will need to stop narcotic medications and stay with arthritis medications

## 2019-05-14 DIAGNOSIS — M069 Rheumatoid arthritis, unspecified: Secondary | ICD-10-CM | POA: Diagnosis not present

## 2019-05-14 DIAGNOSIS — G8929 Other chronic pain: Secondary | ICD-10-CM | POA: Diagnosis not present

## 2019-05-14 DIAGNOSIS — M19042 Primary osteoarthritis, left hand: Secondary | ICD-10-CM | POA: Diagnosis not present

## 2019-05-14 DIAGNOSIS — M47817 Spondylosis without myelopathy or radiculopathy, lumbosacral region: Secondary | ICD-10-CM | POA: Diagnosis not present

## 2019-05-14 DIAGNOSIS — M48062 Spinal stenosis, lumbar region with neurogenic claudication: Secondary | ICD-10-CM | POA: Diagnosis not present

## 2019-05-14 DIAGNOSIS — M19071 Primary osteoarthritis, right ankle and foot: Secondary | ICD-10-CM | POA: Diagnosis not present

## 2019-05-14 DIAGNOSIS — M19041 Primary osteoarthritis, right hand: Secondary | ICD-10-CM | POA: Diagnosis not present

## 2019-05-14 DIAGNOSIS — K5900 Constipation, unspecified: Secondary | ICD-10-CM | POA: Diagnosis not present

## 2019-05-14 DIAGNOSIS — M5126 Other intervertebral disc displacement, lumbar region: Secondary | ICD-10-CM | POA: Diagnosis not present

## 2019-05-14 DIAGNOSIS — I10 Essential (primary) hypertension: Secondary | ICD-10-CM | POA: Diagnosis not present

## 2019-05-14 DIAGNOSIS — M19072 Primary osteoarthritis, left ankle and foot: Secondary | ICD-10-CM | POA: Diagnosis not present

## 2019-05-14 DIAGNOSIS — G5603 Carpal tunnel syndrome, bilateral upper limbs: Secondary | ICD-10-CM | POA: Diagnosis not present

## 2019-05-14 DIAGNOSIS — E668 Other obesity: Secondary | ICD-10-CM | POA: Diagnosis not present

## 2019-05-14 DIAGNOSIS — Z4789 Encounter for other orthopedic aftercare: Secondary | ICD-10-CM | POA: Diagnosis not present

## 2019-05-14 DIAGNOSIS — M170 Bilateral primary osteoarthritis of knee: Secondary | ICD-10-CM | POA: Diagnosis not present

## 2019-05-14 DIAGNOSIS — D649 Anemia, unspecified: Secondary | ICD-10-CM | POA: Diagnosis not present

## 2019-05-15 ENCOUNTER — Telehealth: Payer: Self-pay | Admitting: Specialist

## 2019-05-15 NOTE — Telephone Encounter (Signed)
Patient called and stated she is coming today to get the remaining staples out @1 :00.  Please call patient asap so she can prepare to get here.  Please call [phone]

## 2019-05-15 NOTE — Telephone Encounter (Signed)
I called and spoke with patient and advised that PT can take thee staples out so she dont have to come to the office.  She states that she wants the office to take them out.  I advised that she will have to wait for me to come back from lunch which is at 1:30.  She is aware of this and she understands that she will have to wait, I tried to get her to come at 1:30 instead of 1pm.   She states that she doesn't understand why the lower incision was not removed, I advised that with back surgery there is typically only one incision, that I have come across another patient that has had 2 incisions, and that her pants was covering it so there fore it only appeared to be the one incision.  However when she comes in today we will get these last staples out and get her taken care of.

## 2019-05-16 DIAGNOSIS — M19042 Primary osteoarthritis, left hand: Secondary | ICD-10-CM | POA: Diagnosis not present

## 2019-05-16 DIAGNOSIS — Z4789 Encounter for other orthopedic aftercare: Secondary | ICD-10-CM | POA: Diagnosis not present

## 2019-05-16 DIAGNOSIS — E668 Other obesity: Secondary | ICD-10-CM | POA: Diagnosis not present

## 2019-05-16 DIAGNOSIS — D649 Anemia, unspecified: Secondary | ICD-10-CM | POA: Diagnosis not present

## 2019-05-16 DIAGNOSIS — M5126 Other intervertebral disc displacement, lumbar region: Secondary | ICD-10-CM | POA: Diagnosis not present

## 2019-05-16 DIAGNOSIS — K5900 Constipation, unspecified: Secondary | ICD-10-CM | POA: Diagnosis not present

## 2019-05-16 DIAGNOSIS — I10 Essential (primary) hypertension: Secondary | ICD-10-CM | POA: Diagnosis not present

## 2019-05-16 DIAGNOSIS — M19041 Primary osteoarthritis, right hand: Secondary | ICD-10-CM | POA: Diagnosis not present

## 2019-05-16 DIAGNOSIS — M069 Rheumatoid arthritis, unspecified: Secondary | ICD-10-CM | POA: Diagnosis not present

## 2019-05-16 DIAGNOSIS — G8929 Other chronic pain: Secondary | ICD-10-CM | POA: Diagnosis not present

## 2019-05-16 DIAGNOSIS — M19071 Primary osteoarthritis, right ankle and foot: Secondary | ICD-10-CM | POA: Diagnosis not present

## 2019-05-16 DIAGNOSIS — M47817 Spondylosis without myelopathy or radiculopathy, lumbosacral region: Secondary | ICD-10-CM | POA: Diagnosis not present

## 2019-05-16 DIAGNOSIS — M48062 Spinal stenosis, lumbar region with neurogenic claudication: Secondary | ICD-10-CM | POA: Diagnosis not present

## 2019-05-16 DIAGNOSIS — M170 Bilateral primary osteoarthritis of knee: Secondary | ICD-10-CM | POA: Diagnosis not present

## 2019-05-16 DIAGNOSIS — G5603 Carpal tunnel syndrome, bilateral upper limbs: Secondary | ICD-10-CM | POA: Diagnosis not present

## 2019-05-16 DIAGNOSIS — M19072 Primary osteoarthritis, left ankle and foot: Secondary | ICD-10-CM | POA: Diagnosis not present

## 2019-05-20 NOTE — Discharge Summary (Signed)
Patient ID: Lisa Briggs MRN: [id number] DOB/AGE: 11/16/1960 59 y.o.  Admit date: 04/25/2019 Discharge date: 04/26/2019  Admission Diagnoses:  Principal Problem:   Lumbar disc herniation with radiculopathy Active Problems:   Spinal stenosis of lumbar region   Status post lumbar laminectomy   Discharge Diagnoses:  Principal Problem:   Lumbar disc herniation with radiculopathy Active Problems:   Spinal stenosis of lumbar region   Status post lumbar laminectomy  status post Procedure(s): LEFT L2-3 MICRODISCECTOMY, BILATERAL L5-S1 PARTIAL HEMILAMINECTOMY  Past Medical History:  Diagnosis Date  . Allergy   . Anemia    iron therapy for years as of 10/12; normal Hgb 08/2013  . Anxiety   . Arthritis   . Chest pain 04/05/2011   cardiac eval, normal treadmill stress test, Dr. Tollie Eth  . Chronic back pain   . Constipation   . Farsightedness    wears glasses, [facility]  . 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 [name], [facility]  . GERD (gastroesophageal reflux disease)   . History of uterine fibroid   . Hyperlipidemia   . Hypertension   . Paresthesia 09/2014   initially thought to be TIA, neurology consult in 12/2014 with other non TIA considerations.    . Polyarthralgia    normal rheumatoid screen 01/2012  . TIA (transient ischemic attack) 2015/16    Surgeries: Procedure(s): LEFT L2-3 MICRODISCECTOMY, BILATERAL L5-S1 PARTIAL HEMILAMINECTOMY on 04/25/2019   Consultants:   Discharged Condition: Improved  Hospital Course: Lisa Briggs is an 59 y.o. female who was admitted 04/25/2019 for operative treatment of Lumbar disc herniation with radiculopathy. Patient failed conservative treatments (please see the history and physical for the specifics) and had severe unremitting pain that affects sleep, daily activities and work/hobbies. After pre-op clearance, the patient was taken to the operating room on 04/25/2019 and underwent   Procedure(s): LEFT L2-3 MICRODISCECTOMY, BILATERAL L5-S1 PARTIAL HEMILAMINECTOMY.    Patient was given perioperative antibiotics:  Anti-infectives (From admission, onward)   Start     Dose/Rate Route Frequency Ordered Stop   04/25/19 2200  hydroxychloroquine (PLAQUENIL) tablet 200 mg  Status:  Discontinued     200 mg Oral 2 times daily 04/25/19 1937 04/26/19 1529   04/25/19 2200  ceFAZolin (ANCEF) IVPB 2g/100 mL premix     2 g 200 mL/hr over 30 Minutes Intravenous Every 8 hours 04/25/19 1937 04/26/19 0547   04/25/19 1215  ceFAZolin (ANCEF) IVPB 2g/100 mL premix     2 g 200 mL/hr over 30 Minutes Intravenous On call to O.R. 04/25/19 1045 04/25/19 1410       Patient was given sequential compression devices and early ambulation to prevent DVT.   Patient benefited maximally from hospital stay and there were no complications. At the time of discharge, the patient was urinating/moving their bowels without difficulty, tolerating a regular diet, pain is controlled with oral pain medications and they have been cleared by PT/OT.   Recent vital signs: No data found.   Recent laboratory studies: No results for input(s): WBC, HGB, HCT, PLT, NA, K, CL, CO2, BUN, CREATININE, GLUCOSE, INR, CALCIUM in the last 72 hours.  Invalid input(s): PT, 2   Discharge Medications:   Allergies as of 04/26/2019      Reactions   Kiwi Extract Hives, Itching   Mucinex Dm [dm-guaifenesin Er] Nausea Only   Messes up her stomach. Pt report on 01/21/15   Peach [prunus Persica] Itching   Peach peeling makes pt itch  Medication List    TAKE these medications   aspirin EC 81 MG tablet Take 1 tablet (81 mg total) by mouth daily.   cholecalciferol 25 MCG (1000 UNIT) tablet Commonly known as: VITAMIN D3 Take 1 tablet (1,000 Units total) by mouth daily.   cyclobenzaprine 10 MG tablet Commonly known as: FLEXERIL TAKE 1 TABLET TWICE A DAY AS NEEDED FOR MUSCLE SPASMS What changed: See the new instructions.    Dexilant 60 MG capsule Generic drug: dexlansoprazole Take 1 capsule (60 mg total) by mouth daily.   diclofenac sodium 1 % Gel Commonly known as: VOLTAREN APPLY 2 GRAMS TO AFFECTED AREA 4 TIMES A DAY What changed: See the new instructions.   docusate sodium 100 MG capsule Commonly known as: COLACE Take 1 capsule (100 mg total) by mouth 2 (two) times daily.   famotidine 20 MG tablet Commonly known as: PEPCID TAKE 1 TABLET DAILY AT BEDTIME What changed:   how much to take  how to take this  when to take this  additional instructions   gabapentin 300 MG capsule Commonly known as: NEURONTIN Take 1 capsule (300 mg total) by mouth 3 (three) times daily.   hydrochlorothiazide 12.5 MG tablet Commonly known as: HYDRODIURIL Take 1 tablet (12.5 mg total) by mouth daily.   hydrocortisone 1 % Crea Commonly known as: PROCTOCORT Apply 1 application topically daily.   linaclotide 145 MCG Caps capsule Commonly known as: Linzess Take 1 capsule (145 mcg total) by mouth daily before breakfast.   loratadine 10 MG tablet Commonly known as: CLARITIN Take 1 tablet (10 mg total) by mouth daily.   losartan 50 MG tablet Commonly known as: COZAAR Take 50 mg by mouth daily.   potassium chloride 10 MEQ tablet Commonly known as: KLOR-CON Take 1 tablet (10 mEq total) by mouth 2 (two) times daily.   Rollator Ultra-Light Misc Use as directed.   simvastatin 20 MG tablet Commonly known as: ZOCOR TAKE 1 TABLET DAILY   venlafaxine 37.5 MG tablet Commonly known as: EFFEXOR Take 1 tablet (37.5 mg total) by mouth daily.       Diagnostic Studies: DG Lumbar Spine 2-3 Views  Result Date: 04/25/2019 CLINICAL DATA:  L2-L3 micro diskectomy, L5-S1 laminectomy EXAM: LUMBAR SPINE - 2-3 VIEW COMPARISON:  Three cross-table lateral images obtained intraoperatively are compared to prior studies of 01/31/2019 and 11/12/2018 FINDINGS: Exam from 11/12/2018 demonstrates presence of 5 non-rib-bearing  lumbar type vertebra. Image # 1 at 123456 hours: Metallic probes via dorsal approach project dorsal to the L2-L3 disc space and the mid L4 level. Image #2 at 1500 hours: Surgical instruments project dorsal to the mid L2 level and the L5-S1 disc space level. Image #3 at 1645 hours: Curved metallic probes project at the L2-L3 disc space and L5-S1 disc space levels. IMPRESSION: Intraoperative lumbar localization images as above. Electronically Signed   By: Lavonia Dana M.D.   On: 04/25/2019 17:14    Discharge Instructions    Ambulatory referral to Occupational Therapy   Complete by: As directed    Ambulatory referral to Physical Therapy   Complete by: As directed    Call MD / Call 911   Complete by: As directed    If you experience chest pain or shortness of breath, CALL 911 and be transported to the hospital emergency room.  If you develope a fever above 101 F, pus (white drainage) or increased drainage or redness at the wound, or calf pain, call your surgeon's office.   Constipation Prevention  Complete by: As directed    Drink plenty of fluids.  Prune juice may be helpful.  You may use a stool softener, such as Colace (over the counter) 100 mg twice a day.  Use MiraLax (over the counter) for constipation as needed.   Diet - low sodium heart healthy   Complete by: As directed    Discharge instructions   Complete by: As directed    No lifting greater than 10 lbs. Avoid bending, stooping and twisting. Walk in house for first week them may start to get out slowly increasing distance up to one mile by 3 weeks post op. Keep incision dry for 3 days, may use tegaderm or similar water impervious dressing.   Driving restrictions   Complete by: As directed    No driving for 4 weeks   Increase activity slowly as tolerated   Complete by: As directed    Lifting restrictions   Complete by: As directed    No lifting for 6 weeks      Follow-up Information    Jessy Oto, MD In 2 weeks.    Specialty: Orthopedic Surgery Why: For wound re-check Contact information: [place] [address] [phone]           Discharge Plan:  discharge to home  Disposition:     Signed: Benjiman Core  05/20/2019, 10:12 AM

## 2019-05-29 ENCOUNTER — Other Ambulatory Visit: Payer: Self-pay | Admitting: Surgery

## 2019-05-29 DIAGNOSIS — G5603 Carpal tunnel syndrome, bilateral upper limbs: Secondary | ICD-10-CM | POA: Diagnosis not present

## 2019-05-29 DIAGNOSIS — G8929 Other chronic pain: Secondary | ICD-10-CM | POA: Diagnosis not present

## 2019-05-29 DIAGNOSIS — Z4789 Encounter for other orthopedic aftercare: Secondary | ICD-10-CM | POA: Diagnosis not present

## 2019-05-29 DIAGNOSIS — M19071 Primary osteoarthritis, right ankle and foot: Secondary | ICD-10-CM | POA: Diagnosis not present

## 2019-05-29 DIAGNOSIS — M48062 Spinal stenosis, lumbar region with neurogenic claudication: Secondary | ICD-10-CM | POA: Diagnosis not present

## 2019-05-29 DIAGNOSIS — M170 Bilateral primary osteoarthritis of knee: Secondary | ICD-10-CM | POA: Diagnosis not present

## 2019-05-29 DIAGNOSIS — D649 Anemia, unspecified: Secondary | ICD-10-CM | POA: Diagnosis not present

## 2019-05-29 DIAGNOSIS — M5126 Other intervertebral disc displacement, lumbar region: Secondary | ICD-10-CM | POA: Diagnosis not present

## 2019-05-29 DIAGNOSIS — M47817 Spondylosis without myelopathy or radiculopathy, lumbosacral region: Secondary | ICD-10-CM | POA: Diagnosis not present

## 2019-05-29 DIAGNOSIS — M19042 Primary osteoarthritis, left hand: Secondary | ICD-10-CM | POA: Diagnosis not present

## 2019-05-29 DIAGNOSIS — E668 Other obesity: Secondary | ICD-10-CM | POA: Diagnosis not present

## 2019-05-29 DIAGNOSIS — I10 Essential (primary) hypertension: Secondary | ICD-10-CM | POA: Diagnosis not present

## 2019-05-29 DIAGNOSIS — K5900 Constipation, unspecified: Secondary | ICD-10-CM | POA: Diagnosis not present

## 2019-05-29 DIAGNOSIS — M069 Rheumatoid arthritis, unspecified: Secondary | ICD-10-CM | POA: Diagnosis not present

## 2019-05-29 DIAGNOSIS — M19041 Primary osteoarthritis, right hand: Secondary | ICD-10-CM | POA: Diagnosis not present

## 2019-05-29 DIAGNOSIS — M19072 Primary osteoarthritis, left ankle and foot: Secondary | ICD-10-CM | POA: Diagnosis not present

## 2019-05-29 NOTE — Telephone Encounter (Signed)
Patient called. She would like a refill on hydrocodone. Her call back number is [phone]

## 2019-05-30 DIAGNOSIS — M19041 Primary osteoarthritis, right hand: Secondary | ICD-10-CM | POA: Diagnosis not present

## 2019-05-30 DIAGNOSIS — G8929 Other chronic pain: Secondary | ICD-10-CM | POA: Diagnosis not present

## 2019-05-30 DIAGNOSIS — Z4789 Encounter for other orthopedic aftercare: Secondary | ICD-10-CM | POA: Diagnosis not present

## 2019-05-30 DIAGNOSIS — E668 Other obesity: Secondary | ICD-10-CM | POA: Diagnosis not present

## 2019-05-30 DIAGNOSIS — K5900 Constipation, unspecified: Secondary | ICD-10-CM | POA: Diagnosis not present

## 2019-05-30 DIAGNOSIS — I10 Essential (primary) hypertension: Secondary | ICD-10-CM | POA: Diagnosis not present

## 2019-05-30 DIAGNOSIS — G5603 Carpal tunnel syndrome, bilateral upper limbs: Secondary | ICD-10-CM | POA: Diagnosis not present

## 2019-05-30 DIAGNOSIS — M19071 Primary osteoarthritis, right ankle and foot: Secondary | ICD-10-CM | POA: Diagnosis not present

## 2019-05-30 DIAGNOSIS — M069 Rheumatoid arthritis, unspecified: Secondary | ICD-10-CM | POA: Diagnosis not present

## 2019-05-30 DIAGNOSIS — M170 Bilateral primary osteoarthritis of knee: Secondary | ICD-10-CM | POA: Diagnosis not present

## 2019-05-30 DIAGNOSIS — M19072 Primary osteoarthritis, left ankle and foot: Secondary | ICD-10-CM | POA: Diagnosis not present

## 2019-05-30 DIAGNOSIS — M19042 Primary osteoarthritis, left hand: Secondary | ICD-10-CM | POA: Diagnosis not present

## 2019-05-30 DIAGNOSIS — M48062 Spinal stenosis, lumbar region with neurogenic claudication: Secondary | ICD-10-CM | POA: Diagnosis not present

## 2019-05-30 DIAGNOSIS — M47817 Spondylosis without myelopathy or radiculopathy, lumbosacral region: Secondary | ICD-10-CM | POA: Diagnosis not present

## 2019-05-30 DIAGNOSIS — M5126 Other intervertebral disc displacement, lumbar region: Secondary | ICD-10-CM | POA: Diagnosis not present

## 2019-05-30 DIAGNOSIS — D649 Anemia, unspecified: Secondary | ICD-10-CM | POA: Diagnosis not present

## 2019-05-30 MED ORDER — HYDROCODONE-ACETAMINOPHEN 10-325 MG PO TABS: 1.0000 | ORAL_TABLET | Freq: Four times a day (QID) | ORAL | 0 refills | Status: AC | PRN

## 2019-06-02 ENCOUNTER — Telehealth: Payer: Self-pay | Admitting: Specialist

## 2019-06-02 ENCOUNTER — Other Ambulatory Visit: Payer: Self-pay | Admitting: Medical

## 2019-06-02 ENCOUNTER — Encounter: Payer: Self-pay | Admitting: Medical

## 2019-06-02 DIAGNOSIS — Z1231 Encounter for screening mammogram for malignant neoplasm of breast: Secondary | ICD-10-CM

## 2019-06-02 NOTE — Telephone Encounter (Signed)
Lisa Briggs from [place] at [facility] called  He is requesting an extension of care order for 2x this week. Discharging after this week.   Call back number: [phone]

## 2019-06-02 NOTE — Telephone Encounter (Signed)
I called and gave verbal auth for extension for PT x2 this week.

## 2019-06-03 ENCOUNTER — Ambulatory Visit: Payer: BC Managed Care – PPO | Admitting: Specialist

## 2019-06-03 DIAGNOSIS — M170 Bilateral primary osteoarthritis of knee: Secondary | ICD-10-CM | POA: Diagnosis not present

## 2019-06-03 DIAGNOSIS — G5603 Carpal tunnel syndrome, bilateral upper limbs: Secondary | ICD-10-CM | POA: Diagnosis not present

## 2019-06-03 DIAGNOSIS — M19041 Primary osteoarthritis, right hand: Secondary | ICD-10-CM | POA: Diagnosis not present

## 2019-06-03 DIAGNOSIS — K5900 Constipation, unspecified: Secondary | ICD-10-CM | POA: Diagnosis not present

## 2019-06-03 DIAGNOSIS — M069 Rheumatoid arthritis, unspecified: Secondary | ICD-10-CM | POA: Diagnosis not present

## 2019-06-03 DIAGNOSIS — E668 Other obesity: Secondary | ICD-10-CM | POA: Diagnosis not present

## 2019-06-03 DIAGNOSIS — M19072 Primary osteoarthritis, left ankle and foot: Secondary | ICD-10-CM | POA: Diagnosis not present

## 2019-06-03 DIAGNOSIS — M48062 Spinal stenosis, lumbar region with neurogenic claudication: Secondary | ICD-10-CM | POA: Diagnosis not present

## 2019-06-03 DIAGNOSIS — M47817 Spondylosis without myelopathy or radiculopathy, lumbosacral region: Secondary | ICD-10-CM | POA: Diagnosis not present

## 2019-06-03 DIAGNOSIS — G8929 Other chronic pain: Secondary | ICD-10-CM | POA: Diagnosis not present

## 2019-06-03 DIAGNOSIS — I10 Essential (primary) hypertension: Secondary | ICD-10-CM | POA: Diagnosis not present

## 2019-06-03 DIAGNOSIS — M19071 Primary osteoarthritis, right ankle and foot: Secondary | ICD-10-CM | POA: Diagnosis not present

## 2019-06-03 DIAGNOSIS — M5126 Other intervertebral disc displacement, lumbar region: Secondary | ICD-10-CM | POA: Diagnosis not present

## 2019-06-03 DIAGNOSIS — M19042 Primary osteoarthritis, left hand: Secondary | ICD-10-CM | POA: Diagnosis not present

## 2019-06-03 DIAGNOSIS — Z4789 Encounter for other orthopedic aftercare: Secondary | ICD-10-CM | POA: Diagnosis not present

## 2019-06-03 DIAGNOSIS — D649 Anemia, unspecified: Secondary | ICD-10-CM | POA: Diagnosis not present

## 2019-06-04 DIAGNOSIS — E668 Other obesity: Secondary | ICD-10-CM | POA: Diagnosis not present

## 2019-06-04 DIAGNOSIS — M19042 Primary osteoarthritis, left hand: Secondary | ICD-10-CM | POA: Diagnosis not present

## 2019-06-04 DIAGNOSIS — M48062 Spinal stenosis, lumbar region with neurogenic claudication: Secondary | ICD-10-CM | POA: Diagnosis not present

## 2019-06-04 DIAGNOSIS — M19071 Primary osteoarthritis, right ankle and foot: Secondary | ICD-10-CM | POA: Diagnosis not present

## 2019-06-04 DIAGNOSIS — I10 Essential (primary) hypertension: Secondary | ICD-10-CM | POA: Diagnosis not present

## 2019-06-04 DIAGNOSIS — M19041 Primary osteoarthritis, right hand: Secondary | ICD-10-CM | POA: Diagnosis not present

## 2019-06-04 DIAGNOSIS — G5603 Carpal tunnel syndrome, bilateral upper limbs: Secondary | ICD-10-CM | POA: Diagnosis not present

## 2019-06-04 DIAGNOSIS — Z4789 Encounter for other orthopedic aftercare: Secondary | ICD-10-CM | POA: Diagnosis not present

## 2019-06-04 DIAGNOSIS — K5900 Constipation, unspecified: Secondary | ICD-10-CM | POA: Diagnosis not present

## 2019-06-04 DIAGNOSIS — G8929 Other chronic pain: Secondary | ICD-10-CM | POA: Diagnosis not present

## 2019-06-04 DIAGNOSIS — D649 Anemia, unspecified: Secondary | ICD-10-CM | POA: Diagnosis not present

## 2019-06-04 DIAGNOSIS — M47817 Spondylosis without myelopathy or radiculopathy, lumbosacral region: Secondary | ICD-10-CM | POA: Diagnosis not present

## 2019-06-04 DIAGNOSIS — M19072 Primary osteoarthritis, left ankle and foot: Secondary | ICD-10-CM | POA: Diagnosis not present

## 2019-06-04 DIAGNOSIS — M5126 Other intervertebral disc displacement, lumbar region: Secondary | ICD-10-CM | POA: Diagnosis not present

## 2019-06-04 DIAGNOSIS — M069 Rheumatoid arthritis, unspecified: Secondary | ICD-10-CM | POA: Diagnosis not present

## 2019-06-04 DIAGNOSIS — M170 Bilateral primary osteoarthritis of knee: Secondary | ICD-10-CM | POA: Diagnosis not present

## 2019-06-05 ENCOUNTER — Other Ambulatory Visit: Payer: BC Managed Care – PPO

## 2019-06-05 ENCOUNTER — Other Ambulatory Visit: Payer: Self-pay

## 2019-06-05 ENCOUNTER — Ambulatory Visit: Payer: BC Managed Care – PPO

## 2019-06-05 DIAGNOSIS — I429 Cardiomyopathy, unspecified: Secondary | ICD-10-CM

## 2019-06-09 ENCOUNTER — Encounter: Payer: Self-pay | Admitting: Specialist

## 2019-06-09 ENCOUNTER — Ambulatory Visit (INDEPENDENT_AMBULATORY_CARE_PROVIDER_SITE_OTHER): Payer: BC Managed Care – PPO | Admitting: Specialist

## 2019-06-09 ENCOUNTER — Ambulatory Visit: Payer: BC Managed Care – PPO | Admitting: Specialist

## 2019-06-09 ENCOUNTER — Other Ambulatory Visit: Payer: Self-pay

## 2019-06-09 VITALS — BP 124/81 | HR 77 | Ht 69.0 in | Wt 230.0 lb

## 2019-06-09 DIAGNOSIS — Z9889 Other specified postprocedural states: Secondary | ICD-10-CM

## 2019-06-09 MED ORDER — MELOXICAM 15 MG PO TABS: 15.0000 mg | ORAL_TABLET | Freq: Every day | ORAL | 3 refills | Status: DC

## 2019-06-09 NOTE — Patient Instructions (Signed)
Avoid frequent bending and stooping  No lifting greater than 10 lbs. May use ice or moist heat for pain. Weight loss is of benefit. Best medication for lumbar disc disease is arthritis medications like mobic, motrin, celebrex and naprosyn. Exercise is important to improve your indurance and does allow people to function better inspite of back pain.

## 2019-06-09 NOTE — Progress Notes (Signed)
Post-Op Visit Note   [name]           Date of Birth: 1961-01-04           MRN: [id number] Visit Date: 06/09/2019 PCP: Carlena Hurl, PA-C   Assessment & Plan: 6 weeks post op Lumbar laminectomy with left L2-3 microdiscectomy and bilateral partial  Hemilaminectomy    Chief Complaint:  Chief Complaint  Patient presents with  . Lower Back - Routine Post Op  now 6 weeks post lumbar laminectomies, she is feeling pain in her legs at nighttime and pain that radiates into the left leg. Motor without focal deficit. Left leg is neurovascular normal.  Visit Diagnoses: No diagnosis found.  Plan:Avoid frequent bending and stooping  No lifting greater than 10 lbs. May use ice or moist heat for pain. Weight loss is of benefit. Best medication for lumbar disc disease is arthritis medications like motrin, celebrex and naprosyn. Exercise is important to improve your indurance and does allow people to function better inspite of back pain.    Follow-Up Instructions: No follow-ups on file.   Orders:  No orders of the defined types were placed in this encounter.  No orders of the defined types were placed in this encounter.   Imaging: No results found.  PMFS History: Patient Active Problem List   Diagnosis Date Noted  . 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    Priority: High    Class: Chronic  . Spinal stenosis of lumbar region 04/25/2019    Priority: High    Class: Chronic  . Status post lumbar laminectomy 04/25/2019  . Neck pain 11/12/2018  . Chronic pain of right knee 09/13/2018  . Bruising 09/13/2018  . SOB (shortness of breath) 08/30/2018  . Chronic pain of left knee 07/08/2018  . Chronic hip pain 07/08/2018  . Edema 07/08/2018  . Left foot pain 04/08/2018  . Olecranon bursitis of left elbow 04/05/2018  . Leg swelling 12/05/2017  . Need for shingles vaccine 12/05/2017  . Primary osteoarthritis of both hands 11/23/2017  . Rheumatoid  factor positive 11/23/2017  . Primary osteoarthritis of both knees 11/23/2017  . Primary osteoarthritis of both feet 11/23/2017  . DDD (degenerative disc disease), lumbar 11/23/2017  . Vaccine counseling 08/13/2017  . Polyarthralgia 08/13/2017  . Joint stiffness 08/13/2017  . Sensitive skin 01/23/2017  . Need for influenza vaccination 01/23/2017  . Proteinuria 01/23/2017  . History of gastrointestinal stromal tumor (GIST) 04/20/2016  . History of TIA (transient ischemic attack) 04/20/2016  . Screening for breast cancer 04/20/2016  . Estrogen deficiency 04/20/2016  . Chronic maxillary sinusitis 04/20/2016  . Impaired fasting blood sugar 04/20/2016  . Constipation 04/20/2016  . History of fall 04/20/2016  . Chronic radicular lumbar pain 01/27/2016  . Encounter for health maintenance examination in adult 03/22/2015  . Paresthesia 03/22/2015  . Cognitive decline 03/22/2015  . Screening for cervical cancer 03/22/2015  . Vitamin D deficiency 03/22/2015  . History of uterine leiomyoma 03/22/2015  . Gastroesophageal reflux disease without esophagitis 03/02/2014  . Chronic nausea 03/02/2014  . Chronic abdominal pain 03/02/2014  . Rhinitis, allergic 03/02/2014  . Essential hypertension 03/02/2014  . Hyperlipidemia 03/02/2014  . Obesity with serious comorbidity 01/16/2012   Past Medical History:  Diagnosis Date  . Allergy   . Anemia    iron therapy for years as of 10/12; normal Hgb 08/2013  . Anxiety   . Arthritis   . Chest pain 04/05/2011   cardiac eval, normal treadmill  stress test, Dr. Tollie Eth  . Chronic back pain   . Constipation   . Farsightedness    wears glasses, Eye care center  . 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 [name], [facility]  . GERD (gastroesophageal reflux disease)   . History of uterine fibroid   . Hyperlipidemia   . Hypertension   . Paresthesia 09/2014   initially thought to be TIA, neurology consult in  12/2014 with other non TIA considerations.    . Polyarthralgia    normal rheumatoid screen 01/2012  . TIA (transient ischemic attack) 2015/16    Family History  Problem Relation Age of Onset  . Hypertension Mother   . Arthritis Mother   . GER disease Mother   . Glaucoma Mother   . Breast cancer Mother   . Stroke Father   . Breast cancer Sister        breast cancer dx late 76s  . Hypertension Sister   . Colon cancer Brother 85  . Diabetes Brother   . Diabetes Maternal Aunt   . Heart disease Maternal Aunt   . Heart disease Maternal Grandmother   . Heart disease Maternal Grandfather   . Heart disease Maternal Uncle   . Stroke Maternal Uncle   . Leukemia Sister   . Hypertension Sister   . Healthy Son   . Colon polyps Neg Hx   . Esophageal cancer Neg Hx   . Stomach cancer Neg Hx   . Rectal cancer Neg Hx     Past Surgical History:  Procedure Laterality Date  . COLONOSCOPY  01/2014   diverticulosis, othwerise normal - Dr. Owens Loffler  . ESOPHAGOGASTRODUODENOSCOPY  2013   Dr. Benson Norway, gastritis  . ESOPHAGOGASTRODUODENOSCOPY  [id number]  . EUS N/A 04/16/2014   Procedure: UPPER ENDOSCOPIC ULTRASOUND (EUS) LINEAR;  Surgeon: Milus Banister, MD;  Location: [facility];  Service: Endoscopy;  Laterality: N/A;  . gall stone surgery    . KNEE ARTHROSCOPY Left   . LAPAROSCOPIC GASTRIC RESECTION N/A 06/09/2014   Procedure: LAPAROSCOPIC GASTRIC MASS RESECTION;  Surgeon: [name], MD;  Location: [facility];  Service: General;  Laterality: N/A;  . LIPOMA EXCISION     forehead  . LUMBAR LAMINECTOMY N/A 04/25/2019   Procedure: LEFT L2-3 MICRODISCECTOMY, BILATERAL L5-S1 PARTIAL HEMILAMINECTOMY;  Surgeon: Jessy Oto, MD;  Location: [place];  Service: Orthopedics;  Laterality: N/A;  . UPPER GASTROINTESTINAL ENDOSCOPY    . UTERINE FIBROID SURGERY     Social History   Occupational History  . Occupation: IT sales professional: [employer]  Tobacco Use  . Smoking status: Never Smoker  .  Smokeless tobacco: Never Used  Substance and Sexual Activity  . Alcohol use: No    Alcohol/week: 0.0 standard drinks  . Drug use: No  . Sexual activity: Not on file

## 2019-06-12 ENCOUNTER — Ambulatory Visit: Payer: BC Managed Care – PPO | Admitting: Physical Therapy

## 2019-06-12 ENCOUNTER — Other Ambulatory Visit: Payer: Self-pay

## 2019-06-12 ENCOUNTER — Ambulatory Visit: Payer: BC Managed Care – PPO | Attending: Specialist

## 2019-06-12 DIAGNOSIS — R262 Difficulty in walking, not elsewhere classified: Secondary | ICD-10-CM | POA: Diagnosis not present

## 2019-06-12 DIAGNOSIS — M25651 Stiffness of right hip, not elsewhere classified: Secondary | ICD-10-CM | POA: Diagnosis not present

## 2019-06-12 DIAGNOSIS — M25652 Stiffness of left hip, not elsewhere classified: Secondary | ICD-10-CM

## 2019-06-12 DIAGNOSIS — G8929 Other chronic pain: Secondary | ICD-10-CM | POA: Insufficient documentation

## 2019-06-12 DIAGNOSIS — M5441 Lumbago with sciatica, right side: Secondary | ICD-10-CM | POA: Insufficient documentation

## 2019-06-12 DIAGNOSIS — M5442 Lumbago with sciatica, left side: Secondary | ICD-10-CM | POA: Insufficient documentation

## 2019-06-12 NOTE — Therapy (Signed)
[place], [place], [address] Phone: [phone]   Fax:  [phone]  Physical Therapy Evaluation  Patient Details  Name: Lisa Briggs MRN: [id number] Date of Birth: 05-14-1960 Referring Provider (PT): Louanne Skye   Encounter Date: 06/12/2019  PT End of Session - 06/12/19 [id number]    Visit Number  1    Number of Visits  12    Date for PT Re-Evaluation  07/25/19    Authorization Type  [employer]    PT Start Time  1518    PT Stop Time  1610    PT Time Calculation (min)  52 min    Activity Tolerance  Patient limited by pain    Behavior During Therapy  [facility] for tasks assessed/performed       Past Medical History:  Diagnosis Date  . Allergy   . Anemia    iron therapy for years as of 10/12; normal Hgb 08/2013  . Anxiety   . Arthritis   . Chest pain 04/05/2011   cardiac eval, normal treadmill stress test, Dr. Tollie Eth  . Chronic back pain   . Constipation   . Farsightedness    wears glasses, [facility] care center  . 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 [name], [facility]  . GERD (gastroesophageal reflux disease)   . History of uterine fibroid   . Hyperlipidemia   . Hypertension   . Paresthesia 09/2014   initially thought to be TIA, neurology consult in 12/2014 with other non TIA considerations.    . Polyarthralgia    normal rheumatoid screen 01/2012  . TIA (transient ischemic attack) 2015/16    Past Surgical History:  Procedure Laterality Date  . COLONOSCOPY  01/2014   diverticulosis, othwerise normal - Dr. Owens Loffler  . ESOPHAGOGASTRODUODENOSCOPY  2013   Dr. Benson Norway, gastritis  . ESOPHAGOGASTRODUODENOSCOPY  [id number]  . EUS N/A 04/16/2014   Procedure: UPPER ENDOSCOPIC ULTRASOUND (EUS) LINEAR;  Surgeon: Milus Banister, MD;  Location: [facility];  Service: Endoscopy;  Laterality: N/A;  . gall stone surgery    . KNEE ARTHROSCOPY Left   . LAPAROSCOPIC GASTRIC RESECTION  N/A 06/09/2014   Procedure: LAPAROSCOPIC GASTRIC MASS RESECTION;  Surgeon: [name], MD;  Location: [facility];  Service: General;  Laterality: N/A;  . LIPOMA EXCISION     forehead  . LUMBAR LAMINECTOMY N/A 04/25/2019   Procedure: LEFT L2-3 MICRODISCECTOMY, BILATERAL L5-S1 PARTIAL HEMILAMINECTOMY;  Surgeon: Jessy Oto, MD;  Location: [facility];  Service: Orthopedics;  Laterality: N/A;  . UPPER GASTROINTESTINAL ENDOSCOPY    . UTERINE FIBROID SURGERY      There were no vitals filed for this visit.   Subjective Assessment - 06/12/19 1526    Subjective  PAtient presents s/p lumbar laminectomy and miscrodiscectomy on 04/25/2019.  She reports more difficulty than prior to surgery with walking, standing and sitting.  Reports fell last June at work and worked until August when finally out of work.  Wants to improve walking and has started walking program yesterday, but pretty limited.    Limitations  Walking    How long can you sit comfortably?  maybe 45 minutes    How long can you walk comfortably?  3-5 minutes before taking a break and walked 3 times    Patient Stated Goals  Improve walking, get back to work    Currently in Pain?  Yes    Pain Score  0-No pain    Pain  Location  Back    Pain Orientation  Lower    Pain Descriptors / Indicators  Sore;Tender;Pressure    Pain Type  Chronic pain    Pain Radiating Towards  both hips    Pain Onset  More than a month ago    Aggravating Factors   worse at night, walking    Pain Relieving Factors  ice    Multiple Pain Sites  Yes    Pain Score  2    Pain Location  Elbow    Pain Orientation  Right    Pain Descriptors / Indicators  Discomfort;Aching    Pain Type  Chronic pain    Pain Radiating Towards  shouler    Pain Onset  More than a month ago    Pain Frequency  Intermittent    Aggravating Factors   using it    Pain Relieving Factors  rest, stop holding something         [facility] PT Assessment - 06/12/19 0001      Assessment   Medical  Diagnosis  s/p lumbar laminectomy    Referring Provider (PT)  Louanne Skye    Onset Date/Surgical Date  04/25/19    Next MD Visit  07/10/19    Prior Therapy  2019      Precautions   Precautions  Back      Restrictions   Weight Bearing Restrictions  No      Balance Screen   Has the patient fallen in the past 6 months  No    Has the patient had a decrease in activity level because of a fear of falling?   Yes    Is the patient reluctant to leave their home because of a fear of falling?   No      Home Environment   Living Environment  Private residence    Living Arrangements  Spouse/significant other;Children    Available Help at Discharge  Family    Type of [place] to enter    Entrance Stairs-Number of Steps  2&3    Entrance Stairs-Rails  Can reach both    Home Layout  Two level    Alternate Level Stairs-Number of Steps  4&6    Alternate Level Stairs-Rails  Right    [place] - 4 wheels;Cane - single point;Shower seat;Bedside commode;Toilet riser    Additional Comments  lives with spouse and 72 & 68 y/o grandkids       Prior Function   Level of Independence  Independent with community mobility with device;Independent with household mobility with device    Vocation  Workers comp    Leisure  cooking      Cognition   Overall Cognitive Status  Within Functional Limits for tasks assessed      Observation/Other Assessments   Observations  standing with flexed posture at hips hip int rotation, decreased  lordosis    Focus on Therapeutic Outcomes (FOTO)   62% limitation      Sensation   Light Touch  Appears Intact      Posture/Postural Control   Posture/Postural Control  Postural limitations    Postural Limitations  Rounded Shoulders;Forward head;Decreased lumbar lordosis;Flexed trunk      ROM / Strength   AROM / PROM / Strength  Strength;AROM      AROM   Overall AROM   Deficits    AROM Assessment Site  Hip;Lumbar    Right/Left Hip  Left;Right     Right Hip Extension  20   from neutral   Right Hip Flexion  --   WFL   Left Hip Extension  20   from neutral in supine   Left Hip Flexion  --   [facility]   Lumbar Flexion  50% limited    Lumbar Extension  unable to achieve extension      Strength   Overall Strength  Deficits    Strength Assessment Site  Hip;Knee    Right/Left Hip  Right;Left    Right Hip Flexion  2/5    Right Hip ABduction  3+/5    Left Hip Flexion  2/5    Left Hip ABduction  3+/5    Right/Left Knee  Right;Left    Right Knee Flexion  3+/5    Right Knee Extension  4+/5    Left Knee Flexion  3+/5    Left Knee Extension  4/5      Palpation   Palpation comment  tender along paraspinals T10-sacrum; still very tender over incision       Special Tests    Special Tests  Leg LengthTest;Lumbar;Sacrolliac Tests    Lumbar Tests  FABER test    Sacroiliac Tests   Pelvic Compression    Leg length test   Apparent      FABER test   findings  Positive    Side  Right   and Right     Pelvic Dictraction   Findings  Negative      Pelvic Compression   Findings  Negative      Apparent   Comments  L appears equal R in supine      Bed Mobility   Bed Mobility  Sit to Sidelying Right;Rolling Left;Rolling Right    Rolling Right  Supervision/verbal cueing    Rolling Left  Supervision/Verbal cueing    Sit to Sidelying Right  Supervision/Verbal cueing      Transfers   Transfers  Sit to Stand    Sit to Stand  5: Supervision    Stand Pivot Transfers  4: Min assist    Stand Pivot Transfer Details (indicate cue type and reason)  without device needed assist for UE support    Number of Reps  --   increased time, UE support needed     Ambulation/Gait   Ambulation/Gait  Yes    Ambulation/Gait Assistance  6: Modified independent (Device/Increase time)    Ambulation/Gait Assistance Details  increased time with rollator    Ambulation Distance (Feet)  70 Feet    Assistive device  4-wheeled walker    Gait Pattern  Step-through  pattern;Decreased stride length;Trunk flexed;Narrow base of support                Objective measurements completed on examination: See above findings.      [place] Adult PT Treatment/Exercise - 06/12/19 0001      Exercises   Exercises  Lumbar;Knee/Hip      Lumbar Exercises: Stretches   Active Hamstring Stretch  3 reps;20 seconds   seated with leg up on chair    Piriformis Stretch  3 reps;30 seconds    Piriformis Stretch Limitations  seated with figure four technique             PT Education - 06/12/19 1640    Education Details  POC, HEP, walking program, making ice pack    Person(s) Educated  Patient    Methods  Explanation;Demonstration;Handout    Comprehension  Verbalized understanding;Need further instruction       PT Short Term Goals - 06/12/19 1655      PT SHORT TERM GOAL #1   Title  Patient to be independent in initial HEP for flexibility and conditioning    Time  3    Period  Weeks    Status  New    Target Date  07/04/19      PT SHORT TERM GOAL #2   Title  Patient to ambulate with SPC and S x 200' without LOB and pain no greater than 3/10    Time  3    Period  Weeks    Status  New    Target Date  07/04/19      PT SHORT TERM GOAL #3   Title  Patient to demonstrate sit to stand without UE support x 5 reps and no LOB    Time  3    Period  Weeks    Target Date  07/04/19        PT Long Term Goals - 06/12/19 1657      PT LONG TERM GOAL #1   Title  Independent with HEP    Time  6    Period  Weeks    Status  New    Target Date  07/25/19      PT LONG TERM GOAL #2   Title  Improve FOTO outcome measure score to 45% or less impairment    Time  6    Period  Weeks    Status  New    Target Date  07/25/19      PT LONG TERM GOAL #3   Title  Patient to demonstrate hip flexion strength at least 4-/5 for improved mobility and gait.    Time  6    Period  Weeks    Status  New    Target Date  07/25/19      PT LONG TERM GOAL #4   Title   Patient to demonstrate decreased fall risk with 5 times sit to stand in 15 seconds or less    Time  6    Period  Weeks    Status  New    Target Date  07/25/19      PT LONG TERM GOAL #5   Title  Patient to ambulate 20 minutes without rest and pain no greater than 5/10.    Time  6    Period  Weeks    Status  New    Target Date  07/25/19             Plan - 06/12/19 1649    Clinical Impression Statement  Patient presents s/p lumbar laminectomy/discectomy with decreased independence with mobility, decreased activity tolerance, decreased LE strength, decreased hip mobility, decreased sitting/standing and walking tolerance.  Currently 63% limited on FOTO outcome assessment.  She will benfeit from skilled PT in the outpatient setting to progress mobility, flexibility, strength and endurance to maximize tolerance to daily activity and potentially return to work.    Personal Factors and Comorbidities  Fitness;Comorbidity 1;Comorbidity 2;Comorbidity 3+    Comorbidities  Obesity, HTN, TIA, DJD, GERD, allergies    Examination-Activity Limitations  Dressing;Lift;Locomotion Level;Sleep;Stand;Transfers    Examination-Participation Restrictions  Community Activity;Shop;Meal Prep;Cleaning    Stability/Clinical Decision Making  Evolving/Moderate complexity    Clinical Decision Making  Moderate    Rehab Potential  Good    PT Frequency  2x / week    PT Duration  6 weeks    PT Treatment/Interventions  ADLs/Self Care Home Management;Cryotherapy;Electrical Stimulation;Iontophoresis 4mg /ml Dexamethasone;Moist Heat;Ultrasound;Traction;Stair training;DME Instruction;Gait training;Functional mobility training;Therapeutic activities;Therapeutic exercise;Balance training;Patient/family education;Manual techniques;Passive range of motion;Scar mobilization;Taping;Spinal Manipulations;Joint Manipulations    PT Next Visit Plan  Review HEP, aerobic conditioning, gait training, balance assessment (5x sts)    PT Home  Exercise Plan  seated hamstring stretch, seated piriformis stretch    Consulted and Agree with Plan of Care  Patient       Patient will benefit from skilled therapeutic intervention in order to improve the following deficits and impairments:  Abnormal gait, Decreased activity tolerance, Decreased balance, Decreased endurance, Decreased mobility, Decreased strength, Difficulty walking, Hypomobility, Impaired flexibility, Postural dysfunction, Pain, Impaired perceived functional ability  Visit Diagnosis: Chronic bilateral low back pain with bilateral sciatica - Plan: PT plan of care cert/re-cert  Difficulty in walking, not elsewhere classified - Plan: PT plan of care cert/re-cert  Stiffness of left hip, not elsewhere classified - Plan: PT plan of care cert/re-cert  Stiffness of right hip, not elsewhere classified - Plan: PT plan of care cert/re-cert     Problem List Patient Active Problem List   Diagnosis Date Noted  . 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 Spinal stenosis of lumbar region 04/25/2019    Class: Chronic  . Status post lumbar laminectomy 04/25/2019  . Neck pain 11/12/2018  . Chronic pain of right knee 09/13/2018  . Bruising 09/13/2018  . SOB (shortness of breath) 08/30/2018  . Chronic pain of left knee 07/08/2018  . Chronic hip pain 07/08/2018  . Edema 07/08/2018  . Left foot pain 04/08/2018  . Olecranon bursitis of left elbow 04/05/2018  . Leg swelling 12/05/2017  . Need for shingles vaccine 12/05/2017  . Primary osteoarthritis of both hands 11/23/2017  . Rheumatoid factor positive 11/23/2017  . Primary osteoarthritis of both knees 11/23/2017  . Primary osteoarthritis of both feet 11/23/2017  . DDD (degenerative disc disease), lumbar 11/23/2017  . Vaccine counseling 08/13/2017  . Polyarthralgia 08/13/2017  . Joint stiffness 08/13/2017  . Sensitive skin 01/23/2017  . Need for influenza vaccination 01/23/2017  . Proteinuria  01/23/2017  . History of gastrointestinal stromal tumor (GIST) 04/20/2016  . History of TIA (transient ischemic attack) 04/20/2016  . Screening for breast cancer 04/20/2016  . Estrogen deficiency 04/20/2016  . Chronic maxillary sinusitis 04/20/2016  . Impaired fasting blood sugar 04/20/2016  . Constipation 04/20/2016  . History of fall 04/20/2016  . Chronic radicular lumbar pain 01/27/2016  . Encounter for health maintenance examination in adult 03/22/2015  . Paresthesia 03/22/2015  . Cognitive decline 03/22/2015  . Screening for cervical cancer 03/22/2015  . Vitamin D deficiency 03/22/2015  . History of uterine leiomyoma 03/22/2015  . Gastroesophageal reflux disease without esophagitis 03/02/2014  . Chronic nausea 03/02/2014  . Chronic abdominal pain 03/02/2014  . Rhinitis, allergic 03/02/2014  . Essential hypertension 03/02/2014  . Hyperlipidemia 03/02/2014  . Obesity with serious comorbidity 01/16/2012    Reginia Naas, PT 06/12/2019, 5:10 PM  [facility] [address] [place], [place], [address] Phone: [phone]   Fax:  [phone]  Name: Lisa Briggs MRN: [id number] Date of Birth: [birth year]

## 2019-06-12 NOTE — Patient Instructions (Addendum)
Access Code: [email or web address]  URL: [email or web address]  Date: 06/12/2019  Prepared by: Reginia Naas   Exercises Seated Hamstring Stretch (AKA) - 3 reps - 1 sets - 30 sec hold - 1-2x daily - 7x weekly Seated Piriformis Stretch - 3 reps - 1 sets - 30 sec hold - 1-2x daily - 7x weekly  Educated on continuing walking program with seated rests when needed Also on how to make ice pack with rubbing alcohol and water in ziploc bags

## 2019-06-16 ENCOUNTER — Ambulatory Visit: Payer: BC Managed Care – PPO | Attending: Internal Medicine

## 2019-06-16 DIAGNOSIS — Z23 Encounter for immunization: Secondary | ICD-10-CM

## 2019-06-16 NOTE — Progress Notes (Signed)
   Covid-19 Vaccination Clinic  Name:  Lisa Briggs    MRN: [id number] DOB: 05-29-1960  06/16/2019  Lisa Briggs was observed post Covid-19 immunization for 15 minutes without incident. She was provided with Vaccine Information Sheet and instruction to access the V-Safe system.   Lisa Briggs was instructed to call 911 with any severe reactions post vaccine: Marland Kitchen Difficulty breathing  . Swelling of face and throat  . A fast heartbeat  . A bad rash all over body  . Dizziness and weakness   Immunizations Administered    Name Date Dose VIS Date Route   Pfizer COVID-19 Vaccine 06/16/2019  3:34 PM 0.3 mL 03/14/2019 Intramuscular   Manufacturer: [place]   Lot: [id number]   [place]: [id number]

## 2019-06-17 ENCOUNTER — Encounter: Payer: Self-pay | Admitting: Medical

## 2019-06-18 ENCOUNTER — Telehealth: Payer: Self-pay

## 2019-06-18 NOTE — Telephone Encounter (Signed)
LMTCB

## 2019-06-18 NOTE — Telephone Encounter (Signed)
-----   Message from Miquel Dunn, NP sent at 06/09/2019  1:41 PM EST ----- Heart function continues to be mildly weak at 45-50%. She needs follow up visit to further discuss and reevaluate, but not urgent

## 2019-06-19 NOTE — Telephone Encounter (Signed)
Gave results. Patient verbalized understanding. Transferred to front to schedule follow up appointment.

## 2019-06-26 ENCOUNTER — Other Ambulatory Visit: Payer: Self-pay

## 2019-06-26 ENCOUNTER — Ambulatory Visit
Admission: RE | Admit: 2019-06-26 | Discharge: 2019-06-26 | Disposition: A | Discharge status: Home / Self Care | Payer: BC Managed Care – PPO | Source: Ambulatory Visit | Attending: Medical | Admitting: Medical

## 2019-06-26 ENCOUNTER — Ambulatory Visit: Payer: BC Managed Care – PPO | Admitting: Physical Therapy

## 2019-06-26 ENCOUNTER — Encounter: Payer: Self-pay | Admitting: Physical Therapy

## 2019-06-26 DIAGNOSIS — R262 Difficulty in walking, not elsewhere classified: Secondary | ICD-10-CM

## 2019-06-26 DIAGNOSIS — M5441 Lumbago with sciatica, right side: Secondary | ICD-10-CM | POA: Diagnosis not present

## 2019-06-26 DIAGNOSIS — M25651 Stiffness of right hip, not elsewhere classified: Secondary | ICD-10-CM

## 2019-06-26 DIAGNOSIS — M5442 Lumbago with sciatica, left side: Secondary | ICD-10-CM | POA: Diagnosis not present

## 2019-06-26 DIAGNOSIS — M25652 Stiffness of left hip, not elsewhere classified: Secondary | ICD-10-CM

## 2019-06-26 DIAGNOSIS — G8929 Other chronic pain: Secondary | ICD-10-CM

## 2019-06-26 DIAGNOSIS — Z1231 Encounter for screening mammogram for malignant neoplasm of breast: Secondary | ICD-10-CM

## 2019-06-26 NOTE — Therapy (Signed)
[place], [place], [address] Phone: [phone]   Fax:  [phone]  Physical Therapy Treatment  Patient Details  Name: Lisa Briggs MRN: [id number] Date of Birth: December 05, 1960 Referring Provider (PT): Louanne Skye   Encounter Date: 06/26/2019  PT End of Session - 06/26/19 1322    Visit Number  2    Number of Visits  12    Date for PT Re-Evaluation  07/25/19    Authorization Type  BCBS    PT Start Time  1320    PT Stop Time  1407    PT Time Calculation (min)  47 min    Activity Tolerance  Patient limited by pain    Behavior During Therapy  [facility] for tasks assessed/performed       Past Medical History:  Diagnosis Date  . Allergy   . Anemia    iron therapy for years as of 10/12; normal Hgb 08/2013  . Anxiety   . Arthritis   . Chest pain 04/05/2011   cardiac eval, normal treadmill stress test, Dr. Tollie Eth  . Chronic back pain   . Constipation   . Farsightedness    wears glasses, [facility]  . 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 [name], [facility]  . GERD (gastroesophageal reflux disease)   . History of uterine fibroid   . Hyperlipidemia   . Hypertension   . Paresthesia 09/2014   initially thought to be TIA, neurology consult in 12/2014 with other non TIA considerations.    . Polyarthralgia    normal rheumatoid screen 01/2012  . TIA (transient ischemic attack) 2015/16    Past Surgical History:  Procedure Laterality Date  . COLONOSCOPY  01/2014   diverticulosis, othwerise normal - Dr. Owens Loffler  . ESOPHAGOGASTRODUODENOSCOPY  2013   Dr. Benson Norway, gastritis  . ESOPHAGOGASTRODUODENOSCOPY  [id number]  . EUS N/A 04/16/2014   Procedure: UPPER ENDOSCOPIC ULTRASOUND (EUS) LINEAR;  Surgeon: Milus Banister, MD;  Location: [facility];  Service: Endoscopy;  Laterality: N/A;  . gall stone surgery    . KNEE ARTHROSCOPY Left   . LAPAROSCOPIC GASTRIC RESECTION  N/A 06/09/2014   Procedure: LAPAROSCOPIC GASTRIC MASS RESECTION;  Surgeon: [name], MD;  Location: [facility];  Service: General;  Laterality: N/A;  . LIPOMA EXCISION     forehead  . LUMBAR LAMINECTOMY N/A 04/25/2019   Procedure: LEFT L2-3 MICRODISCECTOMY, BILATERAL L5-S1 PARTIAL HEMILAMINECTOMY;  Surgeon: Jessy Oto, MD;  Location: [place];  Service: Orthopedics;  Laterality: N/A;  . UPPER GASTROINTESTINAL ENDOSCOPY    . UTERINE FIBROID SURGERY      There were no vitals filed for this visit.  Subjective Assessment - 06/26/19 1323    Subjective  No pain pre-tx. but still noting intermittent left leg symptoms with prolonged walking and sitting, eased with ice. Also still having some intermittent right elbow symptoms with previous olecranon bursitis pain. Arrives with [facility] today but still using rollator intemittently.    Currently in Pain?  No/denies         [facility] PT Assessment - 06/26/19 0001      Transfers   Comments  5x sit<>stand 76 seconds                   [facility] Adult PT Treatment/Exercise - 06/26/19 0001      Lumbar Exercises: Stretches   Active Hamstring Stretch  Right;Left;2 reps;30 seconds    Active Hamstring Stretch Limitations  seated with leg on chair    Single Knee to Chest Stretch  Right;Left;3 reps;10 seconds    Lower Trunk Rotation  5 reps    Lower Trunk Rotation Limitations  5-10 seconds    Piriformis Stretch  Right;Left;2 reps;30 seconds    Piriformis Stretch Limitations  seated with figure four technique      Lumbar Exercises: Aerobic   Nustep  L2 x 5 min UE/LE      Lumbar Exercises: Standing   Heel Raises  20 reps    Heel Raises Limitations  on Airex    Row  AROM;Strengthening;15 reps    Theraband Level (Row)  Level 3 (Green)    Shoulder Extension  AROM;Strengthening;Both;15 reps    Theraband Level (Shoulder Extension)  Level 3 (Green)    Other Standing Lumbar Exercises  hip abd SLR x 10 reps ea. bilat.      Lumbar Exercises: Seated    Sit to Stand  10 reps   2 sets of 5   Sit to Stand Limitations  from incline wedge on low mat      Lumbar Exercises: Supine   Pelvic Tilt  10 reps    Pelvic Tilt Limitations  3-5 sec hols    Glut Set  10 reps   5 sec holds   Other Supine Lumbar Exercises  hip adduction isometric with small ball 3 sec holds x 10 reps             PT Education - 06/26/19 1352    Education Details  exercises, HEP updates    Person(s) Educated  Patient    Methods  Explanation;Verbal cues;Handout;Demonstration    Comprehension  Verbalized understanding;Returned demonstration       PT Short Term Goals - 06/12/19 1655      PT SHORT TERM GOAL #1   Title  Patient to be independent in initial HEP for flexibility and conditioning    Time  3    Period  Weeks    Status  New    Target Date  07/04/19      PT SHORT TERM GOAL #2   Title  Patient to ambulate with SPC and S x 200' without LOB and pain no greater than 3/10    Time  3    Period  Weeks    Status  New    Target Date  07/04/19      PT SHORT TERM GOAL #3   Title  Patient to demonstrate sit to stand without UE support x 5 reps and no LOB    Time  3    Period  Weeks    Target Date  07/04/19        PT Long Term Goals - 06/12/19 1657      PT LONG TERM GOAL #1   Title  Independent with HEP    Time  6    Period  Weeks    Status  New    Target Date  07/25/19      PT LONG TERM GOAL #2   Title  Improve FOTO outcome measure score to 45% or less impairment    Time  6    Period  Weeks    Status  New    Target Date  07/25/19      PT LONG TERM GOAL #3   Title  Patient to demonstrate hip flexion strength at least 4-/5 for improved mobility and gait.    Time  6  Period  Weeks    Status  New    Target Date  07/25/19      PT LONG TERM GOAL #4   Title  Patient to demonstrate decreased fall risk with 5 times sit to stand in 15 seconds or less    Time  6    Period  Weeks    Status  New    Target Date  07/25/19      PT LONG TERM  GOAL #5   Title  Patient to ambulate 20 minutes without rest and pain no greater than 5/10.    Time  6    Period  Weeks    Status  New    Target Date  07/25/19            Plan - 06/26/19 1408    Clinical Impression Statement  Pt. returns for first follow up tx. after eval s/p laminectomy 2 months ago. Worked on standing LE strengthening/balance and standing + supine core and back/hip strengthening. General stiffness/soreness with exercises with fair tolerance. Continues with leg weakness with difficulty sit>stand and associated gait impairments with AD use. Pt. will need continued/ongoing T for further work on core and back + LE strengthening and to work on balance to help wean off AD use.    Personal Factors and Comorbidities  Fitness;Comorbidity 1;Comorbidity 2;Comorbidity 3+    Comorbidities  Obesity, HTN, TIA, DJD, GERD, allergies    Examination-Activity Limitations  Dressing;Lift;Locomotion Level;Sleep;Stand;Transfers    Stability/Clinical Decision Making  Evolving/Moderate complexity    Rehab Potential  Good    PT Frequency  2x / week    PT Duration  6 weeks    PT Treatment/Interventions  ADLs/Self Care Home Management;Cryotherapy;Electrical Stimulation;Iontophoresis 4mg /ml Dexamethasone;Moist Heat;Ultrasound;Traction;Stair training;DME Instruction;Gait training;Functional mobility training;Therapeutic activities;Therapeutic exercise;Balance training;Patient/family education;Manual techniques;Passive range of motion;Scar mobilization;Taping;Spinal Manipulations;Joint Manipulations    PT Next Visit Plan  continue LE/Core/back strengthening, balance/gait, stretches, cryo post-tx. next session    PT Home Exercise Plan  seated hamstring stretch, seated piriformis stretch, pelvic tilts, SKTC, hip adduction isometric, sit<>stand    Consulted and Agree with Plan of Care  Patient       Patient will benefit from skilled therapeutic intervention in order to improve the following deficits  and impairments:  Abnormal gait, Decreased activity tolerance, Decreased balance, Decreased endurance, Decreased mobility, Decreased strength, Difficulty walking, Hypomobility, Impaired flexibility, Postural dysfunction, Pain, Impaired perceived functional ability  Visit Diagnosis: Chronic bilateral low back pain with bilateral sciatica  Difficulty in walking, not elsewhere classified  Stiffness of left hip, not elsewhere classified  Stiffness of right hip, not elsewhere classified     Problem List Patient Active Problem List   Diagnosis Date Noted  . 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 Spinal stenosis of lumbar region 04/25/2019    Class: Chronic  . Status post lumbar laminectomy 04/25/2019  . Neck pain 11/12/2018  . Chronic pain of right knee 09/13/2018  . Bruising 09/13/2018  . SOB (shortness of breath) 08/30/2018  . Chronic pain of left knee 07/08/2018  . Chronic hip pain 07/08/2018  . Edema 07/08/2018  . Left foot pain 04/08/2018  . Olecranon bursitis of left elbow 04/05/2018  . Leg swelling 12/05/2017  . Need for shingles vaccine 12/05/2017  . Primary osteoarthritis of both hands 11/23/2017  . Rheumatoid factor positive 11/23/2017  . Primary osteoarthritis of both knees 11/23/2017  . Primary osteoarthritis of both feet 11/23/2017  . DDD (degenerative disc disease),  lumbar 11/23/2017  . Vaccine counseling 08/13/2017  . Polyarthralgia 08/13/2017  . Joint stiffness 08/13/2017  . Sensitive skin 01/23/2017  . Need for influenza vaccination 01/23/2017  . Proteinuria 01/23/2017  . History of gastrointestinal stromal tumor (GIST) 04/20/2016  . History of TIA (transient ischemic attack) 04/20/2016  . Screening for breast cancer 04/20/2016  . Estrogen deficiency 04/20/2016  . Chronic maxillary sinusitis 04/20/2016  . Impaired fasting blood sugar 04/20/2016  . Constipation 04/20/2016  . History of fall 04/20/2016  . Chronic  radicular lumbar pain 01/27/2016  . Encounter for health maintenance examination in adult 03/22/2015  . Paresthesia 03/22/2015  . Cognitive decline 03/22/2015  . Screening for cervical cancer 03/22/2015  . Vitamin D deficiency 03/22/2015  . History of uterine leiomyoma 03/22/2015  . Gastroesophageal reflux disease without esophagitis 03/02/2014  . Chronic nausea 03/02/2014  . Chronic abdominal pain 03/02/2014  . Rhinitis, allergic 03/02/2014  . Essential hypertension 03/02/2014  . Hyperlipidemia 03/02/2014  . Obesity with serious comorbidity 01/16/2012    Beaulah Dinning, PT, DPT 06/26/19 2:17 PM  [facility] [address] [place], [place], [address] Phone: [phone]   Fax:  [phone]  Name: Lisa Briggs MRN: [id number] Date of Birth: 16-Jan-1961

## 2019-06-27 ENCOUNTER — Ambulatory Visit: Payer: BC Managed Care – PPO | Admitting: Physical Therapy

## 2019-06-27 ENCOUNTER — Encounter: Payer: Self-pay | Admitting: Physical Therapy

## 2019-06-27 DIAGNOSIS — M5442 Lumbago with sciatica, left side: Secondary | ICD-10-CM | POA: Diagnosis not present

## 2019-06-27 DIAGNOSIS — M25652 Stiffness of left hip, not elsewhere classified: Secondary | ICD-10-CM | POA: Diagnosis not present

## 2019-06-27 DIAGNOSIS — M5441 Lumbago with sciatica, right side: Secondary | ICD-10-CM | POA: Diagnosis not present

## 2019-06-27 DIAGNOSIS — G8929 Other chronic pain: Secondary | ICD-10-CM

## 2019-06-27 DIAGNOSIS — R262 Difficulty in walking, not elsewhere classified: Secondary | ICD-10-CM

## 2019-06-27 DIAGNOSIS — M25651 Stiffness of right hip, not elsewhere classified: Secondary | ICD-10-CM | POA: Diagnosis not present

## 2019-06-27 NOTE — Therapy (Signed)
[place], [place], [address] Phone: [phone]   Fax:  [phone]  Physical Therapy Treatment  Patient Details  Name: Lisa Briggs MRN: [id number] Date of Birth: 11-20-1960 Referring Provider (PT): Louanne Skye   Encounter Date: 06/27/2019  PT End of Session - 06/27/19 1049    Visit Number  3    Number of Visits  12    Date for PT Re-Evaluation  07/25/19    Authorization Type  BCBS    PT Start Time  1015    PT Stop Time  1108    PT Time Calculation (min)  53 min    Activity Tolerance  Patient limited by pain    Behavior During Therapy  [facility] for tasks assessed/performed       Past Medical History:  Diagnosis Date  . Allergy   . Anemia    iron therapy for years as of 10/12; normal Hgb 08/2013  . Anxiety   . Arthritis   . Chest pain 04/05/2011   cardiac eval, normal treadmill stress test, Dr. Tollie Eth  . Chronic back pain   . Constipation   . Farsightedness    wears glasses, Eye care center  . 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 [name], [facility]  . GERD (gastroesophageal reflux disease)   . History of uterine fibroid   . Hyperlipidemia   . Hypertension   . Paresthesia 09/2014   initially thought to be TIA, neurology consult in 12/2014 with other non TIA considerations.    . Polyarthralgia    normal rheumatoid screen 01/2012  . TIA (transient ischemic attack) 2015/16    Past Surgical History:  Procedure Laterality Date  . COLONOSCOPY  01/2014   diverticulosis, othwerise normal - Dr. Owens Loffler  . ESOPHAGOGASTRODUODENOSCOPY  2013   Dr. Benson Norway, gastritis  . ESOPHAGOGASTRODUODENOSCOPY  [id number]  . EUS N/A 04/16/2014   Procedure: UPPER ENDOSCOPIC ULTRASOUND (EUS) LINEAR;  Surgeon: Milus Banister, MD;  [facility]: [facility];  Service: Endoscopy;  Laterality: N/A;  . gall stone surgery    . KNEE ARTHROSCOPY Left   . LAPAROSCOPIC GASTRIC RESECTION  N/A 06/09/2014   Procedure: LAPAROSCOPIC GASTRIC MASS RESECTION;  Surgeon: [name], MD;  [facility]: [facility];  Service: General;  Laterality: N/A;  . LIPOMA EXCISION     forehead  . LUMBAR LAMINECTOMY N/A 04/25/2019   Procedure: LEFT L2-3 MICRODISCECTOMY, BILATERAL L5-S1 PARTIAL HEMILAMINECTOMY;  Surgeon: Jessy Oto, MD;  Location: [place];  Service: Orthopedics;  Laterality: N/A;  . UPPER GASTROINTESTINAL ENDOSCOPY    . UTERINE FIBROID SURGERY      There were no vitals filed for this visit.  Subjective Assessment - 06/27/19 1025    Subjective  No pain pre-tx. Mild soreness from exercises yesterday otherwise no new complaints/concerns today. Pt. using rollator today rather than [facility]                       [facility] Adult PT Treatment/Exercise - 06/27/19 0001      Lumbar Exercises: Stretches   Passive Hamstring Stretch  Left;3 reps;30 seconds    Single Knee to Chest Stretch Limitations  x 10 ea. bilat. with leg on 55 cm P-ball    Lower Trunk Rotation  5 reps    Lower Trunk Rotation Limitations  5-10 seconds    Piriformis Stretch  Left;3 reps;30 seconds    Other Lumbar Stretch Exercise  Left dural nerve floss  10      Lumbar Exercises: Aerobic   Nustep  L4 x 6 min UE/LE      Lumbar Exercises: Standing   Functional Squats Limitations  partial squat at counter x 10 reps   c/o leg pain on left   Row  AROM;Strengthening;15 reps    Theraband Level (Row)  Level 3 (Green)    Shoulder Extension  AROM;Strengthening;Both;15 reps    Theraband Level (Shoulder Extension)  Level 3 (Green)      Lumbar Exercises: Supine   Pelvic Tilt  15 reps    Pelvic Tilt Limitations  3-5 sec holds    Clam  15 reps    Clam Limitations  red Theraband    Bent Knee Raise  10 reps    Bridge Limitations  partial bridge with legs over reversed incline wedge x 10 reps   glut set to partial bridge lift off     Knee/Hip Exercises: Standing   Forward Step Up  Right;Left;15 reps;Hand Hold: 2;Step  Height: 4"      Modalities   Modalities  Cryotherapy      Cryotherapy   Number Minutes Cryotherapy  10 Minutes   supine with legs propped on cushion   Cryotherapy Location  Lumbar Spine    Type of Cryotherapy  Ice pack             PT Education - 06/27/19 1048    Education Details  exercises    Person(s) Educated  Patient    Methods  Explanation;Demonstration;Verbal cues    Comprehension  Verbalized understanding;Returned demonstration       PT Short Term Goals - 06/12/19 1655      PT SHORT TERM GOAL #1   Title  Patient to be independent in initial HEP for flexibility and conditioning    Time  3    Period  Weeks    Status  New    Target Date  07/04/19      PT SHORT TERM GOAL #2   Title  Patient to ambulate with SPC and S x 200' without LOB and pain no greater than 3/10    Time  3    Period  Weeks    Status  New    Target Date  07/04/19      PT SHORT TERM GOAL #3   Title  Patient to demonstrate sit to stand without UE support x 5 reps and no LOB    Time  3    Period  Weeks    Target Date  07/04/19        PT Long Term Goals - 06/12/19 1657      PT LONG TERM GOAL #1   Title  Independent with HEP    Time  6    Period  Weeks    Status  New    Target Date  07/25/19      PT LONG TERM GOAL #2   Title  Improve FOTO outcome measure score to 45% or less impairment    Time  6    Period  Weeks    Status  New    Target Date  07/25/19      PT LONG TERM GOAL #3   Title  Patient to demonstrate hip flexion strength at least 4-/5 for improved mobility and gait.    Time  6    Period  Weeks    Status  New    Target Date  07/25/19  PT LONG TERM GOAL #4   Title  Patient to demonstrate decreased fall risk with 5 times sit to stand in 15 seconds or less    Time  6    Period  Weeks    Status  New    Target Date  07/25/19      PT LONG TERM GOAL #5   Title  Patient to ambulate 20 minutes without rest and pain no greater than 5/10.    Time  6    Period   Weeks    Status  New    Target Date  07/25/19            Plan - 06/27/19 1049    Clinical Impression Statement  Continued exercise progression with incorporation standing strengthening for LE with addition partial squats and step ups. Challenged with hip pain with squats but did well with step ups. Also added nerve glides for LLE (supine knee ext with ankle PF) given residual left LE radicular symptoms to help address neural tension. Fair tx. tolerance due to pain and would benefit from continued PT to address moiblity limitations.    Personal Factors and Comorbidities  Fitness;Comorbidity 1;Comorbidity 2;Comorbidity 3+    Comorbidities  Obesity, HTN, TIA, DJD, GERD, allergies    Examination-Activity Limitations  Dressing;Lift;Locomotion Level;Sleep;Stand;Transfers    Examination-Participation Restrictions  Community Activity;Shop;Meal Prep;Cleaning    Stability/Clinical Decision Making  Evolving/Moderate complexity    Clinical Decision Making  Moderate    Rehab Potential  Good    PT Frequency  2x / week    PT Duration  6 weeks    PT Treatment/Interventions  ADLs/Self Care Home Management;Cryotherapy;Electrical Stimulation;Iontophoresis 4mg /ml Dexamethasone;Moist Heat;Ultrasound;Traction;Stair training;DME Instruction;Gait training;Functional mobility training;Therapeutic activities;Therapeutic exercise;Balance training;Patient/family education;Manual techniques;Passive range of motion;Scar mobilization;Taping;Spinal Manipulations;Joint Manipulations    PT Next Visit Plan  continue LE/Core/back strengthening, balance/gait, stretches, cryo post-tx.    PT Home Exercise Plan  seated hamstring stretch, seated piriformis stretch, pelvic tilts, SKTC, hip adduction isometric, sit<>stand    Consulted and Agree with Plan of Care  Patient       Patient will benefit from skilled therapeutic intervention in order to improve the following deficits and impairments:  Abnormal gait, Decreased activity  tolerance, Decreased balance, Decreased endurance, Decreased mobility, Decreased strength, Difficulty walking, Hypomobility, Impaired flexibility, Postural dysfunction, Pain, Impaired perceived functional ability  Visit Diagnosis: Chronic bilateral low back pain with bilateral sciatica  Difficulty in walking, not elsewhere classified  Stiffness of left hip, not elsewhere classified  Stiffness of right hip, not elsewhere classified     Problem List Patient Active Problem List   Diagnosis Date Noted  . 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 Spinal stenosis of lumbar region 04/25/2019    Class: Chronic  . Status post lumbar laminectomy 04/25/2019  . Neck pain 11/12/2018  . Chronic pain of right knee 09/13/2018  . Bruising 09/13/2018  . SOB (shortness of breath) 08/30/2018  . Chronic pain of left knee 07/08/2018  . Chronic hip pain 07/08/2018  . Edema 07/08/2018  . Left foot pain 04/08/2018  . Olecranon bursitis of left elbow 04/05/2018  . Leg swelling 12/05/2017  . Need for shingles vaccine 12/05/2017  . Primary osteoarthritis of both hands 11/23/2017  . Rheumatoid factor positive 11/23/2017  . Primary osteoarthritis of both knees 11/23/2017  . Primary osteoarthritis of both feet 11/23/2017  . DDD (degenerative disc disease), lumbar 11/23/2017  . Vaccine counseling 08/13/2017  . Polyarthralgia 08/13/2017  .  Joint stiffness 08/13/2017  . Sensitive skin 01/23/2017  . Need for influenza vaccination 01/23/2017  . Proteinuria 01/23/2017  . History of gastrointestinal stromal tumor (GIST) 04/20/2016  . History of TIA (transient ischemic attack) 04/20/2016  . Screening for breast cancer 04/20/2016  . Estrogen deficiency 04/20/2016  . Chronic maxillary sinusitis 04/20/2016  . Impaired fasting blood sugar 04/20/2016  . Constipation 04/20/2016  . History of fall 04/20/2016  . Chronic radicular lumbar pain 01/27/2016  . Encounter for health  maintenance examination in adult 03/22/2015  . Paresthesia 03/22/2015  . Cognitive decline 03/22/2015  . Screening for cervical cancer 03/22/2015  . Vitamin D deficiency 03/22/2015  . History of uterine leiomyoma 03/22/2015  . Gastroesophageal reflux disease without esophagitis 03/02/2014  . Chronic nausea 03/02/2014  . Chronic abdominal pain 03/02/2014  . Rhinitis, allergic 03/02/2014  . Essential hypertension 03/02/2014  . Hyperlipidemia 03/02/2014  . Obesity with serious comorbidity 01/16/2012   Beaulah Dinning, PT, DPT 06/27/19 10:59 AM  [facility] [address] [place], [place], [address] Phone: [phone]   Fax:  [phone]  Name: Lisa Briggs MRN: [id number] Date of Birth: [birth year]

## 2019-06-29 DIAGNOSIS — J3089 Other allergic rhinitis: Secondary | ICD-10-CM | POA: Diagnosis not present

## 2019-06-29 DIAGNOSIS — H109 Unspecified conjunctivitis: Secondary | ICD-10-CM | POA: Diagnosis not present

## 2019-06-29 DIAGNOSIS — J069 Acute upper respiratory infection, unspecified: Secondary | ICD-10-CM | POA: Diagnosis not present

## 2019-06-30 NOTE — Progress Notes (Signed)
Office Visit Note  [name]             Date of Birth: 1960/12/27           MRN: [id number]             PCP: Carlena Hurl, PA-C Referring: Carlena Hurl, PA-C Visit Date: 07/07/2019 Occupation: @GUAROCC @  Subjective:  Left knee joint pain   History of Present Illness: Lisa Briggs is a 59 y.o. female with history of seropositive rheumatoid arthritis and osteoarthritis.  She is taking plaquenil 200 mg 1 tablet by mouth twice daily. She denies any recent rheumatoid arthritis flares.  She states she continues to have chronic left knee joint pain and notices intermittent joint swelling.  She takes meloxicam 15 mg 1 tablet daily for pain relief.  She has ongoing discomfort due to trochanteric bursitis bilaterally. She has interrupted sleep at night when laying on her sides.  She performs stretching exercises on a daily basis and applies ice as needed for pain relief.  She continues to see Dr. Louanne Skye s/p lumbar laminectomy on 04/25/18.  She is currently going to physical therapy.  She takes flexeril and robaxin as needed for muscle spasms.  She uses a rollator walker to assist with ambulation.  She does not have an upcoming PLQ eye exam scheduled yet. She would like a referral to a new ophthalmologist.     Activities of Daily Living:  Patient reports joint stiffness all day  Patient Reports nocturnal pain.  Difficulty dressing/grooming: Reports Difficulty climbing stairs: Reports Difficulty getting out of chair: Reports Difficulty using hands for taps, buttons, cutlery, and/or writing: Reports  Review of Systems  Constitutional: Positive for fatigue.  HENT: Positive for mouth dryness. Negative for mouth sores and nose dryness.   Eyes: Negative for pain, visual disturbance and dryness.  Respiratory: Negative for cough, hemoptysis, shortness of breath and difficulty breathing.   Cardiovascular: Positive for swelling in legs/feet. Negative for chest pain, palpitations and  hypertension.  Gastrointestinal: Negative for blood in stool, constipation and diarrhea.  Endocrine: Negative for increased urination.  Genitourinary: Negative for painful urination.  Musculoskeletal: Positive for arthralgias, gait problem, joint pain, joint swelling, muscle weakness, morning stiffness and muscle tenderness. Negative for myalgias and myalgias.  Skin: Negative for color change, pallor, rash, hair loss, nodules/bumps, skin tightness, ulcers and sensitivity to sunlight.  Allergic/Immunologic: Negative for susceptible to infections.  Neurological: Negative for dizziness, numbness and headaches.  Hematological: Negative for swollen glands.  Psychiatric/Behavioral: Positive for sleep disturbance. Negative for depressed mood. The patient is not nervous/anxious.     PMFS History:  Patient Active Problem List   Diagnosis Date Noted  . 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 Spinal stenosis of lumbar region 04/25/2019    Class: Chronic  . Status post lumbar laminectomy 04/25/2019  . Neck pain 11/12/2018  . Chronic pain of right knee 09/13/2018  . Bruising 09/13/2018  . SOB (shortness of breath) 08/30/2018  . Chronic pain of left knee 07/08/2018  . Chronic hip pain 07/08/2018  . Edema 07/08/2018  . Left foot pain 04/08/2018  . Olecranon bursitis of left elbow 04/05/2018  . Leg swelling 12/05/2017  . Need for shingles vaccine 12/05/2017  . Primary osteoarthritis of both hands 11/23/2017  . Rheumatoid factor positive 11/23/2017  . Primary osteoarthritis of both knees 11/23/2017  . Primary osteoarthritis of both feet 11/23/2017  . DDD (degenerative disc disease), lumbar 11/23/2017  .  Vaccine counseling 08/13/2017  . Polyarthralgia 08/13/2017  . Joint stiffness 08/13/2017  . Sensitive skin 01/23/2017  . Need for influenza vaccination 01/23/2017  . Proteinuria 01/23/2017  . History of gastrointestinal stromal tumor (GIST) 04/20/2016  .  History of TIA (transient ischemic attack) 04/20/2016  . Screening for breast cancer 04/20/2016  . Estrogen deficiency 04/20/2016  . Chronic maxillary sinusitis 04/20/2016  . Impaired fasting blood sugar 04/20/2016  . Constipation 04/20/2016  . History of fall 04/20/2016  . Chronic radicular lumbar pain 01/27/2016  . Encounter for health maintenance examination in adult 03/22/2015  . Paresthesia 03/22/2015  . Cognitive decline 03/22/2015  . Screening for cervical cancer 03/22/2015  . Vitamin D deficiency 03/22/2015  . History of uterine leiomyoma 03/22/2015  . Gastroesophageal reflux disease without esophagitis 03/02/2014  . Chronic nausea 03/02/2014  . Chronic abdominal pain 03/02/2014  . Rhinitis, allergic 03/02/2014  . Essential hypertension 03/02/2014  . Hyperlipidemia 03/02/2014  . Obesity with serious comorbidity 01/16/2012    Past Medical History:  Diagnosis Date  . Allergy   . Anemia    iron therapy for years as of 10/12; normal Hgb 08/2013  . Anxiety   . Arthritis   . Chest pain 04/05/2011   cardiac eval, normal treadmill stress test, Dr. Tollie Eth  . Chronic back pain   . Constipation   . Farsightedness    wears glasses, Eye care center  . 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 [name], [facility]  . GERD (gastroesophageal reflux disease)   . History of uterine fibroid   . Hyperlipidemia   . Hypertension   . Paresthesia 09/2014   initially thought to be TIA, neurology consult in 12/2014 with other non TIA considerations.    . Polyarthralgia    normal rheumatoid screen 01/2012  . TIA (transient ischemic attack) 2015/16    Family History  Problem Relation Age of Onset  . Hypertension Mother   . Arthritis Mother   . GER disease Mother   . Glaucoma Mother   . Breast cancer Mother   . Stroke Father   . Breast cancer Sister        breast cancer dx late 38s  . Hypertension Sister   . Colon cancer Brother 4  .  Diabetes Brother   . Diabetes Maternal Aunt   . Heart disease Maternal Aunt   . Heart disease Maternal Grandmother   . Heart disease Maternal Grandfather   . Heart disease Maternal Uncle   . Stroke Maternal Uncle   . Leukemia Sister   . Hypertension Sister   . Healthy Son   . Colon polyps Neg Hx   . Esophageal cancer Neg Hx   . Stomach cancer Neg Hx   . Rectal cancer Neg Hx    Past Surgical History:  Procedure Laterality Date  . COLONOSCOPY  01/2014   diverticulosis, othwerise normal - Dr. Owens Loffler  . ESOPHAGOGASTRODUODENOSCOPY  2013   Dr. Benson Norway, gastritis  . ESOPHAGOGASTRODUODENOSCOPY  [id number]  . EUS N/A 04/16/2014   Procedure: UPPER ENDOSCOPIC ULTRASOUND (EUS) LINEAR;  Surgeon: Milus Banister, MD;  Location: [facility];  Service: Endoscopy;  Laterality: N/A;  . gall stone surgery    . KNEE ARTHROSCOPY Left   . LAPAROSCOPIC GASTRIC RESECTION N/A 06/09/2014   Procedure: LAPAROSCOPIC GASTRIC MASS RESECTION;  Surgeon: [name], MD;  Location: [facility];  Service: General;  Laterality: N/A;  . LIPOMA EXCISION     forehead  . LUMBAR  LAMINECTOMY N/A 04/25/2019   Procedure: LEFT L2-3 MICRODISCECTOMY, BILATERAL L5-S1 PARTIAL HEMILAMINECTOMY;  Surgeon: Jessy Oto, MD;  Location: [place];  Service: Orthopedics;  Laterality: N/A;  . UPPER GASTROINTESTINAL ENDOSCOPY    . UTERINE FIBROID SURGERY     Social History   Social History Narrative   Married, has 1 son in [place] and some grandchildren.  Glass blower/designer.  Active on job.  Does stretching and exercises daily as per physical therapy.  Works 12 hours daily.     Immunization History  Administered Date(s) Administered  . Influenza,inj,Quad PF,6+ Mos 11/24/2013, 01/18/2015, 01/23/2017, 12/05/2017, 12/17/2018  . PFIZER SARS-COV-2 Vaccination 06/16/2019  . Pneumococcal Polysaccharide-23 05/21/2015  . Tdap 01/17/2011  . Zoster Recombinat (Shingrix) 10/05/2017, 12/07/2017     Objective: Vital Signs: BP 118/76 (BP  Location: Right Arm, Patient Position: Sitting, Cuff Size: Normal)   Pulse 81   Resp 16   Ht 5\' 9"  (1.753 m)   Wt 254 lb 12.8 oz (115.6 kg)   LMP 02/23/2015 (LMP Unknown)   BMI 37.63 kg/m    Physical Exam Vitals and nursing note reviewed.  Constitutional:      Appearance: She is well-developed.  HENT:     Head: Normocephalic and atraumatic.  Eyes:     Conjunctiva/sclera: Conjunctivae normal.  Pulmonary:     Effort: Pulmonary effort is normal.  Abdominal:     General: Bowel sounds are normal.     Palpations: Abdomen is soft.  Musculoskeletal:     Cervical back: Normal range of motion.  Lymphadenopathy:     Cervical: No cervical adenopathy.  Skin:    General: Skin is warm and dry.     Capillary Refill: Capillary refill takes less than 2 seconds.  Neurological:     Mental Status: She is alert and oriented to person, place, and time.  Psychiatric:        Behavior: Behavior normal.      Musculoskeletal Exam: Generalized hyperalgesia. C-spine, thoracic spine, and lumbar spine good ROM.  Shoulder joints, elbow joints, wrist joints, MCPs, PIPs, and DIPs good ROM with no synovitis.  Complete fist formation bilaterally. PIP and DIP thickening consistent with osteoarthritis of both hands.  Hip joints difficult to assess while seated on rollator walker.  Knee joints good ROM bilaterally. Mild warmth of left knee but no effusion noted.  Ankle joints good ROM with no tenderness or synovitis.   CDAI Exam: CDAI Score: 1.1  Patient Global: 8 mm; Provider Global: 3 mm Swollen: 0 ; Tender: 0  Joint Exam 07/07/2019   No joint exam has been documented for this visit   There is currently no information documented on the homunculus. Go to the Rheumatology activity and complete the homunculus joint exam.  Investigation: No additional findings.  Imaging: MM 3D SCREEN BREAST BILATERAL  Result Date: 06/27/2019 CLINICAL DATA:  Screening. EXAM: DIGITAL SCREENING BILATERAL MAMMOGRAM WITH TOMO  AND CAD COMPARISON:  Previous exam(s). ACR Breast Density Category b: There are scattered areas of fibroglandular density. FINDINGS: There are no findings suspicious for malignancy. Images were processed with CAD. IMPRESSION: No mammographic evidence of malignancy. A result letter of this screening mammogram will be mailed directly to the patient. RECOMMENDATION: Screening mammogram in one year. (Code:SM-B-01Y) BI-RADS CATEGORY  1: Negative. Electronically Signed   By: Evangeline Dakin M.D.   On: 06/27/2019 15:36    Recent Labs: Lab Results  Component Value Date   WBC 11.7 (H) 04/26/2019   HGB 11.3 (L) 04/26/2019  PLT 304 04/26/2019   NA 138 04/26/2019   K 3.7 04/26/2019   CL 100 04/26/2019   CO2 27 04/26/2019   GLUCOSE 100 (H) 04/26/2019   BUN 11 04/26/2019   CREATININE 0.73 04/26/2019   BILITOT 0.4 04/15/2019   ALKPHOS 114 04/15/2019   AST 18 04/15/2019   ALT 17 04/15/2019   PROT 7.3 04/15/2019   ALBUMIN 3.7 04/15/2019   CALCIUM 8.9 04/26/2019   GFRAA >60 04/26/2019    Speciality Comments: PLQ Eye Exam: 05/29/18 @ [facility] Follow up in 1 year  Procedures:  No procedures performed Allergies: Kiwi extract, Mucinex dm [dm-guaifenesin er], and Peach [prunus persica]   Assessment / Plan:     Visit Diagnoses: Rheumatoid arthritis involving multiple sites with positive rheumatoid factor ([place]) - +RF, +CCP: She has no synovitis on exam. She has not had any rheumatoid arthritis flares recently.  She is clinically doing well on Plaquenil 200 mg 1 tablet by mouth twice daily.  She experiences intermittent pain and stiffness in both hands and both knee joints.  No inflammation was noted on exam.  She will continue taking PLQ as prescribed. She was advised to notify us if she develops increased joint pain or joint swelling. She will follow up in 5 months. - Plan: Ambulatory referral to Ophthalmology  High risk medication use - Plaquenil 200 mg 1 tablet by mouth twice daily.   CBC and BMP were ordered on 04/26/2019.  She will be due to update lab work in June and every 5 months to monitor for drug toxicity.  She has upcoming lab work with another provider.  Her most recent Plaquenil eye exam was normal on 05/29/2018.  She is due to update her Plaquenil eye exam.  She requested a referral to a new ophthalmologist.  We will send her to Dr. Zenia Resides office.- Plan: Ambulatory referral to Ophthalmology  Primary osteoarthritis of both hands: She has PIP and DIP thickening consistent with osteoarthritis of both hands.  She has no tenderness or synovitis on exam.  She has complete fist formation bilaterally.  Joint protection and muscle strengthening were discussed.  Primary osteoarthritis of both knees: X-rays of both knees were obtained on 10/24/2017 which revealed moderate osteoarthritis and severe chondromalacia patella bilaterally.  She has good ROM of both knee joints on exam.  She has intermittent pain and inflammation in the left knee joint.  She has mild warmth but no effusion on exam today.  She takes meloxicam 15 mg 1 tablet daily for pain relief.  She uses a rollator walker to assist with ambulation.    Primary osteoarthritis of both feet: She is not having any discomfort in her feet at this time.  She wears proper fitting shoes.  Trochanteric bursitis of both hips: She has tenderness over bilateral trochanteric bursa. Her discomfort is exacerbated by laying on her sides at night. She declined cortisone injections today.  She was given a handout of exercises to perform.   DDD (degenerative disc disease), lumbar: S/p lumbar laminectomy-Dr. Nitka-04/25/19-She is doing well and has not had any complications.  She continues to go to physical therapy and is using a rollator walker to assist with ambulation.  She will be following up with Dr. Louanne Skye on 07/10/19.   Septic olecranon bursitis of left elbow: Resolved.  No tenderness or inflammation noted on exam.   Other medical  conditions are listed as follows:   Gastroesophageal reflux disease without esophagitis  Essential hypertension  Vitamin D deficiency  History of TIA (transient ischemic attack)  Orders: Orders Placed This Encounter  Procedures  . Ambulatory referral to Ophthalmology   No orders of the defined types were placed in this encounter.    Follow-Up Instructions: Return in about 5 months (around 12/07/2019) for Rheumatoid arthritis.   Ofilia Neas, PA-C  Note - This record has been created using Dragon software.  Chart creation errors have been sought, but may not always  have been located. Such creation errors do not reflect on  the standard of medical care.

## 2019-07-01 ENCOUNTER — Ambulatory Visit: Payer: BC Managed Care – PPO

## 2019-07-01 ENCOUNTER — Ambulatory Visit: Payer: BC Managed Care – PPO | Admitting: Physical Therapy

## 2019-07-01 ENCOUNTER — Encounter: Payer: Self-pay | Admitting: Physical Therapy

## 2019-07-01 ENCOUNTER — Other Ambulatory Visit: Payer: Self-pay

## 2019-07-01 DIAGNOSIS — R262 Difficulty in walking, not elsewhere classified: Secondary | ICD-10-CM

## 2019-07-01 DIAGNOSIS — M25651 Stiffness of right hip, not elsewhere classified: Secondary | ICD-10-CM

## 2019-07-01 DIAGNOSIS — M5441 Lumbago with sciatica, right side: Secondary | ICD-10-CM

## 2019-07-01 DIAGNOSIS — G8929 Other chronic pain: Secondary | ICD-10-CM | POA: Diagnosis not present

## 2019-07-01 DIAGNOSIS — M25652 Stiffness of left hip, not elsewhere classified: Secondary | ICD-10-CM | POA: Diagnosis not present

## 2019-07-01 DIAGNOSIS — M5442 Lumbago with sciatica, left side: Secondary | ICD-10-CM | POA: Diagnosis not present

## 2019-07-02 ENCOUNTER — Encounter: Payer: Self-pay | Admitting: Cardiology

## 2019-07-02 ENCOUNTER — Encounter: Payer: Self-pay | Admitting: Physical Therapy

## 2019-07-02 ENCOUNTER — Ambulatory Visit: Payer: BC Managed Care – PPO | Admitting: Cardiology

## 2019-07-02 ENCOUNTER — Telehealth: Payer: Self-pay

## 2019-07-02 VITALS — BP 138/81 | HR 96 | Temp 98.0°F | Resp 16 | Ht 69.0 in | Wt 252.0 lb

## 2019-07-02 DIAGNOSIS — Z6837 Body mass index (BMI) 37.0-37.9, adult: Secondary | ICD-10-CM

## 2019-07-02 DIAGNOSIS — I429 Cardiomyopathy, unspecified: Secondary | ICD-10-CM

## 2019-07-02 DIAGNOSIS — E782 Mixed hyperlipidemia: Secondary | ICD-10-CM

## 2019-07-02 DIAGNOSIS — R9431 Abnormal electrocardiogram [ECG] [EKG]: Secondary | ICD-10-CM | POA: Diagnosis not present

## 2019-07-02 DIAGNOSIS — I10 Essential (primary) hypertension: Secondary | ICD-10-CM

## 2019-07-02 DIAGNOSIS — Z8673 Personal history of transient ischemic attack (TIA), and cerebral infarction without residual deficits: Secondary | ICD-10-CM

## 2019-07-02 MED ORDER — CARVEDILOL 6.25 MG PO TABS: 6.2500 mg | ORAL_TABLET | Freq: Two times a day (BID) | ORAL | 3 refills | Status: DC

## 2019-07-02 NOTE — Telephone Encounter (Signed)
Pt. Called said she was here for blood work. I told her she was not on the schedule today for blood work and I would have to run that by her provider before I could get her scheduled. She said her Cardiologists said she needed some blood work done and she could get it done here. I told her someone would call her back to get her scheduled once it was ok by you and we would need an order from her Cardiologist.

## 2019-07-02 NOTE — Therapy (Signed)
[place], [place], [address] Phone: [phone]   Fax:  [phone]  Physical Therapy Treatment  Patient Details  Name: Lisa Briggs MRN: [id number] Date of Birth: 05/20/1960 Referring Provider (PT): Louanne Skye   Encounter Date: 07/01/2019  PT End of Session - 07/01/19 1706    Visit Number  4    Number of Visits  12    Date for PT Re-Evaluation  07/25/19    Authorization Type  [employer]    PT Start Time  1620    PT Stop Time  1700    PT Time Calculation (min)  40 min    Activity Tolerance  Patient limited by pain    Behavior During Therapy  [facility] for tasks assessed/performed       Past Medical History:  Diagnosis Date  . Allergy   . Anemia    iron therapy for years as of 10/12; normal Hgb 08/2013  . Anxiety   . Arthritis   . Chest pain 04/05/2011   cardiac eval, normal treadmill stress test, Dr. Tollie Eth  . Chronic back pain   . Constipation   . Farsightedness    wears glasses, Eye care center  . 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 [name], [facility]  . GERD (gastroesophageal reflux disease)   . History of uterine fibroid   . Hyperlipidemia   . Hypertension   . Paresthesia 09/2014   initially thought to be TIA, neurology consult in 12/2014 with other non TIA considerations.    . Polyarthralgia    normal rheumatoid screen 01/2012  . TIA (transient ischemic attack) 2015/16    Past Surgical History:  Procedure Laterality Date  . COLONOSCOPY  01/2014   diverticulosis, othwerise normal - Dr. Owens Loffler  . ESOPHAGOGASTRODUODENOSCOPY  2013   Dr. Benson Norway, gastritis  . ESOPHAGOGASTRODUODENOSCOPY  [id number]  . EUS N/A 04/16/2014   Procedure: UPPER ENDOSCOPIC ULTRASOUND (EUS) LINEAR;  Surgeon: Milus Banister, MD;  [facility];  Service: Endoscopy;  Laterality: N/A;  . gall stone surgery    . KNEE ARTHROSCOPY Left   . LAPAROSCOPIC GASTRIC RESECTION  N/A 06/09/2014   Procedure: LAPAROSCOPIC GASTRIC MASS RESECTION;  Surgeon: [name], MD;  [facility];  Service: General;  Laterality: N/A;  . LIPOMA EXCISION     forehead  . LUMBAR LAMINECTOMY N/A 04/25/2019   Procedure: LEFT L2-3 MICRODISCECTOMY, BILATERAL L5-S1 PARTIAL HEMILAMINECTOMY;  Surgeon: Jessy Oto, MD;  Location: [place];  Service: Orthopedics;  Laterality: N/A;  . UPPER GASTROINTESTINAL ENDOSCOPY    . UTERINE FIBROID SURGERY      There were no vitals filed for this visit.  Subjective Assessment - 07/01/19 1641    Subjective  Patient reports no pain and no falls recently. She had no significant increase in pain after exercises the last time but does have some pain at night.    Limitations  Walking    How long can you sit comfortably?  maybe 45 minutes    How long can you walk comfortably?  3-5 minutes before taking a break and walked 3 times    Patient Stated Goals  Improve walking, get back to work    Currently in Pain?  No/denies                               PT Education - 07/02/19 0831    Education  Details  HEP and symptom mangement    Person(s) Educated  Patient    Methods  Explanation;Demonstration;Tactile cues;Verbal cues    Comprehension  Verbalized understanding;Returned demonstration;Verbal cues required;Tactile cues required       PT Short Term Goals - 07/02/19 0832      PT SHORT TERM GOAL #1   Title  Patient to be independent in initial HEP for flexibility and conditioning    Time  3    Period  Weeks    Status  New    Target Date  07/04/19      PT SHORT TERM GOAL #2   Title  Patient to ambulate with SPC and S x 200' without LOB and pain no greater than 3/10    Time  3    Period  Weeks    Status  On-going    Target Date  07/04/19      PT SHORT TERM GOAL #3   Title  Patient to demonstrate sit to stand without UE support x 5 reps and no LOB    Time  3    Period  Weeks    Status  On-going    Target Date   07/04/19        PT Long Term Goals - 06/12/19 1657      PT LONG TERM GOAL #1   Title  Independent with HEP    Time  6    Period  Weeks    Status  New    Target Date  07/25/19      PT LONG TERM GOAL #2   Title  Improve FOTO outcome measure score to 45% or less impairment    Time  6    Period  Weeks    Status  New    Target Date  07/25/19      PT LONG TERM GOAL #3   Title  Patient to demonstrate hip flexion strength at least 4-/5 for improved mobility and gait.    Time  6    Period  Weeks    Status  New    Target Date  07/25/19      PT LONG TERM GOAL #4   Title  Patient to demonstrate decreased fall risk with 5 times sit to stand in 15 seconds or less    Time  6    Period  Weeks    Status  New    Target Date  07/25/19      PT LONG TERM GOAL #5   Title  Patient to ambulate 20 minutes without rest and pain no greater than 5/10.    Time  6    Period  Weeks    Status  New    Target Date  07/25/19            Plan - 07/01/19 1709    Clinical Impression Statement  Patient had minor pain with hip abdcution and moini squats but otherwise tolerated treatment well. She was encouraged to continue her the-rex at home. Treatment was kept consistent. Therapy ewill progress the patient as tolerated.    Personal Factors and Comorbidities  Fitness;Comorbidity 1;Comorbidity 2;Comorbidity 3+    Comorbidities  Obesity, HTN, TIA, DJD, GERD, allergies    Examination-Activity Limitations  Dressing;Lift;Locomotion Level;Sleep;Stand;Transfers    Examination-Participation Restrictions  Community Activity;Shop;Meal Prep;Cleaning    PT Frequency  2x / week    PT Duration  6 weeks    PT Treatment/Interventions  ADLs/Self Care Home Management;Cryotherapy;Electrical Stimulation;Iontophoresis 4mg /ml Dexamethasone;Moist  Heat;Ultrasound;Traction;Stair training;DME Instruction;Gait training;Functional mobility training;Therapeutic activities;Therapeutic exercise;Balance training;Patient/family  education;Manual techniques;Passive range of motion;Scar mobilization;Taping;Spinal Manipulations;Joint Manipulations    PT Next Visit Plan  continue LE/Core/back strengthening, balance/gait, stretches, cryo post-tx.    PT Home Exercise Plan  seated hamstring stretch, seated piriformis stretch, pelvic tilts, SKTC, hip adduction isometric, sit<>stand    Consulted and Agree with Plan of Care  Patient       Patient will benefit from skilled therapeutic intervention in order to improve the following deficits and impairments:  Abnormal gait, Decreased activity tolerance, Decreased balance, Decreased endurance, Decreased mobility, Decreased strength, Difficulty walking, Hypomobility, Impaired flexibility, Postural dysfunction, Pain, Impaired perceived functional ability  Visit Diagnosis: Chronic bilateral low back pain with bilateral sciatica  Difficulty in walking, not elsewhere classified  Stiffness of left hip, not elsewhere classified  Stiffness of right hip, not elsewhere classified     Problem List Patient Active Problem List   Diagnosis Date Noted  . 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 Spinal stenosis of lumbar region 04/25/2019    Class: Chronic  . Status post lumbar laminectomy 04/25/2019  . Neck pain 11/12/2018  . Chronic pain of right knee 09/13/2018  . Bruising 09/13/2018  . SOB (shortness of breath) 08/30/2018  . Chronic pain of left knee 07/08/2018  . Chronic hip pain 07/08/2018  . Edema 07/08/2018  . Left foot pain 04/08/2018  . Olecranon bursitis of left elbow 04/05/2018  . Leg swelling 12/05/2017  . Need for shingles vaccine 12/05/2017  . Primary osteoarthritis of both hands 11/23/2017  . Rheumatoid factor positive 11/23/2017  . Primary osteoarthritis of both knees 11/23/2017  . Primary osteoarthritis of both feet 11/23/2017  . DDD (degenerative disc disease), lumbar 11/23/2017  . Vaccine counseling 08/13/2017  .  Polyarthralgia 08/13/2017  . Joint stiffness 08/13/2017  . Sensitive skin 01/23/2017  . Need for influenza vaccination 01/23/2017  . Proteinuria 01/23/2017  . History of gastrointestinal stromal tumor (GIST) 04/20/2016  . History of TIA (transient ischemic attack) 04/20/2016  . Screening for breast cancer 04/20/2016  . Estrogen deficiency 04/20/2016  . Chronic maxillary sinusitis 04/20/2016  . Impaired fasting blood sugar 04/20/2016  . Constipation 04/20/2016  . History of fall 04/20/2016  . Chronic radicular lumbar pain 01/27/2016  . Encounter for health maintenance examination in adult 03/22/2015  . Paresthesia 03/22/2015  . Cognitive decline 03/22/2015  . Screening for cervical cancer 03/22/2015  . Vitamin D deficiency 03/22/2015  . History of uterine leiomyoma 03/22/2015  . Gastroesophageal reflux disease without esophagitis 03/02/2014  . Chronic nausea 03/02/2014  . Chronic abdominal pain 03/02/2014  . Rhinitis, allergic 03/02/2014  . Essential hypertension 03/02/2014  . Hyperlipidemia 03/02/2014  . Obesity with serious comorbidity 01/16/2012    Carney Living PT DPT  07/02/2019, 8:33 AM  [facility] [address] [place], [place], [address] Phone: [phone]   Fax:  [phone]  Name: Addaley Palomera MRN: [id number] Date of Birth: 03-Oct-1960

## 2019-07-02 NOTE — Progress Notes (Signed)
Lisa Briggs Date of Birth: 03/14/1961 MRN: [id number] Primary Care Provider:Tysinger, Leward Quan  Date: 07/06/19 Last Office Visit: 04/17/2019  Chief Complaint  Patient presents with  . Cardiomyopathy  . Follow-up  . Results    echo    HPI  Lisa Briggs is a 59 y.o.  female who presents to the office with a chief complaint of " follow-up on test results." Patient's past medical history and cardiovascular risk factors include: Seropositive rheumatoid arthritis (Deveshwar), osteoarthritis, DDD, benign essential hypertension, transient ischemic attack, mildly reduced left ventricular systolic function consistent with cardiomyopathy, obesity.   Patient was last seen in the office on 04/17/2019 by Miquel Dunn, MSN, APRN, FNP-C.  At last office visit she presented for preoperative or stratification and underwent an echocardiogram was noted to have mildly reduced left ventricular systolic function.  Patient was advised to continue medical therapy and to have her LV function reassessed in 3 months.  Patient underwent an echocardiogram recently and was noted to have LVEF of 45 to 50%.  Clinically patient is not in congestive heart failure.  She does not check her weight on a daily basis.  She denies orthopnea, paroxysmal nocturnal dyspnea or lower extremity swelling.  History of transient ischemic attack, cardiomyopathy (mildly reduced LVEF). Denies prior history of coronary artery disease, myocardial infarction, congestive heart failure, deep venous thrombosis, pulmonary embolism, stroke.  FUNCTIONAL STATUS: Walks with a 4 wheel walker. No regular routine or exercise program.    ALLERGIES: Allergies  Allergen Reactions  . Kiwi Extract Hives and Itching  . Mucinex Dm [Dm-Guaifenesin Er] Nausea Only    Messes up her stomach. Pt report on 01/21/15  . Peach [Prunus Persica] Itching    Peach peeling makes pt itch     MEDICATION LIST PRIOR TO VISIT: Current Outpatient Medications  on File Prior to Visit  Medication Sig Dispense Refill  . aspirin EC 81 MG tablet Take 1 tablet (81 mg total) by mouth daily. 90 tablet 3  . cholecalciferol (VITAMIN D3) 25 MCG (1000 UT) tablet Take 1 tablet (1,000 Units total) by mouth daily. 90 tablet 3  . cyclobenzaprine (FLEXERIL) 10 MG tablet TAKE 1 TABLET TWICE A DAY AS NEEDED FOR MUSCLE SPASMS (Patient taking differently: Take 10 mg by mouth 2 (two) times daily as needed for muscle spasms. ) 60 tablet 0  . dexlansoprazole (DEXILANT) 60 MG capsule Take 1 capsule (60 mg total) by mouth daily. 90 capsule 1  . diclofenac sodium (VOLTAREN) 1 % GEL APPLY 2 GRAMS TO AFFECTED AREA 4 TIMES A DAY (Patient taking differently: Apply 2 g topically 4 (four) times daily as needed (pain). ) 300 g 3  . docusate sodium (COLACE) 100 MG capsule Take 1 capsule (100 mg total) by mouth 2 (two) times daily. 40 capsule 0  . famotidine (PEPCID) 20 MG tablet TAKE 1 TABLET DAILY AT BEDTIME 90 tablet 3  . gabapentin (NEURONTIN) 300 MG capsule Take 1 capsule (300 mg total) by mouth 3 (three) times daily. 270 capsule 3  . hydrochlorothiazide (HYDRODIURIL) 12.5 MG tablet Take 1 tablet (12.5 mg total) by mouth daily. 90 tablet 3  . hydrocortisone (PROCTOCORT) 1 % CREA Apply 1 application topically daily. 45 g 1  . hydroxychloroquine (PLAQUENIL) 200 MG tablet TAKE 1 TABLET TWICE A DAY FOR RHEUMATOID ARTHRITIS 180 tablet 0  . linaclotide (LINZESS) 145 MCG CAPS capsule Take 1 capsule (145 mcg total) by mouth daily before breakfast. 90 capsule 1  . loratadine (CLARITIN) 10 MG  tablet Take 1 tablet (10 mg total) by mouth daily. 90 tablet 3  . losartan (COZAAR) 50 MG tablet Take 50 mg by mouth daily.    . meloxicam (MOBIC) 15 MG tablet Take 1 tablet (15 mg total) by mouth daily. 90 tablet 3  . methocarbamol (ROBAXIN) 500 MG tablet Take 1 tablet (500 mg total) by mouth every 6 (six) hours as needed for muscle spasms. 50 tablet 0  . Misc. Devices (ROLLATOR ULTRA-LIGHT) MISC Use as  directed. 1 each 0  . ondansetron (ZOFRAN) 4 MG tablet Take 1 tablet (4 mg total) by mouth every 8 (eight) hours as needed for nausea or vomiting. TAKE ONE TABLET BY MOUTH EVERY 8 HOURS AS NEEDED FOR  NAUSEA  OR  VOMITING 90 tablet 0  . potassium chloride (K-DUR) 10 MEQ tablet Take 1 tablet (10 mEq total) by mouth 2 (two) times daily. 180 tablet 3  . simvastatin (ZOCOR) 20 MG tablet TAKE 1 TABLET DAILY (Patient taking differently: Take 20 mg by mouth daily at 6 PM. ) 90 tablet 3  . venlafaxine (EFFEXOR) 37.5 MG tablet Take 1 tablet (37.5 mg total) by mouth daily. 90 tablet 1   No current facility-administered medications on file prior to visit.    PAST MEDICAL HISTORY: Past Medical History:  Diagnosis Date  . Allergy   . Anemia    iron therapy for years as of 10/12; normal Hgb 08/2013  . Anxiety   . Arthritis   . Chest pain 04/05/2011   cardiac eval, normal treadmill stress test, Dr. Tollie Eth  . Chronic back pain   . Constipation   . Farsightedness    wears glasses, [facility]  . 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 [name], [facility]  . GERD (gastroesophageal reflux disease)   . History of uterine fibroid   . Hyperlipidemia   . Hypertension   . Paresthesia 09/2014   initially thought to be TIA, neurology consult in 12/2014 with other non TIA considerations.    . Polyarthralgia    normal rheumatoid screen 01/2012  . TIA (transient ischemic attack) 2015/16    PAST SURGICAL HISTORY: Past Surgical History:  Procedure Laterality Date  . COLONOSCOPY  01/2014   diverticulosis, othwerise normal - Dr. Owens Loffler  . ESOPHAGOGASTRODUODENOSCOPY  2013   Dr. Benson Norway, gastritis  . ESOPHAGOGASTRODUODENOSCOPY  [id number]  . EUS N/A 04/16/2014   Procedure: UPPER ENDOSCOPIC ULTRASOUND (EUS) LINEAR;  Surgeon: Milus Banister, MD;  Location: [facility];  Service: Endoscopy;  Laterality: N/A;  . gall stone surgery    . KNEE ARTHROSCOPY Left   .  LAPAROSCOPIC GASTRIC RESECTION N/A 06/09/2014   Procedure: LAPAROSCOPIC GASTRIC MASS RESECTION;  Surgeon: [name], MD;  Location: [facility] OR[facility];  Service: General;  Laterality: N/A;  . LIPOMA EXCISION     forehead  . LUMBAR LAMINECTOMY N/A 04/25/2019   Procedure: LEFT L2-3 MICRODISCECTOMY, BILATERAL L5-S1 PARTIAL HEMILAMINECTOMY;  Surgeon: Jessy Oto, MD;  Location: [place];  Service: Orthopedics;  Laterality: N/A;  . UPPER GASTROINTESTINAL ENDOSCOPY    . UTERINE FIBROID SURGERY      FAMILY HISTORY: The patient's family history includes Arthritis in her mother; Breast cancer in her mother and sister; Colon cancer (age of onset: 23) in her brother; Diabetes in her brother and maternal aunt; GER disease in her mother; Glaucoma in her mother; Healthy in her son; Heart disease in her maternal aunt, maternal grandfather, maternal grandmother, and maternal uncle; Hypertension  in her mother, sister, and sister; Leukemia in her sister; Stroke in her father and maternal uncle.   SOCIAL HISTORY:  The patient  reports that she has never smoked. She has never used smokeless tobacco. She reports that she does not drink alcohol or use drugs.  Review of Systems  Constitution: Positive for weight gain. Negative for chills and fever.  HENT: Negative for ear discharge, ear pain and nosebleeds.   Eyes: Negative for blurred vision and discharge.  Cardiovascular: Negative for chest pain, claudication, dyspnea on exertion, leg swelling, near-syncope, orthopnea, palpitations, paroxysmal nocturnal dyspnea and syncope.  Respiratory: Negative for cough and shortness of breath.   Endocrine: Negative for polydipsia, polyphagia and polyuria.  Hematologic/Lymphatic: Negative for bleeding problem.  Skin: Negative for flushing and nail changes.  Musculoskeletal: Negative for muscle cramps, muscle weakness and myalgias.  Gastrointestinal: Negative for abdominal pain, dysphagia, hematemesis, hematochezia, melena, nausea and  vomiting.  Neurological: Negative for dizziness, focal weakness and light-headedness.    PHYSICAL EXAM: Vitals with BMI 07/02/2019 06/09/2019 05/12/2019  Height 5\' 9"  5\' 9"  5\' 9"   Weight 252 lbs 230 lbs 241 lbs  BMI 37.2 [id number]  Systolic 0000000 [id number] 97  Diastolic 81 81 66  Pulse 96 77 85   CONSTITUTIONAL: Age-appropriate African-American female, obese, no acute distress, hemodynamically stable.  SKIN: Skin is warm and dry. No rash noted. No cyanosis. No pallor. No jaundice HEAD: Normocephalic and atraumatic.  EYES: No scleral icterus MOUTH/THROAT: Moist oral membranes.  NECK: No JVD present. No thyromegaly noted. No carotid bruits  LYMPHATIC: No visible cervical adenopathy.  CHEST Normal respiratory effort. No intercostal retractions  LUNGS: Clear to auscultation bilaterally.  No stridor. No wheezes. No rales.  CARDIOVASCULAR: Regular rate and rhythm, positive S1-S2, no murmurs rubs or gallops appreciated ABDOMINAL: No apparent ascites.  EXTREMITIES: No peripheral edema  HEMATOLOGIC: No significant bruising NEUROLOGIC: Oriented to person, place, and time. Nonfocal. Normal muscle tone.  PSYCHIATRIC: Normal mood and affect. Normal behavior. Cooperative  CARDIAC DATABASE: EKG: 04/17/2019: Normal sinus rhythm at 85 bpm, normal axis, LVH. Nonspecific T wave abnormality.   Echocardiogram: 04/17/2019: LVEF [id number], grade 2 diastolic dysfunction, elevated left atrial pressure, mild TR, no pulmonary hypertension.  06/05/2019: LVEF 45-50%, normal wall thickness, mild global hypokinesis, grade 1 diastolic impairment, normal left atrial pressure, mild MR, mild TR, mild TR, no pulmonary hypertension.  Stress Testing:  Greater than 5 years ago.   Heart Catheterization: None  LABORATORY DATA: CBC Latest Ref Rng & Units 04/26/2019 04/15/2019 03/04/2019  WBC 4.0 - 10.5 K/uL 11.7(H) 6.5 8.3  Hemoglobin 12.0 - 15.0 g/dL 11.3(L) 12.3 12.4  Hematocrit 36.0 - 46.0 % 36.4 41.6 38.6  Platelets 150 -  400 K/uL 304 350 399    CMP Latest Ref Rng & Units 04/26/2019 04/15/2019 03/04/2019  Glucose 70 - 99 mg/dL 100(H) 81 88  BUN 6 - 20 mg/dL 11 15 17   Creatinine 0.44 - 1.00 mg/dL 0.73 0.73 0.80  Sodium 135 - 145 mmol/L 138 140 142  Potassium 3.5 - 5.1 mmol/L 3.7 3.8 3.9  Chloride 98 - 111 mmol/L 100 102 102  CO2 22 - 32 mmol/L 27 28 30   Calcium 8.9 - 10.3 mg/dL 8.9 9.7 9.9  Total Protein 6.5 - 8.1 g/dL - 7.3 7.3  Total Bilirubin 0.3 - 1.2 mg/dL - 0.4 0.4  Alkaline Phos 38 - 126 U/L - 114 -  AST 15 - 41 U/L - 18 19  ALT 0 - 44 U/L - 17 18  Lipid Panel     Component Value Date/Time   CHOL 157 08/30/2018 1212   TRIG 95 08/30/2018 1212   HDL 71 08/30/2018 1212   CHOLHDL 2.2 08/30/2018 1212   CHOLHDL 2.1 04/20/2016 1122   VLDL 9 04/20/2016 1122   LDLCALC 67 08/30/2018 1212   LABVLDL 19 08/30/2018 1212    Lab Results  Component Value Date   HGBA1C 5.9 (H) 12/17/2018   HGBA1C 5.9 (H) 04/11/2018   HGBA1C 5.8 (H) 08/13/2017   No components found for: NTPROBNP Lab Results  Component Value Date   TSH 1.860 08/30/2018   TSH 0.56 09/20/2016   TSH 0.778 03/22/2015    Cardiac Panel (last 3 results) No results for input(s): CKTOTAL, CKMB, TROPONINIHS, RELINDX in the last 72 hours.  FINAL MEDICATION LIST END OF ENCOUNTER: Meds ordered this encounter  Medications  . carvedilol (COREG) 6.25 MG tablet    Sig: Take 1 tablet (6.25 mg total) by mouth 2 (two) times daily. Hold if systolic blood pressure (top blood pressure number) less than 100 mmHg or heart rate less than 60 bpm (pulse).    Dispense:  180 tablet    Refill:  3    There are no discontinued medications.   Current Outpatient Medications:  .  aspirin EC 81 MG tablet, Take 1 tablet (81 mg total) by mouth daily., Disp: 90 tablet, Rfl: 3 .  cholecalciferol (VITAMIN D3) 25 MCG (1000 UT) tablet, Take 1 tablet (1,000 Units total) by mouth daily., Disp: 90 tablet, Rfl: 3 .  cyclobenzaprine (FLEXERIL) 10 MG tablet, TAKE 1  TABLET TWICE A DAY AS NEEDED FOR MUSCLE SPASMS (Patient taking differently: Take 10 mg by mouth 2 (two) times daily as needed for muscle spasms. ), Disp: 60 tablet, Rfl: 0 .  dexlansoprazole (DEXILANT) 60 MG capsule, Take 1 capsule (60 mg total) by mouth daily., Disp: 90 capsule, Rfl: 1 .  diclofenac sodium (VOLTAREN) 1 % GEL, APPLY 2 GRAMS TO AFFECTED AREA 4 TIMES A DAY (Patient taking differently: Apply 2 g topically 4 (four) times daily as needed (pain). ), Disp: 300 g, Rfl: 3 .  docusate sodium (COLACE) 100 MG capsule, Take 1 capsule (100 mg total) by mouth 2 (two) times daily., Disp: 40 capsule, Rfl: 0 .  famotidine (PEPCID) 20 MG tablet, TAKE 1 TABLET DAILY AT BEDTIME, Disp: 90 tablet, Rfl: 3 .  gabapentin (NEURONTIN) 300 MG capsule, Take 1 capsule (300 mg total) by mouth 3 (three) times daily., Disp: 270 capsule, Rfl: 3 .  hydrochlorothiazide (HYDRODIURIL) 12.5 MG tablet, Take 1 tablet (12.5 mg total) by mouth daily., Disp: 90 tablet, Rfl: 3 .  hydrocortisone (PROCTOCORT) 1 % CREA, Apply 1 application topically daily., Disp: 45 g, Rfl: 1 .  hydroxychloroquine (PLAQUENIL) 200 MG tablet, TAKE 1 TABLET TWICE A DAY FOR RHEUMATOID ARTHRITIS, Disp: 180 tablet, Rfl: 0 .  linaclotide (LINZESS) 145 MCG CAPS capsule, Take 1 capsule (145 mcg total) by mouth daily before breakfast., Disp: 90 capsule, Rfl: 1 .  loratadine (CLARITIN) 10 MG tablet, Take 1 tablet (10 mg total) by mouth daily., Disp: 90 tablet, Rfl: 3 .  losartan (COZAAR) 50 MG tablet, Take 50 mg by mouth daily., Disp: , Rfl:  .  meloxicam (MOBIC) 15 MG tablet, Take 1 tablet (15 mg total) by mouth daily., Disp: 90 tablet, Rfl: 3 .  methocarbamol (ROBAXIN) 500 MG tablet, Take 1 tablet (500 mg total) by mouth every 6 (six) hours as needed for muscle spasms., Disp: 50 tablet, Rfl:  0 .  Misc. Devices (ROLLATOR ULTRA-LIGHT) MISC, Use as directed., Disp: 1 each, Rfl: 0 .  ondansetron (ZOFRAN) 4 MG tablet, Take 1 tablet (4 mg total) by mouth every 8  (eight) hours as needed for nausea or vomiting. TAKE ONE TABLET BY MOUTH EVERY 8 HOURS AS NEEDED FOR  NAUSEA  OR  VOMITING, Disp: 90 tablet, Rfl: 0 .  potassium chloride (K-DUR) 10 MEQ tablet, Take 1 tablet (10 mEq total) by mouth 2 (two) times daily., Disp: 180 tablet, Rfl: 3 .  simvastatin (ZOCOR) 20 MG tablet, TAKE 1 TABLET DAILY (Patient taking differently: Take 20 mg by mouth daily at 6 PM. ), Disp: 90 tablet, Rfl: 3 .  venlafaxine (EFFEXOR) 37.5 MG tablet, Take 1 tablet (37.5 mg total) by mouth daily., Disp: 90 tablet, Rfl: 1 .  carvedilol (COREG) 6.25 MG tablet, Take 1 tablet (6.25 mg total) by mouth 2 (two) times daily. Hold if systolic blood pressure (top blood pressure number) less than 100 mmHg or heart rate less than 60 bpm (pulse)., Disp: 180 tablet, Rfl: 3  IMPRESSION:    ICD-10-CM   1. Cardiomyopathy, unspecified type (HCC)  I42.9 carvedilol (COREG) 6.25 MG tablet    PCV MYOCARDIAL PERFUSION WITH LEXISCAN  2. Benign hypertension  I10   3. Mixed hyperlipidemia  E78.2   4. Abnormal EKG  R94.31 PCV MYOCARDIAL PERFUSION WITH LEXISCAN  5. Class 2 severe obesity due to excess calories with serious comorbidity and body mass index (BMI) of 37.0 to 37.9 in adult (HCC)  E66.01    Z68.37   6. History of TIA (transient ischemic attack)  Z86.73      RECOMMENDATIONS: Griselda Ropp is a 59 y.o. female whose past medical history and cardiovascular risk factors include: Seropositive rheumatoid arthritis (Deveshwar), osteoarthritis, DDD, benign essential hypertension, transient ischemic attack, mildly reduced left ventricular systolic function consistent with cardiomyopathy, obesity.  Cardiomyopathy, unspecified:  Patient continues to have mildly reduced left ventricular systolic function consistent with a degree of cardiomyopathy.  I suspect that the underlying cardiomyopathy is probably nonischemic in nature; however, would recommend nuclear stress test to rule out an ischemic substrate.   Exercise stress test was not ordered as the patient is functionally not capable to perform.  Education importance of medication compliance and guideline directed medical therapy  Continue losartan.  Continue hydrochlorothiazide   Continue statin therapy  Add Coreg 6.25 mg p.o. twice daily  Educated on the importance of risk factor modifications given her multiple modifiable cardiovascular risk factors  Encouraged to increase physical activity as tolerated with a goal of 30 minutes a day 5 days a week  No need to increase diuretic therapy at this time as her left atrial pressure has normalized and she has grade 1 diastolic impairment on most recent echocardiogram.  Abnormal EKG:  Given the fact that the LV function is continued to be mildly reduced and last office visit she had an EKG notable for nonspecific T wave abnormalities would recommend stress test to rule out underlying ischemia.  Currently patient is asymptomatic in regards to having no chest pain or anginal equivalent.  History of TIA: Currently on aspirin and statin therapy.  Continue risk factor modifications for secondary prevention  Obesity, due to excess calories: . Body mass index is 37.21 kg/m. . I reviewed with the patient the importance of diet, regular physical activity/exercise, weight loss.   . Patient is educated on increasing physical activity gradually as tolerated.  With the goal of moderate intensity exercise  for 30 minutes a day 5 days a week.  Orders Placed This Encounter  Procedures  . PCV MYOCARDIAL PERFUSION WITH LEXISCAN   --Continue cardiac medications as reconciled in final medication list. --Return in about 5 weeks (around 08/06/2019). Or sooner if needed. --Continue follow-up with your primary care physician regarding the management of your other chronic comorbid conditions.  Patient's questions and concerns were addressed to her satisfaction. She voices understanding of the instructions  provided during this encounter.   This note was created using a voice recognition software as a result there may be grammatical errors inadvertently enclosed that do not reflect the nature of this encounter. Every attempt is made to correct such errors.  Rex Kras, [place], [facility]  Pager: [phone] Office: [phone]

## 2019-07-02 NOTE — Patient Instructions (Signed)
Please remember to bring in your medication bottles in at the next visit.   New Medications that were added at today's visit:  Coreg 6.25mg  po bid.   Medications that were discontinued at today's visit: None  Office will call you to have the following tests scheduled:  Stress test   Please follow-up with your primary care provider to get screened for diabetes and follow-up on lipid profile.

## 2019-07-03 ENCOUNTER — Ambulatory Visit: Payer: BC Managed Care – PPO | Attending: Specialist | Admitting: Physical Therapy

## 2019-07-03 ENCOUNTER — Encounter: Payer: Self-pay | Admitting: Physical Therapy

## 2019-07-03 ENCOUNTER — Other Ambulatory Visit: Payer: Self-pay

## 2019-07-03 DIAGNOSIS — M6281 Muscle weakness (generalized): Secondary | ICD-10-CM | POA: Insufficient documentation

## 2019-07-03 DIAGNOSIS — M25522 Pain in left elbow: Secondary | ICD-10-CM | POA: Insufficient documentation

## 2019-07-03 DIAGNOSIS — M5442 Lumbago with sciatica, left side: Secondary | ICD-10-CM | POA: Insufficient documentation

## 2019-07-03 DIAGNOSIS — M25652 Stiffness of left hip, not elsewhere classified: Secondary | ICD-10-CM

## 2019-07-03 DIAGNOSIS — M62830 Muscle spasm of back: Secondary | ICD-10-CM | POA: Insufficient documentation

## 2019-07-03 DIAGNOSIS — M25651 Stiffness of right hip, not elsewhere classified: Secondary | ICD-10-CM | POA: Diagnosis not present

## 2019-07-03 DIAGNOSIS — M25521 Pain in right elbow: Secondary | ICD-10-CM | POA: Insufficient documentation

## 2019-07-03 DIAGNOSIS — G8929 Other chronic pain: Secondary | ICD-10-CM | POA: Diagnosis not present

## 2019-07-03 DIAGNOSIS — M5441 Lumbago with sciatica, right side: Secondary | ICD-10-CM | POA: Insufficient documentation

## 2019-07-03 DIAGNOSIS — R262 Difficulty in walking, not elsewhere classified: Secondary | ICD-10-CM | POA: Diagnosis not present

## 2019-07-03 DIAGNOSIS — R600 Localized edema: Secondary | ICD-10-CM | POA: Diagnosis not present

## 2019-07-03 NOTE — Therapy (Signed)
[place], [place], [address] Phone: [phone]   Fax:  [phone]  Physical Therapy Treatment  Patient Details  Name: Lisa Briggs MRN: [id number] Date of Birth: 11/22/1960 Referring Provider (PT): Louanne Skye   Encounter Date: 07/03/2019  PT End of Session - 07/03/19 1624    Visit Number  5    Number of Visits  12    Date for PT Re-Evaluation  07/25/19    Authorization Type  [employer]    PT Start Time  1324    PT Stop Time  1713    PT Time Calculation (min)  56 min    Activity Tolerance  Patient tolerated treatment well    Behavior During Therapy  [facility] for tasks assessed/performed       Past Medical History:  Diagnosis Date  . Allergy   . Anemia    iron therapy for years as of 10/12; normal Hgb 08/2013  . Anxiety   . Arthritis   . Chest pain 04/05/2011   cardiac eval, normal treadmill stress test, Dr. Tollie Eth  . Chronic back pain   . Constipation   . Farsightedness    wears glasses, Eye care center  . 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 [name], [facility] Surgery  . GERD (gastroesophageal reflux disease)   . History of uterine fibroid   . Hyperlipidemia   . Hypertension   . Paresthesia 09/2014   initially thought to be TIA, neurology consult in 12/2014 with other non TIA considerations.    . Polyarthralgia    normal rheumatoid screen 01/2012  . TIA (transient ischemic attack) 2015/16    Past Surgical History:  Procedure Laterality Date  . COLONOSCOPY  01/2014   diverticulosis, othwerise normal - Dr. Owens Loffler  . ESOPHAGOGASTRODUODENOSCOPY  2013   Dr. Benson Norway, gastritis  . ESOPHAGOGASTRODUODENOSCOPY  [id number]  . EUS N/A 04/16/2014   Procedure: UPPER ENDOSCOPIC ULTRASOUND (EUS) LINEAR;  Surgeon: Milus Banister, MD;  Location: [facility];  Service: Endoscopy;  Laterality: N/A;  . gall stone surgery    . KNEE ARTHROSCOPY Left   . LAPAROSCOPIC GASTRIC  RESECTION N/A 06/09/2014   Procedure: LAPAROSCOPIC GASTRIC MASS RESECTION;  Surgeon: [name], MD;  Location: [facility] OR[facility];  Service: General;  Laterality: N/A;  . LIPOMA EXCISION     forehead  . LUMBAR LAMINECTOMY N/A 04/25/2019   Procedure: LEFT L2-3 MICRODISCECTOMY, BILATERAL L5-S1 PARTIAL HEMILAMINECTOMY;  Surgeon: Jessy Oto, MD;  Location: [facility];  Service: Orthopedics;  Laterality: N/A;  . UPPER GASTROINTESTINAL ENDOSCOPY    . UTERINE FIBROID SURGERY      There were no vitals filed for this visit.  Subjective Assessment - 07/03/19 1623    Subjective  Still noting some soreness particularly at night but back feeling OK today. Continues with rollator use for ambulation.    Currently in Pain?  No/denies         [facility] PT Assessment - 07/03/19 0001      Strength   Right Hip Flexion  4-/5    Left Hip Flexion  4/5                   [facility] Adult PT Treatment/Exercise - 07/03/19 0001      Lumbar Exercises: Stretches   Passive Hamstring Stretch  Right;Left;2 reps;30 seconds    Piriformis Stretch  Right;Left;2 reps;30 seconds    Other Lumbar Stretch Exercise  supine (left) dural nerve floss x  10 reps      Lumbar Exercises: Aerobic   Nustep  L5 x 6 min UE/LE      Lumbar Exercises: Standing   Heel Raises  20 reps    Heel Raises Limitations  on Airex    Functional Squats Limitations  see sit<>stand    Row  AROM;Strengthening;Right;Left;20 reps    Theraband Level (Row)  Level 4 (Blue)    Row Limitations  cues for angle of elbow flexion    Shoulder Extension  AROM;Strengthening;Both;20 reps    Theraband Level (Shoulder Extension)  Level 3 (Green)    Other Standing Lumbar Exercises  paff of press with blue Theraband 2x10 ea. way    Other Standing Lumbar Exercises  Rocker board side to side at counter with SBA x 1 min (dynamic balance), Airex marches x 20 with SBA      Lumbar Exercises: Seated   Sit to Stand  15 reps    Sit to Stand Limitations  from edge of  high-low table raised to 24 in.      Lumbar Exercises: Supine   Clam  20 reps    Clam Limitations  red Theraband    Bent Knee Raise  15 reps    Bent Knee Raise Limitations  alt. opposite UE/LE raises    Bridge Limitations  partial bridge x 15 reps with legs on reversed blue incline wedge      Cryotherapy   Number Minutes Cryotherapy  10 Minutes    Cryotherapy Location  Lumbar Spine   supine with legs propped on cushion   Type of Cryotherapy  Ice pack             PT Education - 07/03/19 1705    Education Details  exercises, POC    Person(s) Educated  Patient    Methods  Explanation;Demonstration;Verbal cues    Comprehension  Verbalized understanding;Returned demonstration       PT Short Term Goals - 07/02/19 0832      PT SHORT TERM GOAL #1   Title  Patient to be independent in initial HEP for flexibility and conditioning    Time  3    Period  Weeks    Status  New    Target Date  07/04/19      PT SHORT TERM GOAL #2   Title  Patient to ambulate with SPC and S x 200' without LOB and pain no greater than 3/10    Time  3    Period  Weeks    Status  On-going    Target Date  07/04/19      PT SHORT TERM GOAL #3   Title  Patient to demonstrate sit to stand without UE support x 5 reps and no LOB    Time  3    Period  Weeks    Status  On-going    Target Date  07/04/19        PT Long Term Goals - 07/03/19 1649      PT LONG TERM GOAL #1   Title  Independent with HEP    Baseline  met    Time  6    Period  Weeks    Status  Achieved      PT LONG TERM GOAL #2   Title  Improve FOTO outcome measure score to 45% or less impairment    Baseline  43%    Time  6    Period  Weeks    Status  On-going      PT LONG TERM GOAL #3   Title  Patient to demonstrate hip flexion strength at least 4-/5 for improved mobility and gait.    Baseline  right 4-/5, left 4/5    Time  6    Period  Weeks    Status  Achieved      PT LONG TERM GOAL #4   Title  Patient to demonstrate  decreased fall risk with 5 times sit to stand in 15 seconds or less    Baseline  ongoing    Time  6    Period  Weeks    Status  On-going      PT LONG TERM GOAL #5   Title  Patient to ambulate 20 minutes without rest and pain no greater than 5/10.    Baseline  goal ongoing    Time  6    Period  Weeks    Status  On-going            Plan - 07/03/19 1705    Clinical Impression Statement  Continues with gait/balance limitations s/p surgery and still some difficulty with sit>stand particularly from low chairs but showing some improvements with strength from baseline (hip flexion with improved ability sit<>supine). Pt. will need ongoing PT to work on core + LE strengthening and balance to improve functional status for mobility.    Personal Factors and Comorbidities  Fitness;Comorbidity 1;Comorbidity 2;Comorbidity 3+    Comorbidities  Obesity, HTN, TIA, DJD, GERD, allergies    Examination-Activity Limitations  Dressing;Lift;Locomotion Level;Sleep;Stand;Transfers    Examination-Participation Restrictions  Community Activity;Shop;Meal Prep;Cleaning    Stability/Clinical Decision Making  Evolving/Moderate complexity    Clinical Decision Making  Moderate    Rehab Potential  Good    PT Frequency  2x / week    PT Duration  6 weeks    PT Treatment/Interventions  ADLs/Self Care Home Management;Cryotherapy;Electrical Stimulation;Iontophoresis 81m/ml Dexamethasone;Moist Heat;Ultrasound;Traction;Stair training;DME Instruction;Gait training;Functional mobility training;Therapeutic activities;Therapeutic exercise;Balance training;Patient/family education;Manual techniques;Passive range of motion;Scar mobilization;Taping;Spinal Manipulations;Joint Manipulations    PT Next Visit Plan  try step ups if tolerated, continue LE/Core/back strengthening, balance/gait, stretches, cryo post-tx.    PT Home Exercise Plan  seated hamstring stretch, seated piriformis stretch, pelvic tilts, SKTC, hip adduction  isometric, sit<>stand    Consulted and Agree with Plan of Care  Patient       Patient will benefit from skilled therapeutic intervention in order to improve the following deficits and impairments:  Abnormal gait, Decreased activity tolerance, Decreased balance, Decreased endurance, Decreased mobility, Decreased strength, Difficulty walking, Hypomobility, Impaired flexibility, Postural dysfunction, Pain, Impaired perceived functional ability  Visit Diagnosis: Chronic bilateral low back pain with bilateral sciatica  Difficulty in walking, not elsewhere classified  Stiffness of left hip, not elsewhere classified  Stiffness of right hip, not elsewhere classified     Problem List Patient Active Problem List   Diagnosis Date Noted  . 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 Spinal stenosis of lumbar region 04/25/2019    Class: Chronic  . Status post lumbar laminectomy 04/25/2019  . Neck pain 11/12/2018  . Chronic pain of right knee 09/13/2018  . Bruising 09/13/2018  . SOB (shortness of breath) 08/30/2018  . Chronic pain of left knee 07/08/2018  . Chronic hip pain 07/08/2018  . Edema 07/08/2018  . Left foot pain 04/08/2018  . Olecranon bursitis of left elbow 04/05/2018  . Leg swelling 12/05/2017  . Need for shingles vaccine 12/05/2017  .  Primary osteoarthritis of both hands 11/23/2017  . Rheumatoid factor positive 11/23/2017  . Primary osteoarthritis of both knees 11/23/2017  . Primary osteoarthritis of both feet 11/23/2017  . DDD (degenerative disc disease), lumbar 11/23/2017  . Vaccine counseling 08/13/2017  . Polyarthralgia 08/13/2017  . Joint stiffness 08/13/2017  . Sensitive skin 01/23/2017  . Need for influenza vaccination 01/23/2017  . Proteinuria 01/23/2017  . History of gastrointestinal stromal tumor (GIST) 04/20/2016  . History of TIA (transient ischemic attack) 04/20/2016  . Screening for breast cancer 04/20/2016  . Estrogen  deficiency 04/20/2016  . Chronic maxillary sinusitis 04/20/2016  . Impaired fasting blood sugar 04/20/2016  . Constipation 04/20/2016  . History of fall 04/20/2016  . Chronic radicular lumbar pain 01/27/2016  . Encounter for health maintenance examination in adult 03/22/2015  . Paresthesia 03/22/2015  . Cognitive decline 03/22/2015  . Screening for cervical cancer 03/22/2015  . Vitamin D deficiency 03/22/2015  . History of uterine leiomyoma 03/22/2015  . Gastroesophageal reflux disease without esophagitis 03/02/2014  . Chronic nausea 03/02/2014  . Chronic abdominal pain 03/02/2014  . Rhinitis, allergic 03/02/2014  . Essential hypertension 03/02/2014  . Hyperlipidemia 03/02/2014  . Obesity with serious comorbidity 01/16/2012    Beaulah Dinning, PT, DPT 07/03/19 5:08 PM  [facility] [address] [place], [place], [address] Phone: [phone]   Fax:  [phone]  Name: Lisa Briggs MRN: [id number] Date of Birth: 1960/05/05

## 2019-07-07 ENCOUNTER — Ambulatory Visit (INDEPENDENT_AMBULATORY_CARE_PROVIDER_SITE_OTHER): Payer: BC Managed Care – PPO | Admitting: Physician Assistant

## 2019-07-07 ENCOUNTER — Other Ambulatory Visit: Payer: Self-pay

## 2019-07-07 ENCOUNTER — Encounter: Payer: Self-pay | Admitting: Physician Assistant

## 2019-07-07 VITALS — BP 118/76 | HR 81 | Resp 16 | Ht 69.0 in | Wt 254.8 lb

## 2019-07-07 DIAGNOSIS — M19072 Primary osteoarthritis, left ankle and foot: Secondary | ICD-10-CM

## 2019-07-07 DIAGNOSIS — I10 Essential (primary) hypertension: Secondary | ICD-10-CM

## 2019-07-07 DIAGNOSIS — M170 Bilateral primary osteoarthritis of knee: Secondary | ICD-10-CM

## 2019-07-07 DIAGNOSIS — M0579 Rheumatoid arthritis with rheumatoid factor of multiple sites without organ or systems involvement: Secondary | ICD-10-CM

## 2019-07-07 DIAGNOSIS — M19041 Primary osteoarthritis, right hand: Secondary | ICD-10-CM | POA: Diagnosis not present

## 2019-07-07 DIAGNOSIS — Z8673 Personal history of transient ischemic attack (TIA), and cerebral infarction without residual deficits: Secondary | ICD-10-CM

## 2019-07-07 DIAGNOSIS — K219 Gastro-esophageal reflux disease without esophagitis: Secondary | ICD-10-CM

## 2019-07-07 DIAGNOSIS — M71122 Other infective bursitis, left elbow: Secondary | ICD-10-CM

## 2019-07-07 DIAGNOSIS — Z79899 Other long term (current) drug therapy: Secondary | ICD-10-CM

## 2019-07-07 DIAGNOSIS — E559 Vitamin D deficiency, unspecified: Secondary | ICD-10-CM

## 2019-07-07 DIAGNOSIS — M19042 Primary osteoarthritis, left hand: Secondary | ICD-10-CM

## 2019-07-07 DIAGNOSIS — M19071 Primary osteoarthritis, right ankle and foot: Secondary | ICD-10-CM

## 2019-07-07 DIAGNOSIS — M7062 Trochanteric bursitis, left hip: Secondary | ICD-10-CM

## 2019-07-07 DIAGNOSIS — M51369 Other intervertebral disc degeneration, lumbar region without mention of lumbar back pain or lower extremity pain: Secondary | ICD-10-CM

## 2019-07-07 DIAGNOSIS — M5136 Other intervertebral disc degeneration, lumbar region: Secondary | ICD-10-CM

## 2019-07-07 DIAGNOSIS — M7061 Trochanteric bursitis, right hip: Secondary | ICD-10-CM

## 2019-07-07 NOTE — Patient Instructions (Signed)
Standing Labs We placed an order today for your standing lab work.    Please come back and get your standing labs in June and every 5 months   We have open lab daily Monday through Thursday from 8:30-12:30 PM and 1:30-4:30 PM and Friday from 8:30-12:30 PM and 1:30-4:00 PM at the office of Dr. Bo Merino.   You may experience shorter wait times on Monday and Friday afternoons. The office is located at [address], [place], [place],  [address] No appointment is necessary.   Labs are drawn by [facility].  You may receive a bill from [facility] for your lab work.  If you wish to have your labs drawn at another location, please call the office 24 hours in advance to send orders.  If you have any questions regarding directions or hours of operation,  please call [phone].   Just as a reminder please drink plenty of water prior to coming for your lab work. Thanks!    Hip Bursitis Rehab Ask your health care provider which exercises are safe for you. Do exercises exactly as told by your health care provider and adjust them as directed. It is normal to feel mild stretching, pulling, tightness, or discomfort as you do these exercises. Stop right away if you feel sudden pain or your pain gets worse. Do not begin these exercises until told by your health care provider. Stretching exercise This exercise warms up your muscles and joints and improves the movement and flexibility of your hip. This exercise also helps to relieve pain and stiffness. Iliotibial band stretch An iliotibial band is a strong band of muscle tissue that runs from the outer side of your hip to the outer side of your thigh and knee. 1. Lie on your side with your left / right leg in the top position. 2. Bend your left / right knee and grab your ankle. Stretch out your bottom arm to help you balance. 3. Slowly bring your knee back so your thigh is behind your body. 4. Slowly lower your knee toward the floor until you  feel a gentle stretch on the outside of your left / right thigh. If you do not feel a stretch and your knee will not fall farther, place the heel of your other foot on top of your knee and pull your knee down toward the floor with your foot. 5. Hold this position for __________ seconds. 6. Slowly return to the starting position. Repeat __________ times. Complete this exercise __________ times a day. Strengthening exercises These exercises build strength and endurance in your hip and pelvis. Endurance is the ability to use your muscles for a long time, even after they get tired. Bridge This exercise strengthens the muscles that move your thigh backward (hip extensors). 1. Lie on your back on a firm surface with your knees bent and your feet flat on the floor. 2. Tighten your buttocks muscles and lift your buttocks off the floor until your trunk is level with your thighs. ? Do not arch your back. ? You should feel the muscles working in your buttocks and the back of your thighs. If you do not feel these muscles, slide your feet 1-2 inches (2.5-5 cm) farther away from your buttocks. ? If this exercise is too easy, try doing it with your arms crossed over your chest. 3. Hold this position for __________ seconds. 4. Slowly lower your hips to the starting position. 5. Let your muscles relax completely after each repetition. Repeat __________  times. Complete this exercise __________ times a day. Squats This exercise strengthens the muscles in front of your thigh and knee (quadriceps). 1. Stand in front of a table, with your feet and knees pointing straight ahead. You may rest your hands on the table for balance but not for support. 2. Slowly bend your knees and lower your hips like you are going to sit in a chair. ? Keep your weight over your heels, not over your toes. ? Keep your lower legs upright so they are parallel with the table legs. ? Do not let your hips go lower than your knees. ? Do not  bend lower than told by your health care provider. ? If your hip pain increases, do not bend as low. 3. Hold the squat position for __________ seconds. 4. Slowly push with your legs to return to standing. Do not use your hands to pull yourself to standing. Repeat __________ times. Complete this exercise __________ times a day. Hip hike 1. Stand sideways on a bottom step. Stand on your left / right leg with your other foot unsupported next to the step. You can hold on to the railing or wall for balance if needed. 2. Keep your knees straight and your torso square. Then lift your left / right hip up toward the ceiling. 3. Hold this position for __________ seconds. 4. Slowly let your left / right hip lower toward the floor, past the starting position. Your foot should get closer to the floor. Do not lean or bend your knees. Repeat __________ times. Complete this exercise __________ times a day. Single leg stand 1. Without shoes, stand near a railing or in a doorway. You may hold on to the railing or door frame as needed for balance. 2. Squeeze your left / right buttock muscles, then lift up your other foot. ? Do not let your left / right hip push out to the side. ? It is helpful to stand in front of a mirror for this exercise so you can watch your hip. 3. Hold this position for __________ seconds. Repeat __________ times. Complete this exercise __________ times a day. This information is not intended to replace advice given to you by your health care provider. Make sure you discuss any questions you have with your health care provider. Document Revised: 07/15/2018 Document Reviewed: 07/15/2018 Elsevier Patient Education  [place].

## 2019-07-07 NOTE — Telephone Encounter (Signed)
Cardiology note refers to diabetes and cholesterol screening.   Thus, if no fasting CPX in chart record for past 12 months, set up for fasting physical.    If not due for CPX, then schedule fasting med check visit.

## 2019-07-08 ENCOUNTER — Ambulatory Visit: Payer: BC Managed Care – PPO | Attending: Internal Medicine

## 2019-07-08 ENCOUNTER — Ambulatory Visit: Payer: BC Managed Care – PPO

## 2019-07-08 ENCOUNTER — Telehealth: Payer: Self-pay | Admitting: Specialist

## 2019-07-08 DIAGNOSIS — G8929 Other chronic pain: Secondary | ICD-10-CM

## 2019-07-08 DIAGNOSIS — M25522 Pain in left elbow: Secondary | ICD-10-CM | POA: Diagnosis not present

## 2019-07-08 DIAGNOSIS — R262 Difficulty in walking, not elsewhere classified: Secondary | ICD-10-CM

## 2019-07-08 DIAGNOSIS — M5442 Lumbago with sciatica, left side: Secondary | ICD-10-CM

## 2019-07-08 DIAGNOSIS — M25651 Stiffness of right hip, not elsewhere classified: Secondary | ICD-10-CM | POA: Diagnosis not present

## 2019-07-08 DIAGNOSIS — R600 Localized edema: Secondary | ICD-10-CM | POA: Diagnosis not present

## 2019-07-08 DIAGNOSIS — Z23 Encounter for immunization: Secondary | ICD-10-CM

## 2019-07-08 DIAGNOSIS — M6281 Muscle weakness (generalized): Secondary | ICD-10-CM

## 2019-07-08 DIAGNOSIS — M62830 Muscle spasm of back: Secondary | ICD-10-CM

## 2019-07-08 DIAGNOSIS — M25652 Stiffness of left hip, not elsewhere classified: Secondary | ICD-10-CM

## 2019-07-08 DIAGNOSIS — M25521 Pain in right elbow: Secondary | ICD-10-CM | POA: Diagnosis not present

## 2019-07-08 DIAGNOSIS — M5441 Lumbago with sciatica, right side: Secondary | ICD-10-CM | POA: Diagnosis not present

## 2019-07-08 NOTE — Telephone Encounter (Signed)
Received a call from [place] w/ [facility] checking status of request. I advised we have not received,i gave her both our fax numbers for her to resend.

## 2019-07-08 NOTE — Progress Notes (Signed)
   Covid-19 Vaccination Clinic  Name:  Lisa Briggs    MRN: [id number] DOB: 12/24/1960  07/08/2019  [name] was observed post Covid-19 immunization for 15 minutes without incident. She was provided with Vaccine Information Sheet and instruction to access the V-Safe system.   [name] was instructed to call 911 with any severe reactions post vaccine: Marland Kitchen Difficulty breathing  . Swelling of face and throat  . A fast heartbeat  . A bad rash all over body  . Dizziness and weakness   Immunizations Administered    Name Date Dose VIS Date Route   Pfizer COVID-19 Vaccine 07/08/2019 11:12 AM 0.3 mL 03/14/2019 Intramuscular   Manufacturer: [employer], [employer]   Lot: [id number]   [place]: [id number]

## 2019-07-09 ENCOUNTER — Other Ambulatory Visit: Payer: Self-pay

## 2019-07-09 ENCOUNTER — Ambulatory Visit: Payer: BC Managed Care – PPO

## 2019-07-09 DIAGNOSIS — R9431 Abnormal electrocardiogram [ECG] [EKG]: Secondary | ICD-10-CM | POA: Diagnosis not present

## 2019-07-09 DIAGNOSIS — I429 Cardiomyopathy, unspecified: Secondary | ICD-10-CM

## 2019-07-09 NOTE — Therapy (Signed)
[place], [place], [address] Phone: [phone]   Fax:  [phone]  Physical Therapy Treatment  Patient Details  Name: Lisa Briggs MRN: [id number] Date of Birth: 1960/07/13 Referring Provider (PT): Louanne Skye   Encounter Date: 07/08/2019  PT End of Session - 07/09/19 0659    Visit Number  6    Number of Visits  12    Date for PT Re-Evaluation  07/25/19    Authorization Type  [employer]    PT Start Time  1621    PT Stop Time  1711    PT Time Calculation (min)  50 min    Activity Tolerance  Patient tolerated treatment well    Behavior During Therapy  [facility] for tasks assessed/performed       Past Medical History:  Diagnosis Date  . Allergy   . Anemia    iron therapy for years as of 10/12; normal Hgb 08/2013  . Anxiety   . Arthritis   . Chest pain 04/05/2011   cardiac eval, normal treadmill stress test, Dr. Tollie Eth  . Chronic back pain   . Constipation   . Farsightedness    wears glasses, [facility] care [facility]  . 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 [name], [facility]  . GERD (gastroesophageal reflux disease)   . History of uterine fibroid   . Hyperlipidemia   . Hypertension   . Paresthesia 09/2014   initially thought to be TIA, neurology consult in 12/2014 with other non TIA considerations.    . Polyarthralgia    normal rheumatoid screen 01/2012  . TIA (transient ischemic attack) 2015/16    Past Surgical History:  Procedure Laterality Date  . COLONOSCOPY  01/2014   diverticulosis, othwerise normal - Dr. Owens Loffler  . ESOPHAGOGASTRODUODENOSCOPY  2013   Dr. Benson Norway, gastritis  . ESOPHAGOGASTRODUODENOSCOPY  [id number]  . EUS N/A 04/16/2014   Procedure: UPPER ENDOSCOPIC ULTRASOUND (EUS) LINEAR;  Surgeon: Milus Banister, MD;  Location: [facility];  Service: Endoscopy;  Laterality: N/A;  . gall stone surgery    . KNEE ARTHROSCOPY Left   . LAPAROSCOPIC GASTRIC  RESECTION N/A 06/09/2014   Procedure: LAPAROSCOPIC GASTRIC MASS RESECTION;  Surgeon: [name], MD;  Location: [facility];  Service: General;  Laterality: N/A;  . LIPOMA EXCISION     forehead  . LUMBAR LAMINECTOMY N/A 04/25/2019   Procedure: LEFT L2-3 MICRODISCECTOMY, BILATERAL L5-S1 PARTIAL HEMILAMINECTOMY;  Surgeon: Jessy Oto, MD;  Location: [place];  Service: Orthopedics;  Laterality: N/A;  . UPPER GASTROINTESTINAL ENDOSCOPY    . UTERINE FIBROID SURGERY      There were no vitals filed for this visit.  Subjective Assessment - 07/09/19 0653    Subjective  Pt reports 0/10 pain currently. She reports her back will bother her more in the evening and at bedtime. Pt states she is taking 30 min walks per her cardiologist.    Currently in Pain?  No/denies                       [facility] Adult PT Treatment/Exercise - 07/09/19 0001      Lumbar Exercises: Stretches   Passive Hamstring Stretch  Right;Left;2 reps;30 seconds    Single Knee to Chest Stretch Limitations  x 10 ea. bilat. with leg on 55 cm P-ball    Piriformis Stretch  Right;Left;2 reps;30 seconds    Other Lumbar Stretch Exercise  supine (left) dural nerve floss  x 10 reps      Lumbar Exercises: Aerobic   Nustep  L5 x 7 min UE/LE      Lumbar Exercises: Standing   Heel Raises  20 reps    Heel Raises Limitations  on Airex    Functional Squats Limitations  see sit<>stand    Row  AROM;Strengthening;Right;Left;20 reps    Theraband Level (Row)  Level 4 (Blue)    Shoulder Extension  AROM;Strengthening;Both;20 reps    Theraband Level (Shoulder Extension)  Level 3 (Green)    Other Standing Lumbar Exercises  paff of press with blue Theraband 2x10 ea. way    Other Standing Lumbar Exercises  Rocker board side to side at counter with SBA x 1 min (dynamic balance), Airex marches x 20 with SBA      Lumbar Exercises: Seated   Sit to Stand  15 reps    Sit to Stand Limitations  from edge of high-low table raised to 24 in.       Lumbar Exercises: Supine   Clam  20 reps    Clam Limitations  red Theraband    Bent Knee Raise  15 reps    Bent Knee Raise Limitations  alt. opposite UE/LE raises    Bridge Limitations  partial bridge x 15 reps with legs on reversed blue incline wedge      Cryotherapy   Number Minutes Cryotherapy  10 Minutes    Cryotherapy Location  Lumbar Spine   supine with legs propped on cushion   Type of Cryotherapy  Ice pack             PT Education - 07/09/19 0657    Education Details  Recommended the use of a pillow between her knees when sleeping on her side and 1 to 2 pillows underher kness for slepping on her back.    Person(s) Educated  Patient    Methods  Explanation    Comprehension  Verbalized understanding       PT Short Term Goals - 07/02/19 0832      PT SHORT TERM GOAL #1   Title  Patient to be independent in initial HEP for flexibility and conditioning    Time  3    Period  Weeks    Status  New    Target Date  07/04/19      PT SHORT TERM GOAL #2   Title  Patient to ambulate with SPC and S x 200' without LOB and pain no greater than 3/10    Time  3    Period  Weeks    Status  On-going    Target Date  07/04/19      PT SHORT TERM GOAL #3   Title  Patient to demonstrate sit to stand without UE support x 5 reps and no LOB    Time  3    Period  Weeks    Status  On-going    Target Date  07/04/19        PT Long Term Goals - 07/03/19 1649      PT LONG TERM GOAL #1   Title  Independent with HEP    Baseline  met    Time  6    Period  Weeks    Status  Achieved      PT LONG TERM GOAL #2   Title  Improve FOTO outcome measure score to 45% or less impairment    Baseline  43%    Time  6  Period  Weeks    Status  On-going      PT LONG TERM GOAL #3   Title  Patient to demonstrate hip flexion strength at least 4-/5 for improved mobility and gait.    Baseline  right 4-/5, left 4/5    Time  6    Period  Weeks    Status  Achieved      PT LONG TERM GOAL #4    Title  Patient to demonstrate decreased fall risk with 5 times sit to stand in 15 seconds or less    Baseline  ongoing    Time  6    Period  Weeks    Status  On-going      PT LONG TERM GOAL #5   Title  Patient to ambulate 20 minutes without rest and pain no greater than 5/10.    Baseline  goal ongoing    Time  6    Period  Weeks    Status  On-going            Plan - 07/09/19 0701    Clinical Impression Statement  Pt particpated with PT session well. Pt continues to need a RW for support c ambulation. Positioning and use of pillows for support were recommended to assist with sleeping more comfortably. pt voiced understanding. Pt will continue to beenfit from PT to address core/LE ROM and strength and balance.    PT Treatment/Interventions  ADLs/Self Care Home Management;Cryotherapy;Electrical Stimulation;Iontophoresis 59m/ml Dexamethasone;Moist Heat;Ultrasound;Traction;Stair training;DME Instruction;Gait training;Functional mobility training;Therapeutic activities;Therapeutic exercise;Balance training;Patient/family education;Manual techniques;Passive range of motion;Scar mobilization;Taping;Spinal Manipulations;Joint Manipulations       Patient will benefit from skilled therapeutic intervention in order to improve the following deficits and impairments:  Abnormal gait, Decreased activity tolerance, Decreased balance, Decreased endurance, Decreased mobility, Decreased strength, Difficulty walking, Hypomobility, Impaired flexibility, Postural dysfunction, Pain, Impaired perceived functional ability  Visit Diagnosis: Difficulty in walking, not elsewhere classified  Muscle spasm of back  Muscle weakness (generalized)  Stiffness of left hip, not elsewhere classified  Stiffness of right hip, not elsewhere classified  Chronic bilateral low back pain with bilateral sciatica     Problem List Patient Active Problem List   Diagnosis Date Noted  . Lumbar disc herniation with  radiculopathy 04/25/2019    Class: Chronic  . Spinal stenosis of lumbar region 04/25/2019    Class: Chronic  . Status post lumbar laminectomy 04/25/2019  . Neck pain 11/12/2018  . Chronic pain of right knee 09/13/2018  . Bruising 09/13/2018  . SOB (shortness of breath) 08/30/2018  . Chronic pain of left knee 07/08/2018  . Chronic hip pain 07/08/2018  . Edema 07/08/2018  . Left foot pain 04/08/2018  . Olecranon bursitis of left elbow 04/05/2018  . Leg swelling 12/05/2017  . Need for shingles vaccine 12/05/2017  . Primary osteoarthritis of both hands 11/23/2017  . Rheumatoid factor positive 11/23/2017  . Primary osteoarthritis of both knees 11/23/2017  . Primary osteoarthritis of both feet 11/23/2017  . DDD (degenerative disc disease), lumbar 11/23/2017  . Vaccine counseling 08/13/2017  . Polyarthralgia 08/13/2017  . Joint stiffness 08/13/2017  . Sensitive skin 01/23/2017  . Need for influenza vaccination 01/23/2017  . Proteinuria 01/23/2017  . History of gastrointestinal stromal tumor (GIST) 04/20/2016  . History of TIA (transient ischemic attack) 04/20/2016  . Screening for breast cancer 04/20/2016  . Estrogen deficiency 04/20/2016  . Chronic maxillary sinusitis 04/20/2016  . Impaired fasting blood sugar 04/20/2016  . Constipation 04/20/2016  . History of  fall 04/20/2016  . Chronic radicular lumbar pain 01/27/2016  . Encounter for health maintenance examination in adult 03/22/2015  . Paresthesia 03/22/2015  . Cognitive decline 03/22/2015  . Screening for cervical cancer 03/22/2015  . Vitamin D deficiency 03/22/2015  . History of uterine leiomyoma 03/22/2015  . Gastroesophageal reflux disease without esophagitis 03/02/2014  . Chronic nausea 03/02/2014  . Chronic abdominal pain 03/02/2014  . Rhinitis, allergic 03/02/2014  . Essential hypertension 03/02/2014  . Hyperlipidemia 03/02/2014  . Obesity with serious comorbidity 01/16/2012    Gar Ponto MS, PT 07/09/19 7:08  AM   [place] [facility] [address] [place], [place], [address] Phone: [phone]   Fax:  [phone]  Name: Lisa Briggs MRN: [id number] Date of Birth: [birth year]

## 2019-07-10 ENCOUNTER — Encounter: Payer: Self-pay | Admitting: Physical Therapy

## 2019-07-10 ENCOUNTER — Ambulatory Visit (INDEPENDENT_AMBULATORY_CARE_PROVIDER_SITE_OTHER): Payer: BC Managed Care – PPO | Admitting: Specialist

## 2019-07-10 ENCOUNTER — Ambulatory Visit: Payer: BC Managed Care – PPO | Admitting: Physical Therapy

## 2019-07-10 ENCOUNTER — Encounter: Payer: Self-pay | Admitting: Specialist

## 2019-07-10 VITALS — BP 104/65 | HR 68 | Ht 69.0 in | Wt 230.0 lb

## 2019-07-10 DIAGNOSIS — M778 Other enthesopathies, not elsewhere classified: Secondary | ICD-10-CM

## 2019-07-10 DIAGNOSIS — M6281 Muscle weakness (generalized): Secondary | ICD-10-CM

## 2019-07-10 DIAGNOSIS — M25651 Stiffness of right hip, not elsewhere classified: Secondary | ICD-10-CM | POA: Diagnosis not present

## 2019-07-10 DIAGNOSIS — M25521 Pain in right elbow: Secondary | ICD-10-CM

## 2019-07-10 DIAGNOSIS — M5126 Other intervertebral disc displacement, lumbar region: Secondary | ICD-10-CM

## 2019-07-10 DIAGNOSIS — G8929 Other chronic pain: Secondary | ICD-10-CM | POA: Diagnosis not present

## 2019-07-10 DIAGNOSIS — M5441 Lumbago with sciatica, right side: Secondary | ICD-10-CM

## 2019-07-10 DIAGNOSIS — M25522 Pain in left elbow: Secondary | ICD-10-CM | POA: Diagnosis not present

## 2019-07-10 DIAGNOSIS — M25652 Stiffness of left hip, not elsewhere classified: Secondary | ICD-10-CM

## 2019-07-10 DIAGNOSIS — M4726 Other spondylosis with radiculopathy, lumbar region: Secondary | ICD-10-CM

## 2019-07-10 DIAGNOSIS — Z9889 Other specified postprocedural states: Secondary | ICD-10-CM

## 2019-07-10 DIAGNOSIS — M62830 Muscle spasm of back: Secondary | ICD-10-CM | POA: Diagnosis not present

## 2019-07-10 DIAGNOSIS — R600 Localized edema: Secondary | ICD-10-CM | POA: Diagnosis not present

## 2019-07-10 DIAGNOSIS — R262 Difficulty in walking, not elsewhere classified: Secondary | ICD-10-CM | POA: Diagnosis not present

## 2019-07-10 DIAGNOSIS — M5442 Lumbago with sciatica, left side: Secondary | ICD-10-CM | POA: Diagnosis not present

## 2019-07-10 MED ORDER — HYDROCODONE-ACETAMINOPHEN 5-325 MG PO TABS: 1.0000 | ORAL_TABLET | Freq: Four times a day (QID) | ORAL | 0 refills | Status: DC | PRN

## 2019-07-10 NOTE — Progress Notes (Signed)
Post-Op Visit Note   [name]           Date of Birth: 1960-08-17           MRN: [id number] Visit Date: 07/10/2019 PCP: Carlena Hurl, PA-C   Assessment & Plan: 2 months and 2 weeks post of left L2-3 microdiscectomy and bilateral lateral recess  Decompression L5-S1 for stenosis   Chief Complaint:  Chief Complaint  Patient presents with  . Lower Back - Follow-up  Complains of pain at night and is up at night with pain and difficulty moving. She reports she is having pain in the legs. No bowel or bladder difficulty. She has no pain with cough or sneeze. The right arm is painful with use of a cane or  Motor is normal  SLR is normal. She has pain with ROM and is using a walker with seat and brakes.   Visit Diagnoses: No diagnosis found.  Plan: Avoid frequent bending and stooping  No lifting greater than 10 lbs. May use ice or moist heat for pain. Weight loss is of benefit. Best medication for lumbar disc disease is arthritis medications like motrin, celebrex and naprosyn. Exercise is important to improve your indurance and does allow people to function better inspite of back pain.    Follow-Up Instructions: No follow-ups on file.   Orders:  No orders of the defined types were placed in this encounter.  No orders of the defined types were placed in this encounter.   Imaging: No results found.  PMFS History: Patient Active Problem List   Diagnosis Date Noted  . 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    Priority: High    Class: Chronic  . Spinal stenosis of lumbar region 04/25/2019    Priority: High    Class: Chronic  . Status post lumbar laminectomy 04/25/2019  . Neck pain 11/12/2018  . Chronic pain of right knee 09/13/2018  . Bruising 09/13/2018  . SOB (shortness of breath) 08/30/2018  . Chronic pain of left knee 07/08/2018  . Chronic hip pain 07/08/2018  . Edema 07/08/2018  . Left foot pain 04/08/2018  . Olecranon bursitis of  left elbow 04/05/2018  . Leg swelling 12/05/2017  . Need for shingles vaccine 12/05/2017  . Primary osteoarthritis of both hands 11/23/2017  . Rheumatoid factor positive 11/23/2017  . Primary osteoarthritis of both knees 11/23/2017  . Primary osteoarthritis of both feet 11/23/2017  . DDD (degenerative disc disease), lumbar 11/23/2017  . Vaccine counseling 08/13/2017  . Polyarthralgia 08/13/2017  . Joint stiffness 08/13/2017  . Sensitive skin 01/23/2017  . Need for influenza vaccination 01/23/2017  . Proteinuria 01/23/2017  . History of gastrointestinal stromal tumor (GIST) 04/20/2016  . History of TIA (transient ischemic attack) 04/20/2016  . Screening for breast cancer 04/20/2016  . Estrogen deficiency 04/20/2016  . Chronic maxillary sinusitis 04/20/2016  . Impaired fasting blood sugar 04/20/2016  . Constipation 04/20/2016  . History of fall 04/20/2016  . Chronic radicular lumbar pain 01/27/2016  . Encounter for health maintenance examination in adult 03/22/2015  . Paresthesia 03/22/2015  . Cognitive decline 03/22/2015  . Screening for cervical cancer 03/22/2015  . Vitamin D deficiency 03/22/2015  . History of uterine leiomyoma 03/22/2015  . Gastroesophageal reflux disease without esophagitis 03/02/2014  . Chronic nausea 03/02/2014  . Chronic abdominal pain 03/02/2014  . Rhinitis, allergic 03/02/2014  . Essential hypertension 03/02/2014  . Hyperlipidemia 03/02/2014  . Obesity with serious comorbidity 01/16/2012   Past Medical History:  Diagnosis Date  . Allergy   . Anemia    iron therapy for years as of 10/12; normal Hgb 08/2013  . Anxiety   . Arthritis   . Chest pain 04/05/2011   cardiac eval, normal treadmill stress test, Dr. Tollie Eth  . Chronic back pain   . Constipation   . Farsightedness    wears glasses, Eye care center  . 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 [name], [facility]  . GERD (gastroesophageal reflux  disease)   . History of uterine fibroid   . Hyperlipidemia   . Hypertension   . Paresthesia 09/2014   initially thought to be TIA, neurology consult in 12/2014 with other non TIA considerations.    . Polyarthralgia    normal rheumatoid screen 01/2012  . TIA (transient ischemic attack) 2015/16    Family History  Problem Relation Age of Onset  . Hypertension Mother   . Arthritis Mother   . GER disease Mother   . Glaucoma Mother   . Breast cancer Mother   . Stroke Father   . Breast cancer Sister        breast cancer dx late 68s  . Hypertension Sister   . Colon cancer Brother 35  . Diabetes Brother   . Diabetes Maternal Aunt   . Heart disease Maternal Aunt   . Heart disease Maternal Grandmother   . Heart disease Maternal Grandfather   . Heart disease Maternal Uncle   . Stroke Maternal Uncle   . Leukemia Sister   . Hypertension Sister   . Healthy Son   . Colon polyps Neg Hx   . Esophageal cancer Neg Hx   . Stomach cancer Neg Hx   . Rectal cancer Neg Hx     Past Surgical History:  Procedure Laterality Date  . COLONOSCOPY  01/2014   diverticulosis, othwerise normal - Dr. Owens Loffler  . ESOPHAGOGASTRODUODENOSCOPY  2013   Dr. Benson Norway, gastritis  . ESOPHAGOGASTRODUODENOSCOPY  [id number]  . EUS N/A 04/16/2014   Procedure: UPPER ENDOSCOPIC ULTRASOUND (EUS) LINEAR;  Surgeon: Milus Banister, MD;  Location: [facility];  Service: Endoscopy;  Laterality: N/A;  . gall stone surgery    . KNEE ARTHROSCOPY Left   . LAPAROSCOPIC GASTRIC RESECTION N/A 06/09/2014   Procedure: LAPAROSCOPIC GASTRIC MASS RESECTION;  Surgeon: [name], MD;  Location: [facility];  Service: General;  Laterality: N/A;  . LIPOMA EXCISION     forehead  . LUMBAR LAMINECTOMY N/A 04/25/2019   Procedure: LEFT L2-3 MICRODISCECTOMY, BILATERAL L5-S1 PARTIAL HEMILAMINECTOMY;  Surgeon: Jessy Oto, MD;  Location: [place];  Service: Orthopedics;  Laterality: N/A;  . UPPER GASTROINTESTINAL ENDOSCOPY    . UTERINE FIBROID  SURGERY     Social History   Occupational History  . Occupation: IT sales professional: [employer]  Tobacco Use  . Smoking status: Never Smoker  . Smokeless tobacco: Never Used  Substance and Sexual Activity  . Alcohol use: No    Alcohol/week: 0.0 standard drinks  . Drug use: No  . Sexual activity: Not on file

## 2019-07-10 NOTE — Therapy (Signed)
[place], [place], [address] Phone: [phone]   Fax:  [phone]  Physical Therapy Treatment/Re-evaluation  Patient Details  Name: Lisa Briggs MRN: [id number] Date of Birth: 09/15/1960 Referring Provider (PT): Basil Dess, MD   Encounter Date: 07/10/2019  PT End of Session - 07/10/19 1710    Visit Number  7    Number of Visits  19    Date for PT Re-Evaluation  08/21/19    Authorization Type  [employer]    PT Start Time  1612    PT Stop Time  1707    PT Time Calculation (min)  55 min    Activity Tolerance  Patient tolerated treatment well    Behavior During Therapy  [facility] for tasks assessed/performed       Past Medical History:  Diagnosis Date  . Allergy   . Anemia    iron therapy for years as of 10/12; normal Hgb 08/2013  . Anxiety   . Arthritis   . Chest pain 04/05/2011   cardiac eval, normal treadmill stress test, Dr. Tollie Eth  . Chronic back pain   . Constipation   . Farsightedness    wears glasses, [facility]  . 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 [name], [facility]  . GERD (gastroesophageal reflux disease)   . History of uterine fibroid   . Hyperlipidemia   . Hypertension   . Paresthesia 09/2014   initially thought to be TIA, neurology consult in 12/2014 with other non TIA considerations.    . Polyarthralgia    normal rheumatoid screen 01/2012  . TIA (transient ischemic attack) 2015/16    Past Surgical History:  Procedure Laterality Date  . COLONOSCOPY  01/2014   diverticulosis, othwerise normal - Dr. Owens Loffler  . ESOPHAGOGASTRODUODENOSCOPY  2013   Dr. Benson Norway, gastritis  . ESOPHAGOGASTRODUODENOSCOPY  [id number]  . EUS N/A 04/16/2014   Procedure: UPPER ENDOSCOPIC ULTRASOUND (EUS) LINEAR;  Surgeon: Milus Banister, MD;  Location: [facility];  Service: Endoscopy;  Laterality: N/A;  . gall stone surgery    . KNEE ARTHROSCOPY Left   .  LAPAROSCOPIC GASTRIC RESECTION N/A 06/09/2014   Procedure: LAPAROSCOPIC GASTRIC MASS RESECTION;  Surgeon: [name], MD;  Location: [facility];  Service: General;  Laterality: N/A;  . LIPOMA EXCISION     forehead  . LUMBAR LAMINECTOMY N/A 04/25/2019   Procedure: LEFT L2-3 MICRODISCECTOMY, BILATERAL L5-S1 PARTIAL HEMILAMINECTOMY;  Surgeon: Jessy Oto, MD;  Location: [place];  Service: Orthopedics;  Laterality: N/A;  . UPPER GASTROINTESTINAL ENDOSCOPY    . UTERINE FIBROID SURGERY      There were no vitals filed for this visit.  Subjective Assessment - 07/10/19 1635    Subjective  Pt. saw Dr. Louanne Skye earlier today for follow up and received new PT referral to add right elbow for therapy with diagnosis triceps tendinitis. She has had increased elbow pain with difficulty using right hand for cane use. She continues with 30 minute walks as noted last visit. No pain pre-tx. for elbow or back-pain more associated with activity and back pain worse in the evening/at night.    Pertinent History  history left elbow pain with olecranon bursitis, RA    Limitations  House hold activities;Lifting;Standing;Walking    Patient Stated Goals  Improve walking, get back to work    Currently in Pain?  No/denies         [facility] PT Assessment - 07/10/19 0001  Assessment   Medical Diagnosis  s/p lumbar laminectomy, right triceps tendonitis    Referring Provider (PT)  Basil Dess, MD    Onset Date/Surgical Date  04/25/19    Hand Dominance  Right      Observation/Other Assessments   Focus on Therapeutic Outcomes (FOTO)   63% limited      AROM   Overall AROM Comments  bilateral elbow AROM 0-135 deg flexion      Strength   Strength Assessment Site  Elbow    Right/Left Elbow  Right;Left    Right Elbow Flexion  5/5    Right Elbow Extension  4+/5    Left Elbow Flexion  4+/5    Left Elbow Extension  4/5    Right Hip Flexion  4-/5    Right Hip Extension  4-/5    Right Hip External Rotation   4/5    Right  Hip Internal Rotation  4/5    Right Hip ABduction  3-/5    Right Hip ADduction  4/5    Left Hip Flexion  4/5    Left Hip Extension  3-/5    Left Hip External Rotation  4/5    Left Hip Internal Rotation  4/5    Left Hip ABduction  3-/5    Left Hip ADduction  4/5    Right Knee Flexion  5/5    Right Knee Extension  5/5    Left Knee Flexion  4+/5    Left Knee Extension  4/5      Transfers   Five time sit to stand comments   63 seconds                   [facility] Adult PT Treatment/Exercise - 07/10/19 0001      Exercises   Exercises  Elbow      Elbow Exercises   Other elbow exercises  right tricep stretch 3x30 sec (manual stretch)      Lumbar Exercises: Stretches   Passive Hamstring Stretch  Right;Left;2 reps;30 seconds      Lumbar Exercises: Supine   Clam  20 reps    Clam Limitations  red Theraband    Bent Knee Raise  15 reps    Bent Knee Raise Limitations  alt. opposite UE/LE raises    Bridge Limitations  partial bridge x 15 reps with legs on reversed blue incline wedge      Modalities   Modalities  Iontophoresis;Ultrasound      Cryotherapy   Number Minutes Cryotherapy  10 Minutes    Cryotherapy Location  Lumbar Spine    Type of Cryotherapy  Ice pack      Ultrasound   Ultrasound Location  right distal tricep/tricep tendon    Ultrasound Parameters  3 MHZ 50% 1.0 W/cm2 x 8 min    Ultrasound Goals  Pain      Iontophoresis   Type of Iontophoresis  Dexamethasone    Location  right distal tricep    Dose  4 mg/mL, 1 mL    Time  --   4 hour take home patch            PT Education - 07/10/19 1706    Education Details  POC, ionto, HEP update for triceps stretch    Person(s) Educated  Patient    Methods  Explanation;Demonstration;Verbal cues;Handout    Comprehension  Verbalized understanding;Returned demonstration       PT Short Term Goals - 07/10/19 1930  PT SHORT TERM GOAL #1   Title  Patient to be independent in initial HEP for flexibility  and conditioning    Baseline  met    Time  3    Period  Weeks    Status  Achieved    Target Date  07/04/19      PT SHORT TERM GOAL #2   Title  Patient to ambulate with SPC and S x 200' without LOB and pain no greater than 3/10    Baseline  has demonstrated cane use but continues to use rollator-elbow pain has been limiting factor for cane use, continue goal    Time  3    Period  Weeks    Status  On-going    Target Date  07/31/19      PT SHORT TERM GOAL #3   Title  Patient to demonstrate sit to stand without UE support x 5 reps and no LOB    Baseline  stil with limitations:continue goal    Time  3    Period  Weeks    Status  On-going    Target Date  07/31/19        PT Long Term Goals - 07/10/19 1932      PT LONG TERM GOAL #1   Title  Independent with HEP    Baseline  met for initial HEP, updated today and will continue to update prn pending progress with therapy    Time  6    Period  Weeks    Status  Achieved      PT LONG TERM GOAL #2   Title  Improve FOTO outcome measure score to 45% or less impairment    Baseline  63% limited    Time  6    Period  Weeks    Status  On-going    Target Date  08/21/19      PT LONG TERM GOAL #3   Title  Patient to demonstrate hip flexion strength at least 4-/5 for improved mobility and gait.    Baseline  right 4-/5, left 4/5    Time  6    Period  Weeks    Status  Achieved      PT LONG TERM GOAL #4   Title  Patient to demonstrate decreased fall risk with 5 times sit to stand in 30 seconds or less    Baseline  63 seconds-previous goal of 15 seconds revised based on signficant time for sit<>stand    Time  6    Period  Weeks    Status  On-going    Target Date  08/21/19      PT LONG TERM GOAL #5   Title  Patient to ambulate 20 minutes without rest and pain no greater than 5/10.    Baseline  goal ongoing    Time  6    Period  Weeks    Status  On-going    Target Date  08/21/19      Additional Long Term Goals   Additional Long  Term Goals  Yes      PT LONG TERM GOAL #6   Title  Ambulate with SPC without limitation due to right elbow/triceps pain    Baseline  limited    Time  6    Period  Weeks    Status  New    Target Date  08/21/19      PT LONG TERM GOAL #7   Title  Increase right elbow extension  strength to 5/5 to improve ability for UE use to assist sit>stand and improve ability pushing/lifting activities    Baseline  4+/5    Time  6    Period  Weeks    Status  New    Target Date  08/21/19            Plan - 07/10/19 1712    Clinical Impression Statement  Elbow: Pt. presents with right elbow/distal tricep pain with associated muscle weakness and tightness consistent with referring dx. triceps tendinitis. Back: Pt. presents with continued mobility impairments 2 1/2 months s/p lumbar laminectomy with bilat. LE weakness and gait impairments. Contributing/complicating factors include medical comorbidities with DJD, RA with diffuse pain symptoms and associated inflammation, obesity and chronic pain history. Pt. would benefit from continued PT for further progress to improve functional status for mobility.    Personal Factors and Comorbidities  Fitness;Comorbidity 1;Comorbidity 2;Comorbidity 3+    Comorbidities  Obesity, HTN, TIA, DJD, GERD, allergies    Examination-Activity Limitations  Dressing;Lift;Locomotion Level;Sleep;Stand;Transfers    Examination-Participation Restrictions  Community Activity;Shop;Meal Prep;Cleaning    Stability/Clinical Decision Making  Evolving/Moderate complexity    Clinical Decision Making  Moderate    Rehab Potential  Fair    PT Frequency  2x / week    PT Duration  6 weeks    PT Treatment/Interventions  ADLs/Self Care Home Management;Cryotherapy;Electrical Stimulation;Iontophoresis 39m/ml Dexamethasone;Moist Heat;Ultrasound;Traction;Stair training;DME Instruction;Gait training;Functional mobility training;Therapeutic activities;Therapeutic exercise;Balance  training;Patient/family education;Manual techniques;Passive range of motion;Scar mobilization;Taping;Spinal Manipulations;Joint Manipulations;Dry needling    PT Next Visit Plan  Elbow: check response ionto and continue if found beneficial, continue [place]al triceps prn, for back continue exercise and functional activity progression as tolerated.    PT Home Exercise Plan  seated hamstring stretch, seated piriformis stretch, pelvic tilts, SKTC, hip adduction isometric, sit<>stand, triceps stretch    Consulted and Agree with Plan of Care  Patient       Patient will benefit from skilled therapeutic intervention in order to improve the following deficits and impairments:  Abnormal gait, Decreased activity tolerance, Decreased balance, Decreased endurance, Decreased mobility, Decreased strength, Difficulty walking, Hypomobility, Impaired flexibility, Postural dysfunction, Pain, Impaired perceived functional ability  Visit Diagnosis: Pain in right elbow  Chronic bilateral low back pain with bilateral sciatica  Difficulty in walking, not elsewhere classified  Muscle weakness (generalized)  Stiffness of left hip, not elsewhere classified  Stiffness of right hip, not elsewhere classified     Problem List Patient Active Problem List   Diagnosis Date Noted  . 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 Spinal stenosis of lumbar region 04/25/2019    Class: Chronic  . Status post lumbar laminectomy 04/25/2019  . Neck pain 11/12/2018  . Chronic pain of right knee 09/13/2018  . Bruising 09/13/2018  . SOB (shortness of breath) 08/30/2018  . Chronic pain of left knee 07/08/2018  . Chronic hip pain 07/08/2018  . Edema 07/08/2018  . Left foot pain 04/08/2018  . Olecranon bursitis of left elbow 04/05/2018  . Leg swelling 12/05/2017  . Need for shingles vaccine 12/05/2017  . Primary osteoarthritis of both hands 11/23/2017  . Rheumatoid factor positive 11/23/2017   . Primary osteoarthritis of both knees 11/23/2017  . Primary osteoarthritis of both feet 11/23/2017  . DDD (degenerative disc disease), lumbar 11/23/2017  . Vaccine counseling 08/13/2017  . Polyarthralgia 08/13/2017  . Joint stiffness 08/13/2017  . Sensitive skin 01/23/2017  . Need for influenza vaccination 01/23/2017  . Proteinuria  01/23/2017  . History of gastrointestinal stromal tumor (GIST) 04/20/2016  . History of TIA (transient ischemic attack) 04/20/2016  . Screening for breast cancer 04/20/2016  . Estrogen deficiency 04/20/2016  . Chronic maxillary sinusitis 04/20/2016  . Impaired fasting blood sugar 04/20/2016  . Constipation 04/20/2016  . History of fall 04/20/2016  . Chronic radicular lumbar pain 01/27/2016  . Encounter for health maintenance examination in adult 03/22/2015  . Paresthesia 03/22/2015  . Cognitive decline 03/22/2015  . Screening for cervical cancer 03/22/2015  . Vitamin D deficiency 03/22/2015  . History of uterine leiomyoma 03/22/2015  . Gastroesophageal reflux disease without esophagitis 03/02/2014  . Chronic nausea 03/02/2014  . Chronic abdominal pain 03/02/2014  . Rhinitis, allergic 03/02/2014  . Essential hypertension 03/02/2014  . Hyperlipidemia 03/02/2014  . Obesity with serious comorbidity 01/16/2012    Beaulah Dinning, PT, DPT 07/10/19 7:38 PM  [facility] [address] [place], [place], [address] Phone: [phone]   Fax:  [phone]  Name: Jaslen Adcox MRN: [id number] Date of Birth: 12-09-1960

## 2019-07-10 NOTE — Patient Instructions (Signed)
Plan: Avoid frequent bending and stooping  No lifting greater than 10 lbs. May use ice or moist heat for pain. Weight loss is of benefit. Best medication for lumbar disc disease is arthritis medications like motrin, celebrex and naprosyn. Exercise is important to improve your indurance and does allow people to function better inspite of back pain. PT for right elbow and lumbar spine. Terminal quadriceps exercises for the knee.

## 2019-07-14 ENCOUNTER — Telehealth: Payer: Self-pay

## 2019-07-14 NOTE — Telephone Encounter (Signed)
-----   Message from [employer] [place] sent at 07/12/2019  1:28 PM EDT ----- The nuclear stress test that was recently performed was essentially reported as being normal perfusion and low risk study.  There is no evidence of reversibility.  The other details of the report will be discussed at the next office visit.  If you continue to have symptoms that have increased in intensity, frequency, duration or new symptoms suggestive of typical chest pain as discussed during last office visit please still seek medical attention at the closest ER via EMS.

## 2019-07-14 NOTE — Telephone Encounter (Signed)
Lisa Briggs has got the pt. Scheduled for a diabetes check on 07/18/19.

## 2019-07-14 NOTE — Telephone Encounter (Signed)
Relayed information to patient. Patient voiced understanding.  

## 2019-07-15 ENCOUNTER — Encounter: Payer: Self-pay | Admitting: Medical

## 2019-07-15 ENCOUNTER — Other Ambulatory Visit: Payer: Self-pay

## 2019-07-15 ENCOUNTER — Ambulatory Visit: Payer: BC Managed Care – PPO

## 2019-07-15 DIAGNOSIS — M62830 Muscle spasm of back: Secondary | ICD-10-CM | POA: Diagnosis not present

## 2019-07-15 DIAGNOSIS — M25521 Pain in right elbow: Secondary | ICD-10-CM

## 2019-07-15 DIAGNOSIS — M25652 Stiffness of left hip, not elsewhere classified: Secondary | ICD-10-CM

## 2019-07-15 DIAGNOSIS — M6281 Muscle weakness (generalized): Secondary | ICD-10-CM | POA: Diagnosis not present

## 2019-07-15 DIAGNOSIS — G8929 Other chronic pain: Secondary | ICD-10-CM | POA: Diagnosis not present

## 2019-07-15 DIAGNOSIS — M5442 Lumbago with sciatica, left side: Secondary | ICD-10-CM

## 2019-07-15 DIAGNOSIS — R600 Localized edema: Secondary | ICD-10-CM

## 2019-07-15 DIAGNOSIS — R262 Difficulty in walking, not elsewhere classified: Secondary | ICD-10-CM

## 2019-07-15 DIAGNOSIS — M25522 Pain in left elbow: Secondary | ICD-10-CM

## 2019-07-15 DIAGNOSIS — M25651 Stiffness of right hip, not elsewhere classified: Secondary | ICD-10-CM

## 2019-07-15 DIAGNOSIS — M5441 Lumbago with sciatica, right side: Secondary | ICD-10-CM | POA: Diagnosis not present

## 2019-07-15 NOTE — Therapy (Signed)
[place], [place], [address] Phone: [phone]   Fax:  [phone]  Physical Therapy Treatment  Patient Details  Name: Lisa Briggs MRN: [id number] Date of Birth: 13-Dec-1960 Referring Provider (PT): Basil Dess, MD   Encounter Date: 07/15/2019  PT End of Session - 07/15/19 1737    Visit Number  8    Number of Visits  19    Date for PT Re-Evaluation  08/21/19    Authorization Type  [employer]    PT Start Time  1618    PT Stop Time  1725    PT Time Calculation (min)  67 min    Activity Tolerance  Patient tolerated treatment well    Behavior During Therapy  [facility] for tasks assessed/performed       Past Medical History:  Diagnosis Date  . Allergy   . Anemia    iron therapy for years as of 10/12; normal Hgb 08/2013  . Anxiety   . Arthritis   . Chest pain 04/05/2011   cardiac eval, normal treadmill stress test, Dr. Tollie Eth  . Chronic back pain   . Constipation   . Farsightedness    wears glasses, [facility]  . 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 [name], [facility]  . GERD (gastroesophageal reflux disease)   . History of uterine fibroid   . Hyperlipidemia   . Hypertension   . Paresthesia 09/2014   initially thought to be TIA, neurology consult in 12/2014 with other non TIA considerations.    . Polyarthralgia    normal rheumatoid screen 01/2012  . TIA (transient ischemic attack) 2015/16    Past Surgical History:  Procedure Laterality Date  . COLONOSCOPY  01/2014   diverticulosis, othwerise normal - Dr. Owens Loffler  . ESOPHAGOGASTRODUODENOSCOPY  2013   Dr. Benson Norway, gastritis  . ESOPHAGOGASTRODUODENOSCOPY  [id number]  . EUS N/A 04/16/2014   Procedure: UPPER ENDOSCOPIC ULTRASOUND (EUS) LINEAR;  Surgeon: Milus Banister, MD;  Location: [facility];  Service: Endoscopy;  Laterality: N/A;  . gall stone surgery    . KNEE ARTHROSCOPY Left   . LAPAROSCOPIC  GASTRIC RESECTION N/A 06/09/2014   Procedure: LAPAROSCOPIC GASTRIC MASS RESECTION;  Surgeon: [name], MD;  Location: [facility];  Service: General;  Laterality: N/A;  . LIPOMA EXCISION     forehead  . LUMBAR LAMINECTOMY N/A 04/25/2019   Procedure: LEFT L2-3 MICRODISCECTOMY, BILATERAL L5-S1 PARTIAL HEMILAMINECTOMY;  Surgeon: Jessy Oto, MD;  Location: [place];  Service: Orthopedics;  Laterality: N/A;  . UPPER GASTROINTESTINAL ENDOSCOPY    . UTERINE FIBROID SURGERY      There were no vitals filed for this visit.  Subjective Assessment - 07/15/19 1734    Subjective  Pt reports she is doing well. Pt rated her L elbow painas a 3/10/                       [facility] Adult PT Treatment/Exercise - 07/15/19 0001      Lumbar Exercises: Stretches   Passive Hamstring Stretch  Right;Left;2 reps;30 seconds    Single Knee to Chest Stretch Limitations  x 10 ea. bilat. with leg on 55 cm P-ball    Lower Trunk Rotation Limitations  10 reps    Piriformis Stretch  Right;Left;2 reps;30 seconds      Lumbar Exercises: Seated   Sit to Stand  15 reps    Sit to Stand Limitations  from  edge of high-low table raised to 24 in.      Lumbar Exercises: Supine   Clam  20 reps    Clam Limitations  red Theraband    Bent Knee Raise  15 reps    Bent Knee Raise Limitations  alt. opposite UE/LE raises    Bridge Limitations  partial bridge x 15 reps with legs on reversed blue incline wedge    Other Supine Lumbar Exercises  Hooklying add sets c ball, 15x, 3 sec      Cryotherapy   Number Minutes Cryotherapy  15 Minutes    Cryotherapy Location  Lumbar Spine    Type of Cryotherapy  Ice pack      Manual Therapy   Manual Therapy  Soft tissue mobilization    Manual therapy comments  XFM to the R distal triceps tendon x  8 mins             PT Education - 07/15/19 1750    Education Details  Instructed pt in XFM to the L distal triceps tendon. Recommended  pt. completing the XFM 3x daily for 5 mins  to decrease inflamation and promote healing..       PT Short Term Goals - 07/10/19 1930      PT SHORT TERM GOAL #1   Title  Patient to be independent in initial HEP for flexibility and conditioning    Baseline  met    Time  3    Period  Weeks    Status  Achieved    Target Date  07/04/19      PT SHORT TERM GOAL #2   Title  Patient to ambulate with SPC and S x 200' without LOB and pain no greater than 3/10    Baseline  has demonstrated cane use but continues to use rollator-elbow pain has been limiting factor for cane use, continue goal    Time  3    Period  Weeks    Status  On-going    Target Date  07/31/19      PT SHORT TERM GOAL #3   Title  Patient to demonstrate sit to stand without UE support x 5 reps and no LOB    Baseline  stil with limitations:continue goal    Time  3    Period  Weeks    Status  On-going    Target Date  07/31/19        PT Long Term Goals - 07/10/19 1932      PT LONG TERM GOAL #1   Title  Independent with HEP    Baseline  met for initial HEP, updated today and will continue to update prn pending progress with therapy    Time  6    Period  Weeks    Status  Achieved      PT LONG TERM GOAL #2   Title  Improve FOTO outcome measure score to 45% or less impairment    Baseline  63% limited    Time  6    Period  Weeks    Status  On-going    Target Date  08/21/19      PT LONG TERM GOAL #3   Title  Patient to demonstrate hip flexion strength at least 4-/5 for improved mobility and gait.    Baseline  right 4-/5, left 4/5    Time  6    Period  Weeks    Status  Achieved      PT LONG  TERM GOAL #4   Title  Patient to demonstrate decreased fall risk with 5 times sit to stand in 30 seconds or less    Baseline  63 seconds-previous goal of 15 seconds revised based on signficant time for sit<>stand    Time  6    Period  Weeks    Status  On-going    Target Date  08/21/19      PT LONG TERM GOAL #5   Title  Patient to ambulate 20 minutes without rest  and pain no greater than 5/10.    Baseline  goal ongoing    Time  6    Period  Weeks    Status  On-going    Target Date  08/21/19      Additional Long Term Goals   Additional Long Term Goals  Yes      PT LONG TERM GOAL #6   Title  Ambulate with SPC without limitation due to right elbow/triceps pain    Baseline  limited    Time  6    Period  Weeks    Status  New    Target Date  08/21/19      PT LONG TERM GOAL #7   Title  Increase right elbow extension strength to 5/5 to improve ability for UE use to assist sit>stand and improve ability pushing/lifting activities    Baseline  4+/5    Time  6    Period  Weeks    Status  New    Target Date  08/21/19            Plan - 07/15/19 1743    Clinical Impression Statement  Provided and instructed pt in XFM to the L distal triceps tendon. Recommended  pt. completing the XFM 3x daily for 5 mins. Pt participated in post laminectomy rehab well. Pt will benefit from continued PT to address flexibility, strength, balance and function.    PT Treatment/Interventions  ADLs/Self Care Home Management;Cryotherapy;Electrical Stimulation;Iontophoresis 42m/ml Dexamethasone;Moist Heat;Ultrasound;Traction;Stair training;DME Instruction;Gait training;Functional mobility training;Therapeutic activities;Therapeutic exercise;Balance training;Patient/family education;Manual techniques;Passive range of motion;Scar mobilization;Taping;Spinal Manipulations;Joint Manipulations;Dry needling    PT Next Visit Plan  Assess response to XFM       Patient will benefit from skilled therapeutic intervention in order to improve the following deficits and impairments:  Abnormal gait, Decreased activity tolerance, Decreased balance, Decreased endurance, Decreased mobility, Decreased strength, Difficulty walking, Hypomobility, Impaired flexibility, Postural dysfunction, Pain, Impaired perceived functional ability  Visit Diagnosis: Pain in right elbow  Chronic bilateral low  back pain with bilateral sciatica  Difficulty in walking, not elsewhere classified  Muscle weakness (generalized)  Stiffness of left hip, not elsewhere classified  Stiffness of right hip, not elsewhere classified  Muscle spasm of back  Pain in left elbow  Localized edema     Problem List Patient Active Problem List   Diagnosis Date Noted  . 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 Spinal stenosis of lumbar region 04/25/2019    Class: Chronic  . Status post lumbar laminectomy 04/25/2019  . Neck pain 11/12/2018  . Chronic pain of right knee 09/13/2018  . Bruising 09/13/2018  . SOB (shortness of breath) 08/30/2018  . Chronic pain of left knee 07/08/2018  . Chronic hip pain 07/08/2018  . Edema 07/08/2018  . Left foot pain 04/08/2018  . Olecranon bursitis of left elbow 04/05/2018  . Leg swelling 12/05/2017  . Need for shingles vaccine 12/05/2017  . Primary osteoarthritis of both  hands 11/23/2017  . Rheumatoid factor positive 11/23/2017  . Primary osteoarthritis of both knees 11/23/2017  . Primary osteoarthritis of both feet 11/23/2017  . DDD (degenerative disc disease), lumbar 11/23/2017  . Vaccine counseling 08/13/2017  . Polyarthralgia 08/13/2017  . Joint stiffness 08/13/2017  . Sensitive skin 01/23/2017  . Need for influenza vaccination 01/23/2017  . Proteinuria 01/23/2017  . History of gastrointestinal stromal tumor (GIST) 04/20/2016  . History of TIA (transient ischemic attack) 04/20/2016  . Screening for breast cancer 04/20/2016  . Estrogen deficiency 04/20/2016  . Chronic maxillary sinusitis 04/20/2016  . Impaired fasting blood sugar 04/20/2016  . Constipation 04/20/2016  . History of fall 04/20/2016  . Chronic radicular lumbar pain 01/27/2016  . Encounter for health maintenance examination in adult 03/22/2015  . Paresthesia 03/22/2015  . Cognitive decline 03/22/2015  . Screening for cervical cancer 03/22/2015  . Vitamin  D deficiency 03/22/2015  . History of uterine leiomyoma 03/22/2015  . Gastroesophageal reflux disease without esophagitis 03/02/2014  . Chronic nausea 03/02/2014  . Chronic abdominal pain 03/02/2014  . Rhinitis, allergic 03/02/2014  . Essential hypertension 03/02/2014  . Hyperlipidemia 03/02/2014  . Obesity with serious comorbidity 01/16/2012   Lisa Ponto MS, PT 07/15/19 6:00 PM  [facility] [address] [place], [place], [address] Phone: [phone]   Fax:  [phone]  Name: Avaleigh Decuir MRN: [id number] Date of Birth: [birth year]

## 2019-07-17 ENCOUNTER — Ambulatory Visit: Payer: BC Managed Care – PPO

## 2019-07-17 ENCOUNTER — Other Ambulatory Visit: Payer: Self-pay

## 2019-07-17 DIAGNOSIS — M25521 Pain in right elbow: Secondary | ICD-10-CM | POA: Diagnosis not present

## 2019-07-17 DIAGNOSIS — R262 Difficulty in walking, not elsewhere classified: Secondary | ICD-10-CM

## 2019-07-17 DIAGNOSIS — M6281 Muscle weakness (generalized): Secondary | ICD-10-CM | POA: Diagnosis not present

## 2019-07-17 DIAGNOSIS — M62830 Muscle spasm of back: Secondary | ICD-10-CM

## 2019-07-17 DIAGNOSIS — G8929 Other chronic pain: Secondary | ICD-10-CM

## 2019-07-17 DIAGNOSIS — M25652 Stiffness of left hip, not elsewhere classified: Secondary | ICD-10-CM | POA: Diagnosis not present

## 2019-07-17 DIAGNOSIS — M25651 Stiffness of right hip, not elsewhere classified: Secondary | ICD-10-CM

## 2019-07-17 DIAGNOSIS — R600 Localized edema: Secondary | ICD-10-CM

## 2019-07-17 DIAGNOSIS — M25522 Pain in left elbow: Secondary | ICD-10-CM | POA: Diagnosis not present

## 2019-07-17 DIAGNOSIS — M5442 Lumbago with sciatica, left side: Secondary | ICD-10-CM

## 2019-07-17 DIAGNOSIS — M5441 Lumbago with sciatica, right side: Secondary | ICD-10-CM | POA: Diagnosis not present

## 2019-07-18 ENCOUNTER — Ambulatory Visit (INDEPENDENT_AMBULATORY_CARE_PROVIDER_SITE_OTHER): Payer: BC Managed Care – PPO | Admitting: Medical

## 2019-07-18 ENCOUNTER — Encounter: Payer: Self-pay | Admitting: Medical

## 2019-07-18 VITALS — BP 120/70 | HR 68 | Temp 97.8°F | Wt 252.8 lb

## 2019-07-18 DIAGNOSIS — M5416 Radiculopathy, lumbar region: Secondary | ICD-10-CM

## 2019-07-18 DIAGNOSIS — M19041 Primary osteoarthritis, right hand: Secondary | ICD-10-CM

## 2019-07-18 DIAGNOSIS — Z131 Encounter for screening for diabetes mellitus: Secondary | ICD-10-CM | POA: Diagnosis not present

## 2019-07-18 DIAGNOSIS — R7301 Impaired fasting glucose: Secondary | ICD-10-CM | POA: Diagnosis not present

## 2019-07-18 DIAGNOSIS — M19071 Primary osteoarthritis, right ankle and foot: Secondary | ICD-10-CM

## 2019-07-18 DIAGNOSIS — R202 Paresthesia of skin: Secondary | ICD-10-CM

## 2019-07-18 DIAGNOSIS — M25561 Pain in right knee: Secondary | ICD-10-CM

## 2019-07-18 DIAGNOSIS — Z8673 Personal history of transient ischemic attack (TIA), and cerebral infarction without residual deficits: Secondary | ICD-10-CM

## 2019-07-18 DIAGNOSIS — I10 Essential (primary) hypertension: Secondary | ICD-10-CM | POA: Diagnosis not present

## 2019-07-18 DIAGNOSIS — R109 Unspecified abdominal pain: Secondary | ICD-10-CM

## 2019-07-18 DIAGNOSIS — E669 Obesity, unspecified: Secondary | ICD-10-CM

## 2019-07-18 DIAGNOSIS — R768 Other specified abnormal immunological findings in serum: Secondary | ICD-10-CM

## 2019-07-18 DIAGNOSIS — M5136 Other intervertebral disc degeneration, lumbar region: Secondary | ICD-10-CM

## 2019-07-18 DIAGNOSIS — E7800 Pure hypercholesterolemia, unspecified: Secondary | ICD-10-CM

## 2019-07-18 DIAGNOSIS — G8929 Other chronic pain: Secondary | ICD-10-CM

## 2019-07-18 DIAGNOSIS — M5116 Intervertebral disc disorders with radiculopathy, lumbar region: Secondary | ICD-10-CM

## 2019-07-18 DIAGNOSIS — E559 Vitamin D deficiency, unspecified: Secondary | ICD-10-CM

## 2019-07-18 DIAGNOSIS — M19042 Primary osteoarthritis, left hand: Secondary | ICD-10-CM

## 2019-07-18 DIAGNOSIS — R110 Nausea: Secondary | ICD-10-CM

## 2019-07-18 DIAGNOSIS — M170 Bilateral primary osteoarthritis of knee: Secondary | ICD-10-CM

## 2019-07-18 DIAGNOSIS — M19072 Primary osteoarthritis, left ankle and foot: Secondary | ICD-10-CM

## 2019-07-18 LAB — LIPID PANEL
Chol/HDL Ratio: 2.1 ratio (ref 0.0–4.4)
Cholesterol, Total: 162 mg/dL (ref 100–199)
HDL: 77 mg/dL (ref >39)
LDL Chol Calc (NIH): 71 mg/dL (ref 0–99)
Triglycerides: 70 mg/dL (ref 0–149)
VLDL Cholesterol Cal: 14 mg/dL (ref 5–40)

## 2019-07-18 LAB — POCT URINALYSIS DIP (PROADVANTAGE DEVICE)
Blood, UA: NEGATIVE
Glucose, UA: NEGATIVE mg/dL
Ketones, POC UA: NEGATIVE mg/dL
Leukocytes, UA: NEGATIVE
Nitrite, UA: NEGATIVE
Specific Gravity, Urine: 1.03
Urobilinogen, Ur: 0.2
pH, UA: 6 (ref 5.0–8.0)

## 2019-07-18 LAB — COMPREHENSIVE METABOLIC PANEL
ALT: 18 IU/L (ref 0–32)
AST: 19 IU/L (ref 0–40)
Albumin/Globulin Ratio: 1.7 (ref 1.2–2.2)
Albumin: 4.4 g/dL (ref 3.8–4.9)
Alkaline Phosphatase: 130 IU/L — ABNORMAL HIGH (ref 39–117)
BUN/Creatinine Ratio: 22 (ref 9–23)
BUN: 14 mg/dL (ref 6–24)
Bilirubin Total: <0.2 mg/dL (ref 0.0–1.2)
CO2: 27 mmol/L (ref 20–29)
Calcium: 9.8 mg/dL (ref 8.7–10.2)
Chloride: 103 mmol/L (ref 96–106)
Creatinine, Ser: 0.65 mg/dL (ref 0.57–1.00)
GFR calc Af Amer: 114 mL/min/{1.73_m2} (ref >59)
GFR calc non Af Amer: 99 mL/min/{1.73_m2} (ref >59)
Globulin, Total: 2.6 g/dL (ref 1.5–4.5)
Glucose: 86 mg/dL (ref 65–99)
Potassium: 4.2 mmol/L (ref 3.5–5.2)
Sodium: 143 mmol/L (ref 134–144)
Total Protein: 7 g/dL (ref 6.0–8.5)

## 2019-07-18 LAB — POCT GLYCOSYLATED HEMOGLOBIN (HGB A1C): Hemoglobin A1C: 5.9 % — AB (ref 4.0–5.6)

## 2019-07-18 NOTE — Progress Notes (Signed)
Subjective:  Purpose Lisa Briggs is a 59 y.o. female who presents for Chief Complaint  Patient presents with  . Diabetes    diabetes check, leg cramps      Here for follow-up on medications, several concerns and here for fasting screening for cholesterol and diabetes per cardiology request  She has concerns about losartan.  Was reading side effects on label, thinks she is having the side effects listed including hurting in her legs, pins and needles in her legs, light flashing across eyes, cramping in the legs.  The light flashing symptoms is occasionally.  Sees eye doctor soon to discuss flashing symptom.  Has chronic back pain.  Had back surgery 04/2019, but still having pains in legs, particular lower legs, pins and needles feeling.  Uses compression socks both legs.   Has some ongoing pains in left arm.  Left arm feels swollen under triceps and elbow area.  Hurts in same area.   Has chronic pains in left knee and leg in general.   Fasting today for labs.   She notes she drinks a lot of water, does have some frequency of urine.  Mouth is dry at times.   No weight change recently.  Just had steroid shot recently thorough urgent care for skin reaction from cosmetics.   She knows that steroid shots can increase blood sugars.    She does note compliance with her blood pressure medicines including carvedilol that was started in March by cardiology  She has chronic nausea, chronic abdominal pain, GERD.  Lately has not been having much problems.  She does take Dexilant and Pepcid daily.  She takes Linzess occasionally as needed but not daily.  She her major significant issues in recent months has been her joint pains, back pains, leg pains, arm knee pains.  Sees rheumatologist and orthopedist.  She had back surgery in January.  Using a rolling walker of late.    Walks 30 minutes daily for exercise.  Stretches daily.   Hasn't worked since 12/2018.   No other aggravating or relieving factors.    No other  c/o.  The following portions of the patient's history were reviewed and updated as appropriate: allergies, current medications, past family history, past medical history, past social history, past surgical history and problem list.  ROS Otherwise as in subjective above    Objective: BP 120/70   Pulse 68   Temp 97.8 F (36.6 C)   Wt 252 lb 12.8 oz (114.7 kg)   LMP 02/23/2015 (LMP Unknown)   BMI 37.33 kg/m    BP Readings from Last 3 Encounters:  07/18/19 120/70  07/10/19 104/65  07/07/19 118/76   Wt Readings from Last 3 Encounters:  07/18/19 252 lb 12.8 oz (114.7 kg)  07/10/19 230 lb (104.3 kg)  07/07/19 254 lb 12.8 oz (115.6 kg)    General appearance: alert, no distress, well developed, well nourished, obese African-American female, seated with rolling walker Neck: supple, no lymphadenopathy, no thyromegaly, no masses, no bruits Heart: RRR, normal S1, S2, no murmurs Lungs: CTA bilaterally, no wheezes, rhonchi, or rales Pulses: 2+ radial pulses, 2+ pedal pulses, normal cap refill She has a compression hose on the left lower leg from the knee down, no compression hose on the right, no obvious edema of arms or legs today No particular tenderness of her left arm with palpitation, no swelling, no obvious deformity, wrist and elbow normal range of motion, she notes some discomfort when palpating her left lower leg around the  anterior lower leg but otherwise nontender    Assessment: Encounter Diagnoses  Name Primary?  . Impaired fasting blood sugar Yes  . Screening for diabetes mellitus   . Essential hypertension   . Pure hypercholesterolemia   . Chronic nausea   . Chronic abdominal pain   . Vitamin D deficiency   . Lumbar disc herniation with radiculopathy   . Primary osteoarthritis of both knees   . Primary osteoarthritis of both hands   . Primary osteoarthritis of both feet   . DDD (degenerative disc disease), lumbar   . Class 1 obesity with serious comorbidity in  adult, unspecified BMI, unspecified obesity type   . Paresthesia   . History of TIA (transient ischemic attack)   . Chronic radicular lumbar pain   . Rheumatoid factor positive   . Chronic pain of right knee      Plan: I reviewed her cardiology notes from March 2021.  I reviewed her recent orthopedist  She has been borderline for diabetes in the past.  Screening labs today.  Counseled on diet, exercise.  She declined nutritionist consult last visit does not seem to be motivated to do this today  She reports compliance with simvastatin.  Updated lipid fasting today  No recent complaints with chronic nausea or abdominal pain.  She continues acid reflux medicine daily use Linzess as needed  Iron deficiency-she continues to supplement  Hypertension-blood pressures look good today.  We discussed that it is hard to know if her symptoms are related to losartan or not.  I suspect they are not.  She is agreeable to continue her blood pressure medicines for the time being unless the potassium is way off.  Labs today  Chronic back pain, chronic pains, tendinitis of knee, status post lumbar back surgery-follow-up with rheumatoid and orthopedics as planned   Lisa Briggs was seen today for diabetes.  Diagnoses and all orders for this visit:  Impaired fasting blood sugar -     Cancel: Hemoglobin A1c -     Comprehensive metabolic panel -     POCT Urinalysis DIP (Proadvantage Device) -     HgB A1c  Screening for diabetes mellitus -     POCT Urinalysis DIP (Proadvantage Device) -     HgB A1c  Essential hypertension -     Comprehensive metabolic panel  Pure hypercholesterolemia -     Lipid panel -     Comprehensive metabolic panel  Chronic nausea  Chronic abdominal pain  Vitamin D deficiency  Lumbar disc herniation with radiculopathy  Primary osteoarthritis of both knees  Primary osteoarthritis of both hands  Primary osteoarthritis of both feet  DDD (degenerative disc disease),  lumbar  Class 1 obesity with serious comorbidity in adult, unspecified BMI, unspecified obesity type  Paresthesia  History of TIA (transient ischemic attack)  Chronic radicular lumbar pain  Rheumatoid factor positive  Chronic pain of right knee    Follow up: Pending labs

## 2019-07-18 NOTE — Therapy (Signed)
[place], [place], [address] Phone: [phone]   Fax:  [phone]  Physical Therapy Treatment  Patient Details  Name: Lisa Briggs MRN: [id number] Date of Birth: [birth year] Referring Provider (PT): Basil Dess, MD   Encounter Date: 07/17/2019  PT End of Session - 07/18/19 0545    Visit Number  9    Number of Visits  19    Date for PT Re-Evaluation  08/21/19    Authorization Type  BCBS    PT Start Time  1624    PT Stop Time  1710    PT Time Calculation (min)  46 min    Activity Tolerance  Patient tolerated treatment well    Behavior During Therapy  [facility] for tasks assessed/performed       Past Medical History:  Diagnosis Date  . Allergy   . Anemia    iron therapy for years as of 10/12; normal Hgb 08/2013  . Anxiety   . Arthritis   . Chest pain 04/05/2011   cardiac eval, normal treadmill stress test, Dr. Tollie Eth  . Chronic back pain   . Constipation   . Farsightedness    wears glasses, [facility]  . 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 [name], [facility]  . GERD (gastroesophageal reflux disease)   . History of uterine fibroid   . Hyperlipidemia   . Hypertension   . Paresthesia 09/2014   initially thought to be TIA, neurology consult in 12/2014 with other non TIA considerations.    . Polyarthralgia    normal rheumatoid screen 01/2012  . TIA (transient ischemic attack) 2015/16    Past Surgical History:  Procedure Laterality Date  . COLONOSCOPY  01/2014   diverticulosis, othwerise normal - Dr. Owens Loffler  . ESOPHAGOGASTRODUODENOSCOPY  2013   Dr. Benson Norway, gastritis  . ESOPHAGOGASTRODUODENOSCOPY  [id number]  . EUS N/A 04/16/2014   Procedure: UPPER ENDOSCOPIC ULTRASOUND (EUS) LINEAR;  Surgeon: Milus Banister, MD;  Location: [facility];  Service: Endoscopy;  Laterality: N/A;  . gall stone surgery    . KNEE ARTHROSCOPY Left   . LAPAROSCOPIC  GASTRIC RESECTION N/A 06/09/2014   Procedure: LAPAROSCOPIC GASTRIC MASS RESECTION;  Surgeon: [name], MD;  Location: [facility];  Service: General;  Laterality: N/A;  . LIPOMA EXCISION     forehead  . LUMBAR LAMINECTOMY N/A 04/25/2019   Procedure: LEFT L2-3 MICRODISCECTOMY, BILATERAL L5-S1 PARTIAL HEMILAMINECTOMY;  Surgeon: Jessy Oto, MD;  Location: [place];  Service: Orthopedics;  Laterality: N/A;  . UPPER GASTROINTESTINAL ENDOSCOPY    . UTERINE FIBROID SURGERY      There were no vitals filed for this visit.  Subjective Assessment - 07/17/19 1634    Subjective  Pt reports grocery shopping prior to comimg to PT today. Pt rates her low back pain as a 6/10. Pt reports she has massaging her R elbow area some.    Currently in Pain?  Yes    Pain Score  6     Pain Location  Back    Pain Orientation  Lower    Pain Descriptors / Indicators  Aching;Sharp    Pain Type  Chronic pain    Pain Onset  More than a month ago    Aggravating Factors   wrose at night, walking    Pain Relieving Factors  ice    Effect of Pain on Daily Activities  Limits activity by taking more time and rest  breaks                       [facility] Adult PT Treatment/Exercise - 07/18/19 0001      Lumbar Exercises: Aerobic   Nustep  L5 x 6 min UE/LE      Lumbar Exercises: Standing   Heel Raises  10 reps    Heel Raises Limitations  on Airex      Cryotherapy   Number Minutes Cryotherapy  15 Minutes    Cryotherapy Location  Lumbar Spine    Type of Cryotherapy  Ice pack      Iontophoresis   Type of Iontophoresis  Dexamethasone    Location  R post./distal R elboe    Dose  4 mg/mL, 1 mL    Time  ^ hour take home patch             PT Education - 07/18/19 0541    Education Details  To wear the iontophoresis patch for 6 hours and to keep the patch dry.    Person(s) Educated  Patient    Methods  Explanation    Comprehension  Verbalized understanding       PT Short Term Goals - 07/10/19 1930       PT SHORT TERM GOAL #1   Title  Patient to be independent in initial HEP for flexibility and conditioning    Baseline  met    Time  3    Period  Weeks    Status  Achieved    Target Date  07/04/19      PT SHORT TERM GOAL #2   Title  Patient to ambulate with SPC and S x 200' without LOB and pain no greater than 3/10    Baseline  has demonstrated cane use but continues to use rollator-elbow pain has been limiting factor for cane use, continue goal    Time  3    Period  Weeks    Status  On-going    Target Date  07/31/19      PT SHORT TERM GOAL #3   Title  Patient to demonstrate sit to stand without UE support x 5 reps and no LOB    Baseline  stil with limitations:continue goal    Time  3    Period  Weeks    Status  On-going    Target Date  07/31/19        PT Long Term Goals - 07/10/19 1932      PT LONG TERM GOAL #1   Title  Independent with HEP    Baseline  met for initial HEP, updated today and will continue to update prn pending progress with therapy    Time  6    Period  Weeks    Status  Achieved      PT LONG TERM GOAL #2   Title  Improve FOTO outcome measure score to 45% or less impairment    Baseline  63% limited    Time  6    Period  Weeks    Status  On-going    Target Date  08/21/19      PT LONG TERM GOAL #3   Title  Patient to demonstrate hip flexion strength at least 4-/5 for improved mobility and gait.    Baseline  right 4-/5, left 4/5    Time  6    Period  Weeks    Status  Achieved      PT  LONG TERM GOAL #4   Title  Patient to demonstrate decreased fall risk with 5 times sit to stand in 30 seconds or less    Baseline  63 seconds-previous goal of 15 seconds revised based on signficant time for sit<>stand    Time  6    Period  Weeks    Status  On-going    Target Date  08/21/19      PT LONG TERM GOAL #5   Title  Patient to ambulate 20 minutes without rest and pain no greater than 5/10.    Baseline  goal ongoing    Time  6    Period  Weeks     Status  On-going    Target Date  08/21/19      Additional Long Term Goals   Additional Long Term Goals  Yes      PT LONG TERM GOAL #6   Title  Ambulate with SPC without limitation due to right elbow/triceps pain    Baseline  limited    Time  6    Period  Weeks    Status  New    Target Date  08/21/19      PT LONG TERM GOAL #7   Title  Increase right elbow extension strength to 5/5 to improve ability for UE use to assist sit>stand and improve ability pushing/lifting activities    Baseline  4+/5    Time  6    Period  Weeks    Status  New    Target Date  08/21/19            Plan - 07/18/19 0547    Clinical Impression Statement  After a completing a few exercises, the pt reported her low back was hurting more and requested a cold pack to address the pain. Ther ex was DCed for the day. Pt reports no change in R elbow pain since treatment started. A 2nd iontophoresis patch treatment was administered.    Examination-Activity Limitations  Dressing;Lift;Locomotion Level;Sleep;Stand;Transfers    PT Treatment/Interventions  ADLs/Self Care Home Management;Cryotherapy;Electrical Stimulation;Iontophoresis 29m/ml Dexamethasone;Moist Heat;Ultrasound;Traction;Stair training;DME Instruction;Gait training;Functional mobility training;Therapeutic activities;Therapeutic exercise;Balance training;Patient/family education;Manual techniques;Passive range of motion;Scar mobilization;Taping;Spinal Manipulations;Joint Manipulations;Dry needling    PT Next Visit Plan  Continue ther ex to address strength, ROM, pain and functional mobility.       Patient will benefit from skilled therapeutic intervention in order to improve the following deficits and impairments:  Abnormal gait, Decreased activity tolerance, Decreased balance, Decreased endurance, Decreased mobility, Decreased strength, Difficulty walking, Hypomobility, Impaired flexibility, Postural dysfunction, Pain, Impaired perceived functional  ability  Visit Diagnosis: Pain in right elbow  Chronic bilateral low back pain with bilateral sciatica  Difficulty in walking, not elsewhere classified  Muscle weakness (generalized)  Stiffness of left hip, not elsewhere classified  Stiffness of right hip, not elsewhere classified  Muscle spasm of back  Pain in left elbow  Localized edema     Problem List Patient Active Problem List   Diagnosis Date Noted  . 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 Spinal stenosis of lumbar region 04/25/2019    Class: Chronic  . Status post lumbar laminectomy 04/25/2019  . Neck pain 11/12/2018  . Chronic pain of right knee 09/13/2018  . Bruising 09/13/2018  . SOB (shortness of breath) 08/30/2018  . Chronic pain of left knee 07/08/2018  . Chronic hip pain 07/08/2018  . Edema 07/08/2018  . Left foot pain 04/08/2018  . Olecranon bursitis  of left elbow 04/05/2018  . Leg swelling 12/05/2017  . Need for shingles vaccine 12/05/2017  . Primary osteoarthritis of both hands 11/23/2017  . Rheumatoid factor positive 11/23/2017  . Primary osteoarthritis of both knees 11/23/2017  . Primary osteoarthritis of both feet 11/23/2017  . DDD (degenerative disc disease), lumbar 11/23/2017  . Vaccine counseling 08/13/2017  . Polyarthralgia 08/13/2017  . Joint stiffness 08/13/2017  . Sensitive skin 01/23/2017  . Need for influenza vaccination 01/23/2017  . Proteinuria 01/23/2017  . History of gastrointestinal stromal tumor (GIST) 04/20/2016  . History of TIA (transient ischemic attack) 04/20/2016  . Screening for breast cancer 04/20/2016  . Estrogen deficiency 04/20/2016  . Chronic maxillary sinusitis 04/20/2016  . Impaired fasting blood sugar 04/20/2016  . Constipation 04/20/2016  . History of fall 04/20/2016  . Chronic radicular lumbar pain 01/27/2016  . Encounter for health maintenance examination in adult 03/22/2015  . Paresthesia 03/22/2015  . Cognitive  decline 03/22/2015  . Screening for cervical cancer 03/22/2015  . Vitamin D deficiency 03/22/2015  . History of uterine leiomyoma 03/22/2015  . Gastroesophageal reflux disease without esophagitis 03/02/2014  . Chronic nausea 03/02/2014  . Chronic abdominal pain 03/02/2014  . Rhinitis, allergic 03/02/2014  . Essential hypertension 03/02/2014  . Hyperlipidemia 03/02/2014  . Obesity with serious comorbidity 01/16/2012    Gar Ponto MS, PT 07/18/19 6:10 AM  [place] [facility] [address] [place], [place], [address] Phone: [phone]   Fax:  [phone]  Name: Lisa Briggs MRN: [id number] Date of Birth: [birth year]

## 2019-07-21 NOTE — Progress Notes (Signed)
I agree. Would encourage increasing physical activity as tolerated for 30 minutes a day 5 days a week.

## 2019-07-22 ENCOUNTER — Other Ambulatory Visit: Payer: Self-pay

## 2019-07-22 ENCOUNTER — Ambulatory Visit: Payer: BC Managed Care – PPO

## 2019-07-22 DIAGNOSIS — G8929 Other chronic pain: Secondary | ICD-10-CM | POA: Diagnosis not present

## 2019-07-22 DIAGNOSIS — M62830 Muscle spasm of back: Secondary | ICD-10-CM | POA: Diagnosis not present

## 2019-07-22 DIAGNOSIS — M6281 Muscle weakness (generalized): Secondary | ICD-10-CM | POA: Diagnosis not present

## 2019-07-22 DIAGNOSIS — M25521 Pain in right elbow: Secondary | ICD-10-CM

## 2019-07-22 DIAGNOSIS — R262 Difficulty in walking, not elsewhere classified: Secondary | ICD-10-CM

## 2019-07-22 DIAGNOSIS — M25651 Stiffness of right hip, not elsewhere classified: Secondary | ICD-10-CM

## 2019-07-22 DIAGNOSIS — M25522 Pain in left elbow: Secondary | ICD-10-CM | POA: Diagnosis not present

## 2019-07-22 DIAGNOSIS — R600 Localized edema: Secondary | ICD-10-CM

## 2019-07-22 DIAGNOSIS — M25652 Stiffness of left hip, not elsewhere classified: Secondary | ICD-10-CM

## 2019-07-22 DIAGNOSIS — M5442 Lumbago with sciatica, left side: Secondary | ICD-10-CM

## 2019-07-22 DIAGNOSIS — M5441 Lumbago with sciatica, right side: Secondary | ICD-10-CM | POA: Diagnosis not present

## 2019-07-22 NOTE — Therapy (Signed)
[place], [place], [address] Phone: [phone]   Fax:  [phone]  Physical Therapy Treatment  Patient Details  Name: Lisa Briggs MRN: [id number] Date of Birth: January 28, 1961 Referring Provider (PT): Basil Dess, MD  Progress Note Reporting Period 06/12/19 to 07/21/09  See note below for Objective Data and Assessment of Progress/Goals.       Encounter Date: 07/22/2019  PT End of Session - 07/22/19 2103    Visit Number  10    Number of Visits  19    Authorization Type  BCBS    PT Start Time  1615    PT Stop Time  1713    PT Time Calculation (min)  58 min    Activity Tolerance  Patient tolerated treatment well    Behavior During Therapy  [facility] for tasks assessed/performed       Past Medical History:  Diagnosis Date  . Allergy   . Anemia    iron therapy for years as of 10/12; normal Hgb 08/2013  . Anxiety   . Arthritis   . Chest pain 04/05/2011   cardiac eval, normal treadmill stress test, Dr. Tollie Eth  . Chronic back pain   . Constipation   . Farsightedness    wears glasses, Eye care center  . 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 [name], [facility]  . GERD (gastroesophageal reflux disease)   . History of uterine fibroid   . Hyperlipidemia   . Hypertension   . Paresthesia 09/2014   initially thought to be TIA, neurology consult in 12/2014 with other non TIA considerations.    . Polyarthralgia    normal rheumatoid screen 01/2012  . TIA (transient ischemic attack) 2015/16    Past Surgical History:  Procedure Laterality Date  . COLONOSCOPY  01/2014   diverticulosis, othwerise normal - Dr. Owens Loffler  . ESOPHAGOGASTRODUODENOSCOPY  2013   Dr. Benson Norway, gastritis  . ESOPHAGOGASTRODUODENOSCOPY  [id number]  . EUS N/A 04/16/2014   Procedure: UPPER ENDOSCOPIC ULTRASOUND (EUS) LINEAR;  Surgeon: Milus Banister, MD;  [facility]: [facility];  Service:  Endoscopy;  Laterality: N/A;  . gall stone surgery    . KNEE ARTHROSCOPY Left   . LAPAROSCOPIC GASTRIC RESECTION N/A 06/09/2014   Procedure: LAPAROSCOPIC GASTRIC MASS RESECTION;  Surgeon: [name], MD;  [facility];  Service: General;  Laterality: N/A;  . LIPOMA EXCISION     forehead  . LUMBAR LAMINECTOMY N/A 04/25/2019   Procedure: LEFT L2-3 MICRODISCECTOMY, BILATERAL L5-S1 PARTIAL HEMILAMINECTOMY;  Surgeon: Jessy Oto, MD;  Location: [place];  Service: Orthopedics;  Laterality: N/A;  . UPPER GASTROINTESTINAL ENDOSCOPY    . UTERINE FIBROID SURGERY      There were no vitals filed for this visit.  Subjective Assessment - 07/22/19 1622    Subjective  Pt reports she is feeling good today. That she is not having low back or R elbow pain today.    Patient Stated Goals  Improve walking, get back to work    Currently in Pain?  Yes    Pain Score  0-No pain    Pain Location  Back    Pain Orientation  Lower    Pain Descriptors / Indicators  Aching;Sharp    Pain Type  Chronic pain    Pain Onset  More than a month ago    Pain Frequency  Intermittent    Aggravating Factors   wrose at night and walking  Pain Relieving Factors  Ice pack    Effect of Pain on Daily Activities  limits activity requiring more time and rest breaks    Multiple Pain Sites  Yes    Pain Score  0    Pain Location  Elbow    Pain Orientation  Right    Pain Descriptors / Indicators  Aching;Discomfort    Pain Type  Chronic pain    Pain Onset  More than a month ago    Aggravating Factors   USe of R UE    Pain Relieving Factors  rest                       [facility] Adult PT Treatment/Exercise - 07/22/19 0001      Lumbar Exercises: Stretches   Passive Hamstring Stretch  Right;Left;2 reps;30 seconds    Single Knee to Chest Stretch  Right;Left;3 reps;30 seconds    Lower Trunk Rotation Limitations  10 reps    Piriformis Stretch  Right;Left;2 reps;30 seconds      Lumbar Exercises: Aerobic    Nustep  L5 x 6 min UE/LE      Lumbar Exercises: Seated   Sit to Stand  5 reps    Sit to Stand Limitations  from edge of mat table c UE assist      Lumbar Exercises: Supine   Clam  20 reps    Clam Limitations  red Theraband    Bent Knee Raise  15 reps    Bent Knee Raise Limitations  alt. opposite UE/LE raises    Bridge Limitations  partial bridge x 15 reps with legs on reversed blue incline wedge    Other Supine Lumbar Exercises  Hooklying add sets c ball, 15x, 3 sec      Cryotherapy   Number Minutes Cryotherapy  15 Minutes    Cryotherapy Location  Lumbar Spine    Type of Cryotherapy  Ice pack               PT Short Term Goals - 07/22/19 2108      PT SHORT TERM GOAL #2   Target Date  07/31/19      PT SHORT TERM GOAL #3   Title  Patient to demonstrate sit to stand without UE support x 5 reps and no LOB. Partially Met-pt completed 2 of 5 stands from a standard chair s use of UEs    Baseline  2 of 5    Time  3    Period  Weeks    Status  Partially Met    Target Date  07/31/19        PT Long Term Goals - 07/10/19 1932      PT LONG TERM GOAL #1   Title  Independent with HEP    Baseline  met for initial HEP, updated today and will continue to update prn pending progress with therapy    Time  6    Period  Weeks    Status  Achieved      PT LONG TERM GOAL #2   Title  Improve FOTO outcome measure score to 45% or less impairment    Baseline  63% limited    Time  6    Period  Weeks    Status  On-going    Target Date  08/21/19      PT LONG TERM GOAL #3   Title  Patient to demonstrate hip flexion strength at least 4-/5 for  improved mobility and gait.    Baseline  right 4-/5, left 4/5    Time  6    Period  Weeks    Status  Achieved      PT LONG TERM GOAL #4   Title  Patient to demonstrate decreased fall risk with 5 times sit to stand in 30 seconds or less    Baseline  63 seconds-previous goal of 15 seconds revised based on signficant time for sit<>stand    Time   6    Period  Weeks    Status  On-going    Target Date  08/21/19      PT LONG TERM GOAL #5   Title  Patient to ambulate 20 minutes without rest and pain no greater than 5/10.    Baseline  goal ongoing    Time  6    Period  Weeks    Status  On-going    Target Date  08/21/19      Additional Long Term Goals   Additional Long Term Goals  Yes      PT LONG TERM GOAL #6   Title  Ambulate with SPC without limitation due to right elbow/triceps pain    Baseline  limited    Time  6    Period  Weeks    Status  New    Target Date  08/21/19      PT LONG TERM GOAL #7   Title  Increase right elbow extension strength to 5/5 to improve ability for UE use to assist sit>stand and improve ability pushing/lifting activities    Baseline  4+/5    Time  6    Period  Weeks    Status  New    Target Date  08/21/19            Plan - 07/22/19 2118    Clinical Impression Statement  Pt was able to complete 2 out of 5 sit t/f stands s [place] of UEs demonstrating some initial strength in LE and core strength. Pt reports using a SPC some of the time, but perfers a RW currently for the greater support. Pt's report re: low back and R elbow pain was better today with pt rating her pain as 0/10.  Pt will benefit    PT Treatment/Interventions  ADLs/Self Care Home Management;Cryotherapy;Electrical Stimulation;Iontophoresis 95m/ml Dexamethasone;Moist Heat;Ultrasound;Traction;Stair training;DME Instruction;Gait training;Functional mobility training;Therapeutic activities;Therapeutic exercise;Balance training;Patient/family education;Manual techniques;Passive range of motion;Scar mobilization;Taping;Spinal Manipulations;Joint Manipulations;Dry needling    PT Next Visit Plan  Continue ther ex to address strength, ROM, pain and functional mobility.       Patient will benefit from skilled therapeutic intervention in order to improve the following deficits and impairments:  Abnormal gait, Decreased activity tolerance,  Decreased balance, Decreased endurance, Decreased mobility, Decreased strength, Difficulty walking, Hypomobility, Impaired flexibility, Postural dysfunction, Pain, Impaired perceived functional ability  Visit Diagnosis: Pain in right elbow  Muscle spasm of back  Chronic bilateral low back pain with bilateral sciatica  Pain in left elbow  Difficulty in walking, not elsewhere classified  Localized edema  Muscle weakness (generalized)  Stiffness of left hip, not elsewhere classified  Stiffness of right hip, not elsewhere classified     Problem List Patient Active Problem List   Diagnosis Date Noted  . 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 Spinal stenosis of lumbar region 04/25/2019    Class: Chronic  . Status post lumbar laminectomy 04/25/2019  . Neck pain 11/12/2018  .  Chronic pain of right knee 09/13/2018  . Bruising 09/13/2018  . SOB (shortness of breath) 08/30/2018  . Chronic pain of left knee 07/08/2018  . Chronic hip pain 07/08/2018  . Edema 07/08/2018  . Left foot pain 04/08/2018  . Olecranon bursitis of left elbow 04/05/2018  . Leg swelling 12/05/2017  . Need for shingles vaccine 12/05/2017  . Primary osteoarthritis of both hands 11/23/2017  . Rheumatoid factor positive 11/23/2017  . Primary osteoarthritis of both knees 11/23/2017  . Primary osteoarthritis of both feet 11/23/2017  . DDD (degenerative disc disease), lumbar 11/23/2017  . Vaccine counseling 08/13/2017  . Polyarthralgia 08/13/2017  . Joint stiffness 08/13/2017  . Sensitive skin 01/23/2017  . Need for influenza vaccination 01/23/2017  . Proteinuria 01/23/2017  . History of gastrointestinal stromal tumor (GIST) 04/20/2016  . History of TIA (transient ischemic attack) 04/20/2016  . Screening for breast cancer 04/20/2016  . Estrogen deficiency 04/20/2016  . Chronic maxillary sinusitis 04/20/2016  . Impaired fasting blood sugar 04/20/2016  . Constipation  04/20/2016  . History of fall 04/20/2016  . Chronic radicular lumbar pain 01/27/2016  . Encounter for health maintenance examination in adult 03/22/2015  . Paresthesia 03/22/2015  . Cognitive decline 03/22/2015  . Screening for cervical cancer 03/22/2015  . Vitamin D deficiency 03/22/2015  . History of uterine leiomyoma 03/22/2015  . Gastroesophageal reflux disease without esophagitis 03/02/2014  . Chronic nausea 03/02/2014  . Chronic abdominal pain 03/02/2014  . Rhinitis, allergic 03/02/2014  . Essential hypertension 03/02/2014  . Hyperlipidemia 03/02/2014  . Obesity with serious comorbidity 01/16/2012    Gar Ponto MS, PT 07/22/19 9:36 PM  [facility] [address] [place], [place], [address] Phone: [phone]   Fax:  [phone]  Name: Lisa Briggs MRN: [id number] Date of Birth: 11-Jul-1960

## 2019-07-23 ENCOUNTER — Other Ambulatory Visit: Payer: Self-pay

## 2019-07-24 ENCOUNTER — Ambulatory Visit: Payer: BC Managed Care – PPO

## 2019-07-24 ENCOUNTER — Other Ambulatory Visit: Payer: Self-pay

## 2019-07-24 DIAGNOSIS — M25522 Pain in left elbow: Secondary | ICD-10-CM

## 2019-07-24 DIAGNOSIS — M25521 Pain in right elbow: Secondary | ICD-10-CM | POA: Diagnosis not present

## 2019-07-24 DIAGNOSIS — M6281 Muscle weakness (generalized): Secondary | ICD-10-CM | POA: Diagnosis not present

## 2019-07-24 DIAGNOSIS — M62830 Muscle spasm of back: Secondary | ICD-10-CM | POA: Diagnosis not present

## 2019-07-24 DIAGNOSIS — R262 Difficulty in walking, not elsewhere classified: Secondary | ICD-10-CM

## 2019-07-24 DIAGNOSIS — R600 Localized edema: Secondary | ICD-10-CM | POA: Diagnosis not present

## 2019-07-24 DIAGNOSIS — G8929 Other chronic pain: Secondary | ICD-10-CM | POA: Diagnosis not present

## 2019-07-24 DIAGNOSIS — M5441 Lumbago with sciatica, right side: Secondary | ICD-10-CM | POA: Diagnosis not present

## 2019-07-24 DIAGNOSIS — M25651 Stiffness of right hip, not elsewhere classified: Secondary | ICD-10-CM | POA: Diagnosis not present

## 2019-07-24 DIAGNOSIS — M25652 Stiffness of left hip, not elsewhere classified: Secondary | ICD-10-CM | POA: Diagnosis not present

## 2019-07-24 DIAGNOSIS — M5442 Lumbago with sciatica, left side: Secondary | ICD-10-CM

## 2019-07-24 NOTE — Therapy (Signed)
[place], [place], [address] Phone: [phone]   Fax:  [phone]  Physical Therapy Treatment  Patient Details  Name: Lisa Briggs MRN: [id number] Date of Birth: 07-25-1960 Referring Provider (PT): Basil Dess, MD   Encounter Date: 07/24/2019  PT End of Session - 07/24/19 1626    Visit Number  11    Number of Visits  19    Date for PT Re-Evaluation  08/21/19    Authorization Type  BCBS    PT Start Time  1619    PT Stop Time  1714    PT Time Calculation (min)  55 min    Activity Tolerance  Patient tolerated treatment well    Behavior During Therapy  [facility] for tasks assessed/performed       Past Medical History:  Diagnosis Date  . Allergy   . Anemia    iron therapy for years as of 10/12; normal Hgb 08/2013  . Anxiety   . Arthritis   . Chest pain 04/05/2011   cardiac eval, normal treadmill stress test, Dr. Tollie Eth  . Chronic back pain   . Constipation   . Farsightedness    wears glasses, [facility] care [facility]  . 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 [name], [facility]  . GERD (gastroesophageal reflux disease)   . History of uterine fibroid   . Hyperlipidemia   . Hypertension   . Paresthesia 09/2014   initially thought to be TIA, neurology consult in 12/2014 with other non TIA considerations.    . Polyarthralgia    normal rheumatoid screen 01/2012  . TIA (transient ischemic attack) 2015/16    Past Surgical History:  Procedure Laterality Date  . COLONOSCOPY  01/2014   diverticulosis, othwerise normal - Dr. Owens Loffler  . ESOPHAGOGASTRODUODENOSCOPY  2013   Dr. Benson Norway, gastritis  . ESOPHAGOGASTRODUODENOSCOPY  [id number]  . EUS N/A 04/16/2014   Procedure: UPPER ENDOSCOPIC ULTRASOUND (EUS) LINEAR;  Surgeon: Milus Banister, MD;  Location: [facility];  Service: Endoscopy;  Laterality: N/A;  . gall stone surgery    . KNEE ARTHROSCOPY Left   . LAPAROSCOPIC  GASTRIC RESECTION N/A 06/09/2014   Procedure: LAPAROSCOPIC GASTRIC MASS RESECTION;  Surgeon: [name], MD;  Location: [facility];  Service: General;  Laterality: N/A;  . LIPOMA EXCISION     forehead  . LUMBAR LAMINECTOMY N/A 04/25/2019   Procedure: LEFT L2-3 MICRODISCECTOMY, BILATERAL L5-S1 PARTIAL HEMILAMINECTOMY;  Surgeon: Jessy Oto, MD;  Location: [facility];  Service: Orthopedics;  Laterality: N/A;  . UPPER GASTROINTESTINAL ENDOSCOPY    . UTERINE FIBROID SURGERY      There were no vitals filed for this visit.  Subjective Assessment - 07/24/19 1631    Subjective  Pt reports the colder temperture this morning caused her to hurt and be more still. She reports feeling better after stretching.                       [facility] Adult PT Treatment/Exercise - 07/24/19 0001      Lumbar Exercises: Aerobic   Nustep  L5 x 10 min UE/LE      Lumbar Exercises: Standing   Heel Raises  10 reps;1 second    Heel Raises Limitations  2 sets on Airex    Row  Strengthening;Both;20 reps;Theraband    Theraband Level (Row)  Level 4 (Blue)    Shoulder Extension  Strengthening;Both;20 reps;Theraband    Theraband Level (  Shoulder Extension)  Level 4 (Blue)    Other Standing Lumbar Exercises  palloff press with blue Theraband 15 ea. way    Other Standing Lumbar Exercises  Rocker board side to side at counter with SBA x 1 min (dynamic balance), Airex marches x 20 with SBA      Lumbar Exercises: Seated   Sit to Stand  5 reps    Sit to Stand Limitations  from edge of mat table c UE assist      Cryotherapy   Number Minutes Cryotherapy  15 Minutes    Cryotherapy Location  Lumbar Spine    Type of Cryotherapy  Ice pack               PT Short Term Goals - 07/22/19 2108      PT SHORT TERM GOAL #2   Target Date  07/31/19      PT SHORT TERM GOAL #3   Title  Patient to demonstrate sit to stand without UE support x 5 reps and no LOB. Partially Met-pt completed 2 of 5 stands from a standard  chair s use of UEs    Baseline  2 of 5    Time  3    Period  Weeks    Status  Partially Met    Target Date  07/31/19        PT Long Term Goals - 07/10/19 1932      PT LONG TERM GOAL #1   Title  Independent with HEP    Baseline  met for initial HEP, updated today and will continue to update prn pending progress with therapy    Time  6    Period  Weeks    Status  Achieved      PT LONG TERM GOAL #2   Title  Improve FOTO outcome measure score to 45% or less impairment    Baseline  63% limited    Time  6    Period  Weeks    Status  On-going    Target Date  08/21/19      PT LONG TERM GOAL #3   Title  Patient to demonstrate hip flexion strength at least 4-/5 for improved mobility and gait.    Baseline  right 4-/5, left 4/5    Time  6    Period  Weeks    Status  Achieved      PT LONG TERM GOAL #4   Title  Patient to demonstrate decreased fall risk with 5 times sit to stand in 30 seconds or less    Baseline  63 seconds-previous goal of 15 seconds revised based on signficant time for sit<>stand    Time  6    Period  Weeks    Status  On-going    Target Date  08/21/19      PT LONG TERM GOAL #5   Title  Patient to ambulate 20 minutes without rest and pain no greater than 5/10.    Baseline  goal ongoing    Time  6    Period  Weeks    Status  On-going    Target Date  08/21/19      Additional Long Term Goals   Additional Long Term Goals  Yes      PT LONG TERM GOAL #6   Title  Ambulate with SPC without limitation due to right elbow/triceps pain    Baseline  limited    Time  6    Period  Weeks    Status  New    Target Date  08/21/19      PT LONG TERM GOAL #7   Title  Increase right elbow extension strength to 5/5 to improve ability for UE use to assist sit>stand and improve ability pushing/lifting activities    Baseline  4+/5    Time  6    Period  Weeks    Status  New    Target Date  08/21/19            Plan - 07/24/19 1712    Clinical Impression Statement   Pt presented to PT today with improved functional mobility using a SPC. Pt was able to walk c a step through gait pattern at a decreased pace. Pace was the same as with the 4wrw.    PT Treatment/Interventions  ADLs/Self Care Home Management;Cryotherapy;Electrical Stimulation;Iontophoresis 15m/ml Dexamethasone;Moist Heat;Ultrasound;Traction;Stair training;DME Instruction;Gait training;Functional mobility training;Therapeutic activities;Therapeutic exercise;Balance training;Patient/family education;Manual techniques;Passive range of motion;Scar mobilization;Taping;Spinal Manipulations;Joint Manipulations;Dry needling    PT Next Visit Plan  Continue to address strength, ROM, and flexibility to address functional mobility       Patient will benefit from skilled therapeutic intervention in order to improve the following deficits and impairments:  Abnormal gait, Decreased activity tolerance, Decreased balance, Decreased endurance, Decreased mobility, Decreased strength, Difficulty walking, Hypomobility, Impaired flexibility, Postural dysfunction, Pain, Impaired perceived functional ability  Visit Diagnosis: Pain in right elbow  Muscle spasm of back  Chronic bilateral low back pain with bilateral sciatica  Pain in left elbow  Difficulty in walking, not elsewhere classified  Localized edema  Muscle weakness (generalized)  Stiffness of right hip, not elsewhere classified  Stiffness of left hip, not elsewhere classified     Problem List Patient Active Problem List   Diagnosis Date Noted  . 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 Spinal stenosis of lumbar region 04/25/2019    Class: Chronic  . Status post lumbar laminectomy 04/25/2019  . Neck pain 11/12/2018  . Chronic pain of right knee 09/13/2018  . Bruising 09/13/2018  . SOB (shortness of breath) 08/30/2018  . Chronic pain of left knee 07/08/2018  . Chronic hip pain 07/08/2018  . Edema 07/08/2018   . Left foot pain 04/08/2018  . Olecranon bursitis of left elbow 04/05/2018  . Leg swelling 12/05/2017  . Need for shingles vaccine 12/05/2017  . Primary osteoarthritis of both hands 11/23/2017  . Rheumatoid factor positive 11/23/2017  . Primary osteoarthritis of both knees 11/23/2017  . Primary osteoarthritis of both feet 11/23/2017  . DDD (degenerative disc disease), lumbar 11/23/2017  . Vaccine counseling 08/13/2017  . Polyarthralgia 08/13/2017  . Joint stiffness 08/13/2017  . Sensitive skin 01/23/2017  . Need for influenza vaccination 01/23/2017  . Proteinuria 01/23/2017  . History of gastrointestinal stromal tumor (GIST) 04/20/2016  . History of TIA (transient ischemic attack) 04/20/2016  . Screening for breast cancer 04/20/2016  . Estrogen deficiency 04/20/2016  . Chronic maxillary sinusitis 04/20/2016  . Impaired fasting blood sugar 04/20/2016  . Constipation 04/20/2016  . History of fall 04/20/2016  . Chronic radicular lumbar pain 01/27/2016  . Encounter for health maintenance examination in adult 03/22/2015  . Paresthesia 03/22/2015  . Cognitive decline 03/22/2015  . Screening for cervical cancer 03/22/2015  . Vitamin D deficiency 03/22/2015  . History of uterine leiomyoma 03/22/2015  . Gastroesophageal reflux disease without esophagitis 03/02/2014  . Chronic nausea 03/02/2014  . Chronic abdominal pain 03/02/2014  . Rhinitis, allergic 03/02/2014  .  Essential hypertension 03/02/2014  . Hyperlipidemia 03/02/2014  . Obesity with serious comorbidity 01/16/2012    Gar Ponto MS, PT 07/24/19 5:35 PM   [facility] [address] [place], [place], [address] Phone: [phone]   Fax:  [phone]  Name: Lisa Briggs MRN: [id number] Date of Birth: 10/02/1960

## 2019-07-27 ENCOUNTER — Other Ambulatory Visit: Payer: Self-pay | Admitting: Physician Assistant

## 2019-07-27 DIAGNOSIS — M0579 Rheumatoid arthritis with rheumatoid factor of multiple sites without organ or systems involvement: Secondary | ICD-10-CM

## 2019-07-28 NOTE — Telephone Encounter (Signed)
Last Visit: 07/07/2019 Next Visit: 12/09/2019 Labs: 07/18/2019 CMP: alk phos 130.  04/26/2019 CBC/BMP: WBC 11.7, hemoglobin 11.3, glucose 100. Eye exam:05/29/2018   We referred patient to Dr. Zenia Resides office to update plaquenil eye exam.   Okay to refill 30 day supply, per Dr. Estanislado Pandy.

## 2019-07-30 ENCOUNTER — Ambulatory Visit: Payer: BC Managed Care – PPO

## 2019-07-30 ENCOUNTER — Other Ambulatory Visit: Payer: Self-pay

## 2019-07-30 DIAGNOSIS — G8929 Other chronic pain: Secondary | ICD-10-CM | POA: Diagnosis not present

## 2019-07-30 DIAGNOSIS — R262 Difficulty in walking, not elsewhere classified: Secondary | ICD-10-CM

## 2019-07-30 DIAGNOSIS — M6281 Muscle weakness (generalized): Secondary | ICD-10-CM

## 2019-07-30 DIAGNOSIS — M25652 Stiffness of left hip, not elsewhere classified: Secondary | ICD-10-CM | POA: Diagnosis not present

## 2019-07-30 DIAGNOSIS — M25521 Pain in right elbow: Secondary | ICD-10-CM | POA: Diagnosis not present

## 2019-07-30 DIAGNOSIS — M62830 Muscle spasm of back: Secondary | ICD-10-CM

## 2019-07-30 DIAGNOSIS — M5442 Lumbago with sciatica, left side: Secondary | ICD-10-CM

## 2019-07-30 DIAGNOSIS — M5441 Lumbago with sciatica, right side: Secondary | ICD-10-CM | POA: Diagnosis not present

## 2019-07-30 DIAGNOSIS — M25522 Pain in left elbow: Secondary | ICD-10-CM | POA: Diagnosis not present

## 2019-07-30 DIAGNOSIS — R600 Localized edema: Secondary | ICD-10-CM | POA: Diagnosis not present

## 2019-07-30 DIAGNOSIS — M25651 Stiffness of right hip, not elsewhere classified: Secondary | ICD-10-CM | POA: Diagnosis not present

## 2019-07-31 ENCOUNTER — Telehealth: Payer: Self-pay | Admitting: Family Medicine

## 2019-07-31 ENCOUNTER — Other Ambulatory Visit: Payer: Self-pay | Admitting: Medical

## 2019-07-31 MED ORDER — FEXOFENADINE HCL 180 MG PO TABS: 180.0000 mg | ORAL_TABLET | Freq: Every day | ORAL | 0 refills | Status: DC

## 2019-07-31 MED ORDER — FLUTICASONE PROPIONATE 50 MCG/ACT NA SUSP: 1.0000 | Freq: Every day | NASAL | 2 refills | Status: DC

## 2019-07-31 NOTE — Telephone Encounter (Signed)
I sent Allegra to take at night and I refilled Flonase nasal spray for allergies

## 2019-07-31 NOTE — Telephone Encounter (Signed)
Pt called and states Claritin no longer works.  Will you call her in something different to [facility]. Sinuses draining for over 1 month since pollen out.

## 2019-08-01 ENCOUNTER — Other Ambulatory Visit: Payer: Self-pay

## 2019-08-01 ENCOUNTER — Telehealth: Payer: Self-pay

## 2019-08-01 DIAGNOSIS — K2950 Unspecified chronic gastritis without bleeding: Secondary | ICD-10-CM

## 2019-08-01 MED ORDER — DEXILANT 60 MG PO CPDR: 60.0000 mg | DELAYED_RELEASE_CAPSULE | Freq: Every day | ORAL | 0 refills | Status: DC

## 2019-08-01 NOTE — Telephone Encounter (Signed)
Pt. Called stating she needed a refill called in for her Dexilant to [facility] and then she stated she needed a refill on all of her meds to [facility]. Pt. Last apt was 07/18/19.

## 2019-08-01 NOTE — Therapy (Signed)
[place], [place], [address] Phone: [phone]   Fax:  [phone]  Physical Therapy Treatment  Patient Details  Name: Lisa Briggs MRN: [id number] Date of Birth: [birth year] Referring Provider (PT): Basil Dess, MD   Encounter Date: 07/30/2019  PT End of Session - 08/01/19 0635    Visit Number  12    Number of Visits  19    Date for PT Re-Evaluation  08/21/19    Authorization Type  BCBS    PT Start Time  1705    PT Stop Time  1805    PT Time Calculation (min)  60 min    Activity Tolerance  Patient tolerated treatment well    Behavior During Therapy  [facility] for tasks assessed/performed       Past Medical History:  Diagnosis Date  . Allergy   . Anemia    iron therapy for years as of 10/12; normal Hgb 08/2013  . Anxiety   . Arthritis   . Chest pain 04/05/2011   cardiac eval, normal treadmill stress test, Dr. Tollie Eth  . Chronic back pain   . Constipation   . Farsightedness    wears glasses, Eye care center  . 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 [name], [facility]  . GERD (gastroesophageal reflux disease)   . History of uterine fibroid   . Hyperlipidemia   . Hypertension   . Paresthesia 09/2014   initially thought to be TIA, neurology consult in 12/2014 with other non TIA considerations.    . Polyarthralgia    normal rheumatoid screen 01/2012  . TIA (transient ischemic attack) 2015/16    Past Surgical History:  Procedure Laterality Date  . COLONOSCOPY  01/2014   diverticulosis, othwerise normal - Dr. Owens Loffler  . ESOPHAGOGASTRODUODENOSCOPY  2013   Dr. Benson Norway, gastritis  . ESOPHAGOGASTRODUODENOSCOPY  [id number]  . EUS N/A 04/16/2014   Procedure: UPPER ENDOSCOPIC ULTRASOUND (EUS) LINEAR;  Surgeon: Milus Banister, MD;  [facility];  Service: Endoscopy;  Laterality: N/A;  . gall stone surgery    . KNEE ARTHROSCOPY Left   . LAPAROSCOPIC  GASTRIC RESECTION N/A 06/09/2014   Procedure: LAPAROSCOPIC GASTRIC MASS RESECTION;  Surgeon: [name], MD;  [facility];  Service: General;  Laterality: N/A;  . LIPOMA EXCISION     forehead  . LUMBAR LAMINECTOMY N/A 04/25/2019   Procedure: LEFT L2-3 MICRODISCECTOMY, BILATERAL L5-S1 PARTIAL HEMILAMINECTOMY;  Surgeon: Jessy Oto, MD;  Location: [facility];  Service: Orthopedics;  Laterality: N/A;  . UPPER GASTROINTESTINAL ENDOSCOPY    . UTERINE FIBROID SURGERY      There were no vitals filed for this visit.                    [facility] Adult PT Treatment/Exercise - 08/01/19 0001      Ambulation/Gait   Gait Comments  Step through gait pattern c [facility]      Exercises   Exercises  Lumbar;Knee/Hip      Lumbar Exercises: Stretches   Passive Hamstring Stretch  Right;Left;2 reps;30 seconds    Single Knee to Chest Stretch  Right;Left;3 reps;30 seconds    Lower Trunk Rotation Limitations  10 reps    Piriformis Stretch  Right;Left;2 reps;30 seconds      Lumbar Exercises: Aerobic   Nustep  L5 x 10 min UE/LE      Lumbar Exercises: Seated   Sit to Stand  5 reps  Sit to Stand Limitations  from edge of mat table c UE assist      Lumbar Exercises: Supine   Clam  20 reps    Clam Limitations  red Theraband    Bent Knee Raise  15 reps    Bent Knee Raise Limitations  alt. opposite UE/LE raises    Bridge Limitations  partial bridge x 15 reps with legs on reversed blue incline wedge    Other Supine Lumbar Exercises  Hooklying add sets c ball, 15x, 3 sec      Cryotherapy   Number Minutes Cryotherapy  15 Minutes    Cryotherapy Location  Lumbar Spine    Type of Cryotherapy  Ice pack               PT Short Term Goals - 07/22/19 2108      PT SHORT TERM GOAL #2   Target Date  07/31/19      PT SHORT TERM GOAL #3   Title  Patient to demonstrate sit to stand without UE support x 5 reps and no LOB. Partially Met-pt completed 2 of 5 stands from a standard chair s use of  UEs    Baseline  2 of 5    Time  3    Period  Weeks    Status  Partially Met    Target Date  07/31/19        PT Long Term Goals - 07/10/19 1932      PT LONG TERM GOAL #1   Title  Independent with HEP    Baseline  met for initial HEP, updated today and will continue to update prn pending progress with therapy    Time  6    Period  Weeks    Status  Achieved      PT LONG TERM GOAL #2   Title  Improve FOTO outcome measure score to 45% or less impairment    Baseline  63% limited    Time  6    Period  Weeks    Status  On-going    Target Date  08/21/19      PT LONG TERM GOAL #3   Title  Patient to demonstrate hip flexion strength at least 4-/5 for improved mobility and gait.    Baseline  right 4-/5, left 4/5    Time  6    Period  Weeks    Status  Achieved      PT LONG TERM GOAL #4   Title  Patient to demonstrate decreased fall risk with 5 times sit to stand in 30 seconds or less    Baseline  63 seconds-previous goal of 15 seconds revised based on signficant time for sit<>stand    Time  6    Period  Weeks    Status  On-going    Target Date  08/21/19      PT LONG TERM GOAL #5   Title  Patient to ambulate 20 minutes without rest and pain no greater than 5/10.    Baseline  goal ongoing    Time  6    Period  Weeks    Status  On-going    Target Date  08/21/19      Additional Long Term Goals   Additional Long Term Goals  Yes      PT LONG TERM GOAL #6   Title  Ambulate with SPC without limitation due to right elbow/triceps pain    Baseline  limited  Time  6    Period  Weeks    Status  New    Target Date  08/21/19      PT LONG TERM GOAL #7   Title  Increase right elbow extension strength to 5/5 to improve ability for UE use to assist sit>stand and improve ability pushing/lifting activities    Baseline  4+/5    Time  6    Period  Weeks    Status  New    Target Date  08/21/19            Plan - 08/01/19 0636    Clinical Impression Statement  PT session  focused on core and LE ROM/flexibilty and stengthening/stability. Pt participated well and tolerated todays session.    PT Treatment/Interventions  ADLs/Self Care Home Management;Cryotherapy;Electrical Stimulation;Iontophoresis 81m/ml Dexamethasone;Moist Heat;Ultrasound;Traction;Stair training;DME Instruction;Gait training;Functional mobility training;Therapeutic activities;Therapeutic exercise;Balance training;Patient/family education;Manual techniques;Passive range of motion;Scar mobilization;Taping;Spinal Manipulations;Joint Manipulations;Dry needling    PT Next Visit Plan  Will progress and add new ther ex to pt's rehab program       Patient will benefit from skilled therapeutic intervention in order to improve the following deficits and impairments:  Abnormal gait, Decreased activity tolerance, Decreased balance, Decreased endurance, Decreased mobility, Decreased strength, Difficulty walking, Hypomobility, Impaired flexibility, Postural dysfunction, Pain, Impaired perceived functional ability  Visit Diagnosis: Pain in right elbow  Muscle spasm of back  Chronic bilateral low back pain with bilateral sciatica  Difficulty in walking, not elsewhere classified  Muscle weakness (generalized)     Problem List Patient Active Problem List   Diagnosis Date Noted  . 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 Spinal stenosis of lumbar region 04/25/2019    Class: Chronic  . Status post lumbar laminectomy 04/25/2019  . Neck pain 11/12/2018  . Chronic pain of right knee 09/13/2018  . Bruising 09/13/2018  . SOB (shortness of breath) 08/30/2018  . Chronic pain of left knee 07/08/2018  . Chronic hip pain 07/08/2018  . Edema 07/08/2018  . Left foot pain 04/08/2018  . Olecranon bursitis of left elbow 04/05/2018  . Leg swelling 12/05/2017  . Need for shingles vaccine 12/05/2017  . Primary osteoarthritis of both hands 11/23/2017  . Rheumatoid factor positive  11/23/2017  . Primary osteoarthritis of both knees 11/23/2017  . Primary osteoarthritis of both feet 11/23/2017  . DDD (degenerative disc disease), lumbar 11/23/2017  . Vaccine counseling 08/13/2017  . Polyarthralgia 08/13/2017  . Joint stiffness 08/13/2017  . Sensitive skin 01/23/2017  . Need for influenza vaccination 01/23/2017  . Proteinuria 01/23/2017  . History of gastrointestinal stromal tumor (GIST) 04/20/2016  . History of TIA (transient ischemic attack) 04/20/2016  . Screening for breast cancer 04/20/2016  . Estrogen deficiency 04/20/2016  . Chronic maxillary sinusitis 04/20/2016  . Impaired fasting blood sugar 04/20/2016  . Constipation 04/20/2016  . History of fall 04/20/2016  . Chronic radicular lumbar pain 01/27/2016  . Encounter for health maintenance examination in adult 03/22/2015  . Paresthesia 03/22/2015  . Cognitive decline 03/22/2015  . Screening for cervical cancer 03/22/2015  . Vitamin D deficiency 03/22/2015  . History of uterine leiomyoma 03/22/2015  . Gastroesophageal reflux disease without esophagitis 03/02/2014  . Chronic nausea 03/02/2014  . Chronic abdominal pain 03/02/2014  . Rhinitis, allergic 03/02/2014  . Essential hypertension 03/02/2014  . Hyperlipidemia 03/02/2014  . Obesity with serious comorbidity 01/16/2012   AGar PontoMS, PT 08/01/19 6:45 AM  [place]  [place], [place], [address] Phone: [phone]   Fax:  [phone]  Name: Sharmeka Palmisano MRN: [id number] Date of Birth: 01-Sep-1960

## 2019-08-01 NOTE — Telephone Encounter (Signed)
Med sent.

## 2019-08-05 LAB — HM DIABETES EYE EXAM

## 2019-08-06 ENCOUNTER — Other Ambulatory Visit: Payer: Self-pay

## 2019-08-06 ENCOUNTER — Encounter: Payer: Self-pay | Admitting: Cardiology

## 2019-08-06 ENCOUNTER — Other Ambulatory Visit: Payer: Self-pay | Admitting: Cardiology

## 2019-08-06 ENCOUNTER — Ambulatory Visit: Payer: PRIVATE HEALTH INSURANCE | Admitting: Cardiology

## 2019-08-06 VITALS — BP 110/62 | HR 84 | Ht 69.0 in | Wt 244.0 lb

## 2019-08-06 DIAGNOSIS — I429 Cardiomyopathy, unspecified: Secondary | ICD-10-CM

## 2019-08-06 DIAGNOSIS — E782 Mixed hyperlipidemia: Secondary | ICD-10-CM

## 2019-08-06 DIAGNOSIS — I10 Essential (primary) hypertension: Secondary | ICD-10-CM

## 2019-08-06 DIAGNOSIS — Z8673 Personal history of transient ischemic attack (TIA), and cerebral infarction without residual deficits: Secondary | ICD-10-CM

## 2019-08-06 MED ORDER — CARVEDILOL 6.25 MG PO TABS: 6.2500 mg | ORAL_TABLET | Freq: Two times a day (BID) | ORAL | 1 refills | Status: DC

## 2019-08-06 MED ORDER — SPIRONOLACTONE 25 MG PO TABS: 25.0000 mg | ORAL_TABLET | Freq: Every day | ORAL | 0 refills | Status: DC

## 2019-08-06 NOTE — Progress Notes (Signed)
Lisa Briggs Date of Birth: May 20, 1960 MRN: [id number] Primary Care Provider:Tysinger, Camelia Eng, PA-C Former Cardiology Providers: Jeri Lager, APRN, FNP-C Primary Cardiologist: Rex Kras, DO, [facility] (established care 07/02/2019)  Date: 08/06/19 Last Office Visit: 07/02/2019  Chief Complaint  Patient presents with  . Cardiomyopathy  . Results   HPI  Lisa Briggs is a 59 y.o.  female who presents to the office with a chief complaint of " cardiomyopathy and follow-up on test results." Patient's past medical history and cardiovascular risk factors include: Seropositive rheumatoid arthritis (Deveshwar), osteoarthritis, DDD, benign essential hypertension, transient ischemic attack, mildly reduced left ventricular systolic function consistent with cardiomyopathy, obesity.   Patient was originally under the care of Jeri Lager, APRN, FNP-C and established care with myself back in March 2021.  During her preoperative risk stratification she underwent an echocardiogram which noted a mildly reduced left ventricular systolic function.  Recommendation was to continue guideline directed medical therapy and to reevaluate her echocardiogram in 3 months.  Repeat echocardiogram noted LVEF to be mildly reduced at 45-50%.  Clinically she is not in congestive heart failure.  At the last office visit we added carvedilol to her medical regimen and recommended a nuclear stress test to rule out an ischemic etiology.   Since last office visit patient underwent a nuclear stress test which showed normal myocardial perfusion.  Images were independently reviewed at today's office visit with the patient.  She is currently on hydrochlorothiazide would recommend discontinuation and initiating spironolactone.    Patient walks on a regular basis and has lost approximately 8 pounds in the last office visit.  History of transient ischemic attack, cardiomyopathy (mildly reduced LVEF). Denies prior history of coronary  artery disease, myocardial infarction, congestive heart failure, deep venous thrombosis, pulmonary embolism, stroke.  FUNCTIONAL STATUS: Walks with a 4 wheel walker. No regular routine or exercise program.    ALLERGIES: Allergies  Allergen Reactions  . Kiwi Extract Hives and Itching  . Mucinex Dm [Dm-Guaifenesin Er] Nausea Only    Messes up her stomach. Pt report on 01/21/15  . Peach [Prunus Persica] Itching    Peach peeling makes pt itch     MEDICATION LIST PRIOR TO VISIT: Current Outpatient Medications on File Prior to Visit  Medication Sig Dispense Refill  . aspirin EC 81 MG tablet Take 1 tablet (81 mg total) by mouth daily. 90 tablet 3  . cholecalciferol (VITAMIN D3) 25 MCG (1000 UT) tablet Take 1 tablet (1,000 Units total) by mouth daily. 90 tablet 3  . dexlansoprazole (DEXILANT) 60 MG capsule Take 1 capsule (60 mg total) by mouth daily. 90 capsule 0  . diclofenac sodium (VOLTAREN) 1 % GEL APPLY 2 GRAMS TO AFFECTED AREA 4 TIMES A DAY (Patient taking differently: Apply 2 g topically 4 (four) times daily as needed (pain). ) 300 g 3  . docusate sodium (COLACE) 100 MG capsule Take 1 capsule (100 mg total) by mouth 2 (two) times daily. 40 capsule 0  . famotidine (PEPCID) 20 MG tablet TAKE 1 TABLET DAILY AT BEDTIME 90 tablet 3  . fexofenadine (ALLEGRA ALLERGY) 180 MG tablet Take 1 tablet (180 mg total) by mouth daily. 90 tablet 0  . fluticasone (FLONASE) 50 MCG/ACT nasal spray Place 1 spray into both nostrils daily. 16 g 2  . gabapentin (NEURONTIN) 300 MG capsule Take 1 capsule (300 mg total) by mouth 3 (three) times daily. 270 capsule 3  . HYDROcodone-acetaminophen (NORCO/VICODIN) 5-325 MG tablet Take 1 tablet by mouth every 6 (six) hours  as needed for moderate pain. 30 tablet 0  . hydrocortisone (PROCTOCORT) 1 % CREA Apply 1 application topically daily. 45 g 1  . hydroxychloroquine (PLAQUENIL) 200 MG tablet TAKE 1 TABLET TWICE A DAY FOR RHEUMATOID ARTHRITIS 60 tablet 0  . linaclotide  (LINZESS) 145 MCG CAPS capsule Take 1 capsule (145 mcg total) by mouth daily before breakfast. 90 capsule 1  . loratadine (CLARITIN) 10 MG tablet Take 10 mg by mouth daily.    Marland Kitchen losartan (COZAAR) 50 MG tablet Take 50 mg by mouth daily.    . meloxicam (MOBIC) 15 MG tablet Take 1 tablet (15 mg total) by mouth daily. 90 tablet 3  . methocarbamol (ROBAXIN) 500 MG tablet Take 1 tablet (500 mg total) by mouth every 6 (six) hours as needed for muscle spasms. 50 tablet 0  . Misc. Devices (ROLLATOR ULTRA-LIGHT) MISC Use as directed. 1 each 0  . ondansetron (ZOFRAN) 4 MG tablet Take 1 tablet (4 mg total) by mouth every 8 (eight) hours as needed for nausea or vomiting. TAKE ONE TABLET BY MOUTH EVERY 8 HOURS AS NEEDED FOR  NAUSEA  OR  VOMITING 90 tablet 0  . potassium chloride (K-DUR) 10 MEQ tablet Take 1 tablet (10 mEq total) by mouth 2 (two) times daily. 180 tablet 3  . simvastatin (ZOCOR) 20 MG tablet TAKE 1 TABLET DAILY (Patient taking differently: Take 20 mg by mouth daily at 6 PM. ) 90 tablet 3  . venlafaxine (EFFEXOR) 37.5 MG tablet Take 1 tablet (37.5 mg total) by mouth daily. 90 tablet 1   No current facility-administered medications on file prior to visit.    PAST MEDICAL HISTORY: Past Medical History:  Diagnosis Date  . Allergy   . Anemia    iron therapy for years as of 10/12; normal Hgb 08/2013  . Anxiety   . Arthritis   . Chest pain 04/05/2011   cardiac eval, normal treadmill stress test, Dr. Tollie Eth  . Chronic back pain   . Constipation   . Farsightedness    wears glasses, [facility]  . 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 [name], [facility]  . GERD (gastroesophageal reflux disease)   . History of uterine fibroid   . Hyperlipidemia   . Hypertension   . Paresthesia 09/2014   initially thought to be TIA, neurology consult in 12/2014 with other non TIA considerations.    . Polyarthralgia    normal rheumatoid screen 01/2012  . TIA  (transient ischemic attack) 2015/16    PAST SURGICAL HISTORY: Past Surgical History:  Procedure Laterality Date  . COLONOSCOPY  01/2014   diverticulosis, othwerise normal - Dr. Owens Loffler  . ESOPHAGOGASTRODUODENOSCOPY  2013   Dr. Benson Norway, gastritis  . ESOPHAGOGASTRODUODENOSCOPY  [id number]  . EUS N/A 04/16/2014   Procedure: UPPER ENDOSCOPIC ULTRASOUND (EUS) LINEAR;  Surgeon: Milus Banister, MD;  Location: [facility];  Service: Endoscopy;  Laterality: N/A;  . gall stone surgery    . KNEE ARTHROSCOPY Left   . LAPAROSCOPIC GASTRIC RESECTION N/A 06/09/2014   Procedure: LAPAROSCOPIC GASTRIC MASS RESECTION;  Surgeon: [name], MD;  Location: [facility];  Service: General;  Laterality: N/A;  . LIPOMA EXCISION     forehead  . LUMBAR LAMINECTOMY N/A 04/25/2019   Procedure: LEFT L2-3 MICRODISCECTOMY, BILATERAL L5-S1 PARTIAL HEMILAMINECTOMY;  Surgeon: Jessy Oto, MD;  Location: [place];  Service: Orthopedics;  Laterality: N/A;  . UPPER GASTROINTESTINAL ENDOSCOPY    . UTERINE FIBROID SURGERY  FAMILY HISTORY: The patient's family history includes Arthritis in her mother; Breast cancer in her mother and sister; Colon cancer (age of onset: 30) in her brother; Diabetes in her brother and maternal aunt; GER disease in her mother; Glaucoma in her mother; Healthy in her son; Heart disease in her maternal aunt, maternal grandfather, maternal grandmother, and maternal uncle; Hypertension in her mother, sister, and sister; Leukemia in her sister; Stroke in her father and maternal uncle.   SOCIAL HISTORY:  The patient  reports that she has never smoked. She has never used smokeless tobacco. She reports that she does not drink alcohol or use drugs.  Review of Systems  Constitution: Negative for chills, fever and weight gain.  HENT: Negative for ear discharge, ear pain and nosebleeds.   Eyes: Negative for blurred vision and discharge.  Cardiovascular: Negative for chest pain, claudication, dyspnea on  exertion, leg swelling, near-syncope, orthopnea, palpitations, paroxysmal nocturnal dyspnea and syncope.  Respiratory: Negative for cough and shortness of breath.   Endocrine: Negative for polydipsia, polyphagia and polyuria.  Hematologic/Lymphatic: Negative for bleeding problem.  Skin: Negative for flushing and nail changes.  Musculoskeletal: Positive for arthritis and joint pain. Negative for muscle cramps, muscle weakness and myalgias.  Gastrointestinal: Negative for abdominal pain, dysphagia, hematemesis, hematochezia, melena, nausea and vomiting.  Neurological: Negative for dizziness, focal weakness and light-headedness.    PHYSICAL EXAM: Vitals with BMI 08/06/2019 07/18/2019 07/10/2019  Height 5\' 9"  - 5\' 9"   Weight 244 lbs 252 lbs 13 oz 230 lbs  BMI 36.02 [id number] [id number]  Systolic [id number]  Diastolic 62 70 65  Pulse 84 68 68   CONSTITUTIONAL: Age-appropriate African-American female, obese, no acute distress, hemodynamically stable.  SKIN: Skin is warm and dry. No rash noted. No cyanosis. No pallor. No jaundice HEAD: Normocephalic and atraumatic.  EYES: No scleral icterus MOUTH/THROAT: Moist oral membranes.  NECK: No JVD present. No thyromegaly noted. No carotid bruits  LYMPHATIC: No visible cervical adenopathy.  CHEST Normal respiratory effort. No intercostal retractions  LUNGS: Clear to auscultation bilaterally.  No stridor. No wheezes. No rales.  CARDIOVASCULAR: Regular rate and rhythm, positive S1-S2, no murmurs rubs or gallops appreciated  ABDOMINAL: No apparent ascites.  EXTREMITIES: No peripheral edema. Left knee brace. HEMATOLOGIC: No significant bruising NEUROLOGIC: Oriented to person, place, and time. Nonfocal. Normal muscle tone.  PSYCHIATRIC: Normal mood and affect. Normal behavior. Cooperative  CARDIAC DATABASE: EKG: 04/17/2019: Normal sinus rhythm at 85 bpm, normal axis, LVH. Nonspecific T wave abnormality.   Echocardiogram: 04/17/2019: LVEF 45-50%, grade 2  diastolic dysfunction, elevated left atrial pressure, mild TR, no pulmonary hypertension.  06/05/2019: LVEF 45-50%, normal wall thickness, mild global hypokinesis, grade 1 diastolic impairment, normal left atrial pressure, mild MR, mild TR, mild TR, no pulmonary hypertension.  Stress Testing:  Lexiscan  Sestamibi Stress Test 07/09/2019: Myocardial perfusion is normal. Stress LV EF: 51%.  No previous exam available for comparison. Low risk study.   Heart Catheterization: None  LABORATORY DATA: CBC Latest Ref Rng & Units 04/26/2019 04/15/2019 03/04/2019  WBC 4.0 - 10.5 K/uL 11.7(H) 6.5 8.3  Hemoglobin 12.0 - 15.0 g/dL 11.3(L) 12.3 12.4  Hematocrit 36.0 - 46.0 % 36.4 41.6 38.6  Platelets 150 - 400 K/uL 304 350 399    CMP Latest Ref Rng & Units 07/18/2019 04/26/2019 04/15/2019  Glucose 65 - 99 mg/dL 86 100(H) 81  BUN 6 - 24 mg/dL 14 11 15   Creatinine 0.57 - 1.00 mg/dL 0.65 0.73 0.73  Sodium 134 - 144  mmol/L 143 138 140  Potassium 3.5 - 5.2 mmol/L 4.2 3.7 3.8  Chloride 96 - 106 mmol/L 103 100 102  CO2 20 - 29 mmol/L 27 27 28   Calcium 8.7 - 10.2 mg/dL 9.8 8.9 9.7  Total Protein 6.0 - 8.5 g/dL 7.0 - 7.3  Total Bilirubin 0.0 - 1.2 mg/dL <0.2 - 0.4  Alkaline Phos 39 - 117 IU/L 130(H) - 114  AST 0 - 40 IU/L 19 - 18  ALT 0 - 32 IU/L 18 - 17    Lipid Panel     Component Value Date/Time   CHOL 162 07/18/2019 1033   TRIG 70 07/18/2019 1033   HDL 77 07/18/2019 1033   CHOLHDL 2.1 07/18/2019 1033   CHOLHDL 2.1 04/20/2016 1122   VLDL 9 04/20/2016 1122   LDLCALC 71 07/18/2019 1033   LABVLDL 14 07/18/2019 1033    Lab Results  Component Value Date   HGBA1C 5.9 (A) 07/18/2019   HGBA1C 5.9 (H) 12/17/2018   HGBA1C 5.9 (H) 04/11/2018   No components found for: NTPROBNP Lab Results  Component Value Date   TSH 1.860 08/30/2018   TSH 0.56 09/20/2016   TSH 0.778 03/22/2015    Cardiac Panel (last 3 results) No results for input(s): CKTOTAL, CKMB, TROPONINIHS, RELINDX in the last 72  hours.  FINAL MEDICATION LIST END OF ENCOUNTER: Meds ordered this encounter  Medications  . spironolactone (ALDACTONE) 25 MG tablet    Sig: Take 1 tablet (25 mg total) by mouth daily.    Dispense:  30 tablet    Refill:  0  . carvedilol (COREG) 6.25 MG tablet    Sig: Take 1 tablet (6.25 mg total) by mouth 2 (two) times daily. Hold if systolic blood pressure (top blood pressure number) less than 100 mmHg or heart rate less than 60 bpm (pulse).    Dispense:  180 tablet    Refill:  1    Medications Discontinued During This Encounter  Medication Reason  . hydrochlorothiazide (HYDRODIURIL) 12.5 MG tablet Change in therapy  . carvedilol (COREG) 6.25 MG tablet Reorder     Current Outpatient Medications:  .  aspirin EC 81 MG tablet, Take 1 tablet (81 mg total) by mouth daily., Disp: 90 tablet, Rfl: 3 .  carvedilol (COREG) 6.25 MG tablet, Take 1 tablet (6.25 mg total) by mouth 2 (two) times daily. Hold if systolic blood pressure (top blood pressure number) less than 100 mmHg or heart rate less than 60 bpm (pulse)., Disp: 180 tablet, Rfl: 1 .  cholecalciferol (VITAMIN D3) 25 MCG (1000 UT) tablet, Take 1 tablet (1,000 Units total) by mouth daily., Disp: 90 tablet, Rfl: 3 .  dexlansoprazole (DEXILANT) 60 MG capsule, Take 1 capsule (60 mg total) by mouth daily., Disp: 90 capsule, Rfl: 0 .  diclofenac sodium (VOLTAREN) 1 % GEL, APPLY 2 GRAMS TO AFFECTED AREA 4 TIMES A DAY (Patient taking differently: Apply 2 g topically 4 (four) times daily as needed (pain). ), Disp: 300 g, Rfl: 3 .  docusate sodium (COLACE) 100 MG capsule, Take 1 capsule (100 mg total) by mouth 2 (two) times daily., Disp: 40 capsule, Rfl: 0 .  famotidine (PEPCID) 20 MG tablet, TAKE 1 TABLET DAILY AT BEDTIME, Disp: 90 tablet, Rfl: 3 .  fexofenadine (ALLEGRA ALLERGY) 180 MG tablet, Take 1 tablet (180 mg total) by mouth daily., Disp: 90 tablet, Rfl: 0 .  fluticasone (FLONASE) 50 MCG/ACT nasal spray, Place 1 spray into both nostrils  daily., Disp: 16 g, Rfl: 2 .  gabapentin (NEURONTIN) 300 MG capsule, Take 1 capsule (300 mg total) by mouth 3 (three) times daily., Disp: 270 capsule, Rfl: 3 .  HYDROcodone-acetaminophen (NORCO/VICODIN) 5-325 MG tablet, Take 1 tablet by mouth every 6 (six) hours as needed for moderate pain., Disp: 30 tablet, Rfl: 0 .  hydrocortisone (PROCTOCORT) 1 % CREA, Apply 1 application topically daily., Disp: 45 g, Rfl: 1 .  hydroxychloroquine (PLAQUENIL) 200 MG tablet, TAKE 1 TABLET TWICE A DAY FOR RHEUMATOID ARTHRITIS, Disp: 60 tablet, Rfl: 0 .  linaclotide (LINZESS) 145 MCG CAPS capsule, Take 1 capsule (145 mcg total) by mouth daily before breakfast., Disp: 90 capsule, Rfl: 1 .  loratadine (CLARITIN) 10 MG tablet, Take 10 mg by mouth daily., Disp: , Rfl:  .  losartan (COZAAR) 50 MG tablet, Take 50 mg by mouth daily., Disp: , Rfl:  .  meloxicam (MOBIC) 15 MG tablet, Take 1 tablet (15 mg total) by mouth daily., Disp: 90 tablet, Rfl: 3 .  methocarbamol (ROBAXIN) 500 MG tablet, Take 1 tablet (500 mg total) by mouth every 6 (six) hours as needed for muscle spasms., Disp: 50 tablet, Rfl: 0 .  Misc. Devices (ROLLATOR ULTRA-LIGHT) MISC, Use as directed., Disp: 1 each, Rfl: 0 .  ondansetron (ZOFRAN) 4 MG tablet, Take 1 tablet (4 mg total) by mouth every 8 (eight) hours as needed for nausea or vomiting. TAKE ONE TABLET BY MOUTH EVERY 8 HOURS AS NEEDED FOR  NAUSEA  OR  VOMITING, Disp: 90 tablet, Rfl: 0 .  potassium chloride (K-DUR) 10 MEQ tablet, Take 1 tablet (10 mEq total) by mouth 2 (two) times daily., Disp: 180 tablet, Rfl: 3 .  simvastatin (ZOCOR) 20 MG tablet, TAKE 1 TABLET DAILY (Patient taking differently: Take 20 mg by mouth daily at 6 PM. ), Disp: 90 tablet, Rfl: 3 .  spironolactone (ALDACTONE) 25 MG tablet, Take 1 tablet (25 mg total) by mouth daily., Disp: 30 tablet, Rfl: 0 .  venlafaxine (EFFEXOR) 37.5 MG tablet, Take 1 tablet (37.5 mg total) by mouth daily., Disp: 90 tablet, Rfl: 1  IMPRESSION:     ICD-10-CM   1. Cardiomyopathy, unspecified type (HCC)  I42.9 spironolactone (ALDACTONE) 25 MG tablet    Basic metabolic panel    Magnesium    carvedilol (COREG) 6.25 MG tablet  2. Benign hypertension  I10   3. Mixed hyperlipidemia  E78.2   4. Class 2 severe obesity due to excess calories with serious comorbidity and body mass index (BMI) of 37.0 to 37.9 in adult ([place])  E66.01    Z68.37   5. History of TIA (transient ischemic attack)  Z86.73      RECOMMENDATIONS: Lisa Briggs is a 59 y.o. female whose past medical history and cardiovascular risk factors include: Seropositive rheumatoid arthritis (Deveshwar), osteoarthritis, DDD, benign essential hypertension, transient ischemic attack, mildly reduced left ventricular systolic function consistent with cardiomyopathy, obesity.  Cardiomyopathy, unspecified:  Patient continues to have mildly reduced left ventricular systolic function consistent with a degree of cardiomyopathy.  I suspect that the underlying cardiomyopathy is probably nonischemic in nature.   Continue losartan.  Discontinue hydrochlorothiazide   Start spironolactone 25 mg p.o. every morning  Refill carvedilol  Continue statin therapy  Check BMP and magnesium in 1 week after the initiation of spironolactone.  Educated on the importance of risk factor modifications given her multiple modifiable cardiovascular risk factors  Encouraged to increase physical activity as tolerated with a goal of 30 minutes a day 5 days a week.  Last office visit patient  has lost 8 pounds for which she is congratulated for her effort and progress.  History of TIA: Currently on aspirin and statin therapy.  Continue risk factor modifications for secondary prevention  Obesity, due to excess calories: Body mass index is 36.03 kg/m. . I reviewed with the patient the importance of diet, regular physical activity/exercise, weight loss.   . Patient is educated on increasing physical activity  gradually as tolerated.  With the goal of moderate intensity exercise for 30 minutes a day 5 days a week.  Orders Placed This Encounter  Procedures  . Basic metabolic panel  . Magnesium   --Continue cardiac medications as reconciled in final medication list. --Return in about 6 months (around 02/06/2020) for re-evaluation of symptoms, reduced EF. Or sooner if needed. --Continue follow-up with your primary care physician regarding the management of your other chronic comorbid conditions.  Patient's questions and concerns were addressed to her satisfaction. She voices understanding of the instructions provided during this encounter.   This note was created using a voice recognition software as a result there may be grammatical errors inadvertently enclosed that do not reflect the nature of this encounter. Every attempt is made to correct such errors.  Rex Kras, [place], [facility]  Pager: [phone] Office: [phone]

## 2019-08-07 ENCOUNTER — Encounter: Payer: Self-pay | Admitting: Specialist

## 2019-08-07 ENCOUNTER — Ambulatory Visit (INDEPENDENT_AMBULATORY_CARE_PROVIDER_SITE_OTHER): Payer: BC Managed Care – PPO | Admitting: Specialist

## 2019-08-07 VITALS — BP 104/65 | HR 65 | Ht 69.0 in | Wt 253.0 lb

## 2019-08-07 DIAGNOSIS — M4726 Other spondylosis with radiculopathy, lumbar region: Secondary | ICD-10-CM | POA: Diagnosis not present

## 2019-08-07 DIAGNOSIS — M5136 Other intervertebral disc degeneration, lumbar region: Secondary | ICD-10-CM | POA: Diagnosis not present

## 2019-08-07 DIAGNOSIS — M51369 Other intervertebral disc degeneration, lumbar region without mention of lumbar back pain or lower extremity pain: Secondary | ICD-10-CM

## 2019-08-07 DIAGNOSIS — M48062 Spinal stenosis, lumbar region with neurogenic claudication: Secondary | ICD-10-CM

## 2019-08-07 DIAGNOSIS — G5603 Carpal tunnel syndrome, bilateral upper limbs: Secondary | ICD-10-CM | POA: Diagnosis not present

## 2019-08-07 NOTE — Patient Instructions (Addendum)
Avoid frequent bending and stooping  No lifting greater than 10 lbs. May use ice or moist heat for pain. Weight loss is of benefit. Best medication for lumbar disc disease is arthritis medications like motrin, celebrex and naprosyn. Due to history of kidney concerns use of these arthritis medication is not recommended. Exercise is important to improve your indurance and does allow people to function better inspite of back pain.

## 2019-08-07 NOTE — Progress Notes (Addendum)
Office Visit Note   [name]           Date of Birth: Oct 29, 1960           MRN: [id number] Visit Date: 08/07/2019              Requested by: Carlena Hurl, PA-C [address] [place],  [place] [address] PCP: Carlena Hurl, PA-C   Assessment & Plan: Visit Diagnoses:  1. Carpal tunnel syndrome, bilateral   2. Spinal stenosis of lumbar region with neurogenic claudication   3. Other spondylosis with radiculopathy, lumbar region   4. DDD (degenerative disc disease), lumbar     Plan: Avoid frequent bending and stooping  No lifting greater than 10 lbs. May use ice or moist heat for pain. Weight loss is of benefit. Best medication for lumbar disc disease is arthritis medications like motrin, celebrex and naprosyn. ExBest medication for lumbar disc disease is arthritis medications like motrin, celebrex and naprosyn. Due to history of kidney concerns use of these arthritis medication is not recommended.ercise is important to improve your indurance and does allow people to function better inspite of back pain.  Follow-Up Instructions: Return in about 4 weeks (around 09/04/2019).   Orders:  No orders of the defined types were placed in this encounter.  No orders of the defined types were placed in this encounter.     Procedures: No procedures performed   Clinical Data: No additional findings.   Subjective: Chief Complaint  Patient presents with  . Lower Back - Follow-up    59 year old female with history of bilateral L4-5 hemilamincectomy for spinal stenosis and DDD and spondylosis. She is still having pain in the lumbar spine and uses a walker with seat and with standing and walking she still experiences pain in the lumbar. A little bid sore on the left lateral ankle and left lateral leg. She is taking tylenol and hydrocodone for pain.  She is not in PT, has not been to PT this week not scheduled by the therapist but is scheduled for next  week.   Review of Systems  Constitutional: Negative.   HENT: Negative.   Eyes: Negative.   Respiratory: Negative.   Cardiovascular: Negative.   Gastrointestinal: Negative.   Endocrine: Negative.   Genitourinary: Negative.   Musculoskeletal: Negative.   Skin: Negative.   Allergic/Immunologic: Negative.   Neurological: Negative.   Hematological: Negative.   Psychiatric/Behavioral: Negative.      Objective: Vital Signs: BP 104/65 (BP Location: Left Arm, Patient Position: Sitting)   Pulse 65   Ht 5\' 9"  (1.753 m)   Wt 253 lb (114.8 kg)   LMP 02/23/2015 (LMP Unknown)   BMI 37.36 kg/m   Physical Exam  Ortho Exam  Specialty Comments:  No specialty comments available.  Imaging: No results found.   PMFS History: Patient Active Problem List   Diagnosis Date Noted  . 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    Priority: High    Class: Chronic  . Spinal stenosis of lumbar region 04/25/2019    Priority: High    Class: Chronic  . Status post lumbar laminectomy 04/25/2019  . Neck pain 11/12/2018  . Chronic pain of right knee 09/13/2018  . Bruising 09/13/2018  . SOB (shortness of breath) 08/30/2018  . Chronic pain of left knee 07/08/2018  . Chronic hip pain 07/08/2018  . Edema 07/08/2018  . Left foot pain 04/08/2018  . Olecranon bursitis of left elbow 04/05/2018  . Leg  swelling 12/05/2017  . Need for shingles vaccine 12/05/2017  . Primary osteoarthritis of both hands 11/23/2017  . Rheumatoid factor positive 11/23/2017  . Primary osteoarthritis of both knees 11/23/2017  . Primary osteoarthritis of both feet 11/23/2017  . DDD (degenerative disc disease), lumbar 11/23/2017  . Vaccine counseling 08/13/2017  . Polyarthralgia 08/13/2017  . Joint stiffness 08/13/2017  . Sensitive skin 01/23/2017  . Need for influenza vaccination 01/23/2017  . Proteinuria 01/23/2017  . History of gastrointestinal stromal tumor (GIST) 04/20/2016  . History of TIA (transient  ischemic attack) 04/20/2016  . Screening for breast cancer 04/20/2016  . Estrogen deficiency 04/20/2016  . Chronic maxillary sinusitis 04/20/2016  . Impaired fasting blood sugar 04/20/2016  . Constipation 04/20/2016  . History of fall 04/20/2016  . Chronic radicular lumbar pain 01/27/2016  . Encounter for health maintenance examination in adult 03/22/2015  . Paresthesia 03/22/2015  . Cognitive decline 03/22/2015  . Screening for cervical cancer 03/22/2015  . Vitamin D deficiency 03/22/2015  . History of uterine leiomyoma 03/22/2015  . Gastroesophageal reflux disease without esophagitis 03/02/2014  . Chronic nausea 03/02/2014  . Chronic abdominal pain 03/02/2014  . Rhinitis, allergic 03/02/2014  . Essential hypertension 03/02/2014  . Hyperlipidemia 03/02/2014  . Obesity with serious comorbidity 01/16/2012   Past Medical History:  Diagnosis Date  . Allergy   . Anemia    iron therapy for years as of 10/12; normal Hgb 08/2013  . Anxiety   . Arthritis   . Chest pain 04/05/2011   cardiac eval, normal treadmill stress test, Dr. Tollie Eth  . Chronic back pain   . Constipation   . Farsightedness    wears glasses, [facility] care center  . Gastrointestinal stromal tumor (GIST) ([place]) 06/2014   Dr. [name], [facility]  . GERD (gastroesophageal reflux disease)   . History of uterine fibroid   . Hyperlipidemia   . Hypertension   . Paresthesia 09/2014   initially thought to be TIA, neurology consult in 12/2014 with other non TIA considerations.    . Polyarthralgia    normal rheumatoid screen 01/2012  . TIA (transient ischemic attack) 2015/16    Family History  Problem Relation Age of Onset  . Hypertension Mother   . Arthritis Mother   . GER disease Mother   . Glaucoma Mother   . Breast cancer Mother   . Stroke Father   . Breast cancer Sister        breast cancer dx late 68s  . Hypertension Sister   . Colon cancer Brother 72  . Diabetes Brother   . Diabetes  Maternal Aunt   . Heart disease Maternal Aunt   . Heart disease Maternal Grandmother   . Heart disease Maternal Grandfather   . Heart disease Maternal Uncle   . Stroke Maternal Uncle   . Leukemia Sister   . Hypertension Sister   . Healthy Son   . Colon polyps Neg Hx   . Esophageal cancer Neg Hx   . Stomach cancer Neg Hx   . Rectal cancer Neg Hx     Past Surgical History:  Procedure Laterality Date  . COLONOSCOPY  01/2014   diverticulosis, othwerise normal - Dr. Owens Loffler  . ESOPHAGOGASTRODUODENOSCOPY  2013   Dr. Benson Norway, gastritis  . ESOPHAGOGASTRODUODENOSCOPY  [id number]  . EUS N/A 04/16/2014   Procedure: UPPER ENDOSCOPIC ULTRASOUND (EUS) LINEAR;  Surgeon: Milus Banister, MD;  Location: [facility];  Service: Endoscopy;  Laterality: N/A;  . gall stone surgery    .  KNEE ARTHROSCOPY Left   . LAPAROSCOPIC GASTRIC RESECTION N/A 06/09/2014   Procedure: LAPAROSCOPIC GASTRIC MASS RESECTION;  Surgeon: [name], MD;  Location: [facility];  Service: General;  Laterality: N/A;  . LIPOMA EXCISION     forehead  . LUMBAR LAMINECTOMY N/A 04/25/2019   Procedure: LEFT L2-3 MICRODISCECTOMY, BILATERAL L5-S1 PARTIAL HEMILAMINECTOMY;  Surgeon: Jessy Oto, MD;  Location: [place];  Service: Orthopedics;  Laterality: N/A;  . UPPER GASTROINTESTINAL ENDOSCOPY    . UTERINE FIBROID SURGERY     Social History   Occupational History  . Occupation: IT sales professional: [employer]  Tobacco Use  . Smoking status: Never Smoker  . Smokeless tobacco: Never Used  Substance and Sexual Activity  . Alcohol use: No    Alcohol/week: 0.0 standard drinks  . Drug use: No  . Sexual activity: Not on file

## 2019-08-11 ENCOUNTER — Ambulatory Visit: Payer: PRIVATE HEALTH INSURANCE

## 2019-08-14 ENCOUNTER — Other Ambulatory Visit: Payer: Self-pay

## 2019-08-14 ENCOUNTER — Ambulatory Visit: Payer: PRIVATE HEALTH INSURANCE | Attending: Specialist

## 2019-08-14 DIAGNOSIS — M25652 Stiffness of left hip, not elsewhere classified: Secondary | ICD-10-CM

## 2019-08-14 DIAGNOSIS — M5442 Lumbago with sciatica, left side: Secondary | ICD-10-CM | POA: Insufficient documentation

## 2019-08-14 DIAGNOSIS — M62830 Muscle spasm of back: Secondary | ICD-10-CM | POA: Diagnosis present

## 2019-08-14 DIAGNOSIS — M25521 Pain in right elbow: Secondary | ICD-10-CM

## 2019-08-14 DIAGNOSIS — G8929 Other chronic pain: Secondary | ICD-10-CM | POA: Diagnosis present

## 2019-08-14 DIAGNOSIS — M5441 Lumbago with sciatica, right side: Secondary | ICD-10-CM | POA: Insufficient documentation

## 2019-08-14 DIAGNOSIS — M25522 Pain in left elbow: Secondary | ICD-10-CM | POA: Diagnosis present

## 2019-08-14 DIAGNOSIS — R262 Difficulty in walking, not elsewhere classified: Secondary | ICD-10-CM

## 2019-08-14 DIAGNOSIS — R600 Localized edema: Secondary | ICD-10-CM

## 2019-08-14 DIAGNOSIS — M25651 Stiffness of right hip, not elsewhere classified: Secondary | ICD-10-CM

## 2019-08-14 DIAGNOSIS — M6281 Muscle weakness (generalized): Secondary | ICD-10-CM | POA: Diagnosis present

## 2019-08-14 LAB — MAGNESIUM: Magnesium: 1.7 mg/dL (ref 1.6–2.3)

## 2019-08-14 LAB — BASIC METABOLIC PANEL
BUN/Creatinine Ratio: 19 (ref 9–23)
BUN: 15 mg/dL (ref 6–24)
CO2: 26 mmol/L (ref 20–29)
Calcium: 9.3 mg/dL (ref 8.7–10.2)
Chloride: 105 mmol/L (ref 96–106)
Creatinine, Ser: 0.79 mg/dL (ref 0.57–1.00)
GFR calc Af Amer: 96 mL/min/{1.73_m2} (ref >59)
GFR calc non Af Amer: 83 mL/min/{1.73_m2} (ref >59)
Glucose: 81 mg/dL (ref 65–99)
Potassium: 4.4 mmol/L (ref 3.5–5.2)
Sodium: 144 mmol/L (ref 134–144)

## 2019-08-14 NOTE — Therapy (Signed)
[place], [place], [address] Phone: [phone]   Fax:  [phone]  Physical Therapy Treatment  Patient Details  Name: Lisa Briggs MRN: [id number] Date of Birth: [birth year] Referring Provider (PT): Basil Dess, MD   Encounter Date: 08/14/2019  PT End of Session - 08/14/19 2301    Visit Number  13    Number of Visits  19    Date for PT Re-Evaluation  08/21/19    Authorization Type  [employer]    PT Start Time  1620    PT Stop Time  1720    PT Time Calculation (min)  60 min    Activity Tolerance  Patient tolerated treatment well    Behavior During Therapy  [facility] for tasks assessed/performed       Past Medical History:  Diagnosis Date  . Allergy   . Anemia    iron therapy for years as of 10/12; normal Hgb 08/2013  . Anxiety   . Arthritis   . Chest pain 04/05/2011   cardiac eval, normal treadmill stress test, Dr. Tollie Eth  . Chronic back pain   . Constipation   . Farsightedness    wears glasses, Eye care center  . Gastrointestinal stromal tumor (GIST) ([place]) 06/2014   Dr. [name], [facility]  . GERD (gastroesophageal reflux disease)   . History of uterine fibroid   . Hyperlipidemia   . Hypertension   . Paresthesia 09/2014   initially thought to be TIA, neurology consult in 12/2014 with other non TIA considerations.    . Polyarthralgia    normal rheumatoid screen 01/2012  . TIA (transient ischemic attack) 2015/16    Past Surgical History:  Procedure Laterality Date  . COLONOSCOPY  01/2014   diverticulosis, othwerise normal - Dr. Owens Loffler  . ESOPHAGOGASTRODUODENOSCOPY  2013   Dr. Benson Norway, gastritis  . ESOPHAGOGASTRODUODENOSCOPY  [id number]  . EUS N/A 04/16/2014   Procedure: UPPER ENDOSCOPIC ULTRASOUND (EUS) LINEAR;  Surgeon: Milus Banister, MD;  Location: [facility];  Service: Endoscopy;  Laterality: N/A;  . gall stone surgery    . KNEE ARTHROSCOPY Left   . LAPAROSCOPIC  GASTRIC RESECTION N/A 06/09/2014   Procedure: LAPAROSCOPIC GASTRIC MASS RESECTION;  Surgeon: [name], MD;  Location: [facility];  Service: General;  Laterality: N/A;  . LIPOMA EXCISION     forehead  . LUMBAR LAMINECTOMY N/A 04/25/2019   Procedure: LEFT L2-3 MICRODISCECTOMY, BILATERAL L5-S1 PARTIAL HEMILAMINECTOMY;  Surgeon: Jessy Oto, MD;  Location: [place];  Service: Orthopedics;  Laterality: N/A;  . UPPER GASTROINTESTINAL ENDOSCOPY    . UTERINE FIBROID SURGERY      There were no vitals filed for this visit.  Subjective Assessment - 08/14/19 1632    Subjective  I've been hurting more. My L knee ahd a catch this past weekend, but it's better. My low back is doing well today c 0 pain.    Currently in Pain?  No/denies                        [facility] Adult PT Treatment/Exercise - 08/14/19 0001      Exercises   Exercises  Lumbar;Knee/Hip      Lumbar Exercises: Aerobic   Nustep  L5 x 8 min UE/LE      Lumbar Exercises: Supine   Clam  20 reps    Clam Limitations  red Theraband    Bent Knee Raise  15 reps  Bent Knee Raise Limitations  alt. opposite UE/LE raises    Bridge Limitations  partial bridge x 15 reps with legs on reversed blue incline wedge    Other Supine Lumbar Exercises  Hooklying add sets c ball, 15x, 3 sec      Knee/Hip Exercises: Standing   Hip Abduction  Stengthening;Right;Left;5 reps;3 sets    Abduction Limitations  counter    Hip Extension  Stengthening;Right;Left;5 reps;3 sets    Extension Limitations  counter    Other Standing Knee Exercises  Mini squats 8x2 at counter          Balance Exercises - 08/14/19 1642      Balance Exercises: Standing   Tandem Stance  3 reps;Upper extremity support 1;Eyes open;10 secs    SLS  Eyes open;3 reps;Upper extremity support 2;10 secs          PT Short Term Goals - 07/22/19 2108      PT SHORT TERM GOAL #2   Target Date  07/31/19      PT SHORT TERM GOAL #3   Title  Patient to demonstrate sit  to stand without UE support x 5 reps and no LOB. Partially Met-pt completed 2 of 5 stands from a standard chair s use of UEs    Baseline  2 of 5    Time  3    Period  Weeks    Status  Partially Met    Target Date  07/31/19        PT Long Term Goals - 07/10/19 1932      PT LONG TERM GOAL #1   Title  Independent with HEP    Baseline  met for initial HEP, updated today and will continue to update prn pending progress with therapy    Time  6    Period  Weeks    Status  Achieved      PT LONG TERM GOAL #2   Title  Improve FOTO outcome measure score to 45% or less impairment    Baseline  63% limited    Time  6    Period  Weeks    Status  On-going    Target Date  08/21/19      PT LONG TERM GOAL #3   Title  Patient to demonstrate hip flexion strength at least 4-/5 for improved mobility and gait.    Baseline  right 4-/5, left 4/5    Time  6    Period  Weeks    Status  Achieved      PT LONG TERM GOAL #4   Title  Patient to demonstrate decreased fall risk with 5 times sit to stand in 30 seconds or less    Baseline  63 seconds-previous goal of 15 seconds revised based on signficant time for sit<>stand    Time  6    Period  Weeks    Status  On-going    Target Date  08/21/19      PT LONG TERM GOAL #5   Title  Patient to ambulate 20 minutes without rest and pain no greater than 5/10.    Baseline  goal ongoing    Time  6    Period  Weeks    Status  On-going    Target Date  08/21/19      Additional Long Term Goals   Additional Long Term Goals  Yes      PT LONG TERM GOAL #6   Title  Ambulate with SPC  without limitation due to right elbow/triceps pain    Baseline  limited    Time  6    Period  Weeks    Status  New    Target Date  08/21/19      PT LONG TERM GOAL #7   Title  Increase right elbow extension strength to 5/5 to improve ability for UE use to assist sit>stand and improve ability pushing/lifting activities    Baseline  4+/5    Time  6    Period  Weeks    Status   New    Target Date  08/21/19            Plan - 08/14/19 2303    Clinical Impression Statement  Pt's functional gains have been slow, but pt is now consistently using a SPC for ambulation. Progress is slow due to multiple areas of body pain.    PT Treatment/Interventions  ADLs/Self Care Home Management;Cryotherapy;Electrical Stimulation;Iontophoresis 45m/ml Dexamethasone;Moist Heat;Ultrasound;Traction;Stair training;DME Instruction;Gait training;Functional mobility training;Therapeutic activities;Therapeutic exercise;Balance training;Patient/family education;Manual techniques;Passive range of motion;Scar mobilization;Taping;Spinal Manipulations;Joint Manipulations;Dry needling;Other (comment)    PT Next Visit Plan  DIscuss c pt her functional goals and adjust her PT program to achieve them       Patient will benefit from skilled therapeutic intervention in order to improve the following deficits and impairments:  Abnormal gait, Decreased activity tolerance, Decreased balance, Decreased endurance, Decreased mobility, Decreased strength, Difficulty walking, Hypomobility, Impaired flexibility, Postural dysfunction, Pain, Impaired perceived functional ability  Visit Diagnosis: Pain in right elbow  Muscle spasm of back  Chronic bilateral low back pain with bilateral sciatica  Difficulty in walking, not elsewhere classified  Muscle weakness (generalized)  Pain in left elbow  Stiffness of right hip, not elsewhere classified  Stiffness of left hip, not elsewhere classified  Localized edema     Problem List Patient Active Problem List   Diagnosis Date Noted  . 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 Spinal stenosis of lumbar region 04/25/2019    Class: Chronic  . Status post lumbar laminectomy 04/25/2019  . Neck pain 11/12/2018  . Chronic pain of right knee 09/13/2018  . Bruising 09/13/2018  . SOB (shortness of breath) 08/30/2018  . Chronic  pain of left knee 07/08/2018  . Chronic hip pain 07/08/2018  . Edema 07/08/2018  . Left foot pain 04/08/2018  . Olecranon bursitis of left elbow 04/05/2018  . Leg swelling 12/05/2017  . Need for shingles vaccine 12/05/2017  . Primary osteoarthritis of both hands 11/23/2017  . Rheumatoid factor positive 11/23/2017  . Primary osteoarthritis of both knees 11/23/2017  . Primary osteoarthritis of both feet 11/23/2017  . DDD (degenerative disc disease), lumbar 11/23/2017  . Vaccine counseling 08/13/2017  . Polyarthralgia 08/13/2017  . Joint stiffness 08/13/2017  . Sensitive skin 01/23/2017  . Need for influenza vaccination 01/23/2017  . Proteinuria 01/23/2017  . History of gastrointestinal stromal tumor (GIST) 04/20/2016  . History of TIA (transient ischemic attack) 04/20/2016  . Screening for breast cancer 04/20/2016  . Estrogen deficiency 04/20/2016  . Chronic maxillary sinusitis 04/20/2016  . Impaired fasting blood sugar 04/20/2016  . Constipation 04/20/2016  . History of fall 04/20/2016  . Chronic radicular lumbar pain 01/27/2016  . Encounter for health maintenance examination in adult 03/22/2015  . Paresthesia 03/22/2015  . Cognitive decline 03/22/2015  . Screening for cervical cancer 03/22/2015  . Vitamin D deficiency 03/22/2015  . History of uterine leiomyoma 03/22/2015  . Gastroesophageal reflux disease  without esophagitis 03/02/2014  . Chronic nausea 03/02/2014  . Chronic abdominal pain 03/02/2014  . Rhinitis, allergic 03/02/2014  . Essential hypertension 03/02/2014  . Hyperlipidemia 03/02/2014  . Obesity with serious comorbidity 01/16/2012    Gar Ponto MS, PT 08/14/19 11:14 PM  [facility] [address] [place], [place], [address] Phone: [phone]   Fax:  [phone]  Name: Tamiya Colello MRN: [id number] Date of Birth: Aug 03, 1960

## 2019-08-18 ENCOUNTER — Ambulatory Visit: Payer: PRIVATE HEALTH INSURANCE

## 2019-08-18 ENCOUNTER — Other Ambulatory Visit: Payer: Self-pay

## 2019-08-18 DIAGNOSIS — M25521 Pain in right elbow: Secondary | ICD-10-CM | POA: Diagnosis not present

## 2019-08-18 DIAGNOSIS — M6281 Muscle weakness (generalized): Secondary | ICD-10-CM

## 2019-08-18 DIAGNOSIS — G8929 Other chronic pain: Secondary | ICD-10-CM

## 2019-08-18 DIAGNOSIS — R262 Difficulty in walking, not elsewhere classified: Secondary | ICD-10-CM

## 2019-08-18 DIAGNOSIS — M62830 Muscle spasm of back: Secondary | ICD-10-CM

## 2019-08-18 DIAGNOSIS — M5442 Lumbago with sciatica, left side: Secondary | ICD-10-CM

## 2019-08-19 ENCOUNTER — Encounter: Payer: Self-pay | Admitting: Medical

## 2019-08-19 ENCOUNTER — Other Ambulatory Visit: Payer: Self-pay | Admitting: Medical

## 2019-08-19 NOTE — Therapy (Signed)
[place], [place], [address] Phone: [phone]   Fax:  [phone]  Physical Therapy Treatment  Patient Details  Name: Lisa Briggs MRN: [id number] Date of Birth: 12/11/1960 Referring Provider (PT): Basil Dess, MD   Encounter Date: 08/18/2019  PT End of Session - 08/19/19 1027    Visit Number  14    Number of Visits  19    Date for PT Re-Evaluation  08/21/19    Authorization Type  BC[employer]    PT Start Time  1625    PT Stop Time  7048    PT Time Calculation (min)  50 min    Activity Tolerance  Patient limited by pain    Behavior During Therapy  [facility] for tasks assessed/performed       Past Medical History:  Diagnosis Date  . Allergy   . Anemia    iron therapy for years as of 10/12; normal Hgb 08/2013  . Anxiety   . Arthritis   . Chest pain 04/05/2011   cardiac eval, normal treadmill stress test, Dr. Tollie Eth  . Chronic back pain   . Constipation   . Farsightedness    wears glasses, [facility]  . 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 [name], [facility]  . GERD (gastroesophageal reflux disease)   . History of uterine fibroid   . Hyperlipidemia   . Hypertension   . Paresthesia 09/2014   initially thought to be TIA, neurology consult in 12/2014 with other non TIA considerations.    . Polyarthralgia    normal rheumatoid screen 01/2012  . TIA (transient ischemic attack) 2015/16    Past Surgical History:  Procedure Laterality Date  . COLONOSCOPY  01/2014   diverticulosis, othwerise normal - Dr. Owens Loffler  . ESOPHAGOGASTRODUODENOSCOPY  2013   Dr. Benson Norway, gastritis  . ESOPHAGOGASTRODUODENOSCOPY  [id number]  . EUS N/A 04/16/2014   Procedure: UPPER ENDOSCOPIC ULTRASOUND (EUS) LINEAR;  Surgeon: Milus Banister, MD;  Location: [facility];  Service: Endoscopy;  Laterality: N/A;  . gall stone surgery    . KNEE ARTHROSCOPY Left   . LAPAROSCOPIC GASTRIC  RESECTION N/A 06/09/2014   Procedure: LAPAROSCOPIC GASTRIC MASS RESECTION;  Surgeon: [name], MD;  Location: [facility];  Service: General;  Laterality: N/A;  . LIPOMA EXCISION     forehead  . LUMBAR LAMINECTOMY N/A 04/25/2019   Procedure: LEFT L2-3 MICRODISCECTOMY, BILATERAL L5-S1 PARTIAL HEMILAMINECTOMY;  Surgeon: Jessy Oto, MD;  Location: [place];  Service: Orthopedics;  Laterality: N/A;  . UPPER GASTROINTESTINAL ENDOSCOPY    . UTERINE FIBROID SURGERY      There were no vitals filed for this visit.  Subjective Assessment - 08/19/19 1020    Subjective  Pt reports no issue c her L knee. Pt reports multiple areas of pain with shoulders, arms, back and hips.    Currently in Pain?  Yes    Pain Score  4     Pain Location  Back    Pain Orientation  Posterior;Lower    Pain Descriptors / Indicators  Aching;Sharp    Pain Type  Chronic pain    Pain Radiating Towards  both hips    Pain Onset  More than a month ago    Pain Frequency  Intermittent    Aggravating Factors   Wrose at night and in the morning    Pain Relieving Factors  Ice pack    Effect of Pain on  Daily Activities  Activities require more time and rest breaks    Pain Score  7    Pain Location  Arm   shoulder   Pain Orientation  --   general   Pain Descriptors / Indicators  Aching    Pain Type  Chronic pain    Pain Onset  More than a month ago    Aggravating Factors   use of UEs    Pain Relieving Factors  Rest    Effect of Pain on Daily Activities  Limits needing more time and rest breaks                        [facility] Adult PT Treatment/Exercise - 08/19/19 0001      Ambulation/Gait   Ambulation/Gait Assistance  6: Modified independent (Device/Increase time)    Ambulation Distance (Feet)  120 Feet    Assistive device  Straight cane    Gait Pattern  Step-through pattern    Gait velocity  .44f/sec      Lumbar Exercises: Stretches   Passive Hamstring Stretch  Right;Left;2 reps;30 seconds    Single  Knee to Chest Stretch  Right;Left;30 seconds;2 reps    Lower Trunk Rotation Limitations  10 reps    Piriformis Stretch  Right;Left;2 reps;30 seconds      Lumbar Exercises: Aerobic   Nustep  L5 x 528m UE/LE      Lumbar Exercises: Supine   Clam  15 reps;3 seconds    Clam Limitations  green theraband    Bent Knee Raise  15 reps    Bent Knee Raise Limitations  alt. opposite UE/LE raises    Bridge Limitations  partial bridge x 15 reps    Other Supine Lumbar Exercises  Hooklying add sets c ball, 15x, 3 sec      Cryotherapy   Number Minutes Cryotherapy  15 Minutes    Cryotherapy Location  Lumbar Spine    Type of Cryotherapy  Ice pack               PT Short Term Goals - 07/22/19 2108      PT SHORT TERM GOAL #2   Target Date  07/31/19      PT SHORT TERM GOAL #3   Title  Patient to demonstrate sit to stand without UE support x 5 reps and no LOB. Partially Met-pt completed 2 of 5 stands from a standard chair s use of UEs    Baseline  2 of 5    Time  3    Period  Weeks    Status  Partially Met    Target Date  07/31/19        PT Long Term Goals - 07/10/19 1932      PT LONG TERM GOAL #1   Title  Independent with HEP    Baseline  met for initial HEP, updated today and will continue to update prn pending progress with therapy    Time  6    Period  Weeks    Status  Achieved      PT LONG TERM GOAL #2   Title  Improve FOTO outcome measure score to 45% or less impairment    Baseline  63% limited    Time  6    Period  Weeks    Status  On-going    Target Date  08/21/19      PT LONG TERM GOAL #3   Title  Patient to  demonstrate hip flexion strength at least 4-/5 for improved mobility and gait.    Baseline  right 4-/5, left 4/5    Time  6    Period  Weeks    Status  Achieved      PT LONG TERM GOAL #4   Title  Patient to demonstrate decreased fall risk with 5 times sit to stand in 30 seconds or less    Baseline  63 seconds-previous goal of 15 seconds revised based on  signficant time for sit<>stand    Time  6    Period  Weeks    Status  On-going    Target Date  08/21/19      PT LONG TERM GOAL #5   Title  Patient to ambulate 20 minutes without rest and pain no greater than 5/10.    Baseline  goal ongoing    Time  6    Period  Weeks    Status  On-going    Target Date  08/21/19      Additional Long Term Goals   Additional Long Term Goals  Yes      PT LONG TERM GOAL #6   Title  Ambulate with SPC without limitation due to right elbow/triceps pain    Baseline  limited    Time  6    Period  Weeks    Status  New    Target Date  08/21/19      PT LONG TERM GOAL #7   Title  Increase right elbow extension strength to 5/5 to improve ability for UE use to assist sit>stand and improve ability pushing/lifting activities    Baseline  4+/5    Time  6    Period  Weeks    Status  New    Target Date  08/21/19            Plan - 08/19/19 1030    Clinical Impression Statement  Pt's participation re: quality of the completion of exs was decreased by multiple sites of chronic pain today. With gait assessment, pt walks at a reduced pace, but demonstrates appropriate bala nce with a [facility]    PT Treatment/Interventions  ADLs/Self Care Home Management;Cryotherapy;Electrical Stimulation;Iontophoresis 39m/ml Dexamethasone;Moist Heat;Ultrasound;Traction;Stair training;DME Instruction;Gait training;Functional mobility training;Therapeutic activities;Therapeutic exercise;Balance training;Patient/family education;Manual techniques;Passive range of motion;Scar mobilization;Taping;Spinal Manipulations;Joint Manipulations;Dry needling;Other (comment)    PT Next Visit Plan  Complete re-eval and discuss functional goals and course of PT       Patient will benefit from skilled therapeutic intervention in order to improve the following deficits and impairments:  Abnormal gait, Decreased activity tolerance, Decreased balance, Decreased endurance, Decreased mobility, Decreased  strength, Difficulty walking, Hypomobility, Impaired flexibility, Postural dysfunction, Pain, Impaired perceived functional ability  Visit Diagnosis: Pain in right elbow  Muscle spasm of back  Chronic bilateral low back pain with bilateral sciatica  Difficulty in walking, not elsewhere classified  Muscle weakness (generalized)     Problem List Patient Active Problem List   Diagnosis Date Noted  . 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 Spinal stenosis of lumbar region 04/25/2019    Class: Chronic  . Status post lumbar laminectomy 04/25/2019  . Neck pain 11/12/2018  . Chronic pain of right knee 09/13/2018  . Bruising 09/13/2018  . SOB (shortness of breath) 08/30/2018  . Chronic pain of left knee 07/08/2018  . Chronic hip pain 07/08/2018  . Edema 07/08/2018  . Left foot pain 04/08/2018  . Olecranon bursitis of  left elbow 04/05/2018  . Leg swelling 12/05/2017  . Need for shingles vaccine 12/05/2017  . Primary osteoarthritis of both hands 11/23/2017  . Rheumatoid factor positive 11/23/2017  . Primary osteoarthritis of both knees 11/23/2017  . Primary osteoarthritis of both feet 11/23/2017  . DDD (degenerative disc disease), lumbar 11/23/2017  . Vaccine counseling 08/13/2017  . Polyarthralgia 08/13/2017  . Joint stiffness 08/13/2017  . Sensitive skin 01/23/2017  . Need for influenza vaccination 01/23/2017  . Proteinuria 01/23/2017  . History of gastrointestinal stromal tumor (GIST) 04/20/2016  . History of TIA (transient ischemic attack) 04/20/2016  . Screening for breast cancer 04/20/2016  . Estrogen deficiency 04/20/2016  . Chronic maxillary sinusitis 04/20/2016  . Impaired fasting blood sugar 04/20/2016  . Constipation 04/20/2016  . History of fall 04/20/2016  . Chronic radicular lumbar pain 01/27/2016  . Encounter for health maintenance examination in adult 03/22/2015  . Paresthesia 03/22/2015  . Cognitive decline 03/22/2015   . Screening for cervical cancer 03/22/2015  . Vitamin D deficiency 03/22/2015  . History of uterine leiomyoma 03/22/2015  . Gastroesophageal reflux disease without esophagitis 03/02/2014  . Chronic nausea 03/02/2014  . Chronic abdominal pain 03/02/2014  . Rhinitis, allergic 03/02/2014  . Essential hypertension 03/02/2014  . Hyperlipidemia 03/02/2014  . Obesity with serious comorbidity 01/16/2012    Gar Ponto MS, PT 08/19/19 10:44 AM   [facility] [address] [place], [place], [address] Phone: [phone]   Fax:  [phone]  Name: Lariza Cothron MRN: [id number] Date of Birth: 19-Dec-1960

## 2019-08-21 ENCOUNTER — Other Ambulatory Visit: Payer: Self-pay

## 2019-08-21 ENCOUNTER — Ambulatory Visit: Payer: PRIVATE HEALTH INSURANCE

## 2019-08-21 DIAGNOSIS — R262 Difficulty in walking, not elsewhere classified: Secondary | ICD-10-CM

## 2019-08-21 DIAGNOSIS — M6281 Muscle weakness (generalized): Secondary | ICD-10-CM

## 2019-08-21 DIAGNOSIS — M5442 Lumbago with sciatica, left side: Secondary | ICD-10-CM

## 2019-08-21 DIAGNOSIS — M25521 Pain in right elbow: Secondary | ICD-10-CM | POA: Diagnosis not present

## 2019-08-21 DIAGNOSIS — G8929 Other chronic pain: Secondary | ICD-10-CM

## 2019-08-22 NOTE — Therapy (Signed)
[place], [place], [address] Phone: [phone]   Fax:  [phone]  Physical Therapy Treatment  Patient Details  Name: Monalisa Bayless MRN: [id number] Date of Birth: 1960-08-22 Referring Provider (PT): Basil Dess, MD   Encounter Date: 08/21/2019  PT End of Session - 08/22/19 0650    Visit Number  15    Number of Visits  19    Date for PT Re-Evaluation  08/21/19    Authorization Type  BCBS    PT Start Time  6294    PT Stop Time  1815    PT Time Calculation (min)  68 min    Activity Tolerance  Patient limited by pain    Behavior During Therapy  [facility] for tasks assessed/performed       Past Medical History:  Diagnosis Date  . Allergy   . Anemia    iron therapy for years as of 10/12; normal Hgb 08/2013  . Anxiety   . Arthritis   . Chest pain 04/05/2011   cardiac eval, normal treadmill stress test, Dr. Tollie Eth  . Chronic back pain   . Constipation   . Farsightedness    wears glasses, [facility]  . 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 [name], [facility]  . GERD (gastroesophageal reflux disease)   . History of uterine fibroid   . Hyperlipidemia   . Hypertension   . Paresthesia 09/2014   initially thought to be TIA, neurology consult in 12/2014 with other non TIA considerations.    . Polyarthralgia    normal rheumatoid screen 01/2012  . TIA (transient ischemic attack) 2015/16    Past Surgical History:  Procedure Laterality Date  . COLONOSCOPY  01/2014   diverticulosis, othwerise normal - Dr. Owens Loffler  . ESOPHAGOGASTRODUODENOSCOPY  2013   Dr. Benson Norway, gastritis  . ESOPHAGOGASTRODUODENOSCOPY  [id number]  . EUS N/A 04/16/2014   Procedure: UPPER ENDOSCOPIC ULTRASOUND (EUS) LINEAR;  Surgeon: Milus Banister, MD;  Location: [facility];  Service: Endoscopy;  Laterality: N/A;  . gall stone surgery    . KNEE ARTHROSCOPY Left   . LAPAROSCOPIC GASTRIC  RESECTION N/A 06/09/2014   Procedure: LAPAROSCOPIC GASTRIC MASS RESECTION;  Surgeon: [name], MD;  Location: [facility];  Service: General;  Laterality: N/A;  . LIPOMA EXCISION     forehead  . LUMBAR LAMINECTOMY N/A 04/25/2019   Procedure: LEFT L2-3 MICRODISCECTOMY, BILATERAL L5-S1 PARTIAL HEMILAMINECTOMY;  Surgeon: Jessy Oto, MD;  Location: [place];  Service: Orthopedics;  Laterality: N/A;  . UPPER GASTROINTESTINAL ENDOSCOPY    . UTERINE FIBROID SURGERY      There were no vitals filed for this visit.  Subjective Assessment - 08/21/19 1712    Subjective  Pt reports today is a better day with her shoulders, arms, and back not bother her as much    Currently in Pain?  Yes    Pain Score  4     Pain Location  Back    Pain Orientation  Posterior    Pain Descriptors / Indicators  Aching;Sharp    Pain Type  Chronic pain    Pain Radiating Towards  Both hips    Pain Onset  More than a month ago    Pain Frequency  Intermittent    Aggravating Factors   Wrose at night and in the AM    Pain Relieving Factors  Ice pack, rest    Effect of Pain on Daily  Activities  Activities require more time and rest breaks    Pain Score  4    Pain Location  Arm    Pain Orientation  Left;Right    Pain Descriptors / Indicators  Aching    Pain Type  Chronic pain    Pain Onset  More than a month ago    Pain Frequency  Intermittent    Aggravating Factors   Use of UEs    Pain Relieving Factors  Rest, cold packs    Effect of Pain on Daily Activities  limits activities requiring more time and rest breaks to complete                        [facility] Adult PT Treatment/Exercise - 08/22/19 0001      Ambulation/Gait   Ambulation/Gait Assistance  6: Modified independent (Device/Increase time)    Ambulation Distance (Feet)  120 Feet    Assistive device  Straight cane    Gait Pattern  Step-through pattern      Exercises   Exercises  Lumbar;Knee/Hip      Lumbar Exercises: Stretches   Single Knee  to Chest Stretch  Right;Left;30 seconds;2 reps    Lower Trunk Rotation Limitations  10 reps    Piriformis Stretch  Right;Left;2 reps;30 seconds      Lumbar Exercises: Aerobic   Nustep  L5 x 2mn UE/LE      Lumbar Exercises: Standing   Row  Strengthening;Both;20 reps;Theraband    Theraband Level (Row)  Level 4 (Blue)    Shoulder Extension  Strengthening;Both;20 reps;Theraband    Theraband Level (Shoulder Extension)  Level 4 (Blue)    Other Standing Lumbar Exercises  Rocker board side to side at counter with SBA x 1 min x 2 (dynamic balance), Airex marches x 20 with SBA      Lumbar Exercises: Supine   Pelvic Tilt  15 reps    Clam  15 reps;3 seconds    Clam Limitations  green theraband    Bent Knee Raise  15 reps    Bent Knee Raise Limitations  alt. opposite UE/LE raises    Bridge Limitations  partial bridge x 15 reps    Other Supine Lumbar Exercises  Hooklying add sets c ball, 15x, 3 sec      Cryotherapy   Number Minutes Cryotherapy  15 Minutes    Cryotherapy Location  Lumbar Spine    Type of Cryotherapy  Ice pack               PT Short Term Goals - 08/22/19 0705      PT SHORT TERM GOAL #2   Title  Patient to ambulate with SPC and S x 200' without LOB and pain no greater than 3/10. Partially met: Pt is able to walk c a SPC s LOB, pain level varies    Status  Partially Met    Target Date  08/21/19      PT SHORT TERM GOAL #3   Title  Patient to demonstrate sit to stand without UE support x 5 reps and no LOB. Not Met: Pt needs use of UEs to complete STS froma standard chair    Target Date  08/21/19        PT Long Term Goals - 08/22/19 0708      PT LONG TERM GOAL #3   Title  Patient to demonstrate hip flexion strength at least 4-/5 for improved mobility and gait. Met: MMTL and  R hip flexion = 4/5    Status  Achieved    Target Date  08/21/19      PT LONG TERM GOAL #4   Title  Patient to demonstrate decreased fall risk with 5 times sit to stand in 30 seconds or  less. Deferred to be assessed. STS pace is slow    Baseline  63 seconds-previous goal of 15 seconds revised based on signficant time for sit<>stand    Target Date  08/21/19      PT LONG TERM GOAL #5   Title  Patient to ambulate 20 minutes without rest and pain no greater than 5/10. Not met. Walking tolerance is limited to 5 mins or less.    Status  Not Met      PT LONG TERM GOAL #6   Title  Ambulate with SPC without limitation due to right elbow/triceps pain. Met: pt is able to walk c a SPC    Status  Achieved      PT LONG TERM GOAL #7   Title  Increase right elbow extension strength to 5/5 to improve ability for UE use to assist sit>stand and improve ability pushing/lifting activities    Baseline  4+/5    Time  6    Period  Weeks    Status  Deferred            Plan - 08/22/19 0716    Clinical Impression Statement  Pt's participation in ther ex was improved today vs the last session, with pt reporting less back and multiple areas of chronic pain. Overall, pt's completion of rehab and daily functional activities varies day to day related to chronic pain issues. Pt has progressed to walking with a SPC, but pace with walking has not improved with c a pace of .4f/sec. The norm for her age group is 4.2 ft/sec. Pt's pace of movement with functional activities, i.e. STS, supine to/from sitting, and rolling, is decreased with pt reporting limitation due to pain. Recommended and discussed with pt the continuation of PT 2w2 to establish a HEP she can continue with and to address management of pain and functional limitations.    PT Treatment/Interventions  ADLs/Self Care Home Management;Cryotherapy;Electrical Stimulation;Iontophoresis 464mml Dexamethasone;Moist Heat;Ultrasound;Traction;Stair training;DME Instruction;Gait training;Functional mobility training;Therapeutic activities;Therapeutic exercise;Balance training;Patient/family education;Manual techniques;Passive range of motion;Scar  mobilization;Taping;Spinal Manipulations;Joint Manipulations;Dry needling;Other (comment)    PT Next Visit Plan  Develop final HEP and address mnagement of pain and functional concerns    PT Home Exercise Plan  seated hamstring stretch, seated piriformis stretch, pelvic tilts, SKTC, hip adduction isometric, sit<>stand, triceps stretch       Patient will benefit from skilled therapeutic intervention in order to improve the following deficits and impairments:     Visit Diagnosis: Difficulty in walking, not elsewhere classified - Plan: PT plan of care cert/re-cert  Muscle weakness (generalized) - Plan: PT plan of care cert/re-cert  Chronic bilateral low back pain with bilateral sciatica - Plan: PT plan of care cert/re-cert  Pain in right elbow - Plan: PT plan of care cert/re-cert     Problem List Patient Active Problem List   Diagnosis Date Noted  . 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 Spinal stenosis of lumbar region 04/25/2019    Class: Chronic  . Status post lumbar laminectomy 04/25/2019  . Neck pain 11/12/2018  . Chronic pain of right knee 09/13/2018  . Bruising 09/13/2018  . SOB (shortness of breath) 08/30/2018  . Chronic pain  of left knee 07/08/2018  . Chronic hip pain 07/08/2018  . Edema 07/08/2018  . Left foot pain 04/08/2018  . Olecranon bursitis of left elbow 04/05/2018  . Leg swelling 12/05/2017  . Need for shingles vaccine 12/05/2017  . Primary osteoarthritis of both hands 11/23/2017  . Rheumatoid factor positive 11/23/2017  . Primary osteoarthritis of both knees 11/23/2017  . Primary osteoarthritis of both feet 11/23/2017  . DDD (degenerative disc disease), lumbar 11/23/2017  . Vaccine counseling 08/13/2017  . Polyarthralgia 08/13/2017  . Joint stiffness 08/13/2017  . Sensitive skin 01/23/2017  . Need for influenza vaccination 01/23/2017  . Proteinuria 01/23/2017  . History of gastrointestinal stromal tumor (GIST)  04/20/2016  . History of TIA (transient ischemic attack) 04/20/2016  . Screening for breast cancer 04/20/2016  . Estrogen deficiency 04/20/2016  . Chronic maxillary sinusitis 04/20/2016  . Impaired fasting blood sugar 04/20/2016  . Constipation 04/20/2016  . History of fall 04/20/2016  . Chronic radicular lumbar pain 01/27/2016  . Encounter for health maintenance examination in adult 03/22/2015  . Paresthesia 03/22/2015  . Cognitive decline 03/22/2015  . Screening for cervical cancer 03/22/2015  . Vitamin D deficiency 03/22/2015  . History of uterine leiomyoma 03/22/2015  . Gastroesophageal reflux disease without esophagitis 03/02/2014  . Chronic nausea 03/02/2014  . Chronic abdominal pain 03/02/2014  . Rhinitis, allergic 03/02/2014  . Essential hypertension 03/02/2014  . Hyperlipidemia 03/02/2014  . Obesity with serious comorbidity 01/16/2012    Gar Ponto MS, PT 08/22/19 7:56 AM  [facility] [address] [place], [place], [address] Phone: [phone]   Fax:  [phone]  Name: Seraiah Nowack MRN: [id number] Date of Birth: [birth year]

## 2019-08-25 ENCOUNTER — Other Ambulatory Visit: Payer: Self-pay

## 2019-08-25 ENCOUNTER — Telehealth: Payer: Self-pay | Admitting: Medical

## 2019-08-25 ENCOUNTER — Encounter: Payer: Self-pay | Admitting: Rehabilitative and Restorative Service Providers"

## 2019-08-25 ENCOUNTER — Ambulatory Visit: Payer: PRIVATE HEALTH INSURANCE | Admitting: Rehabilitative and Restorative Service Providers"

## 2019-08-25 DIAGNOSIS — M6281 Muscle weakness (generalized): Secondary | ICD-10-CM

## 2019-08-25 DIAGNOSIS — M25522 Pain in left elbow: Secondary | ICD-10-CM

## 2019-08-25 DIAGNOSIS — M25521 Pain in right elbow: Secondary | ICD-10-CM | POA: Diagnosis not present

## 2019-08-25 DIAGNOSIS — M5441 Lumbago with sciatica, right side: Secondary | ICD-10-CM

## 2019-08-25 DIAGNOSIS — R600 Localized edema: Secondary | ICD-10-CM

## 2019-08-25 DIAGNOSIS — G8929 Other chronic pain: Secondary | ICD-10-CM

## 2019-08-25 DIAGNOSIS — M25651 Stiffness of right hip, not elsewhere classified: Secondary | ICD-10-CM

## 2019-08-25 DIAGNOSIS — M62830 Muscle spasm of back: Secondary | ICD-10-CM

## 2019-08-25 DIAGNOSIS — R262 Difficulty in walking, not elsewhere classified: Secondary | ICD-10-CM

## 2019-08-25 DIAGNOSIS — M25652 Stiffness of left hip, not elsewhere classified: Secondary | ICD-10-CM

## 2019-08-25 NOTE — Therapy (Signed)
[place], [place], [address] Phone: [phone]   Fax:  [phone]  Physical Therapy Treatment  Patient Details  Name: Lisa Briggs MRN: [id number] Date of Birth: April 13, 1960 Referring Provider (PT): Basil Dess, MD   Encounter Date: 08/25/2019  PT End of Session - 08/25/19 1720    Visit Number  15    Number of Visits  19    Date for PT Re-Evaluation  08/21/19    Authorization Type  BCBS    PT Start Time  1630    PT Stop Time  2563    PT Time Calculation (min)  45 min    Activity Tolerance  Patient limited by lethargy;Treatment limited secondary to agitation    Behavior During Therapy  [facility] for tasks assessed/performed       Past Medical History:  Diagnosis Date  . Allergy   . Anemia    iron therapy for years as of 10/12; normal Hgb 08/2013  . Anxiety   . Arthritis   . Chest pain 04/05/2011   cardiac eval, normal treadmill stress test, Dr. Tollie Eth  . Chronic back pain   . Constipation   . Farsightedness    wears glasses, [facility]  . 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 [name], [facility]  . GERD (gastroesophageal reflux disease)   . History of uterine fibroid   . Hyperlipidemia   . Hypertension   . Paresthesia 09/2014   initially thought to be TIA, neurology consult in 12/2014 with other non TIA considerations.    . Polyarthralgia    normal rheumatoid screen 01/2012  . TIA (transient ischemic attack) 2015/16    Past Surgical History:  Procedure Laterality Date  . COLONOSCOPY  01/2014   diverticulosis, othwerise normal - Dr. Owens Loffler  . ESOPHAGOGASTRODUODENOSCOPY  2013   Dr. Benson Norway, gastritis  . ESOPHAGOGASTRODUODENOSCOPY  [id number]  . EUS N/A 04/16/2014   Procedure: UPPER ENDOSCOPIC ULTRASOUND (EUS) LINEAR;  Surgeon: Milus Banister, MD;  Location: [facility];  Service: Endoscopy;  Laterality: N/A;  . gall stone surgery    . KNEE  ARTHROSCOPY Left   . LAPAROSCOPIC GASTRIC RESECTION N/A 06/09/2014   Procedure: LAPAROSCOPIC GASTRIC MASS RESECTION;  Surgeon: [name], MD;  Location: [facility];  Service: General;  Laterality: N/A;  . LIPOMA EXCISION     forehead  . LUMBAR LAMINECTOMY N/A 04/25/2019   Procedure: LEFT L2-3 MICRODISCECTOMY, BILATERAL L5-S1 PARTIAL HEMILAMINECTOMY;  Surgeon: Jessy Oto, MD;  Location: [facility];  Service: Orthopedics;  Laterality: N/A;  . UPPER GASTROINTESTINAL ENDOSCOPY    . UTERINE FIBROID SURGERY      There were no vitals filed for this visit.  Subjective Assessment - 08/25/19 1630    Subjective  Kasmira had several complaints today.  Low back, L knee, B shoulders, L ankle and carpal tunnel.    How long can you sit comfortably?  Could not get a definitive answer, even with asking multiple different times and in multiple different ways.    How long can you stand comfortably?  Same as sit.    How long can you walk comfortably?  Same as sit.    Patient Stated Goals  Return to normal.    Currently in Pain?  Yes    Pain Score  10-Worst pain ever    Pain Location  Other (Comment)   Back, shoulders, elbows, ankle, knee, hands...   Pain Orientation  Lower;Mid;Upper  Pain Descriptors / Indicators  Sharp;Shooting;Sore;Stabbing;Tender;Throbbing;Burning    Pain Type  Chronic pain    Pain Onset  More than a month ago    Pain Frequency  Constant    Aggravating Factors   Everything    Pain Relieving Factors  Meds help    Effect of Pain on Daily Activities  Complaints of pain with all ADLs    Pain Onset  More than a month ago                        [facility] Adult PT Treatment/Exercise - 08/25/19 0001      Bed Mobility   Bed Mobility  Rolling Right;Rolling Left;Right Sidelying to Sit;Left Sidelying to Sit;Supine to Sit;Sitting - Scoot to Edge of Bed    Rolling Right  Supervision/verbal cueing    Rolling Left  Supervision/Verbal cueing    Right Sidelying to Sit   Supervision/Verbal cueing    Left Sidelying to Sit  Supervision/Verbal cueing    Supine to Sit  Supervision/Verbal cueing    Sitting - Scoot to Edge of Bed  Supervision/Verbal cueing    Sit to Sidelying Right  Supervision/Verbal cueing      Posture/Postural Control   Posture Comments  Worked on standing posture      Therapeutic Activites    Therapeutic Activities  ADL's    ADL's  Bed mobility, posture      Exercises   Exercises  Lumbar;Knee/Hip      Lumbar Exercises: Stretches   Single Knee to Chest Stretch  Right;Left;30 seconds;2 reps    Lower Trunk Rotation Limitations  10 reps      Lumbar Exercises: Aerobic   Nustep  L5 x 18mn UE/LE      Lumbar Exercises: Standing   Other Standing Lumbar Exercises  Shoulder blade pinches 10X 5 seconds    Other Standing Lumbar Exercises  Standing trunk extensions 10X 3 seconds      Cryotherapy   Number Minutes Cryotherapy  10 Minutes    Cryotherapy Location  Lumbar Spine;Shoulder    Type of Cryotherapy  Ice pack             PT Education - 08/25/19 1816    Education Details  Spent quite a bit of time reviewing body mechanics and the importance of frequent changes of position to reduce pain.    Person(s) Educated  Patient    Methods  Explanation;Demonstration;Verbal cues    Comprehension  Verbal cues required;Need further instruction;Other (comment)   YLeathieseemed convinced she knew more about her problem than her PT with a master's degree and 22 years of clinical practice.      PT Short Term Goals - 08/22/19 0705      PT SHORT TERM GOAL #2   Title  Patient to ambulate with SPC and S x 200' without LOB and pain no greater than 3/10. Partially met: Pt is able to walk c a SPC s LOB, pain level varies    Status  Partially Met    Target Date  08/21/19      PT SHORT TERM GOAL #3   Title  Patient to demonstrate sit to stand without UE support x 5 reps and no LOB. Not Met: Pt needs use of UEs to complete STS froma standard chair     Target Date  08/21/19        PT Long Term Goals - 08/22/19 0708      PT LONG TERM  GOAL #3   Title  Patient to demonstrate hip flexion strength at least 4-/5 for improved mobility and gait. Met: MMTL and R hip flexion = 4/5    Status  Achieved    Target Date  08/21/19      PT LONG TERM GOAL #4   Title  Patient to demonstrate decreased fall risk with 5 times sit to stand in 30 seconds or less. Deferred to be assessed. STS pace is slow    Baseline  63 seconds-previous goal of 15 seconds revised based on signficant time for sit<>stand    Target Date  08/21/19      PT LONG TERM GOAL #5   Title  Patient to ambulate 20 minutes without rest and pain no greater than 5/10. Not met. Walking tolerance is limited to 5 mins or less.    Status  Not Met      PT LONG TERM GOAL #6   Title  Ambulate with SPC without limitation due to right elbow/triceps pain. Met: pt is able to walk c a SPC    Status  Achieved      PT LONG TERM GOAL #7   Title  Increase right elbow extension strength to 5/5 to improve ability for UE use to assist sit>stand and improve ability pushing/lifting activities    Baseline  4+/5    Time  6    Period  Weeks    Status  Deferred            Plan - 08/25/19 1716    Clinical Impression Statement  Lisa Briggs complains of pain in multiple body parts at a 10/10 level while in no acute distress.  She claims to "know my body" when given education regarding the importance of changing positions and appropriate therapeutic exercises.  Given her current multiple complaints, complaints of 10/10 pain while in no acute distress and given the amount of time she has been out of work, her prognosis appears self-limited.  This may be an appropriate referral for FCE.    Personal Factors and Comorbidities  Behavior Pattern    PT Treatment/Interventions  ADLs/Self Care Home Management;Cryotherapy;Electrical Stimulation;Iontophoresis 10m/ml Dexamethasone;Moist Heat;Ultrasound;Traction;Stair  training;DME Instruction;Gait training;Functional mobility training;Therapeutic activities;Therapeutic exercise;Balance training;Patient/family education;Manual techniques;Passive range of motion;Scar mobilization;Taping;Spinal Manipulations;Joint Manipulations;Dry needling;Other (comment)    PT Next Visit Plan  Develop final HEP and address mnagement of pain and functional concerns    PT Home Exercise Plan  seated hamstring stretch, seated piriformis stretch, pelvic tilts, SKTC, hip adduction isometric, sit<>stand, triceps stretch       Patient will benefit from skilled therapeutic intervention in order to improve the following deficits and impairments:     Visit Diagnosis: Difficulty in walking, not elsewhere classified  Muscle weakness (generalized)  Chronic bilateral low back pain with bilateral sciatica  Pain in right elbow  Muscle spasm of back  Localized edema  Stiffness of left hip, not elsewhere classified  Stiffness of right hip, not elsewhere classified  Pain in left elbow     Problem List Patient Active Problem List   Diagnosis Date Noted  . 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 Spinal stenosis of lumbar region 04/25/2019    Class: Chronic  . Status post lumbar laminectomy 04/25/2019  . Neck pain 11/12/2018  . Chronic pain of right knee 09/13/2018  . Bruising 09/13/2018  . SOB (shortness of breath) 08/30/2018  . Chronic pain of left knee 07/08/2018  . Chronic hip pain 07/08/2018  .  Edema 07/08/2018  . Left foot pain 04/08/2018  . Olecranon bursitis of left elbow 04/05/2018  . Leg swelling 12/05/2017  . Need for shingles vaccine 12/05/2017  . Primary osteoarthritis of both hands 11/23/2017  . Rheumatoid factor positive 11/23/2017  . Primary osteoarthritis of both knees 11/23/2017  . Primary osteoarthritis of both feet 11/23/2017  . DDD (degenerative disc disease), lumbar 11/23/2017  . Vaccine counseling 08/13/2017   . Polyarthralgia 08/13/2017  . Joint stiffness 08/13/2017  . Sensitive skin 01/23/2017  . Need for influenza vaccination 01/23/2017  . Proteinuria 01/23/2017  . History of gastrointestinal stromal tumor (GIST) 04/20/2016  . History of TIA (transient ischemic attack) 04/20/2016  . Screening for breast cancer 04/20/2016  . Estrogen deficiency 04/20/2016  . Chronic maxillary sinusitis 04/20/2016  . Impaired fasting blood sugar 04/20/2016  . Constipation 04/20/2016  . History of fall 04/20/2016  . Chronic radicular lumbar pain 01/27/2016  . Encounter for health maintenance examination in adult 03/22/2015  . Paresthesia 03/22/2015  . Cognitive decline 03/22/2015  . Screening for cervical cancer 03/22/2015  . Vitamin D deficiency 03/22/2015  . History of uterine leiomyoma 03/22/2015  . Gastroesophageal reflux disease without esophagitis 03/02/2014  . Chronic nausea 03/02/2014  . Chronic abdominal pain 03/02/2014  . Rhinitis, allergic 03/02/2014  . Essential hypertension 03/02/2014  . Hyperlipidemia 03/02/2014  . Obesity with serious comorbidity 01/16/2012    Farley Ly  PT, MPT 08/25/2019, 6:27 PM  [facility] [address] [place], [place], [address] Phone: [phone]   Fax:  [phone]  Name: Levon Penning MRN: [id number] Date of Birth: 09/24/1960

## 2019-08-25 NOTE — Telephone Encounter (Signed)
Pt called and said she no longer uses [facility] and will be using [facility]. She will need all prescriptions sent there in the future

## 2019-08-28 ENCOUNTER — Other Ambulatory Visit: Payer: Self-pay

## 2019-08-28 ENCOUNTER — Ambulatory Visit: Payer: PRIVATE HEALTH INSURANCE

## 2019-08-28 DIAGNOSIS — R262 Difficulty in walking, not elsewhere classified: Secondary | ICD-10-CM

## 2019-08-28 DIAGNOSIS — M6281 Muscle weakness (generalized): Secondary | ICD-10-CM

## 2019-08-28 DIAGNOSIS — G8929 Other chronic pain: Secondary | ICD-10-CM

## 2019-08-28 DIAGNOSIS — M25521 Pain in right elbow: Secondary | ICD-10-CM

## 2019-08-28 DIAGNOSIS — M5442 Lumbago with sciatica, left side: Secondary | ICD-10-CM

## 2019-08-28 DIAGNOSIS — M62830 Muscle spasm of back: Secondary | ICD-10-CM

## 2019-08-29 ENCOUNTER — Other Ambulatory Visit: Payer: Self-pay

## 2019-08-29 DIAGNOSIS — R110 Nausea: Secondary | ICD-10-CM

## 2019-08-29 DIAGNOSIS — I429 Cardiomyopathy, unspecified: Secondary | ICD-10-CM

## 2019-08-29 DIAGNOSIS — K2950 Unspecified chronic gastritis without bleeding: Secondary | ICD-10-CM

## 2019-08-29 MED ORDER — LINACLOTIDE 145 MCG PO CAPS: 145.0000 ug | ORAL_CAPSULE | Freq: Every day | ORAL | 1 refills | Status: DC

## 2019-08-29 MED ORDER — FAMOTIDINE 20 MG PO TABS: ORAL_TABLET | ORAL | 3 refills | Status: DC

## 2019-08-29 MED ORDER — VITAMIN D 25 MCG (1000 UNIT) PO TABS: 1000.0000 [IU] | ORAL_TABLET | Freq: Every day | ORAL | 3 refills | Status: DC

## 2019-08-29 MED ORDER — FEXOFENADINE HCL 180 MG PO TABS: 180.0000 mg | ORAL_TABLET | Freq: Every day | ORAL | 0 refills | Status: DC

## 2019-08-29 MED ORDER — SPIRONOLACTONE 25 MG PO TABS: 25.0000 mg | ORAL_TABLET | Freq: Every day | ORAL | 1 refills | Status: DC

## 2019-08-29 MED ORDER — DEXILANT 60 MG PO CPDR: 60.0000 mg | DELAYED_RELEASE_CAPSULE | Freq: Every day | ORAL | 0 refills | Status: DC

## 2019-08-29 MED ORDER — ONDANSETRON HCL 4 MG PO TABS: 4.0000 mg | ORAL_TABLET | Freq: Three times a day (TID) | ORAL | 0 refills | Status: DC | PRN

## 2019-08-29 MED ORDER — FLUTICASONE PROPIONATE 50 MCG/ACT NA SUSP: 1.0000 | Freq: Every day | NASAL | 2 refills | Status: DC

## 2019-08-29 MED ORDER — CARVEDILOL 6.25 MG PO TABS: 6.2500 mg | ORAL_TABLET | Freq: Two times a day (BID) | ORAL | 1 refills | Status: DC

## 2019-08-29 MED ORDER — ASPIRIN EC 81 MG PO TBEC: 81.0000 mg | DELAYED_RELEASE_TABLET | Freq: Every day | ORAL | 3 refills | Status: DC

## 2019-08-29 MED ORDER — POTASSIUM CHLORIDE CRYS ER 10 MEQ PO TBCR: 10.0000 meq | EXTENDED_RELEASE_TABLET | Freq: Two times a day (BID) | ORAL | 3 refills | Status: DC

## 2019-08-29 MED ORDER — SIMVASTATIN 20 MG PO TABS: 20.0000 mg | ORAL_TABLET | Freq: Every day | ORAL | 3 refills | Status: DC

## 2019-08-29 MED ORDER — VENLAFAXINE HCL 37.5 MG PO TABS: 37.5000 mg | ORAL_TABLET | Freq: Every day | ORAL | 1 refills | Status: DC

## 2019-08-29 NOTE — Telephone Encounter (Signed)
See her message.  Please send all of those medicines that I prescribe to [facility]

## 2019-08-29 NOTE — Therapy (Addendum)
[place], [place], [address] Phone: [phone]   Fax:  [phone]  Physical Therapy Treatment/Discharge Summary  Patient Details  Name: Lisa Briggs MRN: [id number] Date of Birth: 1960-08-27 Referring Provider (PT): Basil Dess, MD   Encounter Date: 08/28/2019  PT End of Session - 08/29/19 1710    Visit Number  16    Authorization Type  BCBS    PT Start Time  8887    PT Stop Time  5797    PT Time Calculation (min)  55 min    Activity Tolerance  Patient limited by lethargy;Patient limited by pain;Patient tolerated treatment well    Behavior During Therapy  [facility] for tasks assessed/performed       Past Medical History:  Diagnosis Date  . Allergy   . Anemia    iron therapy for years as of 10/12; normal Hgb 08/2013  . Anxiety   . Arthritis   . Chest pain 04/05/2011   cardiac eval, normal treadmill stress test, Dr. Tollie Eth  . Chronic back pain   . Constipation   . Farsightedness    wears glasses, Eye care center  . 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 [name], [facility]  . GERD (gastroesophageal reflux disease)   . History of uterine fibroid   . Hyperlipidemia   . Hypertension   . Paresthesia 09/2014   initially thought to be TIA, neurology consult in 12/2014 with other non TIA considerations.    . Polyarthralgia    normal rheumatoid screen 01/2012  . TIA (transient ischemic attack) 2015/16    Past Surgical History:  Procedure Laterality Date  . COLONOSCOPY  01/2014   diverticulosis, othwerise normal - Dr. Owens Loffler  . ESOPHAGOGASTRODUODENOSCOPY  2013   Dr. Benson Norway, gastritis  . ESOPHAGOGASTRODUODENOSCOPY  [id number]  . EUS N/A 04/16/2014   Procedure: UPPER ENDOSCOPIC ULTRASOUND (EUS) LINEAR;  Surgeon: Milus Banister, MD;  [facility]: [facility];  Service: Endoscopy;  Laterality: N/A;  . gall stone surgery    . KNEE ARTHROSCOPY Left   .  LAPAROSCOPIC GASTRIC RESECTION N/A 06/09/2014   Procedure: LAPAROSCOPIC GASTRIC MASS RESECTION;  Surgeon: [name], MD;  [facility];  Service: General;  Laterality: N/A;  . LIPOMA EXCISION     forehead  . LUMBAR LAMINECTOMY N/A 04/25/2019   Procedure: LEFT L2-3 MICRODISCECTOMY, BILATERAL L5-S1 PARTIAL HEMILAMINECTOMY;  Surgeon: Jessy Oto, MD;  Location: [facility];  Service: Orthopedics;  Laterality: N/A;  . UPPER GASTROINTESTINAL ENDOSCOPY    . UTERINE FIBROID SURGERY      There were no vitals filed for this visit.  Subjective Assessment - 08/28/19 1721    Subjective  Pt reports today is one of those bad days. Pt reports multiple areas of pain with bilat shoulder, arm, elbow, wrist, back, L knee, and ankle pain. Rates back pain 5/10 and shoulders 7/10.    Currently in Pain?  Yes    Pain Score  5     Pain Location  Back    Pain Orientation  Upper;Mid;Lower;Posterior    Pain Descriptors / Indicators  Sharp;Shooting;Stabbing;Tender;Throbbing;Burning    Pain Type  Chronic pain    Pain Radiating Towards  Both hips    Pain Onset  More than a month ago    Pain Frequency  Constant    Aggravating Factors   with majority of activities    Pain Relieving Factors  Meds, rest, cold packs    Effect  of Pain on Daily Activities  pain limits activities with more rets breaks and time to complete    Pain Score  7    Pain Location  Arm    Pain Orientation  Right;Left    Pain Descriptors / Indicators  Aching    Pain Type  Chronic pain    Pain Onset  More than a month ago    Aggravating Factors   Majority of activities    Pain Relieving Factors  Rasr, meds, cold pack    Effect of Pain on Daily Activities  pain limits activities with more rets breaks and time to complete                        [facility] Adult PT Treatment/Exercise - 08/29/19 0001      Ambulation/Gait   Ambulation/Gait Assistance  6: Modified independent (Device/Increase time)    Ambulation Distance (Feet)   120 Feet   2 sets   Assistive device  Straight cane    Gait Pattern  Step-through pattern   heel to toe gait pattern     Exercises   Exercises  Lumbar;Knee/Hip      Lumbar Exercises: Stretches   Single Knee to Chest Stretch  Right;Left;30 seconds;2 reps    Lower Trunk Rotation Limitations  10 reps      Lumbar Exercises: Aerobic   Nustep  L5 x 71mn UE/LE      Lumbar Exercises: Standing   Heel Raises  5 reps    Heel Raises Limitations  attempted but DCed with pt reporting too much LBP    Functional Squats  5 reps    Functional Squats Limitations  attemted but DCed with pt reporting too much LBP      Lumbar Exercises: Supine   Pelvic Tilt  15 reps    Clam  15 reps;3 seconds    Clam Limitations  green theraband    Bent Knee Raise  15 reps    Bent Knee Raise Limitations  alt. opposite UE/LE raises    Bridge Limitations  partial bridge x 15 reps    Other Supine Lumbar Exercises  Hooklying add sets c ball, 15x, 3 sec      Cryotherapy   Number Minutes Cryotherapy  15 Minutes    Cryotherapy Location  Lumbar Spine;Shoulder    Type of Cryotherapy  Ice pack             PT Education - 08/29/19 1709    Education Details  Pt is ind with her HEP    Person(s) Educated  Patient    Methods  Other (comment)   reviewed   Comprehension  Returned demonstration;Verbalized understanding       PT Short Term Goals - 08/29/19 1724      PT SHORT TERM GOAL #2   Title  Patient to ambulate with SPC and S x 200' without LOB and pain no greater than 3/10. Partially met: Pt is able to walk c a SPC s LOB, pain level varies    Baseline  has demonstrated cane use but continues to use rollator-elbow pain has been limiting factor for cane use, continue goal    Period  Weeks    Status  Partially Met      PT SHORT TERM GOAL #3   Title  Patient to demonstrate sit to stand without UE support x 5 reps and no LOB. Not Met: Pt needs use of UEs to complete STS froma standard  chair    Baseline  2 of 5     Status  Not Met        PT Long Term Goals - 08/29/19 1725      PT LONG TERM GOAL #1   Title  Independent with HEP. Met    Status  Achieved      PT LONG TERM GOAL #2   Status  --   Not re-assessed     PT LONG TERM GOAL #3   Title  Patient to demonstrate hip flexion strength at least 4-/5 for improved mobility and gait. Met: MMTL and R hip flexion = 4/5    Baseline  right 4-/5, left 4/5    Status  Not Met      PT LONG TERM GOAL #4   Title  Patient to demonstrate decreased fall risk with 5 times sit to stand in 30 seconds or less. Not met. pt is not able to completes sue of arms    Status  Not Met      PT LONG TERM GOAL #5   Title  Patient to ambulate 20 minutes without rest and pain no greater than 5/10. Not met. Walking tolerance is limited to 5 mins or less. Not met. DIstance is limited to approx 247f    Status  Not Met      PT LONG TERM GOAL #7   Title  Increase right elbow extension strength to 5/5 to improve ability for UE use to assist sit>stand and improve ability pushing/lifting activities. Patially met- Pt is able to use a SPC in her R hand and uses her R arm c sit t/f standing and sit t/f supine. MMT strength = 4+/5.    Status  Partially Met            Plan - 08/29/19 1713    Clinical Impression Statement  Pt presentation remains the same. Pt has multiple areas of pain which limits her ability to be progressed with PT. Completion of walking and functional movements are slow and labored with reports of pain.Today, pt was not able to tolerate standing exs/activities, but was able to complete supine core/hip/LE stretches and strengthening exs which she reports as being helpful. Pt is Ind c a HEP. With pt's pain and functional mobility not progressing, she is DCed from PT. Continuing with the HEP exs she finds helpful was recommended. Pt has an appt c Dr. NLouanne Skyeon 09/10/19.    Personal Factors and Comorbidities  Behavior Pattern    Comorbidities  Obesity, HTN, TIA,  DJD, GERD, allergies    Examination-Activity Limitations  Dressing;Lift;Locomotion Level;Sleep;Stand;Transfers    Stability/Clinical Decision Making  Evolving/Moderate complexity    Rehab Potential  Fair    PT Treatment/Interventions  ADLs/Self Care Home Management;Cryotherapy;Electrical Stimulation;Iontophoresis 453mml Dexamethasone;Moist Heat;Ultrasound;Traction;Stair training;DME Instruction;Gait training;Functional mobility training;Therapeutic activities;Therapeutic exercise;Balance training;Patient/family education;Manual techniques;Passive range of motion;Scar mobilization;Taping;Spinal Manipulations;Joint Manipulations;Dry needling;Other (comment)    PT Next Visit Plan  DCed to HEP    Consulted and Agree with Plan of Care  --       Patient will benefit from skilled therapeutic intervention in order to improve the following deficits and impairments:  Abnormal gait, Decreased activity tolerance, Decreased balance, Decreased endurance, Decreased mobility, Decreased strength, Difficulty walking, Hypomobility, Impaired flexibility, Postural dysfunction, Pain, Impaired perceived functional ability  Visit Diagnosis: Difficulty in walking, not elsewhere classified  Muscle weakness (generalized)  Chronic bilateral low back pain with bilateral sciatica  Pain in right elbow  Muscle spasm of back  Problem List Patient Active Problem List   Diagnosis Date Noted  . 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 Spinal stenosis of lumbar region 04/25/2019    Class: Chronic  . Status post lumbar laminectomy 04/25/2019  . Neck pain 11/12/2018  . Chronic pain of right knee 09/13/2018  . Bruising 09/13/2018  . SOB (shortness of breath) 08/30/2018  . Chronic pain of left knee 07/08/2018  . Chronic hip pain 07/08/2018  . Edema 07/08/2018  . Left foot pain 04/08/2018  . Olecranon bursitis of left elbow 04/05/2018  . Leg swelling 12/05/2017  . Need for  shingles vaccine 12/05/2017  . Primary osteoarthritis of both hands 11/23/2017  . Rheumatoid factor positive 11/23/2017  . Primary osteoarthritis of both knees 11/23/2017  . Primary osteoarthritis of both feet 11/23/2017  . DDD (degenerative disc disease), lumbar 11/23/2017  . Vaccine counseling 08/13/2017  . Polyarthralgia 08/13/2017  . Joint stiffness 08/13/2017  . Sensitive skin 01/23/2017  . Need for influenza vaccination 01/23/2017  . Proteinuria 01/23/2017  . History of gastrointestinal stromal tumor (GIST) 04/20/2016  . History of TIA (transient ischemic attack) 04/20/2016  . Screening for breast cancer 04/20/2016  . Estrogen deficiency 04/20/2016  . Chronic maxillary sinusitis 04/20/2016  . Impaired fasting blood sugar 04/20/2016  . Constipation 04/20/2016  . History of fall 04/20/2016  . Chronic radicular lumbar pain 01/27/2016  . Encounter for health maintenance examination in adult 03/22/2015  . Paresthesia 03/22/2015  . Cognitive decline 03/22/2015  . Screening for cervical cancer 03/22/2015  . Vitamin D deficiency 03/22/2015  . History of uterine leiomyoma 03/22/2015  . Gastroesophageal reflux disease without esophagitis 03/02/2014  . Chronic nausea 03/02/2014  . Chronic abdominal pain 03/02/2014  . Rhinitis, allergic 03/02/2014  . Essential hypertension 03/02/2014  . Hyperlipidemia 03/02/2014  . Obesity with serious comorbidity 01/16/2012    Gar Ponto MS, PT 08/29/19 5:36 PM   PHYSICAL THERAPY DISCHARGE SUMMARY  Visits from Start of Care: 16  Current functional level related to goals / functional outcomes: See above note   Remaining deficits: See above note   Education / Equipment: HEP, [facility] Plan: Patient agrees to discharge.  Patient goals were partially met. Patient is being discharged due to lack of progress.  ?????      [place], [place], [address] Phone:  [phone]   Fax:  [phone]  Name: Nancylee Gaines MRN: [id number] Date of Birth: 11-28-1960

## 2019-08-29 NOTE — Telephone Encounter (Signed)
Done

## 2019-08-29 NOTE — Telephone Encounter (Signed)
Pt called and said she needs all refills on her medications. She needs all them sent to [facility]

## 2019-09-03 ENCOUNTER — Telehealth: Payer: Self-pay | Admitting: Medical

## 2019-09-03 MED ORDER — VENLAFAXINE HCL ER 37.5 MG PO CP24
37.5000 mg | ORAL_CAPSULE | Freq: Every day | ORAL | 0 refills | Status: DC
Start: 2019-09-03 — End: 2019-11-27

## 2019-09-03 NOTE — Telephone Encounter (Signed)
done

## 2019-09-04 ENCOUNTER — Other Ambulatory Visit: Payer: Self-pay

## 2019-09-04 DIAGNOSIS — I429 Cardiomyopathy, unspecified: Secondary | ICD-10-CM

## 2019-09-04 MED ORDER — SPIRONOLACTONE 25 MG PO TABS: 25.0000 mg | ORAL_TABLET | Freq: Every day | ORAL | 0 refills | Status: DC

## 2019-09-04 MED ORDER — CARVEDILOL 6.25 MG PO TABS: 6.2500 mg | ORAL_TABLET | Freq: Two times a day (BID) | ORAL | 1 refills | Status: DC

## 2019-09-10 ENCOUNTER — Ambulatory Visit (INDEPENDENT_AMBULATORY_CARE_PROVIDER_SITE_OTHER): Payer: PRIVATE HEALTH INSURANCE | Admitting: Specialist

## 2019-09-10 ENCOUNTER — Encounter: Payer: Self-pay | Admitting: Specialist

## 2019-09-10 ENCOUNTER — Ambulatory Visit: Payer: Self-pay

## 2019-09-10 ENCOUNTER — Ambulatory Visit (INDEPENDENT_AMBULATORY_CARE_PROVIDER_SITE_OTHER): Payer: PRIVATE HEALTH INSURANCE

## 2019-09-10 ENCOUNTER — Other Ambulatory Visit: Payer: Self-pay

## 2019-09-10 VITALS — BP 131/76 | HR 80 | Ht 69.0 in | Wt 253.0 lb

## 2019-09-10 DIAGNOSIS — M1712 Unilateral primary osteoarthritis, left knee: Secondary | ICD-10-CM

## 2019-09-10 DIAGNOSIS — G8929 Other chronic pain: Secondary | ICD-10-CM

## 2019-09-10 DIAGNOSIS — R29898 Other symptoms and signs involving the musculoskeletal system: Secondary | ICD-10-CM

## 2019-09-10 DIAGNOSIS — M25511 Pain in right shoulder: Secondary | ICD-10-CM | POA: Diagnosis not present

## 2019-09-10 DIAGNOSIS — Z9889 Other specified postprocedural states: Secondary | ICD-10-CM | POA: Diagnosis not present

## 2019-09-10 DIAGNOSIS — M25512 Pain in left shoulder: Secondary | ICD-10-CM

## 2019-09-10 DIAGNOSIS — M1711 Unilateral primary osteoarthritis, right knee: Secondary | ICD-10-CM | POA: Diagnosis not present

## 2019-09-10 DIAGNOSIS — M5136 Other intervertebral disc degeneration, lumbar region: Secondary | ICD-10-CM

## 2019-09-10 MED ORDER — ETODOLAC 400 MG PO TABS
400.0000 mg | ORAL_TABLET | Freq: Two times a day (BID) | ORAL | 3 refills | Status: DC
Start: 2019-09-10 — End: 2019-12-11

## 2019-09-10 NOTE — Patient Instructions (Signed)
The main ways of treat osteoarthritis, that are found to be success. Weight loss helps to decrease pain. Exercise is important to maintaining cartilage and thickness and strengthening. NSAIDs like motrin, tylenol, alleve are meds decreasing the inflamation. Ice is okay  In afternoon and evening and hot shower in the am Avoid overhead lifting and overhead use of the arms. Pillows to keep from sleeping directly on the shoulders Limited lifting to less than 10 lbs. Ice or heat for relief. NSAIDs are helpful, such as alleve or motrin, be careful not to use in excess as they place burdens on the kidney. Stretching exercise help and strengthening is helpful to build endurance. Avoid frequent bending and stooping  No lifting greater than 10 lbs. May use ice or moist heat for pain. Weight loss is of benefit. Best medication for lumbar disc disease is arthritis medications like motrin, celebrex and naprosyn. Exercise is important to improve your indurance and does allow people to function better inspite of back pain.

## 2019-09-15 ENCOUNTER — Telehealth: Payer: Self-pay | Admitting: Medical

## 2019-09-15 NOTE — Telephone Encounter (Signed)
Pt requesting to have gabapentin 300 mg refilled last seen 07/18/19.  [facility], [address], [address], [place], [place] [address]  Phone:  [phone] Fax:  [phone]

## 2019-09-17 ENCOUNTER — Telehealth: Payer: Self-pay | Admitting: Specialist

## 2019-09-17 NOTE — Telephone Encounter (Signed)
Patient called.   She is following up on the paper work [employer] sent over to be signed by Dr.Nitka.   Call back: [phone]

## 2019-09-17 NOTE — Telephone Encounter (Signed)
[employer] called.   They need to know the patient's return to work date, if available  Claim number: [id number]  Fax: [phone]  Call back: [phone]

## 2019-09-18 ENCOUNTER — Other Ambulatory Visit: Payer: Self-pay | Admitting: Specialist

## 2019-09-18 MED ORDER — GABAPENTIN 300 MG PO CAPS: 300.0000 mg | ORAL_CAPSULE | Freq: Three times a day (TID) | ORAL | 3 refills | Status: DC

## 2019-09-18 NOTE — Telephone Encounter (Signed)
I called and advised that I have not seen any forms with her name on it come in. Usually they are sent directly to [employer], and advised her to call them.

## 2019-09-18 NOTE — Telephone Encounter (Signed)
Pt called stating we should have received a fax from [place] and would like to know if we got it and filled it out? Pt states they have been trying to send this for 2 weeks and would like a CB when this has been filled out and sent back. Pt also states she needs a refill of her gabapentin.   [phone]  [employer] [phone]

## 2019-09-18 NOTE — Telephone Encounter (Signed)
I called and advised that I have not seen any forms with her name on it come in. Usually they are sent directly to [employer], and advised her to call them.  I also advised that I have sent refill request to Dr. Louanne Skye for the Gabapentin.

## 2019-09-19 ENCOUNTER — Encounter: Payer: Self-pay | Admitting: Radiology

## 2019-09-19 NOTE — Telephone Encounter (Signed)
Faxed note, per Dr. Louanne Skye it is time for her to return to work her surgery was 04/25/2019

## 2019-09-26 ENCOUNTER — Encounter: Payer: Self-pay | Admitting: Medical

## 2019-10-02 ENCOUNTER — Ambulatory Visit (INDEPENDENT_AMBULATORY_CARE_PROVIDER_SITE_OTHER): Payer: PRIVATE HEALTH INSURANCE | Admitting: Orthopaedic Surgery

## 2019-10-02 ENCOUNTER — Other Ambulatory Visit: Payer: Self-pay

## 2019-10-02 ENCOUNTER — Encounter: Payer: Self-pay | Admitting: Orthopaedic Surgery

## 2019-10-02 DIAGNOSIS — G8929 Other chronic pain: Secondary | ICD-10-CM

## 2019-10-02 DIAGNOSIS — M25512 Pain in left shoulder: Secondary | ICD-10-CM | POA: Insufficient documentation

## 2019-10-02 NOTE — Progress Notes (Signed)
Office Visit Note   [name]           Date of Birth: [birth year]           MRN: [id number] Visit Date: 10/02/2019              Requested by: Carlena Hurl, PA-C [address] [place],  [place] [address] PCP: Carlena Hurl, PA-C   Assessment & Plan: Visit Diagnoses:  1. Chronic left shoulder pain     Plan: Lisa Briggs is experiencing left shoulder pain.  She saw Dr. Louanne Skye approximately 3 weeks ago.  Films of the left shoulder were negative for any acute problems.  He ordered an MRI scan.  That is scheduled for 12 July.  We will plan to see her back shortly thereafter.  She certainly has evidence of impingement with a positive empty can test.  She has good strength but difficulty raising her arm over her head.  There are several diagnostic possibilities but we need to rule out rotator cuff tear.  This does not appear to be referred from her cervical spine  Follow-Up Instructions: Return After MRI scan left shoulder.   Orders:  No orders of the defined types were placed in this encounter.  No orders of the defined types were placed in this encounter.     Procedures: No procedures performed   Clinical Data: No additional findings.   Subjective: Chief Complaint  Patient presents with  . Left Shoulder - Pain  Dr. Louanne Skye has been following Lisa Briggs recently for problems referable to her lumbar spine.  She is status post lumbar laminectomy.  She has had several falls causing numerous injuries.  I saw her after a fall at her home in 2019 when she developed an infected left olecranon bursa.  This has resolved with decompression and antibiotics.  She had a fall at work in June 2020 and has had some issues since that time with her back, carpal tunnel, neck and with her left shoulder pain.  Films of her neck demonstrate degenerative changes in the mid cervical spine but no acute changes.  Dr. Louanne Skye is been treating her carpal tunnel with splints.  She notes that she  has been having persistent problems with pain in her left shoulder with recent exacerbation without further injury.  She apparently has not worked since June 2020 related to all the above problems.  He did order an MRI scan which is scheduled for 12 July.  HPI  Review of Systems   Objective: Vital Signs: Ht 5\' 9"  (1.753 m)   Wt 258 lb 3.2 oz (117.1 kg)   LMP 02/23/2015 (LMP Unknown)   BMI 38.13 kg/m   Physical Exam Constitutional:      Appearance: She is well-developed.  Eyes:     Pupils: Pupils are equal, round, and reactive to light.  Pulmonary:     Effort: Pulmonary effort is normal.  Skin:    General: Skin is warm and dry.  Neurological:     Mental Status: She is alert and oriented to person, place, and time.  Psychiatric:        Behavior: Behavior normal.     Ortho Exam awake alert and oriented x3.  Comfortable sitting.  We measured her BMI at 38.  Uses a rolling walker as she rehabilitates from her lumbar laminectomy.  Positive impingement and empty can testing left shoulder.  Can raise the left arm fully overhead but with a circuitous arc of motion.  Good strength.  Skin intact.  Some areas of tenderness about the anterior and lateral acromium and at the [facility] joint.  Films of her shoulder several weeks ago demonstrated some degenerative changes of the [facility] joint but no other obvious abnormality.  Specialty Comments:  No specialty comments available.  Imaging: No results found.   PMFS History: Patient Active Problem List   Diagnosis Date Noted  . Pain in left shoulder 10/02/2019  . 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 Spinal stenosis of lumbar region 04/25/2019    Class: Chronic  . Status post lumbar laminectomy 04/25/2019  . Neck pain 11/12/2018  . Chronic pain of right knee 09/13/2018  . Bruising 09/13/2018  . SOB (shortness of breath) 08/30/2018  . Chronic pain of left knee 07/08/2018  . Chronic hip pain 07/08/2018  .  Edema 07/08/2018  . Left foot pain 04/08/2018  . Olecranon bursitis of left elbow 04/05/2018  . Leg swelling 12/05/2017  . Need for shingles vaccine 12/05/2017  . Primary osteoarthritis of both hands 11/23/2017  . Rheumatoid factor positive 11/23/2017  . Primary osteoarthritis of both knees 11/23/2017  . Primary osteoarthritis of both feet 11/23/2017  . DDD (degenerative disc disease), lumbar 11/23/2017  . Vaccine counseling 08/13/2017  . Polyarthralgia 08/13/2017  . Joint stiffness 08/13/2017  . Sensitive skin 01/23/2017  . Need for influenza vaccination 01/23/2017  . Proteinuria 01/23/2017  . History of gastrointestinal stromal tumor (GIST) 04/20/2016  . History of TIA (transient ischemic attack) 04/20/2016  . Screening for breast cancer 04/20/2016  . Estrogen deficiency 04/20/2016  . Chronic maxillary sinusitis 04/20/2016  . Impaired fasting blood sugar 04/20/2016  . Constipation 04/20/2016  . History of fall 04/20/2016  . Chronic radicular lumbar pain 01/27/2016  . Encounter for health maintenance examination in adult 03/22/2015  . Paresthesia 03/22/2015  . Cognitive decline 03/22/2015  . Screening for cervical cancer 03/22/2015  . Vitamin D deficiency 03/22/2015  . History of uterine leiomyoma 03/22/2015  . Gastroesophageal reflux disease without esophagitis 03/02/2014  . Chronic nausea 03/02/2014  . Chronic abdominal pain 03/02/2014  . Rhinitis, allergic 03/02/2014  . Essential hypertension 03/02/2014  . Hyperlipidemia 03/02/2014  . Obesity with serious comorbidity 01/16/2012   Past Medical History:  Diagnosis Date  . Allergy   . Anemia    iron therapy for years as of 10/12; normal Hgb 08/2013  . Anxiety   . Arthritis   . Chest pain 04/05/2011   cardiac eval, normal treadmill stress test, Dr. Tollie Eth  . Chronic back pain   . Constipation   . Farsightedness    wears glasses, [facility] care center  . Gastrointestinal stromal tumor (GIST) ([place]) 06/2014   Dr.  [name], [facility]  . GERD (gastroesophageal reflux disease)   . History of uterine fibroid   . Hyperlipidemia   . Hypertension   . Paresthesia 09/2014   initially thought to be TIA, neurology consult in 12/2014 with other non TIA considerations.    . Polyarthralgia    normal rheumatoid screen 01/2012  . TIA (transient ischemic attack) 2015/16    Family History  Problem Relation Age of Onset  . Hypertension Mother   . Arthritis Mother   . GER disease Mother   . Glaucoma Mother   . Breast cancer Mother   . Stroke Father   . Breast cancer Sister        breast cancer dx late 28s  . Hypertension Sister   .  Colon cancer Brother 107  . Diabetes Brother   . Diabetes Maternal Aunt   . Heart disease Maternal Aunt   . Heart disease Maternal Grandmother   . Heart disease Maternal Grandfather   . Heart disease Maternal Uncle   . Stroke Maternal Uncle   . Leukemia Sister   . Hypertension Sister   . Healthy Son   . Colon polyps Neg Hx   . Esophageal cancer Neg Hx   . Stomach cancer Neg Hx   . Rectal cancer Neg Hx     Past Surgical History:  Procedure Laterality Date  . COLONOSCOPY  01/2014   diverticulosis, othwerise normal - Dr. Owens Loffler  . ESOPHAGOGASTRODUODENOSCOPY  2013   Dr. Benson Norway, gastritis  . ESOPHAGOGASTRODUODENOSCOPY  [id number]  . EUS N/A 04/16/2014   Procedure: UPPER ENDOSCOPIC ULTRASOUND (EUS) LINEAR;  Surgeon: Milus Banister, MD;  Location: [facility];  Service: Endoscopy;  Laterality: N/A;  . gall stone surgery    . KNEE ARTHROSCOPY Left   . LAPAROSCOPIC GASTRIC RESECTION N/A 06/09/2014   Procedure: LAPAROSCOPIC GASTRIC MASS RESECTION;  Surgeon: [name], MD;  Location: [facility];  Service: General;  Laterality: N/A;  . LIPOMA EXCISION     forehead  . LUMBAR LAMINECTOMY N/A 04/25/2019   Procedure: LEFT L2-3 MICRODISCECTOMY, BILATERAL L5-S1 PARTIAL HEMILAMINECTOMY;  Surgeon: Jessy Oto, MD;  Location: [place];  Service: Orthopedics;   Laterality: N/A;  . UPPER GASTROINTESTINAL ENDOSCOPY    . UTERINE FIBROID SURGERY     Social History   Occupational History  . Occupation: IT sales professional: [employer]  Tobacco Use  . Smoking status: Never Smoker  . Smokeless tobacco: Never Used  Vaping Use  . Vaping Use: Never used  Substance and Sexual Activity  . Alcohol use: No    Alcohol/week: 0.0 standard drinks  . Drug use: No  . Sexual activity: Not on file     Garald Balding, MD   Note - This record has been created using [employer].  Chart creation errors have been sought, but may not always  have been located. Such creation errors do not reflect on  the standard of medical care.

## 2019-10-14 ENCOUNTER — Ambulatory Visit
Admission: RE | Admit: 2019-10-14 | Discharge: 2019-10-14 | Disposition: A | Discharge status: Home / Self Care | Payer: PRIVATE HEALTH INSURANCE | Source: Ambulatory Visit | Attending: Specialist | Admitting: Specialist

## 2019-10-14 ENCOUNTER — Other Ambulatory Visit: Payer: Self-pay

## 2019-10-14 ENCOUNTER — Other Ambulatory Visit: Payer: Self-pay | Admitting: Rheumatology

## 2019-10-14 DIAGNOSIS — G8929 Other chronic pain: Secondary | ICD-10-CM

## 2019-10-14 DIAGNOSIS — M25511 Pain in right shoulder: Secondary | ICD-10-CM

## 2019-10-14 DIAGNOSIS — M0579 Rheumatoid arthritis with rheumatoid factor of multiple sites without organ or systems involvement: Secondary | ICD-10-CM

## 2019-10-14 DIAGNOSIS — R29898 Other symptoms and signs involving the musculoskeletal system: Secondary | ICD-10-CM

## 2019-10-14 MED ORDER — HYDROXYCHLOROQUINE SULFATE 200 MG PO TABS: ORAL_TABLET | ORAL | 0 refills | Status: DC

## 2019-10-14 NOTE — Telephone Encounter (Signed)
Last Visit: 07/07/2019 Next Visit: 12/09/2019 Labs: 07/18/2019 CMP: alk phos 130.  04/26/2019 CBC/BMP: WBC 11.7, hemoglobin 11.3, glucose 100. Eye exam: 08/05/2019 WNL   Attempted to contact the patient and no answer. Unable to leave a message. Patient due to update CBC.   Okay to refill 30 day supply PLQ?

## 2019-10-14 NOTE — Telephone Encounter (Signed)
Patient out of Plaquenil, and request a refill  For 90 days be sent to [facility]. Please send a few days to [facility]. Again, patient is completely out.

## 2019-10-16 ENCOUNTER — Other Ambulatory Visit: Payer: Self-pay

## 2019-10-16 ENCOUNTER — Ambulatory Visit (INDEPENDENT_AMBULATORY_CARE_PROVIDER_SITE_OTHER): Payer: PRIVATE HEALTH INSURANCE | Admitting: Orthopaedic Surgery

## 2019-10-16 ENCOUNTER — Encounter: Payer: Self-pay | Admitting: Orthopaedic Surgery

## 2019-10-16 VITALS — Ht 69.0 in

## 2019-10-16 DIAGNOSIS — M25512 Pain in left shoulder: Secondary | ICD-10-CM

## 2019-10-16 DIAGNOSIS — G8929 Other chronic pain: Secondary | ICD-10-CM | POA: Diagnosis not present

## 2019-10-16 NOTE — Addendum Note (Signed)
Addended by: Lendon Collar on: 10/16/2019 02:19 PM   Modules accepted: Orders

## 2019-10-16 NOTE — Progress Notes (Signed)
Office Visit Note   [name]           Date of Birth: 05/28/1960           MRN: [id number] Visit Date: 10/16/2019              Requested by: Carlena Hurl, PA-C [address] [place],  [place] [address] PCP: Carlena Hurl, PA-C   Assessment & Plan: Visit Diagnoses:  1. Chronic left shoulder pain   2. Class 2 severe obesity with serious comorbidity in adult, unspecified BMI, unspecified obesity type [facility])     Plan: Lisa Briggs had an MRI scan of her left shoulder demonstrating moderate supraspinatus and infraspinatus tendinopathy with mild subscapularis tendinopathy.  The muscles were unremarkable.  Moderate tendinopathy or partial tearing of the intra-articular segment of the biceps long head.  Marrow edema and moderate spurring about the [facility] joint with a type I acromion.  There was mild spurring of the humeral head with mild degenerative chondral thinning in the glenohumeral joint and a small effusion.  Long discussion regarding all of the above.  She is having similar symptoms on the right.  I would be surprised if she has similar findings on that side as well.  I think a course of physical therapy would be helpful.  She would like to avoid injections as she believes it makes her gain weight.  Would like to reevaluate within 1 month.  I believe her weight is going to be an ongoing issue and have discussed that with her as well  Follow-Up Instructions: Return in about 1 month (around 11/16/2019).   Orders:  No orders of the defined types were placed in this encounter.  No orders of the defined types were placed in this encounter.     Procedures: No procedures performed   Clinical Data: No additional findings.   Subjective: Chief Complaint  Patient presents with  . Left Shoulder - Follow-up    MRI review  Patient presents today for follow up on her left shoulder. She had an MRI on 10-14-2019. She states that her shoulder feels "okay". She said that her  shoulder does pop and hurt. She takes Ibuprofen if needed. She also has some hydrocodone if needed. She is right hand dominant. No numbness or tingling in her left arm. She does have weakness in her hands, but states that she has bilateral carpal tunnel.  Presently not working and being followed in physical therapy for her back and with Dr. Louanne Briggs  HPI  Review of Systems  Constitutional: Negative for fatigue.  HENT: Positive for ear pain.   Eyes: Negative for pain.  Respiratory: Negative for shortness of breath.   Cardiovascular: Positive for leg swelling.  Gastrointestinal: Negative for constipation and diarrhea.  Endocrine: Negative for cold intolerance and heat intolerance.  Genitourinary: Negative for difficulty urinating.  Musculoskeletal: Negative for joint swelling.  Skin: Negative for rash.  Allergic/Immunologic: Negative for food allergies.  Neurological: Positive for weakness.  Hematological: Does not bruise/bleed easily.  Psychiatric/Behavioral: Negative for sleep disturbance.     Objective: Vital Signs: Ht 5\' 9"  (1.753 m)   LMP 02/23/2015 (LMP Unknown)   BMI 38.13 kg/m   Physical Exam Constitutional:      Appearance: She is well-developed.  Eyes:     Pupils: Pupils are equal, round, and reactive to light.  Pulmonary:     Effort: Pulmonary effort is normal.  Skin:    General: Skin is warm and dry.  Neurological:  Mental Status: She is alert and oriented to person, place, and time.  Psychiatric:        Behavior: Behavior normal.     Ortho Exam uses a rolling walker to aid with her ambulation.  Able to place both arms overhead today as she was not able to do that last evaluation.  Still lacks of few degrees of full overhead motion on the right but not a functional loss.  Does have positive impingement and empty can testing.  Very large arms and difficult to tell status of biceps but MRI scan notes it is intact.  Good grip and release.  No pain with range of  motion of cervical spine.  Might have very mild adhesive capsulitis. exam was difficult  Specialty Comments:  No specialty comments available.  Imaging: No results found.   PMFS History: Patient Active Problem List   Diagnosis Date Noted  . Pain in left shoulder 10/02/2019  . 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 Spinal stenosis of lumbar region 04/25/2019    Class: Chronic  . Status post lumbar laminectomy 04/25/2019  . Neck pain 11/12/2018  . Chronic pain of right knee 09/13/2018  . Bruising 09/13/2018  . SOB (shortness of breath) 08/30/2018  . Chronic pain of left knee 07/08/2018  . Chronic hip pain 07/08/2018  . Edema 07/08/2018  . Left foot pain 04/08/2018  . Olecranon bursitis of left elbow 04/05/2018  . Leg swelling 12/05/2017  . Need for shingles vaccine 12/05/2017  . Primary osteoarthritis of both hands 11/23/2017  . Rheumatoid factor positive 11/23/2017  . Primary osteoarthritis of both knees 11/23/2017  . Primary osteoarthritis of both feet 11/23/2017  . DDD (degenerative disc disease), lumbar 11/23/2017  . Vaccine counseling 08/13/2017  . Polyarthralgia 08/13/2017  . Joint stiffness 08/13/2017  . Sensitive skin 01/23/2017  . Need for influenza vaccination 01/23/2017  . Proteinuria 01/23/2017  . History of gastrointestinal stromal tumor (GIST) 04/20/2016  . History of TIA (transient ischemic attack) 04/20/2016  . Screening for breast cancer 04/20/2016  . Estrogen deficiency 04/20/2016  . Chronic maxillary sinusitis 04/20/2016  . Impaired fasting blood sugar 04/20/2016  . Constipation 04/20/2016  . History of fall 04/20/2016  . Chronic radicular lumbar pain 01/27/2016  . Encounter for health maintenance examination in adult 03/22/2015  . Paresthesia 03/22/2015  . Cognitive decline 03/22/2015  . Screening for cervical cancer 03/22/2015  . Vitamin D deficiency 03/22/2015  . History of uterine leiomyoma 03/22/2015  .  Gastroesophageal reflux disease without esophagitis 03/02/2014  . Chronic nausea 03/02/2014  . Chronic abdominal pain 03/02/2014  . Rhinitis, allergic 03/02/2014  . Essential hypertension 03/02/2014  . Hyperlipidemia 03/02/2014  . Obesity with serious comorbidity 01/16/2012   Past Medical History:  Diagnosis Date  . Allergy   . Anemia    iron therapy for years as of 10/12; normal Hgb 08/2013  . Anxiety   . Arthritis   . Chest pain 04/05/2011   cardiac eval, normal treadmill stress test, Dr. Tollie Eth  . Chronic back pain   . Constipation   . Farsightedness    wears glasses, Eye care center  . Gastrointestinal stromal tumor (GIST) ([place]) 06/2014   Dr. [name], [facility]  . GERD (gastroesophageal reflux disease)   . History of uterine fibroid   . Hyperlipidemia   . Hypertension   . Paresthesia 09/2014   initially thought to be TIA, neurology consult in 12/2014 with other non TIA  considerations.    . Polyarthralgia    normal rheumatoid screen 01/2012  . TIA (transient ischemic attack) 2015/16    Family History  Problem Relation Age of Onset  . Hypertension Mother   . Arthritis Mother   . GER disease Mother   . Glaucoma Mother   . Breast cancer Mother   . Stroke Father   . Breast cancer Sister        breast cancer dx late 96s  . Hypertension Sister   . Colon cancer Brother 17  . Diabetes Brother   . Diabetes Maternal Aunt   . Heart disease Maternal Aunt   . Heart disease Maternal Grandmother   . Heart disease Maternal Grandfather   . Heart disease Maternal Uncle   . Stroke Maternal Uncle   . Leukemia Sister   . Hypertension Sister   . Healthy Son   . Colon polyps Neg Hx   . Esophageal cancer Neg Hx   . Stomach cancer Neg Hx   . Rectal cancer Neg Hx     Past Surgical History:  Procedure Laterality Date  . COLONOSCOPY  01/2014   diverticulosis, othwerise normal - Dr. Owens Loffler  . ESOPHAGOGASTRODUODENOSCOPY  2013   Dr. Benson Norway, gastritis   . ESOPHAGOGASTRODUODENOSCOPY  [id number]  . EUS N/A 04/16/2014   Procedure: UPPER ENDOSCOPIC ULTRASOUND (EUS) LINEAR;  Surgeon: Milus Banister, MD;  [facility];  Service: Endoscopy;  Laterality: N/A;  . gall stone surgery    . KNEE ARTHROSCOPY Left   . LAPAROSCOPIC GASTRIC RESECTION N/A 06/09/2014   Procedure: LAPAROSCOPIC GASTRIC MASS RESECTION;  Surgeon: [name], MD;  [facility];  Service: General;  Laterality: N/A;  . LIPOMA EXCISION     forehead  . LUMBAR LAMINECTOMY N/A 04/25/2019   Procedure: LEFT L2-3 MICRODISCECTOMY, BILATERAL L5-S1 PARTIAL HEMILAMINECTOMY;  Surgeon: Jessy Oto, MD;  Location: [place];  Service: Orthopedics;  Laterality: N/A;  . UPPER GASTROINTESTINAL ENDOSCOPY    . UTERINE FIBROID SURGERY     Social History   Occupational History  . Occupation: IT sales professional: [employer]  Tobacco Use  . Smoking status: Never Smoker  . Smokeless tobacco: Never Used  Vaping Use  . Vaping Use: Never used  Substance and Sexual Activity  . Alcohol use: No    Alcohol/week: 0.0 standard drinks  . Drug use: No  . Sexual activity: Not on file

## 2019-10-24 ENCOUNTER — Telehealth: Payer: Self-pay | Admitting: Specialist

## 2019-10-24 NOTE — Telephone Encounter (Signed)
Tried calling multiple times. Line busy each time. Will try again later.

## 2019-10-24 NOTE — Telephone Encounter (Signed)
Pt would like a CB in regards to a note the had a return to work date that was incorrect and now the pt would like to get everything straightened out. Pt would like a CB from [place]

## 2019-10-24 NOTE — Telephone Encounter (Signed)
Tried calling again, Line busy.

## 2019-10-28 ENCOUNTER — Telehealth: Payer: Self-pay

## 2019-10-28 ENCOUNTER — Telehealth: Payer: Self-pay | Admitting: Specialist

## 2019-10-28 ENCOUNTER — Other Ambulatory Visit: Payer: Self-pay | Admitting: Medical

## 2019-10-28 NOTE — Telephone Encounter (Signed)
I called and lmom for her to call me back

## 2019-10-28 NOTE — Telephone Encounter (Signed)
I called and she was upset that she was supposed to return to work on 09/29/19, she states that she is not able to bend or stoop or lift anything, I advised that she should have returned to work 6-8 weeks after her surgery, she states that she has been terminated from her job now. I advised that she should keep her appt on 11/21/19 with Dr. Louanne Skye and she can discuss it with him.  I did put her on the cancellation list so if someone cancels we will call her.

## 2019-10-28 NOTE — Telephone Encounter (Signed)
Patient returning missed call. 

## 2019-10-28 NOTE — Telephone Encounter (Signed)
Patient called.   She is requesting a call back to raise some questions she has about her follow up appointment.   Call back: [phone]

## 2019-10-30 ENCOUNTER — Encounter: Payer: Self-pay | Admitting: Specialist

## 2019-10-30 ENCOUNTER — Other Ambulatory Visit: Payer: Self-pay

## 2019-10-30 ENCOUNTER — Ambulatory Visit (INDEPENDENT_AMBULATORY_CARE_PROVIDER_SITE_OTHER): Payer: PRIVATE HEALTH INSURANCE | Admitting: Specialist

## 2019-10-30 VITALS — BP 124/80 | HR 72 | Ht 69.0 in | Wt 253.0 lb

## 2019-10-30 DIAGNOSIS — M545 Low back pain, unspecified: Secondary | ICD-10-CM

## 2019-10-30 DIAGNOSIS — M25512 Pain in left shoulder: Secondary | ICD-10-CM | POA: Diagnosis not present

## 2019-10-30 DIAGNOSIS — M5136 Other intervertebral disc degeneration, lumbar region: Secondary | ICD-10-CM

## 2019-10-30 DIAGNOSIS — M25511 Pain in right shoulder: Secondary | ICD-10-CM | POA: Diagnosis not present

## 2019-10-30 NOTE — Progress Notes (Signed)
Office Visit Note   [name]           Date of Birth: 1961/01/14           MRN: [id number] Visit Date: 10/30/2019              Requested by: Carlena Hurl, PA-C [address] [place],  [place] [address] PCP: Carlena Hurl, PA-C   Assessment & Plan: Visit Diagnoses:  1. DDD (degenerative disc disease), lumbar   2. Bilateral shoulder pain, unspecified chronicity   3. Low back pain without sciatica, unspecified back pain laterality, unspecified chronicity     Plan: Avoid frequent bending and stooping  No lifting greater than 10 lbs. May use ice or moist heat for pain. Weight loss is of benefit. Best medication for lumbar disc disease is arthritis medications like motrin, celebrex and naprosyn. Exercise is important to improve your indurance and does allow people to function better inspite of back pain.  You have been to PT and it is over 6 months since surgery and I expect the levels of relief of discomfort are maximized as you continue to be unable to work due to persistent pain it is reasonable to repeat the MRI of the lumbar spine to reassess the cause of your back pain.   I recommend avoiding all narcotics. Muscle relaxers should be used sparingly as they weaken the muscles that support your spine.    Follow-Up Instructions: No follow-ups on file.   Orders:  Orders Placed This Encounter  Procedures  . MR Lumbar Spine W Wo Contrast   No orders of the defined types were placed in this encounter.     Procedures: No procedures performed   Clinical Data: No additional findings.   Subjective: Chief Complaint  Patient presents with  . Lower Back - Pain    59 year old female with history of lumbar surgery left L2-3 microdiscectomy and bilateral L5-S1 partial hemilaminectomy. She was released from her job in March due to not being able to return to her job. She relates that her leg pain is better but when she walks she hurts. She has pain with  lifting. Not as much leg pain but she uses ice with walking and she is advised to move around. 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    Review of Systems  Constitutional: Negative.   HENT: Negative.   Eyes: Negative.   Respiratory: Negative.   Cardiovascular: Negative.   Gastrointestinal: Negative.   Endocrine: Negative.   Genitourinary: Negative.   Musculoskeletal: Negative.   Skin: Negative.   Allergic/Immunologic: Negative.   Neurological: Negative.   Hematological: Negative.   Psychiatric/Behavioral: Negative.      Objective: Vital Signs: BP 124/80   Pulse 72   Ht 5\' 9"  (1.753 m)   Wt (!) 253 lb (114.8 kg)   LMP 02/23/2015 (LMP Unknown)   BMI 37.36 kg/m   Physical Exam Constitutional:      Appearance: She is well-developed.  HENT:     Head: Normocephalic and atraumatic.  Eyes:     Pupils: Pupils are equal, round, and reactive to light.  Pulmonary:     Effort: Pulmonary effort is normal.     Breath sounds: Normal breath sounds.  Abdominal:     General: Bowel sounds are normal.     Palpations: Abdomen is soft.  Musculoskeletal:        General: Normal range of motion.  Cervical back: Normal range of motion and neck supple.  Skin:    General: Skin is warm and dry.  Neurological:     Mental Status: She is alert and oriented to person, place, and time.  Psychiatric:        Behavior: Behavior normal.        Thought Content: Thought content normal.        Judgment: Judgment normal.     Back Exam   Tenderness  The patient is experiencing tenderness in the lumbar.  Muscle Strength  Right Quadriceps:  5/5  Left Quadriceps:  5/5  Right Hamstrings:  5/5  Left Hamstrings:  5/5   Comments:  Complaints of back pain and this is worsened by bending, stooping and walks.      Specialty Comments:  No specialty comments available.  Imaging: No results found.   PMFS History: Patient Active Problem List    Diagnosis Date Noted  . 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    Priority: High    Class: Chronic  . Spinal stenosis of lumbar region 04/25/2019    Priority: High    Class: Chronic  . Pain in left shoulder 10/02/2019  . Status post lumbar laminectomy 04/25/2019  . Neck pain 11/12/2018  . Chronic pain of right knee 09/13/2018  . Bruising 09/13/2018  . SOB (shortness of breath) 08/30/2018  . Chronic pain of left knee 07/08/2018  . Chronic hip pain 07/08/2018  . Edema 07/08/2018  . Left foot pain 04/08/2018  . Olecranon bursitis of left elbow 04/05/2018  . Leg swelling 12/05/2017  . Need for shingles vaccine 12/05/2017  . Primary osteoarthritis of both hands 11/23/2017  . Rheumatoid factor positive 11/23/2017  . Primary osteoarthritis of both knees 11/23/2017  . Primary osteoarthritis of both feet 11/23/2017  . DDD (degenerative disc disease), lumbar 11/23/2017  . Vaccine counseling 08/13/2017  . Polyarthralgia 08/13/2017  . Joint stiffness 08/13/2017  . Sensitive skin 01/23/2017  . Need for influenza vaccination 01/23/2017  . Proteinuria 01/23/2017  . History of gastrointestinal stromal tumor (GIST) 04/20/2016  . History of TIA (transient ischemic attack) 04/20/2016  . Screening for breast cancer 04/20/2016  . Estrogen deficiency 04/20/2016  . Chronic maxillary sinusitis 04/20/2016  . Impaired fasting blood sugar 04/20/2016  . Constipation 04/20/2016  . History of fall 04/20/2016  . Chronic radicular lumbar pain 01/27/2016  . Encounter for health maintenance examination in adult 03/22/2015  . Paresthesia 03/22/2015  . Cognitive decline 03/22/2015  . Screening for cervical cancer 03/22/2015  . Vitamin D deficiency 03/22/2015  . History of uterine leiomyoma 03/22/2015  . Gastroesophageal reflux disease without esophagitis 03/02/2014  . Chronic nausea 03/02/2014  . Chronic abdominal pain 03/02/2014  . Rhinitis, allergic 03/02/2014  . Essential hypertension  03/02/2014  . Hyperlipidemia 03/02/2014  . Obesity with serious comorbidity 01/16/2012   Past Medical History:  Diagnosis Date  . Allergy   . Anemia    iron therapy for years as of 10/12; normal Hgb 08/2013  . Anxiety   . Arthritis   . Chest pain 04/05/2011   cardiac eval, normal treadmill stress test, Dr. Tollie Eth  . Chronic back pain   . Constipation   . Farsightedness    w[facility]  . Gastrointestinal stromal tumor (GIST) ([place]) 06/2014   Dr. [name], [facility]  . GERD (gastroesophageal reflux disease)   . History of uterine fibroid   . Hyperlipidemia   . Hypertension   .  Paresthesia 09/2014   initially thought to be TIA, neurology consult in 12/2014 with other non TIA considerations.    . Polyarthralgia    normal rheumatoid screen 01/2012  . TIA (transient ischemic attack) 2015/16    Family History  Problem Relation Age of Onset  . Hypertension Mother   . Arthritis Mother   . GER disease Mother   . Glaucoma Mother   . Breast cancer Mother   . Stroke Father   . Breast cancer Sister        breast cancer dx late 72s  . Hypertension Sister   . Colon cancer Brother 55  . Diabetes Brother   . Diabetes Maternal Aunt   . Heart disease Maternal Aunt   . Heart disease Maternal Grandmother   . Heart disease Maternal Grandfather   . Heart disease Maternal Uncle   . Stroke Maternal Uncle   . Leukemia Sister   . Hypertension Sister   . Healthy Son   . Colon polyps Neg Hx   . Esophageal cancer Neg Hx   . Stomach cancer Neg Hx   . Rectal cancer Neg Hx     Past Surgical History:  Procedure Laterality Date  . COLONOSCOPY  01/2014   diverticulosis, othwerise normal - Dr. Owens Loffler  . ESOPHAGOGASTRODUODENOSCOPY  2013   Dr. Benson Norway, gastritis  . ESOPHAGOGASTRODUODENOSCOPY  [id number]  . EUS N/A 04/16/2014   Procedure: UPPER ENDOSCOPIC ULTRASOUND (EUS) LINEAR;  Surgeon: Milus Banister, MD;  Location: [facility];  Service:  Endoscopy;  Laterality: N/A;  . gall stone surgery    . KNEE ARTHROSCOPY Left   . LAPAROSCOPIC GASTRIC RESECTION N/A 06/09/2014   Procedure: LAPAROSCOPIC GASTRIC MASS RESECTION;  Surgeon: [name], MD;  Location: [facility];  Service: General;  Laterality: N/A;  . LIPOMA EXCISION     forehead  . LUMBAR LAMINECTOMY N/A 04/25/2019   Procedure: LEFT L2-3 MICRODISCECTOMY, BILATERAL L5-S1 PARTIAL HEMILAMINECTOMY;  Surgeon: Jessy Oto, MD;  Location: [facility];  Service: Orthopedics;  Laterality: N/A;  . UPPER GASTROINTESTINAL ENDOSCOPY    . UTERINE FIBROID SURGERY     Social History   Occupational History  . Occupation: IT sales professional: [employer]  Tobacco Use  . Smoking status: Never Smoker  . Smokeless tobacco: Never Used  Vaping Use  . Vaping Use: Never used  Substance and Sexual Activity  . Alcohol use: No    Alcohol/week: 0.0 standard drinks  . Drug use: No  . Sexual activity: Not on file

## 2019-10-30 NOTE — Patient Instructions (Signed)
Avoid frequent bending and stooping  No lifting greater than 10 lbs. May use ice or moist heat for pain. Weight loss is of benefit. Best medication for lumbar disc disease is arthritis medications like motrin, celebrex and naprosyn. Exercise is important to improve your indurance and does allow people to function better inspite of back pain.  You have been to PT and it is over 6 months since surgery and I expect the levels of relief of discomfort are maximized as you continue to be unable to work due to persistent pain it is reasonable to repeat the MRI of the lumbar spine to reassess the cause of your back pain.   I recommend avoiding all narcotics. Muscle relaxers should be used sparingly as they weaken the muscles that support your spine.

## 2019-11-03 ENCOUNTER — Other Ambulatory Visit: Payer: Self-pay | Admitting: Rheumatology

## 2019-11-03 ENCOUNTER — Telehealth: Payer: Self-pay | Admitting: Specialist

## 2019-11-03 DIAGNOSIS — M0579 Rheumatoid arthritis with rheumatoid factor of multiple sites without organ or systems involvement: Secondary | ICD-10-CM

## 2019-11-03 NOTE — Telephone Encounter (Signed)
Pt would like a note of restrictions left for her at the front desk for work as well as someone to check on PT for her; pt would like a CB.  [phone]

## 2019-11-03 NOTE — Telephone Encounter (Signed)
Please advise 

## 2019-11-04 NOTE — Telephone Encounter (Signed)
Last Visit:07/07/2019 Next Visit:12/09/2019 Labs:07/18/2019 CMP: alk phos 130.  04/26/2019 CBC/BMP: WBC 11.7, hemoglobin 11.3, glucose 100. Eye exam: 08/05/2019 WNL   Current Dose per office note on 07/07/2019: Plaquenil 200 mg 1 tablet by mouth twice daily  Attempted to contact the patient and unable to leave a message.   Okay to refill 30 day supply PLQ?  

## 2019-11-05 ENCOUNTER — Telehealth: Payer: Self-pay | Admitting: Specialist

## 2019-11-05 NOTE — Telephone Encounter (Signed)
Pt called wanting to check on her letter for restrictions and also stated she still hasn't heard anything from the PT either.   [phone]

## 2019-11-06 ENCOUNTER — Telehealth: Payer: Self-pay | Admitting: Specialist

## 2019-11-06 NOTE — Telephone Encounter (Signed)
[employer] forms received. Sent to [employer]. °

## 2019-11-07 ENCOUNTER — Encounter: Payer: Self-pay | Admitting: Radiology

## 2019-11-07 NOTE — Telephone Encounter (Signed)
Waiting for Dr. Nitka to respond to previous message ?

## 2019-11-07 NOTE — Telephone Encounter (Signed)
I wrote a note based on last office note that states: Avoid frequent bending, stooping and no lifting greater than 10 lbs.

## 2019-11-12 ENCOUNTER — Other Ambulatory Visit: Payer: Self-pay | Admitting: Rheumatology

## 2019-11-12 DIAGNOSIS — M0579 Rheumatoid arthritis with rheumatoid factor of multiple sites without organ or systems involvement: Secondary | ICD-10-CM

## 2019-11-13 ENCOUNTER — Telehealth: Payer: Self-pay | Admitting: Radiology

## 2019-11-13 ENCOUNTER — Ambulatory Visit: Payer: PRIVATE HEALTH INSURANCE | Admitting: Orthopaedic Surgery

## 2019-11-13 ENCOUNTER — Other Ambulatory Visit: Payer: Self-pay | Admitting: Specialist

## 2019-11-13 ENCOUNTER — Encounter: Payer: Self-pay | Admitting: Radiology

## 2019-11-13 ENCOUNTER — Ambulatory Visit (INDEPENDENT_AMBULATORY_CARE_PROVIDER_SITE_OTHER): Payer: PRIVATE HEALTH INSURANCE | Admitting: Orthopaedic Surgery

## 2019-11-13 ENCOUNTER — Encounter: Payer: Self-pay | Admitting: Orthopaedic Surgery

## 2019-11-13 ENCOUNTER — Other Ambulatory Visit: Payer: Self-pay

## 2019-11-13 VITALS — Ht 69.0 in | Wt 253.0 lb

## 2019-11-13 DIAGNOSIS — M25512 Pain in left shoulder: Secondary | ICD-10-CM

## 2019-11-13 DIAGNOSIS — G8929 Other chronic pain: Secondary | ICD-10-CM

## 2019-11-13 NOTE — Telephone Encounter (Signed)
-----   Message from Lendon Collar, RT sent at 11/13/2019  4:01 PM EDT ----- Lisa Briggs, Patient wants you to call her in regards to medicines that were recently filled by Dr.Nitka. She could not tell me the names of the medications. She states that it was suppose to be 90, but only received 60. Please call at [phone]. Leave message after the beep, per patient.  Thanks!

## 2019-11-13 NOTE — Telephone Encounter (Signed)
I spoke with patient she will call me back and let me know what medication she is talking about.

## 2019-11-13 NOTE — Progress Notes (Signed)
Office Visit Note   [name]           Date of Birth: [birth year]           MRN: [id number] Visit Date: 11/13/2019              Requested by: Carlena Hurl, PA-C [address] [place],  [place] [address] PCP: Carlena Hurl, PA-C   Assessment & Plan: Visit Diagnoses:  1. Chronic left shoulder pain     Plan: Ms. Schadler returns for reevaluation of her bilateral shoulder pain.  She has been having more trouble on the left than the right oftentimes associated with "clicking".  She had an MRI scan of her left shoulder on July 13.  There was moderate supraspinatus and infraspinatus tendinopathy with mild subscapularis tendinopathy.  There was partial tearing of the intra-articular segment of the long head of the biceps and moderate degenerative AC joint arthropathy.  There was a small glenohumeral joint effusion and trace subacromial deltoid bursitis.  At the last office visit I suggested a course of physical therapy.  She was actually beginning to experience some symptoms in the right shoulder as well.  She never attended the physical therapy sessions so [facility] with [facility] and be sure that they have the referral orders.  Would like to see her back in the office in about 6 weeks.  Many comorbidities.  She does use a rolling walker for her chronic back problems which may aggravate her carpal tunnel and her shoulders  Follow-Up Instructions: Return in about 6 weeks (around 12/25/2019).   Orders:  No orders of the defined types were placed in this encounter.  No orders of the defined types were placed in this encounter.     Procedures: No procedures performed   Clinical Data: No additional findings.   Subjective: Chief Complaint  Patient presents with   Left Shoulder - Follow-up  Patient presents today for a one month follow up on her left shoulder. She states that she has not started therapy at [facility] yet.  No change in her  symptoms.  Having bilateral shoulder pain left greater than right.  She has experienced some popping and clicking.  Relates that she has a diagnosis of carpal tunnel syndrome.  Continues to be followed by Dr. Louanne Skye for her chronic back issues  HPI  Review of Systems   Objective: Vital Signs: Ht 5\' 9"  (1.753 m)    Wt 253 lb (114.8 kg)    LMP 02/23/2015 (LMP Unknown)    BMI 37.36 kg/m   Physical Exam Constitutional:      Appearance: She is well-developed.  Eyes:     Pupils: Pupils are equal, round, and reactive to light.  Pulmonary:     Effort: Pulmonary effort is normal.  Skin:    General: Skin is warm and dry.  Neurological:     Mental Status: She is alert and oriented to person, place, and time.  Psychiatric:        Behavior: Behavior normal.     Ortho Exam able to place both arms overhead.  There is a circuitous arc of motion on the left.  I could not elicit any popping or clicking.  There was mild impingement symptoms.  Very large arms.  Could not tell the status of the biceps good grip and good release.  Seems to have good strength.  Negative Speed sign.  Positive empty can mild.  Right shoulder with full overhead motion  with some mild impingement. no localized areas of tenderness and no popping or clicking.  No grinding.  No AC joint tenderness.  No discomfort beneath the anterior lateral subacromial region.  Negative empty can  Specialty Comments:  No specialty comments available.  Imaging: No results found.   PMFS History: Patient Active Problem List   Diagnosis Date Noted   Pain in left shoulder 10/02/2019   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    Class: Chronic   Spinal stenosis of lumbar region 04/25/2019    Class: Chronic   Status post lumbar laminectomy 04/25/2019   Neck pain 11/12/2018   Chronic pain of right knee 09/13/2018   Bruising 09/13/2018   SOB (shortness of breath) 08/30/2018   Chronic pain of left knee 07/08/2018    Chronic hip pain 07/08/2018   Edema 07/08/2018   Left foot pain 04/08/2018   Olecranon bursitis of left elbow 04/05/2018   Leg swelling 12/05/2017   Need for shingles vaccine 12/05/2017   Primary osteoarthritis of both hands 11/23/2017   Rheumatoid factor positive 11/23/2017   Primary osteoarthritis of both knees 11/23/2017   Primary osteoarthritis of both feet 11/23/2017   DDD (degenerative disc disease), lumbar 11/23/2017   Vaccine counseling 08/13/2017   Polyarthralgia 08/13/2017   Joint stiffness 08/13/2017   Sensitive skin 01/23/2017   Need for influenza vaccination 01/23/2017   Proteinuria 01/23/2017   History of gastrointestinal stromal tumor (GIST) 04/20/2016   History of TIA (transient ischemic attack) 04/20/2016   Screening for breast cancer 04/20/2016   Estrogen deficiency 04/20/2016   Chronic maxillary sinusitis 04/20/2016   Impaired fasting blood sugar 04/20/2016   Constipation 04/20/2016   History of fall 04/20/2016   Chronic radicular lumbar pain 01/27/2016   Encounter for health maintenance examination in adult 03/22/2015   Paresthesia 03/22/2015   Cognitive decline 03/22/2015   Screening for cervical cancer 03/22/2015   Vitamin D deficiency 03/22/2015   History of uterine leiomyoma 03/22/2015   Gastroesophageal reflux disease without esophagitis 03/02/2014   Chronic nausea 03/02/2014   Chronic abdominal pain 03/02/2014   Rhinitis, allergic 03/02/2014   Essential hypertension 03/02/2014   Hyperlipidemia 03/02/2014   Obesity with serious comorbidity 01/16/2012   Past Medical History:  Diagnosis Date   Allergy    Anemia    iron therapy for years as of 10/12; normal Hgb 08/2013   Anxiety    Arthritis    Chest pain 04/05/2011   cardiac eval, normal treadmill stress test, Dr. Tollie Eth   Chronic back pain    Constipation    Farsightedness    w[facility] glasses, [facility]   Gastrointestinal stromal tumor  (GIST) ([place]) 06/2014   Dr. [name], [facility]   GERD (gastroesophageal reflux disease)    History of uterine fibroid    Hyperlipidemia    Hypertension    Paresthesia 09/2014   initially thought to be TIA, neurology consult in 12/2014 with other non TIA considerations.     Polyarthralgia    normal rheumatoid screen 01/2012   TIA (transient ischemic attack) 2015/16    Family History  Problem Relation Age of Onset   Hypertension Mother    Arthritis Mother    GER disease Mother    Glaucoma Mother    Breast cancer Mother    Stroke Father    Breast cancer Sister        breast cancer dx late 32s   Hypertension Sister    Colon cancer Brother 65  Diabetes Brother    Diabetes Maternal Aunt    Heart disease Maternal Aunt    Heart disease Maternal Grandmother    Heart disease Maternal Grandfather    Heart disease Maternal Uncle    Stroke Maternal Uncle    Leukemia Sister    Hypertension Sister    Healthy Son    Colon polyps Neg Hx    Esophageal cancer Neg Hx    Stomach cancer Neg Hx    Rectal cancer Neg Hx     Past Surgical History:  Procedure Laterality Date   COLONOSCOPY  01/2014   diverticulosis, othwerise normal - Dr. Owens Loffler   ESOPHAGOGASTRODUODENOSCOPY  2013   Dr. Benson Norway, gastritis   ESOPHAGOGASTRODUODENOSCOPY  [id number]   EUS N/A 04/16/2014   Procedure: UPPER ENDOSCOPIC ULTRASOUND (EUS) LINEAR;  Surgeon: Milus Banister, MD;  Location: [facility];  Service: Endoscopy;  Laterality: N/A;   gall stone surgery     KNEE ARTHROSCOPY Left    LAPAROSCOPIC GASTRIC RESECTION N/A 06/09/2014   Procedure: LAPAROSCOPIC GASTRIC MASS RESECTION;  Surgeon: [name], MD;  Location: [facility];  Service: General;  Laterality: N/A;   LIPOMA EXCISION     forehead   LUMBAR LAMINECTOMY N/A 04/25/2019   Procedure: LEFT L2-3 MICRODISCECTOMY, BILATERAL L5-S1 PARTIAL HEMILAMINECTOMY;  Surgeon: Jessy Oto, MD;  Location: [facility];   Service: Orthopedics;  Laterality: N/A;   UPPER GASTROINTESTINAL ENDOSCOPY     UTERINE FIBROID SURGERY     Social History   Occupational History   Occupation: IT sales professional: [employer]  Tobacco Use   Smoking status: Never Smoker   Smokeless tobacco: Never Used  Vaping Use   Vaping Use: Never used  Substance and Sexual Activity   Alcohol use: No    Alcohol/week: 0.0 standard drinks   Drug use: No   Sexual activity: Not on file

## 2019-11-19 ENCOUNTER — Other Ambulatory Visit: Payer: Self-pay

## 2019-11-19 ENCOUNTER — Encounter: Payer: Self-pay | Admitting: Medical

## 2019-11-19 ENCOUNTER — Ambulatory Visit (INDEPENDENT_AMBULATORY_CARE_PROVIDER_SITE_OTHER): Payer: PRIVATE HEALTH INSURANCE | Admitting: Medical

## 2019-11-19 VITALS — BP 108/70 | HR 90 | Ht 69.0 in | Wt 260.8 lb

## 2019-11-19 DIAGNOSIS — M5136 Other intervertebral disc degeneration, lumbar region: Secondary | ICD-10-CM

## 2019-11-19 DIAGNOSIS — R110 Nausea: Secondary | ICD-10-CM

## 2019-11-19 DIAGNOSIS — Z789 Other specified health status: Secondary | ICD-10-CM

## 2019-11-19 DIAGNOSIS — Z0000 Encounter for general adult medical examination without abnormal findings: Secondary | ICD-10-CM | POA: Diagnosis not present

## 2019-11-19 DIAGNOSIS — E7800 Pure hypercholesterolemia, unspecified: Secondary | ICD-10-CM

## 2019-11-19 DIAGNOSIS — Z23 Encounter for immunization: Secondary | ICD-10-CM

## 2019-11-19 DIAGNOSIS — I429 Cardiomyopathy, unspecified: Secondary | ICD-10-CM | POA: Diagnosis not present

## 2019-11-19 DIAGNOSIS — Z7189 Other specified counseling: Secondary | ICD-10-CM

## 2019-11-19 DIAGNOSIS — Z8509 Personal history of malignant neoplasm of other digestive organs: Secondary | ICD-10-CM

## 2019-11-19 DIAGNOSIS — G8929 Other chronic pain: Secondary | ICD-10-CM

## 2019-11-19 DIAGNOSIS — M0579 Rheumatoid arthritis with rheumatoid factor of multiple sites without organ or systems involvement: Secondary | ICD-10-CM

## 2019-11-19 DIAGNOSIS — Z6838 Body mass index (BMI) 38.0-38.9, adult: Secondary | ICD-10-CM | POA: Insufficient documentation

## 2019-11-19 DIAGNOSIS — E2839 Other primary ovarian failure: Secondary | ICD-10-CM

## 2019-11-19 DIAGNOSIS — I10 Essential (primary) hypertension: Secondary | ICD-10-CM | POA: Diagnosis not present

## 2019-11-19 DIAGNOSIS — R109 Unspecified abdominal pain: Secondary | ICD-10-CM

## 2019-11-19 DIAGNOSIS — M8949 Other hypertrophic osteoarthropathy, multiple sites: Secondary | ICD-10-CM

## 2019-11-19 DIAGNOSIS — Z8673 Personal history of transient ischemic attack (TIA), and cerebral infarction without residual deficits: Secondary | ICD-10-CM

## 2019-11-19 DIAGNOSIS — R269 Unspecified abnormalities of gait and mobility: Secondary | ICD-10-CM | POA: Insufficient documentation

## 2019-11-19 DIAGNOSIS — Z7185 Encounter for immunization safety counseling: Secondary | ICD-10-CM

## 2019-11-19 DIAGNOSIS — Z9989 Dependence on other enabling machines and devices: Secondary | ICD-10-CM

## 2019-11-19 DIAGNOSIS — K2950 Unspecified chronic gastritis without bleeding: Secondary | ICD-10-CM | POA: Insufficient documentation

## 2019-11-19 DIAGNOSIS — Z9889 Other specified postprocedural states: Secondary | ICD-10-CM

## 2019-11-19 DIAGNOSIS — M159 Polyosteoarthritis, unspecified: Secondary | ICD-10-CM | POA: Insufficient documentation

## 2019-11-19 DIAGNOSIS — E559 Vitamin D deficiency, unspecified: Secondary | ICD-10-CM

## 2019-11-19 DIAGNOSIS — M7060 Trochanteric bursitis, unspecified hip: Secondary | ICD-10-CM

## 2019-11-19 DIAGNOSIS — M7061 Trochanteric bursitis, right hip: Secondary | ICD-10-CM | POA: Insufficient documentation

## 2019-11-19 DIAGNOSIS — M5416 Radiculopathy, lumbar region: Secondary | ICD-10-CM

## 2019-11-19 DIAGNOSIS — J309 Allergic rhinitis, unspecified: Secondary | ICD-10-CM

## 2019-11-19 MED ORDER — DEXILANT 60 MG PO CPDR: 60.0000 mg | DELAYED_RELEASE_CAPSULE | Freq: Every day | ORAL | 3 refills | Status: DC

## 2019-11-19 MED ORDER — FLUTICASONE PROPIONATE 50 MCG/ACT NA SUSP: 1.0000 | Freq: Every day | NASAL | 11 refills | Status: DC

## 2019-11-19 MED ORDER — LOSARTAN POTASSIUM 50 MG PO TABS: 50.0000 mg | ORAL_TABLET | Freq: Every day | ORAL | 0 refills | Status: DC

## 2019-11-19 MED ORDER — CARVEDILOL 6.25 MG PO TABS: 6.2500 mg | ORAL_TABLET | Freq: Two times a day (BID) | ORAL | 0 refills | Status: DC

## 2019-11-19 MED ORDER — FEXOFENADINE HCL 180 MG PO TABS: 180.0000 mg | ORAL_TABLET | Freq: Every day | ORAL | 3 refills | Status: DC

## 2019-11-19 MED ORDER — SPIRONOLACTONE 25 MG PO TABS: 25.0000 mg | ORAL_TABLET | Freq: Every day | ORAL | 0 refills | Status: DC

## 2019-11-19 NOTE — Progress Notes (Signed)
Subjective: Chief Complaint  Patient presents with  . Annual Exam    with fasting labs    Medical care team includes:  Dr. Basil Dess, orthopedics  Dr. Rex Kras, cardiology  Dr. Bo Merino and Hazel Sams PA, rheumatology  Dr. [name], [facility]  Dr. Owens Loffler, GI  Yashica Sterbenz, Camelia Eng, PA-C here for primary care  Dr. Einar Gip, eye doctor  Sees dentist  Neurology, Dr. Narda Amber  Concerns: She is applying for disability.  Over the course of the last year or so she has had increasing difficulty with ambulation, activity. She has seen both rheumatology and orthopedics. She knows that she cannot lift over 10 pounds, she cannot walk but a very short distance without having to stop, she cannot stand very long, cannot stoop cannot climb. She has a history of carpal tunnel syndrome both hands, she has chronic back pain. All these make difficult for her to function at work at her job. She is using a cane and uses a seated walker.   She still goes to physical therapy regularly. She gets swelling in her knees.  Past Medical History:  Diagnosis Date  . Allergy   . Anemia    iron therapy for years as of 10/12; normal Hgb 08/2013  . Anxiety   . Arthritis   . Chest pain 04/05/2011   cardiac eval, normal treadmill stress test, Dr. Tollie Eth  . Chronic back pain   . Constipation   . Farsightedness    wears glasses, Eye care center  . 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 [name], [facility]  . GERD (gastroesophageal reflux disease)   . History of uterine fibroid   . Hyperlipidemia   . Hypertension   . Paresthesia 09/2014   initially thought to be TIA, neurology consult in 12/2014 with other non TIA considerations.    . Polyarthralgia    normal rheumatoid screen 01/2012  . TIA (transient ischemic attack) 2015/16    Past Surgical History:  Procedure Laterality Date  . COLONOSCOPY  01/2014    diverticulosis, othwerise normal - Dr. Owens Loffler  . ESOPHAGOGASTRODUODENOSCOPY  2013   Dr. Benson Norway, gastritis  . ESOPHAGOGASTRODUODENOSCOPY  [id number]  . EUS N/A 04/16/2014   Procedure: UPPER ENDOSCOPIC ULTRASOUND (EUS) LINEAR;  Surgeon: Milus Banister, MD;  Location: [facility];  Service: Endoscopy;  Laterality: N/A;  . gall stone surgery    . KNEE ARTHROSCOPY Left   . LAPAROSCOPIC GASTRIC RESECTION N/A 06/09/2014   Procedure: LAPAROSCOPIC GASTRIC MASS RESECTION;  Surgeon: [name], MD;  Location: [facility];  Service: General;  Laterality: N/A;  . LIPOMA EXCISION     forehead  . LUMBAR LAMINECTOMY N/A 04/25/2019   Procedure: LEFT L2-3 MICRODISCECTOMY, BILATERAL L5-S1 PARTIAL HEMILAMINECTOMY;  Surgeon: Jessy Oto, MD;  Location: [facility];  Service: Orthopedics;  Laterality: N/A;  . UPPER GASTROINTESTINAL ENDOSCOPY    . UTERINE FIBROID SURGERY      Family History  Problem Relation Age of Onset  . Hypertension Mother   . Arthritis Mother   . GER disease Mother   . Glaucoma Mother   . Breast cancer Mother   . Stroke Father   . Breast cancer Sister        breast cancer dx late 80s  . Hypertension Sister   . Colon cancer Brother 36  . Diabetes Brother   . Diabetes Maternal Aunt   . Heart disease Maternal Aunt   . Heart disease  Maternal Grandmother   . Heart disease Maternal Grandfather   . Heart disease Maternal Uncle   . Stroke Maternal Uncle   . Leukemia Sister   . Hypertension Sister   . Healthy Son   . Colon polyps Neg Hx   . Esophageal cancer Neg Hx   . Stomach cancer Neg Hx   . Rectal cancer Neg Hx      Current Outpatient Medications:  .  aspirin EC 81 MG tablet, Take 1 tablet (81 mg total) by mouth daily., Disp: 90 tablet, Rfl: 3 .  carvedilol (COREG) 6.25 MG tablet, Take 1 tablet (6.25 mg total) by mouth 2 (two) times daily. Hold if systolic blood pressure (top blood pressure number) less than 100 mmHg or heart rate less than 60 bpm (pulse)., Disp: 180 tablet,  Rfl: 0 .  cholecalciferol (VITAMIN D3) 25 MCG (1000 UNIT) tablet, Take 1 tablet (1,000 Units total) by mouth daily., Disp: 90 tablet, Rfl: 3 .  dexlansoprazole (DEXILANT) 60 MG capsule, Take 1 capsule (60 mg total) by mouth daily., Disp: 90 capsule, Rfl: 3 .  etodolac (LODINE) 400 MG tablet, Take 1 tablet (400 mg total) by mouth 2 (two) times daily after a meal., Disp: 60 tablet, Rfl: 3 .  famotidine (PEPCID) 20 MG tablet, TAKE 1 TABLET DAILY AT BEDTIME, Disp: 90 tablet, Rfl: 3 .  fexofenadine (ALLEGRA ALLERGY) 180 MG tablet, Take 1 tablet (180 mg total) by mouth daily., Disp: 90 tablet, Rfl: 3 .  fluticasone (FLONASE) 50 MCG/ACT nasal spray, Place 1 spray into both nostrils daily., Disp: 16 g, Rfl: 11 .  gabapentin (NEURONTIN) 300 MG capsule, Take 1 capsule (300 mg total) by mouth 3 (three) times daily., Disp: 270 capsule, Rfl: 3 .  hydroxychloroquine (PLAQUENIL) 200 MG tablet, TAKE 1 TABLET BY MOUTH TWICE DAILY FOR RHEUMATOID ARTHRITIS, Disp: 60 tablet, Rfl: 0 .  linaclotide (LINZESS) 145 MCG CAPS capsule, Take 1 capsule (145 mcg total) by mouth daily before breakfast., Disp: 90 capsule, Rfl: 1 .  losartan (COZAAR) 50 MG tablet, Take 1 tablet (50 mg total) by mouth daily., Disp: 90 tablet, Rfl: 0 .  methocarbamol (ROBAXIN) 500 MG tablet, Take 1 tablet (500 mg total) by mouth every 6 (six) hours as needed for muscle spasms., Disp: 50 tablet, Rfl: 0 .  Misc. Devices (ROLLATOR ULTRA-LIGHT) MISC, Use as directed., Disp: 1 each, Rfl: 0 .  ondansetron (ZOFRAN) 4 MG tablet, Take 1 tablet (4 mg total) by mouth every 8 (eight) hours as needed for nausea or vomiting. TAKE ONE TABLET BY MOUTH EVERY 8 HOURS AS NEEDED FOR  NAUSEA  OR  VOMITING, Disp: 90 tablet, Rfl: 0 .  potassium chloride (KLOR-CON) 10 MEQ tablet, Take 1 tablet (10 mEq total) by mouth 2 (two) times daily., Disp: 180 tablet, Rfl: 3 .  simvastatin (ZOCOR) 20 MG tablet, Take 1 tablet (20 mg total) by mouth daily., Disp: 90 tablet, Rfl: 3 .   venlafaxine XR (EFFEXOR XR) 37.5 MG 24 hr capsule, Take 1 capsule (37.5 mg total) by mouth daily with breakfast., Disp: 90 capsule, Rfl: 0 .  spironolactone (ALDACTONE) 25 MG tablet, Take 1 tablet (25 mg total) by mouth daily., Disp: 90 tablet, Rfl: 0  Allergies  Allergen Reactions  . Kiwi Extract Hives and Itching  . Mucinex Dm [Dm-Guaifenesin Er] Nausea Only    Messes up her stomach. Pt report on 01/21/15  . Peach [Prunus Persica] Itching    Peach peeling makes pt itch  Reviewed their medical, surgical, family, social, medication, and allergy history and updated chart as appropriate.   Review of Systems Constitutional: -fever, -chills, -sweats, -unexpected weight change, -decreased appetite, -fatigue Allergy: -sneezing, -itching, -congestion Dermatology: -changing moles, --rash, -lumps ENT: -runny nose, -ear pain, -sore throat, -hoarseness, -sinus pain, -teeth pain, - ringing in ears, -hearing loss, -nosebleeds Cardiology: -chest pain, -palpitations, -swelling, -difficulty breathing when lying flat, -waking up short of breath Respiratory: -cough, -shortness of breath, -difficulty breathing with exercise or exertion, -wheezing, -coughing up blood Gastroenterology: -abdominal pain, -nausea, -vomiting, -diarrhea, -constipation, -blood in stool, -changes in bowel movement, -difficulty swallowing or eating Hematology: -bleeding, -bruising  Musculoskeletal: -joint aches, -muscle aches, -joint swelling, -back pain, -neck pain, -cramping, -changes in gait Ophthalmology: denies vision changes, eye redness, itching, discharge Urology: -burning with urination, -difficulty urinating, -blood in urine, -urinary frequency, -urgency, -incontinence Neurology: -headache, -weakness, -tingling, -numbness, -memory loss, -falls, -dizziness Psychology: -depressed mood, -agitation, -sleep problems Breast/gyn: -breast tendnerss, -discharge, -lumps, -vaginal discharge,- irregular periods, -heavy  periods     Objective:  BP 108/70   Pulse 90   Ht 5\' 9"  (1.753 m)   Wt 260 lb 12.8 oz (118.3 kg)   LMP 02/23/2015 (LMP Unknown)   SpO2 90%   BMI 38.51 kg/m   General appearance: alert, no distress, WD/WN, African American female, seated with cane Skin: Unremarkable HEENT: normocephalic, conjunctiva/corneas normal, sclerae anicteric, PERRLA, EOMi, nares patent, no discharge or erythema, pharynx normal Oral cavity: MMM, tongue normal, teeth normal Neck: supple, no lymphadenopathy, no thyromegaly, no masses, normal ROM, no bruits Chest: non tender, normal shape and expansion Heart: RRR, normal S1, S2, no murmurs Lungs: CTA bilaterally, no wheezes, rhonchi, or rales Abdomen: +bs, soft, non tender, non distended, no masses, no hepatomegaly, no splenomegaly, no bruits Back: She has some generalized tenderness with palpation of upper and mid back paraspinal, otherwise non tender, normal ROM, no scoliosis Musculoskeletal: Tender over knees in general, otherwise upper extremities non tender, no obvious deformity, normal ROM throughout, lower extremities non tender, no obvious deformity, normal ROM throughout Extremities: no edema, no cyanosis, no clubbing Pulses: 2+ symmetric, upper and lower extremities, normal cap refill Neurological: alert, oriented x 3, CN2-12 intact, strength normal upper extremities and lower extremities, sensation normal throughout, DTRs 2+ throughout, no cerebellar signs, gait normal Psychiatric: normal affect, behavior normal, pleasant  Breast/gyn/rectal - deferred     Assessment and Plan :   Encounter Diagnoses  Name Primary?  . Encounter for health maintenance examination in adult Yes  . Cardiomyopathy, unspecified type ([place])   . Essential hypertension   . History of TIA (transient ischemic attack)   . Pure hypercholesterolemia   . BMI 38.0-38.9,adult   . Rheumatoid arthritis involving multiple sites with positive rheumatoid factor ([place])   . Primary  osteoarthritis involving multiple joints   . DDD (degenerative disc disease), lumbar   . Status post lumbar laminectomy   . Trochanteric bursitis, unspecified laterality   . Chronic radicular lumbar pain   . Need for influenza vaccination   . Vaccine counseling   . Estrogen deficiency   . Chronic nausea   . Chronic abdominal pain   . Vitamin D deficiency   . History of gastrointestinal stromal tumor (GIST)   . Allergic rhinitis, unspecified seasonality, unspecified trigger   . Decreased activities of daily living (ADL)   . Gait disturbance   . Chronic gastritis without bleeding, unspecified gastritis type   . Use of cane as ambulatory aid     Physical  exam - discussed and counseled on healthy lifestyle, diet, exercise, preventative care, vaccinations, sick and well care, proper use of emergency dept and after hours care, and addressed their concerns.    Health screening: Advised they see their eye doctor yearly for routine vision care. Advised they see their dentist yearly for routine dental care including hygiene visits twice yearly.  Cancer screening Counseled on self breast exams, mammograms, cervical cancer screening  Colonoscopy:  Reviewed colonoscopy on file that is up to date  Cervical cancer screening-reviewed last several Paps. Plan to do next Pap with HPV. She can come back for this soon at her convenience when she has more time, or we will repeat in a year.  Continue routine mammogram   Vaccinations: Advised yearly influenza vaccine Counseled on the influenza virus vaccine.  Vaccine information sheet given.  Influenza vaccine given after consent obtained. She is up-to-date on tetanus booster, Shingrix, Covid vaccine   Separate significant issues discussed:  Cardiomyopathy, hyperlipidemia, hypertension-I reviewed her May 2021 cardiology notes. She is about advised to lose weight and continue risk factor reduction. She was continued on losartan, spironolactone was  started, she was continued on carvedilol. Hydrochlorothiazide was discontinued. Advised to continue aspirin and statin.  I reviewed her April 2021 rheumatology notes.  On chronic therapy, sees specialist regularly, relies on walker or cane, limited in activity.  Medications managed by rheumatology and ortho  We discussed that her rheumatology and pain medicines are filled by those specialist and not me. We had been getting refill request after refill request for an NSAID. I advised that she make sure she gets this to her rheumatologist or orthopedist.  Vitamin D deficiency-continue supplement  Chronic nausea, chronic abdominal pain-lately not a problem, stable  History of stroke-continue secondary prevention  History of gist tumor-stable   Hetty was seen today for annual exam.  Diagnoses and all orders for this visit:  Encounter for health maintenance examination in adult -     CBC with Differential/Platelet -     Comprehensive metabolic panel -     Hemoglobin A1c -     TSH  Cardiomyopathy, unspecified type ([place]) -     carvedilol (COREG) 6.25 MG tablet; Take 1 tablet (6.25 mg total) by mouth 2 (two) times daily. Hold if systolic blood pressure (top blood pressure number) less than 100 mmHg or heart rate less than 60 bpm (pulse). -     spironolactone (ALDACTONE) 25 MG tablet; Take 1 tablet (25 mg total) by mouth daily.  Essential hypertension  History of TIA (transient ischemic attack)  Pure hypercholesterolemia  BMI 38.0-38.9,adult  Rheumatoid arthritis involving multiple sites with positive rheumatoid factor ([facility])  Primary osteoarthritis involving multiple joints  DDD (degenerative disc disease), lumbar  Status post lumbar laminectomy  Trochanteric bursitis, unspecified laterality  Chronic radicular lumbar pain  Need for influenza vaccination  Vaccine counseling  Estrogen deficiency  Chronic nausea  Chronic abdominal pain  Vitamin D deficiency  History  of gastrointestinal stromal tumor (GIST)  Allergic rhinitis, unspecified seasonality, unspecified trigger  Decreased activities of daily living (ADL)  Gait disturbance  Chronic gastritis without bleeding, unspecified gastritis type -     dexlansoprazole (DEXILANT) 60 MG capsule; Take 1 capsule (60 mg total) by mouth daily.  Use of cane as ambulatory aid  Other orders -     Flu Vaccine QUAD 6+ mos PF IM (Fluarix Quad PF) -     fexofenadine (ALLEGRA ALLERGY) 180 MG tablet; Take 1 tablet (180 mg total)  by mouth daily. -     fluticasone (FLONASE) 50 MCG/ACT nasal spray; Place 1 spray into both nostrils daily. -     losartan (COZAAR) 50 MG tablet; Take 1 tablet (50 mg total) by mouth daily.    Follow-up pending labs, yearly for physical

## 2019-11-20 ENCOUNTER — Other Ambulatory Visit: Payer: Self-pay | Admitting: Medical

## 2019-11-20 LAB — COMPREHENSIVE METABOLIC PANEL
ALT: 13 IU/L (ref 0–32)
AST: 15 IU/L (ref 0–40)
Albumin/Globulin Ratio: 1.6 (ref 1.2–2.2)
Albumin: 4.3 g/dL (ref 3.8–4.9)
Alkaline Phosphatase: 134 IU/L — ABNORMAL HIGH (ref 48–121)
BUN/Creatinine Ratio: 18 (ref 9–23)
BUN: 12 mg/dL (ref 6–24)
Bilirubin Total: 0.3 mg/dL (ref 0.0–1.2)
CO2: 26 mmol/L (ref 20–29)
Calcium: 9.5 mg/dL (ref 8.7–10.2)
Chloride: 103 mmol/L (ref 96–106)
Creatinine, Ser: 0.66 mg/dL (ref 0.57–1.00)
GFR calc Af Amer: 113 mL/min/{1.73_m2} (ref >59)
GFR calc non Af Amer: 98 mL/min/{1.73_m2} (ref >59)
Globulin, Total: 2.7 g/dL (ref 1.5–4.5)
Glucose: 90 mg/dL (ref 65–99)
Potassium: 4.5 mmol/L (ref 3.5–5.2)
Sodium: 141 mmol/L (ref 134–144)
Total Protein: 7 g/dL (ref 6.0–8.5)

## 2019-11-20 LAB — CBC WITH DIFFERENTIAL/PLATELET
Basophils Absolute: 0 10*3/uL (ref 0.0–0.2)
Basos: 0 %
EOS (ABSOLUTE): 0.1 10*3/uL (ref 0.0–0.4)
Eos: 2 %
Hematocrit: 40.1 % (ref 34.0–46.6)
Hemoglobin: 12.6 g/dL (ref 11.1–15.9)
Immature Grans (Abs): 0 10*3/uL (ref 0.0–0.1)
Immature Granulocytes: 0 %
Lymphocytes Absolute: 2.3 10*3/uL (ref 0.7–3.1)
Lymphs: 39 %
MCH: 26.8 pg (ref 26.6–33.0)
MCHC: 31.4 g/dL — ABNORMAL LOW (ref 31.5–35.7)
MCV: 85 fL (ref 79–97)
Monocytes Absolute: 0.5 10*3/uL (ref 0.1–0.9)
Monocytes: 8 %
Neutrophils Absolute: 3.1 10*3/uL (ref 1.4–7.0)
Neutrophils: 51 %
Platelets: 358 10*3/uL (ref 150–450)
RBC: 4.71 x10E6/uL (ref 3.77–5.28)
RDW: 12.9 % (ref 11.7–15.4)
WBC: 6 10*3/uL (ref 3.4–10.8)

## 2019-11-20 LAB — TSH: TSH: 1.7 u[IU]/mL (ref 0.450–4.500)

## 2019-11-20 LAB — HEMOGLOBIN A1C
Est. average glucose Bld gHb Est-mCnc: 126 mg/dL
Hgb A1c MFr Bld: 6 % — ABNORMAL HIGH (ref 4.8–5.6)

## 2019-11-21 ENCOUNTER — Ambulatory Visit: Payer: PRIVATE HEALTH INSURANCE | Admitting: Specialist

## 2019-11-21 ENCOUNTER — Other Ambulatory Visit: Payer: Self-pay

## 2019-11-21 ENCOUNTER — Telehealth: Payer: Self-pay

## 2019-11-21 DIAGNOSIS — I429 Cardiomyopathy, unspecified: Secondary | ICD-10-CM

## 2019-11-21 NOTE — Telephone Encounter (Signed)
Is there a way to order her medications as a pill pack?

## 2019-11-21 NOTE — Telephone Encounter (Signed)
Pt. Called stating that she did want her medicine changed to the pill packs as discussed with Audelia Acton at her last apt. If you could send that in to the [facility] on [address]. A 90 day supply with pill packs for her medication that were sent in on 11/19/19.

## 2019-11-21 NOTE — Telephone Encounter (Signed)
Before I send ALL of those meds, please call pharamciest, make sure that her pharmacy does month supply bubble pack for routine meds.   If they do, go over list and see if any are not able to be put in the month pack.  If her pharmacy doesn't do this, we will need to use [facility] or other [facility]

## 2019-11-25 ENCOUNTER — Encounter: Payer: Self-pay | Admitting: Medical

## 2019-11-25 ENCOUNTER — Other Ambulatory Visit (HOSPITAL_COMMUNITY)
Admission: RE | Admit: 2019-11-25 | Discharge: 2019-11-25 | Disposition: A | Discharge status: Home / Self Care | Payer: PRIVATE HEALTH INSURANCE | Source: Ambulatory Visit | Attending: Medical | Admitting: Medical

## 2019-11-25 ENCOUNTER — Other Ambulatory Visit: Payer: Self-pay | Admitting: Medical

## 2019-11-25 ENCOUNTER — Other Ambulatory Visit: Payer: Self-pay

## 2019-11-25 ENCOUNTER — Ambulatory Visit (INDEPENDENT_AMBULATORY_CARE_PROVIDER_SITE_OTHER): Payer: PRIVATE HEALTH INSURANCE | Admitting: Medical

## 2019-11-25 VITALS — BP 130/80 | HR 78 | Ht 69.0 in | Wt 265.0 lb

## 2019-11-25 DIAGNOSIS — Z780 Asymptomatic menopausal state: Secondary | ICD-10-CM | POA: Diagnosis not present

## 2019-11-25 DIAGNOSIS — Z124 Encounter for screening for malignant neoplasm of cervix: Secondary | ICD-10-CM

## 2019-11-25 DIAGNOSIS — K2950 Unspecified chronic gastritis without bleeding: Secondary | ICD-10-CM

## 2019-11-25 NOTE — Telephone Encounter (Signed)
See other message bubble pack

## 2019-11-25 NOTE — Progress Notes (Signed)
Subjective:  Lisa Briggs is a 59 y.o. female who presents for Chief Complaint  Patient presents with  . Gynecologic Exam     Here for Pap only.  She came in recently for a physical.  She wanted to defer that day.  She notes that she still has her reproductive organs.  She has a history of 1 pregnancy and 1 live birth.  No current bleeding.  Last menstrual period greater than 1 year ago.  No other concerns.  No vaginal dryness, no vaginal discharge no pelvic pain.  No other aggravating or relieving factors.    No other c/o.  The following portions of the patient's history were reviewed and updated as appropriate: allergies, current medications, past family history, past medical history, past social history, past surgical history and problem list.  ROS Otherwise as in subjective above  Objective: BP 130/80   Pulse 78   Ht 5\' 9"  (1.753 m)   Wt 265 lb (120.2 kg)   LMP 02/23/2015 (LMP Unknown)   SpO2 93%   BMI 39.13 kg/m   General appearance: alert, no distress, well developed, well nourished GYN: Normal external genitalia, no cervical lesions, normal pink mucosa, no adnexal mass, uterus questionable enlarged but she does have a history of fibroids.  Nontender on exam.  No other abnormality.  Pap taken.  Exam chaperoned by nurse    Assessment: Encounter Diagnoses  Name Primary?  . Menopause Yes  . Screening for cervical cancer      Plan: Routine Pap screening today with HPV DNA  Lisa Briggs was seen today for gynecologic exam.  Diagnoses and all orders for this visit:  Menopause  Screening for cervical cancer -     Cytology - PAP([place])    Follow up: Pending Pap

## 2019-11-26 NOTE — Telephone Encounter (Signed)
Tried calling patient, no answer. [facility] is where patient suggest her medications go to, does not do pill pack.

## 2019-11-26 NOTE — Telephone Encounter (Signed)
Please see me as I am confused.  There have been several back-and-forth messages recently about refills.  The most recent messages seem to that she did want bubble pack..  I recently refilled several medicines to [facility], then to [facility] as requested.  At this point if we are not careful she is going to get duplicates of medicines refilled creating a confusing mess  So before I do any refills lets verify exactly where she wants to get her medications from and let us stick with that pharmacy.  If she picked up some of her recent refills for 90-day supply then there is no reason to refill them anywhere else just yet until they are up for refill  So verify to we in fact need to refill anything now or not?

## 2019-11-26 NOTE — Telephone Encounter (Signed)
This needs to go to [facility]. Apparently they do pill pack.

## 2019-11-26 NOTE — Telephone Encounter (Signed)
Patient would like her meds to be sent to [facility].

## 2019-11-27 ENCOUNTER — Other Ambulatory Visit: Payer: Self-pay | Admitting: Medical

## 2019-11-27 DIAGNOSIS — I429 Cardiomyopathy, unspecified: Secondary | ICD-10-CM

## 2019-11-27 DIAGNOSIS — K2950 Unspecified chronic gastritis without bleeding: Secondary | ICD-10-CM

## 2019-11-27 LAB — CYTOLOGY - PAP
Adequacy: ABSENT
Comment: NEGATIVE
Diagnosis: NEGATIVE
High risk HPV: NEGATIVE

## 2019-11-27 MED ORDER — LINACLOTIDE 145 MCG PO CAPS: 145.0000 ug | ORAL_CAPSULE | Freq: Every day | ORAL | 1 refills | Status: DC

## 2019-11-27 MED ORDER — VITAMIN D 25 MCG (1000 UNIT) PO TABS: 1000.0000 [IU] | ORAL_TABLET | Freq: Every day | ORAL | 3 refills | Status: DC

## 2019-11-27 MED ORDER — SPIRONOLACTONE 25 MG PO TABS: 25.0000 mg | ORAL_TABLET | Freq: Every day | ORAL | 0 refills | Status: DC

## 2019-11-27 MED ORDER — FAMOTIDINE 20 MG PO TABS: ORAL_TABLET | ORAL | 3 refills | Status: DC

## 2019-11-27 MED ORDER — POTASSIUM CHLORIDE CRYS ER 10 MEQ PO TBCR: 10.0000 meq | EXTENDED_RELEASE_TABLET | Freq: Two times a day (BID) | ORAL | 3 refills | Status: DC

## 2019-11-27 MED ORDER — GABAPENTIN 300 MG PO CAPS: 300.0000 mg | ORAL_CAPSULE | Freq: Three times a day (TID) | ORAL | 3 refills | Status: DC

## 2019-11-27 MED ORDER — VENLAFAXINE HCL ER 37.5 MG PO CP24: 37.5000 mg | ORAL_CAPSULE | Freq: Every day | ORAL | 0 refills | Status: DC

## 2019-11-27 MED ORDER — DEXILANT 60 MG PO CPDR: 60.0000 mg | DELAYED_RELEASE_CAPSULE | Freq: Every day | ORAL | 3 refills | Status: DC

## 2019-11-27 MED ORDER — LOSARTAN POTASSIUM 50 MG PO TABS: 50.0000 mg | ORAL_TABLET | Freq: Every day | ORAL | 0 refills | Status: DC

## 2019-11-27 MED ORDER — ASPIRIN EC 81 MG PO TBEC: 81.0000 mg | DELAYED_RELEASE_TABLET | Freq: Every day | ORAL | 3 refills | Status: DC

## 2019-11-27 MED ORDER — SIMVASTATIN 20 MG PO TABS: 20.0000 mg | ORAL_TABLET | Freq: Every day | ORAL | 3 refills | Status: DC

## 2019-11-27 MED ORDER — FEXOFENADINE HCL 180 MG PO TABS: 180.0000 mg | ORAL_TABLET | Freq: Every day | ORAL | 3 refills | Status: DC

## 2019-11-27 MED ORDER — CARVEDILOL 6.25 MG PO TABS: 6.2500 mg | ORAL_TABLET | Freq: Two times a day (BID) | ORAL | 0 refills | Status: DC

## 2019-11-27 NOTE — Telephone Encounter (Signed)
Call [facility] and make sure they got all the prescriptions were sent bubble packed 30-day.  I am trying to get all of her medicines in a way easier for her  to use in a pill pack.  The only medicines that are oral and not in the bubble of 30-day pack is the following:  lodine, plaquenil, robaxin, zofran  Zofran and Robaxin are as needed, the Plaquenil and Lodine are prescribed by other providers in other offices  If anything is too expensive or going to be a problem let us know  Let patient know about this change and she needs to call [facility] or other pharmacies to make sure other prescriptions are not being refilled automatically

## 2019-11-27 NOTE — Progress Notes (Signed)
Office Visit Note  [name]             Date of Birth: 04-06-1960           MRN: [id number]             PCP: Carlena Hurl, PA-C Referring: Carlena Hurl, PA-C Visit Date: 12/09/2019 Occupation: @GUAROCC @  Subjective:  Medication monitoring   History of Present Illness: Lisa Briggs is a 59 y.o. female with history of seropositive rheumatoid arthritis and osteoarthritis.  She is taking plaquenil 200 mg 1 tablet by mouth twice daily.  She denies any recent rheumatoid arthritis flares.  She states she has intermittent pain in both hands and both knee joints.  She continues to have chronic lower back pain. She has intermittent symptoms of left sided radiculopathy despite having surgery.  She has an upcoming appointment with Dr. Louanne Skye in 2 days.  She denies any recurrence of olecranon bursitis.   She has received both covid-19 vaccinations and is planning on receiving the 3rd dose.  She has not had any recent infections.    Activities of Daily Living:  Patient reports joint stiffness all day  Patient Reports nocturnal pain.  Difficulty dressing/grooming: Denies Difficulty climbing stairs: Reports Difficulty getting out of chair: Denies Difficulty using hands for taps, buttons, cutlery, and/or writing: Reports  Review of Systems  Constitutional: Positive for fatigue.  HENT: Positive for mouth dryness. Negative for mouth sores and nose dryness.   Eyes: Positive for dryness. Negative for pain and visual disturbance.  Respiratory: Negative for cough, hemoptysis, shortness of breath and difficulty breathing.   Cardiovascular: Negative for chest pain, palpitations, hypertension and swelling in legs/feet.  Gastrointestinal: Negative for blood in stool, constipation and diarrhea.  Endocrine: Negative for increased urination.  Genitourinary: Negative for painful urination.  Musculoskeletal: Positive for arthralgias, joint pain, joint swelling and morning stiffness.  Negative for myalgias, muscle weakness, muscle tenderness and myalgias.  Skin: Negative for color change, pallor, rash, hair loss, nodules/bumps, skin tightness, ulcers and sensitivity to sunlight.  Allergic/Immunologic: Negative for susceptible to infections.  Neurological: Negative for dizziness, numbness, headaches and weakness.  Hematological: Negative for swollen glands.  Psychiatric/Behavioral: Negative for depressed mood and sleep disturbance. The patient is not nervous/anxious.     PMFS History:  Patient Active Problem List   Diagnosis Date Noted  . Menopause 11/25/2019  . Cardiomyopathy ([place]) 11/19/2019  . BMI 38.0-38.9,adult 11/19/2019  . Rheumatoid arthritis involving multiple sites with positive rheumatoid factor ([place]) 11/19/2019  . Primary osteoarthritis involving multiple joints 11/19/2019  . Trochanteric bursitis 11/19/2019  . Decreased activities of daily living (ADL) 11/19/2019  . Gait disturbance 11/19/2019  . Chronic gastritis without bleeding 11/19/2019  . Use of cane as ambulatory aid 11/19/2019  . Pain in left shoulder 10/02/2019  . 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 Spinal stenosis of lumbar region 04/25/2019    Class: Chronic  . Status post lumbar laminectomy 04/25/2019  . Neck pain 11/12/2018  . Chronic pain of right knee 09/13/2018  . Bruising 09/13/2018  . SOB (shortness of breath) 08/30/2018  . Chronic pain of left knee 07/08/2018  . Chronic hip pain 07/08/2018  . Edema 07/08/2018  . Left foot pain 04/08/2018  . Olecranon bursitis of left elbow 04/05/2018  . Leg swelling 12/05/2017  . Need for shingles vaccine 12/05/2017  . Primary osteoarthritis of both hands 11/23/2017  . Rheumatoid factor positive 11/23/2017  . Primary osteoarthritis  of both knees 11/23/2017  . Primary osteoarthritis of both feet 11/23/2017  . DDD (degenerative disc disease), lumbar 11/23/2017  . Vaccine counseling 08/13/2017  .  Polyarthralgia 08/13/2017  . Joint stiffness 08/13/2017  . Sensitive skin 01/23/2017  . Need for influenza vaccination 01/23/2017  . Proteinuria 01/23/2017  . History of gastrointestinal stromal tumor (GIST) 04/20/2016  . History of TIA (transient ischemic attack) 04/20/2016  . Screening for breast cancer 04/20/2016  . Estrogen deficiency 04/20/2016  . Chronic maxillary sinusitis 04/20/2016  . Impaired fasting blood sugar 04/20/2016  . Constipation 04/20/2016  . History of fall 04/20/2016  . Chronic radicular lumbar pain 01/27/2016  . Encounter for health maintenance examination in adult 03/22/2015  . Paresthesia 03/22/2015  . Cognitive decline 03/22/2015  . Screening for cervical cancer 03/22/2015  . Vitamin D deficiency 03/22/2015  . History of uterine leiomyoma 03/22/2015  . Gastroesophageal reflux disease without esophagitis 03/02/2014  . Chronic nausea 03/02/2014  . Chronic abdominal pain 03/02/2014  . Rhinitis, allergic 03/02/2014  . Essential hypertension 03/02/2014  . Hyperlipidemia 03/02/2014  . Obesity with serious comorbidity 01/16/2012    Past Medical History:  Diagnosis Date  . Allergy   . Anemia    iron therapy for years as of 10/12; normal Hgb 08/2013  . Anxiety   . Arthritis   . Chest pain 04/05/2011   cardiac eval, normal treadmill stress test, Dr. Tollie Eth  . Chronic back pain   . Constipation   . Farsightedness    wears glasses, Eye care center  . 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 [name], [name]  . GERD (gastroesophageal reflux disease)   . History of uterine fibroid   . Hyperlipidemia   . Hypertension   . Paresthesia 09/2014   initially thought to be TIA, neurology consult in 12/2014 with other non TIA considerations.    . Polyarthralgia    normal rheumatoid screen 01/2012  . TIA (transient ischemic attack) 2015/16    Family History  Problem Relation Age of Onset  . Hypertension Mother   .  Arthritis Mother   . GER disease Mother   . Glaucoma Mother   . Breast cancer Mother   . Stroke Father   . Breast cancer Sister        breast cancer dx late 12s  . Hypertension Sister   . Colon cancer Brother 41  . Diabetes Brother   . Diabetes Maternal Aunt   . Heart disease Maternal Aunt   . Heart disease Maternal Grandmother   . Heart disease Maternal Grandfather   . Heart disease Maternal Uncle   . Stroke Maternal Uncle   . Leukemia Sister   . Hypertension Sister   . Healthy Son   . Colon polyps Neg Hx   . Esophageal cancer Neg Hx   . Stomach cancer Neg Hx   . Rectal cancer Neg Hx    Past Surgical History:  Procedure Laterality Date  . COLONOSCOPY  01/2014   diverticulosis, othwerise normal - Dr. Owens Loffler  . ESOPHAGOGASTRODUODENOSCOPY  2013   Dr. Benson Norway, gastritis  . ESOPHAGOGASTRODUODENOSCOPY  [id number]  . EUS N/A 04/16/2014   Procedure: UPPER ENDOSCOPIC ULTRASOUND (EUS) LINEAR;  Surgeon: Milus Banister, MD;  Location: [facility];  Service: Endoscopy;  Laterality: N/A;  . gall stone surgery    . KNEE ARTHROSCOPY Left   . LAPAROSCOPIC GASTRIC RESECTION N/A 06/09/2014   Procedure: LAPAROSCOPIC GASTRIC MASS RESECTION;  Surgeon: [name], MD;  Location: [facility];  Service: General;  Laterality: N/A;  . LIPOMA EXCISION     forehead  . LUMBAR LAMINECTOMY N/A 04/25/2019   Procedure: LEFT L2-3 MICRODISCECTOMY, BILATERAL L5-S1 PARTIAL HEMILAMINECTOMY;  Surgeon: Jessy Oto, MD;  Location: [facility];  Service: Orthopedics;  Laterality: N/A;  . UPPER GASTROINTESTINAL ENDOSCOPY    . UTERINE FIBROID SURGERY     Social History   Social History Narrative   Married, has 1 son in [place] and some grandchildren.  Glass blower/designer.  Active on job.  Does stretching and exercises daily as per physical therapy.  Works 12 hours daily.     Immunization History  Administered Date(s) Administered  . Influenza,inj,Quad PF,6+ Mos 11/24/2013, 01/18/2015, 01/23/2017, 12/05/2017,  12/17/2018, 11/19/2019  . PFIZER SARS-COV-2 Vaccination 06/16/2019, 07/08/2019  . Pneumococcal Polysaccharide-23 05/21/2015  . Tdap 01/17/2011  . Zoster Recombinat (Shingrix) 10/05/2017, 12/07/2017     Objective: Vital Signs: BP (!) 87/61 (BP Location: Right Arm, Patient Position: Sitting, Cuff Size: Large)   Pulse 74   Resp 17   Ht 5\' 9"  (1.753 m)   Wt 257 lb (116.6 kg)   LMP 02/23/2015 (LMP Unknown)   BMI 37.95 kg/m    Physical Exam Vitals and nursing note reviewed.  Constitutional:      Appearance: She is well-developed.  HENT:     Head: Normocephalic and atraumatic.  Eyes:     Conjunctiva/sclera: Conjunctivae normal.  Pulmonary:     Effort: Pulmonary effort is normal.  Abdominal:     Palpations: Abdomen is soft.  Musculoskeletal:     Cervical back: Normal range of motion.  Skin:    General: Skin is warm and dry.     Capillary Refill: Capillary refill takes less than 2 seconds.  Neurological:     Mental Status: She is alert and oriented to person, place, and time.  Psychiatric:        Behavior: Behavior normal.      Musculoskeletal Exam: Generalized hyperalgesia on exam.  C-spine has good ROM with some discomfort.  Postural thoracic kyphosis.  Painful ROM of lumbar spine.  Shoulder joints, elbow joints, wrist joints, MCPS, PIPs, and DIPs good ROM with no synovitis.  Complete fist formation bilaterally.  PIP and DIP thickening consistent with osteoarthritis of both hands.  Hip joints difficult to assess in seated position.  Knee joints have good ROM with no warmth or effusion.  Ankle joints good ROM with no tenderness or inflammation.  No tenderness of MTP joints.   CDAI Exam: CDAI Score: 0.6  Patient Global: 3 mm; Provider Global: 3 mm Swollen: 0 ; Tender: 0  Joint Exam 12/09/2019   No joint exam has been documented for this visit   There is currently no information documented on the homunculus. Go to the Rheumatology activity and complete the homunculus joint  exam.  Investigation: No additional findings.  Imaging: MR Lumbar Spine W Wo Contrast  Result Date: 11/28/2019 CLINICAL DATA:  Low back pain. Recent left L2-3 micro discectomy. Persistent back pain. EXAM: MRI LUMBAR SPINE WITHOUT AND WITH CONTRAST TECHNIQUE: Multiplanar and multiecho pulse sequences of the lumbar spine were obtained without and with intravenous contrast. CONTRAST:  69mL MULTIHANCE GADOBENATE DIMEGLUMINE 529 MG/ML IV SOLN COMPARISON:  Radiography 04/25/2019.  MRI 12/22/2018. FINDINGS: Segmentation: 5 lumbar type vertebral bodies as numbered previously. Alignment:  Mild curvature convex to the right with the apex at L3. Vertebrae: Discogenic endplate edema and enhancement at L2-3, often seen following recent discectomy. Conus medullaris and cauda  equina: Conus extends to the L1 level. Conus and cauda equina appear normal. Paraspinal and other soft tissues: Typical edema and enhancement along the surgical approach at L2-3. No evidence of pseudomeningocele or soft tissue abscess. Disc levels: Chronic non-compressive disc bulges at T11-12, T12-L1 and L1-2. At L2-3, there is been previous left-sided laminotomy and discectomy. There is a recurrent left posterolateral disc herniation with stenosis of the left lateral recess that could cause left-sided neural compression. L3-4: Endplate osteophytes and bulging of the disc. Facet and ligamentous hypertrophy. Mild stenosis of the right lateral recess and intervertebral foramen on the right but without definite neural compression. No change. L4-5: Chronic disc degeneration with loss of disc height. Canal and foramina widely patent. Probable facet fusion. L5-S1: Endplate osteophytes and bulging of the disc. Facet hypertrophy. No compressive central canal stenosis. Narrowing of the subarticular lateral recesses and neural foramina that could cause neural compression on either or both sides. No change since of temper 2020. IMPRESSION: Recent left-sided  laminotomy and discectomy at L2-3. Recurrent left posterolateral disc herniation with stenosis of the left lateral recess that could cause left-sided neural compression. Discogenic endplate edema and enhancement often seen at this level. No evidence of pseudomeningocele or soft tissue abscess. Previous laminotomies at L5-S1. Chronic bilateral subarticular lateral recess and neural foraminal stenosis at L5-S1 that could be symptomatic. Electronically Signed   By: Nelson Chimes M.D.   On: 11/28/2019 11:54    Recent Labs: Lab Results  Component Value Date   WBC 6.0 11/19/2019   HGB 12.6 11/19/2019   PLT 358 11/19/2019   NA 141 11/19/2019   K 4.5 11/19/2019   CL 103 11/19/2019   CO2 26 11/19/2019   GLUCOSE 90 11/19/2019   BUN 12 11/19/2019   CREATININE 0.66 11/19/2019   BILITOT 0.3 11/19/2019   ALKPHOS 134 (H) 11/19/2019   AST 15 11/19/2019   ALT 13 11/19/2019   PROT 7.0 11/19/2019   ALBUMIN 4.3 11/19/2019   CALCIUM 9.5 11/19/2019   GFRAA 113 11/19/2019    Speciality Comments: PLQ Eye Exam: 08/05/2019 WNL @ [facility] Follow up in 1 year  Procedures:  No procedures performed Allergies: Kiwi extract, Mucinex dm [dm-guaifenesin er], and Peach [prunus persica]   Assessment / Plan:     Visit Diagnoses: Rheumatoid arthritis involving multiple sites with positive rheumatoid factor ([place]) -  +RF, +CCP:  She has no joint tenderness or synovitis on exam.  She is clinically doing well on plaquenil 200 mg 1 tablet by mouth twice daily.  She is tolerating PLQ without any side effects.  She has not had any recent rheumatoid arthritis flares.  She continues to find PLQ to be effective at controlling her RA.  She experiences intermittent discomfort and stiffness in both hand and both knee joints but no inflammation was noted on exam.  She continues to use a rollator walker to assist with ambulation, and she was given a prescription for a 4-point cane to use.  If she starts having increased pain in both  hands more frequently we will schedule an ultrasound of both hands to assess for synovitis.  She will continue on the current treatment regimen.  She does not need a refill of PLQ at this time.  She was advised to notify us if she develops increased joint pain or joint swelling.  She will follow up in 5 months.   High risk medication use - Plaquenil 200 mg 1 tablet by mouth twice daily. PLQ eye exam: 08/05/19  at [facility].  CBC and CMP were drawn on 11/19/2019 and lab results were reviewed with the patient today in the office.  She will be due to update lab work in January and every 5 months to monitor for drug toxicity. She has received both COVID-19 vaccinations and is planning on receiving the third dose when she is eligible.  She has not had any recent infections.  She was advised to notify us if she develops a COVID-19 infection in order to receive the antibody infusion.  She voiced understanding.  Primary osteoarthritis of both hands: She has PIP and DIP thickening consistent with osteoarthritis of both hands.  No tenderness or inflammation was noted on exam.  She is able to make a complete fist bilaterally.  She experience occasional discomfort and stiffness in her hands which is typically exacerbated by weather changes.  Joint protection and muscle strengthening were discussed.   Primary osteoarthritis of both knees: She experiences intermittent discomfort in both knee joints. She has difficulty climbing steps and rising from a seated position.  She is unable to work due to the severity of pain in her knee joints and lower back. She has good ROM of both knees on exam.  No warmth or effusion noted.  She uses a rollator walker or a cane to assist with ambulation.  She was given a prescription for a 4-point cane for better stabilization.    Primary osteoarthritis of both feet: She is not experiencing any discomfort in her feet at this time. Ankle joints have good ROM with no discomfort. No tenderness  or inflammation noted.   Trochanteric bursitis of both hips: She experiences intermittent discomfort.  She has difficulty laying on her sides at night.   DDD (degenerative disc disease), lumbar: Chronic pain.  She had a left sided laminotomy and discectomy L2-33 performed by Dr. Louanne Skye about 8 months ago. She went to PT after surgery, but she continues to have chronic lower back pain and intermittent left-sided radiculopathy.  She has difficulty ambulating due to the severity of her discomfort at times.  She is unable to work due to her discomfort and limited mobility. She had a MRI of the lumbar spine performed on 11/28/19, which revealed recurrent left posterolateral disc hernation with stenosis of the left lateral recess. She has an upcoming appointment with Dr. Louanne Skye in 2 days to discuss results and recommendations.   Septic olecranon bursitis of left elbow - Resolved.  Closed avulsion fracture of left ankle with routine healing, subsequent encounter: She has no tenderness or inflammation on exam. She has occasional discomfort but is able to bear full weight.  She has been using a cane or rollator walker to assist with ambulation.   Other medical conditions are listed as follows:   Gastroesophageal reflux disease without esophagitis  Essential hypertension  Vitamin D deficiency  History of TIA (transient ischemic attack)  History of hyperlipidemia  Orders: No orders of the defined types were placed in this encounter.  No orders of the defined types were placed in this encounter.   Follow-Up Instructions: Return in about 5 months (around 05/10/2020) for Rheumatoid arthritis, Osteoarthritis.   Ofilia Neas, PA-C  Note - This record has been created using Dragon software.  Chart creation errors have been sought, but may not always  have been located. Such creation errors do not reflect on  the standard of medical care.

## 2019-11-27 NOTE — Telephone Encounter (Signed)
Patient wants a refill on everything to be sent to [facility] pill pack.

## 2019-11-28 ENCOUNTER — Ambulatory Visit
Admission: RE | Admit: 2019-11-28 | Discharge: 2019-11-28 | Disposition: A | Discharge status: Home / Self Care | Payer: PRIVATE HEALTH INSURANCE | Source: Ambulatory Visit | Attending: Specialist | Admitting: Specialist

## 2019-11-28 ENCOUNTER — Other Ambulatory Visit: Payer: Self-pay

## 2019-11-28 ENCOUNTER — Other Ambulatory Visit: Payer: Self-pay | Admitting: *Deleted

## 2019-11-28 ENCOUNTER — Telehealth: Payer: Self-pay

## 2019-11-28 DIAGNOSIS — M545 Low back pain, unspecified: Secondary | ICD-10-CM

## 2019-11-28 DIAGNOSIS — M5136 Other intervertebral disc degeneration, lumbar region: Secondary | ICD-10-CM

## 2019-11-28 DIAGNOSIS — M0579 Rheumatoid arthritis with rheumatoid factor of multiple sites without organ or systems involvement: Secondary | ICD-10-CM

## 2019-11-28 MED ORDER — HYDROXYCHLOROQUINE SULFATE 200 MG PO TABS: ORAL_TABLET | ORAL | 0 refills | Status: DC

## 2019-11-28 MED ORDER — GADOBENATE DIMEGLUMINE 529 MG/ML IV SOLN
20.0000 mL | Freq: Once | INTRAVENOUS | Status: AC | PRN
  Administered 2019-11-28: 20 mL via INTRAVENOUS

## 2019-11-28 NOTE — Telephone Encounter (Signed)
Received fax from [facility] for a refill on the pts. Ondansetron 4mg  pt. Last apt was 11/25/19.

## 2019-11-28 NOTE — Telephone Encounter (Signed)
Spoke to pharmacy and they have the scripts but stated that patient will need to call and so that she can give insurance information and answer some additional questions. Patient has been informed that she will need to call [facility] along with other pharmacies to make sure she is not getting duplicates

## 2019-11-28 NOTE — Telephone Encounter (Signed)
Last Visit:07/07/2019 Next Visit:12/09/2019 Labs: 11/19/2019 stable Eye exam: 08/05/2019 WNL  Current Dose per office note on 07/07/2019: Plaquenil 200 mg 1 tabletby mouthtwice daily  Okay to refill per Dr. Estanislado Pandy

## 2019-11-28 NOTE — Telephone Encounter (Signed)
I just sent the majority of her medicines to the pharmacy to be put in a 30-day bubble pack to make it easier for her.  I did not include this 1 since it is an as needed medication.  Before I refill this how often is she using this?

## 2019-12-01 NOTE — Telephone Encounter (Signed)
Okay, we need to get our stories together.  Please see recent other telephone messages.  I had 2 different telephone messages saying that she did want all of her regular medicines put in bubble wrap month supply through local pharmacy.  The last telephone message seem to confirm this.  Another telephone message in the interim suggested that no she wanted to keep everything refilled as usual.  Due to the number of numerous prescriptions I spent a lot of time last week sending all these with a special note edited for bubble wrap 30-day supply to local pharmacy  So between [place] and Quita Skye or whoever is getting these phone calls, we need some clarification.   Per the last phone message it was my understanding she wanted all of her regular daily medicines (not prn medicaiton like Zofran) sent for 30-day bubble pack which is what I did.  This did not include any medicines refilled by other providers such as rheumatology or orthopedics.  Lets make sure we are on the same page

## 2019-12-01 NOTE — Telephone Encounter (Signed)
I called the pt and she said she did not need this medication refilled.

## 2019-12-01 NOTE — Telephone Encounter (Signed)
The pt. Let me know that yes she did want all of her regular medication sent in at [facility] for the bubble pack. I called her because we received a fax from [facility] asking for a refill on her Zofran which is a as needed medication and the pt. Told me she did not need a refill on her Zofran.

## 2019-12-02 ENCOUNTER — Ambulatory Visit: Payer: PRIVATE HEALTH INSURANCE | Admitting: Orthopaedic Surgery

## 2019-12-03 ENCOUNTER — Telehealth: Payer: Self-pay | Admitting: Family Medicine

## 2019-12-03 ENCOUNTER — Other Ambulatory Visit: Payer: Self-pay | Admitting: Medical

## 2019-12-03 DIAGNOSIS — R110 Nausea: Secondary | ICD-10-CM

## 2019-12-03 MED ORDER — ONDANSETRON HCL 4 MG PO TABS: 4.0000 mg | ORAL_TABLET | Freq: Three times a day (TID) | ORAL | 0 refills | Status: DC | PRN

## 2019-12-03 NOTE — Telephone Encounter (Signed)
[facility] req Ondeansetron HCL 4 mg tab

## 2019-12-07 ENCOUNTER — Telehealth: Payer: Self-pay | Admitting: Medical

## 2019-12-07 NOTE — Telephone Encounter (Signed)
P.A. LINZESS  

## 2019-12-09 ENCOUNTER — Other Ambulatory Visit: Payer: Self-pay

## 2019-12-09 ENCOUNTER — Ambulatory Visit (INDEPENDENT_AMBULATORY_CARE_PROVIDER_SITE_OTHER): Payer: PRIVATE HEALTH INSURANCE | Admitting: Physician Assistant

## 2019-12-09 ENCOUNTER — Encounter: Payer: Self-pay | Admitting: Physician Assistant

## 2019-12-09 VITALS — BP 87/61 | HR 74 | Resp 17 | Ht 69.0 in | Wt 257.0 lb

## 2019-12-09 DIAGNOSIS — I10 Essential (primary) hypertension: Secondary | ICD-10-CM

## 2019-12-09 DIAGNOSIS — M71122 Other infective bursitis, left elbow: Secondary | ICD-10-CM

## 2019-12-09 DIAGNOSIS — M19072 Primary osteoarthritis, left ankle and foot: Secondary | ICD-10-CM

## 2019-12-09 DIAGNOSIS — M19042 Primary osteoarthritis, left hand: Secondary | ICD-10-CM

## 2019-12-09 DIAGNOSIS — M7062 Trochanteric bursitis, left hip: Secondary | ICD-10-CM

## 2019-12-09 DIAGNOSIS — M5136 Other intervertebral disc degeneration, lumbar region: Secondary | ICD-10-CM

## 2019-12-09 DIAGNOSIS — M0579 Rheumatoid arthritis with rheumatoid factor of multiple sites without organ or systems involvement: Secondary | ICD-10-CM | POA: Diagnosis not present

## 2019-12-09 DIAGNOSIS — M19071 Primary osteoarthritis, right ankle and foot: Secondary | ICD-10-CM

## 2019-12-09 DIAGNOSIS — E559 Vitamin D deficiency, unspecified: Secondary | ICD-10-CM

## 2019-12-09 DIAGNOSIS — S82892D Other fracture of left lower leg, subsequent encounter for closed fracture with routine healing: Secondary | ICD-10-CM

## 2019-12-09 DIAGNOSIS — K219 Gastro-esophageal reflux disease without esophagitis: Secondary | ICD-10-CM

## 2019-12-09 DIAGNOSIS — Z79899 Other long term (current) drug therapy: Secondary | ICD-10-CM | POA: Diagnosis not present

## 2019-12-09 DIAGNOSIS — Z8673 Personal history of transient ischemic attack (TIA), and cerebral infarction without residual deficits: Secondary | ICD-10-CM

## 2019-12-09 DIAGNOSIS — Z8639 Personal history of other endocrine, nutritional and metabolic disease: Secondary | ICD-10-CM

## 2019-12-09 DIAGNOSIS — M19041 Primary osteoarthritis, right hand: Secondary | ICD-10-CM | POA: Diagnosis not present

## 2019-12-09 DIAGNOSIS — M170 Bilateral primary osteoarthritis of knee: Secondary | ICD-10-CM | POA: Diagnosis not present

## 2019-12-09 DIAGNOSIS — M7061 Trochanteric bursitis, right hip: Secondary | ICD-10-CM

## 2019-12-09 IMAGING — DX DG RIBS W/ CHEST 3+V*R*
3 series · 3 of 3 positions shown · non-contrast
Comparison: 05/17/2015; 08/04/2013; chest CT-08/04/2013

CLINICAL DATA: Right-sided rib pain.  No known injury.

EXAM:
RIGHT RIBS AND CHEST - 3+ VIEW

[chest pa]
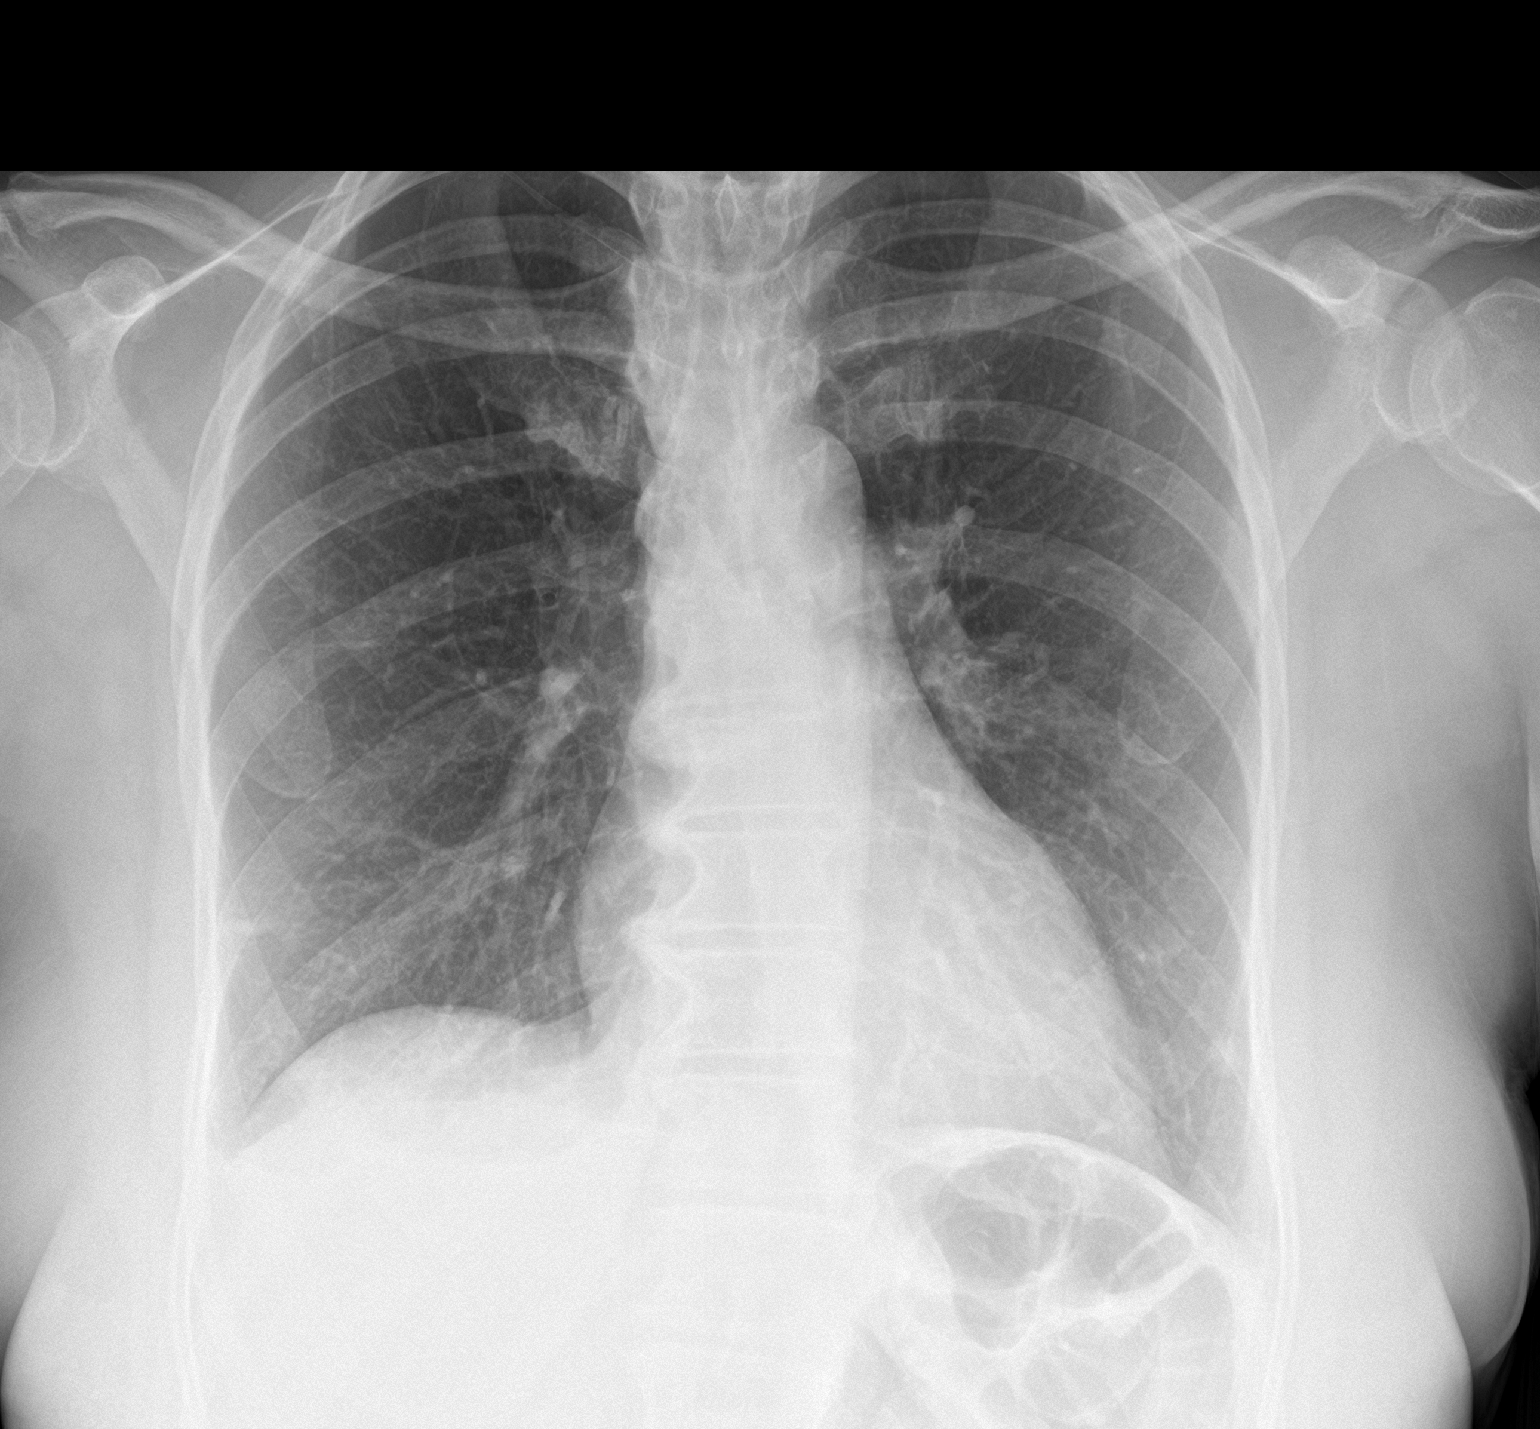

[rib obl]
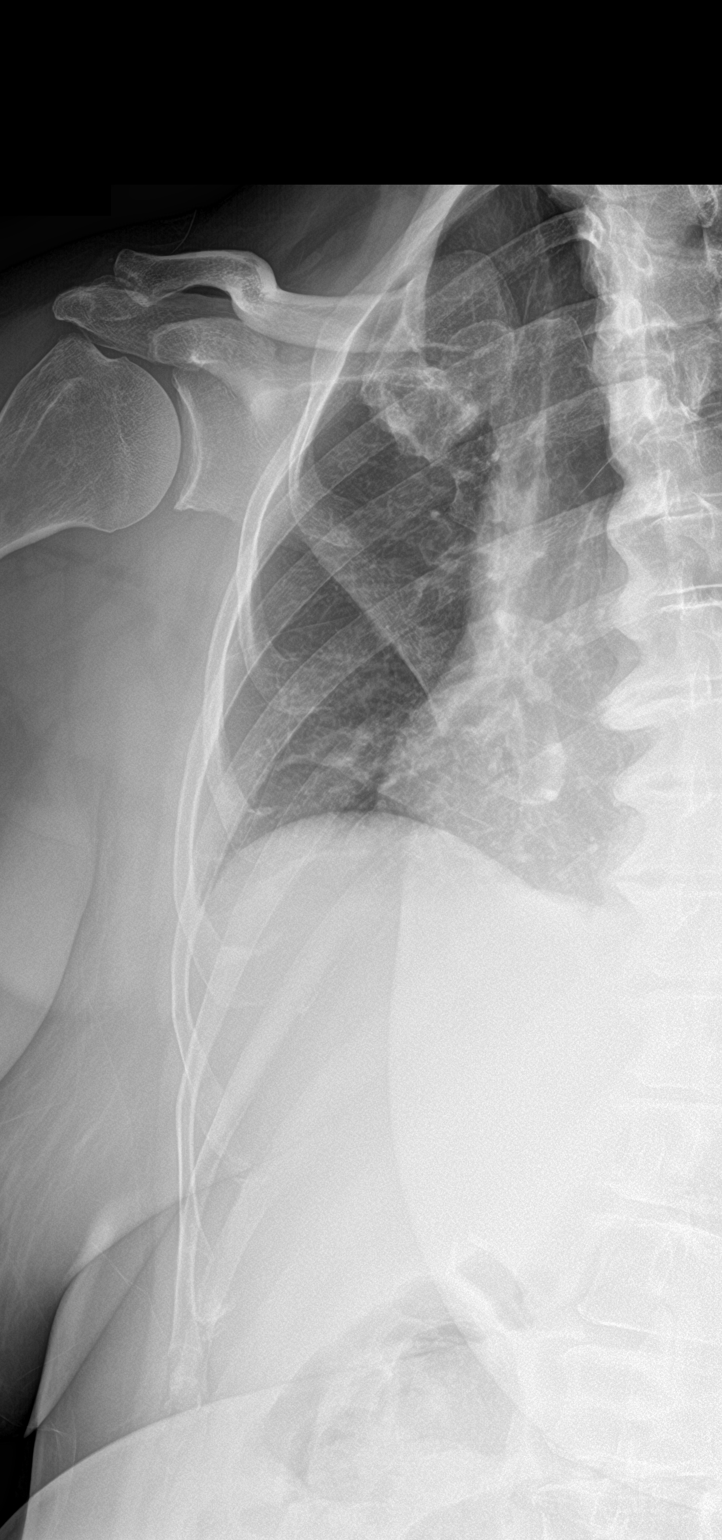

[chest ap]
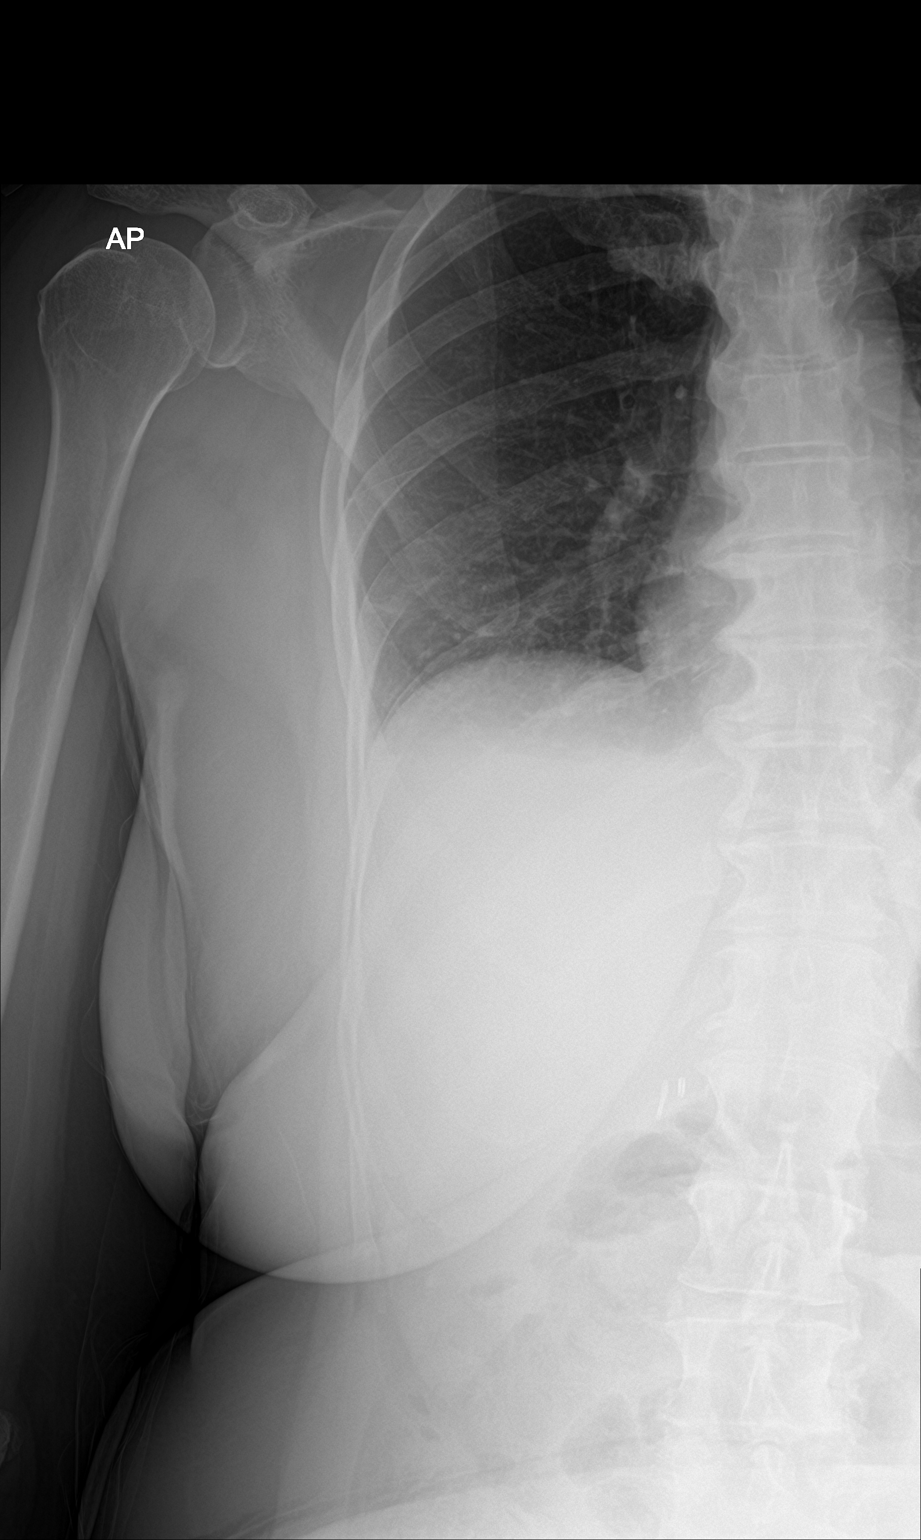

[3 of 3 positions shown; findings below may reference images not displayed]

FINDINGS: Grossly unchanged cardiac silhouette and mediastinal contours.
Suspected trace right-sided pleural effusion with associated right
basilar opacities. Previously characterized calcified granuloma
within the subpleural aspect the left lower lobe is unchanged
compared to remote chest radiograph performed [DATE]. No
pneumothorax. No evidence of edema.

No acute osseus abnormalities. Specifically, no definite displaced
right-sided rib fractures.

Post cholecystectomy.  Regional soft tissues appear normal.
IMPRESSION: 1. Trace right-sided effusion with associated right basilar
opacities, atelectasis versus infiltrate. A follow-up chest
radiograph in 3 to 4 weeks after treatment is recommended to ensure
resolution.
2. No definite displaced right-sided rib fractures.

## 2019-12-09 NOTE — Telephone Encounter (Signed)
P.A. not handled thru [facility].  Resent thru [email or web address] (rx benefits)

## 2019-12-09 NOTE — Patient Instructions (Signed)
COVID-19 vaccine recommendations:   COVID-19 vaccine is recommended for everyone (unless you are allergic to a vaccine component), even if you are on a medication that suppresses your immune system.   If you are on Methotrexate, Cellcept (mycophenolate), Rinvoq, Xeljanz, and Olumiant- hold the medication for 1 week after each vaccine. Hold Methotrexate for 2 weeks after the single dose COVID-19 vaccine.   If you are on Orencia subcutaneous injection - hold medication one week prior to and one week after the first COVID-19 vaccine dose (only).   If you are on Orencia IV infusions- time vaccination administration so that the first COVID-19 vaccination will occur four weeks after the infusion and postpone the subsequent infusion by one week.   If you are on Cyclophosphamide or Rituxan infusions please contact your doctor prior to receiving the COVID-19 vaccine.   Do not take Tylenol or any anti-inflammatory medications (NSAIDs) 24 hours prior to the COVID-19 vaccination.   There is no direct evidence about the efficacy of the COVID-19 vaccine in individuals who are on medications that suppress the immune system.   Even if you are fully vaccinated, and you are on any medications that suppress your immune system, please continue to wear a mask, maintain at least six feet social distance and practice hand hygiene.   If you develop a COVID-19 infection, please contact your PCP or our office to determine if you need antibody infusion.  The booster vaccine is now available for immunocompromised patients. It is advised that if you had Pfizer vaccine you should get Pfizer booster.  If you had a Moderna vaccine then you should get a Moderna booster. Johnson and Johnson does not have a booster vaccine at this time.  Please see the following web sites for updated information.    https://[email or web address]/[email or web address]/0/Files/COVID-19-Vaccination-Patient-Resources.pdf  https://[email or web address]/About-Us/[email or web address]-Releases/ID/1159    Standing Labs We placed an order today for your standing lab work.   Please have your standing labs drawn in January and every 5 months   If possible, please have your labs drawn 2 weeks prior to your appointment so that the provider can discuss your results at your appointment.  We have open lab daily Monday through Thursday from 8:30-12:30 PM and 1:30-4:30 PM and Friday from 8:30-12:30 PM and 1:30-4:00 PM at the office of Dr. Shaili Deveshwar, [facility].   Please be advised, patients with office appointments requiring lab work will take precedents over walk-in lab work.  If possible, please come for your lab work on Monday and Friday afternoons, as you may experience shorter wait times. The office is located at [address], [address], [place], [place] [address] No appointment is necessary.   Labs are drawn by [facility]. Please bring your co-pay at the time of your lab draw.  You may receive a bill from [facility] for your lab work.  If you wish to have your labs drawn at another location, please call the office 24 hours in advance to send orders.  If you have any questions regarding directions or hours of operation,  please call [phone].   As a reminder, please drink plenty of water prior to coming for your lab work. Thanks!   

## 2019-12-11 ENCOUNTER — Encounter: Payer: Self-pay | Admitting: Specialist

## 2019-12-11 ENCOUNTER — Ambulatory Visit (INDEPENDENT_AMBULATORY_CARE_PROVIDER_SITE_OTHER): Payer: PRIVATE HEALTH INSURANCE | Admitting: Specialist

## 2019-12-11 ENCOUNTER — Other Ambulatory Visit: Payer: Self-pay

## 2019-12-11 VITALS — BP 118/82 | HR 86 | Ht 69.0 in | Wt 257.0 lb

## 2019-12-11 DIAGNOSIS — R29898 Other symptoms and signs involving the musculoskeletal system: Secondary | ICD-10-CM | POA: Diagnosis not present

## 2019-12-11 DIAGNOSIS — M25512 Pain in left shoulder: Secondary | ICD-10-CM

## 2019-12-11 DIAGNOSIS — M25511 Pain in right shoulder: Secondary | ICD-10-CM

## 2019-12-11 DIAGNOSIS — M5126 Other intervertebral disc displacement, lumbar region: Secondary | ICD-10-CM

## 2019-12-11 DIAGNOSIS — M5136 Other intervertebral disc degeneration, lumbar region: Secondary | ICD-10-CM

## 2019-12-11 DIAGNOSIS — G8929 Other chronic pain: Secondary | ICD-10-CM

## 2019-12-11 DIAGNOSIS — Z9889 Other specified postprocedural states: Secondary | ICD-10-CM

## 2019-12-11 NOTE — Progress Notes (Signed)
Office Visit Note   [name]           Date of Birth: [birth year]           MRN: [id number] Visit Date: 12/11/2019              Requested by: Carlena Hurl, PA-C [address] [place],  [place] [address] PCP: Carlena Hurl, PA-C   Assessment & Plan: Visit Diagnoses:  1. S/P lumbar laminectomy   2. Weakness of shoulder   3. Chronic pain of both shoulders   4. Chronic left shoulder pain   5. DDD (degenerative disc disease), lumbar   6. Recurrent herniation of lumbar disc     Plan: Avoid frequent bending and stooping  No lifting greater than 10 lbs. May use ice or moist heat for pain. Weight loss is of benefit. Best medication for lumbar disc disease is arthritis medications like motrin, celebrex and naprosyn. Exercise is important to improve your indurance and does allow people to function better inspite of back pain. Physical therapy to try an improve her functional level, she does not wish to consider redo hemilaminectomy at this point I am concerned as she has multiple level lumbar degenerative disc disease and if fusions were  Undertaken she is likely to remain totally disabled, I have advised her to apply for social security Disablility.   Follow-Up Instructions: Return in about 5 weeks (around 01/15/2020).   Orders:  Orders Placed This Encounter  Procedures  . Ambulatory referral to Physical Therapy   No orders of the defined types were placed in this encounter.     Procedures: No procedures performed   Clinical Data: No additional findings.   Subjective: Chief Complaint  Patient presents with  . Lower Back - Follow-up    MRI Review    59 year old female with history of previous left L2-3 microdiscectomy. He has recurrent pain and radiation into the left leg greater than the right. She is having pain into the right shoulder and the arms. MRI of the right shoulder with tendonitis changes, she does not want surgery on the either the  lower back and also on the right shoulder and is currently in PT for her right shoulder. She is  In a  2 level home and has trouble going up and down stairs. She is having primarily pain into the left lumbar spine and some into the left buttocks and the left mid upper lumbar spine. Has constipation, She is taking gabapentin and no narcotics.    Review of Systems  Constitutional: Negative.   HENT: Negative.   Eyes: Negative.   Respiratory: Negative.   Cardiovascular: Negative.   Gastrointestinal: Negative.   Endocrine: Negative.   Musculoskeletal: Positive for back pain and gait problem. Negative for arthralgias, joint swelling, myalgias, neck pain and neck stiffness.  Skin: Negative.  Negative for color change, pallor, rash and wound.  Neurological: Positive for weakness.     Objective: Vital Signs: BP 118/82 (BP Location: Left Arm, Patient Position: Sitting)   Pulse 86   Ht 5\' 9"  (1.753 m)   Wt 257 lb (116.6 kg)   LMP 02/23/2015 (LMP Unknown)   BMI 37.95 kg/m   Physical Exam  Ortho Exam  Specialty Comments:  No specialty comments available.  Imaging: No results found.   PMFS History: Patient Active Problem List   Diagnosis Date Noted  . 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    Priority: High    Class:  Chronic  . Spinal stenosis of lumbar region 04/25/2019    Priority: High    Class: Chronic  . Menopause 11/25/2019  . Cardiomyopathy ([place]) 11/19/2019  . BMI 38.0-38.9,adult 11/19/2019  . Rheumatoid arthritis involving multiple sites with positive rheumatoid factor ([place]) 11/19/2019  . Primary osteoarthritis involving multiple joints 11/19/2019  . Trochanteric bursitis 11/19/2019  . Decreased activities of daily living (ADL) 11/19/2019  . Gait disturbance 11/19/2019  . Chronic gastritis without bleeding 11/19/2019  . Use of cane as ambulatory aid 11/19/2019  . Pain in left shoulder 10/02/2019  . Status post lumbar laminectomy 04/25/2019  . Neck  pain 11/12/2018  . Chronic pain of right knee 09/13/2018  . Bruising 09/13/2018  . SOB (shortness of breath) 08/30/2018  . Chronic pain of left knee 07/08/2018  . Chronic hip pain 07/08/2018  . Edema 07/08/2018  . Left foot pain 04/08/2018  . Olecranon bursitis of left elbow 04/05/2018  . Leg swelling 12/05/2017  . Need for shingles vaccine 12/05/2017  . Primary osteoarthritis of both hands 11/23/2017  . Rheumatoid factor positive 11/23/2017  . Primary osteoarthritis of both knees 11/23/2017  . Primary osteoarthritis of both feet 11/23/2017  . DDD (degenerative disc disease), lumbar 11/23/2017  . Vaccine counseling 08/13/2017  . Polyarthralgia 08/13/2017  . Joint stiffness 08/13/2017  . Sensitive skin 01/23/2017  . Need for influenza vaccination 01/23/2017  . Proteinuria 01/23/2017  . History of gastrointestinal stromal tumor (GIST) 04/20/2016  . History of TIA (transient ischemic attack) 04/20/2016  . Screening for breast cancer 04/20/2016  . Estrogen deficiency 04/20/2016  . Chronic maxillary sinusitis 04/20/2016  . Impaired fasting blood sugar 04/20/2016  . Constipation 04/20/2016  . History of fall 04/20/2016  . Chronic radicular lumbar pain 01/27/2016  . Encounter for health maintenance examination in adult 03/22/2015  . Paresthesia 03/22/2015  . Cognitive decline 03/22/2015  . Screening for cervical cancer 03/22/2015  . Vitamin D deficiency 03/22/2015  . History of uterine leiomyoma 03/22/2015  . Gastroesophageal reflux disease without esophagitis 03/02/2014  . Chronic nausea 03/02/2014  . Chronic abdominal pain 03/02/2014  . Rhinitis, allergic 03/02/2014  . Essential hypertension 03/02/2014  . Hyperlipidemia 03/02/2014  . Obesity with serious comorbidity 01/16/2012   Past Medical History:  Diagnosis Date  . Allergy   . Anemia    iron therapy for years as of 10/12; normal Hgb 08/2013  . Anxiety   . Arthritis   . Chest pain 04/05/2011   cardiac eval, normal  treadmill stress test, Dr. Tollie Eth  . Chronic back pain   . Constipation   . Farsightedness    wears glasses, [facility]  . Gastrointestinal stromal tumor (GIST) ([place]) 06/2014   Dr. [name], [facility]  . GERD (gastroesophageal reflux disease)   . History of uterine fibroid   . Hyperlipidemia   . Hypertension   . Paresthesia 09/2014   initially thought to be TIA, neurology consult in 12/2014 with other non TIA considerations.    . Polyarthralgia    normal rheumatoid screen 01/2012  . TIA (transient ischemic attack) 2015/16    Family History  Problem Relation Age of Onset  . Hypertension Mother   . Arthritis Mother   . GER disease Mother   . Glaucoma Mother   . Breast cancer Mother   . Stroke Father   . Breast cancer Sister        breast cancer dx late 51s  . Hypertension Sister   . Colon cancer Brother  65  . Diabetes Brother   . Diabetes Maternal Aunt   . Heart disease Maternal Aunt   . Heart disease Maternal Grandmother   . Heart disease Maternal Grandfather   . Heart disease Maternal Uncle   . Stroke Maternal Uncle   . Leukemia Sister   . Hypertension Sister   . Healthy Son   . Colon polyps Neg Hx   . Esophageal cancer Neg Hx   . Stomach cancer Neg Hx   . Rectal cancer Neg Hx     Past Surgical History:  Procedure Laterality Date  . COLONOSCOPY  01/2014   diverticulosis, othwerise normal - Dr. Owens Loffler  . ESOPHAGOGASTRODUODENOSCOPY  2013   Dr. Benson Norway, gastritis  . ESOPHAGOGASTRODUODENOSCOPY  [id number]  . EUS N/A 04/16/2014   Procedure: UPPER ENDOSCOPIC ULTRASOUND (EUS) LINEAR;  Surgeon: Milus Banister, MD;  Location: [facility];  Service: Endoscopy;  Laterality: N/A;  . gall stone surgery    . KNEE ARTHROSCOPY Left   . LAPAROSCOPIC GASTRIC RESECTION N/A 06/09/2014   Procedure: LAPAROSCOPIC GASTRIC MASS RESECTION;  Surgeon: [name], MD;  Location: [facility];  Service: General;  Laterality: N/A;  . LIPOMA EXCISION      forehead  . LUMBAR LAMINECTOMY N/A 04/25/2019   Procedure: LEFT L2-3 MICRODISCECTOMY, BILATERAL L5-S1 PARTIAL HEMILAMINECTOMY;  Surgeon: Jessy Oto, MD;  Location: [facility];  Service: Orthopedics;  Laterality: N/A;  . UPPER GASTROINTESTINAL ENDOSCOPY    . UTERINE FIBROID SURGERY     Social History   Occupational History  . Occupation: IT sales professional: [employer]  Tobacco Use  . Smoking status: Never Smoker  . Smokeless tobacco: Never Used  Vaping Use  . Vaping Use: Never used  Substance and Sexual Activity  . Alcohol use: No    Alcohol/week: 0.0 standard drinks  . Drug use: No  . Sexual activity: Not on file

## 2019-12-11 NOTE — Patient Instructions (Addendum)
Avoid frequent bending and stooping  No lifting greater than 10 lbs. May use ice or moist heat for pain. Weight loss is of benefit. Best medication for lumbar disc disease is arthritis medications like motrin, celebrex and naprosyn. Exercise is important to improve your indurance and does allow people to function better inspite of back pain. Physical therapy to try an improve her functional level, she does not wish to consider redo hemilaminectomy at this point I am concerned as she has multiple level lumbar degenerative disc disease and if fusions were  Undertaken she is likely to remain totally disabled, I have advised her to apply for social security Disablility.

## 2019-12-13 ENCOUNTER — Other Ambulatory Visit: Payer: Self-pay | Admitting: Specialist

## 2019-12-19 NOTE — Telephone Encounter (Signed)
P.A. approved til 12/10/20

## 2019-12-22 NOTE — Telephone Encounter (Signed)
Left message for pt, faxed pharmacy  

## 2019-12-24 ENCOUNTER — Ambulatory Visit: Payer: PRIVATE HEALTH INSURANCE | Admitting: Medical

## 2019-12-24 ENCOUNTER — Other Ambulatory Visit: Payer: Self-pay

## 2019-12-30 ENCOUNTER — Ambulatory Visit (INDEPENDENT_AMBULATORY_CARE_PROVIDER_SITE_OTHER): Payer: PRIVATE HEALTH INSURANCE | Admitting: Orthopaedic Surgery

## 2019-12-30 ENCOUNTER — Encounter: Payer: Self-pay | Admitting: Orthopaedic Surgery

## 2019-12-30 ENCOUNTER — Other Ambulatory Visit: Payer: Self-pay

## 2019-12-30 VITALS — Ht 69.0 in | Wt 257.0 lb

## 2019-12-30 DIAGNOSIS — M25512 Pain in left shoulder: Secondary | ICD-10-CM | POA: Diagnosis not present

## 2019-12-30 DIAGNOSIS — G8929 Other chronic pain: Secondary | ICD-10-CM

## 2019-12-30 NOTE — Progress Notes (Signed)
Office Visit Note   [name]           Date of Birth: 17-Jul-1960           MRN: [id number] Visit Date: 12/30/2019              Requested by: Carlena Hurl, PA-C [address] [place],  [place] [address] PCP: Carlena Hurl, PA-C   Assessment & Plan: Visit Diagnoses:  1. Chronic left shoulder pain     Plan: Ms. Ilagan has been attending physical therapy for bilateral shoulder pain.  Her pain moves some right to left.  She has had a prior MRI scan in July of the left shoulder revealing some areas of tendinitis and AC joint arthritis.  She might have a partial tear of the biceps tendon.  Therapy is made a difference and that she is not having the popping clicking but still feels weak.  The physical therapy and the home exercise program will see her back in 2 months.  She is definitely better.  She still recuperating from her low back surgery and occasional use a walker or a four-point cane.  Those devices might aggravate her shoulder pain but hopefully over time she will continue to improve Follow-Up Instructions: Return in about 2 months (around 02/29/2020).   Orders:  No orders of the defined types were placed in this encounter.  No orders of the defined types were placed in this encounter.     Procedures: No procedures performed   Clinical Data: No additional findings.   Subjective: Chief Complaint  Patient presents with  . Left Shoulder - Follow-up  Patient presents today for a 6 week follow up on her left shoulder. She has been attending physical therapy at [facility] twice weekly. She states that she has noticed some improvement. Her shoulder does not pop as badly as before. She cannot pick up anything heavy. She has swelling superiorly, that alternates sides. She takes Gabapentin and Norco for pain.   HPI  Review of Systems   Objective: Vital Signs: Ht 5\' 9"  (1.753 m)   Wt 257 lb (116.6 kg)   LMP 02/23/2015 (LMP Unknown)   BMI 37.95 kg/m    Physical Exam Constitutional:      Appearance: She is well-developed.  Eyes:     Pupils: Pupils are equal, round, and reactive to light.  Pulmonary:     Effort: Pulmonary effort is normal.  Skin:    General: Skin is warm and dry.  Neurological:     Mental Status: She is alert and oriented to person, place, and time.  Psychiatric:        Behavior: Behavior normal.     Ortho Exam awake alert and oriented x3.  Comfortable sitting.  Painless motion with impingement testing both shoulders.  Appears to be weak but might be holding back.  No popping or clicking.  Multiple areas of tenderness about both shoulders.  I was able to passively place both arms fully overhead.  Good grip and good release.  Specialty Comments:  No specialty comments available.  Imaging: No results found.   PMFS History: Patient Active Problem List   Diagnosis Date Noted  . Menopause 11/25/2019  . Cardiomyopathy ([place]) 11/19/2019  . BMI 38.0-38.9,adult 11/19/2019  . Rheumatoid arthritis involving multiple sites with positive rheumatoid factor ([place]) 11/19/2019  . Primary osteoarthritis involving multiple joints 11/19/2019  . Trochanteric bursitis 11/19/2019  . Decreased activities of daily living (ADL) 11/19/2019  . Gait disturbance  11/19/2019  . Chronic gastritis without bleeding 11/19/2019  . Use of cane as ambulatory aid 11/19/2019  . Pain in left shoulder 10/02/2019  . 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 Spinal stenosis of lumbar region 04/25/2019    Class: Chronic  . Status post lumbar laminectomy 04/25/2019  . Neck pain 11/12/2018  . Chronic pain of right knee 09/13/2018  . Bruising 09/13/2018  . SOB (shortness of breath) 08/30/2018  . Chronic pain of left knee 07/08/2018  . Chronic hip pain 07/08/2018  . Edema 07/08/2018  . Left foot pain 04/08/2018  . Olecranon bursitis of left elbow 04/05/2018  . Leg swelling 12/05/2017  . Need for shingles vaccine  12/05/2017  . Primary osteoarthritis of both hands 11/23/2017  . Rheumatoid factor positive 11/23/2017  . Primary osteoarthritis of both knees 11/23/2017  . Primary osteoarthritis of both feet 11/23/2017  . DDD (degenerative disc disease), lumbar 11/23/2017  . Vaccine counseling 08/13/2017  . Polyarthralgia 08/13/2017  . Joint stiffness 08/13/2017  . Sensitive skin 01/23/2017  . Need for influenza vaccination 01/23/2017  . Proteinuria 01/23/2017  . History of gastrointestinal stromal tumor (GIST) 04/20/2016  . History of TIA (transient ischemic attack) 04/20/2016  . Screening for breast cancer 04/20/2016  . Estrogen deficiency 04/20/2016  . Chronic maxillary sinusitis 04/20/2016  . Impaired fasting blood sugar 04/20/2016  . Constipation 04/20/2016  . History of fall 04/20/2016  . Chronic radicular lumbar pain 01/27/2016  . Encounter for health maintenance examination in adult 03/22/2015  . Paresthesia 03/22/2015  . Cognitive decline 03/22/2015  . Screening for cervical cancer 03/22/2015  . Vitamin D deficiency 03/22/2015  . History of uterine leiomyoma 03/22/2015  . Gastroesophageal reflux disease without esophagitis 03/02/2014  . Chronic nausea 03/02/2014  . Chronic abdominal pain 03/02/2014  . Rhinitis, allergic 03/02/2014  . Essential hypertension 03/02/2014  . Hyperlipidemia 03/02/2014  . Obesity with serious comorbidity 01/16/2012   Past Medical History:  Diagnosis Date  . Allergy   . Anemia    iron therapy for years as of 10/12; normal Hgb 08/2013  . Anxiety   . Arthritis   . Chest pain 04/05/2011   cardiac eval, normal treadmill stress test, Dr. Tollie Eth  . Chronic back pain   . Constipation   . Farsightedness    wears glasses, Eye care center  . Gastrointestinal stromal tumor (GIST) ([place]) 06/2014   Dr. [name], [facility]  . GERD (gastroesophageal reflux disease)   . History of uterine fibroid   . Hyperlipidemia   . Hypertension    . Paresthesia 09/2014   initially thought to be TIA, neurology consult in 12/2014 with other non TIA considerations.    . Polyarthralgia    normal rheumatoid screen 01/2012  . TIA (transient ischemic attack) 2015/16    Family History  Problem Relation Age of Onset  . Hypertension Mother   . Arthritis Mother   . GER disease Mother   . Glaucoma Mother   . Breast cancer Mother   . Stroke Father   . Breast cancer Sister        breast cancer dx late 39s  . Hypertension Sister   . Colon cancer Brother 61  . Diabetes Brother   . Diabetes Maternal Aunt   . Heart disease Maternal Aunt   . Heart disease Maternal Grandmother   . Heart disease Maternal Grandfather   . Heart disease Maternal Uncle   . Stroke Maternal Uncle   .  Leukemia Sister   . Hypertension Sister   . Healthy Son   . Colon polyps Neg Hx   . Esophageal cancer Neg Hx   . Stomach cancer Neg Hx   . Rectal cancer Neg Hx     Past Surgical History:  Procedure Laterality Date  . COLONOSCOPY  01/2014   diverticulosis, othwerise normal - Dr. Owens Loffler  . ESOPHAGOGASTRODUODENOSCOPY  2013   Dr. Benson Norway, gastritis  . ESOPHAGOGASTRODUODENOSCOPY  [id number]  . EUS N/A 04/16/2014   Procedure: UPPER ENDOSCOPIC ULTRASOUND (EUS) LINEAR;  Surgeon: Milus Banister, MD;  [facility];  Service: Endoscopy;  Laterality: N/A;  . gall stone surgery    . KNEE ARTHROSCOPY Left   . LAPAROSCOPIC GASTRIC RESECTION N/A 06/09/2014   Procedure: LAPAROSCOPIC GASTRIC MASS RESECTION;  Surgeon: [name], MD;  [facility];  Service: General;  Laterality: N/A;  . LIPOMA EXCISION     forehead  . LUMBAR LAMINECTOMY N/A 04/25/2019   Procedure: LEFT L2-3 MICRODISCECTOMY, BILATERAL L5-S1 PARTIAL HEMILAMINECTOMY;  Surgeon: Jessy Oto, MD;  Location: [facility];  Service: Orthopedics;  Laterality: N/A;  . UPPER GASTROINTESTINAL ENDOSCOPY    . UTERINE FIBROID SURGERY     Social History   Occupational History  . Occupation: Horticulturist, commercial: [employer]  Tobacco Use  . Smoking status: Never Smoker  . Smokeless tobacco: Never Used  Vaping Use  . Vaping Use: Never used  Substance and Sexual Activity  . Alcohol use: No    Alcohol/week: 0.0 standard drinks  . Drug use: No  . Sexual activity: Not on file

## 2020-01-02 ENCOUNTER — Other Ambulatory Visit: Payer: Self-pay | Admitting: Rheumatology

## 2020-01-02 DIAGNOSIS — M0579 Rheumatoid arthritis with rheumatoid factor of multiple sites without organ or systems involvement: Secondary | ICD-10-CM

## 2020-01-02 NOTE — Telephone Encounter (Signed)
Last Visit: 12/09/2019 Next Visit: 05/11/2020 Labs: 11/19/2019 MCHC 31.4 , Alk. Phos 134 PLQ Eye Exam: 08/05/2019 WNL   Current Dose per office note on 12/09/2019: Plaquenil 200 mg 1 tablet by mouth twice daily Dx: Rheumatoid arthritis involving multiple sites with positive rheumatoid factor   Okay to refill PLQ?

## 2020-01-05 ENCOUNTER — Other Ambulatory Visit: Payer: Self-pay | Admitting: Specialist

## 2020-01-21 ENCOUNTER — Encounter: Payer: Self-pay | Admitting: Specialist

## 2020-01-21 ENCOUNTER — Other Ambulatory Visit: Payer: Self-pay

## 2020-01-21 ENCOUNTER — Ambulatory Visit (INDEPENDENT_AMBULATORY_CARE_PROVIDER_SITE_OTHER): Payer: PRIVATE HEALTH INSURANCE | Admitting: Specialist

## 2020-01-21 VITALS — BP 109/78 | HR 79 | Ht 69.0 in | Wt 257.0 lb

## 2020-01-21 DIAGNOSIS — M5126 Other intervertebral disc displacement, lumbar region: Secondary | ICD-10-CM

## 2020-01-21 DIAGNOSIS — M1712 Unilateral primary osteoarthritis, left knee: Secondary | ICD-10-CM | POA: Diagnosis not present

## 2020-01-21 DIAGNOSIS — M5136 Other intervertebral disc degeneration, lumbar region: Secondary | ICD-10-CM | POA: Diagnosis not present

## 2020-01-21 MED ORDER — ETODOLAC 400 MG PO TABS
ORAL_TABLET | ORAL | 0 refills | Status: DC
Start: 2020-01-21 — End: 2020-02-23

## 2020-01-21 NOTE — Patient Instructions (Signed)
Avoid frequent bending and stooping  No lifting greater than 10 lbs. May use ice or moist heat for pain. Weight loss is of benefit. Best medication for lumbar disc disease is arthritis medications like motrin, celebrex and naprosyn. Exercise is important to improve your indurance and does allow people to function better inspite of back pain.  The main ways of treat osteoarthritis, that are found to be success. Weight loss helps to decrease pain. Exercise is important to maintaining cartilage and thickness and strengthening. NSAIDs like motrin, tylenol, lodine are meds decreasing the inflamation. Ice is okay  In afternoon and evening and hot shower in the am Straight leg exercises for the left knee.

## 2020-01-21 NOTE — Progress Notes (Signed)
Office Visit Note   [name]           Date of Birth: 04-May-1960           MRN: [id number] Visit Date: 01/21/2020              Requested by: Carlena Hurl, PA-C [id number] [place],  [place] [id number] PCP: Carlena Hurl, PA-C   Assessment & Plan: Visit Diagnoses:  1. Recurrent herniation of lumbar disc   2. Unilateral primary osteoarthritis, left knee   3. DDD (degenerative disc disease), lumbar   59 year old female with lumbar laminectomy, persistent pain and sciatica and repeat MRI show a recurrent HNP. She has been to PT and reports pain is overall much better and stength is improving. She is functioning on a higher level with less need for narcotic medications to relieve her pain. Clinically no neural tension signs today and strength is normal. She is improving and based on this continued conservative treatment is appropriate. Will treat with NSAIDs and neuromembrane stablizers.   Plan: Avoid frequent bending and stooping  No lifting greater than 10 lbs. May use ice or moist heat for pain. Weight loss is of benefit. Best medication for lumbar disc disease is arthritis medications like motrin, celebrex and naprosyn. Exercise is important to improve your indurance and does allow people to function better inspite of back pain.  The main ways of treat osteoarthritis, that are found to be success. Weight loss helps to decrease pain. Exercise is important to maintaining cartilage and thickness and strengthening. NSAIDs like motrin, tylenol, lodine are meds decreasing the inflamation. Ice is okay  In afternoon and evening and hot shower in the am Straight leg exercises for the left knee.   Follow-Up Instructions: Return in about 4 weeks (around 02/18/2020).   Orders:  No orders of the defined types were placed in this encounter.  Meds ordered this encounter  Medications  . etodolac (LODINE) 400 MG tablet    Sig: TAKE (1) TABLET TWICE DAILY AFTER MEALS.     Dispense:  60 tablet    Refill:  0      Procedures: No procedures performed   Clinical Data: No additional findings.   Subjective: Chief Complaint  Patient presents with  . Lower Back - Follow-up    Doing better, has been to PT and it seems to be helping her. She is not doing any heavy lifting and it seems to be helping.    59 year old female with history of lumbar laminectomy for spinal stenosis 04/25/2019. She had difficulty with back and leg pains and was restarted in PT  With good improvement in her pain pattern. She is able to grocery shop but uses a cart as she feels weak; History of cardiomyopathy, and no diabetes. Recently her mother in law passed and she was able to help comfort her in the last moments. She is taking for pain some tylenol on occasion, has not taken etodolac. She has some pain in the left leg at night and she will stretch the leg and  Exercise it. She has had some catching sensation of the left knee and the therapist and her have to straighten the knee to get going.   Review of Systems  Constitutional: Negative.   HENT: Negative.   Eyes: Negative.   Respiratory: Negative.   Cardiovascular: Negative.   Gastrointestinal: Negative.   Endocrine: Positive for cold intolerance.  Genitourinary: Negative.   Musculoskeletal: Positive for  back pain and gait problem.  Skin: Negative.   Allergic/Immunologic: Negative.   Neurological: Positive for weakness and numbness.  Hematological: Negative.   Psychiatric/Behavioral: Negative.      Objective: Vital Signs: BP 109/78   Pulse 79   Ht 5\' 9"  (1.753 m)   Wt 257 lb (116.6 kg)   LMP 02/23/2015 (LMP Unknown)   BMI 37.95 kg/m   Physical Exam Constitutional:      Appearance: She is well-developed.  HENT:     Head: Normocephalic and atraumatic.  Eyes:     Pupils: Pupils are equal, round, and reactive to light.  Pulmonary:     Effort: Pulmonary effort is normal.     Breath sounds: Normal breath  sounds.  Abdominal:     General: Bowel sounds are normal.     Palpations: Abdomen is soft.  Musculoskeletal:        General: Normal range of motion.     Cervical back: Normal range of motion and neck supple.     Lumbar back: Negative right straight leg raise test and negative left straight leg raise test.  Skin:    General: Skin is warm and dry.  Neurological:     Mental Status: She is alert and oriented to person, place, and time.  Psychiatric:        Behavior: Behavior normal.        Thought Content: Thought content normal.        Judgment: Judgment normal.     Back Exam   Range of Motion  Extension: normal  Flexion: normal  Lateral bend right: normal  Lateral bend left: normal  Rotation right: normal  Rotation left: normal   Muscle Strength  Right Quadriceps:  5/5  Left Quadriceps:  4/5  Right Hamstrings:  5/5  Left Hamstrings:  5/5   Tests  Straight leg raise right: negative Straight leg raise left: negative  Reflexes  Patellar: 1/4 Achilles: 2/4 Babinski's sign: normal       Specialty Comments:  No specialty comments available.  Imaging: No results found.   PMFS History: Patient Active Problem List   Diagnosis Date Noted  . 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    Priority: High    Class: Chronic  . Spinal stenosis of lumbar region 04/25/2019    Priority: High    Class: Chronic  . Menopause 11/25/2019  . Cardiomyopathy ([place]) 11/19/2019  . BMI 38.0-38.9,adult 11/19/2019  . Rheumatoid arthritis involving multiple sites with positive rheumatoid factor ([place]) 11/19/2019  . Primary osteoarthritis involving multiple joints 11/19/2019  . Trochanteric bursitis 11/19/2019  . Decreased activities of daily living (ADL) 11/19/2019  . Gait disturbance 11/19/2019  . Chronic gastritis without bleeding 11/19/2019  . Use of cane as ambulatory aid 11/19/2019  . Pain in left shoulder 10/02/2019  . Status post lumbar laminectomy 04/25/2019  .  Neck pain 11/12/2018  . Chronic pain of right knee 09/13/2018  . Bruising 09/13/2018  . SOB (shortness of breath) 08/30/2018  . Chronic pain of left knee 07/08/2018  . Chronic hip pain 07/08/2018  . Edema 07/08/2018  . Left foot pain 04/08/2018  . Olecranon bursitis of left elbow 04/05/2018  . Leg swelling 12/05/2017  . Need for shingles vaccine 12/05/2017  . Primary osteoarthritis of both hands 11/23/2017  . Rheumatoid factor positive 11/23/2017  . Primary osteoarthritis of both knees 11/23/2017  . Primary osteoarthritis of both feet 11/23/2017  . DDD (degenerative disc disease), lumbar 11/23/2017  . Vaccine counseling 08/13/2017  .  Polyarthralgia 08/13/2017  . Joint stiffness 08/13/2017  . Sensitive skin 01/23/2017  . Need for influenza vaccination 01/23/2017  . Proteinuria 01/23/2017  . History of gastrointestinal stromal tumor (GIST) 04/20/2016  . History of TIA (transient ischemic attack) 04/20/2016  . Screening for breast cancer 04/20/2016  . Estrogen deficiency 04/20/2016  . Chronic maxillary sinusitis 04/20/2016  . Impaired fasting blood sugar 04/20/2016  . Constipation 04/20/2016  . History of fall 04/20/2016  . Chronic radicular lumbar pain 01/27/2016  . Encounter for health maintenance examination in adult 03/22/2015  . Paresthesia 03/22/2015  . Cognitive decline 03/22/2015  . Screening for cervical cancer 03/22/2015  . Vitamin D deficiency 03/22/2015  . History of uterine leiomyoma 03/22/2015  . Gastroesophageal reflux disease without esophagitis 03/02/2014  . Chronic nausea 03/02/2014  . Chronic abdominal pain 03/02/2014  . Rhinitis, allergic 03/02/2014  . Essential hypertension 03/02/2014  . Hyperlipidemia 03/02/2014  . Obesity with serious comorbidity 01/16/2012   Past Medical History:  Diagnosis Date  . Allergy   . Anemia    iron therapy for years as of 10/12; normal Hgb 08/2013  . Anxiety   . Arthritis   . Chest pain 04/05/2011   cardiac eval, normal  treadmill stress test, Dr. Tollie Eth  . Chronic back pain   . Constipation   . Farsightedness    wears glasses, Eye care center  . Gastrointestinal stromal tumor (GIST) ([place]) 06/2014   Dr. [name], [facility]  . GERD (gastroesophageal reflux disease)   . History of uterine fibroid   . Hyperlipidemia   . Hypertension   . Paresthesia 09/2014   initially thought to be TIA, neurology consult in 12/2014 with other non TIA considerations.    . Polyarthralgia    normal rheumatoid screen 01/2012  . TIA (transient ischemic attack) 2015/16    Family History  Problem Relation Age of Onset  . Hypertension Mother   . Arthritis Mother   . GER disease Mother   . Glaucoma Mother   . Breast cancer Mother   . Stroke Father   . Breast cancer Sister        breast cancer dx late 15s  . Hypertension Sister   . Colon cancer Brother 69  . Diabetes Brother   . Diabetes Maternal Aunt   . Heart disease Maternal Aunt   . Heart disease Maternal Grandmother   . Heart disease Maternal Grandfather   . Heart disease Maternal Uncle   . Stroke Maternal Uncle   . Leukemia Sister   . Hypertension Sister   . Healthy Son   . Colon polyps Neg Hx   . Esophageal cancer Neg Hx   . Stomach cancer Neg Hx   . Rectal cancer Neg Hx     Past Surgical History:  Procedure Laterality Date  . COLONOSCOPY  01/2014   diverticulosis, othwerise normal - Dr. Owens Loffler  . ESOPHAGOGASTRODUODENOSCOPY  2013   Dr. Benson [place], gastritis  . ESOPHAGOGASTRODUODENOSCOPY  [id number]  . EUS N/A 04/16/2014   Procedure: UPPER ENDOSCOPIC ULTRASOUND (EUS) LINEAR;  Surgeon: Milus Banister, MD;  Location: [facility];  Service: Endoscopy;  Laterality: N/A;  . gall stone surgery    . KNEE ARTHROSCOPY Left   . LAPAROSCOPIC GASTRIC RESECTION N/A 06/09/2014   Procedure: LAPAROSCOPIC GASTRIC MASS RESECTION;  Surgeon: [name], MD;  Location: [facility];  Service: General;  Laterality: N/A;  . LIPOMA EXCISION      forehead  . LUMBAR LAMINECTOMY N/A 04/25/2019  Procedure: LEFT L2-3 MICRODISCECTOMY, BILATERAL L5-S1 PARTIAL HEMILAMINECTOMY;  Surgeon: Jessy Oto, MD;  Location: [facility];  Service: Orthopedics;  Laterality: N/A;  . UPPER GASTROINTESTINAL ENDOSCOPY    . UTERINE FIBROID SURGERY     Social History   Occupational History  . Occupation: IT sales professional: [employer]  Tobacco Use  . Smoking status: Never Smoker  . Smokeless tobacco: Never Used  Vaping Use  . Vaping Use: Never used  Substance and Sexual Activity  . Alcohol use: No    Alcohol/week: 0.0 standard drinks  . Drug use: No  . Sexual activity: Not on file

## 2020-02-01 ENCOUNTER — Other Ambulatory Visit: Payer: Self-pay | Admitting: Rheumatology

## 2020-02-01 DIAGNOSIS — M0579 Rheumatoid arthritis with rheumatoid factor of multiple sites without organ or systems involvement: Secondary | ICD-10-CM

## 2020-02-02 NOTE — Telephone Encounter (Signed)
Last Visit: 12/09/2019 Next Visit: 05/11/2020 Labs: 11/19/2019 MCHC 31.4 , Alk. Phos 134 PLQ Eye Exam: 08/05/2019 WNL   Current Dose per office note on 12/09/2019: Plaquenil 200 mg 1 tablet by mouth twice daily Dx: Rheumatoid arthritis involving multiple sites with positive rheumatoid factor   Okay to refill PLQ?  

## 2020-02-04 ENCOUNTER — Encounter: Payer: Self-pay | Admitting: Cardiology

## 2020-02-04 ENCOUNTER — Other Ambulatory Visit: Payer: Self-pay

## 2020-02-04 ENCOUNTER — Ambulatory Visit: Payer: PRIVATE HEALTH INSURANCE | Admitting: Cardiology

## 2020-02-04 VITALS — BP 132/81 | HR 75 | Ht 69.0 in | Wt 265.0 lb

## 2020-02-04 DIAGNOSIS — Z8673 Personal history of transient ischemic attack (TIA), and cerebral infarction without residual deficits: Secondary | ICD-10-CM

## 2020-02-04 DIAGNOSIS — I10 Essential (primary) hypertension: Secondary | ICD-10-CM

## 2020-02-04 DIAGNOSIS — I429 Cardiomyopathy, unspecified: Secondary | ICD-10-CM

## 2020-02-04 DIAGNOSIS — E782 Mixed hyperlipidemia: Secondary | ICD-10-CM

## 2020-02-04 NOTE — Progress Notes (Signed)
Lisa Briggs Date of Birth: 1960-12-21 MRN: [id number] Primary Care Provider:Tysinger, Camelia Eng, PA-C Former Cardiology Providers: Jeri Lager, APRN, FNP-C Primary Cardiologist: Rex Kras, DO, [facility] (established care 07/02/2019)  Date: 02/04/20 Last Office Visit: 08/06/2019  Chief Complaint  Patient presents with  . Cardiomyopathy    6 month   . Follow-up   HPI  Lisa Briggs is a 59 y.o.  female who presents to the office with a chief complaint of " cardiomyopathy follow-up." Patient's past medical history and cardiovascular risk factors include: Seropositive rheumatoid arthritis (Deveshwar), osteoarthritis, DDD, benign essential hypertension, transient ischemic attack, mildly reduced left ventricular systolic function consistent with cardiomyopathy, obesity.  Patient presents today for 6 months follow-up on her underlying cardiomyopathy which is presumed to be nonischemic.  Patient states that since last office visit she is doing well from a cardiovascular standpoint.  She has not been admitted to the hospital or seen in urgent care for cardiovascular symptoms.  She denies any chest pain or shortness of breath at rest or with effort related activities.  Patient appears euvolemic and is compliant with her medical therapy.  Unfortunately, patient has gained weight since last office visit this is secondary to decreased physical activity due to back pain.  Of note, patient last echocardiogram noted an LVEF of 45-50% and nuclear stress test reported normal myocardial perfusion.  She tolerated transition from hydrochlorothiazide as spironolactone without any side effects or intolerances.  Patient had recent blood work in August 2021 which is independently reviewed.  Kidney function remained stable and potassium within normal limits.  History of transient ischemic attack, cardiomyopathy (mildly reduced LVEF). Denies prior history of coronary artery disease, myocardial infarction, congestive  heart failure, deep venous thrombosis, pulmonary embolism, stroke.  FUNCTIONAL STATUS: Walks with a 4 wheel walker. No regular routine or exercise program.    ALLERGIES: Allergies  Allergen Reactions  . Kiwi Extract Hives and Itching  . Mucinex Dm [Dm-Guaifenesin Er] Nausea Only    Messes up her stomach. Pt report on 01/21/15  . Peach [Prunus Persica] Itching    Peach peeling makes pt itch     MEDICATION LIST PRIOR TO VISIT: Current Outpatient Medications on File Prior to Visit  Medication Sig Dispense Refill  . aspirin EC 81 MG tablet Take 1 tablet (81 mg total) by mouth daily. 90 tablet 3  . carvedilol (COREG) 6.25 MG tablet Take 1 tablet (6.25 mg total) by mouth 2 (two) times daily. 180 tablet 0  . cholecalciferol (VITAMIN D3) 25 MCG (1000 UNIT) tablet Take 1 tablet (1,000 Units total) by mouth daily. 90 tablet 3  . dexlansoprazole (DEXILANT) 60 MG capsule Take 1 capsule (60 mg total) by mouth daily. 90 capsule 3  . etodolac (LODINE) 400 MG tablet TAKE (1) TABLET TWICE DAILY AFTER MEALS. 60 tablet 0  . famotidine (PEPCID) 20 MG tablet TAKE 1 TABLET DAILY AT BEDTIME 90 tablet 3  . fexofenadine (ALLEGRA ALLERGY) 180 MG tablet Take 1 tablet (180 mg total) by mouth daily. 90 tablet 3  . fluticasone (FLONASE) 50 MCG/ACT nasal spray Place 1 spray into both nostrils daily. 16 g 11  . gabapentin (NEURONTIN) 300 MG capsule Take 1 capsule (300 mg total) by mouth 3 (three) times daily. 270 capsule 3  . hydroxychloroquine (PLAQUENIL) 200 MG tablet TAKE 1 TABLET TWICE DAILY FOR RHEUMATOID ARTHRITIS. 180 tablet 0  . linaclotide (LINZESS) 145 MCG CAPS capsule Take 1 capsule (145 mcg total) by mouth daily before breakfast. 90 capsule 1  .  losartan (COZAAR) 50 MG tablet Take 1 tablet (50 mg total) by mouth daily. 90 tablet 0  . Misc. Devices (ROLLATOR ULTRA-LIGHT) MISC Use as directed. 1 each 0  . ondansetron (ZOFRAN) 4 MG tablet Take 1 tablet (4 mg total) by mouth every 8 (eight) hours as needed  for nausea or vomiting. TAKE ONE TABLET BY MOUTH EVERY 8 HOURS AS NEEDED FOR  NAUSEA  OR  VOMITING 90 tablet 0  . potassium chloride (KLOR-CON) 10 MEQ tablet Take 1 tablet (10 mEq total) by mouth 2 (two) times daily. 180 tablet 3  . simvastatin (ZOCOR) 20 MG tablet Take 1 tablet (20 mg total) by mouth daily. 90 tablet 3  . venlafaxine XR (EFFEXOR XR) 37.5 MG 24 hr capsule Take 1 capsule (37.5 mg total) by mouth daily with breakfast. 90 capsule 0  . spironolactone (ALDACTONE) 25 MG tablet Take 1 tablet (25 mg total) by mouth daily. 90 tablet 0   No current facility-administered medications on file prior to visit.    PAST MEDICAL HISTORY: Past Medical History:  Diagnosis Date  . Allergy   . Anemia    iron therapy for years as of 10/12; normal Hgb 08/2013  . Anxiety   . Arthritis   . Chest pain 04/05/2011   cardiac eval, normal treadmill stress test, Dr. Tollie Eth  . Chronic back pain   . Constipation   . Farsightedness    wears glasses, [facility]  . 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 [name], [facility]  . GERD (gastroesophageal reflux disease)   . History of uterine fibroid   . Hyperlipidemia   . Hypertension   . Paresthesia 09/2014   initially thought to be TIA, neurology consult in 12/2014 with other non TIA considerations.    . Polyarthralgia    normal rheumatoid screen 01/2012  . TIA (transient ischemic attack) 2015/16    PAST SURGICAL HISTORY: Past Surgical History:  Procedure Laterality Date  . COLONOSCOPY  01/2014   diverticulosis, othwerise normal - Dr. Owens Loffler  . ESOPHAGOGASTRODUODENOSCOPY  2013   Dr. Benson Norway, gastritis  . ESOPHAGOGASTRODUODENOSCOPY  [id number]  . EUS N/A 04/16/2014   Procedure: UPPER ENDOSCOPIC ULTRASOUND (EUS) LINEAR;  Surgeon: Milus Banister, MD;  Location: [facility];  Service: Endoscopy;  Laterality: N/A;  . gall stone surgery    . KNEE ARTHROSCOPY Left   . LAPAROSCOPIC GASTRIC RESECTION  N/A 06/09/2014   Procedure: LAPAROSCOPIC GASTRIC MASS RESECTION;  Surgeon: [name], MD;  Location: [facility];  Service: General;  Laterality: N/A;  . LIPOMA EXCISION     forehead  . LUMBAR LAMINECTOMY N/A 04/25/2019   Procedure: LEFT L2-3 MICRODISCECTOMY, BILATERAL L5-S1 PARTIAL HEMILAMINECTOMY;  Surgeon: Jessy Oto, MD;  Location: [facility];  Service: Orthopedics;  Laterality: N/A;  . UPPER GASTROINTESTINAL ENDOSCOPY    . UTERINE FIBROID SURGERY      FAMILY HISTORY: The patient's family history includes Arthritis in her mother; Breast cancer in her mother and sister; Colon cancer (age of onset: 18) in her brother; Diabetes in her brother and maternal aunt; GER disease in her mother; Glaucoma in her mother; Healthy in her son; Heart disease in her maternal aunt, maternal grandfather, maternal grandmother, and maternal uncle; Hypertension in her mother, sister, and sister; Leukemia in her sister; Stroke in her father and maternal uncle.   SOCIAL HISTORY:  The patient  reports that she has never smoked. She has never used smokeless tobacco. She reports that  she does not drink alcohol and does not use drugs.  Review of Systems  Constitutional: Negative for chills, fever and weight gain.  HENT: Negative for ear discharge, ear pain and nosebleeds.   Eyes: Negative for blurred vision and discharge.  Cardiovascular: Negative for chest pain, claudication, dyspnea on exertion, leg swelling, near-syncope, orthopnea, palpitations, paroxysmal nocturnal dyspnea and syncope.  Respiratory: Negative for cough and shortness of breath.   Endocrine: Negative for polydipsia, polyphagia and polyuria.  Hematologic/Lymphatic: Negative for bleeding problem.  Skin: Negative for flushing and nail changes.  Musculoskeletal: Positive for arthritis and joint pain. Negative for muscle cramps, muscle weakness and myalgias.  Gastrointestinal: Positive for heartburn. Negative for abdominal pain, dysphagia, hematemesis,  hematochezia, melena, nausea and vomiting.  Neurological: Negative for dizziness, focal weakness and light-headedness.    PHYSICAL EXAM: Vitals with BMI 02/04/2020 01/21/2020 01/21/2020  Height 5\' 9"  5\' 9"  5\' 9"   Weight 265 lbs 257 lbs 257 lbs  BMI 39.12 68.12 75.17  Systolic 001 749 449  Diastolic 81 78 78  Pulse 75 79 79   CONSTITUTIONAL: Age-appropriate African-American female, obese, no acute distress, hemodynamically stable.  SKIN: Skin is warm and dry. No rash noted. No cyanosis. No pallor. No jaundice HEAD: Normocephalic and atraumatic.  EYES: No scleral icterus MOUTH/THROAT: Moist oral membranes.  NECK: No JVD present. No thyromegaly noted. No carotid bruits  LYMPHATIC: No visible cervical adenopathy.  CHEST Normal respiratory effort. No intercostal retractions  LUNGS: Clear to auscultation bilaterally.  No stridor. No wheezes. No rales.  CARDIOVASCULAR: Regular rate and rhythm, positive S1-S2, no murmurs rubs or gallops appreciated  ABDOMINAL: No apparent ascites.  EXTREMITIES: No peripheral edema. Left knee brace. HEMATOLOGIC: No significant bruising NEUROLOGIC: Oriented to person, place, and time. Nonfocal. Normal muscle tone.  PSYCHIATRIC: Normal mood and affect. Normal behavior. Cooperative  CARDIAC DATABASE: EKG: 02/04/2020: Sinus  Rhythm, 75bpm, normal axis, TWI V1-V2, without underlying injury pattern.   Echocardiogram: 04/17/2019: LVEF 67-59%, grade 2 diastolic dysfunction, elevated left atrial pressure, mild TR, no pulmonary hypertension.  06/05/2019: LVEF 45-50%, normal wall thickness, mild global hypokinesis, grade 1 diastolic impairment, normal left atrial pressure, mild MR, mild TR, mild TR, no pulmonary hypertension.  Stress Testing:  Lexiscan  Sestamibi Stress Test 07/09/2019: Myocardial perfusion is normal. Stress LV EF: 51%.  No previous exam available for comparison. Low risk study.   Heart Catheterization: None  LABORATORY DATA: CBC Latest Ref  Rng & Units 11/19/2019 04/26/2019 04/15/2019  WBC 3.4 - 10.8 x10E3/uL 6.0 11.7(H) 6.5  Hemoglobin 11.1 - 15.9 g/dL 12.6 11.3(L) 12.3  Hematocrit 34.0 - 46.6 % 40.1 36.4 41.6  Platelets 150 - 450 x10E3/uL 358 304 350    CMP Latest Ref Rng & Units 11/19/2019 08/13/2019 07/18/2019  Glucose 65 - 99 mg/dL 90 81 86  BUN 6 - 24 mg/dL 12 15 14   Creatinine 0.57 - 1.00 mg/dL 0.66 0.79 0.65  Sodium 134 - 144 mmol/L 141 144 143  Potassium 3.5 - 5.2 mmol/L 4.5 4.4 4.2  Chloride 96 - 106 mmol/L 103 105 103  CO2 20 - 29 mmol/L 26 26 27   Calcium 8.7 - 10.2 mg/dL 9.5 9.3 9.8  Total Protein 6.0 - 8.5 g/dL 7.0 - 7.0  Total Bilirubin 0.0 - 1.2 mg/dL 0.3 - <0.2  Alkaline Phos 48 - 121 IU/L 134(H) - 130(H)  AST 0 - 40 IU/L 15 - 19  ALT 0 - 32 IU/L 13 - 18    Lipid Panel     Component  Value Date/Time   CHOL 162 07/18/2019 1033   TRIG 70 07/18/2019 1033   HDL 77 07/18/2019 1033   CHOLHDL 2.1 07/18/2019 1033   CHOLHDL 2.1 04/20/2016 1122   VLDL 9 04/20/2016 1122   LDLCALC 71 07/18/2019 1033   LABVLDL 14 07/18/2019 1033    Lab Results  Component Value Date   HGBA1C 6.0 (H) 11/19/2019   HGBA1C 5.9 (A) 07/18/2019   HGBA1C 5.9 (H) 12/17/2018   No components found for: NTPROBNP Lab Results  Component Value Date   TSH 1.700 11/19/2019   TSH 1.860 08/30/2018   TSH 0.56 09/20/2016    Cardiac Panel (last 3 results) No results for input(s): CKTOTAL, CKMB, TROPONINIHS, RELINDX in the last 72 hours.  FINAL MEDICATION LIST END OF ENCOUNTER: No orders of the defined types were placed in this encounter.   There are no discontinued medications.   Current Outpatient Medications:  .  aspirin EC 81 MG tablet, Take 1 tablet (81 mg total) by mouth daily., Disp: 90 tablet, Rfl: 3 .  carvedilol (COREG) 6.25 MG tablet, Take 1 tablet (6.25 mg total) by mouth 2 (two) times daily., Disp: 180 tablet, Rfl: 0 .  cholecalciferol (VITAMIN D3) 25 MCG (1000 UNIT) tablet, Take 1 tablet (1,000 Units total) by mouth  daily., Disp: 90 tablet, Rfl: 3 .  dexlansoprazole (DEXILANT) 60 MG capsule, Take 1 capsule (60 mg total) by mouth daily., Disp: 90 capsule, Rfl: 3 .  etodolac (LODINE) 400 MG tablet, TAKE (1) TABLET TWICE DAILY AFTER MEALS., Disp: 60 tablet, Rfl: 0 .  famotidine (PEPCID) 20 MG tablet, TAKE 1 TABLET DAILY AT BEDTIME, Disp: 90 tablet, Rfl: 3 .  fexofenadine (ALLEGRA ALLERGY) 180 MG tablet, Take 1 tablet (180 mg total) by mouth daily., Disp: 90 tablet, Rfl: 3 .  fluticasone (FLONASE) 50 MCG/ACT nasal spray, Place 1 spray into both nostrils daily., Disp: 16 g, Rfl: 11 .  gabapentin (NEURONTIN) 300 MG capsule, Take 1 capsule (300 mg total) by mouth 3 (three) times daily., Disp: 270 capsule, Rfl: 3 .  hydroxychloroquine (PLAQUENIL) 200 MG tablet, TAKE 1 TABLET TWICE DAILY FOR RHEUMATOID ARTHRITIS., Disp: 180 tablet, Rfl: 0 .  linaclotide (LINZESS) 145 MCG CAPS capsule, Take 1 capsule (145 mcg total) by mouth daily before breakfast., Disp: 90 capsule, Rfl: 1 .  losartan (COZAAR) 50 MG tablet, Take 1 tablet (50 mg total) by mouth daily., Disp: 90 tablet, Rfl: 0 .  Misc. Devices (ROLLATOR ULTRA-LIGHT) MISC, Use as directed., Disp: 1 each, Rfl: 0 .  ondansetron (ZOFRAN) 4 MG tablet, Take 1 tablet (4 mg total) by mouth every 8 (eight) hours as needed for nausea or vomiting. TAKE ONE TABLET BY MOUTH EVERY 8 HOURS AS NEEDED FOR  NAUSEA  OR  VOMITING, Disp: 90 tablet, Rfl: 0 .  potassium chloride (KLOR-CON) 10 MEQ tablet, Take 1 tablet (10 mEq total) by mouth 2 (two) times daily., Disp: 180 tablet, Rfl: 3 .  simvastatin (ZOCOR) 20 MG tablet, Take 1 tablet (20 mg total) by mouth daily., Disp: 90 tablet, Rfl: 3 .  venlafaxine XR (EFFEXOR XR) 37.5 MG 24 hr capsule, Take 1 capsule (37.5 mg total) by mouth daily with breakfast., Disp: 90 capsule, Rfl: 0 .  spironolactone (ALDACTONE) 25 MG tablet, Take 1 tablet (25 mg total) by mouth daily., Disp: 90 tablet, Rfl: 0  IMPRESSION:    ICD-10-CM   1. Cardiomyopathy,  presumed nonischemic  I42.9 EKG 12-Lead  2. Benign hypertension  I10   3. Mixed hyperlipidemia  E78.2   4. History of TIA (transient ischemic attack)  Z86.73   5. Class 2 severe obesity due to excess calories with serious comorbidity and body mass index (BMI) of 39.0 to 39.9 in adult [facility])  E66.01    Z68.39      RECOMMENDATIONS: Lisa Briggs is a 59 y.o. female whose past medical history and cardiovascular risk factors include: Seropositive rheumatoid arthritis (Deveshwar), osteoarthritis, DDD, benign essential hypertension, transient ischemic attack, mildly reduced left ventricular systolic function consistent with cardiomyopathy, obesity.  Cardiomyopathy, presumed nonischemic:  Medications reconciled.    Patient tolerated the transition of spironolactone well without any side effects or intolerances.  Repeat blood work from August 2021 independently reviewed with the patient which notes stable kidney function and electrolytes.    Educated on importance of improving her modifiable cardiovascular risk factors.    Patient remains euvolemic since last office visit and no recent hospitalization for congestive heart failure.    History of TIA: Currently on aspirin and statin therapy.  Continue risk factor modifications for secondary prevention  Obesity, due to excess calories: Body mass index is 39.13 kg/m. . I reviewed with the patient the importance of diet, regular physical activity/exercise, weight loss.   . Patient is educated on increasing physical activity gradually as tolerated.  With the goal of moderate intensity exercise for 30 minutes a day 5 days a week.  Orders Placed This Encounter  Procedures  . EKG 12-Lead   --Continue cardiac medications as reconciled in final medication list. --Return in about 6 months (around 08/03/2020) for heart failure management.. Or sooner if needed. --Continue follow-up with your primary care physician regarding the management of your other  chronic comorbid conditions.  Patient's questions and concerns were addressed to her satisfaction. She voices understanding of the instructions provided during this encounter.   This note was created using a voice recognition software as a result there may be grammatical errors inadvertently enclosed that do not reflect the nature of this encounter. Every attempt is made to correct such errors.  Total time spent: 20 minutes.  Rex Kras, [place], [facility]  Pager: [phone] Office: [phone]

## 2020-02-06 ENCOUNTER — Ambulatory Visit: Payer: BC Managed Care – PPO | Admitting: Cardiology

## 2020-02-06 ENCOUNTER — Telehealth: Payer: Self-pay | Admitting: *Deleted

## 2020-02-06 NOTE — Telephone Encounter (Signed)
Received a fax from [facility] stating the regular refill of PLQ patient has at their pharmacy has an interaction with with other medication. Requesting okay to refill. Interaction in the prolonged Q-T interval. Per Dr. Estanislado Pandy patient will need to follow up with cardiologist about medications they have her on.  Attempted to contact the patient and no answer, unable to leave a message.

## 2020-02-18 ENCOUNTER — Ambulatory Visit (INDEPENDENT_AMBULATORY_CARE_PROVIDER_SITE_OTHER): Payer: PRIVATE HEALTH INSURANCE | Admitting: Specialist

## 2020-02-18 ENCOUNTER — Other Ambulatory Visit: Payer: Self-pay

## 2020-02-18 ENCOUNTER — Encounter: Payer: Self-pay | Admitting: Specialist

## 2020-02-18 VITALS — BP 124/85 | HR 89 | Ht 69.0 in | Wt 265.0 lb

## 2020-02-18 DIAGNOSIS — M25511 Pain in right shoulder: Secondary | ICD-10-CM

## 2020-02-18 DIAGNOSIS — G8929 Other chronic pain: Secondary | ICD-10-CM

## 2020-02-18 DIAGNOSIS — M25512 Pain in left shoulder: Secondary | ICD-10-CM

## 2020-02-18 DIAGNOSIS — M5136 Other intervertebral disc degeneration, lumbar region: Secondary | ICD-10-CM

## 2020-02-18 DIAGNOSIS — Z9889 Other specified postprocedural states: Secondary | ICD-10-CM

## 2020-02-18 DIAGNOSIS — M5126 Other intervertebral disc displacement, lumbar region: Secondary | ICD-10-CM

## 2020-02-18 DIAGNOSIS — M2242 Chondromalacia patellae, left knee: Secondary | ICD-10-CM

## 2020-02-18 DIAGNOSIS — R29898 Other symptoms and signs involving the musculoskeletal system: Secondary | ICD-10-CM

## 2020-02-18 DIAGNOSIS — M545 Low back pain, unspecified: Secondary | ICD-10-CM

## 2020-02-18 NOTE — Patient Instructions (Signed)
Plan: Knee is suffering from osteoarthritis, only real proven treatments are Weight loss, NSIADs like diclofenac and exercise. Well padded shoes help. Ice the knee that is suffering from osteoarthritis, only real proven treatments are Weight loss, NSIADs like diclofenac and exercise. Well padded shoes help. Ice the knee 2-3 times a day 15-20 mins at a time.-3 times a day 15-20 mins at a time. Hot showers in the AM.  Injection with steroid may be of benefit. Hemp CBD capsules, amazon.com 5,000-7,000 mg per bottle, 60 capsules per bottle, take one capsule twice a day. Cane in the right hand to use with left leg weight bearing. Follow-Up Instructions: No follow-ups on file.   Follow-Up Instructions: No follow-ups on file.

## 2020-02-18 NOTE — Progress Notes (Signed)
Office Visit Note   [name]           Date of Birth: 11-19-1960           MRN: [id number] Visit Date: 02/18/2020              Requested by: Lisa Hurl, PA-C [address] [place],  [place] [address] PCP: Lisa Hurl, PA-C   Assessment & Plan: Visit Diagnoses:  1. S/P lumbar laminectomy   2. DDD (degenerative disc disease), lumbar   3. Chronic left shoulder pain   4. Bilateral shoulder pain, unspecified chronicity   5. Low back pain without sciatica, unspecified back pain laterality, unspecified chronicity   6. Recurrent herniation of lumbar disc   7. Weakness of shoulder   8. Chondromalacia of left patellofemoral joint     Plan: Knee is suffering from osteoarthritis, only real proven treatments are Weight loss, NSIADs like diclofenac and exercise. Well padded shoes help. Ice the knee that is suffering from osteoarthritis, only real proven treatments are Weight loss, NSIADs like diclofenac and exercise. Well padded shoes help. Ice the knee 2-3 times a day 15-20 mins at a time.-3 times a day 15-20 mins at a time. Hot showers in the AM.  Injection with steroid may be of benefit. Hemp CBD capsules, [email or web address] 5,000-7,000 mg per bottle, 60 capsules per bottle, take one capsule twice a day. Cane in the right hand to use with left leg weight bearing. Follow-Up Instructions: No follow-ups on file.   Follow-Up Instructions: No follow-ups on file.   Orders:  No orders of the defined types were placed in this encounter.  No orders of the defined types were placed in this encounter.     Procedures: No procedures performed   Clinical Data: No additional findings.   Subjective: Chief Complaint  Patient presents with  . Lower Back - Follow-up    59 year old female with history of lumbar pain and discomfort into her legs underwent lumbar laminectomy surgery left L2-3 microdiscectomy and bilateral partial hemilaminectomies L5-S1 04/24/2019. She  has been having persistent pain since the surgery and  Has been using a walker for ambulation. Was seen in PT and they are transitioning her to the use of a cane. 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 She had history of left knee arthroscopy years ago in [facility]. If having pain in the left anterior knee. This is likely due to weakness residual left quadriceps and patellofemoral pain and radicular discomfort. Over all she is impoving with No real want for more surgery.   Review of Systems  Constitutional: Negative.   HENT: Negative.   Eyes: Negative.   Respiratory: Negative.   Cardiovascular: Negative.   Gastrointestinal: Negative.   Endocrine: Negative.   Genitourinary: Negative.   Musculoskeletal: Negative.   Skin: Negative.   Allergic/Immunologic: Negative.   Neurological: Negative.   Hematological: Negative.   Psychiatric/Behavioral: Negative.      Objective: Vital Signs: BP 124/85 (BP Location: Left Arm, Patient Position: Sitting)   Pulse 89   Ht 5\' 9"  (1.753 m)   Wt 265 lb (120.2 kg)   LMP 02/23/2015 (LMP Unknown)   BMI 39.13 kg/m   Physical Exam Constitutional:      Appearance: She is well-developed.  HENT:     Head: Normocephalic and atraumatic.  Eyes:     Pupils: Pupils are equal, round, and reactive to light.  Pulmonary:     Effort: Pulmonary effort is normal.  Breath sounds: Normal breath sounds.  Abdominal:     General: Bowel sounds are normal.     Palpations: Abdomen is soft.  Musculoskeletal:        General: Normal range of motion.     Cervical back: Normal range of motion and neck supple.     Left knee: No effusion.     Instability Tests: Medial McMurray test negative and lateral McMurray test negative.  Skin:    General: Skin is warm and dry.  Neurological:     Mental Status: She is alert and oriented to person, place, and time.  Psychiatric:        Behavior: Behavior normal.        Thought Content: Thought content  normal.        Judgment: Judgment normal.     Right Knee Exam  Right knee exam is normal.  Muscle Strength  The patient has normal right knee strength.   Left Knee Exam   Tenderness  The patient is experiencing tenderness in the patella.  Range of Motion  Extension: 0  Flexion: 130   Tests  McMurray:  Medial - negative Lateral - negative Varus: negative Valgus: negative Lachman:  Anterior - negative    Posterior - negative Drawer:  Anterior - negative     Posterior - negative Pivot shift: negative  Other  Erythema: absent Scars: absent Sensation: normal Swelling: mild Effusion: no effusion present      Specialty Comments:  No specialty comments available.  Imaging: No results found.   PMFS History: Patient Active Problem List   Diagnosis Date Noted  . 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    Priority: High    Class: Chronic  . Spinal stenosis of lumbar region 04/25/2019    Priority: High    Class: Chronic  . Menopause 11/25/2019  . Cardiomyopathy ([place]) 11/19/2019  . BMI 38.0-38.9,adult 11/19/2019  . Rheumatoid arthritis involving multiple sites with positive rheumatoid factor ([place]) 11/19/2019  . Primary osteoarthritis involving multiple joints 11/19/2019  . Trochanteric bursitis 11/19/2019  . Decreased activities of daily living (ADL) 11/19/2019  . Gait disturbance 11/19/2019  . Chronic gastritis without bleeding 11/19/2019  . Use of cane as ambulatory aid 11/19/2019  . Pain in left shoulder 10/02/2019  . Status post lumbar laminectomy 04/25/2019  . Neck pain 11/12/2018  . Chronic pain of right knee 09/13/2018  . Bruising 09/13/2018  . SOB (shortness of breath) 08/30/2018  . Chronic pain of left knee 07/08/2018  . Chronic hip pain 07/08/2018  . Edema 07/08/2018  . Left foot pain 04/08/2018  . Olecranon bursitis of left elbow 04/05/2018  . Leg swelling 12/05/2017  . Need for shingles vaccine 12/05/2017  . Primary  osteoarthritis of both hands 11/23/2017  . Rheumatoid factor positive 11/23/2017  . Primary osteoarthritis of both knees 11/23/2017  . Primary osteoarthritis of both feet 11/23/2017  . DDD (degenerative disc disease), lumbar 11/23/2017  . Vaccine counseling 08/13/2017  . Polyarthralgia 08/13/2017  . Joint stiffness 08/13/2017  . Sensitive skin 01/23/2017  . Need for influenza vaccination 01/23/2017  . Proteinuria 01/23/2017  . History of gastrointestinal stromal tumor (GIST) 04/20/2016  . History of TIA (transient ischemic attack) 04/20/2016  . Screening for breast cancer 04/20/2016  . Estrogen deficiency 04/20/2016  . Chronic maxillary sinusitis 04/20/2016  . Impaired fasting blood sugar 04/20/2016  . Constipation 04/20/2016  . History of fall 04/20/2016  . Chronic radicular lumbar pain 01/27/2016  . Encounter for health maintenance examination  in adult 03/22/2015  . Paresthesia 03/22/2015  . Cognitive decline 03/22/2015  . Screening for cervical cancer 03/22/2015  . Vitamin D deficiency 03/22/2015  . History of uterine leiomyoma 03/22/2015  . Gastroesophageal reflux disease without esophagitis 03/02/2014  . Chronic nausea 03/02/2014  . Chronic abdominal pain 03/02/2014  . Rhinitis, allergic 03/02/2014  . Essential hypertension 03/02/2014  . Hyperlipidemia 03/02/2014  . Obesity with serious comorbidity 01/16/2012   Past Medical History:  Diagnosis Date  . Allergy   . Anemia    iron therapy for years as of 10/12; normal Hgb 08/2013  . Anxiety   . Arthritis   . Chest pain 04/05/2011   cardiac eval, normal treadmill stress test, Dr. Tollie Eth  . Chronic back pain   . Constipation   . Farsightedness    wears glasses, [facility] care center  . Gastrointestinal stromal tumor (GIST) ([place]) 06/2014   Dr. [name], [facility]  . GERD (gastroesophageal reflux disease)   . History of uterine fibroid   . Hyperlipidemia   . Hypertension   . Paresthesia 09/2014    initially thought to be TIA, neurology consult in 12/2014 with other non TIA considerations.    . Polyarthralgia    normal rheumatoid screen 01/2012  . TIA (transient ischemic attack) 2015/16    Family History  Problem Relation Age of Onset  . Hypertension Mother   . Arthritis Mother   . GER disease Mother   . Glaucoma Mother   . Breast cancer Mother   . Stroke Father   . Breast cancer Sister        breast cancer dx late 40s  . Hypertension Sister   . Colon cancer Brother 80  . Diabetes Brother   . Diabetes Maternal Aunt   . Heart disease Maternal Aunt   . Heart disease Maternal Grandmother   . Heart disease Maternal Grandfather   . Heart disease Maternal Uncle   . Stroke Maternal Uncle   . Leukemia Sister   . Hypertension Sister   . Healthy Son   . Colon polyps Neg Hx   . Esophageal cancer Neg Hx   . Stomach cancer Neg Hx   . Rectal cancer Neg Hx     Past Surgical History:  Procedure Laterality Date  . COLONOSCOPY  01/2014   diverticulosis, othwerise normal - Dr. Owens Loffler  . ESOPHAGOGASTRODUODENOSCOPY  2013   Dr. Benson Norway, gastritis  . ESOPHAGOGASTRODUODENOSCOPY  [id number]  . EUS N/A 04/16/2014   Procedure: UPPER ENDOSCOPIC ULTRASOUND (EUS) LINEAR;  Surgeon: Milus Banister, MD;  Location: [facility];  Service: Endoscopy;  Laterality: N/A;  . gall stone surgery    . KNEE ARTHROSCOPY Left   . LAPAROSCOPIC GASTRIC RESECTION N/A 06/09/2014   Procedure: LAPAROSCOPIC GASTRIC MASS RESECTION;  Surgeon: [name], MD;  Location: [facility];  Service: General;  Laterality: N/A;  . LIPOMA EXCISION     forehead  . LUMBAR LAMINECTOMY N/A 04/25/2019   Procedure: LEFT L2-3 MICRODISCECTOMY, BILATERAL L5-S1 PARTIAL HEMILAMINECTOMY;  Surgeon: Jessy Oto, MD;  Location: [place];  Service: Orthopedics;  Laterality: N/A;  . UPPER GASTROINTESTINAL ENDOSCOPY    . UTERINE FIBROID SURGERY     Social History   Occupational History  . Occupation: IT sales professional: [employer]   Tobacco Use  . Smoking status: Never Smoker  . Smokeless tobacco: Never Used  Vaping Use  . Vaping Use: Never used  Substance and Sexual Activity  . Alcohol use: No  Alcohol/week: 0.0 standard drinks  . Drug use: No  . Sexual activity: Not on file

## 2020-02-19 ENCOUNTER — Telehealth: Payer: Self-pay

## 2020-02-19 ENCOUNTER — Ambulatory Visit (INDEPENDENT_AMBULATORY_CARE_PROVIDER_SITE_OTHER): Payer: PRIVATE HEALTH INSURANCE | Admitting: Medical

## 2020-02-19 ENCOUNTER — Encounter: Payer: Self-pay | Admitting: Medical

## 2020-02-19 VITALS — BP 132/88 | HR 89 | Ht 69.0 in | Wt 262.6 lb

## 2020-02-19 DIAGNOSIS — R7303 Prediabetes: Secondary | ICD-10-CM

## 2020-02-19 DIAGNOSIS — M0579 Rheumatoid arthritis with rheumatoid factor of multiple sites without organ or systems involvement: Secondary | ICD-10-CM

## 2020-02-19 DIAGNOSIS — I10 Essential (primary) hypertension: Secondary | ICD-10-CM | POA: Diagnosis not present

## 2020-02-19 DIAGNOSIS — I429 Cardiomyopathy, unspecified: Secondary | ICD-10-CM

## 2020-02-19 DIAGNOSIS — M5136 Other intervertebral disc degeneration, lumbar region: Secondary | ICD-10-CM

## 2020-02-19 DIAGNOSIS — E7800 Pure hypercholesterolemia, unspecified: Secondary | ICD-10-CM

## 2020-02-19 DIAGNOSIS — R7301 Impaired fasting glucose: Secondary | ICD-10-CM | POA: Diagnosis not present

## 2020-02-19 DIAGNOSIS — E559 Vitamin D deficiency, unspecified: Secondary | ICD-10-CM

## 2020-02-19 MED ORDER — BD PEN NEEDLE NANO U/F 32G X 4 MM MISC: 1.0000 | Freq: Every day | 11 refills | Status: DC

## 2020-02-19 MED ORDER — SEMAGLUTIDE-WEIGHT MANAGEMENT 0.5 MG/0.5ML ~~LOC~~ SOAJ: 0.5000 mg | SUBCUTANEOUS | 2 refills | Status: DC

## 2020-02-19 NOTE — Patient Instructions (Signed)
High blood pressure  Continue losartan 50 mg daily  Continue carvedilol 6.25 mg twice daily   Impaired fasting glucose/prediabetes  Your sugar number is stable  I recommend you continue efforts to lose weight through healthy low-fat diet, exercise, avoiding sweets junk food and processed food   Obesity  Continue efforts to lose weight through healthy eating habits, continue the intermittent fasting  We can consider other possible ways to work on this issue.  If you desire I can refer you to weight management clinic.  We could consider bariatric surgery consult.  Let me know if you want to pursue this   Vitamin D deficiency-continue vitamin D supplement 1000 units daily   High cholesterol  continue simvastatin 20 mg daily   Cardiomyopathy, high blood pressure-I reviewed her recent cardiology notes.  They advise you continue current medicine follow-up in 6 months   Low back pain, degenerative joint disease-I reviewed your recent orthopedic notes.  They advised several recommendations.  Continue routine follow-up with orthopedics   Follow up yearly for physical in August

## 2020-02-19 NOTE — Addendum Note (Signed)
Addended by: Carlena Hurl on: 02/19/2020 11:54 AM   Modules accepted: Orders, Level of Service

## 2020-02-19 NOTE — Progress Notes (Addendum)
Subjective:  Lisa Briggs is a 59 y.o. female who presents for Chief Complaint  Patient presents with  . Medication Management    with fasting labs      Here for med check.    Last visit at her physical she was prediabetic and we advised changes in lifestyle to lose weight.   She reports she is going to exercise, therapy.  Is trying to eat healthier, trying intermittent fasting from last visit.     HTN - not checking blood pressure at home.  She attributes elevated BP to the salt the restaurant put on her food yesterday.  Had recent covid booster  No other aggravating or relieving factors.    No other c/o.  The following portions of the patient's history were reviewed and updated as appropriate: allergies, current medications, past family history, past medical history, past social history, past surgical history and problem list.  ROS Otherwise as in subjective above   Objective: BP 132/88   Pulse 89   Ht 5\' 9"  (1.753 m)   Wt 262 lb 9.6 oz (119.1 kg)   LMP 02/23/2015 (LMP Unknown)   SpO2 91%   BMI 38.78 kg/m   General appearance: alert, no distress, well developed, well nourished neck: supple, no lymphadenopathy, no thyromegaly, no masses Heart: RRR, normal S1, S2, no murmurs Lungs: CTA bilaterally, no wheezes, rhonchi, or rales Pulses: 2+ radial pulses, 2+ pedal pulses, normal cap refill Ext: no edema   Assessment: Encounter Diagnoses  Name Primary?  . Essential hypertension Yes  . Cardiomyopathy, unspecified type ([place])   . Impaired fasting blood sugar   . DDD (degenerative disc disease), lumbar   . Rheumatoid arthritis involving multiple sites with positive rheumatoid factor ([place])   . Pure hypercholesterolemia   . Class 2 severe obesity with serious comorbidity in adult, unspecified BMI, unspecified obesity type ([place])   . Vitamin D deficiency   . Prediabetes      Plan: High blood pressure  Continue losartan 50 mg daily  Continue carvedilol 6.25 mg  twice daily   Impaired fasting glucose/prediabetes  Your sugar number is stable  I recommend you continue efforts to lose weight through healthy low-fat diet, exercise, avoiding sweets junk food and processed food   Obesity  Continue efforts to lose weight through healthy eating habits, continue the intermittent fasting  We can consider other possible ways to work on this issue.  If you desire I can refer you to weight management clinic.  We could consider bariatric surgery consult.  Let me know if you want to pursue this  Begin trial of Wegovy injection if covered by insurance.   Demonstrated use of pen device.  Discussed risks/benefits of medication  She declines referral to weight management clinic or baratric clinic.   Vitamin D deficiency-continue vitamin D supplement 1000 units daily   High cholesterol  continue simvastatin 20 mg daily   Cardiomyopathy, high blood pressure-I reviewed her recent cardiology notes.  They advise you continue current medicine follow-up in 6 months   Low back pain, degenerative joint disease-I reviewed your recent orthopedic notes.  They advised several recommendations.  Continue routine follow-up with orthopedics   Follow up yearly for physical in August  Lisa Briggs was seen today for medication management.  Diagnoses and all orders for this visit:  Essential hypertension  Cardiomyopathy, unspecified type ([place])  Impaired fasting blood sugar  DDD (degenerative disc disease), lumbar  Rheumatoid arthritis involving multiple sites with positive rheumatoid factor ([place])  Pure  hypercholesterolemia  Class 2 severe obesity with serious comorbidity in adult, unspecified BMI, unspecified obesity type ([place])  Vitamin D deficiency  Prediabetes  Other orders -     Insulin Pen Needle (BD PEN NEEDLE NANO U/F) 32G X 4 MM MISC; 1 each by Does not apply route at bedtime. -     Semaglutide-Weight Management 0.5 MG/0.5ML SOAJ; Inject 0.5 mg into  the skin once a week.    Follow up in 1 month regarding Wegovy weight loss medication

## 2020-02-19 NOTE — Telephone Encounter (Signed)
P.A. WEGOVY 

## 2020-02-21 ENCOUNTER — Other Ambulatory Visit: Payer: Self-pay | Admitting: Specialist

## 2020-03-02 ENCOUNTER — Encounter (HOSPITAL_COMMUNITY): Payer: Self-pay

## 2020-03-02 ENCOUNTER — Ambulatory Visit: Payer: PRIVATE HEALTH INSURANCE | Admitting: Orthopaedic Surgery

## 2020-03-02 ENCOUNTER — Other Ambulatory Visit: Payer: Self-pay

## 2020-03-02 ENCOUNTER — Ambulatory Visit (HOSPITAL_COMMUNITY)
Admission: EM | Admit: 2020-03-02 | Discharge: 2020-03-02 | Disposition: A | Discharge status: Home / Self Care | Payer: PRIVATE HEALTH INSURANCE | Attending: Family Medicine | Admitting: Family Medicine

## 2020-03-02 DIAGNOSIS — R5383 Other fatigue: Secondary | ICD-10-CM | POA: Diagnosis not present

## 2020-03-02 DIAGNOSIS — R0602 Shortness of breath: Secondary | ICD-10-CM | POA: Diagnosis not present

## 2020-03-02 DIAGNOSIS — R062 Wheezing: Secondary | ICD-10-CM | POA: Diagnosis not present

## 2020-03-02 DIAGNOSIS — B349 Viral infection, unspecified: Secondary | ICD-10-CM | POA: Diagnosis not present

## 2020-03-02 DIAGNOSIS — R059 Cough, unspecified: Secondary | ICD-10-CM | POA: Diagnosis not present

## 2020-03-02 DIAGNOSIS — Z20822 Contact with and (suspected) exposure to covid-19: Secondary | ICD-10-CM | POA: Insufficient documentation

## 2020-03-02 DIAGNOSIS — J3489 Other specified disorders of nose and nasal sinuses: Secondary | ICD-10-CM | POA: Diagnosis present

## 2020-03-02 DIAGNOSIS — R519 Headache, unspecified: Secondary | ICD-10-CM | POA: Diagnosis not present

## 2020-03-02 LAB — RESP PANEL BY RT-PCR (FLU A&B, COVID) ARPGX2
Influenza A by PCR: NEGATIVE
Influenza B by PCR: NEGATIVE
SARS Coronavirus 2 by RT PCR: NEGATIVE

## 2020-03-02 MED ORDER — PREDNISONE 10 MG (21) PO TBPK: ORAL_TABLET | Freq: Every day | ORAL | 0 refills | Status: AC

## 2020-03-02 MED ORDER — DEXAMETHASONE SODIUM PHOSPHATE 10 MG/ML IJ SOLN
INTRAMUSCULAR | Status: AC
  Filled 2020-03-02: qty 1

## 2020-03-02 MED ORDER — BENZONATATE 100 MG PO CAPS: 100.0000 mg | ORAL_CAPSULE | Freq: Three times a day (TID) | ORAL | 0 refills | Status: DC

## 2020-03-02 MED ORDER — DEXAMETHASONE SODIUM PHOSPHATE 10 MG/ML IJ SOLN
10.0000 mg | Freq: Once | INTRAMUSCULAR | Status: AC
  Administered 2020-03-02: 10 mg via INTRAMUSCULAR

## 2020-03-02 NOTE — ED Provider Notes (Signed)
[place]   [id number] 03/02/20 Arrival Time: 2035   CC: COVID symptoms  SUBJECTIVE: History from: patient.  Lisa [facility]gs is a 59 y.o. female who presents with abrupt onset of nasal congestion, PND, wheezing and persistent dry cough for 1 day.  Reports that she was around her granddaughter during the holidays, and that her granddaughter had a cough and fever.  Denies sick exposure to COVID, flu or strep. Denies recent travel. Has negative history of Covid. Has completed Covid vaccines. Has not taken OTC medications for this. Shortness of breath and cough are aggravated by exertion. Denies previous symptoms in the past. Denies fever, chills, sinus pain, chest pain, nausea, changes in bowel or bladder habits.    ROS: As per HPI.  All other pertinent ROS negative.     Past Medical History:  Diagnosis Date   Allergy    Anemia    iron therapy for years as of 10/12; normal Hgb 08/2013   Anxiety    Arthritis    Chest pain 04/05/2011   cardiac eval, normal treadmill stress test, Dr. 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 [name], [facility]   GERD (gastroesophageal reflux disease)    History of uterine fibroid    Hyperlipidemia    Hypertension    Paresthesia 09/2014   initially thought to be TIA, neurology consult in 12/2014 with other non TIA considerations.     Polyarthralgia    normal rheumatoid screen 01/2012   TIA (transient ischemic attack) 2015/16   Past Surgical History:  Procedure Laterality Date   COLONOSCOPY  01/2014   diverticulosis, othwerise normal - Dr. Owens Loffler   ESOPHAGOGASTRODUODENOSCOPY  2013   Dr. Benson Norway, gastritis   ESOPHAGOGASTRODUODENOSCOPY  [id number]   EUS N/A 04/16/2014   Procedure: UPPER ENDOSCOPIC ULTRASOUND (EUS) LINEAR;  Surgeon: Milus Banister, MD;  [facility];   Service: Endoscopy;  Laterality: N/A;   gall stone surgery     KNEE ARTHROSCOPY Left    LAPAROSCOPIC GASTRIC RESECTION N/A 06/09/2014   Procedure: LAPAROSCOPIC GASTRIC MASS RESECTION;  Surgeon: [name], MD;  Location: [facility];  Service: General;  Laterality: N/A;   LIPOMA EXCISION     forehead   LUMBAR LAMINECTOMY N/A 04/25/2019   Procedure: LEFT L2-3 MICRODISCECTOMY, BILATERAL L5-S1 PARTIAL HEMILAMINECTOMY;  Surgeon: Jessy Oto, MD;  Location: [place];  Service: Orthopedics;  Laterality: N/A;   UPPER GASTROINTESTINAL ENDOSCOPY     UTERINE FIBROID SURGERY     Allergies  Allergen Reactions   Kiwi Extract Hives and Itching   Mucinex Dm [Dm-Guaifenesin Er] Nausea Only    Messes up her stomach. Pt report on 01/21/15   Peach [Prunus Persica] Itching    Peach peeling makes pt itch   No current facility-administered medications on file prior to encounter.   Current Outpatient Medications on File Prior to Encounter  Medication Sig Dispense Refill   aspirin EC 81 MG tablet Take 1 tablet (81 mg total) by mouth daily. 90 tablet 3   carvedilol (COREG) 6.25 MG tablet Take 1 tablet (6.25 mg total) by mouth 2 (two) times daily. 180 tablet 0   cholecalciferol (VITAMIN D3) 25 MCG (1000 UNIT) tablet Take 1 tablet (1,000 Units total) by mouth daily. 90 tablet 3   dexlansoprazole (DEXILANT) 60 MG capsule Take 1 capsule (60 mg total)  by mouth daily. 90 capsule 3   etodolac (LODINE) 400 MG tablet TAKE (1) TABLET TWICE DAILY AFTER MEALS. 56 tablet 0   famotidine (PEPCID) 20 MG tablet TAKE 1 TABLET DAILY AT BEDTIME 90 tablet 3   fexofenadine (ALLEGRA ALLERGY) 180 MG tablet Take 1 tablet (180 mg total) by mouth daily. 90 tablet 3   fluticasone (FLONASE) 50 MCG/ACT nasal spray Place 1 spray into both nostrils daily. 16 g 11   gabapentin (NEURONTIN) 300 MG capsule Take 1 capsule (300 mg total) by mouth 3 (three) times daily. 270 capsule 3   hydroxychloroquine (PLAQUENIL) 200 MG tablet  TAKE 1 TABLET TWICE DAILY FOR RHEUMATOID ARTHRITIS. 180 tablet 0   Insulin Pen Needle (BD PEN NEEDLE NANO U/F) 32G X 4 MM MISC 1 each by Does not apply route at bedtime. 100 each 11   linaclotide (LINZESS) 145 MCG CAPS capsule Take 1 capsule (145 mcg total) by mouth daily before breakfast. 90 capsule 1   losartan (COZAAR) 50 MG tablet Take 1 tablet (50 mg total) by mouth daily. 90 tablet 0   Misc. Devices (ROLLATOR ULTRA-LIGHT) MISC Use as directed. 1 each 0   ondansetron (ZOFRAN) 4 MG tablet Take 1 tablet (4 mg total) by mouth every 8 (eight) hours as needed for nausea or vomiting. TAKE ONE TABLET BY MOUTH EVERY 8 HOURS AS NEEDED FOR  NAUSEA  OR  VOMITING 90 tablet 0   potassium chloride (KLOR-CON) 10 MEQ tablet Take 1 tablet (10 mEq total) by mouth 2 (two) times daily. 180 tablet 3   Semaglutide-Weight Management 0.5 MG/0.5ML SOAJ Inject 0.5 mg into the skin once a week. 2 mL 2   simvastatin (ZOCOR) 20 MG tablet Take 1 tablet (20 mg total) by mouth daily. 90 tablet 3   spironolactone (ALDACTONE) 25 MG tablet Take 1 tablet (25 mg total) by mouth daily. 90 tablet 0   venlafaxine XR (EFFEXOR XR) 37.5 MG 24 hr capsule Take 1 capsule (37.5 mg total) by mouth daily with breakfast. 90 capsule 0   Social History   Socioeconomic History   Marital status: Married    Spouse name: Not on file   Number of children: 1   Years of education: Not on file   Highest education level: Not on file  Occupational History   Occupation: IT sales professional: [employer]  Tobacco Use   Smoking status: Never Smoker   Smokeless tobacco: Never Used  Vaping Use   Vaping Use: Never used  Substance and Sexual Activity   Alcohol use: No    Alcohol/week: 0.0 standard drinks   Drug use: No   Sexual activity: Not on file  Other Topics Concern   Not on file  Social History Narrative   Married, has 1 son in [place] and some grandchildren.  Glass blower/designer.  Active on job.  Does stretching  and exercises daily as per physical therapy.  Works 12 hours daily.     Social Determinants of Health   Financial Resource Strain:    Difficulty of Paying Living Expenses: Not on file  Food Insecurity:    Worried About Charity fundraiser in the Last Year: Not on file   [employer] of Food in the Last Year: Not on file  Transportation Needs:    Lack of Transportation (Medical): Not on file   Lack of Transportation (Non-Medical): Not on file  Physical Activity:    Days of Exercise per Week: Not on file   Minutes  of Exercise per Session: Not on file  Stress:    Feeling of Stress : Not on file  Social Connections:    Frequency of Communication with Friends and Family: Not on file   Frequency of Social Gatherings with Friends and Family: Not on file   Attends Religious Services: Not on file   Active Member of Clubs or Organizations: Not on file   Attends Archivist Meetings: Not on file   Marital Status: Not on file  Intimate Partner Violence:    Fear of Current or Ex-Partner: Not on file   Emotionally Abused: Not on file   Physically Abused: Not on file   Sexually Abused: Not on file   Family History  Problem Relation Age of Onset   Hypertension Mother    Arthritis Mother    GER disease Mother    Glaucoma Mother    Breast cancer Mother    Stroke Father    Breast cancer Sister        breast cancer dx late 17s   Hypertension Sister    Colon cancer Brother 77   Diabetes Brother    Diabetes Maternal Aunt    Heart disease Maternal Aunt    Heart disease Maternal Grandmother    Heart disease Maternal Grandfather    Heart disease Maternal Uncle    Stroke Maternal Uncle    Leukemia Sister    Hypertension Sister    Healthy Son    Colon polyps Neg Hx    Esophageal cancer Neg Hx    Stomach cancer Neg Hx    Rectal cancer Neg Hx     OBJECTIVE:  Vitals:   03/02/20 0837  BP: (!) 154/93  Pulse: 83  Resp: 20  Temp: 97.9 F  (36.6 C)  TempSrc: Oral  SpO2: 99%     General appearance: alert; appears fatigued, but nontoxic; speaking in full sentences and tolerating own secretions HEENT: NCAT; Ears: EACs clear, TMs pearly gray; Eyes: PERRL.  EOM grossly intact. Sinuses: nontender; Nose: nares patent with clear rhinorrhea, Throat: oropharynx erythematous, cobblestoning present tonsils non erythematous or enlarged, uvula midline  Neck: supple with LAD Lungs: unlabored respirations, symmetrical air entry; cough: absent; no respiratory distress; diffuse wheezing throughout bilateral lung fields Heart: regular rate and rhythm.  Radial pulses 2+ symmetrical bilaterally Skin: warm and dry Psychological: alert and cooperative; normal mood and affect  LABS:  No results found for this or any previous visit (from the past 24 hour(s)).   ASSESSMENT & PLAN:  1. Viral illness   2. Cough   3. Wheezing   4. Rhinorrhea   5. SOB (shortness of breath)   6. Other fatigue   7. Nonintractable headache, unspecified chronicity pattern, unspecified headache type     Meds ordered this encounter  Medications   predniSONE (STERAPRED UNI-PAK 21 TAB) 10 MG (21) TBPK tablet    Sig: Take by mouth daily for 6 days. Take 6 tablets on day 1, 5 tablets on day 2, 4 tablets on day 3, 3 tablets on day 4, 2 tablets on day 5, 1 tablet on day 6    Dispense:  21 tablet    Refill:  0    Order Specific Question:   Supervising Provider    Answer:   Chase Picket [[id number]]   benzonatate (TESSALON) 100 MG capsule    Sig: Take 1 capsule (100 mg total) by mouth every 8 (eight) hours.    Dispense:  21 capsule  Refill:  0    Order Specific Question:   Supervising Provider    Answer:   Chase Picket [[id number]]   dexamethasone (DECADRON) injection 10 mg   Prescribe steroid taper Prescribed benzonatate Decadron injection given in office Continue supportive care at home Respiratory viral panel testing ordered.  It will take between 1-2  days for test results.  Someone will contact you regarding abnormal results.    Patient should remain in quarantine until they have received Covid results.  If negative you may resume normal activities (go back to work/school) while practicing hand hygiene, social distance, and mask wearing.  If positive, patient should remain in quarantine for 10 days from symptom onset AND greater than 72 hours after symptoms resolution (absence of fever without the use of fever-reducing medication and improvement in respiratory symptoms), whichever is longer Get plenty of rest and push fluids Use OTC zyrtec for nasal congestion, runny nose, and/or sore throat Use OTC flonase for nasal congestion and runny nose Use medications daily for symptom relief Use OTC medications like ibuprofen or tylenol as needed fever or pain Call or go to the ED if you have any new or worsening symptoms such as fever, worsening cough, shortness of breath, chest tightness, chest pain, turning blue, changes in mental status.  Reviewed expectations re: course of current medical issues. Questions answered. Outlined signs and symptoms indicating need for more acute intervention. Patient verbalized understanding. After Visit Summary given.         Faustino Congress, NP 03/02/20 [phone]

## 2020-03-02 NOTE — ED Triage Notes (Signed)
Pt reports cough, runny nose, sore throat and wheezing x 1 day. Denies ever, chills, sob.,

## 2020-03-02 NOTE — Discharge Instructions (Addendum)
You have received a steroid injection in the office to help open you up.  Start oral steroids tomorrow. I have sent in a prednisone taper for you to take for 6 days. 6 tablets on day one, 5 tablets on day two, 4 tablets on day three, 3 tablets on day four, 2 tablets on day five, and 1 tablet on day six.  I have sent in [place] for you to use one capsule every 8 hours as needed for cough.  Your COVID and Flu tests are pending.  You should self quarantine until the test results are back.    Take Tylenol or ibuprofen as needed for fever or discomfort.  Rest and keep yourself hydrated.    Follow-up with your primary care provider if your symptoms are not improving.

## 2020-03-09 ENCOUNTER — Other Ambulatory Visit: Payer: Self-pay

## 2020-03-09 ENCOUNTER — Encounter: Payer: Self-pay | Admitting: Orthopaedic Surgery

## 2020-03-09 ENCOUNTER — Ambulatory Visit (INDEPENDENT_AMBULATORY_CARE_PROVIDER_SITE_OTHER): Payer: PRIVATE HEALTH INSURANCE | Admitting: Orthopaedic Surgery

## 2020-03-09 VITALS — Ht 69.0 in | Wt 262.0 lb

## 2020-03-09 DIAGNOSIS — M25512 Pain in left shoulder: Secondary | ICD-10-CM | POA: Diagnosis not present

## 2020-03-09 DIAGNOSIS — G8929 Other chronic pain: Secondary | ICD-10-CM | POA: Diagnosis not present

## 2020-03-09 NOTE — Progress Notes (Signed)
Office Visit Note   [name]           Date of Birth: January 24, 1961           MRN: [id number] Visit Date: 03/09/2020              Requested by: Carlena Hurl, PA-C [address] [place],  [place] [address] PCP: Carlena Hurl, PA-C   Assessment & Plan: Visit Diagnoses:  1. Chronic left shoulder pain     Plan: Lisa Briggs has been followed recently for the problems referable to her left shoulder.  She has been through a course of physical therapy notes that it is "much better".  She does not have the popping and cracking.  She did have an MRI scan in July demonstrating moderate supraspinatus and infraspinatus tendinopathy.  There was moderate tendinopathy and partial tearing of the intra-articular segment of the long head of the biceps and moderate degenerative changes at the [facility] joint.  Small glenohumeral joint effusion and a trace subacromial bursitis.  She is also been followed by Dr. Louanne Briggs for the chronic problem with her back.  At this point she is much better after therapy so I would suggest she continue with the exercises and over-the-counter medicines as necessary.  We will plan to see her back as needed.  Follow-Up Instructions: Return if symptoms worsen or fail to improve.   Orders:  No orders of the defined types were placed in this encounter.  No orders of the defined types were placed in this encounter.     Procedures: No procedures performed   Clinical Data: No additional findings.   Subjective: Chief Complaint  Patient presents with  . Left Shoulder - Follow-up  Patient presents today a two month follow up on her left shoulder. She states that she has noticed a lot of improvement. She is still attending physical therapy twice weekly. She takes Gabapentin and Tylenol for pain control.  HPI  Review of Systems   Objective: Vital Signs: Ht 5\' 9"  (1.753 m)   Wt 262 lb (118.8 kg)   LMP 02/23/2015 (LMP Unknown)   BMI 38.69 kg/m   Physical  Exam Constitutional:      Appearance: She is well-developed.  Eyes:     Pupils: Pupils are equal, round, and reactive to light.  Pulmonary:     Effort: Pulmonary effort is normal.  Skin:    General: Skin is warm and dry.  Neurological:     Mental Status: She is alert and oriented to person, place, and time.  Psychiatric:        Behavior: Behavior normal.     Ortho Exam awake alert and oriented x3.  Comfortable sitting.  BMI is about 39.  She was able to raise her left arm over her head with little if any discomfort and negative impingement tear.  No popping or cracking.  No significant localized areas of tenderness in the anterior lateral subacromial space.  No pain over the [facility] joint.  Good grip and good release. Specialty Comments:  No specialty comments available.  Imaging: No results found.   PMFS History: Patient Active Problem List   Diagnosis Date Noted  . Prediabetes 02/19/2020  . Menopause 11/25/2019  . Cardiomyopathy ([place]) 11/19/2019  . BMI 38.0-38.9,adult 11/19/2019  . Rheumatoid arthritis involving multiple sites with positive rheumatoid factor ([place]) 11/19/2019  . Primary osteoarthritis involving multiple joints 11/19/2019  . Trochanteric bursitis 11/19/2019  . Decreased activities of daily living (ADL) 11/19/2019  .  Gait disturbance 11/19/2019  . Chronic gastritis without bleeding 11/19/2019  . Use of cane as ambulatory aid 11/19/2019  . Pain in left shoulder 10/02/2019  . 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 Spinal stenosis of lumbar region 04/25/2019    Class: Chronic  . Status post lumbar laminectomy 04/25/2019  . Neck pain 11/12/2018  . Chronic pain of right knee 09/13/2018  . Bruising 09/13/2018  . SOB (shortness of breath) 08/30/2018  . Chronic pain of left knee 07/08/2018  . Chronic hip pain 07/08/2018  . Edema 07/08/2018  . Left foot pain 04/08/2018  . Olecranon bursitis of left elbow 04/05/2018  . Leg  swelling 12/05/2017  . Need for shingles vaccine 12/05/2017  . Primary osteoarthritis of both hands 11/23/2017  . Rheumatoid factor positive 11/23/2017  . Primary osteoarthritis of both knees 11/23/2017  . Primary osteoarthritis of both feet 11/23/2017  . DDD (degenerative disc disease), lumbar 11/23/2017  . Vaccine counseling 08/13/2017  . Polyarthralgia 08/13/2017  . Joint stiffness 08/13/2017  . Sensitive skin 01/23/2017  . Need for influenza vaccination 01/23/2017  . Proteinuria 01/23/2017  . History of gastrointestinal stromal tumor (GIST) 04/20/2016  . History of TIA (transient ischemic attack) 04/20/2016  . Screening for breast cancer 04/20/2016  . Estrogen deficiency 04/20/2016  . Chronic maxillary sinusitis 04/20/2016  . Impaired fasting blood sugar 04/20/2016  . Constipation 04/20/2016  . History of fall 04/20/2016  . Chronic radicular lumbar pain 01/27/2016  . Encounter for health maintenance examination in adult 03/22/2015  . Paresthesia 03/22/2015  . Cognitive decline 03/22/2015  . Screening for cervical cancer 03/22/2015  . Vitamin D deficiency 03/22/2015  . History of uterine leiomyoma 03/22/2015  . Gastroesophageal reflux disease without esophagitis 03/02/2014  . Chronic nausea 03/02/2014  . Chronic abdominal pain 03/02/2014  . Rhinitis, allergic 03/02/2014  . Essential hypertension 03/02/2014  . Hyperlipidemia 03/02/2014  . Obesity with serious comorbidity 01/16/2012   Past Medical History:  Diagnosis Date  . Allergy   . Anemia    iron therapy for years as of 10/12; normal Hgb 08/2013  . Anxiety   . Arthritis   . Chest pain 04/05/2011   cardiac eval, normal treadmill stress test, Dr. Tollie Eth  . Chronic back pain   . Constipation   . Farsightedness    wears glasses, [facility]  . 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 [name], [facility]  . GERD (gastroesophageal reflux disease)   . History of uterine  fibroid   . Hyperlipidemia   . Hypertension   . Paresthesia 09/2014   initially thought to be TIA, neurology consult in 12/2014 with other non TIA considerations.    . Polyarthralgia    normal rheumatoid screen 01/2012  . TIA (transient ischemic attack) 2015/16    Family History  Problem Relation Age of Onset  . Hypertension Mother   . Arthritis Mother   . GER disease Mother   . Glaucoma Mother   . Breast cancer Mother   . Stroke Father   . Breast cancer Sister        breast cancer dx late 71s  . Hypertension Sister   . Colon cancer Brother 15  . Diabetes Brother   . Diabetes Maternal Aunt   . Heart disease Maternal Aunt   . Heart disease Maternal Grandmother   . Heart disease Maternal Grandfather   . Heart disease Maternal Uncle   . Stroke Maternal  Uncle   . Leukemia Sister   . Hypertension Sister   . Healthy Son   . Colon polyps Neg Hx   . Esophageal cancer Neg Hx   . Stomach cancer Neg Hx   . Rectal cancer Neg Hx     Past Surgical History:  Procedure Laterality Date  . COLONOSCOPY  01/2014   diverticulosis, othwerise normal - Dr. Owens Loffler  . ESOPHAGOGASTRODUODENOSCOPY  2013   Dr. Benson Norway, gastritis  . ESOPHAGOGASTRODUODENOSCOPY  [id number]  . EUS N/A 04/16/2014   Procedure: UPPER ENDOSCOPIC ULTRASOUND (EUS) LINEAR;  Surgeon: Milus Banister, MD;  [facility];  Service: Endoscopy;  Laterality: N/A;  . gall stone surgery    . KNEE ARTHROSCOPY Left   . LAPAROSCOPIC GASTRIC RESECTION N/A 06/09/2014   Procedure: LAPAROSCOPIC GASTRIC MASS RESECTION;  Surgeon: [name], MD;  [facility];  Service: General;  Laterality: N/A;  . LIPOMA EXCISION     forehead  . LUMBAR LAMINECTOMY N/A 04/25/2019   Procedure: LEFT L2-3 MICRODISCECTOMY, BILATERAL L5-S1 PARTIAL HEMILAMINECTOMY;  Surgeon: Jessy Oto, MD;  Location: [place];  Service: Orthopedics;  Laterality: N/A;  . UPPER GASTROINTESTINAL ENDOSCOPY    . UTERINE FIBROID SURGERY     Social History    Occupational History  . Occupation: IT sales professional: [employer]  Tobacco Use  . Smoking status: Never Smoker  . Smokeless tobacco: Never Used  Vaping Use  . Vaping Use: Never used  Substance and Sexual Activity  . Alcohol use: No    Alcohol/week: 0.0 standard drinks  . Drug use: No  . Sexual activity: Not on file

## 2020-03-15 NOTE — Telephone Encounter (Signed)
P.A. was not handled thru [facility], resumbitted thru [employer] online with [email or web address]

## 2020-03-22 ENCOUNTER — Other Ambulatory Visit: Payer: Self-pay | Admitting: Specialist

## 2020-03-24 ENCOUNTER — Telehealth: Payer: Self-pay | Admitting: Medical

## 2020-03-24 ENCOUNTER — Other Ambulatory Visit: Payer: Self-pay

## 2020-03-24 ENCOUNTER — Encounter: Payer: Self-pay | Admitting: Specialist

## 2020-03-24 ENCOUNTER — Ambulatory Visit (INDEPENDENT_AMBULATORY_CARE_PROVIDER_SITE_OTHER): Payer: PRIVATE HEALTH INSURANCE | Admitting: Specialist

## 2020-03-24 VITALS — BP 120/83 | HR 91 | Ht 69.0 in | Wt 265.0 lb

## 2020-03-24 DIAGNOSIS — M5136 Other intervertebral disc degeneration, lumbar region: Secondary | ICD-10-CM | POA: Diagnosis not present

## 2020-03-24 DIAGNOSIS — M5126 Other intervertebral disc displacement, lumbar region: Secondary | ICD-10-CM | POA: Diagnosis not present

## 2020-03-24 DIAGNOSIS — M25512 Pain in left shoulder: Secondary | ICD-10-CM

## 2020-03-24 DIAGNOSIS — Z9889 Other specified postprocedural states: Secondary | ICD-10-CM

## 2020-03-24 DIAGNOSIS — G8929 Other chronic pain: Secondary | ICD-10-CM

## 2020-03-24 DIAGNOSIS — M1712 Unilateral primary osteoarthritis, left knee: Secondary | ICD-10-CM

## 2020-03-24 DIAGNOSIS — M2242 Chondromalacia patellae, left knee: Secondary | ICD-10-CM | POA: Diagnosis not present

## 2020-03-24 DIAGNOSIS — M545 Low back pain, unspecified: Secondary | ICD-10-CM

## 2020-03-24 DIAGNOSIS — M25511 Pain in right shoulder: Secondary | ICD-10-CM

## 2020-03-24 DIAGNOSIS — R29898 Other symptoms and signs involving the musculoskeletal system: Secondary | ICD-10-CM

## 2020-03-24 NOTE — Telephone Encounter (Signed)
I called phone but unable to leave voice mail.   Please call patient to cancel Thursday, 03/25/20 appt due to me/provider not feeling well.   See if any else has availability tomorrow.  If not try and get her back in soon.      I am sending this to [place]

## 2020-03-24 NOTE — Patient Instructions (Signed)
Plan: Avoid frequent bending and stooping  No lifting greater than 10 lbs. May use ice or moist heat for pain. Weight loss is of benefit. Best medication for lumbar disc disease is arthritis medications like motrin, celebrex and naprosyn. Exercise is important to improve your indurance and does allow people to function better inspite of back pain. Physical therapy for additional 8 sessions to maximize conservative management of the recurrent HNP lumbar spine. 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 Well padded shoes help. Ice the kneethatis suffering from osteoarthritis, only real proven treatments are Weight loss, NSIADs like diclofenac and exercise. Well padded shoes help. Ice the knee 2-3 times a day 15-20 mins at a time.-3 times a day 15-20 mins at a time. Hot showers in the AM.  Injection with steroid may be of benefit. Hemp CBD capsules, amazon.com 5,000-7,000 mg per bottle, 60 capsules per bottle, take one capsule twice a day. Cane in theright hand to use with left leg weight bearing. Follow-Up Instructions: No follow-ups on file.

## 2020-03-24 NOTE — Progress Notes (Signed)
Office Visit Note   [name]           Date of Birth: [birth year]           MRN: [id number] Visit Date: 03/24/2020              Requested by: Carlena Hurl, PA-C [address] [place],  [place] [address] PCP: Carlena Hurl, PA-C   Assessment & Plan: Visit Diagnoses:  1. S/P lumbar laminectomy   2. Recurrent herniation of lumbar disc   3. DDD (degenerative disc disease), lumbar   4. Chondromalacia of left patellofemoral joint   5. Weakness of shoulder   6. Unilateral primary osteoarthritis, left knee   7. Chronic pain of both shoulders   8. Low back pain without sciatica, unspecified back pain laterality, unspecified chronicity     Plan: Avoid frequent bending and stooping  No lifting greater than 10 lbs. May use ice or moist heat for pain. Weight loss is of benefit. Best medication for lumbar disc disease is arthritis medications like motrin, celebrex and naprosyn. Exercise is important to improve your indurance and does allow people to function better inspite of back pain. Physical therapy for additional 8 sessions to maximize conservative management of the recurrent HNP lumbar spine. 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 Well padded shoes help. Ice the knee that is suffering from osteoarthritis, only real proven treatments are Weight loss, NSIADs like diclofenac and exercise. Well padded shoes help. Ice the knee 2-3 times a day 15-20 mins at a time.-3 times a day 15-20 mins at a time. Hot showers in the AM.  Injection with steroid may be of benefit. Hemp CBD capsules, [email or web address] 5,000-7,000 mg per bottle, 60 capsules per bottle, take one capsule twice a day. Cane in the right hand to use with left leg weight bearing. Follow-Up Instructions: No follow-ups on file.   Follow-Up Instructions: Return in about 5 weeks (around 04/28/2020).   Orders:  No orders of the  defined types were placed in this encounter.  No orders of the defined types were placed in this encounter.     Procedures: No procedures performed   Clinical Data: No additional findings.   Subjective: Chief Complaint  Patient presents with  . Lower Back - Follow-up    59 year old female with history of previous lumbar discectomy Left L2-3 and bilateral lateral recess decompression 04/25/2019. She is in PT and is using an exercise bike and she is still using a cane intermittantly but is making progress. She is working with [facility] and may have as many as 8 more treatments. Bowel and Bladder, exercise bike and walking in stores pushing buggy or walking and may ride the cart.    Review of Systems  Constitutional: Negative.   HENT: Positive for congestion, postnasal drip, rhinorrhea, sinus pressure, sinus pain, sneezing and voice change.   Eyes: Negative.   Respiratory: Negative.   Cardiovascular: Negative.   Endocrine: Negative.   Genitourinary: Negative.   Musculoskeletal: Positive for back pain and gait problem.  Allergic/Immunologic: Negative.   Neurological: Positive for weakness and numbness.  Hematological: Negative.      Objective: Vital Signs: BP 120/83 (BP Location: Left Arm, Patient Position: Sitting)   Pulse 91   Ht 5\' 9"  (1.753 m)   Wt 265 lb (120.2 kg)   LMP 02/23/2015 (LMP Unknown)   BMI 39.13 kg/m   Physical Exam Constitutional:  Appearance: She is well-developed and well-nourished.  HENT:     Head: Normocephalic and atraumatic.  Eyes:     Extraocular Movements: EOM normal.     Pupils: Pupils are equal, round, and reactive to light.  Pulmonary:     Effort: Pulmonary effort is normal.     Breath sounds: Normal breath sounds.  Abdominal:     General: Bowel sounds are normal.     Palpations: Abdomen is soft.  Musculoskeletal:     Cervical back: Normal range of motion and neck supple.     Lumbar back: Negative right straight leg  raise test and negative left straight leg raise test.  Skin:    General: Skin is warm and dry.  Neurological:     Mental Status: She is alert and oriented to person, place, and time.  Psychiatric:        Mood and Affect: Mood and affect normal.        Behavior: Behavior normal.        Thought Content: Thought content normal.        Judgment: Judgment normal.     Back Exam   Tenderness  The patient is experiencing tenderness in the lumbar.  Range of Motion  Extension: abnormal  Flexion: abnormal  Lateral bend right: abnormal  Lateral bend left: abnormal  Rotation right: abnormal  Rotation left: abnormal   Muscle Strength  Right Quadriceps:  5/5  Left Quadriceps:  5/5  Right Hamstrings:  5/5  Left Hamstrings:  5/5   Tests  Straight leg raise right: negative Straight leg raise left: negative  Other  Toe walk: normal Heel walk: normal Sensation: normal Gait: normal  Erythema: no back redness Scars: present      Specialty Comments:  No specialty comments available.  Imaging: No results found.   PMFS History: Patient Active Problem List   Diagnosis Date Noted  . 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    Priority: High    Class: Chronic  . Spinal stenosis of lumbar region 04/25/2019    Priority: High    Class: Chronic  . Prediabetes 02/19/2020  . Menopause 11/25/2019  . Cardiomyopathy ([place]) 11/19/2019  . BMI 38.0-38.9,adult 11/19/2019  . Rheumatoid arthritis involving multiple sites with positive rheumatoid factor ([place]) 11/19/2019  . Primary osteoarthritis involving multiple joints 11/19/2019  . Trochanteric bursitis 11/19/2019  . Decreased activities of daily living (ADL) 11/19/2019  . Gait disturbance 11/19/2019  . Chronic gastritis without bleeding 11/19/2019  . Use of cane as ambulatory aid 11/19/2019  . Pain in left shoulder 10/02/2019  . Status post lumbar laminectomy 04/25/2019  . Neck pain 11/12/2018  . Chronic pain of right  knee 09/13/2018  . Bruising 09/13/2018  . SOB (shortness of breath) 08/30/2018  . Chronic pain of left knee 07/08/2018  . Chronic hip pain 07/08/2018  . Edema 07/08/2018  . Left foot pain 04/08/2018  . Olecranon bursitis of left elbow 04/05/2018  . Leg swelling 12/05/2017  . Need for shingles vaccine 12/05/2017  . Primary osteoarthritis of both hands 11/23/2017  . Rheumatoid factor positive 11/23/2017  . Primary osteoarthritis of both knees 11/23/2017  . Primary osteoarthritis of both feet 11/23/2017  . DDD (degenerative disc disease), lumbar 11/23/2017  . Vaccine counseling 08/13/2017  . Polyarthralgia 08/13/2017  . Joint stiffness 08/13/2017  . Sensitive skin 01/23/2017  . Need for influenza vaccination 01/23/2017  . Proteinuria 01/23/2017  . History of gastrointestinal stromal tumor (GIST) 04/20/2016  . History of TIA (transient  ischemic attack) 04/20/2016  . Screening for breast cancer 04/20/2016  . Estrogen deficiency 04/20/2016  . Chronic maxillary sinusitis 04/20/2016  . Impaired fasting blood sugar 04/20/2016  . Constipation 04/20/2016  . History of fall 04/20/2016  . Chronic radicular lumbar pain 01/27/2016  . Encounter for health maintenance examination in adult 03/22/2015  . Paresthesia 03/22/2015  . Cognitive decline 03/22/2015  . Screening for cervical cancer 03/22/2015  . Vitamin D deficiency 03/22/2015  . History of uterine leiomyoma 03/22/2015  . Gastroesophageal reflux disease without esophagitis 03/02/2014  . Chronic nausea 03/02/2014  . Chronic abdominal pain 03/02/2014  . Rhinitis, allergic 03/02/2014  . Essential hypertension 03/02/2014  . Hyperlipidemia 03/02/2014  . Obesity with serious comorbidity 01/16/2012   Past Medical History:  Diagnosis Date  . Allergy   . Anemia    iron therapy for years as of 10/12; normal Hgb 08/2013  . Anxiety   . Arthritis   . Chest pain 04/05/2011   cardiac eval, normal treadmill stress test, Dr. Tollie Eth  .  Chronic back pain   . Constipation   . Farsightedness    wears glasses, [facility] care center  . Gastrointestinal stromal tumor (GIST) ([place]) 06/2014   Dr. [name], [facility]  . GERD (gastroesophageal reflux disease)   . History of uterine fibroid   . Hyperlipidemia   . Hypertension   . Paresthesia 09/2014   initially thought to be TIA, neurology consult in 12/2014 with other non TIA considerations.    . Polyarthralgia    normal rheumatoid screen 01/2012  . TIA (transient ischemic attack) 2015/16    Family History  Problem Relation Age of Onset  . Hypertension Mother   . Arthritis Mother   . GER disease Mother   . Glaucoma Mother   . Breast cancer Mother   . Stroke Father   . Breast cancer Sister        breast cancer dx late 54s  . Hypertension Sister   . Colon cancer Brother 76  . Diabetes Brother   . Diabetes Maternal Aunt   . Heart disease Maternal Aunt   . Heart disease Maternal Grandmother   . Heart disease Maternal Grandfather   . Heart disease Maternal Uncle   . Stroke Maternal Uncle   . Leukemia Sister   . Hypertension Sister   . Healthy Son   . Colon polyps Neg Hx   . Esophageal cancer Neg Hx   . Stomach cancer Neg Hx   . Rectal cancer Neg Hx     Past Surgical History:  Procedure Laterality Date  . COLONOSCOPY  01/2014   diverticulosis, othwerise normal - Dr. Owens Loffler  . ESOPHAGOGASTRODUODENOSCOPY  2013   Dr. Benson Norway, gastritis  . ESOPHAGOGASTRODUODENOSCOPY  [id number]  . EUS N/A 04/16/2014   Procedure: UPPER ENDOSCOPIC ULTRASOUND (EUS) LINEAR;  Surgeon: Milus Banister, MD;  Location: [facility];  Service: Endoscopy;  Laterality: N/A;  . gall stone surgery    . KNEE ARTHROSCOPY Left   . LAPAROSCOPIC GASTRIC RESECTION N/A 06/09/2014   Procedure: LAPAROSCOPIC GASTRIC MASS RESECTION;  Surgeon: [name], MD;  Location: [facility];  Service: General;  Laterality: N/A;  . LIPOMA EXCISION     forehead  . LUMBAR LAMINECTOMY N/A 04/25/2019    Procedure: LEFT L2-3 MICRODISCECTOMY, BILATERAL L5-S1 PARTIAL HEMILAMINECTOMY;  Surgeon: Jessy Oto, MD;  Location: [facility];  Service: Orthopedics;  Laterality: N/A;  . UPPER GASTROINTESTINAL ENDOSCOPY    . UTERINE FIBROID SURGERY  Social History   Occupational History  . Occupation: IT sales professional: [employer]  Tobacco Use  . Smoking status: Never Smoker  . Smokeless tobacco: Never Used  Vaping Use  . Vaping Use: Never used  Substance and Sexual Activity  . Alcohol use: No    Alcohol/week: 0.0 standard drinks  . Drug use: No  . Sexual activity: Not on file

## 2020-03-25 ENCOUNTER — Ambulatory Visit: Payer: PRIVATE HEALTH INSURANCE | Admitting: Medical

## 2020-03-25 NOTE — Telephone Encounter (Signed)
Pt is coming in Monday.

## 2020-03-29 ENCOUNTER — Ambulatory Visit: Payer: PRIVATE HEALTH INSURANCE | Admitting: Medical

## 2020-04-13 ENCOUNTER — Other Ambulatory Visit: Payer: PRIVATE HEALTH INSURANCE

## 2020-04-13 ENCOUNTER — Other Ambulatory Visit: Payer: Self-pay

## 2020-04-13 DIAGNOSIS — Z20822 Contact with and (suspected) exposure to covid-19: Secondary | ICD-10-CM

## 2020-04-15 LAB — NOVEL CORONAVIRUS, NAA: SARS-CoV-2, NAA: NOT DETECTED

## 2020-04-15 LAB — SARS-COV-2, NAA 2 DAY TAT

## 2020-04-19 ENCOUNTER — Other Ambulatory Visit: Payer: Self-pay | Admitting: Physician Assistant

## 2020-04-19 ENCOUNTER — Other Ambulatory Visit: Payer: Self-pay | Admitting: Specialist

## 2020-04-19 ENCOUNTER — Other Ambulatory Visit: Payer: Self-pay | Admitting: Medical

## 2020-04-19 DIAGNOSIS — I429 Cardiomyopathy, unspecified: Secondary | ICD-10-CM

## 2020-04-19 DIAGNOSIS — M0579 Rheumatoid arthritis with rheumatoid factor of multiple sites without organ or systems involvement: Secondary | ICD-10-CM

## 2020-04-19 NOTE — Telephone Encounter (Signed)
Please advise 

## 2020-04-19 NOTE — Telephone Encounter (Signed)
Last Visit: 9/72021 Next Visit: 2/82022 Labs: 11/19/2019,Alkaline Phosphatase 134, MCHC 31.4,  Eye exam: 08/05/2019 WNL  Current Dose per office note 97/2021, plaquenil 200 mg 1 tablet by mouth twice daily [id number] arthritis involving multiple sites with positive rheumatoid factor   Okay to refill Plaquenil?

## 2020-04-27 NOTE — Progress Notes (Signed)
Office Visit Note  [name]             Date of Birth: [birth year]           MRN: [id number]             PCP: Carlena Hurl, PA-C Referring: Carlena Hurl, PA-C Visit Date: 05/11/2020 Occupation: @GUAROCC @  Subjective:  Medication management.   History of Present Illness: Lisa Briggs is a 60 y.o. female with history of rheumatoid arthritis and osteoarthritis. She states she has been having some discomfort in her left knee joint. She denies any joint swelling. She continues to have some lower back pain for which she has been seeing Dr. Louanne Skye. She has off-and-on discomfort in her bilateral trochanteric bursa.  Activities of Daily Living:  Patient reports morning stiffness for several  hours.   Patient Reports nocturnal pain.  Difficulty dressing/grooming: Reports Difficulty climbing stairs: Reports Difficulty getting out of chair: Reports Difficulty using hands for taps, buttons, cutlery, and/or writing: Reports  Review of Systems  Constitutional: Negative for fatigue, night sweats, weight gain and weight loss.  HENT: Positive for ear pain and mouth dryness. Negative for mouth sores, trouble swallowing, trouble swallowing and nose dryness.   Eyes: Positive for visual disturbance and dryness. Negative for pain, redness and itching.  Respiratory: Negative for cough, shortness of breath and difficulty breathing.   Cardiovascular: Negative for chest pain, palpitations, hypertension, irregular heartbeat and swelling in legs/feet.  Gastrointestinal: Negative for blood in stool, constipation and diarrhea.  Endocrine: Negative for increased urination.  Genitourinary: Negative for difficulty urinating and vaginal dryness.  Musculoskeletal: Positive for arthralgias, joint pain, myalgias, morning stiffness, muscle tenderness and myalgias. Negative for joint swelling and muscle weakness.  Skin: Negative for color change, rash, hair loss, redness, skin tightness, ulcers and  sensitivity to sunlight.  Allergic/Immunologic: Positive for susceptible to infections.  Neurological: Negative for dizziness, headaches, memory loss, night sweats and weakness.  Hematological: Positive for bruising/bleeding tendency. Negative for swollen glands.  Psychiatric/Behavioral: Negative for depressed mood, confusion and sleep disturbance. The patient is not nervous/anxious.     PMFS History:  Patient Active Problem List   Diagnosis Date Noted  . Prediabetes 02/19/2020  . Menopause 11/25/2019  . Cardiomyopathy ([place]) 11/19/2019  . BMI 38.0-38.9,adult 11/19/2019  . Rheumatoid arthritis involving multiple sites with positive rheumatoid factor ([place]) 11/19/2019  . Primary osteoarthritis involving multiple joints 11/19/2019  . Trochanteric bursitis 11/19/2019  . Decreased activities of daily living (ADL) 11/19/2019  . Gait disturbance 11/19/2019  . Chronic gastritis without bleeding 11/19/2019  . Use of cane as ambulatory aid 11/19/2019  . Pain in left shoulder 10/02/2019  . 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 Spinal stenosis of lumbar region 04/25/2019    Class: Chronic  . Status post lumbar laminectomy 04/25/2019  . Neck pain 11/12/2018  . Chronic pain of right knee 09/13/2018  . Bruising 09/13/2018  . SOB (shortness of breath) 08/30/2018  . Chronic pain of left knee 07/08/2018  . Chronic hip pain 07/08/2018  . Edema 07/08/2018  . Left foot pain 04/08/2018  . Olecranon bursitis of left elbow 04/05/2018  . Leg swelling 12/05/2017  . Need for shingles vaccine 12/05/2017  . Primary osteoarthritis of both hands 11/23/2017  . Rheumatoid factor positive 11/23/2017  . Primary osteoarthritis of both knees 11/23/2017  . Primary osteoarthritis of both feet 11/23/2017  . DDD (degenerative disc disease), lumbar 11/23/2017  . Vaccine counseling  08/13/2017  . Polyarthralgia 08/13/2017  . Joint stiffness 08/13/2017  . Sensitive skin 01/23/2017   . Need for influenza vaccination 01/23/2017  . Proteinuria 01/23/2017  . History of gastrointestinal stromal tumor (GIST) 04/20/2016  . History of TIA (transient ischemic attack) 04/20/2016  . Screening for breast cancer 04/20/2016  . Estrogen deficiency 04/20/2016  . Chronic maxillary sinusitis 04/20/2016  . Impaired fasting blood sugar 04/20/2016  . Constipation 04/20/2016  . History of fall 04/20/2016  . Chronic radicular lumbar pain 01/27/2016  . Encounter for health maintenance examination in adult 03/22/2015  . Paresthesia 03/22/2015  . Cognitive decline 03/22/2015  . Screening for cervical cancer 03/22/2015  . Vitamin D deficiency 03/22/2015  . History of uterine leiomyoma 03/22/2015  . Gastroesophageal reflux disease without esophagitis 03/02/2014  . Chronic nausea 03/02/2014  . Chronic abdominal pain 03/02/2014  . Rhinitis, allergic 03/02/2014  . Essential hypertension 03/02/2014  . Hyperlipidemia 03/02/2014  . Obesity with serious comorbidity 01/16/2012    Past Medical History:  Diagnosis Date  . Allergy   . Anemia    iron therapy for years as of 10/12; normal Hgb 08/2013  . Anxiety   . Arthritis   . Chest pain 04/05/2011   cardiac eval, normal treadmill stress test, Dr. Tollie Eth  . Chronic back pain   . Constipation   . Farsightedness    wears glasses, Eye care center  . 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 [name], [facility]  . GERD (gastroesophageal reflux disease)   . History of uterine fibroid   . Hyperlipidemia   . Hypertension   . Paresthesia 09/2014   initially thought to be TIA, neurology consult in 12/2014 with other non TIA considerations.    . Polyarthralgia    normal rheumatoid screen 01/2012  . TIA (transient ischemic attack) 2015/16    Family History  Problem Relation Age of Onset  . Hypertension Mother   . Arthritis Mother   . GER disease Mother   . Glaucoma Mother   . Breast cancer Mother    . Stroke Father   . Breast cancer Sister        breast cancer dx late 53s  . Hypertension Sister   . Colon cancer Brother 60  . Diabetes Brother   . Diabetes Maternal Aunt   . Heart disease Maternal Aunt   . Heart disease Maternal Grandmother   . Heart disease Maternal Grandfather   . Heart disease Maternal Uncle   . Stroke Maternal Uncle   . Leukemia Sister   . Hypertension Sister   . Healthy Son   . Colon polyps Neg Hx   . Esophageal cancer Neg Hx   . Stomach cancer Neg Hx   . Rectal cancer Neg Hx    Past Surgical History:  Procedure Laterality Date  . COLONOSCOPY  01/2014   diverticulosis, othwerise normal - Dr. Owens Loffler  . ESOPHAGOGASTRODUODENOSCOPY  2013   Dr. Benson Norway, gastritis  . ESOPHAGOGASTRODUODENOSCOPY  [id number]  . EUS N/A 04/16/2014   Procedure: UPPER ENDOSCOPIC ULTRASOUND (EUS) LINEAR;  Surgeon: Milus Banister, MD;  [facility];  Service: Endoscopy;  Laterality: N/A;  . gall stone surgery    . KNEE ARTHROSCOPY Left   . LAPAROSCOPIC GASTRIC RESECTION N/A 06/09/2014   Procedure: LAPAROSCOPIC GASTRIC MASS RESECTION;  Surgeon: [name], MD;  [facility]: [facility];  Service: General;  Laterality: N/A;  . LIPOMA EXCISION     forehead  . LUMBAR LAMINECTOMY N/A  04/25/2019   Procedure: LEFT L2-3 MICRODISCECTOMY, BILATERAL L5-S1 PARTIAL HEMILAMINECTOMY;  Surgeon: Jessy Oto, MD;  Location: [facility];  Service: Orthopedics;  Laterality: N/A;  . UPPER GASTROINTESTINAL ENDOSCOPY    . UTERINE FIBROID SURGERY     Social History   Social History Narrative   Married, has 1 son in [place] and some grandchildren.  Glass blower/designer.  Active on job.  Does stretching and exercises daily as per physical therapy.  Works 12 hours daily.     Immunization History  Administered Date(s) Administered  . Influenza,inj,Quad PF,6+ Mos 11/24/2013, 01/18/2015, 01/23/2017, 12/05/2017, 12/17/2018, 11/19/2019  . PFIZER(Purple Top)SARS-COV-2 Vaccination 06/16/2019, 07/08/2019,  01/15/2020  . Pneumococcal Polysaccharide-23 05/21/2015  . Tdap 01/17/2011  . Zoster Recombinat (Shingrix) 10/05/2017, 12/07/2017     Objective: Vital Signs: BP 134/85 (BP Location: Left Arm, Patient Position: Sitting, Cuff Size: Large)   Pulse 65   Resp 16   Ht 5\' 9"  (1.753 m)   Wt 266 lb 9.6 oz (120.9 kg)   LMP 02/23/2015 (LMP Unknown)   BMI 39.37 kg/m    Physical Exam Vitals and nursing note reviewed.  Constitutional:      Appearance: She is well-developed and well-nourished.  HENT:     Head: Normocephalic and atraumatic.  Eyes:     Extraocular Movements: EOM normal.     Conjunctiva/sclera: Conjunctivae normal.  Cardiovascular:     Rate and Rhythm: Normal rate and regular rhythm.     Pulses: Intact distal pulses.     Heart sounds: Normal heart sounds.  Pulmonary:     Effort: Pulmonary effort is normal.     Breath sounds: Normal breath sounds.  Abdominal:     General: Bowel sounds are normal.     Palpations: Abdomen is soft.  Musculoskeletal:     Cervical back: Normal range of motion.  Lymphadenopathy:     Cervical: No cervical adenopathy.  Skin:    General: Skin is warm and dry.     Capillary Refill: Capillary refill takes less than 2 seconds.  Neurological:     Mental Status: She is alert and oriented to person, place, and time.  Psychiatric:        Mood and Affect: Mood and affect normal.        Behavior: Behavior normal.      Musculoskeletal Exam: C-spine was in good range of motion.  She has some discomfort range of motion of her left shoulder joint.  Elbow joints and wrist joints with good range of motion.  There was all synovitis over MCPs or PIPs.  Mild PIP and DIP thickening consistent with osteoarthritis was noted.  She had crepitus in her bilateral knee joints without any warmth swelling or effusion.  There was no tenderness over ankles or MTPs.  CDAI Exam: CDAI Score: -- Patient Global: --; Provider Global: -- Swollen: --; Tender: -- Joint Exam  05/11/2020   No joint exam has been documented for this visit   There is currently no information documented on the homunculus. Go to the Rheumatology activity and complete the homunculus joint exam.  Investigation: No additional findings.  Imaging: XR Hand Complete Left  Result Date: 04/28/2020 AP and lateral and oblique radiographs of the lleftt hand demonstrate joint line narrowing left PIP and DIP joints that is symmetric and formation of an osteophyte left index PIP joint and good maintenance of the MCP joints. The basal thumb joint MCC joint is well maintained. Findings consistent with moderate DJD of the left hand PIP  and DIP joints History of RA.  XR Hand Complete Right  Result Date: 04/28/2020 AP and lateral and oblique radiographs of the right hand demonstrate joint line narrowing right PIP and DIP joints that is symmetric and formation of a dorsoulnar osteophyte right long PIP joint and good maintenance of the MCP joints. The basal thumb joint MCC joint is well maintained. Findings consistent with moderate DJD of the right hand PIP and DIP joints History of RA.    Recent Labs: Lab Results  Component Value Date   WBC 6.0 11/19/2019   HGB 12.6 11/19/2019   PLT 358 11/19/2019   NA 141 11/19/2019   K 4.5 11/19/2019   CL 103 11/19/2019   CO2 26 11/19/2019   GLUCOSE 90 11/19/2019   BUN 12 11/19/2019   CREATININE 0.66 11/19/2019   BILITOT 0.3 11/19/2019   ALKPHOS 134 (H) 11/19/2019   AST 15 11/19/2019   ALT 13 11/19/2019   PROT 7.0 11/19/2019   ALBUMIN 4.3 11/19/2019   CALCIUM 9.5 11/19/2019   GFRAA 113 11/19/2019    Speciality Comments: PLQ Eye Exam: 08/05/2019 WNL @ [facility] Follow up in 1 year  Procedures:  No procedures performed Allergies: Kiwi extract, Mucinex dm [dm-guaifenesin er], and Peach [prunus persica]   Assessment / Plan:     Visit Diagnoses: Rheumatoid arthritis involving multiple sites with positive rheumatoid factor (HCC) - +RF, +CCP:  -She  had no synovitis on examination.  She has been tolerating Plaquenil well.  Prescription refill for Plaquenil was given today.  We will also get her labs today.  Side effects of Plaquenil were reviewed.  Plan: hydroxychloroquine (PLAQUENIL) 200 MG tablet  High risk medication use - Plaquenil 200 mg 1 tablet by mouth twice daily. PLQ eye exam: 08/05/19 at [facility]: CBC with Differential/Platelet, COMPLETE METABOLIC PANEL WITH GFR today and then every 5 months.  She has been advised to schedule a follow-up visit with Dr. Katy Fitch.  Primary osteoarthritis of both hands-she continues to have some stiffness in her hands.  Joint protection was discussed.  Primary osteoarthritis of both knees-she has crepitus in her bilateral knee joints.  She gives history of chronic knee joint discomfort.  No warmth swelling or effusion was noted.  Primary osteoarthritis of both feet-she is currently not experiencing much pain in her knee joints.  Trochanteric bursitis of both hips-she has off-and-on discomfort and has been doing stretching exercises.  DDD (degenerative disc disease), lumbar - She had a left sided laminotomy and discectomy L2-L3 performed by Dr. Louanne Skye.  She complains of chronic pain.  Septic olecranon bursitis of left elbow - Resolved.  Closed avulsion fracture of left ankle with routine healing, subsequent encounter  Gastroesophageal reflux disease without esophagitis-I noted that she is on Lodine.  Have advised her against the use of Lodine due to history of reflux.  Essential hypertension-I take care thank you her blood pressure is well controlled.  History of TIA (transient ischemic attack)  History of hyperlipidemia-weight loss and dietary modifications were discussed.  Handout was placed in the AVS.  Vitamin D deficiency  BMI 39.0-39.9,adult-weight loss diet and exercise was discussed.  Educated about COVID-19 virus infection-she is fully vaccinated against COVID-19 and also had  her third dose.  Booster (fourth dose) 6 months after the third dose was advised.  Use of mask, social distancing and hand hygiene was discussed.  Orders: Orders Placed This Encounter  Procedures  . CBC with Differential/Platelet  . COMPLETE METABOLIC PANEL WITH GFR  Meds ordered this encounter  Medications  . hydroxychloroquine (PLAQUENIL) 200 MG tablet    Sig: TAKE 1 TABLET TWICE DAILY FOR RHEUMATOID ARTHRITIS    Dispense:  180 tablet    Refill:  0     Follow-Up Instructions: Return in about 5 months (around 10/08/2020) for Rheumatoid arthritis, Osteoarthritis.   Bo Merino, MD  Note - This record has been created using Editor, commissioning.  Chart creation errors have been sought, but may not always  have been located. Such creation errors do not reflect on  the standard of medical care.

## 2020-04-28 ENCOUNTER — Ambulatory Visit (INDEPENDENT_AMBULATORY_CARE_PROVIDER_SITE_OTHER): Payer: PRIVATE HEALTH INSURANCE

## 2020-04-28 ENCOUNTER — Ambulatory Visit (INDEPENDENT_AMBULATORY_CARE_PROVIDER_SITE_OTHER): Payer: PRIVATE HEALTH INSURANCE | Admitting: Specialist

## 2020-04-28 ENCOUNTER — Ambulatory Visit: Payer: Self-pay

## 2020-04-28 ENCOUNTER — Encounter: Payer: Self-pay | Admitting: Specialist

## 2020-04-28 VITALS — BP 135/85 | HR 75 | Ht 69.0 in | Wt 265.0 lb

## 2020-04-28 DIAGNOSIS — M05742 Rheumatoid arthritis with rheumatoid factor of left hand without organ or systems involvement: Secondary | ICD-10-CM | POA: Diagnosis not present

## 2020-04-28 DIAGNOSIS — M5126 Other intervertebral disc displacement, lumbar region: Secondary | ICD-10-CM | POA: Diagnosis not present

## 2020-04-28 DIAGNOSIS — M79641 Pain in right hand: Secondary | ICD-10-CM

## 2020-04-28 DIAGNOSIS — M05741 Rheumatoid arthritis with rheumatoid factor of right hand without organ or systems involvement: Secondary | ICD-10-CM | POA: Diagnosis not present

## 2020-04-28 DIAGNOSIS — M79642 Pain in left hand: Secondary | ICD-10-CM | POA: Diagnosis not present

## 2020-04-28 DIAGNOSIS — Z9889 Other specified postprocedural states: Secondary | ICD-10-CM

## 2020-04-28 DIAGNOSIS — G8929 Other chronic pain: Secondary | ICD-10-CM

## 2020-04-28 DIAGNOSIS — M25511 Pain in right shoulder: Secondary | ICD-10-CM

## 2020-04-28 DIAGNOSIS — M25512 Pain in left shoulder: Secondary | ICD-10-CM

## 2020-04-28 DIAGNOSIS — M5136 Other intervertebral disc degeneration, lumbar region: Secondary | ICD-10-CM

## 2020-04-28 DIAGNOSIS — M2242 Chondromalacia patellae, left knee: Secondary | ICD-10-CM

## 2020-04-28 NOTE — Progress Notes (Signed)
Office Visit Note   [name]           Date of Birth: [birth year]           MRN: [id number] Visit Date: 04/28/2020              Requested by: Carlena Hurl, PA-C [address] [place],  [place] [id number] PCP: Carlena Hurl, PA-C   Assessment & Plan: Visit Diagnoses:  1. Pain in both hands     Plan:Avoid frequent bending and stooping  No lifting greater than 10 lbs. May use ice or moist heat for pain. Weight loss is of benefit. Best medication for lumbar disc disease is arthritis medications like motrin, celebrex and naprosyn. 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 Well padded shoes help. Ice the kneethatis suffering from osteoarthritis, only real proven treatments are Weight loss, NSIADs like diclofenac and exercise. Well padded shoes help. Ice the knee 2-3 times a day 15-20 mins at a time 3 times a day 15-20 mins at a time. Hot showers in the AM.  Injection with steroid may be of benefit. Hemp CBD capsules, amazon.com 5,000-7,000 mg per bottle, 60 capsules per bottle, take one capsule twice a day. Cane in theright hand to use with left leg weight bearing. See occupational therapy for hand exercises and parafin  [place].  Follow-Up Instructions: No follow-ups on file.   Orders:  Orders Placed This Encounter  Procedures  . XR Hand Complete Right  . XR Hand Complete Left   No orders of the defined types were placed in this encounter.     Procedures: No procedures performed   Clinical Data: No additional findings.   Subjective: Chief Complaint  Patient presents with  . Lower Back - Follow-up  . Left Knee - Follow-up    60 year old right handed female with history of bilateral  hand pain with increasing pain and swelling in the knuckles of the fingers. 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 She states the back pain comes and goes and is not bad right now.    Review of Systems  Constitutional: Negative.   HENT: Negative.   Eyes: Negative.   Respiratory: Negative.   Cardiovascular: Negative.   Gastrointestinal: Negative.   Endocrine: Negative.   Genitourinary: Negative.   Musculoskeletal: Negative.   Skin: Negative.   Allergic/Immunologic: Negative.   Neurological: Negative.   Hematological: Negative.   Psychiatric/Behavioral: Negative.      Objective: Vital Signs: BP 135/85 (BP Location: Left Arm, Patient Position: Sitting)   Pulse 75   Ht 5\' 9"  (1.753 m)   Wt 265 lb (120.2 kg)   LMP 02/23/2015 (LMP Unknown)   BMI 39.13 kg/m   Physical Exam Constitutional:      Appearance: She is well-developed and well-nourished.  HENT:     Head: Normocephalic and atraumatic.  Eyes:     Extraocular Movements: EOM normal.     Pupils: Pupils are equal, round, and reactive to light.  Pulmonary:     Effort: Pulmonary effort is normal.     Breath sounds: Normal breath sounds.  Abdominal:  General: Bowel sounds are normal.     Palpations: Abdomen is soft.  Musculoskeletal:        General: Normal range of motion.     Cervical back: Normal range of motion and neck supple.  Skin:    General: Skin is warm and dry.  Neurological:     Mental Status: She is alert and oriented to person, place, and time.  Psychiatric:        Mood and Affect: Mood and affect normal.        Behavior: Behavior normal.        Thought Content: Thought content normal.        Judgment: Judgment normal.     Ortho Exam  Specialty Comments:  No specialty comments available.  Imaging: No results found.   PMFS History: Patient Active  Problem List   Diagnosis Date Noted  . 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    Priority: High    Class: Chronic  . Spinal stenosis of lumbar region 04/25/2019    Priority: High    Class: Chronic  . Prediabetes 02/19/2020  . Menopause 11/25/2019  . Cardiomyopathy ([place]) 11/19/2019  . BMI 38.0-38.9,adult 11/19/2019  . Rheumatoid arthritis involving multiple sites with positive rheumatoid factor ([place]) 11/19/2019  . Primary osteoarthritis involving multiple joints 11/19/2019  . Trochanteric bursitis 11/19/2019  . Decreased activities of daily living (ADL) 11/19/2019  . Gait disturbance 11/19/2019  . Chronic gastritis without bleeding 11/19/2019  . Use of cane as ambulatory aid 11/19/2019  . Pain in left shoulder 10/02/2019  . Status post lumbar laminectomy 04/25/2019  . Neck pain 11/12/2018  . Chronic pain of right knee 09/13/2018  . Bruising 09/13/2018  . SOB (shortness of breath) 08/30/2018  . Chronic pain of left knee 07/08/2018  . Chronic hip pain 07/08/2018  . Edema 07/08/2018  . Left foot pain 04/08/2018  . Olecranon bursitis of left elbow 04/05/2018  . Leg swelling 12/05/2017  . Need for shingles vaccine 12/05/2017  . Primary osteoarthritis of both hands 11/23/2017  . Rheumatoid factor positive 11/23/2017  . Primary osteoarthritis of both knees 11/23/2017  . Primary osteoarthritis of both feet 11/23/2017  . DDD (degenerative disc disease), lumbar 11/23/2017  . Vaccine counseling 08/13/2017  . Polyarthralgia 08/13/2017  . Joint stiffness 08/13/2017  . Sensitive skin 01/23/2017  . Need for influenza vaccination 01/23/2017  . Proteinuria 01/23/2017  . History of gastrointestinal stromal tumor (GIST) 04/20/2016  . History of TIA (transient ischemic attack) 04/20/2016  . Screening for breast cancer 04/20/2016  . Estrogen deficiency 04/20/2016  . Chronic maxillary sinusitis 04/20/2016  . Impaired fasting blood sugar 04/20/2016  . Constipation 04/20/2016   . History of fall 04/20/2016  . Chronic radicular lumbar pain 01/27/2016  . Encounter for health maintenance examination in adult 03/22/2015  . Paresthesia 03/22/2015  . Cognitive decline 03/22/2015  . Screening for cervical cancer 03/22/2015  . Vitamin D deficiency 03/22/2015  . History of uterine leiomyoma 03/22/2015  . Gastroesophageal reflux disease without esophagitis 03/02/2014  . Chronic nausea 03/02/2014  . Chronic abdominal pain 03/02/2014  . Rhinitis, allergic 03/02/2014  . Essential hypertension 03/02/2014  . Hyperlipidemia 03/02/2014  . Obesity with serious comorbidity 01/16/2012   Past Medical History:  Diagnosis Date  . Allergy   . Anemia    iron therapy for years as of 10/12; normal Hgb 08/2013  . Anxiety   . Arthritis   . Chest pain 04/05/2011   cardiac eval, normal treadmill stress test, Dr.  Tollie Eth  . Chronic back pain   . Constipation   . Farsightedness    wears glasses, Eye care center  . 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 [name], [facility]  . GERD (gastroesophageal reflux disease)   . History of uterine fibroid   . Hyperlipidemia   . Hypertension   . Paresthesia 09/2014   initially thought to be TIA, neurology consult in 12/2014 with other non TIA considerations.    . Polyarthralgia    normal rheumatoid screen 01/2012  . TIA (transient ischemic attack) 2015/16    Family History  Problem Relation Age of Onset  . Hypertension Mother   . Arthritis Mother   . GER disease Mother   . Glaucoma Mother   . Breast cancer Mother   . Stroke Father   . Breast cancer Sister        breast cancer dx late 85s  . Hypertension Sister   . Colon cancer Brother 26  . Diabetes Brother   . Diabetes Maternal Aunt   . Heart disease Maternal Aunt   . Heart disease Maternal Grandmother   . Heart disease Maternal Grandfather   . Heart disease Maternal Uncle   . Stroke Maternal Uncle   . Leukemia Sister   .  Hypertension Sister   . Healthy Son   . Colon polyps Neg Hx   . Esophageal cancer Neg Hx   . Stomach cancer Neg Hx   . Rectal cancer Neg Hx     Past Surgical History:  Procedure Laterality Date  . COLONOSCOPY  01/2014   diverticulosis, othwerise normal - Dr. Owens Loffler  . ESOPHAGOGASTRODUODENOSCOPY  2013   Dr. Benson Norway, gastritis  . ESOPHAGOGASTRODUODENOSCOPY  [id number]  . EUS N/A 04/16/2014   Procedure: UPPER ENDOSCOPIC ULTRASOUND (EUS) LINEAR;  Surgeon: Milus Banister, MD;  [facility];  Service: Endoscopy;  Laterality: N/A;  . gall stone surgery    . KNEE ARTHROSCOPY Left   . LAPAROSCOPIC GASTRIC RESECTION N/A 06/09/2014   Procedure: LAPAROSCOPIC GASTRIC MASS RESECTION;  Surgeon: [name], MD;  [facility];  Service: General;  Laterality: N/A;  . LIPOMA EXCISION     forehead  . LUMBAR LAMINECTOMY N/A 04/25/2019   Procedure: LEFT L2-3 MICRODISCECTOMY, BILATERAL L5-S1 PARTIAL HEMILAMINECTOMY;  Surgeon: Jessy Oto, MD;  Location: [facility];  Service: Orthopedics;  Laterality: N/A;  . UPPER GASTROINTESTINAL ENDOSCOPY    . UTERINE FIBROID SURGERY     Social History   Occupational History  . Occupation: IT sales professional: [employer]  Tobacco Use  . Smoking status: Never Smoker  . Smokeless tobacco: Never Used  Vaping Use  . Vaping Use: Never used  Substance and Sexual Activity  . Alcohol use: No    Alcohol/week: 0.0 standard drinks  . Drug use: No  . Sexual activity: Not on file

## 2020-04-28 NOTE — Patient Instructions (Addendum)
Plan:Avoid frequent bending and stooping  No lifting greater than 10 lbs. May use ice or moist heat for pain. Weight loss is of benefit. Best medication for lumbar disc disease is arthritis medications like motrin, celebrex and naprosyn. 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 Well padded shoes help. Ice the kneethatis suffering from osteoarthritis, only real proven treatments are Weight loss, NSIADs like diclofenac and exercise. Well padded shoes help. Ice the knee 2-3 times a day 15-20 mins at a time 3 times a day 15-20 mins at a time. Hot showers in the AM.  Injection with steroid may be of benefit. Hemp CBD capsules, [email or web address] 5,000-7,000 mg per bottle, 60 capsules per bottle, take one capsule twice a day. Cane in theright hand to use with left leg weight bearing. See occupational therapy for hand exercises and parafin  [place].  Follow-Up Instructions: No follow-ups on file.

## 2020-05-04 ENCOUNTER — Telehealth: Payer: Self-pay | Admitting: Specialist

## 2020-05-04 ENCOUNTER — Telehealth: Payer: Self-pay | Admitting: Medical

## 2020-05-04 NOTE — Telephone Encounter (Signed)
Please handle this.  I feel like we have changed pharmacies with all of her medicines 2 or 3 times in the recent 6 months.  This requires a lot of effort to refill it every single medicine.

## 2020-05-04 NOTE — Telephone Encounter (Signed)
Lmom for patient to call back in reference to pharmacy change

## 2020-05-04 NOTE — Telephone Encounter (Signed)
Pt called and needs all medications transferred to [facility] she is requesting 90 day supply if possible

## 2020-05-04 NOTE — Telephone Encounter (Signed)
Patient called requesting all prescriptions be sent to [facility] [place] states they do 90 day supply and asking for her medications be sent there today so she can start getting her medications. Patient phone number is [phone].

## 2020-05-04 NOTE — Telephone Encounter (Signed)
Please advise 

## 2020-05-05 ENCOUNTER — Other Ambulatory Visit: Payer: Self-pay

## 2020-05-05 ENCOUNTER — Other Ambulatory Visit: Payer: Self-pay | Admitting: Specialist

## 2020-05-05 DIAGNOSIS — K2950 Unspecified chronic gastritis without bleeding: Secondary | ICD-10-CM

## 2020-05-05 MED ORDER — FEXOFENADINE HCL 180 MG PO TABS
180.0000 mg | ORAL_TABLET | Freq: Every day | ORAL | 3 refills | Status: DC
Start: 2020-05-05 — End: 2020-07-20

## 2020-05-05 MED ORDER — SIMVASTATIN 20 MG PO TABS
20.0000 mg | ORAL_TABLET | Freq: Every day | ORAL | 3 refills | Status: DC
Start: 2020-05-05 — End: 2020-07-20

## 2020-05-05 MED ORDER — GABAPENTIN 300 MG PO CAPS
300.0000 mg | ORAL_CAPSULE | Freq: Three times a day (TID) | ORAL | 3 refills | Status: DC
Start: 2020-05-05 — End: 2020-07-20

## 2020-05-05 MED ORDER — VITAMIN D 25 MCG (1000 UNIT) PO TABS
1000.0000 [IU] | ORAL_TABLET | Freq: Every day | ORAL | 3 refills | Status: DC
Start: 2020-05-05 — End: 2020-07-20

## 2020-05-05 MED ORDER — FLUTICASONE PROPIONATE 50 MCG/ACT NA SUSP
1.0000 | Freq: Every day | NASAL | 11 refills | Status: DC
Start: 2020-05-05 — End: 2020-07-20

## 2020-05-05 MED ORDER — LOSARTAN POTASSIUM 50 MG PO TABS
50.0000 mg | ORAL_TABLET | Freq: Every day | ORAL | 0 refills | Status: DC
Start: 2020-05-05 — End: 2020-05-26

## 2020-05-05 MED ORDER — POTASSIUM CHLORIDE CRYS ER 10 MEQ PO TBCR
10.0000 meq | EXTENDED_RELEASE_TABLET | Freq: Two times a day (BID) | ORAL | 3 refills | Status: DC
Start: 2020-05-05 — End: 2020-07-20

## 2020-05-05 MED ORDER — ASPIRIN EC 81 MG PO TBEC
81.0000 mg | DELAYED_RELEASE_TABLET | Freq: Every day | ORAL | 3 refills | Status: DC
Start: 2020-05-05 — End: 2020-07-20

## 2020-05-05 MED ORDER — ETODOLAC 400 MG PO TABS: ORAL_TABLET | ORAL | 3 refills | Status: DC

## 2020-05-05 MED ORDER — DEXLANSOPRAZOLE 60 MG PO CPDR: 60.0000 mg | DELAYED_RELEASE_CAPSULE | Freq: Every day | ORAL | 3 refills | Status: DC

## 2020-05-05 MED ORDER — LINACLOTIDE 145 MCG PO CAPS
145.0000 ug | ORAL_CAPSULE | Freq: Every day | ORAL | 1 refills | Status: DC
Start: 2020-05-05 — End: 2020-07-20

## 2020-05-05 MED ORDER — FAMOTIDINE 20 MG PO TABS
ORAL_TABLET | ORAL | 3 refills | Status: DC
Start: 2020-05-05 — End: 2020-07-20

## 2020-05-05 MED ORDER — BD PEN NEEDLE NANO U/F 32G X 4 MM MISC: 1.0000 | Freq: Every day | 11 refills | Status: DC

## 2020-05-05 NOTE — Telephone Encounter (Signed)
Pt. Called back and I confirmed that she does indeed want her medicines sent to [facility]. She said [facility] was over charging her for her medicines, she was paying more for 1 month supply than she did for a 90 day supply through [facility].

## 2020-05-05 NOTE — Telephone Encounter (Signed)
Patient returned call asked for a call back concerning her Rx being [facility] to [facility]. The number to contact patient is [phone]

## 2020-05-05 NOTE — Telephone Encounter (Signed)
Meds sent

## 2020-05-05 NOTE — Telephone Encounter (Signed)
Done

## 2020-05-07 ENCOUNTER — Telehealth: Payer: Self-pay

## 2020-05-07 NOTE — Telephone Encounter (Signed)
Appeal letter typed  

## 2020-05-07 NOTE — Telephone Encounter (Signed)
P.A. DEXILANT completed on [email or web address] [email or web address] Pt was approved for generic but needs brand.  Called & they were unable to switch over the phone so I resubmitted for brand only

## 2020-05-07 NOTE — Telephone Encounter (Signed)
P.A. Mancel Parsons was denied,  States only approved with adjunct to lifestyle modifications which was listed in her chart notes and sent with P.A.  I called RX benefits t# [phone] & case was closed must do appeal

## 2020-05-10 NOTE — Telephone Encounter (Signed)
Called to check status of P.A. and they had denied thinking was duplicate explained was for [facility] and they resubmitted

## 2020-05-10 NOTE — Telephone Encounter (Signed)
Appeal faxed in

## 2020-05-11 ENCOUNTER — Other Ambulatory Visit: Payer: Self-pay

## 2020-05-11 ENCOUNTER — Ambulatory Visit (INDEPENDENT_AMBULATORY_CARE_PROVIDER_SITE_OTHER): Payer: PRIVATE HEALTH INSURANCE | Admitting: Rheumatology

## 2020-05-11 ENCOUNTER — Encounter: Payer: Self-pay | Admitting: Rheumatology

## 2020-05-11 VITALS — BP 134/85 | HR 65 | Resp 16 | Ht 69.0 in | Wt 266.6 lb

## 2020-05-11 DIAGNOSIS — M19041 Primary osteoarthritis, right hand: Secondary | ICD-10-CM | POA: Diagnosis not present

## 2020-05-11 DIAGNOSIS — M71122 Other infective bursitis, left elbow: Secondary | ICD-10-CM

## 2020-05-11 DIAGNOSIS — S82892D Other fracture of left lower leg, subsequent encounter for closed fracture with routine healing: Secondary | ICD-10-CM

## 2020-05-11 DIAGNOSIS — M7061 Trochanteric bursitis, right hip: Secondary | ICD-10-CM

## 2020-05-11 DIAGNOSIS — M19042 Primary osteoarthritis, left hand: Secondary | ICD-10-CM

## 2020-05-11 DIAGNOSIS — M7062 Trochanteric bursitis, left hip: Secondary | ICD-10-CM

## 2020-05-11 DIAGNOSIS — M5136 Other intervertebral disc degeneration, lumbar region: Secondary | ICD-10-CM

## 2020-05-11 DIAGNOSIS — Z79899 Other long term (current) drug therapy: Secondary | ICD-10-CM | POA: Diagnosis not present

## 2020-05-11 DIAGNOSIS — E559 Vitamin D deficiency, unspecified: Secondary | ICD-10-CM

## 2020-05-11 DIAGNOSIS — M0579 Rheumatoid arthritis with rheumatoid factor of multiple sites without organ or systems involvement: Secondary | ICD-10-CM

## 2020-05-11 DIAGNOSIS — Z6838 Body mass index (BMI) 38.0-38.9, adult: Secondary | ICD-10-CM

## 2020-05-11 DIAGNOSIS — K219 Gastro-esophageal reflux disease without esophagitis: Secondary | ICD-10-CM

## 2020-05-11 DIAGNOSIS — M170 Bilateral primary osteoarthritis of knee: Secondary | ICD-10-CM

## 2020-05-11 DIAGNOSIS — M51369 Other intervertebral disc degeneration, lumbar region without mention of lumbar back pain or lower extremity pain: Secondary | ICD-10-CM

## 2020-05-11 DIAGNOSIS — Z6839 Body mass index (BMI) 39.0-39.9, adult: Secondary | ICD-10-CM

## 2020-05-11 DIAGNOSIS — Z8673 Personal history of transient ischemic attack (TIA), and cerebral infarction without residual deficits: Secondary | ICD-10-CM

## 2020-05-11 DIAGNOSIS — I10 Essential (primary) hypertension: Secondary | ICD-10-CM

## 2020-05-11 DIAGNOSIS — Z8639 Personal history of other endocrine, nutritional and metabolic disease: Secondary | ICD-10-CM

## 2020-05-11 DIAGNOSIS — M19071 Primary osteoarthritis, right ankle and foot: Secondary | ICD-10-CM

## 2020-05-11 DIAGNOSIS — Z7189 Other specified counseling: Secondary | ICD-10-CM

## 2020-05-11 DIAGNOSIS — M19072 Primary osteoarthritis, left ankle and foot: Secondary | ICD-10-CM

## 2020-05-11 MED ORDER — HYDROXYCHLOROQUINE SULFATE 200 MG PO TABS: ORAL_TABLET | ORAL | 0 refills | Status: DC

## 2020-05-11 NOTE — Patient Instructions (Addendum)
Standing Labs We placed an order today for your standing lab work.   Please have your standing labs drawn in July  If possible, please have your labs drawn 2 weeks prior to your appointment so that the provider can discuss your results at your appointment.  We have open lab daily Monday through Thursday from 1:30-4:30 PM and Friday from 1:30-4:00 PM at the office of Dr. Bo Merino, [facility].   Please be advised, all patients with office appointments requiring lab work will take precedents over walk-in lab work.  If possible, please come for your lab work on Monday and Friday afternoons, as you may experience shorter wait times. The office is located at [address], [place], [place], [place] [address] No appointment is necessary.   Labs are drawn by [facility]. Please bring your co-pay at the time of your lab draw.  You may receive a bill from [facility] for your lab work.  If you wish to have your labs drawn at another location, please call the office 24 hours in advance to send orders.  If you have any questions regarding directions or hours of operation,  please call [phone].   As a reminder, please drink plenty of water prior to coming for your lab work. Thanks!  COVID-19 vaccine recommendations:   COVID-19 vaccine is recommended for everyone (unless you are allergic to a vaccine component), even if you are on a medication that suppresses your immune system.   Immunocompromise individuals advised to get 3 COVID-19 vaccines 1 month apart and then a fourth dose (booster 6 months after the third dose.  Do not take Tylenol or any anti-inflammatory medications (NSAIDs) 24 hours prior to the COVID-19 vaccination.   There is no direct evidence about the efficacy of the COVID-19 vaccine in individuals who are on medications that suppress the immune system.   Even if you are fully vaccinated, and you are on any medications that suppress your immune system, please  continue to wear a mask, maintain at least six feet social distance and practice hand hygiene.   If you develop a COVID-19 infection, please contact your PCP or our office to determine if you need monoclonal antibody infusion.  The booster vaccine is now available for immunocompromised patients.   Please see the following web sites for updated information.   [email or web address]   Heart Disease Prevention   Your inflammatory disease increases your risk of heart disease which includes heart attack, stroke, atrial fibrillation (irregular heartbeats), high blood pressure, heart failure and atherosclerosis (plaque in the arteries).  It is important to reduce your risk by:   . Keep blood pressure, cholesterol, and blood sugar at healthy levels   . Smoking Cessation   . Maintain a healthy weight  o BMI 20-25   . Eat a healthy diet  o Plenty of fresh fruit, vegetables, and whole grains  o Limit saturated fats, foods high in sodium, and added sugars  o DASH and Mediterranean diet   . Increase physical activity  o Recommend moderate physically activity for 150 minutes per week/ 30 minutes a day for five days a week These can be broken up into three separate ten-minute sessions during the day.   . Reduce Stress  . Meditation, slow breathing exercises, yoga, coloring books  . Dental visits twice a year

## 2020-05-12 LAB — COMPLETE METABOLIC PANEL WITH GFR
AG Ratio: 1.5 (calc) (ref 1.0–2.5)
ALT: 12 U/L (ref 6–29)
AST: 13 U/L (ref 10–35)
Albumin: 4.1 g/dL (ref 3.6–5.1)
Alkaline phosphatase (APISO): 129 U/L (ref 37–153)
BUN: 16 mg/dL (ref 7–25)
CO2: 31 mmol/L (ref 20–32)
Calcium: 9.6 mg/dL (ref 8.6–10.4)
Chloride: 106 mmol/L (ref 98–110)
Creat: 0.63 mg/dL (ref 0.50–1.05)
GFR, Est African American: 115 mL/min/{1.73_m2} (ref >=60)
GFR, Est Non African American: 100 mL/min/{1.73_m2} (ref >=60)
Globulin: 2.7 g/dL (calc) (ref 1.9–3.7)
Glucose, Bld: 86 mg/dL (ref 65–99)
Potassium: 5.1 mmol/L (ref 3.5–5.3)
Sodium: 143 mmol/L (ref 135–146)
Total Bilirubin: 0.3 mg/dL (ref 0.2–1.2)
Total Protein: 6.8 g/dL (ref 6.1–8.1)

## 2020-05-12 LAB — CBC WITH DIFFERENTIAL/PLATELET
Absolute Monocytes: 508 cells/uL (ref 200–950)
Basophils Absolute: 19 cells/uL (ref 0–200)
Basophils Relative: 0.3 %
Eosinophils Absolute: 112 cells/uL (ref 15–500)
Eosinophils Relative: 1.8 %
HCT: 40.8 % (ref 35.0–45.0)
Hemoglobin: 12.9 g/dL (ref 11.7–15.5)
Lymphs Abs: 2350 cells/uL (ref 850–3900)
MCH: 26.7 pg — ABNORMAL LOW (ref 27.0–33.0)
MCHC: 31.6 g/dL — ABNORMAL LOW (ref 32.0–36.0)
MCV: 84.5 fL (ref 80.0–100.0)
MPV: 10.2 fL (ref 7.5–12.5)
Monocytes Relative: 8.2 %
Neutro Abs: 3212 cells/uL (ref 1500–7800)
Neutrophils Relative %: 51.8 %
Platelets: 361 10*3/uL (ref 140–400)
RBC: 4.83 10*6/uL (ref 3.80–5.10)
RDW: 12.7 % (ref 11.0–15.0)
Total Lymphocyte: 37.9 %
WBC: 6.2 10*3/uL (ref 3.8–10.8)

## 2020-05-12 NOTE — Progress Notes (Signed)
CBC and CMP are stable.

## 2020-05-15 ENCOUNTER — Telehealth: Payer: Self-pay

## 2020-05-15 NOTE — Telephone Encounter (Signed)
P.A. WEGOVY appeal was approved til 05/10/2021

## 2020-05-16 ENCOUNTER — Other Ambulatory Visit: Payer: Self-pay | Admitting: Specialist

## 2020-05-16 ENCOUNTER — Other Ambulatory Visit: Payer: Self-pay | Admitting: Medical

## 2020-05-16 DIAGNOSIS — I429 Cardiomyopathy, unspecified: Secondary | ICD-10-CM

## 2020-05-18 ENCOUNTER — Ambulatory Visit: Payer: PRIVATE HEALTH INSURANCE | Attending: Occupational Therapy | Admitting: Occupational Therapy

## 2020-05-26 ENCOUNTER — Telehealth: Payer: Self-pay

## 2020-05-26 ENCOUNTER — Other Ambulatory Visit: Payer: Self-pay

## 2020-05-26 MED ORDER — LOSARTAN POTASSIUM 50 MG PO TABS
50.0000 mg | ORAL_TABLET | Freq: Every day | ORAL | 0 refills | Status: DC
Start: 2020-05-26 — End: 2020-07-12

## 2020-05-26 NOTE — Telephone Encounter (Signed)
Received fax from [facility] for a refill on the pts. Losartan pt. Last apt was 02/19/20 and next apt 06/02/20.

## 2020-05-26 NOTE — Telephone Encounter (Signed)
Medication has been sent electronically.

## 2020-06-02 ENCOUNTER — Encounter: Payer: PRIVATE HEALTH INSURANCE | Admitting: Medical

## 2020-06-03 IMAGING — DX DG ANKLE COMPLETE 3+V*L*
3 series · 3 of 3 positions shown · non-contrast
Comparison: None.

CLINICAL DATA: Lateral left ankle pain following a fall.

EXAM:
LEFT ANKLE COMPLETE - 3+ VIEW

[ankle ap]
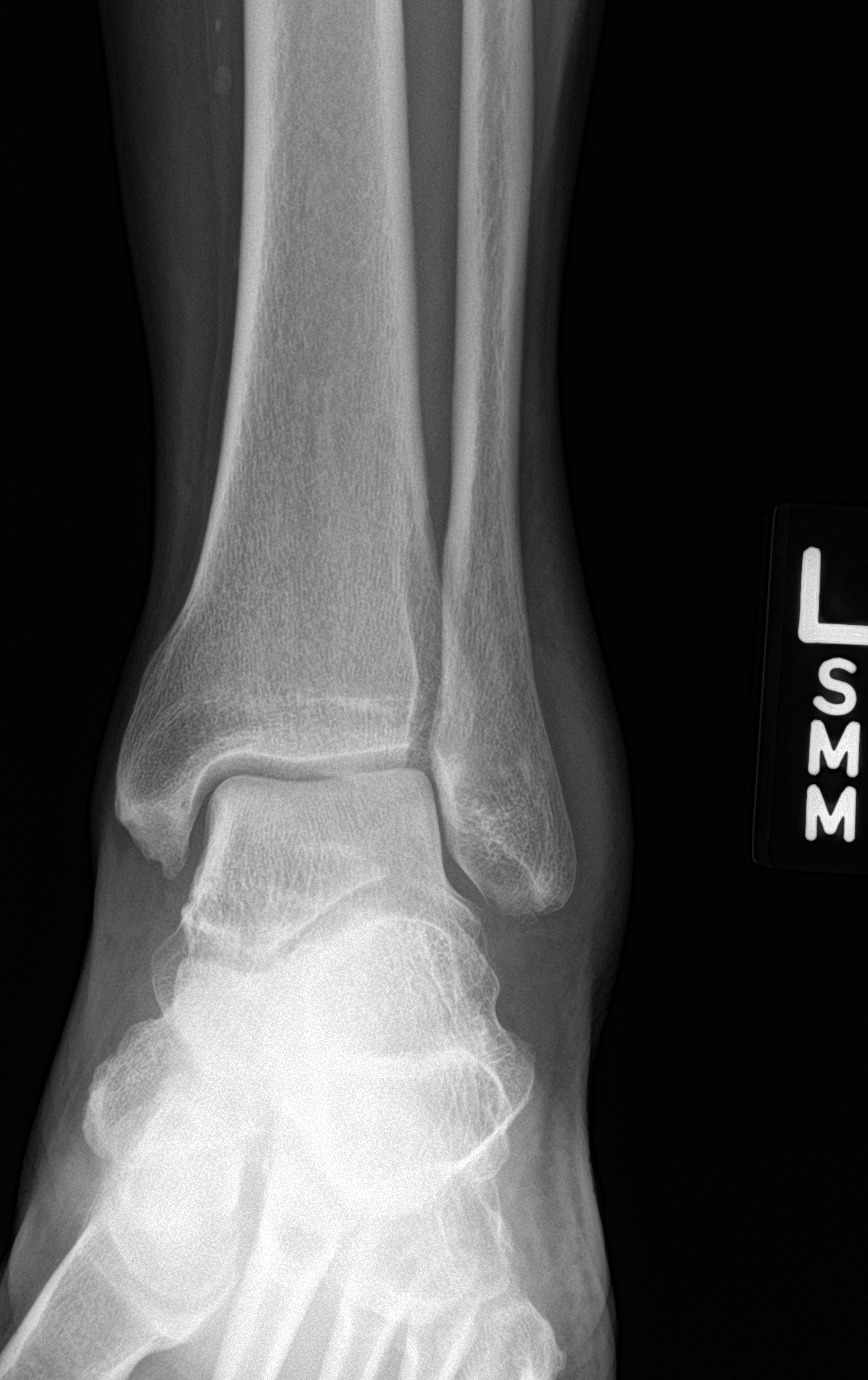

[ankle obl]
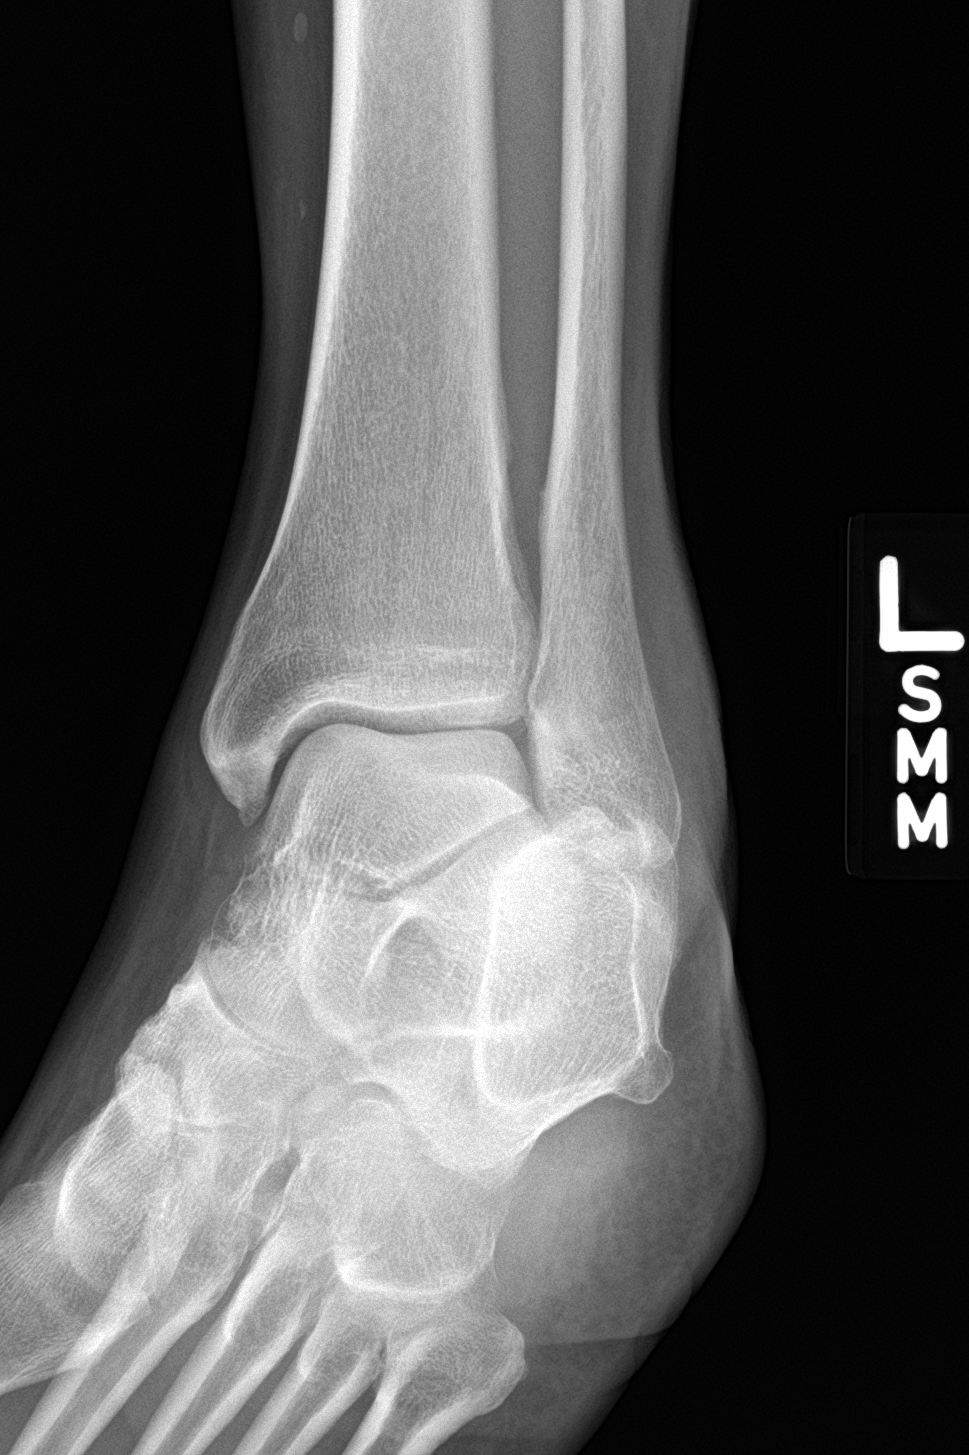

[ankle lat]
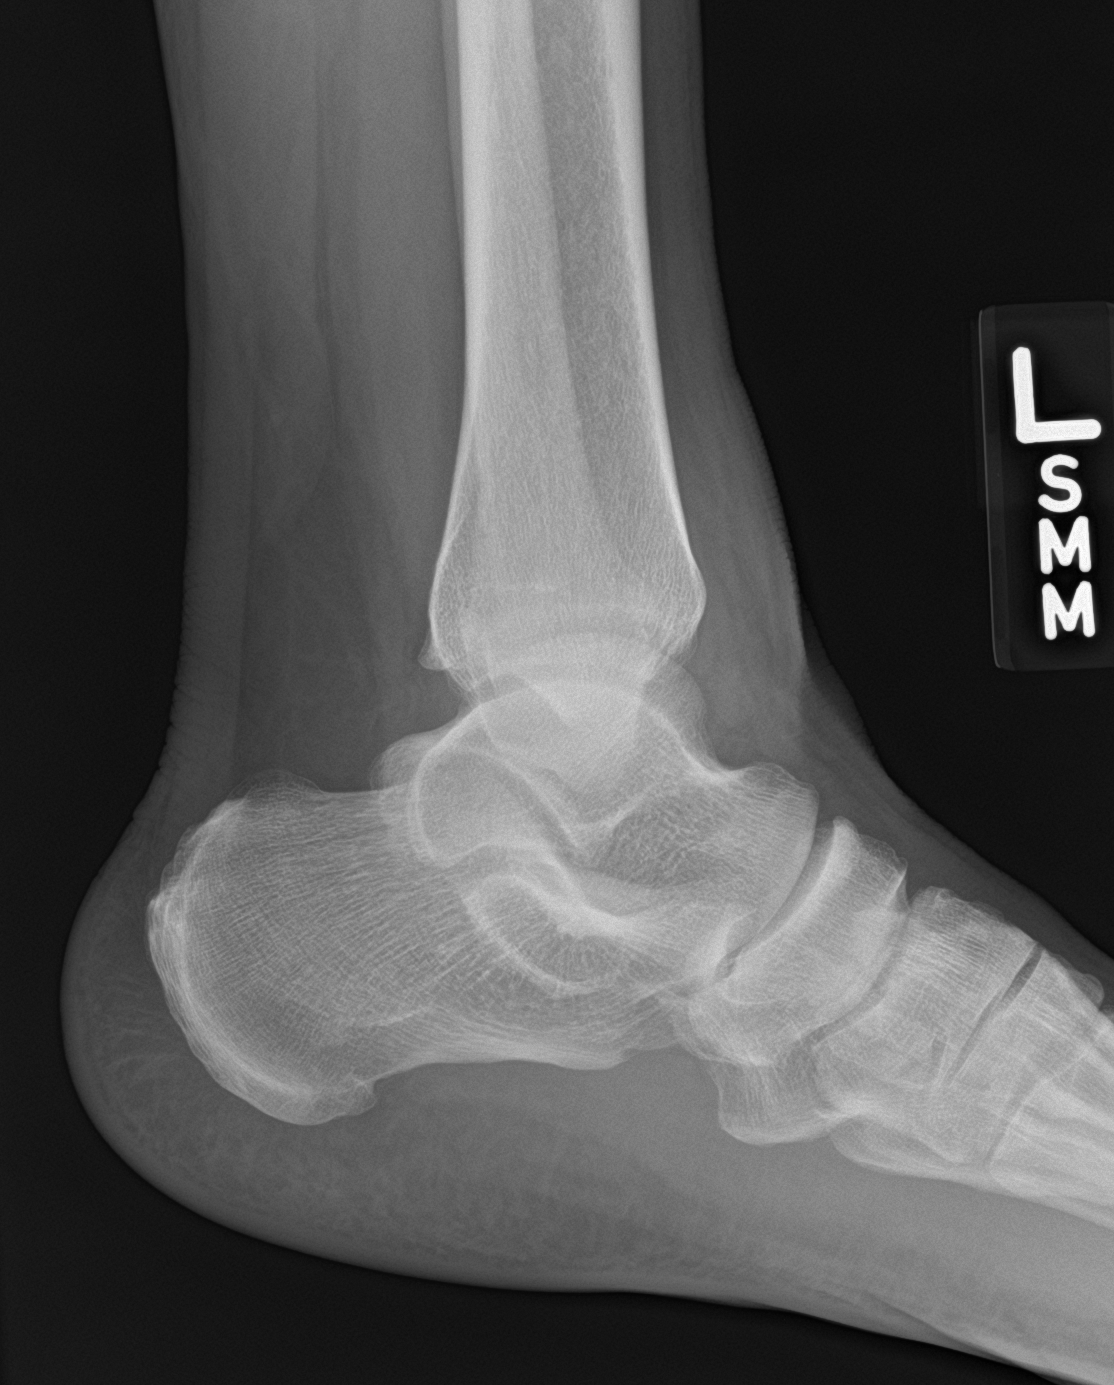

[3 of 3 positions shown; findings below may reference images not displayed]

FINDINGS: Diffuse lateral soft tissue swelling. Possible small avulsion
fracture off the distal aspect of the lateral malleolus on the
frontal view. Otherwise, no fractures are seen. No effusion. Mild
inferior calcaneal spur formation. Mild distal medial malleolus spur
formation.
IMPRESSION: 1. Possible small avulsion fracture off the distal aspect of the
lateral malleolus.
2. Mild degenerative changes.

## 2020-06-07 ENCOUNTER — Encounter: Payer: Self-pay | Admitting: Medical

## 2020-06-07 ENCOUNTER — Other Ambulatory Visit: Payer: Self-pay | Admitting: Medical

## 2020-06-07 DIAGNOSIS — Z1231 Encounter for screening mammogram for malignant neoplasm of breast: Secondary | ICD-10-CM

## 2020-06-09 ENCOUNTER — Ambulatory Visit: Payer: PRIVATE HEALTH INSURANCE | Admitting: Specialist

## 2020-06-15 ENCOUNTER — Telehealth: Payer: Self-pay

## 2020-06-15 NOTE — Telephone Encounter (Signed)
Called [facility] to see cost of medication and they do not have active Rx for this.  Do you want pt to be on this medication still?  If so please send into [facility]

## 2020-06-15 NOTE — Telephone Encounter (Signed)
This was approved called [facility] went thru insurance 05/22/20 cost $248 due to deductible

## 2020-06-15 NOTE — Telephone Encounter (Signed)
error 

## 2020-06-15 NOTE — Telephone Encounter (Signed)
We will just have to discuss on next visit given the insurance hold ups.

## 2020-07-02 NOTE — Telephone Encounter (Signed)
P.A. WEGOVY appeal was approved til 05/10/2021 see next telephone call

## 2020-07-08 ENCOUNTER — Telehealth: Payer: Self-pay | Admitting: *Deleted

## 2020-07-08 NOTE — Telephone Encounter (Signed)
Covid booster appointment made for 07/12/20 @ 1:45p at the [facility] location. Provided main campus number for billing inquiries as requested.

## 2020-07-10 ENCOUNTER — Other Ambulatory Visit: Payer: Self-pay | Admitting: Medical

## 2020-07-12 ENCOUNTER — Ambulatory Visit: Payer: PRIVATE HEALTH INSURANCE | Attending: Internal Medicine

## 2020-07-12 ENCOUNTER — Other Ambulatory Visit: Payer: Self-pay

## 2020-07-12 DIAGNOSIS — Z23 Encounter for immunization: Secondary | ICD-10-CM

## 2020-07-12 NOTE — Progress Notes (Signed)
   Covid-19 Vaccination Clinic  Name:  Lisa Briggs    MRN: [id number] DOB: November 29, 1960  07/12/2020  Lisa Briggs was observed post Covid-19 immunization for 15 minutes without incident. She was provided with Vaccine Information Sheet and instruction to access the V-Safe system.   Lisa Briggs was instructed to call 911 with any severe reactions post vaccine: [place] Difficulty breathing  . Swelling of face and throat  . A fast heartbeat  . A bad rash all over body  . Dizziness and weakness   Immunizations Administered    Name Date Dose VIS Date Route   PFIZER Comrnaty(Gray TOP) Covid-19 Vaccine 07/12/2020  1:16 PM 0.3 mL 03/11/2020 Intramuscular   Manufacturer: [employer]   Lot: [id number]   [place]: [phone]

## 2020-07-18 ENCOUNTER — Other Ambulatory Visit: Payer: Self-pay | Admitting: Medical

## 2020-07-19 ENCOUNTER — Telehealth: Payer: Self-pay

## 2020-07-19 NOTE — Telephone Encounter (Signed)
Pt. Called stating that she needs all of her medications sent in to the [facility]. She had problems at [facility] and [facility] charged her too much.

## 2020-07-20 ENCOUNTER — Other Ambulatory Visit: Payer: Self-pay

## 2020-07-20 DIAGNOSIS — K2950 Unspecified chronic gastritis without bleeding: Secondary | ICD-10-CM

## 2020-07-20 MED ORDER — ASPIRIN EC 81 MG PO TBEC
81.0000 mg | DELAYED_RELEASE_TABLET | Freq: Every day | ORAL | 3 refills | Status: DC
Start: 2020-07-20 — End: 2021-06-03

## 2020-07-20 MED ORDER — FAMOTIDINE 20 MG PO TABS
ORAL_TABLET | ORAL | 3 refills | Status: DC
Start: 2020-07-20 — End: 2021-07-01

## 2020-07-20 MED ORDER — VITAMIN D 25 MCG (1000 UNIT) PO TABS
1000.0000 [IU] | ORAL_TABLET | Freq: Every day | ORAL | 3 refills | Status: DC
Start: 2020-07-20 — End: 2021-02-01

## 2020-07-20 MED ORDER — LINACLOTIDE 145 MCG PO CAPS: 145.0000 ug | ORAL_CAPSULE | Freq: Every day | ORAL | 1 refills | Status: DC

## 2020-07-20 MED ORDER — FLUTICASONE PROPIONATE 50 MCG/ACT NA SUSP: 1.0000 | Freq: Every day | NASAL | 11 refills | Status: DC

## 2020-07-20 MED ORDER — GABAPENTIN 300 MG PO CAPS: 300.0000 mg | ORAL_CAPSULE | Freq: Three times a day (TID) | ORAL | 3 refills | Status: DC

## 2020-07-20 MED ORDER — FEXOFENADINE HCL 180 MG PO TABS: 180.0000 mg | ORAL_TABLET | Freq: Every day | ORAL | 3 refills | Status: DC

## 2020-07-20 MED ORDER — POTASSIUM CHLORIDE CRYS ER 10 MEQ PO TBCR: 10.0000 meq | EXTENDED_RELEASE_TABLET | Freq: Two times a day (BID) | ORAL | 3 refills | Status: DC

## 2020-07-20 MED ORDER — DEXLANSOPRAZOLE 60 MG PO CPDR: 60.0000 mg | DELAYED_RELEASE_CAPSULE | Freq: Every day | ORAL | 3 refills | Status: DC

## 2020-07-20 MED ORDER — SIMVASTATIN 20 MG PO TABS: 20.0000 mg | ORAL_TABLET | Freq: Every day | ORAL | 3 refills | Status: DC

## 2020-07-20 MED ORDER — BD PEN NEEDLE NANO U/F 32G X 4 MM MISC: 1.0000 | Freq: Every day | 11 refills | Status: AC

## 2020-07-20 NOTE — Telephone Encounter (Signed)
Discussed with patient about the frequent changes of pharmacy. Patient stated she is going to stick with [facility] now. Medications has been sent per patient request.

## 2020-07-29 ENCOUNTER — Other Ambulatory Visit: Payer: Self-pay

## 2020-07-29 ENCOUNTER — Ambulatory Visit
Admission: RE | Admit: 2020-07-29 | Discharge: 2020-07-29 | Disposition: A | Discharge status: Home / Self Care | Payer: PRIVATE HEALTH INSURANCE | Source: Ambulatory Visit | Attending: Medical | Admitting: Medical

## 2020-07-29 DIAGNOSIS — Z1231 Encounter for screening mammogram for malignant neoplasm of breast: Secondary | ICD-10-CM

## 2020-07-30 ENCOUNTER — Other Ambulatory Visit (HOSPITAL_BASED_OUTPATIENT_CLINIC_OR_DEPARTMENT_OTHER): Payer: Self-pay

## 2020-07-30 MED ORDER — PFIZER-BIONT COVID-19 VAC-TRIS 30 MCG/0.3ML IM SUSP
INTRAMUSCULAR | 0 refills | Status: DC
  Filled 2020-07-30: qty 0.3, 1d supply, fill #0

## 2020-08-03 ENCOUNTER — Telehealth: Payer: Self-pay | Admitting: Medical

## 2020-08-03 ENCOUNTER — Encounter: Payer: Self-pay | Admitting: Cardiology

## 2020-08-03 ENCOUNTER — Ambulatory Visit: Payer: PRIVATE HEALTH INSURANCE | Admitting: Cardiology

## 2020-08-03 ENCOUNTER — Other Ambulatory Visit: Payer: Self-pay

## 2020-08-03 VITALS — BP 119/78 | HR 93 | Temp 97.4°F | Resp 16 | Ht 69.0 in | Wt 260.4 lb

## 2020-08-03 DIAGNOSIS — I10 Essential (primary) hypertension: Secondary | ICD-10-CM

## 2020-08-03 DIAGNOSIS — E782 Mixed hyperlipidemia: Secondary | ICD-10-CM

## 2020-08-03 DIAGNOSIS — I429 Cardiomyopathy, unspecified: Secondary | ICD-10-CM

## 2020-08-03 DIAGNOSIS — I5022 Chronic systolic (congestive) heart failure: Secondary | ICD-10-CM

## 2020-08-03 DIAGNOSIS — Z8673 Personal history of transient ischemic attack (TIA), and cerebral infarction without residual deficits: Secondary | ICD-10-CM

## 2020-08-03 DIAGNOSIS — Z6838 Body mass index (BMI) 38.0-38.9, adult: Secondary | ICD-10-CM

## 2020-08-03 MED ORDER — ENTRESTO 49-51 MG PO TABS: 1.0000 | ORAL_TABLET | Freq: Two times a day (BID) | ORAL | 0 refills | Status: DC

## 2020-08-03 NOTE — Telephone Encounter (Signed)
Patient has requested that a script be sent to her pharmacy requesting a glucometer, strips and lancets that her insurance will cover. Please write script so it can be faxed.

## 2020-08-03 NOTE — Progress Notes (Signed)
Lisa Briggs Date of Birth: 09/14/1960 MRN: [id number] Primary Care Provider:Tysinger, Camelia Eng, PA-C Former Cardiology Providers: Jeri Lager, APRN, FNP-C Primary Cardiologist: Rex Kras, DO, [facility] (established care 07/02/2019)  Date: 08/03/20 Last Office Visit: 02/04/2020  Chief Complaint  Patient presents with  . Cardiomyopathy  . Follow-up   HPI  Lisa Briggs is a 60 y.o.  female who presents to the office with a chief complaint of " 22-month follow-up for congestive heart failure" Patient's past medical history and cardiovascular risk factors include: Seropositive rheumatoid arthritis (Lisa Briggs), osteoarthritis, DDD, benign essential hypertension, transient ischemic attack, mildly reduced left ventricular systolic function consistent with cardiomyopathy, obesity.  Patient presents for 35-month follow-up today for management of cardiomyopathy and heart failure with reduced EF.  Since last office visit no hospitalizations or urgent care visits for cardiovascular symptoms.  She remains euvolemic.  Patient denies any chest pain at rest or with effort related activities.  Patient is compliant with her medical therapy.  Patient has lost approximately 5 pounds since last office visit and is motivated to lose more weight.  Of note, patient last echocardiogram noted an LVEF of 45-50% and nuclear stress test reported normal myocardial perfusion.  FUNCTIONAL STATUS: Walks with a 4 wheel walker. No regular routine or exercise program.    ALLERGIES: Allergies  Allergen Reactions  . Kiwi Extract Hives and Itching  . Mucinex Dm [Dm-Guaifenesin Er] Nausea Only    Messes up her stomach. Pt report on 01/21/15  . Peach [Prunus Persica] Itching    Peach peeling makes pt itch     MEDICATION LIST PRIOR TO VISIT: Current Outpatient Medications on File Prior to Visit  Medication Sig Dispense Refill  . aspirin EC 81 MG tablet Take 1 tablet (81 mg total) by mouth daily. 90 tablet 3  .  carvedilol (COREG) 6.25 MG tablet TAKE 1 TABLET BY MOUTH TWICE DAILY. 56 tablet 0  . cholecalciferol (VITAMIN D3) 25 MCG (1000 UNIT) tablet Take 1 tablet (1,000 Units total) by mouth daily. 90 tablet 3  . COVID-19 mRNA Vac-TriS, Pfizer, (PFIZER-BIONT COVID-19 VAC-TRIS) SUSP injection Inject into the muscle. 0.3 mL 0  . dexlansoprazole (DEXILANT) 60 MG capsule Take 1 capsule (60 mg total) by mouth daily. 90 capsule 3  . etodolac (LODINE) 400 MG tablet TAKE (1) TABLET TWICE DAILY AFTER MEALS. 56 tablet 0  . famotidine (PEPCID) 20 MG tablet TAKE 1 TABLET DAILY AT BEDTIME 90 tablet 3  . fexofenadine (ALLEGRA ALLERGY) 180 MG tablet Take 1 tablet (180 mg total) by mouth daily. 90 tablet 3  . fluticasone (FLONASE) 50 MCG/ACT nasal spray Place 1 spray into both nostrils daily. 16 g 11  . gabapentin (NEURONTIN) 300 MG capsule Take 1 capsule (300 mg total) by mouth 3 (three) times daily. 270 capsule 3  . hydroxychloroquine (PLAQUENIL) 200 MG tablet TAKE 1 TABLET TWICE DAILY FOR RHEUMATOID ARTHRITIS 180 tablet 0  . Insulin Pen Needle (BD PEN NEEDLE NANO U/F) 32G X 4 MM MISC 1 each by Does not apply route at bedtime. 100 each 11  . linaclotide (LINZESS) 145 MCG CAPS capsule Take 1 capsule (145 mcg total) by mouth daily before breakfast. 90 capsule 1  . Misc. Devices (ROLLATOR ULTRA-LIGHT) MISC Use as directed. 1 each 0  . ondansetron (ZOFRAN) 4 MG tablet Take 1 tablet (4 mg total) by mouth every 8 (eight) hours as needed for nausea or vomiting. TAKE ONE TABLET BY MOUTH EVERY 8 HOURS AS NEEDED FOR  NAUSEA  OR  VOMITING  90 tablet 0  . potassium chloride (KLOR-CON) 10 MEQ tablet Take 1 tablet (10 mEq total) by mouth 2 (two) times daily. (Patient taking differently: Take 10 mEq by mouth daily.) 180 tablet 3  . simvastatin (ZOCOR) 20 MG tablet Take 1 tablet (20 mg total) by mouth daily. 90 tablet 3  . spironolactone (ALDACTONE) 25 MG tablet TAKE 1 TABLET BY MOUTH DAILY. 28 tablet 0  . venlafaxine XR (EFFEXOR-XR)  37.5 MG 24 hr capsule TAKE 1 CAPSULE DAILY WITH BREAKFAST. 90 capsule 1   No current facility-administered medications on file prior to visit.    PAST MEDICAL HISTORY: Past Medical History:  Diagnosis Date  . Allergy   . Anemia    iron therapy for years as of 10/12; normal Hgb 08/2013  . Anxiety   . Arthritis   . Chest pain 04/05/2011   cardiac eval, normal treadmill stress test, Dr. Tollie Eth  . Chronic back pain   . Constipation   . Farsightedness    wears glasses, [facility] care center  . 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 [name], [facility]  . GERD (gastroesophageal reflux disease)   . History of uterine fibroid   . Hyperlipidemia   . Hypertension   . Paresthesia 09/2014   initially thought to be TIA, neurology consult in 12/2014 with other non TIA considerations.    . Polyarthralgia    normal rheumatoid screen 01/2012  . TIA (transient ischemic attack) 2015/16    PAST SURGICAL HISTORY: Past Surgical History:  Procedure Laterality Date  . COLONOSCOPY  01/2014   diverticulosis, othwerise normal - Dr. Owens Loffler  . ESOPHAGOGASTRODUODENOSCOPY  2013   Dr. Benson Norway, gastritis  . ESOPHAGOGASTRODUODENOSCOPY  [id number]  . EUS N/A 04/16/2014   Procedure: UPPER ENDOSCOPIC ULTRASOUND (EUS) LINEAR;  Surgeon: Milus Banister, MD;  Location: [facility];  Service: Endoscopy;  Laterality: N/A;  . gall stone surgery    . KNEE ARTHROSCOPY Left   . LAPAROSCOPIC GASTRIC RESECTION N/A 06/09/2014   Procedure: LAPAROSCOPIC GASTRIC MASS RESECTION;  Surgeon: [name], MD;  Location: [facility];  Service: General;  Laterality: N/A;  . LIPOMA EXCISION     forehead  . LUMBAR LAMINECTOMY N/A 04/25/2019   Procedure: LEFT L2-3 MICRODISCECTOMY, BILATERAL L5-S1 PARTIAL HEMILAMINECTOMY;  Surgeon: Jessy Oto, MD;  Location: [place];  Service: Orthopedics;  Laterality: N/A;  . UPPER GASTROINTESTINAL ENDOSCOPY    . UTERINE FIBROID SURGERY      FAMILY  HISTORY: The patient's family history includes Arthritis in her mother; Breast cancer in her mother and sister; Colon cancer (age of onset: 16) in her brother; Diabetes in her brother and maternal aunt; GER disease in her mother; Glaucoma in her mother; Healthy in her son; Heart disease in her maternal aunt, maternal grandfather, maternal grandmother, and maternal uncle; Hypertension in her mother, sister, and sister; Leukemia in her sister; Stroke in her father and maternal uncle.   SOCIAL HISTORY:  The patient  reports that she has never smoked. She has never used smokeless tobacco. She reports that she does not drink alcohol and does not use drugs.  Review of Systems  Constitutional: Negative for chills, fever and weight gain.  HENT: Negative for ear discharge, ear pain and nosebleeds.   Eyes: Negative for blurred vision and discharge.  Cardiovascular: Positive for dyspnea on exertion. Negative for chest pain, claudication, leg swelling, near-syncope, orthopnea, palpitations, paroxysmal nocturnal dyspnea and syncope.  Respiratory: Negative for cough and shortness of  breath.   Endocrine: Negative for polydipsia, polyphagia and polyuria.  Hematologic/Lymphatic: Negative for bleeding problem.  Skin: Negative for flushing and nail changes.  Musculoskeletal: Positive for arthritis and joint pain. Negative for muscle cramps, muscle weakness and myalgias.  Gastrointestinal: Positive for heartburn. Negative for abdominal pain, dysphagia, hematemesis, hematochezia, melena, nausea and vomiting.  Neurological: Negative for dizziness, focal weakness and light-headedness.    PHYSICAL EXAM: Vitals with BMI 08/03/2020 05/11/2020 04/28/2020  Height 5\' 9"  5\' 9"  5\' 9"   Weight 260 lbs 6 oz 266 lbs 10 oz 265 lbs  BMI 38.44 81.19 14.78  Systolic 295 621 308  Diastolic 78 85 85  Pulse 93 65 75   CONSTITUTIONAL: Age-appropriate African-American female, obese, no acute distress, hemodynamically stable.  SKIN:  Skin is warm and dry. No rash noted. No cyanosis. No pallor. No jaundice HEAD: Normocephalic and atraumatic.  EYES: No scleral icterus MOUTH/THROAT: Moist oral membranes.  NECK: No JVD present. No thyromegaly noted. No carotid bruits  LYMPHATIC: No visible cervical adenopathy.  CHEST Normal respiratory effort. No intercostal retractions  LUNGS: Clear to auscultation bilaterally.  No stridor. No wheezes. No rales.  CARDIOVASCULAR: Regular rate and rhythm, positive S1-S2, no murmurs rubs or gallops appreciated  ABDOMINAL: No apparent ascites.  EXTREMITIES: No peripheral edema. HEMATOLOGIC: No significant bruising NEUROLOGIC: Oriented to person, place, and time. Nonfocal. Normal muscle tone.  PSYCHIATRIC: Normal mood and affect. Normal behavior. Cooperative  CARDIAC DATABASE: EKG: 08/03/2020: Normal sinus rhythm, 88 bpm, normal axis, LAE, consider anteroseptal ischemia. No change compared to prior ECG.   Echocardiogram: 04/17/2019: LVEF 65-78%, grade 2 diastolic dysfunction, elevated left atrial pressure, mild TR, no pulmonary hypertension.  06/05/2019: LVEF 45-50%, normal wall thickness, mild global hypokinesis, grade 1 diastolic impairment, normal left atrial pressure, mild MR, mild TR, mild TR, no pulmonary hypertension.  Stress Testing:  Lexiscan  Sestamibi Stress Test 07/09/2019: Myocardial perfusion is normal. Stress LV EF: 51%.  No previous exam available for comparison. Low risk study.   Heart Catheterization: None  LABORATORY DATA: CBC Latest Ref Rng & Units 05/11/2020 11/19/2019 04/26/2019  WBC 3.8 - 10.8 Thousand/uL 6.2 6.0 11.7(H)  Hemoglobin 11.7 - 15.5 g/dL 12.9 12.6 11.3(L)  Hematocrit 35.0 - 45.0 % 40.8 40.1 36.4  Platelets 140 - 400 Thousand/uL 361 358 304    CMP Latest Ref Rng & Units 05/11/2020 11/19/2019 08/13/2019  Glucose 65 - 99 mg/dL 86 90 81  BUN 7 - 25 mg/dL 16 12 15   Creatinine 0.50 - 1.05 mg/dL 0.63 0.66 0.79  Sodium 135 - 146 mmol/L 143 141 144  Potassium  3.5 - 5.3 mmol/L 5.1 4.5 4.4  Chloride 98 - 110 mmol/L 106 103 105  CO2 20 - 32 mmol/L 31 26 26   Calcium 8.6 - 10.4 mg/dL 9.6 9.5 9.3  Total Protein 6.1 - 8.1 g/dL 6.8 7.0 -  Total Bilirubin 0.2 - 1.2 mg/dL 0.3 0.3 -  Alkaline Phos 48 - 121 IU/L - 134(H) -  AST 10 - 35 U/L 13 15 -  ALT 6 - 29 U/L 12 13 -    Lipid Panel  Lab Results  Component Value Date   CHOL 162 07/18/2019   HDL 77 07/18/2019   LDLCALC 71 07/18/2019   TRIG 70 07/18/2019   CHOLHDL 2.1 07/18/2019    Lab Results  Component Value Date   HGBA1C 6.0 (H) 11/19/2019   HGBA1C 5.9 (A) 07/18/2019   HGBA1C 5.9 (H) 12/17/2018   No components found for: NTPROBNP Lab Results  Component Value Date   TSH 1.700 11/19/2019   TSH 1.860 08/30/2018   TSH 0.56 09/20/2016    Cardiac Panel (last 3 results) No results for input(s): CKTOTAL, CKMB, TROPONINIHS, RELINDX in the last 72 hours.  FINAL MEDICATION LIST END OF ENCOUNTER: Meds ordered this encounter  Medications  . sacubitril-valsartan (ENTRESTO) 49-51 MG    Sig: Take 1 tablet by mouth 2 (two) times daily.    Dispense:  180 tablet    Refill:  0    Medications Discontinued During This Encounter  Medication Reason  . losartan (COZAAR) 50 MG tablet Change in therapy     Current Outpatient Medications:  .  aspirin EC 81 MG tablet, Take 1 tablet (81 mg total) by mouth daily., Disp: 90 tablet, Rfl: 3 .  carvedilol (COREG) 6.25 MG tablet, TAKE 1 TABLET BY MOUTH TWICE DAILY., Disp: 56 tablet, Rfl: 0 .  cholecalciferol (VITAMIN D3) 25 MCG (1000 UNIT) tablet, Take 1 tablet (1,000 Units total) by mouth daily., Disp: 90 tablet, Rfl: 3 .  COVID-19 mRNA Vac-TriS, Pfizer, (PFIZER-BIONT COVID-19 VAC-TRIS) SUSP injection, Inject into the muscle., Disp: 0.3 mL, Rfl: 0 .  dexlansoprazole (DEXILANT) 60 MG capsule, Take 1 capsule (60 mg total) by mouth daily., Disp: 90 capsule, Rfl: 3 .  etodolac (LODINE) 400 MG tablet, TAKE (1) TABLET TWICE DAILY AFTER MEALS., Disp: 56 tablet,  Rfl: 0 .  famotidine (PEPCID) 20 MG tablet, TAKE 1 TABLET DAILY AT BEDTIME, Disp: 90 tablet, Rfl: 3 .  fexofenadine (ALLEGRA ALLERGY) 180 MG tablet, Take 1 tablet (180 mg total) by mouth daily., Disp: 90 tablet, Rfl: 3 .  fluticasone (FLONASE) 50 MCG/ACT nasal spray, Place 1 spray into both nostrils daily., Disp: 16 g, Rfl: 11 .  gabapentin (NEURONTIN) 300 MG capsule, Take 1 capsule (300 mg total) by mouth 3 (three) times daily., Disp: 270 capsule, Rfl: 3 .  hydroxychloroquine (PLAQUENIL) 200 MG tablet, TAKE 1 TABLET TWICE DAILY FOR RHEUMATOID ARTHRITIS, Disp: 180 tablet, Rfl: 0 .  Insulin Pen Needle (BD PEN NEEDLE NANO U/F) 32G X 4 MM MISC, 1 each by Does not apply route at bedtime., Disp: 100 each, Rfl: 11 .  linaclotide (LINZESS) 145 MCG CAPS capsule, Take 1 capsule (145 mcg total) by mouth daily before breakfast., Disp: 90 capsule, Rfl: 1 .  Misc. Devices (ROLLATOR ULTRA-LIGHT) MISC, Use as directed., Disp: 1 each, Rfl: 0 .  ondansetron (ZOFRAN) 4 MG tablet, Take 1 tablet (4 mg total) by mouth every 8 (eight) hours as needed for nausea or vomiting. TAKE ONE TABLET BY MOUTH EVERY 8 HOURS AS NEEDED FOR  NAUSEA  OR  VOMITING, Disp: 90 tablet, Rfl: 0 .  potassium chloride (KLOR-CON) 10 MEQ tablet, Take 1 tablet (10 mEq total) by mouth 2 (two) times daily. (Patient taking differently: Take 10 mEq by mouth daily.), Disp: 180 tablet, Rfl: 3 .  sacubitril-valsartan (ENTRESTO) 49-51 MG, Take 1 tablet by mouth 2 (two) times daily., Disp: 180 tablet, Rfl: 0 .  simvastatin (ZOCOR) 20 MG tablet, Take 1 tablet (20 mg total) by mouth daily., Disp: 90 tablet, Rfl: 3 .  spironolactone (ALDACTONE) 25 MG tablet, TAKE 1 TABLET BY MOUTH DAILY., Disp: 28 tablet, Rfl: 0 .  venlafaxine XR (EFFEXOR-XR) 37.5 MG 24 hr capsule, TAKE 1 CAPSULE DAILY WITH BREAKFAST., Disp: 90 capsule, Rfl: 1  IMPRESSION:    ICD-10-CM   1. Cardiomyopathy, presumed nonischemic  I42.9 EKG 12-Lead    sacubitril-valsartan (ENTRESTO) 49-51 MG   2. Chronic HFrEF, NYHA  Class II/III, stage B (heart failure with reduced ejection fraction) (HCC)  I50.22 sacubitril-valsartan (ENTRESTO) 49-51 MG    Basic metabolic panel    Magnesium    Pro b natriuretic peptide (BNP)  3. Benign hypertension  I10   4. Mixed hyperlipidemia  E78.2   5. History of TIA (transient ischemic attack)  Z86.73   6. Class 2 severe obesity due to excess calories with serious comorbidity and body mass index (BMI) of 38.0 to 38.9 in adult [facility])  E66.01    Z68.38      RECOMMENDATIONS: Ilyssa Moleski is a 60 y.o. female whose past medical history and cardiovascular risk factors include: Seropositive rheumatoid arthritis (Lisa Briggs), osteoarthritis, DDD, benign essential hypertension, transient ischemic attack, mildly reduced left ventricular systolic function consistent with cardiomyopathy, obesity.  Chronic heart failure with reduced EF, stage B, NYHA class II/III:  No recent hospitalizations for congestive heart failure.  Appears to be euvolemic on clinical examination.  Lost 5# since the last office visit.   However, continues to have effort related dyspnea - chronic.   Discussed uptitrating GDMT.  Discontinue losartan and will start Entresto 49/51 mg p.o. twice daily.  Blood work in 1 week to evaluate kidney function and electrolytes.  If patient does well on Entresto will consider further up titration at the next office visit versus starting SGL 2 inhibitors.   Patient is asked to reduce her potassium intake.  Decrease K-Dur to 10 mEq p.o. daily.  Cardiomyopathy, presumed nonischemic:  See above.   History of TIA: Currently on aspirin and statin therapy.  Continue risk factor modifications for secondary prevention  Obesity, due to excess calories: Body mass index is 38.45 kg/m. . I reviewed with the patient the importance of diet, regular physical activity/exercise, weight loss.   . Patient is educated on increasing physical activity gradually as  tolerated.  With the goal of moderate intensity exercise for 30 minutes a day 5 days a week.  Orders Placed This Encounter  Procedures  . Basic metabolic panel  . Magnesium  . Pro b natriuretic peptide (BNP)  . EKG 12-Lead   --Continue cardiac medications as reconciled in final medication list. --Return in about 3 months (around 11/03/2020) for Follow up, heart failure management.. Or sooner if needed. --Continue follow-up with your primary care physician regarding the management of your other chronic comorbid conditions.  Patient's questions and concerns were addressed to her satisfaction. She voices understanding of the instructions provided during this encounter.   This note was created using a voice recognition software as a result there may be grammatical errors inadvertently enclosed that do not reflect the nature of this encounter. Every attempt is made to correct such errors.   Rex Kras, [place], [facility]  Pager: [phone] Office: [phone]

## 2020-08-03 NOTE — Telephone Encounter (Signed)
Pt called regarding her lancets for her meter

## 2020-08-03 NOTE — Telephone Encounter (Signed)
Pleaes call in glucometer #1, lancets and strips, #100, with 1 refill, dx: e11.69

## 2020-08-04 NOTE — Telephone Encounter (Signed)
Faxed rx to patient pharmacy.

## 2020-08-05 ENCOUNTER — Other Ambulatory Visit: Payer: Self-pay

## 2020-08-05 LAB — HM DIABETES EYE EXAM

## 2020-08-05 MED ORDER — BLOOD GLUCOSE MONITOR KIT: PACK | 0 refills | Status: AC

## 2020-08-09 ENCOUNTER — Other Ambulatory Visit: Payer: Self-pay

## 2020-08-09 DIAGNOSIS — I5022 Chronic systolic (congestive) heart failure: Secondary | ICD-10-CM

## 2020-08-12 ENCOUNTER — Other Ambulatory Visit: Payer: Self-pay | Admitting: Medical

## 2020-08-12 ENCOUNTER — Telehealth: Payer: Self-pay

## 2020-08-12 DIAGNOSIS — I429 Cardiomyopathy, unspecified: Secondary | ICD-10-CM

## 2020-08-12 NOTE — Telephone Encounter (Signed)
Pt. Called stating that she thought you had sent all of here medications to the [facility] on [address] but she never got her Venlafaxine 37.5mg  and now she is out of this medication. She said please make sure all of her other medications have been sent to [facility] on [address].

## 2020-08-13 ENCOUNTER — Other Ambulatory Visit: Payer: Self-pay | Admitting: Medical

## 2020-08-13 DIAGNOSIS — I429 Cardiomyopathy, unspecified: Secondary | ICD-10-CM

## 2020-08-13 MED ORDER — CARVEDILOL 6.25 MG PO TABS: 6.2500 mg | ORAL_TABLET | Freq: Two times a day (BID) | ORAL | 0 refills | Status: DC

## 2020-08-13 MED ORDER — VENLAFAXINE HCL ER 37.5 MG PO CP24: ORAL_CAPSULE | ORAL | 1 refills | Status: DC

## 2020-08-13 NOTE — Telephone Encounter (Signed)
Lisa Briggs can you please send this medication to [facility] on [address]

## 2020-08-13 NOTE — Telephone Encounter (Signed)
done

## 2020-08-19 ENCOUNTER — Encounter: Payer: Self-pay | Admitting: Medical

## 2020-08-21 LAB — BASIC METABOLIC PANEL
BUN/Creatinine Ratio: 15 (ref 9–23)
BUN: 13 mg/dL (ref 6–24)
CO2: 23 mmol/L (ref 20–29)
Calcium: 9.4 mg/dL (ref 8.7–10.2)
Chloride: 107 mmol/L — ABNORMAL HIGH (ref 96–106)
Creatinine, Ser: 0.86 mg/dL (ref 0.57–1.00)
Glucose: 87 mg/dL (ref 65–99)
Potassium: 4.2 mmol/L (ref 3.5–5.2)
Sodium: 144 mmol/L (ref 134–144)
eGFR: 78 mL/min/{1.73_m2} (ref >59)

## 2020-08-21 LAB — PRO B NATRIURETIC PEPTIDE: NT-Pro BNP: 35 pg/mL (ref 0–287)

## 2020-08-21 LAB — MAGNESIUM: Magnesium: 1.9 mg/dL (ref 1.6–2.3)

## 2020-08-23 ENCOUNTER — Encounter: Payer: Self-pay | Admitting: Medical

## 2020-08-23 NOTE — Progress Notes (Signed)
Called and spoke to pt regarding est results. Pt voiced understanding.

## 2020-09-09 ENCOUNTER — Telehealth: Payer: Self-pay

## 2020-09-09 ENCOUNTER — Encounter (HOSPITAL_COMMUNITY): Payer: Self-pay

## 2020-09-09 ENCOUNTER — Ambulatory Visit (INDEPENDENT_AMBULATORY_CARE_PROVIDER_SITE_OTHER): Payer: Medicare HMO

## 2020-09-09 ENCOUNTER — Other Ambulatory Visit: Payer: Self-pay | Admitting: Cardiology

## 2020-09-09 ENCOUNTER — Other Ambulatory Visit: Payer: Self-pay

## 2020-09-09 ENCOUNTER — Ambulatory Visit (HOSPITAL_COMMUNITY)
Admission: EM | Admit: 2020-09-09 | Discharge: 2020-09-09 | Disposition: A | Discharge status: Home / Self Care | Payer: Medicare HMO | Attending: Family Medicine | Admitting: Family Medicine

## 2020-09-09 DIAGNOSIS — I429 Cardiomyopathy, unspecified: Secondary | ICD-10-CM

## 2020-09-09 DIAGNOSIS — I5022 Chronic systolic (congestive) heart failure: Secondary | ICD-10-CM

## 2020-09-09 DIAGNOSIS — M25561 Pain in right knee: Secondary | ICD-10-CM | POA: Diagnosis not present

## 2020-09-09 DIAGNOSIS — R0782 Intercostal pain: Secondary | ICD-10-CM

## 2020-09-09 DIAGNOSIS — S20211A Contusion of right front wall of thorax, initial encounter: Secondary | ICD-10-CM

## 2020-09-09 DIAGNOSIS — R0781 Pleurodynia: Secondary | ICD-10-CM | POA: Diagnosis not present

## 2020-09-09 DIAGNOSIS — W19XXXA Unspecified fall, initial encounter: Secondary | ICD-10-CM

## 2020-09-09 DIAGNOSIS — M1711 Unilateral primary osteoarthritis, right knee: Secondary | ICD-10-CM | POA: Diagnosis not present

## 2020-09-09 MED ORDER — MELOXICAM 15 MG PO TABS: 15.0000 mg | ORAL_TABLET | Freq: Every day | ORAL | 0 refills | Status: DC

## 2020-09-09 NOTE — ED Triage Notes (Signed)
Pt in with c/o right knee and rib cage pain after she fell yesterday at [facility] when she stepped wrong off of the curb  Pt states she took bath in epsom salt but is still having pain

## 2020-09-09 NOTE — Telephone Encounter (Signed)
Pt needs refills Ondansetron, test strips & would like Diclofenac 1% topical gel (was previously prescribed by orthopedic Biagio Borg PA)  all sent to [facility]

## 2020-09-10 ENCOUNTER — Other Ambulatory Visit: Payer: Self-pay | Admitting: Medical

## 2020-09-10 DIAGNOSIS — R110 Nausea: Secondary | ICD-10-CM

## 2020-09-10 MED ORDER — ONDANSETRON HCL 4 MG PO TABS: 4.0000 mg | ORAL_TABLET | Freq: Three times a day (TID) | ORAL | 0 refills | Status: DC | PRN

## 2020-09-10 MED ORDER — DICLOFENAC SODIUM 1 % EX GEL: 2.0000 g | Freq: Four times a day (QID) | CUTANEOUS | 0 refills | Status: DC

## 2020-09-10 MED ORDER — BLOOD GLUCOSE TEST VI STRP: 1.0000 | ORAL_STRIP | Freq: Every day | 5 refills | Status: AC

## 2020-09-10 NOTE — Telephone Encounter (Signed)
error 

## 2020-09-10 NOTE — Progress Notes (Signed)
done

## 2020-09-10 NOTE — Telephone Encounter (Signed)
done

## 2020-09-14 NOTE — ED Provider Notes (Signed)
[place]   [id number] 09/09/20 Arrival Time: 1528  ASSESSMENT & PLAN:  1. Acute pain of right knee   2. Rib contusion, right, initial encounter     I have personally viewed the imaging studies ordered this visit. RIBS: no acute fracture appreciated. R knee: no fracture appreciated.  Begin trial of: Meds ordered this encounter  Medications   meloxicam (MOBIC) 15 MG tablet    Sig: Take 1 tablet (15 mg total) by mouth daily.    Dispense:  10 tablet    Refill:  0   WBAT.  Recommend:  Follow-up Information     Tysinger, Camelia Eng, PA-C .   Specialty: Family Medicine Why: As needed. Contact information: [place] [address] [place] .   Why: If worsening or failing to improve as anticipated. Contact information: [address] [place] [phone]                Reviewed expectations re: course of current medical issues. Questions answered. Outlined signs and symptoms indicating need for more acute intervention. Patient verbalized understanding. After Visit Summary given.  SUBJECTIVE: History from: patient. Lisa Briggs is a 60 y.o. female who reports falling yesterday; hit R knee and R ribs; painful both areas since. Able to bear wt with some pain. No SOB. Bath in [place] salts did not help. No other tx. No extremity sensation changes or weakness. No head injury.   Past Surgical History:  Procedure Laterality Date   COLONOSCOPY  01/2014   diverticulosis, othwerise normal - Dr. Owens Loffler   ESOPHAGOGASTRODUODENOSCOPY  2013   Dr. Benson Norway, gastritis   ESOPHAGOGASTRODUODENOSCOPY  [id number]   EUS N/A 04/16/2014   Procedure: UPPER ENDOSCOPIC ULTRASOUND (EUS) LINEAR;  Surgeon: Milus Banister, MD;  Location: [facility];  Service: Endoscopy;  Laterality: N/A;   gall stone surgery     KNEE ARTHROSCOPY Left    LAPAROSCOPIC GASTRIC RESECTION N/A  06/09/2014   Procedure: LAPAROSCOPIC GASTRIC MASS RESECTION;  Surgeon: [name], MD;  Location: [facility];  Service: General;  Laterality: N/A;   LIPOMA EXCISION     forehead   LUMBAR LAMINECTOMY N/A 04/25/2019   Procedure: LEFT L2-3 MICRODISCECTOMY, BILATERAL L5-S1 PARTIAL HEMILAMINECTOMY;  Surgeon: Jessy Oto, MD;  Location: [facility];  Service: Orthopedics;  Laterality: N/A;   UPPER GASTROINTESTINAL ENDOSCOPY     UTERINE FIBROID SURGERY        OBJECTIVE:  Vitals:   09/09/20 1637  BP: 120/70  Pulse: 68  Resp: 17  Temp: 98.5 F (36.9 C)  TempSrc: Oral  SpO2: 100%    General appearance: alert; no distress HEENT: [place]; AT Neck: supple with FROM Resp: unlabored respirations Chest wall: vague TTP over R lower lateral ribs; no overlying skin changes Extremities: RLE: warm with well perfused appearance; poorly localized moderate tenderness over right anterior/medial knee; without gross deformities; swelling: none; bruising: none; knee ROM: normal CV: brisk extremity capillary refill of RLE; 2+ DP pulse of RLE. Skin: warm and dry; no visible rashes Neurologic: gait normal; normal sensation and strength of RLE Psychological: alert and cooperative; normal mood and affect  Imaging: DG Ribs Unilateral W/Chest Right  Result Date: 09/09/2020 CLINICAL DATA:  Trauma, fell today, LEFT lower rib pain EXAM: RIGHT RIBS AND CHEST - 3+ VIEW COMPARISON:  None FINDINGS: Upper normal size of cardiac silhouette. Mediastinal contours and pulmonary vascularity normal. Lungs  clear. No pulmonary infiltrate, pleural effusion, or pneumothorax. Osseous mineralization normal. Scattered endplate spur formation thoracic spine. No acute RIGHT rib abnormalities. IMPRESSION: No acute abnormalities. Electronically Signed   By: Lavonia Dana M.D.   On: 09/09/2020 17:29   DG Knee Complete 4 Views Right  Result Date: 09/09/2020 CLINICAL DATA:  Generalized knee pain post fall today EXAM: RIGHT KNEE - COMPLETE 4+ VIEW  COMPARISON:  10/24/2017 FINDINGS: Osseous demineralization. Medial compartment joint space narrowing and spur formation. Additional degenerative changes at patellofemoral joint. No acute fracture, dislocation, or bone destruction. No joint effusion. IMPRESSION: Degenerative changes and osseous demineralization RIGHT knee. No acute abnormalities. Electronically Signed   By: Lavonia Dana M.D.   On: 09/09/2020 17:31       Allergies  Allergen Reactions   Kiwi Extract Hives and Itching   Mucinex Dm [Dm-Guaifenesin Er] Nausea Only    Messes up her stomach. Pt report on 01/21/15   Peach [Prunus Persica] Itching    Peach peeling makes pt itch    Past Medical History:  Diagnosis Date   Allergy    Anemia    iron therapy for years as of 10/12; normal Hgb 08/2013   Anxiety    Arthritis    Chest pain 04/05/2011   cardiac eval, normal treadmill stress test, Dr. 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 [name], [facility]   GERD (gastroesophageal reflux disease)    History of uterine fibroid    Hyperlipidemia    Hypertension    Paresthesia 09/2014   initially thought to be TIA, neurology consult in 12/2014 with other non TIA considerations.     Polyarthralgia    normal rheumatoid screen 01/2012   TIA (transient ischemic attack) 2015/16   Social History   Socioeconomic History   Marital status: Married    Spouse name: Not on file   Number of children: 1   Years of education: Not on file   Highest education level: Not on file  Occupational History   Occupation: IT sales professional: [employer]  Tobacco Use   Smoking status: Never   Smokeless tobacco: Never  Vaping Use   Vaping Use: Never used  Substance and Sexual Activity   Alcohol use: No    Alcohol/week: 0.0 standard drinks   Drug use: No   Sexual activity: Not on file  Other  Topics Concern   Not on file  Social History Narrative   Married, has 1 son in [place] and some grandchildren.  Glass blower/designer.  Active on job.  Does stretching and exercises daily as per physical therapy.  Works 12 hours daily.     Social Determinants of Health   Financial Resource Strain: Not on file  Food Insecurity: Not on file  Transportation Needs: Not on file  Physical Activity: Not on file  Stress: Not on file  Social Connections: Not on file   Family History  Problem Relation Age of Onset   Hypertension Mother    Arthritis Mother    GER disease Mother    Glaucoma Mother    Breast cancer Mother    Stroke Father    Breast cancer Sister        breast cancer dx late 31s   Hypertension Sister    Colon cancer Brother 75   Diabetes Brother  Diabetes Maternal Aunt    Heart disease Maternal Aunt    Heart disease Maternal Grandmother    Heart disease Maternal Grandfather    Heart disease Maternal Uncle    Stroke Maternal Uncle    Leukemia Sister    Hypertension Sister    Healthy Son    Colon polyps Neg Hx    Esophageal cancer Neg Hx    Stomach cancer Neg Hx    Rectal cancer Neg Hx    Past Surgical History:  Procedure Laterality Date   COLONOSCOPY  01/2014   diverticulosis, othwerise normal - Dr. Owens Loffler   ESOPHAGOGASTRODUODENOSCOPY  2013   Dr. Benson Norway, gastritis   ESOPHAGOGASTRODUODENOSCOPY  [id number]   EUS N/A 04/16/2014   Procedure: UPPER ENDOSCOPIC ULTRASOUND (EUS) LINEAR;  Surgeon: Milus Banister, MD;  Location: [facility];  Service: Endoscopy;  Laterality: N/A;   gall stone surgery     KNEE ARTHROSCOPY Left    LAPAROSCOPIC GASTRIC RESECTION N/A 06/09/2014   Procedure: LAPAROSCOPIC GASTRIC MASS RESECTION;  Surgeon: [name], MD;  Location: [facility];  Service: General;  Laterality: N/A;   LIPOMA EXCISION     forehead   LUMBAR LAMINECTOMY N/A 04/25/2019   Procedure: LEFT L2-3 MICRODISCECTOMY, BILATERAL L5-S1 PARTIAL HEMILAMINECTOMY;  Surgeon: Jessy Oto, MD;  Location: [facility];  Service: Orthopedics;  Laterality: N/A;   UPPER GASTROINTESTINAL ENDOSCOPY     UTERINE FIBROID SURGERY         Vanessa Kick, MD 09/14/20 [phone]

## 2020-09-15 ENCOUNTER — Ambulatory Visit (INDEPENDENT_AMBULATORY_CARE_PROVIDER_SITE_OTHER): Payer: Medicare HMO | Admitting: Family Medicine

## 2020-09-15 ENCOUNTER — Other Ambulatory Visit: Payer: Self-pay

## 2020-09-15 VITALS — BP 121/76 | Ht 69.0 in | Wt 260.0 lb

## 2020-09-15 DIAGNOSIS — M7061 Trochanteric bursitis, right hip: Secondary | ICD-10-CM

## 2020-09-15 DIAGNOSIS — G8929 Other chronic pain: Secondary | ICD-10-CM | POA: Diagnosis not present

## 2020-09-15 DIAGNOSIS — M25561 Pain in right knee: Secondary | ICD-10-CM | POA: Diagnosis not present

## 2020-09-15 NOTE — Progress Notes (Signed)
    SUBJECTIVE:   CHIEF COMPLAINT / HPI:   Right Knee Pain Lisa Briggs is a very pleasant 60y/o female who presents today for follow up due to right knee pain. She was seen and evaluated in the [facility] urgent care and had x-rays showing osteoarthritis. No fracture. She has had pain since. She walks with the assistance of a cane and was doing this before the fall. She has diffuse knee pain and it is tender to touch. It hurts to weight bear but she can walk with her cane. She has been taking Meloxicam and states that it offers little relief.   Right Hip Pain Lateral hip pain that is tender. Worse with movement. No groin pain and this occurred after the fall. She cannot lay on that side.  PERTINENT  PMH / PSH: HCN, HTN, GERD, DDD, Hx of OA in multiple joints  OBJECTIVE:   BP 121/76   Ht 5\' 9"  (1.753 m)   Wt 260 lb (117.9 kg)   LMP 02/23/2015 (LMP Unknown)   BMI 38.40 kg/m   No flowsheet data found.  Knee, Right: Inspection was negative for erythema, ecchymosis, and effusion. No obvious bony abnormalities or signs of osteophyte development. Palpation yielded no asymmetric warmth; Diffuse tenderness to palpation over the knee. No patellar tenderness; No patellar crepitus. Patellar and quadriceps tendons unremarkable, and no tenderness of the pes anserine bursa. ROM normal in flexion (135 degrees) and extension (0 degrees). Normal hamstring and quadriceps strength. Neurovascularly intact bilaterally. Special Tests  - Cruciate Ligaments:   - Anterior Drawer:  NEG - Posterior Drawer: NEG  - Collateral Ligaments:   - Varus/Valgus Stress test: NEG  - Meniscus:   - McMurray's: NEG  - Patella:   - Patellar grind/compression: NEG   Hip, Right: ROM full in all directions; Strength 5/5 in IR/ER/Flex/Ext/Abd/Add. Pelvic alignment unremarkable to inspection and palpation. Greater trochanter with tenderness to palpation. SI movement.     ASSESSMENT/PLAN:   Chronic pain of right knee Patient with  right knee pain after a fall and x-rays showing moderate medial femoral compartment OA. I did offer her a corticosteroid injection but patient declined. - Cont Meloxicam as needed for pain - Formal PT - F/u as needed  Greater trochanteric bursitis, right TTP over greater trochanter with no groin pain and full pain free ROM of anatomical hip. Again, patient declines shot today. - Formal PT - Ice the area especially after activity - Meloxicam and Voltaren gel as needed - F/u if no improvement and I would get x-rays of the right hip for further evaluation     Nuala Alpha, DO PGY-4, Sports Medicine Fellow [place]  Addendum:  I was the preceptor for this visit and available for immediate consultation.  Karlton Lemon MD Kirt Boys

## 2020-09-15 NOTE — Assessment & Plan Note (Signed)
TTP over greater trochanter with no groin pain and full pain free ROM of anatomical hip. Again, patient declines shot today. - Formal PT - Ice the area especially after activity - Meloxicam and Voltaren gel as needed - F/u if no improvement and I would get x-rays of the right hip for further evaluation

## 2020-09-15 NOTE — Assessment & Plan Note (Signed)
Patient with right knee pain after a fall and x-rays showing moderate medial femoral compartment OA. I did offer her a corticosteroid injection but patient declined. - Cont Meloxicam as needed for pain - Formal PT - F/u as needed

## 2020-09-15 NOTE — Patient Instructions (Signed)
It was great to meet you today! Thank you for letting me participate in your care!  Today, we discussed your right knee and hip pain. You most likely are having a flare up of arthritis in your knee. You can continue taking Meloxicam for pain. I also recommend you use the Voltaren Gel. Your PCP has called it in for you. Please go to physical therapy for rehab.  For your right hip it seems like you have greater trochanteric bursitis. I will have you do physical therapy for his as well as using ice packs and Voltaren Gel.   If you have no improvement please return to the clinic so we can continue a work up.  Be well, Harolyn Rutherford, DO PGY-4, Sports Medicine Fellow [place]

## 2020-09-16 ENCOUNTER — Other Ambulatory Visit: Payer: Self-pay

## 2020-09-16 DIAGNOSIS — I5022 Chronic systolic (congestive) heart failure: Secondary | ICD-10-CM

## 2020-09-16 DIAGNOSIS — I429 Cardiomyopathy, unspecified: Secondary | ICD-10-CM

## 2020-09-16 MED ORDER — ENTRESTO 49-51 MG PO TABS: 1.0000 | ORAL_TABLET | Freq: Two times a day (BID) | ORAL | 0 refills | Status: DC

## 2020-09-17 DIAGNOSIS — M25561 Pain in right knee: Secondary | ICD-10-CM | POA: Diagnosis not present

## 2020-09-21 ENCOUNTER — Other Ambulatory Visit: Payer: Self-pay | Admitting: Rheumatology

## 2020-09-21 DIAGNOSIS — M0579 Rheumatoid arthritis with rheumatoid factor of multiple sites without organ or systems involvement: Secondary | ICD-10-CM

## 2020-09-21 NOTE — Telephone Encounter (Signed)
Last Visit: 05/11/2020  Next Visit: 10/12/2020  Labs: 05/11/2020 CBC and CMP are stable.  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

## 2020-09-22 ENCOUNTER — Ambulatory Visit: Payer: Medicare HMO | Admitting: Physical Therapy

## 2020-09-24 ENCOUNTER — Other Ambulatory Visit: Payer: Self-pay

## 2020-09-24 ENCOUNTER — Ambulatory Visit: Payer: PRIVATE HEALTH INSURANCE | Attending: Family Medicine

## 2020-09-24 ENCOUNTER — Telehealth: Payer: Self-pay

## 2020-09-24 DIAGNOSIS — G8929 Other chronic pain: Secondary | ICD-10-CM | POA: Diagnosis not present

## 2020-09-24 DIAGNOSIS — R262 Difficulty in walking, not elsewhere classified: Secondary | ICD-10-CM | POA: Diagnosis not present

## 2020-09-24 DIAGNOSIS — M5442 Lumbago with sciatica, left side: Secondary | ICD-10-CM | POA: Insufficient documentation

## 2020-09-24 DIAGNOSIS — M5441 Lumbago with sciatica, right side: Secondary | ICD-10-CM | POA: Diagnosis not present

## 2020-09-24 DIAGNOSIS — M25561 Pain in right knee: Secondary | ICD-10-CM | POA: Diagnosis not present

## 2020-09-24 DIAGNOSIS — M25551 Pain in right hip: Secondary | ICD-10-CM | POA: Diagnosis not present

## 2020-09-24 DIAGNOSIS — M6281 Muscle weakness (generalized): Secondary | ICD-10-CM | POA: Insufficient documentation

## 2020-09-24 NOTE — Therapy (Signed)
[place], [place], [address] Phone: [phone]   Fax:  [phone]  Physical Therapy Evaluation  Patient Details  Name: Lisa Briggs MRN: [id number] Date of Birth: 05-12-1960 Referring Provider (PT): Dene Gentry, MD   Encounter Date: 09/24/2020   PT End of Session - 09/24/20 1105     Visit Briggs 1    Briggs of Visits 9    Date for PT Re-Evaluation 11/19/20    Authorization Type [employer] MCR    Authorization Time Period 6th visit FOTO - 10th Visit FOTO/Re-eval    Progress Note Due on Visit 10    PT Start Time 1016    PT Stop Time 1103    PT Time Calculation (min) 47 min    Activity Tolerance Patient tolerated treatment well    Behavior During Therapy [facility] for tasks assessed/performed             Past Medical History:  Diagnosis Date   Allergy    Anemia    iron therapy for years as of 10/12; normal Hgb 08/2013   Anxiety    Arthritis    Chest pain 04/05/2011   cardiac eval, normal treadmill stress test, Dr. 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 [name], [facility]   GERD (gastroesophageal reflux disease)    History of uterine fibroid    Hyperlipidemia    Hypertension    Paresthesia 09/2014   initially thought to be TIA, neurology consult in 12/2014 with other non TIA considerations.     Polyarthralgia    normal rheumatoid screen 01/2012   TIA (transient ischemic attack) 2015/16    Past Surgical History:  Procedure Laterality Date   COLONOSCOPY  01/2014   diverticulosis, othwerise normal - Dr. Owens Loffler   ESOPHAGOGASTRODUODENOSCOPY  2013   Dr. Benson Norway, gastritis   ESOPHAGOGASTRODUODENOSCOPY  [id number]   EUS N/A 04/16/2014   Procedure: UPPER ENDOSCOPIC ULTRASOUND (EUS) LINEAR;  Surgeon: Milus Banister, MD;  Location: [facility];  Service:  Endoscopy;  Laterality: N/A;   gall stone surgery     KNEE ARTHROSCOPY Left    LAPAROSCOPIC GASTRIC RESECTION N/A 06/09/2014   Procedure: LAPAROSCOPIC GASTRIC MASS RESECTION;  Surgeon: [name], MD;  Location: [facility];  Service: General;  Laterality: N/A;   LIPOMA EXCISION     forehead   LUMBAR LAMINECTOMY N/A 04/25/2019   Procedure: LEFT L2-3 MICRODISCECTOMY, BILATERAL L5-S1 PARTIAL HEMILAMINECTOMY;  Surgeon: Jessy Oto, MD;  Location: [facility];  Service: Orthopedics;  Laterality: N/A;   UPPER GASTROINTESTINAL ENDOSCOPY     UTERINE FIBROID SURGERY      There were no vitals filed for this visit.    Subjective Assessment - 09/24/20 1018     Subjective Lisa Briggs reports that she has been having knee pain for several years due to arthritis.  She reports that she fell in May of 2022.  She reports having increase of right knee pain and onset of right hip pain after that.  She reports a constant pain in the knee and hip.  The patient runs the side of the hip from the knee to the hip.  She reports swelling in the right knee that is still there.  She as provided a hinged knee brace.  She did have x-rays performed post the fall. Lisa Briggs reports  that she has been using a cane walking before the fall.  She reports that will elevate the leg and put ice on it.    Limitations Lifting;Walking;Standing;House hold activities    How long can you stand comfortably? 30 minutes    How long can you walk comfortably? 20-30 minutes with AD    Diagnostic tests x-rays : right knee: FINDINGS:  Osseous demineralization.     Medial compartment joint space narrowing and spur formation.     Additional degenerative changes at patellofemoral joint.     No acute fracture, dislocation, or bone destruction.     No joint effusion.     IMPRESSION:  Degenerative changes and osseous demineralization RIGHT knee.    Patient Stated Goals To get her leg strong    Currently in Pain? Yes    Pain Score 4    Worse: 6/10   Pain Location  Knee    Pain Orientation Right    Pain Descriptors / Indicators Aching;Burning    Pain Type Acute pain;Chronic pain    Pain Onset 1 to 4 weeks ago    Pain Frequency Constant    Aggravating Factors  walking and standing.    Pain Relieving Factors ice, heat, elevation    Multiple Pain Sites Yes    Pain Score 5   wrose 9/10   Pain Location Hip    Pain Orientation Right    Pain Descriptors / Indicators Sharp;Shooting;Sore    Pain Onset 1 to 4 weeks ago    Aggravating Factors  walking, lifting, carrying gorceries.    Pain Relieving Factors Ice and rest    Effect of Pain on Daily Activities Limited with walking duration, need to rest.  Limited with lifting groceries, avoids stairs (may need to scoot up/down on buttock)                [facility] PT Assessment - 09/24/20 0001       Assessment   Medical Diagnosis M70.61 (ICD-10-CM) - Trochanteric bursitis of right hip  M25.561 (ICD-10-CM) - Acute pain of right knee    Referring Provider (PT) Dene Gentry, MD    Onset Date/Surgical Date --   May 2022   Hand Dominance Right    Prior Therapy Yes for LBP and leg      Precautions   Precautions None      Restrictions   Weight Bearing Restrictions No      Balance Screen   Has the patient fallen in the past 6 months Yes    How many times? 1    Has the patient had a decrease in activity level because of a fear of falling?  No    Is the patient reluctant to leave their home because of a fear of falling?  No      Home Environment   Living Environment Private residence    Living Arrangements Spouse/significant other    Type of [place] to enter    Home Layout Two level      Prior Function   Level of Independence Independent with basic ADLs    Vocation On disability    Leisure shopping and crafts      Cognition   Overall Cognitive Status Within Functional Limits for tasks assessed      Observation/Other Assessments   Focus on Therapeutic Outcomes (FOTO)   Hip 52% ; Predicted 67%      Sit to Stand   Comments Use  of hands bilaterally with delay into lumbar extension.      Posture/Postural Control   Postural Limitations Increased lumbar lordosis;Rounded Shoulders;Forward head      ROM / Strength   AROM / PROM / Strength AROM;Strength;PROM      AROM   Right Knee Extension -5    Right Knee Flexion 120    Left Knee Extension 0    Left Knee Flexion 125    Lumbar Flexion 75% use of hands to return to standing    Lumbar Extension 0 % pain aggravated    Lumbar - Right Side Bend mid thigh    Lumbar - Left Side Bend 0% pain aggravated    Lumbar - Right Rotation 25%    Lumbar - Left Rotation 25%      PROM   Right Hip External Rotation  --   WFL   Right Hip Internal Rotation  --   WFL   Left Hip External Rotation  --   WFL   Left Hip Internal Rotation  --   WFL     Palpation   Palpation comment Moderate tenderness R TFL and piriformis      Special Tests    Special Tests Lumbar      FABER test   findings Positive    Comment Right and Left      Straight Leg Raise   Findings Negative                        Objective measurements completed on examination: See above findings.       Lisa Briggs Adult PT Treatment/Exercise - 09/24/20 0001       Ambulation/Gait   Ambulation/Gait Yes    Ambulation/Gait Assistance 6: Modified independent (Device/Increase time)    Ambulation Distance (Feet) 100 Feet    Assistive device Small based quad cane    Gait Pattern Decreased stance time - right;Poor foot clearance - right    Ambulation Surface Level    Gait velocity decreased      Exercises   Exercises Knee/Hip;Lumbar      Lumbar Exercises: Supine   Pelvic Tilt 10 reps;5 seconds      Knee/Hip Exercises: Supine   Quad Sets Strengthening;Right;15 reps    Heel Slides AROM;Right;15 reps                      PT Short Term Goals - 09/24/20 0956       PT SHORT TERM GOAL #1   Title The patient will be  independent in an initial HEP.    Time 2    Period Weeks    Target Date 10/08/20               PT Long Term Goals - 09/24/20 0957       PT LONG TERM GOAL #1   Title The patient will have an improved FOTO score of  67% for functional activities.    Baseline 52%    Time 8    Period Weeks    Target Date 11/19/20      PT LONG TERM GOAL #2   Title The patient will be able to walk using cane for 30 with pain under or equal to 4/10.    Baseline 8/10 pain    Time 8    Period Weeks    Target Date 11/19/20      PT LONG TERM GOAL #3   Title The patinet  will be able to perform a single leg stance for 10 seconds on each leg for safely with mobility.    Time 8    Period Weeks    Target Date 11/19/20                    Plan - 09/24/20 1039     Clinical Impression Statement Lisa Briggs is a 60 y/o female who reports having a fall in May of 2022.  She had an incresase of right knee pain and onset of righ hip pain post the fall.  She has a past medical history of RA, OA, DDD lumbar, and cardiomypathy.  The patient is donning a right hinged knee brace and using a cane for ambulation.  She was using the cane prior to the fall.  The patient presents with limited lumbar motion with aggravation of symptoms.  Likely symptoms are related to the lumbar spine.  Strength was not assessed today due to time and pain.  She lacks full right knee active extension motion.  The patinet is limited gait, transfers, and stairs.  Recommend physical therapy to return to premorbid state.    Personal Factors and Comorbidities Comorbidity 3+    Comorbidities RA, OA, DDD lumbar, and cardiomyopathy    Examination-Activity Limitations Bathing;Locomotion Level;Transfers;Bed Mobility;Caring for Others;Carry;Lift;Stairs;Squat;Stand;Toileting    Examination-Participation Restrictions Shop;Laundry;Meal Prep    Stability/Clinical Decision Making Evolving/Moderate complexity    Clinical Decision Making Moderate     Rehab Potential Good    PT Frequency 2x / week    PT Duration 8 weeks    PT Treatment/Interventions ADLs/Self Care Home Management;Aquatic Therapy;Electrical Stimulation;Moist Heat;Traction;Gait training;Stair training;Functional mobility training;Therapeutic activities;Therapeutic exercise;Balance training;DME Instruction;Neuromuscular re-education;Manual techniques;Patient/family education;Dry needling;Joint Manipulations;Spinal Manipulations;Taping    PT Next Visit Plan Asess balance, review FOTO, stretches for hip/low back, quad/hip/core stabilization, manual therapy    PT Home Exercise Plan Access Code: [email or web address]    Consulted and Agree with Plan of Care Patient             Patient will benefit from skilled therapeutic intervention in order to improve the following deficits and impairments:  Abnormal gait, Decreased range of motion, Difficulty walking, Decreased endurance, Decreased activity tolerance, Pain, Decreased balance, Decreased mobility, Decreased strength  Visit Diagnosis: Right knee pain, unspecified chronicity - Plan: PT plan of care cert/re-cert  Pain in right hip - Plan: PT plan of care cert/re-cert  Difficulty in walking, not elsewhere classified - Plan: PT plan of care cert/re-cert     Problem List Patient Active Problem List   Diagnosis Date Noted   Prediabetes 02/19/2020   Menopause 11/25/2019   Cardiomyopathy ([place]) 11/19/2019   BMI 38.0-38.9,adult 11/19/2019   Rheumatoid arthritis involving multiple sites with positive rheumatoid factor ([place]) 11/19/2019   Primary osteoarthritis involving multiple joints 11/19/2019   Greater trochanteric bursitis, right 11/19/2019   Decreased activities of daily living (ADL) 11/19/2019   Gait disturbance 11/19/2019   Chronic gastritis without bleeding 11/19/2019   Use of cane as ambulatory aid 11/19/2019   Pain in left shoulder 10/02/2019   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    Class: Chronic   Spinal  stenosis of lumbar region 04/25/2019    Class: Chronic   Status post lumbar laminectomy 04/25/2019   Neck pain 11/12/2018   Chronic pain of right knee 09/13/2018   Bruising 09/13/2018   SOB (shortness of breath) 08/30/2018   Chronic pain of left knee 07/08/2018   Chronic hip pain 07/08/2018   Edema 07/08/2018  Left foot pain 04/08/2018   Olecranon bursitis of left elbow 04/05/2018   Leg swelling 12/05/2017   Need for shingles vaccine 12/05/2017   Primary osteoarthritis of both hands 11/23/2017   Rheumatoid factor positive 11/23/2017   Primary osteoarthritis of both knees 11/23/2017   Primary osteoarthritis of both feet 11/23/2017   DDD (degenerative disc disease), lumbar 11/23/2017   Vaccine counseling 08/13/2017   Polyarthralgia 08/13/2017   Joint stiffness 08/13/2017   Sensitive skin 01/23/2017   Need for influenza vaccination 01/23/2017   Proteinuria 01/23/2017   History of gastrointestinal stromal tumor (GIST) 04/20/2016   History of TIA (transient ischemic attack) 04/20/2016   Screening for breast cancer 04/20/2016   Estrogen deficiency 04/20/2016   Chronic maxillary sinusitis 04/20/2016   Impaired fasting blood sugar 04/20/2016   Constipation 04/20/2016   History of fall 04/20/2016   Chronic radicular lumbar pain 01/27/2016   Encounter for health maintenance examination in adult 03/22/2015   Paresthesia 03/22/2015   Cognitive decline 03/22/2015   Screening for cervical cancer 03/22/2015   Vitamin D deficiency 03/22/2015   History of uterine leiomyoma 03/22/2015   Gastroesophageal reflux disease without esophagitis 03/02/2014   Chronic nausea 03/02/2014   Chronic abdominal pain 03/02/2014   Rhinitis, allergic 03/02/2014   Essential hypertension 03/02/2014   Hyperlipidemia 03/02/2014   Obesity with serious comorbidity 01/16/2012   Lisa Briggs, PT, DPT, OCS, Crt. DN  Lisa Briggs 09/24/2020, 12:05 PM  [facility] [address] [place], [place], [address] Phone: [phone]   Fax:  [phone]  Name: Lisa Briggs MRN: [id number] Date of Birth: [birth year]

## 2020-09-24 NOTE — Patient Instructions (Signed)
Access Code: [email or web address] URL: [email or web address] Date: 09/24/2020 Prepared by: Rich Number  Exercises Supine Posterior Pelvic Tilt - 2 x daily - 7 x weekly - 1 sets - 10 reps - 3 hold Supine Quad Set - 2 x daily - 7 x weekly - 1 sets - 10 reps - 5 hold Supine Heel Slide - 2 x daily - 7 x weekly - 1 sets - 15 reps

## 2020-09-24 NOTE — Telephone Encounter (Signed)
P.A. DEXILANT completed on [employer]

## 2020-09-27 ENCOUNTER — Other Ambulatory Visit: Payer: Self-pay

## 2020-09-27 ENCOUNTER — Encounter: Payer: Self-pay | Admitting: Physical Therapy

## 2020-09-27 ENCOUNTER — Ambulatory Visit: Payer: PRIVATE HEALTH INSURANCE | Admitting: Physical Therapy

## 2020-09-27 DIAGNOSIS — R262 Difficulty in walking, not elsewhere classified: Secondary | ICD-10-CM | POA: Diagnosis not present

## 2020-09-27 DIAGNOSIS — M25561 Pain in right knee: Secondary | ICD-10-CM

## 2020-09-27 DIAGNOSIS — M6281 Muscle weakness (generalized): Secondary | ICD-10-CM | POA: Diagnosis not present

## 2020-09-27 DIAGNOSIS — M5441 Lumbago with sciatica, right side: Secondary | ICD-10-CM | POA: Diagnosis not present

## 2020-09-27 DIAGNOSIS — M25551 Pain in right hip: Secondary | ICD-10-CM

## 2020-09-27 DIAGNOSIS — M5442 Lumbago with sciatica, left side: Secondary | ICD-10-CM | POA: Diagnosis not present

## 2020-09-27 DIAGNOSIS — G8929 Other chronic pain: Secondary | ICD-10-CM | POA: Diagnosis not present

## 2020-09-27 NOTE — Patient Instructions (Signed)
Access Code: [email or web address] URL: [email or web address] Date: 09/27/2020 Prepared by: Hessie Diener  Exercises Supine Posterior Pelvic Tilt - 2 x daily - 7 x weekly - 1 sets - 10 reps - 3 hold Supine Quad Set - 2 x daily - 7 x weekly - 1 sets - 10 reps - 5 hold Supine Heel Slide - 2 x daily - 7 x weekly - 1 sets - 15 reps Seated Knee Flexion Extension AROM - 2 x daily - 7 x weekly - 3 sets - 15 reps Supine Lower Trunk Rotation - 2 x daily - 7 x weekly - 1 sets - 10 reps - 5 hold

## 2020-09-27 NOTE — Therapy (Addendum)
[place], [place], [address] Phone: [phone]   Fax:  [phone]  Physical Therapy Treatment  Patient Details  Name: Lisa Briggs MRN: [id number] Date of Birth: [birth year] Referring Provider (PT): Dene Gentry, MD   Encounter Date: 09/27/2020   PT End of Session - 09/27/20 1155     Visit Number 2    Number of Visits 16    Date for PT Re-Evaluation 11/19/20    Authorization Type [employer]et[employer] MCR    Authorization Time Period 6th visit FOTO - 10th Visit FOTO/Re-eval    PT Start Time 1146    PT Stop Time 1229    PT Time Calculation (min) 43 min             Past Medical History:  Diagnosis Date   Allergy    Anemia    iron therapy for years as of 10/12; normal Hgb 08/2013   Anxiety    Arthritis    Chest pain 04/05/2011   cardiac eval, normal treadmill stress test, Dr. 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 [name], [facility]   GERD (gastroesophageal reflux disease)    History of uterine fibroid    Hyperlipidemia    Hypertension    Paresthesia 09/2014   initially thought to be TIA, neurology consult in 12/2014 with other non TIA considerations.     Polyarthralgia    normal rheumatoid screen 01/2012   TIA (transient ischemic attack) 2015/16    Past Surgical History:  Procedure Laterality Date   COLONOSCOPY  01/2014   diverticulosis, othwerise normal - Dr. Owens Loffler   ESOPHAGOGASTRODUODENOSCOPY  2013   Dr. Benson Norway, gastritis   ESOPHAGOGASTRODUODENOSCOPY  [id number]   EUS N/A 04/16/2014   Procedure: UPPER ENDOSCOPIC ULTRASOUND (EUS) LINEAR;  Surgeon: Milus Banister, MD;  [facility];  Service: Endoscopy;  Laterality: N/A;   gall stone surgery     KNEE ARTHROSCOPY Left    LAPAROSCOPIC GASTRIC RESECTION N/A 06/09/2014   Procedure:  LAPAROSCOPIC GASTRIC MASS RESECTION;  Surgeon: [name], MD;  Location[facility];  Service: General;  Laterality: N/A;   LIPOMA EXCISION     forehead   LUMBAR LAMINECTOMY N/A 04/25/2019   Procedure: LEFT L2-3 MICRODISCECTOMY, BILATERAL L5-S1 PARTIAL HEMILAMINECTOMY;  Surgeon: Jessy Oto, MD;  Location: [facility];  Service: Orthopedics;  Laterality: N/A;   UPPER GASTROINTESTINAL ENDOSCOPY     UTERINE FIBROID SURGERY      There were no vitals filed for this visit.   Subjective Assessment - 09/27/20 1152     Subjective The knee still hurts.    Currently in Pain? Yes    Pain Score 5     Pain Location Knee    Pain Orientation Right    Pain Descriptors / Indicators Aching;Burning    Pain Type Acute pain;Chronic pain    Aggravating Factors  walking , standing, cleanIng    Pain Relieving Factors ice, heat, elevation    Pain Score 5    Pain Location Hip    Pain Orientation Right    Pain Descriptors / Indicators Sore;Shooting;Sharp    Pain Type Chronic pain    Aggravating Factors  walking, lifting, caryying, cleaning    Pain Relieving Factors ice and rest  PT Education - 09/27/20 1224     Education Details HEP, FOTO intake and predictions    Person(s) Educated Patient    Methods Explanation;Handout    Comprehension Verbalized understanding              PT Short Term Goals - 09/24/20 0956       PT SHORT TERM GOAL #1   Title The patient will be independent in an initial HEP.    Time 2    Period Weeks    Target Date 10/08/20               PT Long Term Goals - 09/24/20 0957       PT LONG TERM GOAL #1   Title The patient will have an improved FOTO score of  67% for functional activities.    Baseline 52%    Time 8    Period Weeks    Target Date 11/19/20      PT LONG TERM GOAL #2   Title The patient will be able to walk using cane for 30 with pain under or equal to 4/10.    Baseline 8/10  pain    Time 8    Period Weeks    Target Date 11/19/20      PT LONG TERM GOAL #3   Title The patinet will be able to perform a single leg stance for 10 seconds on each leg for safely with mobility.    Time 8    Period Weeks    Target Date 11/19/20                   Plan - 09/27/20 1222     Clinical Impression Statement Wynnie reports compliance with HEP. Reviewed HEP and she performed with min cues for correct pelvic tilit technique. Progressed with hip stretch and stabilization and updated HEP. 5 X STS 36.6 sec with UE use. She reported that she still felt sore after session. FOTO was reviewed with patient.    PT Next Visit Plan Asess balance, , stretches for hip/low back, quad/hip/core stabilization, manual therapy    PT Home Exercise Plan Access Code: [email or web address]             Patient will benefit from skilled therapeutic intervention in order to improve the following deficits and impairments:  Abnormal gait, Decreased range of motion, Difficulty walking, Decreased endurance, Decreased activity tolerance, Pain, Decreased balance, Decreased mobility, Decreased strength  Visit Diagnosis: Pain in right hip  Right knee pain, unspecified chronicity  Difficulty in walking, not elsewhere classified  Muscle weakness (generalized)     Problem List Patient Active Problem List   Diagnosis Date Noted   Prediabetes 02/19/2020   Menopause 11/25/2019   Cardiomyopathy ([place]) 11/19/2019   BMI 38.0-38.9,adult 11/19/2019   Rheumatoid arthritis involving multiple sites with positive rheumatoid factor ([place]) 11/19/2019   Primary osteoarthritis involving multiple joints 11/19/2019   Greater trochanteric bursitis, right 11/19/2019   Decreased activities of daily living (ADL) 11/19/2019   Gait disturbance 11/19/2019   Chronic gastritis without bleeding 11/19/2019   Use of cane as ambulatory aid 11/19/2019   Pain in left shoulder 10/02/2019   Lumbar disc herniation with  radiculopathy 04/25/2019    Class: Chronic   Spinal stenosis of lumbar region 04/25/2019    Class: Chronic   Status post lumbar laminectomy 04/25/2019   Neck pain 11/12/2018   Chronic pain of right knee 09/13/2018   Bruising 09/13/2018   SOB (shortness of breath)  08/30/2018   Chronic pain of left knee 07/08/2018   Chronic hip pain 07/08/2018   Edema 07/08/2018   Left foot pain 04/08/2018   Olecranon bursitis of left elbow 04/05/2018   Leg swelling 12/05/2017   Need for shingles vaccine 12/05/2017   Primary osteoarthritis of both hands 11/23/2017   Rheumatoid factor positive 11/23/2017   Primary osteoarthritis of both knees 11/23/2017   Primary osteoarthritis of both feet 11/23/2017   DDD (degenerative disc disease), lumbar 11/23/2017   Vaccine counseling 08/13/2017   Polyarthralgia 08/13/2017   Joint stiffness 08/13/2017   Sensitive skin 01/23/2017   Need for influenza vaccination 01/23/2017   Proteinuria 01/23/2017   History of gastrointestinal stromal tumor (GIST) 04/20/2016   History of TIA (transient ischemic attack) 04/20/2016   Screening for breast cancer 04/20/2016   Estrogen deficiency 04/20/2016   Chronic maxillary sinusitis 04/20/2016   Impaired fasting blood sugar 04/20/2016   Constipation 04/20/2016   History of fall 04/20/2016   Chronic radicular lumbar pain 01/27/2016   Encounter for health maintenance examination in adult 03/22/2015   Paresthesia 03/22/2015   Cognitive decline 03/22/2015   Screening for cervical cancer 03/22/2015   Vitamin D deficiency 03/22/2015   History of uterine leiomyoma 03/22/2015   Gastroesophageal reflux disease without esophagitis 03/02/2014   Chronic nausea 03/02/2014   Chronic abdominal pain 03/02/2014   Rhinitis, allergic 03/02/2014   Essential hypertension 03/02/2014   Hyperlipidemia 03/02/2014   Obesity with serious comorbidity 01/16/2012    Bethena Midget, PTA 09/28/2020, 3:47 PM  [facility] [address] [place], [place], [address] Phone: [phone]   Fax:  [phone]  Name: Lanyiah Brix MRN: [id number] Date of Birth: 05/14/1960

## 2020-09-28 NOTE — Progress Notes (Signed)
Office Visit Note  [name]             Date of Birth: [birth year]           MRN: [id number]             PCP: Carlena Hurl, PA-C Referring: Carlena Hurl, PA-C Visit Date: 10/12/2020 Occupation: @GUAROCC @  Subjective:  Right knee joint pain   History of Present Illness: Lisa Briggs is a 60 y.o. female with history of seropositive rheumatoid arthritis, osteoarthritis, DDD.  Patient is taking Plaquenil 200 mg 1 tablet by mouth twice daily.  She denies missing any doses of PLQ recently.  She denies any recent rheumatoid arthritis flares.  She states that she continues to have some pain and stiffness in her right third PIP joint.  She states that Dr. Louanne Skye referred her to hand therapy but she has not gone yet.  She states that she had a fall on 09/09/2020 and injured her right knee.  She was evaluated at urgent care and referred to sports medicine who recommended physical therapy.  She has been wearing a brace on her right knee and using a cane to assist with ambulation.  She will occasionally use her Rollator walker to improve her stability.  She denies any other joint pain or joint swelling at this time.   Activities of Daily Living:  Patient reports joint stiffness all day  Patient Denies nocturnal pain.  Difficulty dressing/grooming: Denies Difficulty climbing stairs: Denies Difficulty getting out of chair: Denies Difficulty using hands for taps, buttons, cutlery, and/or writing: Denies  Review of Systems  Constitutional:  Positive for fatigue.  HENT:  Positive for mouth dryness. Negative for mouth sores and nose dryness.   Eyes:  Negative for pain, visual disturbance and dryness.  Respiratory:  Negative for cough, hemoptysis, shortness of breath and difficulty breathing.   Cardiovascular:  Negative for chest pain, palpitations, hypertension and swelling in legs/feet.  Gastrointestinal:  Negative for blood in stool, constipation and diarrhea.  Endocrine: Negative  for increased urination.  Genitourinary:  Negative for painful urination.  Musculoskeletal:  Positive for joint pain, joint pain and morning stiffness. Negative for joint swelling, myalgias, muscle weakness, muscle tenderness and myalgias.  Skin:  Negative for color change, pallor, rash, hair loss, nodules/bumps, skin tightness, ulcers and sensitivity to sunlight.  Allergic/Immunologic: Negative for susceptible to infections.  Neurological:  Negative for dizziness, numbness, headaches and weakness.  Hematological:  Negative for swollen glands.  Psychiatric/Behavioral:  Negative for depressed mood and sleep disturbance. The patient is not nervous/anxious.    PMFS History:  Patient Active Problem List   Diagnosis Date Noted   Prediabetes 02/19/2020   Menopause 11/25/2019   Cardiomyopathy ([place]) 11/19/2019   BMI 38.0-38.9,adult 11/19/2019   Rheumatoid arthritis involving multiple sites with positive rheumatoid factor ([place]) 11/19/2019   Primary osteoarthritis involving multiple joints 11/19/2019   Greater trochanteric bursitis, right 11/19/2019   Decreased activities of daily living (ADL) 11/19/2019   Gait disturbance 11/19/2019   Chronic gastritis without bleeding 11/19/2019   Use of cane as ambulatory aid 11/19/2019   Pain in left shoulder 10/02/2019   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    Class: Chronic   Spinal stenosis of lumbar region 04/25/2019    Class: Chronic   Status post lumbar laminectomy 04/25/2019   Neck pain 11/12/2018   Chronic pain of right knee 09/13/2018   Bruising 09/13/2018   SOB (shortness of breath) 08/30/2018   Chronic pain  of left knee 07/08/2018   Chronic hip pain 07/08/2018   Edema 07/08/2018   Left foot pain 04/08/2018   Olecranon bursitis of left elbow 04/05/2018   Leg swelling 12/05/2017   Need for shingles vaccine 12/05/2017   Primary osteoarthritis of both hands 11/23/2017   Rheumatoid factor positive 11/23/2017   Primary  osteoarthritis of both knees 11/23/2017   Primary osteoarthritis of both feet 11/23/2017   DDD (degenerative disc disease), lumbar 11/23/2017   Vaccine counseling 08/13/2017   Polyarthralgia 08/13/2017   Joint stiffness 08/13/2017   Sensitive skin 01/23/2017   Need for influenza vaccination 01/23/2017   Proteinuria 01/23/2017   History of gastrointestinal stromal tumor (GIST) 04/20/2016   History of TIA (transient ischemic attack) 04/20/2016   Screening for breast cancer 04/20/2016   Estrogen deficiency 04/20/2016   Chronic maxillary sinusitis 04/20/2016   Impaired fasting blood sugar 04/20/2016   Constipation 04/20/2016   History of fall 04/20/2016   Chronic radicular lumbar pain 01/27/2016   Encounter for health maintenance examination in adult 03/22/2015   Paresthesia 03/22/2015   Cognitive decline 03/22/2015   Screening for cervical cancer 03/22/2015   Vitamin D deficiency 03/22/2015   History of uterine leiomyoma 03/22/2015   Gastroesophageal reflux disease without esophagitis 03/02/2014   Chronic nausea 03/02/2014   Chronic abdominal pain 03/02/2014   Rhinitis, allergic 03/02/2014   Essential hypertension 03/02/2014   Hyperlipidemia 03/02/2014   Obesity with serious comorbidity 01/16/2012    Past Medical History:  Diagnosis Date   Allergy    Anemia    iron therapy for years as of 10/12; normal Hgb 08/2013   Anxiety    Arthritis    Chest pain 04/05/2011   cardiac eval, normal treadmill stress test, Dr. Tollie Eth   Chronic back pain    Constipation    Farsightedness    wears glasses, Eye care center   Gastrointestinal stromal tumor (GIST) ([place]) 06/2014   Dr. [name], [facility]   GERD (gastroesophageal reflux disease)    History of uterine fibroid    Hyperlipidemia    Hypertension    Paresthesia 09/2014   initially thought to be TIA, neurology consult in 12/2014 with other non TIA considerations.     Polyarthralgia    normal rheumatoid  screen 01/2012   TIA (transient ischemic attack) 2015/16    Family History  Problem Relation Age of Onset   Hypertension Mother    Arthritis Mother    GER disease Mother    Glaucoma Mother    Breast cancer Mother    Stroke Father    Breast cancer Sister        breast cancer dx late 33s   Hypertension Sister    Colon cancer Brother 44   Diabetes Brother    Diabetes Maternal Aunt    Heart disease Maternal Aunt    Heart disease Maternal Grandmother    Heart disease Maternal Grandfather    Heart disease Maternal Uncle    Stroke Maternal Uncle    Leukemia Sister    Hypertension Sister    Healthy Son    Colon polyps Neg Hx    Esophageal cancer Neg Hx    Stomach cancer Neg Hx    Rectal cancer Neg Hx    Past Surgical History:  Procedure Laterality Date   COLONOSCOPY  01/2014   diverticulosis, othwerise normal - Dr. Owens Loffler   ESOPHAGOGASTRODUODENOSCOPY  2013   Dr. Benson [place], gastritis   ESOPHAGOGASTRODUODENOSCOPY  119417   EUS  N/A 04/16/2014   Procedure: UPPER ENDOSCOPIC ULTRASOUND (EUS) LINEAR;  Surgeon: Milus Banister, MD;  Location: [facility];  Service: Endoscopy;  Laterality: N/A;   gall stone surgery     KNEE ARTHROSCOPY Left    LAPAROSCOPIC GASTRIC RESECTION N/A 06/09/2014   Procedure: LAPAROSCOPIC GASTRIC MASS RESECTION;  Surgeon: [name], MD;  Location: [facility];  Service: General;  Laterality: N/A;   LIPOMA EXCISION     forehead   LUMBAR LAMINECTOMY N/A 04/25/2019   Procedure: LEFT L2-3 MICRODISCECTOMY, BILATERAL L5-S1 PARTIAL HEMILAMINECTOMY;  Surgeon: Jessy Oto, MD;  Location: [facility];  Service: Orthopedics;  Laterality: N/A;   UPPER GASTROINTESTINAL ENDOSCOPY     UTERINE FIBROID SURGERY     Social History   Social History Narrative   Married, has 1 son in [place] and some grandchildren.  Glass blower/designer.  Active on job.  Does stretching and exercises daily as per physical therapy.  Works 12 hours daily.     Immunization History  Administered  Date(s) Administered   Influenza,inj,Quad PF,6+ Mos 11/24/2013, 01/18/2015, 01/23/2017, 12/05/2017, 12/17/2018, 11/19/2019   PFIZER Comirnaty(Gray Top)Covid-19 Tri-Sucrose Vaccine 07/12/2020   PFIZER(Purple Top)SARS-COV-2 Vaccination 06/16/2019, 07/08/2019, 01/15/2020   Pneumococcal Polysaccharide-23 05/21/2015   Tdap 01/17/2011   Zoster Recombinat (Shingrix) 10/05/2017, 12/07/2017     Objective: Vital Signs: BP 116/78 (BP Location: Left Arm, Patient Position: Sitting, Cuff Size: Large)   Pulse 70   Resp 14   Ht 5\' 9"  (1.753 m)   Wt 258 lb 12.8 oz (117.4 kg)   LMP 02/23/2015 (LMP Unknown)   BMI 38.22 kg/m    Physical Exam Vitals and nursing note reviewed.  Constitutional:      Appearance: She is well-developed.  HENT:     Head: Normocephalic and atraumatic.  Eyes:     Conjunctiva/sclera: Conjunctivae normal.  Pulmonary:     Effort: Pulmonary effort is normal.  Abdominal:     Palpations: Abdomen is soft.  Musculoskeletal:     Cervical back: Normal range of motion.  Skin:    General: Skin is warm and dry.     Capillary Refill: Capillary refill takes less than 2 seconds.  Neurological:     Mental Status: She is alert and oriented to person, place, and time.  Psychiatric:        Behavior: Behavior normal.     Musculoskeletal Exam: C-spine has good ROM with no discomfort at this time. No midline spinal tenderness or SI joint tenderness. Shoulder joints, elbow joints, wrist joints, MCPs, PIPs, and DIPs have good range of motion with no synovitis.Thickening of right 3rd PIP.  PIP and DIP thickening consistent with osteoarthritis of both hands.  Complete fist formation bilaterally.  Tenderness palpation over bilateral trochanteric bursa, right greater than left.  Painful range of motion of the right knee.  Ankle joints have good range of motion with no tenderness or joint swelling.  CDAI Exam: CDAI Score: 3  Patient Global: 5 mm; Provider Global: 5 mm Swollen: 0 ; Tender: 2   Joint Exam 10/12/2020      Right  Left  PIP 3   Tender     Knee   Tender        Investigation: No additional findings.  Imaging: No results found.  Recent Labs: Lab Results  Component Value Date   WBC 6.2 10/11/2020   HGB 12.7 10/11/2020   PLT 358 10/11/2020   NA 144 08/20/2020   K 4.2 08/20/2020   CL 107 (H) 08/20/2020  CO2 23 08/20/2020   GLUCOSE 87 08/20/2020   BUN 13 08/20/2020   CREATININE 0.86 08/20/2020   BILITOT 0.3 05/11/2020   ALKPHOS 134 (H) 11/19/2019   AST 13 05/11/2020   ALT 12 05/11/2020   PROT 6.8 05/11/2020   ALBUMIN 4.3 11/19/2019   CALCIUM 9.4 08/20/2020   GFRAA 115 05/11/2020    Speciality Comments: PLQ Eye Exam: 08/05/2020 WNL @ [facility] Follow up in 1 year  Procedures:  No procedures performed Allergies: Kiwi extract, Mucinex dm [dm-guaifenesin er], and Peach [prunus persica]   Assessment / Plan:     Visit Diagnoses: Rheumatoid arthritis involving multiple sites with positive rheumatoid factor ([place]) - +RF, +CCP: She has no synovitis on examination today.  Overall she has clinically been doing well taking Plaquenil 200 mg 1 tablet by mouth twice daily.  She has some tenderness and thickening over the right third PIP joint but no active inflammation was noted.  A referral to hand therapy was placed by Dr. Louanne Skye so she plans on scheduling an appointment.  On 09/09/2020 she fell and injured her right knee.  She was evaluated at urgent care and referred to sports medicine.  She has been wearing a right knee brace and was referred to physical therapy.  She has been using a cane or a Rollator walker to assist with ambulation.  She is not experiencing any other joint pain or inflammation at this time.  She will remain on Plaquenil as prescribed.  She will follow-up in the office in 5 months.  High risk medication use - Plaquenil 200 mg 1 tablet by mouth twice daily. CBC and CMP updated yesterday on 10/11/20. PLQ Eye Exam: 08/05/2020 WNL @ [facility]  Follow up in 1 year.   Primary osteoarthritis of both hands: She has PIP and DIP thickening consistent with osteoarthritis of both hands.  Tenderness palpation over the right third PIP noted.  Complete fist formation bilaterally.  She was referred to hand therapy by Dr. Donavan Burnet so she was encouraged to schedule an appointment as recommended.  Discussed the importance of joint protection and muscle strengthening.  She was given a handout of hand exercises to perform.  Primary osteoarthritis of both knees: She presents today with increased discomfort in the right knee joint after a fall on 09/09/20.  She was evaluated at urgent care, and she had an x-ray of the right knee on 09/09/2020 which did not reveal any acute abnormalities.  She was referred to sports medicine for further evaluation and management.  She has been wearing a right knee brace and was referred to physical therapy.  She declined a cortisone injection at this time.  Primary osteoarthritis of both feet: She is not experiencing any discomfort in her feet at this time.  She has good range of motion of both ankle joints with no tenderness or joint swelling.  Trochanteric bursitis of both hips: She has tenderness palpation over bilateral trochanteric bursa, right greater than left.  She was recently referred to physical therapy by Dr. Ned Clines. Discussed the importance of performing stretching exercises daily.  DDD (degenerative disc disease), lumbar: Followed by Dr. Louanne Skye.   Other medical conditions are listed as follows:   Septic olecranon bursitis of left elbow - Resolved.  Closed avulsion fracture of left ankle with routine healing, subsequent encounter  Gastroesophageal reflux disease without esophagitis  History of TIA (transient ischemic attack)  Essential hypertension  Vitamin D deficiency  History of hyperlipidemia  Orders: No orders of the  defined types were placed in this encounter.  No orders of the defined types were  placed in this encounter.    Follow-Up Instructions: Return in about 5 months (around 03/14/2021) for Rheumatoid arthritis, Osteoarthritis, DDD.   Ofilia Neas, PA-C  Note - This record has been created using Dragon software.  Chart creation errors have been sought, but may not always  have been located. Such creation errors do not reflect on  the standard of medical care.

## 2020-09-29 ENCOUNTER — Other Ambulatory Visit: Payer: Self-pay

## 2020-09-29 ENCOUNTER — Encounter: Payer: Self-pay | Admitting: Physical Therapy

## 2020-09-29 ENCOUNTER — Ambulatory Visit: Payer: PRIVATE HEALTH INSURANCE | Admitting: Physical Therapy

## 2020-09-29 DIAGNOSIS — M6281 Muscle weakness (generalized): Secondary | ICD-10-CM | POA: Diagnosis not present

## 2020-09-29 DIAGNOSIS — M25551 Pain in right hip: Secondary | ICD-10-CM

## 2020-09-29 DIAGNOSIS — G8929 Other chronic pain: Secondary | ICD-10-CM | POA: Diagnosis not present

## 2020-09-29 DIAGNOSIS — R262 Difficulty in walking, not elsewhere classified: Secondary | ICD-10-CM | POA: Diagnosis not present

## 2020-09-29 DIAGNOSIS — M25561 Pain in right knee: Secondary | ICD-10-CM

## 2020-09-29 DIAGNOSIS — M5441 Lumbago with sciatica, right side: Secondary | ICD-10-CM | POA: Diagnosis not present

## 2020-09-29 DIAGNOSIS — M5442 Lumbago with sciatica, left side: Secondary | ICD-10-CM | POA: Diagnosis not present

## 2020-09-29 NOTE — Therapy (Signed)
[place], [place], [address] Phone: [phone]   Fax:  [phone]  Physical Therapy Treatment  Patient Details  Name: Lisa Briggs MRN: [id number] Date of Birth: 23-Jan-1961 Referring Provider (PT): Lisa Briggs     Encounter Date: 09/29/2020   PT End of Session - 09/29/20 0832     Visit Number 3    Number of Visits 9    Date for PT Re-Evaluation 11/19/20    Authorization Type [employer] MCR    Authorization Time Period 6th visit FOTO - 10th Visit FOTO/Re-eval    Progress Note Due on Visit 10    PT Start Time 0830    PT Stop Time 0923    PT Time Calculation (min) 53 min    Activity Tolerance Patient tolerated treatment well    Behavior During Therapy [facility] for tasks assessed/performed             Past Medical History:  Diagnosis Date   Allergy    Anemia    iron therapy for years as of 10/12; normal Hgb 08/2013   Anxiety    Arthritis    Chest pain 04/05/2011   cardiac eval, normal treadmill stress test, Dr. Tollie Briggs   Chronic back pain    Constipation    Farsightedness    wears glasses, Eye care center   Gastrointestinal stromal tumor (GIST) ([place]) 06/2014   Dr. Ralene Briggs, [facility]   GERD (gastroesophageal reflux disease)    History of uterine fibroid    Hyperlipidemia    Hypertension    Paresthesia 09/2014   initially thought to be TIA, neurology consult in 12/2014 with other non TIA considerations.     Polyarthralgia    normal rheumatoid screen 01/2012   TIA (transient ischemic attack) 2015/16    Past Surgical History:  Procedure Laterality Date   COLONOSCOPY  01/2014   diverticulosis, othwerise normal - Dr. Owens Briggs   ESOPHAGOGASTRODUODENOSCOPY  2013   Dr. Benson Briggs, gastritis   ESOPHAGOGASTRODUODENOSCOPY  [id number]   EUS N/A 04/16/2014   Procedure: UPPER ENDOSCOPIC ULTRASOUND (EUS) LINEAR;  Surgeon: Lisa Banister, Briggs;  [facility];   Service: Endoscopy;  Laterality: N/A;   gall stone surgery     KNEE ARTHROSCOPY Left    LAPAROSCOPIC GASTRIC RESECTION N/A 06/09/2014   Procedure: LAPAROSCOPIC GASTRIC MASS RESECTION;  Surgeon: Lisa Briggs, Briggs;  Location[facility];  Service: General;  Laterality: N/A;   LIPOMA EXCISION     forehead   LUMBAR LAMINECTOMY N/A 04/25/2019   Procedure: LEFT L2-3 MICRODISCECTOMY, BILATERAL L5-S1 PARTIAL HEMILAMINECTOMY;  Surgeon: Lisa Oto, Briggs;  Location: [facility];  Service: Orthopedics;  Laterality: N/A;   UPPER GASTROINTESTINAL ENDOSCOPY     UTERINE FIBROID SURGERY      There were no vitals filed for this visit.   Subjective Assessment - 09/29/20 0839     Subjective Pt reports that her knee still bothers her, expecially when she is up walking for an extended period of time.  Right now her R knee pain is 0/10, pt reports that the pain can reach 10/10 with activity.                               [facility] Adult PT Treatment/Exercise - 09/29/20 0001       Lumbar Exercises: Aerobic   Nustep L6 x 5 min      Lumbar  Exercises: Supine   Clam Limitations GTB - 20x    Bridge Limitations partial ROM x10      Knee/Hip Exercises: Supine   Quad Sets 10 reps;2 sets    Quad Sets Limitations towel roll 5'' hold    Short Arc Quad Sets Limitations 2#, 5'' hold, 2x10 ea    Straight Leg Raises Limitations just heel lift - 3x10 ea      Modalities   Modalities Cryotherapy      Cryotherapy   Number Minutes Cryotherapy 10 Minutes    Cryotherapy Location Knee    Type of Cryotherapy Ice pack      Manual Therapy   Manual therapy comments R knee distraction in sitting with gait belt                      PT Short Term Goals - 09/24/20 0956       PT SHORT TERM GOAL #1   Title The patient will be independent in an initial HEP.    Time 2    Period Weeks    Target Date 10/08/20               PT Long Term Goals - 09/24/20 0957       PT LONG TERM GOAL #1    Title The patient will have an improved FOTO score of  67% for functional activities.    Baseline 52%    Time 8    Period Weeks    Target Date 11/19/20      PT LONG TERM GOAL #2   Title The patient will be able to walk using cane for 30 with pain under or equal to 4/10.    Baseline 8/10 pain    Time 8    Period Weeks    Target Date 11/19/20      PT LONG TERM GOAL #3   Title The patinet will be able to perform a single leg stance for 10 seconds on each leg for safely with mobility.    Time 8    Period Weeks    Target Date 11/19/20                   Plan - 09/29/20 0858     Clinical Impression Statement Pt reports no increase in baseline pain following therapy  HEP was not updated as current program meets patient's needs    Overall, Lisa Briggs is progressing fair with therapy.  Today we concentrated on lower extremity strengthening and quad strengthening.  Pt reports that MT is relieving.  She fatigues rapidly with quad centered exercise, this should be focus as far as the knee goes.  Pt will continue to benefit from skilled physical therapy to address remaining deficits and achieve listed goals.  Continue per POC.    PT Next Visit Plan Asess balance, , stretches for hip/low back, quad/hip/core stabilization, manual therapy    PT Home Exercise Plan Access Code: [email or web address]             Patient will benefit from skilled therapeutic intervention in order to improve the following deficits and impairments:  Abnormal gait, Decreased range of motion, Difficulty walking, Decreased endurance, Decreased activity tolerance, Pain, Decreased balance, Decreased mobility, Decreased strength  Visit Diagnosis: Pain in right hip  Right knee pain, unspecified chronicity  Difficulty in walking, not elsewhere classified  Chronic bilateral low back pain with bilateral sciatica     Problem List Patient Active Problem List  Diagnosis Date Noted   Prediabetes 02/19/2020    Menopause 11/25/2019   Cardiomyopathy ([place]) 11/19/2019   BMI 38.0-38.9,adult 11/19/2019   Rheumatoid arthritis involving multiple sites with positive rheumatoid factor ([place]) 11/19/2019   Primary osteoarthritis involving multiple joints 11/19/2019   Greater trochanteric bursitis, right 11/19/2019   Decreased activities of daily living (ADL) 11/19/2019   Gait disturbance 11/19/2019   Chronic gastritis without bleeding 11/19/2019   Use of cane as ambulatory aid 11/19/2019   Pain in left shoulder 10/02/2019   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    Class: Chronic   Spinal stenosis of lumbar region 04/25/2019    Class: Chronic   Status post lumbar laminectomy 04/25/2019   Neck pain 11/12/2018   Chronic pain of right knee 09/13/2018   Bruising 09/13/2018   SOB (shortness of breath) 08/30/2018   Chronic pain of left knee 07/08/2018   Chronic hip pain 07/08/2018   Edema 07/08/2018   Left foot pain 04/08/2018   Olecranon bursitis of left elbow 04/05/2018   Leg swelling 12/05/2017   Need for shingles vaccine 12/05/2017   Primary osteoarthritis of both hands 11/23/2017   Rheumatoid factor positive 11/23/2017   Primary osteoarthritis of both knees 11/23/2017   Primary osteoarthritis of both feet 11/23/2017   DDD (degenerative disc disease), lumbar 11/23/2017   Vaccine counseling 08/13/2017   Polyarthralgia 08/13/2017   Joint stiffness 08/13/2017   Sensitive skin 01/23/2017   Need for influenza vaccination 01/23/2017   Proteinuria 01/23/2017   History of gastrointestinal stromal tumor (GIST) 04/20/2016   History of TIA (transient ischemic attack) 04/20/2016   Screening for breast cancer 04/20/2016   Estrogen deficiency 04/20/2016   Chronic maxillary sinusitis 04/20/2016   Impaired fasting blood sugar 04/20/2016   Constipation 04/20/2016   History of fall 04/20/2016   Chronic radicular lumbar pain 01/27/2016   Encounter for health maintenance examination in adult  03/22/2015   Paresthesia 03/22/2015   Cognitive decline 03/22/2015   Screening for cervical cancer 03/22/2015   Vitamin D deficiency 03/22/2015   History of uterine leiomyoma 03/22/2015   Gastroesophageal reflux disease without esophagitis 03/02/2014   Chronic nausea 03/02/2014   Chronic abdominal pain 03/02/2014   Rhinitis, allergic 03/02/2014   Essential hypertension 03/02/2014   Hyperlipidemia 03/02/2014   Obesity with serious comorbidity 01/16/2012    Shearon Balo PT, DPT 09/29/20 9:14 AM  [facility] [address] [place], [place], [address] Phone: [phone]   Fax:  [phone]  Name: Lisa Briggs MRN: [id number] Date of Birth: Sep 11, 1960

## 2020-10-01 NOTE — Telephone Encounter (Signed)
P.A. covers Brand only, called pharmacy went thru.  Called pt informed

## 2020-10-05 ENCOUNTER — Ambulatory Visit: Payer: PRIVATE HEALTH INSURANCE | Admitting: Cardiology

## 2020-10-06 ENCOUNTER — Ambulatory Visit: Payer: PRIVATE HEALTH INSURANCE | Admitting: Physical Therapy

## 2020-10-11 ENCOUNTER — Other Ambulatory Visit: Payer: Self-pay | Admitting: *Deleted

## 2020-10-11 ENCOUNTER — Ambulatory Visit: Payer: PRIVATE HEALTH INSURANCE | Attending: Family Medicine

## 2020-10-11 ENCOUNTER — Telehealth: Payer: Self-pay

## 2020-10-11 DIAGNOSIS — Z79899 Other long term (current) drug therapy: Secondary | ICD-10-CM | POA: Diagnosis not present

## 2020-10-11 DIAGNOSIS — G8929 Other chronic pain: Secondary | ICD-10-CM | POA: Insufficient documentation

## 2020-10-11 DIAGNOSIS — M5441 Lumbago with sciatica, right side: Secondary | ICD-10-CM | POA: Insufficient documentation

## 2020-10-11 DIAGNOSIS — R262 Difficulty in walking, not elsewhere classified: Secondary | ICD-10-CM | POA: Insufficient documentation

## 2020-10-11 DIAGNOSIS — M25551 Pain in right hip: Secondary | ICD-10-CM | POA: Insufficient documentation

## 2020-10-11 DIAGNOSIS — M5442 Lumbago with sciatica, left side: Secondary | ICD-10-CM | POA: Insufficient documentation

## 2020-10-11 DIAGNOSIS — M25561 Pain in right knee: Secondary | ICD-10-CM | POA: Insufficient documentation

## 2020-10-11 NOTE — Telephone Encounter (Signed)
Lisa Briggs did not show to her appointment today.  I called her and was able to speak with her.  She reports that she did not get an reminder call.  Advised her of her next appointment on 10/13/20 at 10:15am.  Rich Number, PT, DPT, OCS, Crt. DN

## 2020-10-12 ENCOUNTER — Ambulatory Visit (INDEPENDENT_AMBULATORY_CARE_PROVIDER_SITE_OTHER): Payer: Medicare HMO | Admitting: Physician Assistant

## 2020-10-12 ENCOUNTER — Encounter: Payer: Self-pay | Admitting: Physician Assistant

## 2020-10-12 ENCOUNTER — Other Ambulatory Visit: Payer: Self-pay

## 2020-10-12 VITALS — BP 116/78 | HR 70 | Resp 14 | Ht 69.0 in | Wt 258.8 lb

## 2020-10-12 DIAGNOSIS — S82892D Other fracture of left lower leg, subsequent encounter for closed fracture with routine healing: Secondary | ICD-10-CM

## 2020-10-12 DIAGNOSIS — M19072 Primary osteoarthritis, left ankle and foot: Secondary | ICD-10-CM

## 2020-10-12 DIAGNOSIS — M7061 Trochanteric bursitis, right hip: Secondary | ICD-10-CM

## 2020-10-12 DIAGNOSIS — M71122 Other infective bursitis, left elbow: Secondary | ICD-10-CM | POA: Diagnosis not present

## 2020-10-12 DIAGNOSIS — Z8639 Personal history of other endocrine, nutritional and metabolic disease: Secondary | ICD-10-CM

## 2020-10-12 DIAGNOSIS — M19041 Primary osteoarthritis, right hand: Secondary | ICD-10-CM

## 2020-10-12 DIAGNOSIS — M170 Bilateral primary osteoarthritis of knee: Secondary | ICD-10-CM

## 2020-10-12 DIAGNOSIS — Z79899 Other long term (current) drug therapy: Secondary | ICD-10-CM

## 2020-10-12 DIAGNOSIS — M0579 Rheumatoid arthritis with rheumatoid factor of multiple sites without organ or systems involvement: Secondary | ICD-10-CM | POA: Diagnosis not present

## 2020-10-12 DIAGNOSIS — E559 Vitamin D deficiency, unspecified: Secondary | ICD-10-CM

## 2020-10-12 DIAGNOSIS — I10 Essential (primary) hypertension: Secondary | ICD-10-CM | POA: Diagnosis not present

## 2020-10-12 DIAGNOSIS — K219 Gastro-esophageal reflux disease without esophagitis: Secondary | ICD-10-CM | POA: Diagnosis not present

## 2020-10-12 DIAGNOSIS — M5136 Other intervertebral disc degeneration, lumbar region: Secondary | ICD-10-CM

## 2020-10-12 DIAGNOSIS — M19042 Primary osteoarthritis, left hand: Secondary | ICD-10-CM

## 2020-10-12 DIAGNOSIS — M19071 Primary osteoarthritis, right ankle and foot: Secondary | ICD-10-CM | POA: Diagnosis not present

## 2020-10-12 DIAGNOSIS — M7062 Trochanteric bursitis, left hip: Secondary | ICD-10-CM

## 2020-10-12 DIAGNOSIS — Z8673 Personal history of transient ischemic attack (TIA), and cerebral infarction without residual deficits: Secondary | ICD-10-CM | POA: Diagnosis not present

## 2020-10-12 LAB — CBC WITH DIFFERENTIAL/PLATELET
Absolute Monocytes: 341 cells/uL (ref 200–950)
Basophils Absolute: 31 cells/uL (ref 0–200)
Basophils Relative: 0.5 %
Eosinophils Absolute: 118 cells/uL (ref 15–500)
Eosinophils Relative: 1.9 %
HCT: 40.6 % (ref 35.0–45.0)
Hemoglobin: 12.7 g/dL (ref 11.7–15.5)
Lymphs Abs: 2399 cells/uL (ref 850–3900)
MCH: 27 pg (ref 27.0–33.0)
MCHC: 31.3 g/dL — ABNORMAL LOW (ref 32.0–36.0)
MCV: 86.2 fL (ref 80.0–100.0)
MPV: 9.9 fL (ref 7.5–12.5)
Monocytes Relative: 5.5 %
Neutro Abs: 3311 cells/uL (ref 1500–7800)
Neutrophils Relative %: 53.4 %
Platelets: 358 10*3/uL (ref 140–400)
RBC: 4.71 10*6/uL (ref 3.80–5.10)
RDW: 12.5 % (ref 11.0–15.0)
Total Lymphocyte: 38.7 %
WBC: 6.2 10*3/uL (ref 3.8–10.8)

## 2020-10-12 LAB — COMPLETE METABOLIC PANEL WITH GFR
AG Ratio: 1.5 (calc) (ref 1.0–2.5)
ALT: 12 U/L (ref 6–29)
AST: 14 U/L (ref 10–35)
Albumin: 3.8 g/dL (ref 3.6–5.1)
Alkaline phosphatase (APISO): 124 U/L (ref 37–153)
BUN: 11 mg/dL (ref 7–25)
CO2: 31 mmol/L (ref 20–32)
Calcium: 9 mg/dL (ref 8.6–10.4)
Chloride: 105 mmol/L (ref 98–110)
Creat: 0.74 mg/dL (ref 0.50–1.03)
Globulin: 2.5 g/dL (calc) (ref 1.9–3.7)
Glucose, Bld: 96 mg/dL (ref 65–99)
Potassium: 4.3 mmol/L (ref 3.5–5.3)
Sodium: 141 mmol/L (ref 135–146)
Total Bilirubin: 0.4 mg/dL (ref 0.2–1.2)
Total Protein: 6.3 g/dL (ref 6.1–8.1)
eGFR: 94 mL/min/{1.73_m2} (ref >=60)

## 2020-10-12 NOTE — Patient Instructions (Signed)
Hand Exercises Hand exercises can be helpful for almost anyone. These exercises can strengthen the hands, improve flexibility and movement, and increase blood flow to the hands. These results can make work and daily tasks easier. Hand exercises can be especially helpful for people who have joint pain from arthritis or have nerve damage from overuse (carpal tunnel syndrome). These exercises can also help people who have injured a hand. Exercises Most of these hand exercises are gentle stretching and motion exercises. It is usually safe to do them often throughout the day. Warming up your hands before exercise may help to reduce stiffness. You can do this with gentle massage orby placing your hands in warm water for 10-15 minutes. It is normal to feel some stretching, pulling, tightness, or mild discomfort as you begin new exercises. This will gradually improve. Stop an exercise right away if you feel sudden, severe pain or your pain gets worse. Ask your healthcare provider which exercises are best for you. Knuckle bend or "claw" fist Stand or sit with your arm, hand, and all five fingers pointed straight up. Make sure to keep your wrist straight during the exercise. Gently bend your fingers down toward your palm until the tips of your fingers are touching the top of your palm. Keep your big knuckle straight and just bend the small knuckles in your fingers. Hold this position for __________ seconds. Straighten (extend) your fingers back to the starting position. Repeat this exercise 5-10 times with each hand. Full finger fist Stand or sit with your arm, hand, and all five fingers pointed straight up. Make sure to keep your wrist straight during the exercise. Gently bend your fingers into your palm until the tips of your fingers are touching the middle of your palm. Hold this position for __________ seconds. Extend your fingers back to the starting position, stretching every joint fully. Repeat this  exercise 5-10 times with each hand. Straight fist Stand or sit with your arm, hand, and all five fingers pointed straight up. Make sure to keep your wrist straight during the exercise. Gently bend your fingers at the big knuckle, where your fingers meet your hand, and the middle knuckle. Keep the knuckle at the tips of your fingers straight and try to touch the bottom of your palm. Hold this position for __________ seconds. Extend your fingers back to the starting position, stretching every joint fully. Repeat this exercise 5-10 times with each hand. Tabletop Stand or sit with your arm, hand, and all five fingers pointed straight up. Make sure to keep your wrist straight during the exercise. Gently bend your fingers at the big knuckle, where your fingers meet your hand, as far down as you can while keeping the small knuckles in your fingers straight. Think of forming a tabletop with your fingers. Hold this position for __________ seconds. Extend your fingers back to the starting position, stretching every joint fully. Repeat this exercise 5-10 times with each hand. Finger spread Place your hand flat on a table with your palm facing down. Make sure your wrist stays straight as you do this exercise. Spread your fingers and thumb apart from each other as far as you can until you feel a gentle stretch. Hold this position for __________ seconds. Bring your fingers and thumb tight together again. Hold this position for __________ seconds. Repeat this exercise 5-10 times with each hand. Making circles Stand or sit with your arm, hand, and all five fingers pointed straight up. Make sure to keep your   wrist straight during the exercise. Make a circle by touching the tip of your thumb to the tip of your index finger. Hold for __________ seconds. Then open your hand wide. Repeat this motion with your thumb and each finger on your hand. Repeat this exercise 5-10 times with each hand. Thumb motion Sit  with your forearm resting on a table and your wrist straight. Your thumb should be facing up toward the ceiling. Keep your fingers relaxed as you move your thumb. Lift your thumb up as high as you can toward the ceiling. Hold for __________ seconds. Bend your thumb across your palm as far as you can, reaching the tip of your thumb for the small finger (pinkie) side of your palm. Hold for __________ seconds. Repeat this exercise 5-10 times with each hand. Grip strengthening  Hold a stress ball or other soft ball in the middle of your hand. Slowly increase the pressure, squeezing the ball as much as you can without causing pain. Think of bringing the tips of your fingers into the middle of your palm. All of your finger joints should bend when doing this exercise. Hold your squeeze for __________ seconds, then relax. Repeat this exercise 5-10 times with each hand. Contact a health care provider if: Your hand pain or discomfort gets much worse when you do an exercise. Your hand pain or discomfort does not improve within 2 hours after you exercise. If you have any of these problems, stop doing these exercises right away. Do not do them again unless your health care provider says that you can. Get help right away if: You develop sudden, severe hand pain or swelling. If this happens, stop doing these exercises right away. Do not do them again unless your health care provider says that you can. This information is not intended to replace advice given to you by your health care provider. Make sure you discuss any questions you have with your healthcare provider. Document Revised: 07/11/2018 Document Reviewed: 03/21/2018 Elsevier Patient Education  2022 [employer]  

## 2020-10-13 ENCOUNTER — Ambulatory Visit: Payer: PRIVATE HEALTH INSURANCE

## 2020-10-13 DIAGNOSIS — R262 Difficulty in walking, not elsewhere classified: Secondary | ICD-10-CM

## 2020-10-13 DIAGNOSIS — M5441 Lumbago with sciatica, right side: Secondary | ICD-10-CM | POA: Diagnosis present

## 2020-10-13 DIAGNOSIS — G8929 Other chronic pain: Secondary | ICD-10-CM | POA: Diagnosis present

## 2020-10-13 DIAGNOSIS — M25561 Pain in right knee: Secondary | ICD-10-CM

## 2020-10-13 DIAGNOSIS — M25551 Pain in right hip: Secondary | ICD-10-CM

## 2020-10-13 DIAGNOSIS — M5442 Lumbago with sciatica, left side: Secondary | ICD-10-CM | POA: Diagnosis present

## 2020-10-13 NOTE — Therapy (Signed)
[place], [place], [address] Phone: [phone]   Fax:  [phone]  Physical Therapy Treatment  Patient Details  Name: Lisa Briggs MRN: [id number] Date of Birth: 10/22/1960 Referring Provider (PT): Dene Gentry, MD   Encounter Date: 10/13/2020   PT End of Session - 10/13/20 1023     Visit Number 4    Number of Visits 9    Date for PT Re-Evaluation 11/19/20    Authorization Type [employer] MCR    Authorization Time Period 6th visit FOTO - 10th Visit FOTO/Re-eval    PT Start Time 1016    PT Stop Time 1059    PT Time Calculation (min) 43 min             Past Medical History:  Diagnosis Date   Allergy    Anemia    iron therapy for years as of 10/12; normal Hgb 08/2013   Anxiety    Arthritis    Chest pain 04/05/2011   cardiac eval, normal treadmill stress test, Dr. 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 [name], [facility]   GERD (gastroesophageal reflux disease)    History of uterine fibroid    Hyperlipidemia    Hypertension    Paresthesia 09/2014   initially thought to be TIA, neurology consult in 12/2014 with other non TIA considerations.     Polyarthralgia    normal rheumatoid screen 01/2012   TIA (transient ischemic attack) 2015/16    Past Surgical History:  Procedure Laterality Date   COLONOSCOPY  01/2014   diverticulosis, othwerise normal - Dr. Owens Loffler   ESOPHAGOGASTRODUODENOSCOPY  2013   Dr. Benson Norway, gastritis   ESOPHAGOGASTRODUODENOSCOPY  [id number]   EUS N/A 04/16/2014   Procedure: UPPER ENDOSCOPIC ULTRASOUND (EUS) LINEAR;  Surgeon: Milus Banister, MD;  Location: [facility];  Service: Endoscopy;  Laterality: N/A;   gall stone surgery     KNEE ARTHROSCOPY Left    LAPAROSCOPIC GASTRIC RESECTION N/A 06/09/2014   Procedure: LAPAROSCOPIC  GASTRIC MASS RESECTION;  Surgeon: [name], MD;  Location: [facility];  Service: General;  Laterality: N/A;   LIPOMA EXCISION     forehead   LUMBAR LAMINECTOMY N/A 04/25/2019   Procedure: LEFT L2-3 MICRODISCECTOMY, BILATERAL L5-S1 PARTIAL HEMILAMINECTOMY;  Surgeon: Jessy Oto, MD;  Location: [facility];  Service: Orthopedics;  Laterality: N/A;   UPPER GASTROINTESTINAL ENDOSCOPY     UTERINE FIBROID SURGERY      There were no vitals filed for this visit.   Subjective Assessment - 10/13/20 1022     Subjective The patient reports that her knee is a bit ache, but her hip feels like pressure with swelling.  She rates it at a 3-4/10.    Currently in Pain? Yes    Pain Score 4     Pain Location Hip    Pain Orientation Right    Pain Score 1    Pain Location Knee    Pain Orientation Right                [facility] PT Assessment - 10/13/20 0001       Assessment   Medical Diagnosis M70.61 (ICD-10-CM) - Trochanteric bursitis of right hip  M25.561 (ICD-10-CM) - Acute pain of right knee    Referring Provider (PT) Barbaraann Barthel, Sharyn Lull, MD  Onset Date/Surgical Date --   May 2022     AROM   Right Knee Extension -2    Right Knee Flexion 120    Lumbar Flexion 50% no use of hands for return to standing    Lumbar Extension 10% pain aggravated    Lumbar - Right Side Bend mid thigh    Lumbar - Left Side Bend 0% pain aggravated    Lumbar - Right Rotation 25%    Lumbar - Left Rotation 25%      Ambulation/Gait   Ambulation/Gait Yes    Ambulation/Gait Assistance 6: Modified independent (Device/Increase time)    Assistive device Large base quad cane    Gait Pattern Decreased stance time - right    Gait Comments Right hinged knee brace                           [facility] Adult PT Treatment/Exercise - 10/13/20 0001       Lumbar Exercises: Stretches   ITB Stretch 3 reps;20 seconds;Right    Figure 4 Stretch 20 seconds;2 reps;Supine      Lumbar Exercises: Seated   Other Seated  Lumbar Exercises PPT x 15      Lumbar Exercises: Supine   Pelvic Tilt 15 reps;5 seconds    Clam Limitations --    Other Supine Lumbar Exercises LAQ w/ ball squeeze x 10 each      Lumbar Exercises: Sidelying   Clam Left;Right;20 reps    Clam Limitations yellow TB      Knee/Hip Exercises: Supine   Quad Sets Right;1 set;15 reps    Quad Sets Limitations no towel roll                      PT Short Term Goals - 10/13/20 1046       PT SHORT TERM GOAL #1   Title The patient will be independent in an initial HEP.    Time 2    Period Weeks    Status Achieved    Target Date 10/08/20               PT Long Term Goals - 10/13/20 1046       PT LONG TERM GOAL #1   Title The patient will have an improved FOTO score of  67% for functional activities.    Baseline 52%    Time 8    Period Weeks    Status On-going    Target Date 11/19/20      PT LONG TERM GOAL #2   Title The patient will be able to walk using cane for 30 with pain under or equal to 4/10.    Baseline 8/10 pain    Time 8    Period Weeks    Status On-going    Target Date 11/19/20      PT LONG TERM GOAL #3   Title The patinet will be able to perform a single leg stance for 10 seconds on each leg for safely with mobility.    Time 8    Period Weeks    Status On-going    Target Date 11/19/20                   Plan - 10/13/20 1033     Clinical Impression Statement The patient reports that she is having some swelling in the right lateral hip and her knee is just a bit achy.  The patient arrived walking with her quad cane and right hinged knee brace.  the patient preesnts today with improvement of right knee extension motion actively to -2 degrees.  The patients to have difficulty with lumbar AROM.  She is unable to perform right side bending due to pain aggravation.  Her lumbar extension did increase to 10% with pain aggravation.  Recommend continued therapy and progression of core stabilization  for reduction of right hip pain and knee/hip strengthening for improvement of right knee pain and mobility.    Personal Factors and Comorbidities Comorbidity 3+    Comorbidities RA, OA, DDD lumbar, and cardiomyopathy    Examination-Activity Limitations Bathing;Locomotion Level;Transfers;Bed Mobility;Caring for Others;Carry;Lift;Stairs;Squat;Stand;Toileting    Examination-Participation Restrictions Shop;Laundry;Meal Prep    PT Treatment/Interventions ADLs/Self Care Home Management;Aquatic Therapy;Electrical Stimulation;Moist Heat;Traction;Gait training;Stair training;Functional mobility training;Therapeutic activities;Therapeutic exercise;Balance training;DME Instruction;Neuromuscular re-education;Manual techniques;Patient/family education;Dry needling;Joint Manipulations;Spinal Manipulations;Taping    PT Next Visit Plan Balance training, stretches for hip/low back, quad/hip/core stabilization, manual therapy, SLR when able    PT Home Exercise Plan Access Code: [email or web address]    Consulted and Agree with Plan of Care Patient             Patient will benefit from skilled therapeutic intervention in order to improve the following deficits and impairments:  Abnormal gait, Decreased range of motion, Difficulty walking, Decreased endurance, Decreased activity tolerance, Pain, Decreased balance, Decreased mobility, Decreased strength  Visit Diagnosis: Right knee pain, unspecified chronicity  Difficulty in walking, not elsewhere classified  Pain in right hip     Problem List Patient Active Problem List   Diagnosis Date Noted   Prediabetes 02/19/2020   Menopause 11/25/2019   Cardiomyopathy ([place]) 11/19/2019   BMI 38.0-38.9,adult 11/19/2019   Rheumatoid arthritis involving multiple sites with positive rheumatoid factor ([place]) 11/19/2019   Primary osteoarthritis involving multiple joints 11/19/2019   Greater trochanteric bursitis, right 11/19/2019   Decreased activities of daily living (ADL)  11/19/2019   Gait disturbance 11/19/2019   Chronic gastritis without bleeding 11/19/2019   Use of cane as ambulatory aid 11/19/2019   Pain in left shoulder 10/02/2019   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    Class: Chronic   Spinal stenosis of lumbar region 04/25/2019    Class: Chronic   Status post lumbar laminectomy 04/25/2019   Neck pain 11/12/2018   Chronic pain of right knee 09/13/2018   Bruising 09/13/2018   SOB (shortness of breath) 08/30/2018   Chronic pain of left knee 07/08/2018   Chronic hip pain 07/08/2018   Edema 07/08/2018   Left foot pain 04/08/2018   Olecranon bursitis of left elbow 04/05/2018   Leg swelling 12/05/2017   Need for shingles vaccine 12/05/2017   Primary osteoarthritis of both hands 11/23/2017   Rheumatoid factor positive 11/23/2017   Primary osteoarthritis of both knees 11/23/2017   Primary osteoarthritis of both feet 11/23/2017   DDD (degenerative disc disease), lumbar 11/23/2017   Vaccine counseling 08/13/2017   Polyarthralgia 08/13/2017   Joint stiffness 08/13/2017   Sensitive skin 01/23/2017   Need for influenza vaccination 01/23/2017   Proteinuria 01/23/2017   History of gastrointestinal stromal tumor (GIST) 04/20/2016   History of TIA (transient ischemic attack) 04/20/2016   Screening for breast cancer 04/20/2016   Estrogen deficiency 04/20/2016   Chronic maxillary sinusitis 04/20/2016   Impaired fasting blood sugar 04/20/2016   Constipation 04/20/2016   History of fall 04/20/2016   Chronic radicular lumbar pain 01/27/2016   Encounter for health maintenance examination in adult 03/22/2015  Paresthesia 03/22/2015   Cognitive decline 03/22/2015   Screening for cervical cancer 03/22/2015   Vitamin D deficiency 03/22/2015   History of uterine leiomyoma 03/22/2015   Gastroesophageal reflux disease without esophagitis 03/02/2014   Chronic nausea 03/02/2014   Chronic abdominal pain 03/02/2014   Rhinitis, allergic  03/02/2014   Essential hypertension 03/02/2014   Hyperlipidemia 03/02/2014   Obesity with serious comorbidity 01/16/2012   Rich Number, PT, DPT, OCS, Crt. DN  Bethena Midget 10/13/2020, 11:00 AM  [place], [place], [address] Phone: [phone]   Fax:  [phone]  Name: Debe Anfinson MRN: [id number] Date of Birth: 1960-09-05

## 2020-10-18 ENCOUNTER — Ambulatory Visit: Payer: PRIVATE HEALTH INSURANCE

## 2020-10-18 ENCOUNTER — Other Ambulatory Visit: Payer: Self-pay

## 2020-10-18 DIAGNOSIS — R262 Difficulty in walking, not elsewhere classified: Secondary | ICD-10-CM

## 2020-10-18 DIAGNOSIS — M5442 Lumbago with sciatica, left side: Secondary | ICD-10-CM | POA: Diagnosis not present

## 2020-10-18 DIAGNOSIS — M25551 Pain in right hip: Secondary | ICD-10-CM | POA: Diagnosis not present

## 2020-10-18 DIAGNOSIS — M5441 Lumbago with sciatica, right side: Secondary | ICD-10-CM | POA: Diagnosis not present

## 2020-10-18 DIAGNOSIS — M25561 Pain in right knee: Secondary | ICD-10-CM

## 2020-10-18 DIAGNOSIS — G8929 Other chronic pain: Secondary | ICD-10-CM | POA: Diagnosis not present

## 2020-10-18 NOTE — Therapy (Signed)
[place], [place], [address] Phone: [phone]   Fax:  [phone]  Physical Therapy Treatment  Patient Details  Name: Lisa Briggs MRN: [id number] Date of Birth: [birth year] Referring Provider (PT): Dene Gentry, MD   Encounter Date: 10/18/2020   PT End of Session - 10/18/20 1023     Visit Number 5    Number of Visits 9    Date for PT Re-Evaluation 11/19/20    Authorization Type [employer] MCR    Authorization Time Period 6th visit FOTO - 10th Visit FOTO/Re-eval    PT Start Time 1015    PT Stop Time 1058    PT Time Calculation (min) 43 min    Activity Tolerance Patient tolerated treatment well    Behavior During Therapy [facility] for tasks assessed/performed             Past Medical History:  Diagnosis Date   Allergy    Anemia    iron therapy for years as of 10/12; normal Hgb 08/2013   Anxiety    Arthritis    Chest pain 04/05/2011   cardiac eval, normal treadmill stress test, Dr. 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 [name], [facility]   GERD (gastroesophageal reflux disease)    History of uterine fibroid    Hyperlipidemia    Hypertension    Paresthesia 09/2014   initially thought to be TIA, neurology consult in 12/2014 with other non TIA considerations.     Polyarthralgia    normal rheumatoid screen 01/2012   TIA (transient ischemic attack) 2015/16    Past Surgical History:  Procedure Laterality Date   COLONOSCOPY  01/2014   diverticulosis, othwerise normal - Dr. Owens Loffler   ESOPHAGOGASTRODUODENOSCOPY  2013   Dr. Benson Norway, gastritis   ESOPHAGOGASTRODUODENOSCOPY  [id number]   EUS N/A 04/16/2014   Procedure: UPPER ENDOSCOPIC ULTRASOUND (EUS) LINEAR;  Surgeon: Milus Banister, MD;  Location: [facility];  Service: Endoscopy;  Laterality: N/A;    gall stone surgery     KNEE ARTHROSCOPY Left    LAPAROSCOPIC GASTRIC RESECTION N/A 06/09/2014   Procedure: LAPAROSCOPIC GASTRIC MASS RESECTION;  Surgeon: [name], MD;  Location: [facility];  Service: General;  Laterality: N/A;   LIPOMA EXCISION     forehead   LUMBAR LAMINECTOMY N/A 04/25/2019   Procedure: LEFT L2-3 MICRODISCECTOMY, BILATERAL L5-S1 PARTIAL HEMILAMINECTOMY;  Surgeon: Jessy Oto, MD;  Location: [facility];  Service: Orthopedics;  Laterality: N/A;   UPPER GASTROINTESTINAL ENDOSCOPY     UTERINE FIBROID SURGERY      There were no vitals filed for this visit.   Subjective Assessment - 10/18/20 1055     Subjective The patient reports that her right side is still bothering her.    Currently in Pain? Yes    Pain Score 4     Pain Location Back    Pain Orientation Right;Lower                [facility] PT Assessment - 10/18/20 0001       Assessment   Medical Diagnosis M70.61 (ICD-10-CM) - Trochanteric bursitis of right hip  M25.561 (ICD-10-CM) - Acute pain of right knee    Referring Provider (PT) Dene Gentry, MD    Onset Date/Surgical Date --   May 2022  [place] Adult PT Treatment/Exercise - 10/18/20 0001       Lumbar Exercises: Stretches   Lower Trunk Rotation 20 seconds;3 reps    ITB Stretch 3 reps;20 seconds;Right    Figure 4 Stretch 20 seconds;Supine;3 reps      Lumbar Exercises: Aerobic   Nustep L6 x 5 min      Lumbar Exercises: Machines for Strengthening   Leg Press B 35# 10 x 3      Lumbar Exercises: Seated   Long Arc Quad on Chair Left;Right;15 reps    LAQ on Chair Limitations with ball squeeze    Other Seated Lumbar Exercises PPT x 15      Lumbar Exercises: Supine   Pelvic Tilt 15 reps;5 seconds    Straight Leg Raise 10 reps    Straight Leg Raises Limitations 5 x 2 each      Lumbar Exercises: Sidelying   Clam Left;Right;20 reps    Clam Limitations yellow TB                      PT Short  Term Goals - 10/13/20 1046       PT SHORT TERM GOAL #1   Title The patient will be independent in an initial HEP.    Time 2    Period Weeks    Status Achieved    Target Date 10/08/20               PT Long Term Goals - 10/13/20 1046       PT LONG TERM GOAL #1   Title The patient will have an improved FOTO score of  67% for functional activities.    Baseline 52%    Time 8    Period Weeks    Status On-going    Target Date 11/19/20      PT LONG TERM GOAL #2   Title The patient will be able to walk using cane for 30 with pain under or equal to 4/10.    Baseline 8/10 pain    Time 8    Period Weeks    Status On-going    Target Date 11/19/20      PT LONG TERM GOAL #3   Title The patinet will be able to perform a single leg stance for 10 seconds on each leg for safely with mobility.    Time 8    Period Weeks    Status On-going    Target Date 11/19/20                   Plan - 10/18/20 1024     Clinical Impression Statement The patient arrived to [facility] without her right knee hinged knee brace.  She continued to use her cane for ambulation.  Progressed LE strengthening with the leg press bilaterally with 35#.  The patient was able to also progress to supine SLR 5 x 2 reps bilaterally.  She did report fatigue of the quadriceps muscles with the new exercises today.  Recommend continued therapy for core strengthening, functional training, and stretches.    Personal Factors and Comorbidities Comorbidity 3+    Comorbidities RA, OA, DDD lumbar, and cardiomyopathy    Examination-Activity Limitations Bathing;Locomotion Level;Transfers;Bed Mobility;Caring for Others;Carry;Lift;Stairs;Squat;Stand;Toileting    Examination-Participation Restrictions Shop;Laundry;Meal Prep    PT Treatment/Interventions ADLs/Self Care Home Management;Aquatic Therapy;Electrical Stimulation;Moist Heat;Traction;Gait training;Stair training;Functional mobility training;Therapeutic  activities;Therapeutic exercise;Balance training;DME Instruction;Neuromuscular re-education;Manual techniques;Patient/family education;Dry needling;Joint Manipulations;Spinal Manipulations;Taping    PT Next Visit Plan FOTO  for 6th visit, Balance training, stretches for hip/low back, quad/hip/core stabilization, manual therapy, STS    PT Home Exercise Plan Access Code: [email or web address]    Consulted and Agree with Plan of Care Patient             Patient will benefit from skilled therapeutic intervention in order to improve the following deficits and impairments:  Abnormal gait, Decreased range of motion, Difficulty walking, Decreased endurance, Decreased activity tolerance, Pain, Decreased balance, Decreased mobility, Decreased strength  Visit Diagnosis: Right knee pain, unspecified chronicity  Difficulty in walking, not elsewhere classified  Pain in right hip  Chronic bilateral low back pain with bilateral sciatica     Problem List Patient Active Problem List   Diagnosis Date Noted   Prediabetes 02/19/2020   Menopause 11/25/2019   Cardiomyopathy ([place]) 11/19/2019   BMI 38.0-38.9,adult 11/19/2019   Rheumatoid arthritis involving multiple sites with positive rheumatoid factor ([place]) 11/19/2019   Primary osteoarthritis involving multiple joints 11/19/2019   Greater trochanteric bursitis, right 11/19/2019   Decreased activities of daily living (ADL) 11/19/2019   Gait disturbance 11/19/2019   Chronic gastritis without bleeding 11/19/2019   Use of cane as ambulatory aid 11/19/2019   Pain in left shoulder 10/02/2019   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    Class: Chronic   Spinal stenosis of lumbar region 04/25/2019    Class: Chronic   Status post lumbar laminectomy 04/25/2019   Neck pain 11/12/2018   Chronic pain of right knee 09/13/2018   Bruising 09/13/2018   SOB (shortness of breath) 08/30/2018   Chronic pain of left knee 07/08/2018   Chronic hip pain 07/08/2018    Edema 07/08/2018   Left foot pain 04/08/2018   Olecranon bursitis of left elbow 04/05/2018   Leg swelling 12/05/2017   Need for shingles vaccine 12/05/2017   Primary osteoarthritis of both hands 11/23/2017   Rheumatoid factor positive 11/23/2017   Primary osteoarthritis of both knees 11/23/2017   Primary osteoarthritis of both feet 11/23/2017   DDD (degenerative disc disease), lumbar 11/23/2017   Vaccine counseling 08/13/2017   Polyarthralgia 08/13/2017   Joint stiffness 08/13/2017   Sensitive skin 01/23/2017   Need for influenza vaccination 01/23/2017   Proteinuria 01/23/2017   History of gastrointestinal stromal tumor (GIST) 04/20/2016   History of TIA (transient ischemic attack) 04/20/2016   Screening for breast cancer 04/20/2016   Estrogen deficiency 04/20/2016   Chronic maxillary sinusitis 04/20/2016   Impaired fasting blood sugar 04/20/2016   Constipation 04/20/2016   History of fall 04/20/2016   Chronic radicular lumbar pain 01/27/2016   Encounter for health maintenance examination in adult 03/22/2015   Paresthesia 03/22/2015   Cognitive decline 03/22/2015   Screening for cervical cancer 03/22/2015   Vitamin D deficiency 03/22/2015   History of uterine leiomyoma 03/22/2015   Gastroesophageal reflux disease without esophagitis 03/02/2014   Chronic nausea 03/02/2014   Chronic abdominal pain 03/02/2014   Rhinitis, allergic 03/02/2014   Essential hypertension 03/02/2014   Hyperlipidemia 03/02/2014   Obesity with serious comorbidity 01/16/2012   Rich Number, PT, DPT, OCS, Crt. DN  Bethena Midget 10/18/2020, 11:01 AM  [facility] [address] [place], [place], [address] Phone: [phone]   Fax:  [phone]  Name: Lisa Briggs MRN: [id number] Date of Birth: [birth year]

## 2020-10-20 ENCOUNTER — Ambulatory Visit: Payer: PRIVATE HEALTH INSURANCE

## 2020-10-20 ENCOUNTER — Ambulatory Visit: Payer: Medicare HMO | Admitting: Cardiology

## 2020-10-20 ENCOUNTER — Other Ambulatory Visit: Payer: Self-pay

## 2020-10-20 ENCOUNTER — Encounter: Payer: Self-pay | Admitting: Cardiology

## 2020-10-20 VITALS — BP 110/71 | HR 77 | Resp 16 | Ht 69.0 in | Wt 262.0 lb

## 2020-10-20 DIAGNOSIS — R262 Difficulty in walking, not elsewhere classified: Secondary | ICD-10-CM

## 2020-10-20 DIAGNOSIS — I429 Cardiomyopathy, unspecified: Secondary | ICD-10-CM

## 2020-10-20 DIAGNOSIS — M25561 Pain in right knee: Secondary | ICD-10-CM | POA: Diagnosis not present

## 2020-10-20 DIAGNOSIS — G8929 Other chronic pain: Secondary | ICD-10-CM | POA: Diagnosis not present

## 2020-10-20 DIAGNOSIS — I10 Essential (primary) hypertension: Secondary | ICD-10-CM

## 2020-10-20 DIAGNOSIS — M25551 Pain in right hip: Secondary | ICD-10-CM

## 2020-10-20 DIAGNOSIS — M5442 Lumbago with sciatica, left side: Secondary | ICD-10-CM | POA: Diagnosis not present

## 2020-10-20 DIAGNOSIS — E782 Mixed hyperlipidemia: Secondary | ICD-10-CM

## 2020-10-20 DIAGNOSIS — Z8673 Personal history of transient ischemic attack (TIA), and cerebral infarction without residual deficits: Secondary | ICD-10-CM

## 2020-10-20 DIAGNOSIS — M5441 Lumbago with sciatica, right side: Secondary | ICD-10-CM | POA: Diagnosis not present

## 2020-10-20 DIAGNOSIS — I5022 Chronic systolic (congestive) heart failure: Secondary | ICD-10-CM

## 2020-10-20 MED ORDER — ENTRESTO 49-51 MG PO TABS: 1.0000 | ORAL_TABLET | Freq: Two times a day (BID) | ORAL | 0 refills | Status: AC

## 2020-10-20 NOTE — Therapy (Signed)
[place], [place], [address] Phone: [phone]   Fax:  [phone]  Physical Therapy Treatment  Patient Details  Name: Lisa Briggs MRN: [id number] Date of Birth: March 05, 1961 Referring Provider (PT): Dene Gentry, MD   Encounter Date: 10/20/2020   PT End of Session - 10/20/20 1019     Visit Number 6    Number of Visits 16    Date for PT Re-Evaluation 11/19/20    Authorization Type [employer] MCR    Authorization Time Period 6th visit FOTO - 10th Visit FOTO/Re-eval    PT Start Time 0933    PT Stop Time 1018    PT Time Calculation (min) 45 min    Activity Tolerance Patient tolerated treatment well    Behavior During Therapy [facility] for tasks assessed/performed             Past Medical History:  Diagnosis Date   Allergy    Anemia    iron therapy for years as of 10/12; normal Hgb 08/2013   Anxiety    Arthritis    Chest pain 04/05/2011   cardiac eval, normal treadmill stress test, Dr. 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 [name], [facility]   GERD (gastroesophageal reflux disease)    History of uterine fibroid    Hyperlipidemia    Hypertension    Paresthesia 09/2014   initially thought to be TIA, neurology consult in 12/2014 with other non TIA considerations.     Polyarthralgia    normal rheumatoid screen 01/2012   TIA (transient ischemic attack) 2015/16    Past Surgical History:  Procedure Laterality Date   COLONOSCOPY  01/2014   diverticulosis, othwerise normal - Dr. Owens Loffler   ESOPHAGOGASTRODUODENOSCOPY  2013   Dr. Benson Norway, gastritis   ESOPHAGOGASTRODUODENOSCOPY  [id number]   EUS N/A 04/16/2014   Procedure: UPPER ENDOSCOPIC ULTRASOUND (EUS) LINEAR;  Surgeon: Milus Banister, MD;  Location: [facility];  Service: Endoscopy;  Laterality: N/A;    gall stone surgery     KNEE ARTHROSCOPY Left    LAPAROSCOPIC GASTRIC RESECTION N/A 06/09/2014   Procedure: LAPAROSCOPIC GASTRIC MASS RESECTION;  Surgeon: [name], MD;  Location: [facility];  Service: General;  Laterality: N/A;   LIPOMA EXCISION     forehead   LUMBAR LAMINECTOMY N/A 04/25/2019   Procedure: LEFT L2-3 MICRODISCECTOMY, BILATERAL L5-S1 PARTIAL HEMILAMINECTOMY;  Surgeon: Jessy Oto, MD;  Location: [facility];  Service: Orthopedics;  Laterality: N/A;   UPPER GASTROINTESTINAL ENDOSCOPY     UTERINE FIBROID SURGERY      There were no vitals filed for this visit.   Subjective Assessment - 10/20/20 0955     Subjective The patinet reports that she is a bit achy today.  Her right hip and knee is bothering her.    Currently in Pain? Yes    Pain Score 5     Pain Location Back    Pain Orientation Right    Pain Radiating Towards Right hip    Pain Score 4    Pain Location Knee    Pain Orientation Right                [facility] PT Assessment - 10/20/20 0001       Assessment   Medical Diagnosis M70.61 (ICD-10-CM) - Trochanteric bursitis of  right hip  M25.561 (ICD-10-CM) - Acute pain of right knee    Referring Provider (PT) Dene Gentry, MD    Onset Date/Surgical Date --   May 2022     Observation/Other Assessments   Focus on Therapeutic Outcomes (FOTO)  51%      AROM   Right Knee Extension -2    Right Knee Flexion 120    Lumbar Flexion 75%, no use of hands for return to standing    Lumbar Extension 10% pain aggravated    Lumbar - Right Side Bend pain aggravated - 10%    Lumbar - Left Side Bend mid thigh    Lumbar - Right Rotation 25%    Lumbar - Left Rotation 25%      Ambulation/Gait   Ambulation/Gait Yes    Ambulation/Gait Assistance 6: Modified independent (Device/Increase time)    Assistive device Small based quad cane    Gait Pattern Decreased stance time - right    Gait Comments Right hinged knee brace      Standardized Balance Assessment   Standardized  Balance Assessment Five Times Sit to Stand    Five times sit to stand comments  47 sec   no use of hands.                          [place] Adult PT Treatment/Exercise - 10/20/20 0001       Lumbar Exercises: Stretches   Lower Trunk Rotation 20 seconds;3 reps    ITB Stretch 3 reps;20 seconds;Right    Figure 4 Stretch 20 seconds;Supine;3 reps    Other Lumbar Stretch Exercise Sitting FF, FF/Rot L 10" x 5      Lumbar Exercises: Aerobic   Nustep L6 x 6 min      Lumbar Exercises: Machines for Strengthening   Leg Press B 35# 10 x 3      Lumbar Exercises: Seated   Long Arc Quad on Chair Left;Right;15 reps    LAQ on Chair Limitations with ball squeeze    Other Seated Lumbar Exercises PPT x 15      Lumbar Exercises: Supine   Pelvic Tilt 15 reps;5 seconds    Straight Leg Raise --    Straight Leg Raises Limitations --      Lumbar Exercises: Sidelying   Clam Left;Right;20 reps    Clam Limitations yellow TB                      PT Short Term Goals - 10/13/20 1046       PT SHORT TERM GOAL #1   Title The patient will be independent in an initial HEP.    Time 2    Period Weeks    Status Achieved    Target Date 10/08/20               PT Long Term Goals - 10/13/20 1046       PT LONG TERM GOAL #1   Title The patient will have an improved FOTO score of  67% for functional activities.    Baseline 52%    Time 8    Period Weeks    Status On-going    Target Date 11/19/20      PT LONG TERM GOAL #2   Title The patient will be able to walk using cane for 30 with pain under or equal to 4/10.    Baseline 8/10 pain    Time  8    Period Weeks    Status On-going    Target Date 11/19/20      PT LONG TERM GOAL #3   Title The patinet will be able to perform a single leg stance for 10 seconds on each leg for safely with mobility.    Time 8    Period Weeks    Status On-going    Target Date 11/19/20                   Plan - 10/20/20 0942      Clinical Impression Statement Lisa Briggs has attended 6 sessions of physical therapy.  She is progressing slowly at this time.  She presents with increase of time with 5 sit to stand today, but she did not use her hands for the transfers.  On the first assessment she did use her hands for assistance.  The patient continues to have pain aggravation with right lumbar side bending motion. She has improvement of lumbar flexion AROM and no longer uses her hands for return to standing.  Her active lumbar extension improved to 10%.  The patient's FOTO score when down 1% from a 52 to a 51.  Recommend continued therapy for transfers, lumbar range of motion, core stabilization, hip strengthening, and right knee strengthening for daily ADLs.    Personal Factors and Comorbidities Comorbidity 3+    Comorbidities RA, OA, DDD lumbar, and cardiomyopathy    Examination-Activity Limitations Bathing;Locomotion Level;Transfers;Bed Mobility;Caring for Others;Carry;Lift;Stairs;Squat;Stand;Toileting    PT Treatment/Interventions ADLs/Self Care Home Management;Aquatic Therapy;Electrical Stimulation;Moist Heat;Traction;Gait training;Stair training;Functional mobility training;Therapeutic activities;Therapeutic exercise;Balance training;DME Instruction;Neuromuscular re-education;Manual techniques;Patient/family education;Dry needling;Joint Manipulations;Spinal Manipulations;Taping    PT Next Visit Plan , Balance training, stretches for hip/low back, quad/hip/core stabilization, manual therapy, STS    PT Home Exercise Plan Access Code: [email or web address]    [email or web address] and Agree with Plan of Care Patient             Patient will benefit from skilled therapeutic intervention in order to improve the following deficits and impairments:  Abnormal gait, Decreased range of motion, Difficulty walking, Decreased endurance, Decreased activity tolerance, Pain, Decreased balance, Decreased mobility, Decreased strength  Visit Diagnosis: Right knee  pain, unspecified chronicity  Difficulty in walking, not elsewhere classified  Pain in right hip     Problem List Patient Active Problem List   Diagnosis Date Noted   Prediabetes 02/19/2020   Menopause 11/25/2019   Cardiomyopathy ([place]) 11/19/2019   BMI 38.0-38.9,adult 11/19/2019   Rheumatoid arthritis involving multiple sites with positive rheumatoid factor ([place]) 11/19/2019   Primary osteoarthritis involving multiple joints 11/19/2019   Greater trochanteric bursitis, right 11/19/2019   Decreased activities of daily living (ADL) 11/19/2019   Gait disturbance 11/19/2019   Chronic gastritis without bleeding 11/19/2019   Use of cane as ambulatory aid 11/19/2019   Pain in left shoulder 10/02/2019   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    Class: Chronic   Spinal stenosis of lumbar region 04/25/2019    Class: Chronic   Status post lumbar laminectomy 04/25/2019   Neck pain 11/12/2018   Chronic pain of right knee 09/13/2018   Bruising 09/13/2018   SOB (shortness of breath) 08/30/2018   Chronic pain of left knee 07/08/2018   Chronic hip pain 07/08/2018   Edema 07/08/2018   Left foot pain 04/08/2018   Olecranon bursitis of left elbow 04/05/2018   Leg swelling 12/05/2017   Need for shingles vaccine 12/05/2017   Primary osteoarthritis of both hands 11/23/2017  Rheumatoid factor positive 11/23/2017   Primary osteoarthritis of both knees 11/23/2017   Primary osteoarthritis of both feet 11/23/2017   DDD (degenerative disc disease), lumbar 11/23/2017   Vaccine counseling 08/13/2017   Polyarthralgia 08/13/2017   Joint stiffness 08/13/2017   Sensitive skin 01/23/2017   Need for influenza vaccination 01/23/2017   Proteinuria 01/23/2017   History of gastrointestinal stromal tumor (GIST) 04/20/2016   History of TIA (transient ischemic attack) 04/20/2016   Screening for breast cancer 04/20/2016   Estrogen deficiency 04/20/2016   Chronic maxillary sinusitis 04/20/2016    Impaired fasting blood sugar 04/20/2016   Constipation 04/20/2016   History of fall 04/20/2016   Chronic radicular lumbar pain 01/27/2016   Encounter for health maintenance examination in adult 03/22/2015   Paresthesia 03/22/2015   Cognitive decline 03/22/2015   Screening for cervical cancer 03/22/2015   Vitamin D deficiency 03/22/2015   History of uterine leiomyoma 03/22/2015   Gastroesophageal reflux disease without esophagitis 03/02/2014   Chronic nausea 03/02/2014   Chronic abdominal pain 03/02/2014   Rhinitis, allergic 03/02/2014   Essential hypertension 03/02/2014   Hyperlipidemia 03/02/2014   Obesity with serious comorbidity 01/16/2012   Rich Number, PT, DPT, OCS, Crt. DN  Bethena Midget 10/20/2020, 10:24 AM  [facility] [address] [place], [place], [address] Phone: [phone]   Fax:  [phone]  Name: Lisa Briggs MRN: [id number] Date of Birth: 21-Feb-1961

## 2020-10-20 NOTE — Progress Notes (Signed)
Lisa Briggs Date of Birth: [birth year] MRN: [id number] Primary Care Provider:Tysinger, Camelia Eng, PA-C Former Cardiology Providers: Jeri Lager, APRN, FNP-C Primary Cardiologist: Rex Kras, DO, [facility] (established care 07/02/2019)  Date: 10/20/20 Last Office Visit: 08/03/2020   Chief Complaint  Patient presents with   Cardiomyopathy   Follow-up   HPI  Lisa Briggs is a 60 y.o.  female who presents to the office with a chief complaint of "follow-up for cardiomyopathy." Patient's past medical history and cardiovascular risk factors include: Seropositive rheumatoid arthritis (Deveshwar), osteoarthritis, DDD, benign essential hypertension, transient ischemic attack, mildly reduced left ventricular systolic function consistent with cardiomyopathy, obesity.  Patient presents for follow-up given her mildly reduced LVEF.  At the last office visit she was transitioned from losartan to [facility].  She is doing well on the new dose of Entresto and repeat lab work notes stable kidney function and electrolytes.  Unfortunately since last office visit patient sustained a fall and has injured her right knee.  She is currently in physical therapy.  No tentative plans for surgical intervention at this time.  Patient denies any hospitalizations for ACS or congestive heart failure since last office encounter.  Patient has had a thorough cardiac work-up as outlined below with an echocardiogram noting an LVEF of 45-50% and nuclear stress test overall a low risk study.  FUNCTIONAL STATUS: Walks with a 4 wheel walker. No regular routine or exercise program.    ALLERGIES: Allergies  Allergen Reactions   Kiwi Extract Hives and Itching   Mucinex Dm [Dm-Guaifenesin Er] Nausea Only    Messes up her stomach. Pt report on 01/21/15   Peach [Prunus Persica] Itching    Peach peeling makes pt itch     MEDICATION LIST PRIOR TO VISIT: Current Outpatient Medications on File Prior to Visit  Medication Sig Dispense  Refill   aspirin EC 81 MG tablet Take 1 tablet (81 mg total) by mouth daily. 90 tablet 3   blood glucose meter kit and supplies KIT Test once daily-DX-E11.69 1 each 0   carvedilol (COREG) 6.25 MG tablet Take 1 tablet (6.25 mg total) by mouth 2 (two) times daily. 180 tablet 0   cholecalciferol (VITAMIN D3) 25 MCG (1000 UNIT) tablet Take 1 tablet (1,000 Units total) by mouth daily. 90 tablet 3   COVID-19 mRNA Vac-TriS, Pfizer, (PFIZER-BIONT COVID-19 VAC-TRIS) SUSP injection Inject into the muscle. 0.3 mL 0   dexlansoprazole (DEXILANT) 60 MG capsule Take 1 capsule (60 mg total) by mouth daily. 90 capsule 3   diclofenac Sodium (VOLTAREN) 1 % GEL Apply 2 g topically 4 (four) times daily. 100 g 0   etodolac (LODINE) 400 MG tablet Take 400 mg by mouth 2 (two) times daily.     famotidine (PEPCID) 20 MG tablet TAKE 1 TABLET DAILY AT BEDTIME 90 tablet 3   fexofenadine (ALLEGRA ALLERGY) 180 MG tablet Take 1 tablet (180 mg total) by mouth daily. 90 tablet 3   fluticasone (FLONASE) 50 MCG/ACT nasal spray Place 1 spray into both nostrils daily. 16 g 11   gabapentin (NEURONTIN) 300 MG capsule Take 1 capsule (300 mg total) by mouth 3 (three) times daily. 270 capsule 3   Glucose Blood (BLOOD GLUCOSE TEST STRIPS) STRP Inject 1 each as directed daily. 100 strip 5   hydroxychloroquine (PLAQUENIL) 200 MG tablet TAKE 1 TABLET BY MOUTH TWICE DAILY FOR RHEUMATOID ARTHRITIS 180 tablet 0   Insulin Pen Needle (BD PEN NEEDLE NANO U/F) 32G X 4 MM MISC 1 each by Does not  apply route at bedtime. 100 each 11   linaclotide (LINZESS) 145 MCG CAPS capsule Take 1 capsule (145 mcg total) by mouth daily before breakfast. 90 capsule 1   Misc. Devices (ROLLATOR ULTRA-LIGHT) MISC Use as directed. 1 each 0   ondansetron (ZOFRAN) 4 MG tablet Take 1 tablet (4 mg total) by mouth every 8 (eight) hours as needed for nausea or vomiting. TAKE ONE TABLET BY MOUTH EVERY 8 HOURS AS NEEDED FOR  NAUSEA  OR  VOMITING 90 tablet 0   potassium chloride  (KLOR-CON) 10 MEQ tablet Take 1 tablet (10 mEq total) by mouth 2 (two) times daily. (Patient taking differently: Take 10 mEq by mouth daily.) 180 tablet 3   simvastatin (ZOCOR) 20 MG tablet Take 1 tablet (20 mg total) by mouth daily. 90 tablet 3   spironolactone (ALDACTONE) 25 MG tablet Take 1 tablet by mouth once daily 90 tablet 0   venlafaxine XR (EFFEXOR-XR) 37.5 MG 24 hr capsule TAKE 1 CAPSULE DAILY WITH BREAKFAST. 90 capsule 1   No current facility-administered medications on file prior to visit.    PAST MEDICAL HISTORY: Past Medical History:  Diagnosis Date   Allergy    Anemia    iron therapy for years as of 10/12; normal Hgb 08/2013   Anxiety    Arthritis    Chest pain 04/05/2011   cardiac eval, normal treadmill stress test, Dr. 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 [name], [facility]   GERD (gastroesophageal reflux disease)    History of uterine fibroid    Hyperlipidemia    Hypertension    Paresthesia 09/2014   initially thought to be TIA, neurology consult in 12/2014 with other non TIA considerations.     Polyarthralgia    normal rheumatoid screen 01/2012   TIA (transient ischemic attack) 2015/16    PAST SURGICAL HISTORY: Past Surgical History:  Procedure Laterality Date   COLONOSCOPY  01/2014   diverticulosis, othwerise normal - Dr. Owens Loffler   ESOPHAGOGASTRODUODENOSCOPY  2013   Dr. Benson Norway, gastritis   ESOPHAGOGASTRODUODENOSCOPY  [id number]   EUS N/A 04/16/2014   Procedure: UPPER ENDOSCOPIC ULTRASOUND (EUS) LINEAR;  Surgeon: Milus Banister, MD;  Location: [facility];  Service: Endoscopy;  Laterality: N/A;   gall stone surgery     KNEE ARTHROSCOPY Left    LAPAROSCOPIC GASTRIC RESECTION N/A 06/09/2014   Procedure: LAPAROSCOPIC GASTRIC MASS RESECTION;  Surgeon: [name], MD;  Location: [facility];  Service: General;   Laterality: N/A;   LIPOMA EXCISION     forehead   LUMBAR LAMINECTOMY N/A 04/25/2019   Procedure: LEFT L2-3 MICRODISCECTOMY, BILATERAL L5-S1 PARTIAL HEMILAMINECTOMY;  Surgeon: Jessy Oto, MD;  Location: [facility];  Service: Orthopedics;  Laterality: N/A;   UPPER GASTROINTESTINAL ENDOSCOPY     UTERINE FIBROID SURGERY      FAMILY HISTORY: The patient's family history includes Arthritis in her mother; Breast cancer in her mother and sister; Colon cancer (age of onset: 35) in her brother; Diabetes in her brother and maternal aunt; GER disease in her mother; Glaucoma in her mother; Healthy in her son; Heart disease in her maternal aunt, maternal grandfather, maternal grandmother, and maternal uncle; Hypertension in her mother, sister, and sister; Leukemia in her sister; Stroke in her father and maternal uncle.   SOCIAL HISTORY:  The patient  reports that  she has never smoked. She has never used smokeless tobacco. She reports that she does not drink alcohol and does not use drugs.  Review of Systems  Constitutional: Negative for chills, fever and weight gain.  HENT:  Negative for ear discharge, ear pain and nosebleeds.   Eyes:  Negative for blurred vision and discharge.  Cardiovascular:  Positive for dyspnea on exertion. Negative for chest pain, claudication, leg swelling, near-syncope, orthopnea, palpitations, paroxysmal nocturnal dyspnea and syncope.  Respiratory:  Negative for cough and shortness of breath.   Endocrine: Negative for polydipsia, polyphagia and polyuria.  Hematologic/Lymphatic: Negative for bleeding problem.  Skin:  Negative for flushing and nail changes.  Musculoskeletal:  Positive for arthritis and joint pain (knee pain). Negative for muscle cramps, muscle weakness and myalgias.  Gastrointestinal:  Positive for heartburn. Negative for abdominal pain, dysphagia, hematemesis, hematochezia, melena, nausea and vomiting.  Neurological:  Negative for dizziness, focal weakness and  light-headedness.   PHYSICAL EXAM: Vitals with BMI 10/20/2020 10/12/2020 09/15/2020  Height 5' 9"  5' 9"  5' 9"   Weight 262 lbs 258 lbs 13 oz 260 lbs  BMI 38.67 68.1 27.51  Systolic 700 174 944  Diastolic 71 78 76  Pulse 77 70 -   CONSTITUTIONAL: Age-appropriate African-American female, obese, walks with cane, no acute distress, hemodynamically stable.  SKIN: Skin is warm and dry. No rash noted. No cyanosis. No pallor. No jaundice HEAD: Normocephalic and atraumatic.  EYES: No scleral icterus MOUTH/THROAT: Moist oral membranes.  NECK: No JVD present. No thyromegaly noted. No carotid bruits  LYMPHATIC: No visible cervical adenopathy.  CHEST Normal respiratory effort. No intercostal retractions  LUNGS: Clear to auscultation bilaterally.  No stridor. No wheezes. No rales.  CARDIOVASCULAR: Regular rate and rhythm, positive S1-S2, no murmurs rubs or gallops appreciated.  ABDOMINAL: Obese, soft, nontender, nondistended, positive bowel sounds all 4 quadrants.No apparent ascites.  EXTREMITIES: No peripheral edema. Brace right knee.  HEMATOLOGIC: No significant bruising NEUROLOGIC: Oriented to person, place, and time. Nonfocal. Normal muscle tone.  PSYCHIATRIC: Normal mood and affect. Normal behavior. Cooperative  CARDIAC DATABASE: EKG: 08/03/2020: Normal sinus rhythm, 88 bpm, normal axis, LAE, consider anteroseptal ischemia. No change compared to prior ECG.   Echocardiogram: 04/17/2019: LVEF 96-75%, grade 2 diastolic dysfunction, elevated left atrial pressure, mild TR, no pulmonary hypertension.  06/05/2019: LVEF 45-50%, normal wall thickness, mild global hypokinesis, grade 1 diastolic impairment, normal left atrial pressure, mild MR, mild TR, mild TR, no pulmonary hypertension.  Stress Testing:  Lexiscan  Sestamibi Stress Test 07/09/2019: Myocardial perfusion is normal. Stress LV EF: 51%.  No previous exam available for comparison. Low risk study.   Heart Catheterization: None  LABORATORY  DATA: CBC Latest Ref Rng & Units 10/11/2020 05/11/2020 11/19/2019  WBC 3.8 - 10.8 Thousand/uL 6.2 6.2 6.0  Hemoglobin 11.7 - 15.5 g/dL 12.7 12.9 12.6  Hematocrit 35.0 - 45.0 % 40.6 40.8 40.1  Platelets 140 - 400 Thousand/uL 358 361 358    CMP Latest Ref Rng & Units 10/11/2020 08/20/2020 05/11/2020  Glucose 65 - 99 mg/dL 96 87 86  BUN 7 - 25 mg/dL 11 13 16   Creatinine 0.50 - 1.03 mg/dL 0.74 0.86 0.63  Sodium 135 - 146 mmol/L 141 144 143  Potassium 3.5 - 5.3 mmol/L 4.3 4.2 5.1  Chloride 98 - 110 mmol/L 105 107(H) 106  CO2 20 - 32 mmol/L 31 23 31   Calcium 8.6 - 10.4 mg/dL 9.0 9.4 9.6  Total Protein 6.1 - 8.1 g/dL 6.3 - 6.8  Total Bilirubin 0.2 -  1.2 mg/dL 0.4 - 0.3  Alkaline Phos 48 - 121 IU/L - - -  AST 10 - 35 U/L 14 - 13  ALT 6 - 29 U/L 12 - 12    Lipid Panel  Lab Results  Component Value Date   CHOL 162 07/18/2019   HDL 77 07/18/2019   LDLCALC 71 07/18/2019   TRIG 70 07/18/2019   CHOLHDL 2.1 07/18/2019    Lab Results  Component Value Date   HGBA1C 6.0 (H) 11/19/2019   HGBA1C 5.9 (A) 07/18/2019   HGBA1C 5.9 (H) 12/17/2018   No components found for: NTPROBNP Lab Results  Component Value Date   TSH 1.700 11/19/2019   TSH 1.860 08/30/2018   TSH 0.56 09/20/2016    Cardiac Panel (last 3 results) No results for input(s): CKTOTAL, CKMB, TROPONINIHS, RELINDX in the last 72 hours.  FINAL MEDICATION LIST END OF ENCOUNTER: Meds ordered this encounter  Medications   sacubitril-valsartan (ENTRESTO) 49-51 MG    Sig: Take 1 tablet by mouth 2 (two) times daily.    Dispense:  180 tablet    Refill:  0     Medications Discontinued During This Encounter  Medication Reason   meloxicam (MOBIC) 15 MG tablet Error   sacubitril-valsartan (ENTRESTO) 49-51 MG Reorder     Current Outpatient Medications:    aspirin EC 81 MG tablet, Take 1 tablet (81 mg total) by mouth daily., Disp: 90 tablet, Rfl: 3   blood glucose meter kit and supplies KIT, Test once daily-DX-E11.69, Disp: 1 each,  Rfl: 0   carvedilol (COREG) 6.25 MG tablet, Take 1 tablet (6.25 mg total) by mouth 2 (two) times daily., Disp: 180 tablet, Rfl: 0   cholecalciferol (VITAMIN D3) 25 MCG (1000 UNIT) tablet, Take 1 tablet (1,000 Units total) by mouth daily., Disp: 90 tablet, Rfl: 3   COVID-19 mRNA Vac-TriS, Pfizer, (PFIZER-BIONT COVID-19 VAC-TRIS) SUSP injection, Inject into the muscle., Disp: 0.3 mL, Rfl: 0   dexlansoprazole (DEXILANT) 60 MG capsule, Take 1 capsule (60 mg total) by mouth daily., Disp: 90 capsule, Rfl: 3   diclofenac Sodium (VOLTAREN) 1 % GEL, Apply 2 g topically 4 (four) times daily., Disp: 100 g, Rfl: 0   etodolac (LODINE) 400 MG tablet, Take 400 mg by mouth 2 (two) times daily., Disp: , Rfl:    famotidine (PEPCID) 20 MG tablet, TAKE 1 TABLET DAILY AT BEDTIME, Disp: 90 tablet, Rfl: 3   fexofenadine (ALLEGRA ALLERGY) 180 MG tablet, Take 1 tablet (180 mg total) by mouth daily., Disp: 90 tablet, Rfl: 3   fluticasone (FLONASE) 50 MCG/ACT nasal spray, Place 1 spray into both nostrils daily., Disp: 16 g, Rfl: 11   gabapentin (NEURONTIN) 300 MG capsule, Take 1 capsule (300 mg total) by mouth 3 (three) times daily., Disp: 270 capsule, Rfl: 3   Glucose Blood (BLOOD GLUCOSE TEST STRIPS) STRP, Inject 1 each as directed daily., Disp: 100 strip, Rfl: 5   hydroxychloroquine (PLAQUENIL) 200 MG tablet, TAKE 1 TABLET BY MOUTH TWICE DAILY FOR RHEUMATOID ARTHRITIS, Disp: 180 tablet, Rfl: 0   Insulin Pen Needle (BD PEN NEEDLE NANO U/F) 32G X 4 MM MISC, 1 each by Does not apply route at bedtime., Disp: 100 each, Rfl: 11   linaclotide (LINZESS) 145 MCG CAPS capsule, Take 1 capsule (145 mcg total) by mouth daily before breakfast., Disp: 90 capsule, Rfl: 1   Misc. Devices (ROLLATOR ULTRA-LIGHT) MISC, Use as directed., Disp: 1 each, Rfl: 0   ondansetron (ZOFRAN) 4 MG tablet, Take 1 tablet (4  mg total) by mouth every 8 (eight) hours as needed for nausea or vomiting. TAKE ONE TABLET BY MOUTH EVERY 8 HOURS AS NEEDED FOR  NAUSEA   OR  VOMITING, Disp: 90 tablet, Rfl: 0   potassium chloride (KLOR-CON) 10 MEQ tablet, Take 1 tablet (10 mEq total) by mouth 2 (two) times daily. (Patient taking differently: Take 10 mEq by mouth daily.), Disp: 180 tablet, Rfl: 3   simvastatin (ZOCOR) 20 MG tablet, Take 1 tablet (20 mg total) by mouth daily., Disp: 90 tablet, Rfl: 3   spironolactone (ALDACTONE) 25 MG tablet, Take 1 tablet by mouth once daily, Disp: 90 tablet, Rfl: 0   venlafaxine XR (EFFEXOR-XR) 37.5 MG 24 hr capsule, TAKE 1 CAPSULE DAILY WITH BREAKFAST., Disp: 90 capsule, Rfl: 1   sacubitril-valsartan (ENTRESTO) 49-51 MG, Take 1 tablet by mouth 2 (two) times daily., Disp: 180 tablet, Rfl: 0  IMPRESSION:    ICD-10-CM   1. Cardiomyopathy, presumed nonischemic  I42.9 sacubitril-valsartan (ENTRESTO) 49-51 MG    2. Chronic HFrEF, NYHA Class II/III, stage B (heart failure with reduced ejection fraction) (HCC)  I50.22 sacubitril-valsartan (ENTRESTO) 49-51 MG    3. Benign hypertension  I10     4. Mixed hyperlipidemia  E78.2     5. History of TIA (transient ischemic attack)  Z86.73     6. Class 2 severe obesity due to excess calories with serious comorbidity and body mass index (BMI) of 38.0 to 38.9 in adult [facility])  E66.01    Z68.38        RECOMMENDATIONS: Lisa Briggs is a 60 y.o. female whose past medical history and cardiovascular risk factors include: Seropositive rheumatoid arthritis (Deveshwar), osteoarthritis, DDD, benign essential hypertension, transient ischemic attack, mildly reduced left ventricular systolic function consistent with cardiomyopathy, obesity.  Chronic heart failure with reduced EF, stage B, NYHA class II/III: Euvolemic and dyspnea remains stable.  No recent hospitalizations for congestive heart failure. Last NT-proBNP 35 (08/2020) Tolerating Entresto 49/45m po bid.  Medications reconciliation.   Cardiomyopathy, presumed nonischemic: See above.   History of TIA: Currently on aspirin and statin  therapy.  Continue risk factor modifications for secondary prevention.  Benign essential hypertension with chronic kidney disease stage II: Office blood pressure is at goal.  Medication reconciled.  Currently managed by primary care provider. Low salt diet recommended.   Hyperlipidemia: Continue statin therapy. Does not endorse myalgias. Currently managed by primary care provider.  Obesity, due to excess calories: Body mass index is 38.69 kg/m. I reviewed with the patient the importance of diet, regular physical activity/exercise, weight loss.   Patient is educated on increasing physical activity gradually as tolerated.  With the goal of moderate intensity exercise for 30 minutes a day 5 days a week.  No orders of the defined types were placed in this encounter.  --Continue cardiac medications as reconciled in final medication list. --Return in about 6 months (around 04/22/2021) for Follow up, heart failure management.. Or sooner if needed. --Continue follow-up with your primary care physician regarding the management of your other chronic comorbid conditions.  Patient's questions and concerns were addressed to her satisfaction. She voices understanding of the instructions provided during this encounter.   This note was created using a voice recognition software as a result there may be grammatical errors inadvertently enclosed that do not reflect the nature of this encounter. Every attempt is made to correct such errors.   SRex Kras DNevada FAdvocate Sherman Hospital Pager: [phone]: [phone]

## 2020-10-21 DIAGNOSIS — I2510 Atherosclerotic heart disease of native coronary artery without angina pectoris: Secondary | ICD-10-CM | POA: Diagnosis not present

## 2020-10-21 DIAGNOSIS — G8929 Other chronic pain: Secondary | ICD-10-CM | POA: Diagnosis not present

## 2020-10-21 DIAGNOSIS — M0550 Rheumatoid polyneuropathy with rheumatoid arthritis of unspecified site: Secondary | ICD-10-CM | POA: Diagnosis not present

## 2020-10-21 DIAGNOSIS — R69 Illness, unspecified: Secondary | ICD-10-CM | POA: Diagnosis not present

## 2020-10-21 DIAGNOSIS — I10 Essential (primary) hypertension: Secondary | ICD-10-CM | POA: Diagnosis not present

## 2020-10-21 DIAGNOSIS — I499 Cardiac arrhythmia, unspecified: Secondary | ICD-10-CM | POA: Diagnosis not present

## 2020-10-21 DIAGNOSIS — D84821 Immunodeficiency due to drugs: Secondary | ICD-10-CM | POA: Diagnosis not present

## 2020-10-21 DIAGNOSIS — J309 Allergic rhinitis, unspecified: Secondary | ICD-10-CM | POA: Diagnosis not present

## 2020-10-21 DIAGNOSIS — E785 Hyperlipidemia, unspecified: Secondary | ICD-10-CM | POA: Diagnosis not present

## 2020-10-25 ENCOUNTER — Other Ambulatory Visit: Payer: Self-pay

## 2020-10-25 ENCOUNTER — Ambulatory Visit: Payer: PRIVATE HEALTH INSURANCE

## 2020-10-25 DIAGNOSIS — M25551 Pain in right hip: Secondary | ICD-10-CM

## 2020-10-25 DIAGNOSIS — G8929 Other chronic pain: Secondary | ICD-10-CM | POA: Diagnosis not present

## 2020-10-25 DIAGNOSIS — M5442 Lumbago with sciatica, left side: Secondary | ICD-10-CM | POA: Diagnosis not present

## 2020-10-25 DIAGNOSIS — M25561 Pain in right knee: Secondary | ICD-10-CM | POA: Diagnosis not present

## 2020-10-25 DIAGNOSIS — R262 Difficulty in walking, not elsewhere classified: Secondary | ICD-10-CM | POA: Diagnosis not present

## 2020-10-25 DIAGNOSIS — M5441 Lumbago with sciatica, right side: Secondary | ICD-10-CM | POA: Diagnosis not present

## 2020-10-25 NOTE — Therapy (Signed)
[place], [place], [address] Phone: [phone]   Fax:  [phone]  Physical Therapy Treatment  Patient Details  Name: Lisa Briggs MRN: [id number] Date of Birth: March 29, 1961 Referring Provider (PT): Dene Gentry, MD   Encounter Date: 10/25/2020   PT End of Session - 10/25/20 1148     Visit Number 7    Number of Visits 16    Date for PT Re-Evaluation 11/19/20    Authorization Type [employer] MCR    Authorization Time Period 6th visit FOTO - 10th Visit FOTO/Re-eval    PT Start Time 1148    PT Stop Time 1240   ice/heat at end of session for 10 minutes   PT Time Calculation (min) 52 min    Activity Tolerance Patient tolerated treatment well    Behavior During Therapy [facility] for tasks assessed/performed             Past Medical History:  Diagnosis Date   Allergy    Anemia    iron therapy for years as of 10/12; normal Hgb 08/2013   Anxiety    Arthritis    Chest pain 04/05/2011   cardiac eval, normal treadmill stress test, Dr. 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 [name], [facility]   GERD (gastroesophageal reflux disease)    History of uterine fibroid    Hyperlipidemia    Hypertension    Paresthesia 09/2014   initially thought to be TIA, neurology consult in 12/2014 with other non TIA considerations.     Polyarthralgia    normal rheumatoid screen 01/2012   TIA (transient ischemic attack) 2015/16    Past Surgical History:  Procedure Laterality Date   COLONOSCOPY  01/2014   diverticulosis, othwerise normal - Dr. Owens Loffler   ESOPHAGOGASTRODUODENOSCOPY  2013   Dr. Benson Norway, gastritis   ESOPHAGOGASTRODUODENOSCOPY  [id number]   EUS N/A 04/16/2014   Procedure: UPPER ENDOSCOPIC ULTRASOUND (EUS) LINEAR;  Surgeon: Milus Banister, MD;  Location: [facility];   Service: Endoscopy;  Laterality: N/A;   gall stone surgery     KNEE ARTHROSCOPY Left    LAPAROSCOPIC GASTRIC RESECTION N/A 06/09/2014   Procedure: LAPAROSCOPIC GASTRIC MASS RESECTION;  Surgeon: [name], MD;  Location: [facility];  Service: General;  Laterality: N/A;   LIPOMA EXCISION     forehead   LUMBAR LAMINECTOMY N/A 04/25/2019   Procedure: LEFT L2-3 MICRODISCECTOMY, BILATERAL L5-S1 PARTIAL HEMILAMINECTOMY;  Surgeon: Jessy Oto, MD;  Location: [place] [facility];  Service: Orthopedics;  Laterality: N/A;   UPPER GASTROINTESTINAL ENDOSCOPY     UTERINE FIBROID SURGERY      There were no vitals filed for this visit.   Subjective Assessment - 10/25/20 1151     Subjective Patient reports mostly stiffness on the right side. She reports compliance with HEP.    Currently in Pain? No/denies                               [facility] Adult PT Treatment/Exercise - 10/25/20 0001       Lumbar Exercises: Stretches   Hip Flexor Stretch 60 seconds    Hip Flexor Stretch Limitations right    Figure 4 Stretch 30 seconds    Figure 4 Stretch Limitations x2; bilateral  Lumbar Exercises: Aerobic   Nustep L5 x 5 min      Lumbar Exercises: Supine   Pelvic Tilt 10 reps    Pelvic Tilt Limitations 5 sec hold; posterior    Bent Knee Raise 10 reps    Bent Knee Raise Limitations x2; posterior pelvic tilt    Bridge Limitations x10 partial range    Straight Leg Raises Limitations 2 x 8 bilateral; partial range      Lumbar Exercises: Sidelying   Hip Abduction 10 reps    Hip Abduction Limitations x2; bilateral      Modalities   Modalities Cryotherapy;Moist Heat      Moist Heat Therapy   Number Minutes Moist Heat 10 Minutes    Moist Heat Location Lumbar Spine      Cryotherapy   Number Minutes Cryotherapy 10 Minutes    Cryotherapy Location Knee    Type of Cryotherapy Ice pack                      PT Short Term Goals - 10/13/20 1046       PT SHORT TERM GOAL #1    Title The patient will be independent in an initial HEP.    Time 2    Period Weeks    Status Achieved    Target Date 10/08/20               PT Long Term Goals - 10/13/20 1046       PT LONG TERM GOAL #1   Title The patient will have an improved FOTO score of  67% for functional activities.    Baseline 52%    Time 8    Period Weeks    Status On-going    Target Date 11/19/20      PT LONG TERM GOAL #2   Title The patient will be able to walk using cane for 30 with pain under or equal to 4/10.    Baseline 8/10 pain    Time 8    Period Weeks    Status On-going    Target Date 11/19/20      PT LONG TERM GOAL #3   Title The patinet will be able to perform a single leg stance for 10 seconds on each leg for safely with mobility.    Time 8    Period Weeks    Status On-going    Target Date 11/19/20                   Plan - 10/25/20 1155     Clinical Impression Statement Patient arrived with complaints of low back and Rt knee stiffness. Able to progress core stabilization and hip strengthening with patient quickly fatiguing during dynamic core stabilization and hip abductor strengthening. She is able to complete SLR through partial range, though due to weakness and anterior knee pain unable to complete full range on the RLE. She has difficulty with hip bridging due to weakness and is only able to perform through partial range after continued practice and heavy cues for proper muscle activation. Overall she tolerated session well today with her only complaint of knee pain occuring during SLR. She requested ice for her knee and heat for her back at the end of the session.    Personal Factors and Comorbidities Comorbidity 3+    Comorbidities RA, OA, DDD lumbar, and cardiomyopathy    Examination-Activity Limitations Bathing;Locomotion Level;Transfers;Bed Mobility;Caring for Others;Carry;Lift;Stairs;Squat;Stand;Toileting    PT Treatment/Interventions ADLs/Self  Care Home  Management;Aquatic Therapy;Electrical Stimulation;Moist Heat;Traction;Gait training;Stair training;Functional mobility training;Therapeutic activities;Therapeutic exercise;Balance training;DME Instruction;Neuromuscular re-education;Manual techniques;Patient/family education;Dry needling;Joint Manipulations;Spinal Manipulations;Taping    PT Next Visit Plan , Balance training, stretches for hip/low back, quad/hip/core stabilization, manual therapy, STS    PT Home Exercise Plan Access Code: [email or web address]    Consulted and Agree with Plan of Care Patient             Patient will benefit from skilled therapeutic intervention in order to improve the following deficits and impairments:  Abnormal gait, Decreased range of motion, Difficulty walking, Decreased endurance, Decreased activity tolerance, Pain, Decreased balance, Decreased mobility, Decreased strength  Visit Diagnosis: Right knee pain, unspecified chronicity  Difficulty in walking, not elsewhere classified  Pain in right hip     Problem List Patient Active Problem List   Diagnosis Date Noted   Prediabetes 02/19/2020   Menopause 11/25/2019   Cardiomyopathy ([place]) 11/19/2019   BMI 38.0-38.9,adult 11/19/2019   Rheumatoid arthritis involving multiple sites with positive rheumatoid factor ([place]) 11/19/2019   Primary osteoarthritis involving multiple joints 11/19/2019   Greater trochanteric bursitis, right 11/19/2019   Decreased activities of daily living (ADL) 11/19/2019   Gait disturbance 11/19/2019   Chronic gastritis without bleeding 11/19/2019   Use of cane as ambulatory aid 11/19/2019   Pain in left shoulder 10/02/2019   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    Class: Chronic   Spinal stenosis of lumbar region 04/25/2019    Class: Chronic   Status post lumbar laminectomy 04/25/2019   Neck pain 11/12/2018   Chronic pain of right knee 09/13/2018   Bruising 09/13/2018   SOB (shortness of breath) 08/30/2018    Chronic pain of left knee 07/08/2018   Chronic hip pain 07/08/2018   Edema 07/08/2018   Left foot pain 04/08/2018   Olecranon bursitis of left elbow 04/05/2018   Leg swelling 12/05/2017   Need for shingles vaccine 12/05/2017   Primary osteoarthritis of both hands 11/23/2017   Rheumatoid factor positive 11/23/2017   Primary osteoarthritis of both knees 11/23/2017   Primary osteoarthritis of both feet 11/23/2017   DDD (degenerative disc disease), lumbar 11/23/2017   Vaccine counseling 08/13/2017   Polyarthralgia 08/13/2017   Joint stiffness 08/13/2017   Sensitive skin 01/23/2017   Need for influenza vaccination 01/23/2017   Proteinuria 01/23/2017   History of gastrointestinal stromal tumor (GIST) 04/20/2016   History of TIA (transient ischemic attack) 04/20/2016   Screening for breast cancer 04/20/2016   Estrogen deficiency 04/20/2016   Chronic maxillary sinusitis 04/20/2016   Impaired fasting blood sugar 04/20/2016   Constipation 04/20/2016   History of fall 04/20/2016   Chronic radicular lumbar pain 01/27/2016   Encounter for health maintenance examination in adult 03/22/2015   Paresthesia 03/22/2015   Cognitive decline 03/22/2015   Screening for cervical cancer 03/22/2015   Vitamin D deficiency 03/22/2015   History of uterine leiomyoma 03/22/2015   Gastroesophageal reflux disease without esophagitis 03/02/2014   Chronic nausea 03/02/2014   Chronic abdominal pain 03/02/2014   Rhinitis, allergic 03/02/2014   Essential hypertension 03/02/2014   Hyperlipidemia 03/02/2014   Obesity with serious comorbidity 01/16/2012   Gwendolyn Grant, PT, DPT, ATC 10/25/20 12:35 PM   [facility] [address] [place], [place], [address] Phone: [phone]   Fax:  [phone]  Name: Sammijo Naval MRN: [id number] Date of Birth: 23-Feb-1961

## 2020-10-27 ENCOUNTER — Ambulatory Visit: Payer: PRIVATE HEALTH INSURANCE

## 2020-10-27 ENCOUNTER — Telehealth: Payer: Self-pay

## 2020-10-27 NOTE — Telephone Encounter (Signed)
Lisa Briggs did not show to her appointment today.   I called her left a voicemail informing her of the missed appointment and reminded her of the next upcoming appointment.  Rich Number, PT, DPT, OCS, Crt. DN

## 2020-11-01 ENCOUNTER — Ambulatory Visit: Payer: PRIVATE HEALTH INSURANCE | Attending: Family Medicine | Admitting: Physical Therapy

## 2020-11-01 ENCOUNTER — Telehealth: Payer: Self-pay | Admitting: Physical Therapy

## 2020-11-01 DIAGNOSIS — M62830 Muscle spasm of back: Secondary | ICD-10-CM | POA: Insufficient documentation

## 2020-11-01 DIAGNOSIS — M25521 Pain in right elbow: Secondary | ICD-10-CM | POA: Insufficient documentation

## 2020-11-01 DIAGNOSIS — M5442 Lumbago with sciatica, left side: Secondary | ICD-10-CM | POA: Insufficient documentation

## 2020-11-01 DIAGNOSIS — M25522 Pain in left elbow: Secondary | ICD-10-CM | POA: Insufficient documentation

## 2020-11-01 DIAGNOSIS — M25551 Pain in right hip: Secondary | ICD-10-CM | POA: Insufficient documentation

## 2020-11-01 DIAGNOSIS — M25561 Pain in right knee: Secondary | ICD-10-CM | POA: Insufficient documentation

## 2020-11-01 DIAGNOSIS — G8929 Other chronic pain: Secondary | ICD-10-CM | POA: Insufficient documentation

## 2020-11-01 DIAGNOSIS — M6281 Muscle weakness (generalized): Secondary | ICD-10-CM | POA: Insufficient documentation

## 2020-11-01 DIAGNOSIS — M25651 Stiffness of right hip, not elsewhere classified: Secondary | ICD-10-CM | POA: Insufficient documentation

## 2020-11-01 DIAGNOSIS — M25652 Stiffness of left hip, not elsewhere classified: Secondary | ICD-10-CM | POA: Insufficient documentation

## 2020-11-01 DIAGNOSIS — R600 Localized edema: Secondary | ICD-10-CM | POA: Insufficient documentation

## 2020-11-01 DIAGNOSIS — M5441 Lumbago with sciatica, right side: Secondary | ICD-10-CM | POA: Insufficient documentation

## 2020-11-01 DIAGNOSIS — R262 Difficulty in walking, not elsewhere classified: Secondary | ICD-10-CM | POA: Insufficient documentation

## 2020-11-01 NOTE — Telephone Encounter (Signed)
Contacted patient regarding no show. She reports that she felt "woozy" yesterday so stayed in the bed today. Reminded her of the attendance policy and she reports that she was unaware that she missed her appointment last week. She does plan to attend her next appointment and was reminded to call the clinic prior to scheduled appointment.

## 2020-11-03 ENCOUNTER — Other Ambulatory Visit: Payer: Self-pay

## 2020-11-03 ENCOUNTER — Ambulatory Visit: Payer: PRIVATE HEALTH INSURANCE

## 2020-11-03 DIAGNOSIS — M25561 Pain in right knee: Secondary | ICD-10-CM | POA: Diagnosis not present

## 2020-11-03 DIAGNOSIS — R600 Localized edema: Secondary | ICD-10-CM | POA: Diagnosis not present

## 2020-11-03 DIAGNOSIS — M6281 Muscle weakness (generalized): Secondary | ICD-10-CM | POA: Diagnosis not present

## 2020-11-03 DIAGNOSIS — M25651 Stiffness of right hip, not elsewhere classified: Secondary | ICD-10-CM | POA: Diagnosis present

## 2020-11-03 DIAGNOSIS — R262 Difficulty in walking, not elsewhere classified: Secondary | ICD-10-CM | POA: Diagnosis not present

## 2020-11-03 DIAGNOSIS — G8929 Other chronic pain: Secondary | ICD-10-CM

## 2020-11-03 DIAGNOSIS — M25522 Pain in left elbow: Secondary | ICD-10-CM | POA: Diagnosis present

## 2020-11-03 DIAGNOSIS — M5441 Lumbago with sciatica, right side: Secondary | ICD-10-CM | POA: Diagnosis not present

## 2020-11-03 DIAGNOSIS — M25521 Pain in right elbow: Secondary | ICD-10-CM | POA: Diagnosis not present

## 2020-11-03 DIAGNOSIS — M5442 Lumbago with sciatica, left side: Secondary | ICD-10-CM | POA: Diagnosis not present

## 2020-11-03 DIAGNOSIS — M25652 Stiffness of left hip, not elsewhere classified: Secondary | ICD-10-CM | POA: Diagnosis present

## 2020-11-03 DIAGNOSIS — M62830 Muscle spasm of back: Secondary | ICD-10-CM | POA: Diagnosis not present

## 2020-11-03 DIAGNOSIS — M25551 Pain in right hip: Secondary | ICD-10-CM | POA: Diagnosis not present

## 2020-11-03 NOTE — Patient Instructions (Signed)
Access Code: [email or web address] URL: [email or web address] Date: 11/03/2020 Prepared by: Rich Number  Exercises Supine Posterior Pelvic Tilt - 2 x daily - 7 x weekly - 1 sets - 10 reps - 3 hold Supine Heel Slide - 2 x daily - 7 x weekly - 1 sets - 15 reps Supine Lower Trunk Rotation - 2 x daily - 7 x weekly - 1 sets - 10 reps - 5 hold Clamshell with Resistance - 1 x daily - 7 x weekly - 1 sets - 20 reps Small Range Straight Leg Raise - 1 x daily - 7 x weekly - 2 sets - 5 reps Single Leg Stance with Support - 1 x daily - 7 x weekly - 1 sets - 4 reps - 10 hold

## 2020-11-03 NOTE — Therapy (Signed)
[place], [place], [address] Phone: [phone]   Fax:  [phone]  Physical Therapy Treatment / Progress  Patient Details  Name: Lisa Briggs MRN: [id number] Date of Birth: 07-10-1960 Referring Provider (PT): Dene Gentry, MD  Progress Note Reporting Period 09/24/20 to 11/03/2020   See note below for Objective Data and Assessment of Progress/Goals.     Encounter Date: 11/03/2020   PT End of Session - 11/03/20 1021     Visit Number 8    Number of Visits 16    Date for PT Re-Evaluation 11/19/20    Authorization Type Aetna MCR    Authorization Time Period 6th visit FOTO - 10th Visit FOTO/Re-eval    Progress Note Due on Visit 10    PT Start Time 1017    PT Stop Time 1102    PT Time Calculation (min) 45 min    Activity Tolerance Patient tolerated treatment well    Behavior During Therapy WFL for tasks assessed/performed             Past Medical History:  Diagnosis Date   Allergy    Anemia    iron therapy for years as of 10/12; normal Hgb 08/2013   Anxiety    Arthritis    Chest pain 04/05/2011   cardiac eval, normal treadmill stress test, Dr. 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 [name], [facility]   GERD (gastroesophageal reflux disease)    History of uterine fibroid    Hyperlipidemia    Hypertension    Paresthesia 09/2014   initially thought to be TIA, neurology consult in 12/2014 with other non TIA considerations.     Polyarthralgia    normal rheumatoid screen 01/2012   TIA (transient ischemic attack) 2015/16    Past Surgical History:  Procedure Laterality Date   COLONOSCOPY  01/2014   diverticulosis, othwerise normal - Dr. Owens Loffler   ESOPHAGOGASTRODUODENOSCOPY  2013   Dr. Benson Norway, gastritis   ESOPHAGOGASTRODUODENOSCOPY  [id number]    EUS N/A 04/16/2014   Procedure: UPPER ENDOSCOPIC ULTRASOUND (EUS) LINEAR;  Surgeon: Milus Banister, MD;  [facility];  Service: Endoscopy;  Laterality: N/A;   gall stone surgery     KNEE ARTHROSCOPY Left    LAPAROSCOPIC GASTRIC RESECTION N/A 06/09/2014   Procedure: LAPAROSCOPIC GASTRIC MASS RESECTION;  Surgeon: [name], MD;  [facility];  Service: General;  Laterality: N/A;   LIPOMA EXCISION     forehead   LUMBAR LAMINECTOMY N/A 04/25/2019   Procedure: LEFT L2-3 MICRODISCECTOMY, BILATERAL L5-S1 PARTIAL HEMILAMINECTOMY;  Surgeon: Jessy Oto, MD;  Location: [place];  Service: Orthopedics;  Laterality: N/A;   UPPER GASTROINTESTINAL ENDOSCOPY     UTERINE FIBROID SURGERY      There were no vitals filed for this visit.       [facility] PT Assessment - 11/03/20 0001       Assessment   Medical Diagnosis M70.61 (ICD-10-CM) - Trochanteric bursitis of right hip  M25.561 (ICD-10-CM) - Acute pain of right knee    Referring Provider (PT) Hudnall, Sharyn Lull, MD      Sit to Stand   Comments Use of hands bilaterally delay with gait initiation      AROM   Right Knee Extension -2    Right Knee Flexion 127  Left Knee Extension 0    Left Knee Flexion 130    Lumbar Flexion 75%, no use of hands for return to standing    Lumbar Extension 10% pain aggravated    Lumbar - Right Side Bend mid thigh pain aggravated    Lumbar - Left Side Bend mid thigh    Lumbar - Right Rotation 50%    Lumbar - Left Rotation 50%      Ambulation/Gait   Ambulation/Gait Yes    Ambulation/Gait Assistance 6: Modified independent (Device/Increase time)    Assistive device Small based quad cane    Gait Pattern Decreased stance time - right   slight   Gait Comments No right knee brace      Standardized Balance Assessment   Standardized Balance Assessment Five Times Sit to Stand    Five times sit to stand comments  26 sec   no use of hands.                          [facility] Adult PT  Treatment/Exercise - 11/03/20 0001       Lumbar Exercises: Stretches   Hip Flexor Stretch 60 seconds    Hip Flexor Stretch Limitations right    Other Lumbar Stretch Exercise Sitting FF, FF/Rot L 20" x 3      Lumbar Exercises: Aerobic   Nustep L5 x 5 min      Lumbar Exercises: Machines for Strengthening   Leg Press B 35# 10 x 3      Lumbar Exercises: Supine   Pelvic Tilt 10 reps    Pelvic Tilt Limitations 5 sec hold; posterior    Bent Knee Raise 10 reps    Bent Knee Raise Limitations x2; posterior pelvic tilt    Straight Leg Raises Limitations 2 x 5 bilateral; partial range      Lumbar Exercises: Sidelying   Clam Left;Right;20 reps    Clam Limitations red TB      Knee/Hip Exercises: Standing   SLS 10" x 4 each finger tip assist Bilat                    PT Education - 11/03/20 1105     Education Details updated HEP    Person(s) Educated Patient    Methods Explanation;Handout    Comprehension Verbalized understanding              PT Short Term Goals - 10/13/20 1046       PT SHORT TERM GOAL #1   Title The patient will be independent in an initial HEP.    Time 2    Period Weeks    Status Achieved    Target Date 10/08/20               PT Long Term Goals - 11/03/20 1051       PT LONG TERM GOAL #1   Title The patient will have an improved FOTO score of  67% for functional activities.    Baseline 52%    Time 8    Period Weeks    Status On-going      PT LONG TERM GOAL #2   Title The patient will be able to walk using cane for 30 with pain under or equal to 4/10.    Baseline 8/10 pain    Time 8    Period Weeks    Status On-going      PT LONG TERM GOAL #  3   Title The patinet will be able to perform a single leg stance for 10 seconds on each leg for safely with mobility.    Time 8    Period Weeks    Status On-going      PT LONG TERM GOAL #4   Title The patient will be able to perform 5 sit to stand in 20 seconds or less for safety with  walking.    Baseline 47 seconds    Status New    Target Date 11/19/20                   Plan - 11/03/20 1022     Clinical Impression Statement Lisa Briggs continues to have a slow gait pattern with reduction of right stance time and use of cane.  She presents with improvement of sit to stand mechanics and reduced time with 5 sit to stand test.  She is still conitnued limited with functional standard testing of sit to stand, as her time was 26 seconds today. Lisa Briggs's active bilateral knee flexion range of motion improved to 127 to 130 degrees.  The patient has also progressed with improvement of lumbar active range of motion into rotation bilaterally and right side bending.  Therapy was progressed with single leg stance and resistive clam shells.  The patient's HEP was progressed today.  Recommend continued therapy for further strengtheing for functional mobility and activity tolerance.    Personal Factors and Comorbidities Comorbidity 3+    Comorbidities RA, OA, DDD lumbar, and cardiomyopathy    Examination-Activity Limitations Bathing;Locomotion Level;Transfers;Bed Mobility;Caring for Others;Carry;Lift;Stairs;Squat;Stand;Toileting    Examination-Participation Restrictions Shop;Laundry;Meal Prep    PT Treatment/Interventions ADLs/Self Care Home Management;Aquatic Therapy;Electrical Stimulation;Moist Heat;Traction;Gait training;Stair training;Functional mobility training;Therapeutic activities;Therapeutic exercise;Balance training;DME Instruction;Neuromuscular re-education;Manual techniques;Patient/family education;Dry needling;Joint Manipulations;Spinal Manipulations;Taping    PT Next Visit Plan , Balance training, stretches for hip/low back, quad/hip/core stabilization, manual therapy, STS    PT Home Exercise Plan Access Code: [email or web address]    Consulted and Agree with Plan of Care Patient             Patient will benefit from skilled therapeutic intervention in order to improve the following  deficits and impairments:  Abnormal gait, Decreased range of motion, Difficulty walking, Decreased endurance, Decreased activity tolerance, Pain, Decreased balance, Decreased mobility, Decreased strength  Visit Diagnosis: Right knee pain, unspecified chronicity  Difficulty in walking, not elsewhere classified  Chronic bilateral low back pain with bilateral sciatica     Problem List Patient Active Problem List   Diagnosis Date Noted   Prediabetes 02/19/2020   Menopause 11/25/2019   Cardiomyopathy ([place]) 11/19/2019   BMI 38.0-38.9,adult 11/19/2019   Rheumatoid arthritis involving multiple sites with positive rheumatoid factor ([place]) 11/19/2019   Primary osteoarthritis involving multiple joints 11/19/2019   Greater trochanteric bursitis, right 11/19/2019   Decreased activities of daily living (ADL) 11/19/2019   Gait disturbance 11/19/2019   Chronic gastritis without bleeding 11/19/2019   Use of cane as ambulatory aid 11/19/2019   Pain in left shoulder 10/02/2019   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    Class: Chronic   Spinal stenosis of lumbar region 04/25/2019    Class: Chronic   Status post lumbar laminectomy 04/25/2019   Neck pain 11/12/2018   Chronic pain of right knee 09/13/2018   Bruising 09/13/2018   SOB (shortness of breath) 08/30/2018   Chronic pain of left knee 07/08/2018   Chronic hip pain 07/08/2018   Edema 07/08/2018   Left foot pain 04/08/2018  Olecranon bursitis of left elbow 04/05/2018   Leg swelling 12/05/2017   Need for shingles vaccine 12/05/2017   Primary osteoarthritis of both hands 11/23/2017   Rheumatoid factor positive 11/23/2017   Primary osteoarthritis of both knees 11/23/2017   Primary osteoarthritis of both feet 11/23/2017   DDD (degenerative disc disease), lumbar 11/23/2017   Vaccine counseling 08/13/2017   Polyarthralgia 08/13/2017   Joint stiffness 08/13/2017   Sensitive skin 01/23/2017   Need for influenza vaccination  01/23/2017   Proteinuria 01/23/2017   History of gastrointestinal stromal tumor (GIST) 04/20/2016   History of TIA (transient ischemic attack) 04/20/2016   Screening for breast cancer 04/20/2016   Estrogen deficiency 04/20/2016   Chronic maxillary sinusitis 04/20/2016   Impaired fasting blood sugar 04/20/2016   Constipation 04/20/2016   History of fall 04/20/2016   Chronic radicular lumbar pain 01/27/2016   Encounter for health maintenance examination in adult 03/22/2015   Paresthesia 03/22/2015   Cognitive decline 03/22/2015   Screening for cervical cancer 03/22/2015   Vitamin D deficiency 03/22/2015   History of uterine leiomyoma 03/22/2015   Gastroesophageal reflux disease without esophagitis 03/02/2014   Chronic nausea 03/02/2014   Chronic abdominal pain 03/02/2014   Rhinitis, allergic 03/02/2014   Essential hypertension 03/02/2014   Hyperlipidemia 03/02/2014   Obesity with serious comorbidity 01/16/2012   Rich Number, PT, DPT, OCS, Crt. DN  Bethena Midget 11/03/2020, 11:08 AM  [facility] [address] [place], [place], [address] Phone: [phone]   Fax:  [phone]  Name: Lisa Briggs MRN: [id number] Date of Birth: 06/10/1960

## 2020-11-08 ENCOUNTER — Ambulatory Visit: Payer: PRIVATE HEALTH INSURANCE

## 2020-11-10 ENCOUNTER — Telehealth: Payer: Self-pay

## 2020-11-10 NOTE — Telephone Encounter (Signed)
The patient cancelled her appointment today.  She does not have any more visits scheduled.  She reports that a family member passed away.  She will call to schedule when she is ready to return.  Rich Number, PT, DPT, OCS, Crt. DN

## 2020-11-13 ENCOUNTER — Other Ambulatory Visit: Payer: Self-pay | Admitting: Medical

## 2020-11-13 ENCOUNTER — Other Ambulatory Visit: Payer: Self-pay | Admitting: Physician Assistant

## 2020-11-13 DIAGNOSIS — M0579 Rheumatoid arthritis with rheumatoid factor of multiple sites without organ or systems involvement: Secondary | ICD-10-CM

## 2020-11-13 DIAGNOSIS — I429 Cardiomyopathy, unspecified: Secondary | ICD-10-CM

## 2020-11-15 ENCOUNTER — Ambulatory Visit: Payer: PRIVATE HEALTH INSURANCE

## 2020-11-15 ENCOUNTER — Other Ambulatory Visit: Payer: Self-pay

## 2020-11-15 DIAGNOSIS — M6281 Muscle weakness (generalized): Secondary | ICD-10-CM | POA: Diagnosis not present

## 2020-11-15 DIAGNOSIS — M25551 Pain in right hip: Secondary | ICD-10-CM | POA: Diagnosis not present

## 2020-11-15 DIAGNOSIS — M62830 Muscle spasm of back: Secondary | ICD-10-CM | POA: Diagnosis not present

## 2020-11-15 DIAGNOSIS — R262 Difficulty in walking, not elsewhere classified: Secondary | ICD-10-CM | POA: Diagnosis not present

## 2020-11-15 DIAGNOSIS — M25561 Pain in right knee: Secondary | ICD-10-CM

## 2020-11-15 DIAGNOSIS — M5442 Lumbago with sciatica, left side: Secondary | ICD-10-CM | POA: Diagnosis not present

## 2020-11-15 DIAGNOSIS — G8929 Other chronic pain: Secondary | ICD-10-CM | POA: Diagnosis not present

## 2020-11-15 DIAGNOSIS — M5441 Lumbago with sciatica, right side: Secondary | ICD-10-CM | POA: Diagnosis not present

## 2020-11-15 DIAGNOSIS — R600 Localized edema: Secondary | ICD-10-CM | POA: Diagnosis not present

## 2020-11-15 DIAGNOSIS — M25521 Pain in right elbow: Secondary | ICD-10-CM | POA: Diagnosis not present

## 2020-11-15 NOTE — Telephone Encounter (Signed)
Has an appt in september 

## 2020-11-15 NOTE — Therapy (Signed)
[place], [place], [address] Phone: [phone]   Fax:  [phone]  Physical Therapy Treatment / Re-cert  Patient Details  Name: Lisa Briggs MRN: [id number] Date of Birth: [birth year] Referring Provider (PT): Dene Gentry, MD   Encounter Date: 11/15/2020   PT End of Session - 11/15/20 1709     Visit Number 9    Number of Visits 16    Date for PT Re-Evaluation 11/24/20    Authorization Type [employer] MCR    Authorization Time Period 6th visit FOTO - 10th Visit FOTO/Re-eval    PT Start Time 1620    PT Stop Time 1705    PT Time Calculation (min) 45 min    Activity Tolerance Patient tolerated treatment well    Behavior During Therapy [facility] for tasks assessed/performed             Past Medical History:  Diagnosis Date   Allergy    Anemia    iron therapy for years as of 10/12; normal Hgb 08/2013   Anxiety    Arthritis    Chest pain 04/05/2011   cardiac eval, normal treadmill stress test, Dr. 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 [name], [facility]   GERD (gastroesophageal reflux disease)    History of uterine fibroid    Hyperlipidemia    Hypertension    Paresthesia 09/2014   initially thought to be TIA, neurology consult in 12/2014 with other non TIA considerations.     Polyarthralgia    normal rheumatoid screen 01/2012   TIA (transient ischemic attack) 2015/16    Past Surgical History:  Procedure Laterality Date   COLONOSCOPY  01/2014   diverticulosis, othwerise normal - Dr. Owens Loffler   ESOPHAGOGASTRODUODENOSCOPY  2013   Dr. Benson Norway, gastritis   ESOPHAGOGASTRODUODENOSCOPY  580998   EUS N/A 04/16/2014   Procedure: UPPER ENDOSCOPIC ULTRASOUND (EUS) LINEAR;  Surgeon: Milus Banister, MD;  Location: [facility];  Service: Endoscopy;  Laterality:  N/A;   gall stone surgery     KNEE ARTHROSCOPY Left    LAPAROSCOPIC GASTRIC RESECTION N/A 06/09/2014   Procedure: LAPAROSCOPIC GASTRIC MASS RESECTION;  Surgeon: [name], MD;  Location: [facility];  Service: General;  Laterality: N/A;   LIPOMA EXCISION     forehead   LUMBAR LAMINECTOMY N/A 04/25/2019   Procedure: LEFT L2-3 MICRODISCECTOMY, BILATERAL L5-S1 PARTIAL HEMILAMINECTOMY;  Surgeon: Jessy Oto, MD;  Location: [facility];  Service: Orthopedics;  Laterality: N/A;   UPPER GASTROINTESTINAL ENDOSCOPY     UTERINE FIBROID SURGERY      There were no vitals filed for this visit.   Subjective Assessment - 11/15/20 1624     Subjective The patinet reports that she has been going the stretches and exercises at home.    Limitations Lifting;Walking;Standing;House hold activities    How long can you stand comfortably? 30 minutes    How long can you walk comfortably? 20-30 minutes with AD    Diagnostic tests x-rays : right knee: FINDINGS:  Osseous demineralization.     Medial compartment joint space narrowing and spur formation.     Additional degenerative changes at patellofemoral joint.     No acute fracture, dislocation, or bone destruction.     No joint effusion.     IMPRESSION:  Degenerative changes and  osseous demineralization RIGHT knee.    Currently in Pain? Yes    Pain Score 3     Pain Location Knee    Pain Orientation Right                [facility] PT Assessment - 11/15/20 0001       Assessment   Medical Diagnosis M70.61 (ICD-10-CM) - Trochanteric bursitis of right hip  M25.561 (ICD-10-CM) - Acute pain of right knee    Referring Provider (PT) Hudnall, Sharyn Lull, MD      Single Leg Stance   Comments Right 8 seconds, Left 10 seconds      Sit to Stand   Comments no use of hands > time      AROM   Right Knee Extension -2    Right Knee Flexion 127    Left Knee Extension 0    Left Knee Flexion 130    Lumbar Flexion 75%, no use of hands for return to standing    Lumbar  Extension 10% pain aggravated    Lumbar - Right Side Bend mid thigh pain aggravated    Lumbar - Left Side Bend mid thigh    Lumbar - Right Rotation 50%    Lumbar - Left Rotation 50%      Ambulation/Gait   Ambulation/Gait Yes    Ambulation/Gait Assistance 6: Modified independent (Device/Increase time)    Assistive device Small based quad cane    Gait Pattern Decreased stance time - right   slight   Gait Comments No right knee brace      Standardized Balance Assessment   Standardized Balance Assessment Five Times Sit to Stand    Five times sit to stand comments  25 sec   no use of hands.                          [facility] Adult PT Treatment/Exercise - 11/15/20 0001       Lumbar Exercises: Stretches   ITB Stretch 3 reps;20 seconds;Right      Lumbar Exercises: Aerobic   Nustep L5 x 6 min      Lumbar Exercises: Machines for Strengthening   Leg Press B 35# 10 x 3      Lumbar Exercises: Standing   Other Standing Lumbar Exercises Standing hip abd RTB x 10 each    Other Standing Lumbar Exercises Plantigrade hip ext RTB x 10 each      Lumbar Exercises: Seated   Long Arc Quad on Chair Left;Right;15 reps    LAQ on Chair Limitations with ball squeeze      Lumbar Exercises: Supine   Pelvic Tilt 15 reps    Pelvic Tilt Limitations 5 sec hold; posterior    Straight Leg Raises Limitations 2 x 5 bilateral; partial range      Knee/Hip Exercises: Standing   Forward Step Up Left;Right;15 reps;Hand Hold: 1;Step Height: 4"   2 inch step + airex     Knee/Hip Exercises: Seated   Sit to Sand 5 reps;2 sets      Manual Therapy   Manual Therapy Soft tissue mobilization;Taping    Soft tissue mobilization IASTM to the right inferior patella tendon    Kinesiotex Ligament Correction      Kinesiotix   Ligament Correction Left patella superior glide.                      PT Short Term Goals - 10/13/20 1046  PT SHORT TERM GOAL #1   Title The patient will be  independent in an initial HEP.    Time 2    Period Weeks    Status Achieved    Target Date 10/08/20               PT Long Term Goals - 11/15/20 1717       PT LONG TERM GOAL #1   Title The patient will have an improved FOTO score of  67% for functional activities.    Baseline 52%    Time 8    Period Weeks    Status On-going    Target Date 12/13/20      PT LONG TERM GOAL #2   Title The patient will be able to walk using cane for 30 with pain under or equal to 4/10.    Baseline 8/10 pain    Time 8    Period Weeks    Status Partially Met    Target Date 12/13/20      PT LONG TERM GOAL #3   Title The patinet will be able to perform a single leg stance for 10 seconds on each leg for safely with mobility.    Time 8    Period Weeks    Status Partially Met      PT LONG TERM GOAL #4   Title The patient will be able to perform 5 sit to stand in 20 seconds or less for safety with walking.    Baseline 47 seconds    Status Partially Met    Target Date 12/13/20                   Plan - 11/15/20 1710     Clinical Impression Statement Saphire reports that she is doing her exercises at home and she feels that they are helping.  She missed last week due to her mother passing away.  The patient did have an improvement of sit to stand time of 25 seconds.  The patient did report right anterior knee pain after 10 reps of sit to stand.  Performed IASTM and rocktaping to reduce patella pain with CKC knee benting.  The patient does continue to use her [place] for ambulation.  The patient reports walking tolerance of 20-30 minutes with her cane.  Recommend continued therapy for further strengthening and progression of her HEP for functional mobility.    Personal Factors and Comorbidities Comorbidity 3+    Comorbidities RA, OA, DDD lumbar, and cardiomyopathy    Examination-Activity Limitations Bathing;Locomotion Level;Transfers;Bed Mobility;Caring for  Others;Carry;Lift;Stairs;Squat;Stand;Toileting    Examination-Participation Restrictions Shop;Laundry;Meal Prep    Rehab Potential Good    PT Frequency 2x / week    PT Duration 4 weeks    PT Treatment/Interventions ADLs/Self Care Home Management;Aquatic Therapy;Electrical Stimulation;Moist Heat;Traction;Gait training;Stair training;Functional mobility training;Therapeutic activities;Therapeutic exercise;Balance training;DME Instruction;Neuromuscular re-education;Manual techniques;Patient/family education;Dry needling;Joint Manipulations;Spinal Manipulations;Taping    PT Next Visit Plan , Balance training, stretches for hip/low back, quad/hip/core stabilization, manual therapy, STS    PT Home Exercise Plan Access Code: [email or web address]    Consulted and Agree with Plan of Care Patient             Patient will benefit from skilled therapeutic intervention in order to improve the following deficits and impairments:  Abnormal gait, Decreased range of motion, Difficulty walking, Decreased endurance, Decreased activity tolerance, Pain, Decreased balance, Decreased mobility, Decreased strength  Visit Diagnosis: Right knee pain, unspecified chronicity  Difficulty in walking, not elsewhere  classified  Chronic bilateral low back pain with bilateral sciatica     Problem List Patient Active Problem List   Diagnosis Date Noted   Prediabetes 02/19/2020   Menopause 11/25/2019   Cardiomyopathy ([place]) 11/19/2019   BMI 38.0-38.9,adult 11/19/2019   Rheumatoid arthritis involving multiple sites with positive rheumatoid factor ([place]) 11/19/2019   Primary osteoarthritis involving multiple joints 11/19/2019   Greater trochanteric bursitis, right 11/19/2019   Decreased activities of daily living (ADL) 11/19/2019   Gait disturbance 11/19/2019   Chronic gastritis without bleeding 11/19/2019   Use of cane as ambulatory aid 11/19/2019   Pain in left shoulder 10/02/2019   Lumbar disc herniation with radiculopathy  04/25/2019    Class: Chronic   Spinal stenosis of lumbar region 04/25/2019    Class: Chronic   Status post lumbar laminectomy 04/25/2019   Neck pain 11/12/2018   Chronic pain of right knee 09/13/2018   Bruising 09/13/2018   SOB (shortness of breath) 08/30/2018   Chronic pain of left knee 07/08/2018   Chronic hip pain 07/08/2018   Edema 07/08/2018   Left foot pain 04/08/2018   Olecranon bursitis of left elbow 04/05/2018   Leg swelling 12/05/2017   Need for shingles vaccine 12/05/2017   Primary osteoarthritis of both hands 11/23/2017   Rheumatoid factor positive 11/23/2017   Primary osteoarthritis of both knees 11/23/2017   Primary osteoarthritis of both feet 11/23/2017   DDD (degenerative disc disease), lumbar 11/23/2017   Vaccine counseling 08/13/2017   Polyarthralgia 08/13/2017   Joint stiffness 08/13/2017   Sensitive skin 01/23/2017   Need for influenza vaccination 01/23/2017   Proteinuria 01/23/2017   History of gastrointestinal stromal tumor (GIST) 04/20/2016   History of TIA (transient ischemic attack) 04/20/2016   Screening for breast cancer 04/20/2016   Estrogen deficiency 04/20/2016   Chronic maxillary sinusitis 04/20/2016   Impaired fasting blood sugar 04/20/2016   Constipation 04/20/2016   History of fall 04/20/2016   Chronic radicular lumbar pain 01/27/2016   Encounter for health maintenance examination in adult 03/22/2015   Paresthesia 03/22/2015   Cognitive decline 03/22/2015   Screening for cervical cancer 03/22/2015   Vitamin D deficiency 03/22/2015   History of uterine leiomyoma 03/22/2015   Gastroesophageal reflux disease without esophagitis 03/02/2014   Chronic nausea 03/02/2014   Chronic abdominal pain 03/02/2014   Rhinitis, allergic 03/02/2014   Essential hypertension 03/02/2014   Hyperlipidemia 03/02/2014   Obesity with serious comorbidity 01/16/2012   Rich Number, PT, DPT, OCS, Crt. DN Bethena Midget 11/15/2020, 5:20 PM  [facility] [address] [place], [place], [address] Phone: [phone]   Fax:  [phone]  Name: Darnell Stimson MRN: [id number] Date of Birth: November 06, 1960

## 2020-11-18 ENCOUNTER — Ambulatory Visit: Payer: PRIVATE HEALTH INSURANCE | Admitting: Physical Therapy

## 2020-11-18 ENCOUNTER — Other Ambulatory Visit: Payer: Self-pay

## 2020-11-18 DIAGNOSIS — R600 Localized edema: Secondary | ICD-10-CM | POA: Diagnosis not present

## 2020-11-18 DIAGNOSIS — R262 Difficulty in walking, not elsewhere classified: Secondary | ICD-10-CM

## 2020-11-18 DIAGNOSIS — M5441 Lumbago with sciatica, right side: Secondary | ICD-10-CM | POA: Diagnosis not present

## 2020-11-18 DIAGNOSIS — M5442 Lumbago with sciatica, left side: Secondary | ICD-10-CM | POA: Diagnosis not present

## 2020-11-18 DIAGNOSIS — G8929 Other chronic pain: Secondary | ICD-10-CM | POA: Diagnosis not present

## 2020-11-18 DIAGNOSIS — M25551 Pain in right hip: Secondary | ICD-10-CM | POA: Diagnosis not present

## 2020-11-18 DIAGNOSIS — M25521 Pain in right elbow: Secondary | ICD-10-CM | POA: Diagnosis not present

## 2020-11-18 DIAGNOSIS — M25561 Pain in right knee: Secondary | ICD-10-CM | POA: Diagnosis not present

## 2020-11-18 DIAGNOSIS — M62830 Muscle spasm of back: Secondary | ICD-10-CM | POA: Diagnosis not present

## 2020-11-18 DIAGNOSIS — M6281 Muscle weakness (generalized): Secondary | ICD-10-CM | POA: Diagnosis not present

## 2020-11-18 NOTE — Therapy (Signed)
[place], [place], [address] Phone: [phone]   Fax:  [phone]  Physical Therapy Treatment  Patient Details  Name: Lisa Briggs MRN: [id number] Date of Birth: 1961/01/21 Referring Provider (PT): Dene Gentry, MD   Encounter Date: 11/18/2020   PT End of Session - 11/18/20 0951     Visit Number 10    Number of Visits 16    Date for PT Re-Evaluation 11/24/20    Authorization Type Aet[employer] MCR    Authorization Time Period 6th visit FOTO - 10th Visit FOTO/Re-eval    Progress Note Due on Visit 18    PT Start Time 0930    PT Stop Time 1015    PT Time Calculation (min) 45 min    Activity Tolerance Patient tolerated treatment well    Behavior During Therapy [facility] for tasks assessed/performed             Past Medical History:  Diagnosis Date   Allergy    Anemia    iron therapy for years as of 10/12; normal Hgb 08/2013   Anxiety    Arthritis    Chest pain 04/05/2011   cardiac eval, normal treadmill stress test, Dr. 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 [name], [facility]   GERD (gastroesophageal reflux disease)    History of uterine fibroid    Hyperlipidemia    Hypertension    Paresthesia 09/2014   initially thought to be TIA, neurology consult in 12/2014 with other non TIA considerations.     Polyarthralgia    normal rheumatoid screen 01/2012   TIA (transient ischemic attack) 2015/16    Past Surgical History:  Procedure Laterality Date   COLONOSCOPY  01/2014   diverticulosis, othwerise normal - Dr. Owens Loffler   ESOPHAGOGASTRODUODENOSCOPY  2013   Dr. Benson Norway, gastritis   ESOPHAGOGASTRODUODENOSCOPY  [id number]   EUS N/A 04/16/2014   Procedure: UPPER ENDOSCOPIC ULTRASOUND (EUS) LINEAR;  Surgeon: Milus Banister, MD;  [facility];  Service:  Endoscopy;  Laterality: N/A;   gall stone surgery     KNEE ARTHROSCOPY Left    LAPAROSCOPIC GASTRIC RESECTION N/A 06/09/2014   Procedure: LAPAROSCOPIC GASTRIC MASS RESECTION;  Surgeon: [name], MD;  Location[facility];  Service: General;  Laterality: N/A;   LIPOMA EXCISION     forehead   LUMBAR LAMINECTOMY N/A 04/25/2019   Procedure: LEFT L2-3 MICRODISCECTOMY, BILATERAL L5-S1 PARTIAL HEMILAMINECTOMY;  Surgeon: Jessy Oto, MD;  Location: [facility];  Service: Orthopedics;  Laterality: N/A;   UPPER GASTROINTESTINAL ENDOSCOPY     UTERINE FIBROID SURGERY      There were no vitals filed for this visit.   Subjective Assessment - 11/18/20 0952     Subjective Yesterday was having knee pain today but not today.    Currently in Pain? Yes    Pain Score 0-No pain    Pain Location Knee    Pain Score 0    Pain Location Hip    Pain Orientation Right                               [facility] Adult PT Treatment/Exercise - 11/18/20 0001       Lumbar Exercises: Aerobic   Nustep L5 x 6 min  Lumbar Exercises: Machines for Strengthening   Cybex Knee Extension 5# bilateral for concentric and right for eccentric x 10  reps    Cybex Knee Flexion 20# bilateral for concentric and right for eccentric    Leg Press B 45# 10 x 2 , single leg 20# x 10      Lumbar Exercises: Supine   Bridge Limitations x10 partial range      Lumbar Exercises: Sidelying   Hip Abduction 10 reps;Right    Hip Abduction Limitations x 2 sets      Manual Therapy   Kinesiotex Create Space      Kinesiotix   Create Space OA knee tape    Ligament Correction --                      PT Short Term Goals - 10/13/20 1046       PT SHORT TERM GOAL #1   Title The patient will be independent in an initial HEP.    Time 2    Period Weeks    Status Achieved    Target Date 10/08/20               PT Long Term Goals - 11/15/20 1717       PT LONG TERM GOAL #1   Title The patient will  have an improved FOTO score of  67% for functional activities.    Baseline 52%    Time 8    Period Weeks    Status On-going    Target Date 12/13/20      PT LONG TERM GOAL #2   Title The patient will be able to walk using cane for 30 with pain under or equal to 4/10.    Baseline 8/10 pain    Time 8    Period Weeks    Status Partially Met    Target Date 12/13/20      PT LONG TERM GOAL #3   Title The patinet will be able to perform a single leg stance for 10 seconds on each leg for safely with mobility.    Time 8    Period Weeks    Status Partially Met      PT LONG TERM GOAL #4   Title The patient will be able to perform 5 sit to stand in 20 seconds or less for safety with walking.    Baseline 47 seconds    Status Partially Met    Target Date 12/13/20                   Plan - 11/18/20 1124     Clinical Impression Statement Dashonda reports no pain on arrival but had more pain yesterday in her knee and hip. She reports knee tape is okay and wants to try another version of tape that was mentioned on her last visit. Tape was removed and KT to create space in right knee joint was applied. Continued with focus on gym machines and hip/knee strength. Pt voices that she likes to use the gym equipment. She tolerated strengthening without c/o increased pain.    PT Treatment/Interventions ADLs/Self Care Home Management;Aquatic Therapy;Electrical Stimulation;Moist Heat;Traction;Gait training;Stair training;Functional mobility training;Therapeutic activities;Therapeutic exercise;Balance training;DME Instruction;Neuromuscular re-education;Manual techniques;Patient/family education;Dry needling;Joint Manipulations;Spinal Manipulations;Taping    PT Next Visit Plan FOTO status; assess benefit of KT taps, Re-eval ;  Balance training, stretches for hip/low back, quad/hip/core stabilization, manual therapy, STS    PT Home Exercise Plan Access Code: [email or web address]  Patient will benefit  from skilled therapeutic intervention in order to improve the following deficits and impairments:  Abnormal gait, Decreased range of motion, Difficulty walking, Decreased endurance, Decreased activity tolerance, Pain, Decreased balance, Decreased mobility, Decreased strength  Visit Diagnosis: Right knee pain, unspecified chronicity  Difficulty in walking, not elsewhere classified  Pain in right hip     Problem List Patient Active Problem List   Diagnosis Date Noted   Prediabetes 02/19/2020   Menopause 11/25/2019   Cardiomyopathy ([place]) 11/19/2019   BMI 38.0-38.9,adult 11/19/2019   Rheumatoid arthritis involving multiple sites with positive rheumatoid factor ([place]) 11/19/2019   Primary osteoarthritis involving multiple joints 11/19/2019   Greater trochanteric bursitis, right 11/19/2019   Decreased activities of daily living (ADL) 11/19/2019   Gait disturbance 11/19/2019   Chronic gastritis without bleeding 11/19/2019   Use of cane as ambulatory aid 11/19/2019   Pain in left shoulder 10/02/2019   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    Class: Chronic   Spinal stenosis of lumbar region 04/25/2019    Class: Chronic   Status post lumbar laminectomy 04/25/2019   Neck pain 11/12/2018   Chronic pain of right knee 09/13/2018   Bruising 09/13/2018   SOB (shortness of breath) 08/30/2018   Chronic pain of left knee 07/08/2018   Chronic hip pain 07/08/2018   Edema 07/08/2018   Left foot pain 04/08/2018   Olecranon bursitis of left elbow 04/05/2018   Leg swelling 12/05/2017   Need for shingles vaccine 12/05/2017   Primary osteoarthritis of both hands 11/23/2017   Rheumatoid factor positive 11/23/2017   Primary osteoarthritis of both knees 11/23/2017   Primary osteoarthritis of both feet 11/23/2017   DDD (degenerative disc disease), lumbar 11/23/2017   Vaccine counseling 08/13/2017   Polyarthralgia 08/13/2017   Joint stiffness 08/13/2017   Sensitive skin 01/23/2017    Need for influenza vaccination 01/23/2017   Proteinuria 01/23/2017   History of gastrointestinal stromal tumor (GIST) 04/20/2016   History of TIA (transient ischemic attack) 04/20/2016   Screening for breast cancer 04/20/2016   Estrogen deficiency 04/20/2016   Chronic maxillary sinusitis 04/20/2016   Impaired fasting blood sugar 04/20/2016   Constipation 04/20/2016   History of fall 04/20/2016   Chronic radicular lumbar pain 01/27/2016   Encounter for health maintenance examination in adult 03/22/2015   Paresthesia 03/22/2015   Cognitive decline 03/22/2015   Screening for cervical cancer 03/22/2015   Vitamin D deficiency 03/22/2015   History of uterine leiomyoma 03/22/2015   Gastroesophageal reflux disease without esophagitis 03/02/2014   Chronic nausea 03/02/2014   Chronic abdominal pain 03/02/2014   Rhinitis, allergic 03/02/2014   Essential hypertension 03/02/2014   Hyperlipidemia 03/02/2014   Obesity with serious comorbidity 01/16/2012    Dorene Ar, PTA 11/18/2020, 11:37 AM  [facility] [address] [place], [place], [address] Phone: [phone]   Fax:  [phone]  Name: Liara Holm MRN: [id number] Date of Birth: 1961/03/03

## 2020-11-24 ENCOUNTER — Other Ambulatory Visit: Payer: Self-pay

## 2020-11-24 ENCOUNTER — Ambulatory Visit: Payer: PRIVATE HEALTH INSURANCE

## 2020-11-24 DIAGNOSIS — M6281 Muscle weakness (generalized): Secondary | ICD-10-CM | POA: Diagnosis not present

## 2020-11-24 DIAGNOSIS — M25551 Pain in right hip: Secondary | ICD-10-CM

## 2020-11-24 DIAGNOSIS — M25652 Stiffness of left hip, not elsewhere classified: Secondary | ICD-10-CM

## 2020-11-24 DIAGNOSIS — R600 Localized edema: Secondary | ICD-10-CM

## 2020-11-24 DIAGNOSIS — M62830 Muscle spasm of back: Secondary | ICD-10-CM | POA: Diagnosis not present

## 2020-11-24 DIAGNOSIS — G8929 Other chronic pain: Secondary | ICD-10-CM | POA: Diagnosis not present

## 2020-11-24 DIAGNOSIS — M5441 Lumbago with sciatica, right side: Secondary | ICD-10-CM | POA: Diagnosis not present

## 2020-11-24 DIAGNOSIS — M25522 Pain in left elbow: Secondary | ICD-10-CM

## 2020-11-24 DIAGNOSIS — M25651 Stiffness of right hip, not elsewhere classified: Secondary | ICD-10-CM

## 2020-11-24 DIAGNOSIS — M25561 Pain in right knee: Secondary | ICD-10-CM | POA: Diagnosis not present

## 2020-11-24 DIAGNOSIS — R262 Difficulty in walking, not elsewhere classified: Secondary | ICD-10-CM

## 2020-11-24 DIAGNOSIS — M25521 Pain in right elbow: Secondary | ICD-10-CM | POA: Diagnosis not present

## 2020-11-24 DIAGNOSIS — M5442 Lumbago with sciatica, left side: Secondary | ICD-10-CM | POA: Diagnosis not present

## 2020-11-24 NOTE — Patient Instructions (Signed)
  [email or web address]

## 2020-11-24 NOTE — Therapy (Signed)
[place], [place], [address] Phone: [phone]   Fax:  [phone]  Physical Therapy Treatment  Progress Note Reporting Period 11/15/2020 to 11/24/2020  See note below for Objective Data and Assessment of Progress/Goals.      Patient Details  Name: Lisa Briggs MRN: [id number] Date of Birth: [birth year] Referring Provider (PT): Dene Gentry, MD   Encounter Date: 11/24/2020   PT End of Session - 11/24/20 1129     Visit Number 11    Number of Visits 16    Date for PT Re-Evaluation 12/13/20    Authorization Type [employer] MCR    Authorization Time Period 6th visit FOTO - 10th Visit FOTO/Re-eval    Progress Note Due on Visit 16    PT Start Time 1050    PT Stop Time 1130    PT Time Calculation (min) 40 min    Equipment Utilized During Treatment Gait belt    Activity Tolerance Patient tolerated treatment well    Behavior During Therapy WFL for tasks assessed/performed             Past Medical History:  Diagnosis Date   Allergy    Anemia    iron therapy for years as of 10/12; normal Hgb 08/2013   Anxiety    Arthritis    Chest pain 04/05/2011   cardiac eval, normal treadmill stress test, Dr. 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 [name], [facility]   GERD (gastroesophageal reflux disease)    History of uterine fibroid    Hyperlipidemia    Hypertension    Paresthesia 09/2014   initially thought to be TIA, neurology consult in 12/2014 with other non TIA considerations.     Polyarthralgia    normal rheumatoid screen 01/2012   TIA (transient ischemic attack) 2015/16    Past Surgical History:  Procedure Laterality Date   COLONOSCOPY  01/2014   diverticulosis, othwerise normal - Dr. Owens Loffler   ESOPHAGOGASTRODUODENOSCOPY  2013   Dr. Benson Norway,  gastritis   ESOPHAGOGASTRODUODENOSCOPY  [id number]   EUS N/A 04/16/2014   Procedure: UPPER ENDOSCOPIC ULTRASOUND (EUS) LINEAR;  Surgeon: Milus Banister, MD;  Location: [facility];  Service: Endoscopy;  Laterality: N/A;   gall stone surgery     KNEE ARTHROSCOPY Left    LAPAROSCOPIC GASTRIC RESECTION N/A 06/09/2014   Procedure: LAPAROSCOPIC GASTRIC MASS RESECTION;  Surgeon: [name], MD;  Location: [facility];  Service: General;  Laterality: N/A;   LIPOMA EXCISION     forehead   LUMBAR LAMINECTOMY N/A 04/25/2019   Procedure: LEFT L2-3 MICRODISCECTOMY, BILATERAL L5-S1 PARTIAL HEMILAMINECTOMY;  Surgeon: Jessy Oto, MD;  Location: [place];  Service: Orthopedics;  Laterality: N/A;   UPPER GASTROINTESTINAL ENDOSCOPY     UTERINE FIBROID SURGERY      There were no vitals filed for this visit.   Subjective Assessment - 11/24/20 1047     Subjective Pt reports that her R knee pain isn't hurting too bad today. She also reports that her R hip is "so-so." She adds that her HEP has been going well.    How long can you sit comfortably? 30 minutes due to LBP    How long can you stand comfortably? 30 minutes    How long can you walk comfortably? 15-30 minutes  Diagnostic tests x-rays : right knee: FINDINGS:  Osseous demineralization.     Medial compartment joint space narrowing and spur formation.     Additional degenerative changes at patellofemoral joint.     No acute fracture, dislocation, or bone destruction.     No joint effusion.     IMPRESSION:  Degenerative changes and osseous demineralization RIGHT knee.    Currently in Pain? Yes    Pain Score 4     Pain Location Knee    Pain Orientation Right    Pain Descriptors / Indicators Aching;Burning    Pain Type Chronic pain    Pain Score 5    Pain Location Hip    Pain Orientation Right    Pain Descriptors / Indicators Sore;Sharp    Pain Onset More than a month ago    Pain Frequency Intermittent                [facility] PT Assessment -  11/24/20 0001       Observation/Other Assessments   Focus on Therapeutic Outcomes (FOTO)  44%      Single Leg Stance   Comments Unable to perform BIL      AROM   Lumbar Flexion 75%, painful, use of hands when returning to standing    Lumbar Extension 10% pain aggravated    Lumbar - Right Side Bend mid thigh pain aggravated    Lumbar - Left Side Bend mid thigh    Lumbar - Right Rotation 50%    Lumbar - Left Rotation 50% with pain      Standardized Balance Assessment   Standardized Balance Assessment Five Times Sit to Stand    Five times sit to stand comments  2 stands in 20 seconds, unable to complete due to increased knee pain                           [facility] Adult PT Treatment/Exercise - 11/24/20 0001       Knee/Hip Exercises: Standing   SLS x3 BIL (<3 sec each)      Knee/Hip Exercises: Seated   Other Seated Knee/Hip Exercises Quad set 3x5                    PT Education - 11/24/20 1309     Education Details Updated HEP, discussed pt's lack of progress made thus far in PT, with a discussion about how, if progress is not made by her next re-eval, we may need to consider discharge and return to her PCP to explore additional treatment alternatives.    Person(s) Educated Patient    Methods Explanation;Handout    Comprehension Verbalized understanding              PT Short Term Goals - 10/13/20 1046       PT SHORT TERM GOAL #1   Title The patient will be independent in an initial HEP.    Time 2    Period Weeks    Status Achieved    Target Date 10/08/20               PT Long Term Goals - 11/24/20 1321       PT LONG TERM GOAL #1   Title The patient will have an improved FOTO score of  67% for functional activities.    Baseline 44% (11/24/2020), 52% (eval)    Time 8    Period Weeks    Status On-going  PT LONG TERM GOAL #2   Title The patient will be able to walk using cane for 30 with pain under or equal to 4/10.     Baseline 8/10 pain    Time 8    Period Weeks    Status On-going      PT LONG TERM GOAL #3   Title The patinet will be able to perform a single leg stance for 10 seconds on each leg for safely with mobility.    Baseline <3sec BIL (11/24/2020)    Time 8    Period Weeks    Status On-going      PT LONG TERM GOAL #4   Title The patient will be able to perform 5 sit to stand in 20 seconds or less for safety with walking.    Baseline 2 stands in 20 seconds; unable to complete due to R knee pain (11/24/2020), 47 seconds (eval)    Status On-going                   Plan - 11/24/20 1311     Clinical Impression Statement Due to miscommunication, in the chart, an extensive progress note was performed today in lieu of treatment. The date for re-certification was entered as today's date (11/24/2020); however, the actual re-cert date is [id number]. This has been changed to reflect this in the chart. The pt was upset about this miscommunication and was verbally abrasive throughout the session.  The PT attempted to clarify the mistake and other misunderstandings that the pt had about her number of visits so far and exercises in her HEP. The pt reported that she had never done SL stance before and did not have it in her HEP, although the exercise had previously been added to her HEP and it was assessed at a prior visit. Upon re-assessment of objective data points and FOTO score, the pt has made no improvement in any data point, and has regressed in SL stance and FOTO score. Exercises were performed at the end of the session and the pt's HEP was updated accordingly. She will benefit from completing her POC in PT to address her impairments and return to her prior level of function with less limitation.    Personal Factors and Comorbidities Comorbidity 3+    Comorbidities RA, OA, DDD lumbar, and cardiomyopathy    Examination-Activity Limitations Bathing;Locomotion Level;Transfers;Bed Mobility;Caring for  Others;Carry;Lift;Stairs;Squat;Stand;Toileting    Examination-Participation Restrictions Shop;Laundry;Meal Prep    Stability/Clinical Decision Making Evolving/Moderate complexity    Clinical Decision Making Moderate    Rehab Potential Fair    PT Frequency 2x / week    PT Duration 4 weeks    PT Treatment/Interventions ADLs/Self Care Home Management;Aquatic Therapy;Electrical Stimulation;Moist Heat;Traction;Gait training;Stair training;Functional mobility training;Therapeutic activities;Therapeutic exercise;Balance training;DME Instruction;Neuromuscular re-education;Manual techniques;Patient/family education;Dry needling;Joint Manipulations;Spinal Manipulations;Taping    PT Next Visit Plan Balance training, manual therapy, STS, core/ hip strengthening    PT Home Exercise Plan Access Code: [email or web address]    Consulted and Agree with Plan of Care Patient             Patient will benefit from skilled therapeutic intervention in order to improve the following deficits and impairments:  Abnormal gait, Decreased range of motion, Difficulty walking, Decreased endurance, Decreased activity tolerance, Pain, Decreased balance, Decreased mobility, Decreased strength  Visit Diagnosis: Right knee pain, unspecified chronicity  Difficulty in walking, not elsewhere classified  Pain in right hip  Chronic bilateral low back pain with bilateral sciatica  Muscle weakness (generalized)  Pain in right elbow  Muscle spasm of back  Localized edema  Stiffness of left hip, not elsewhere classified  Stiffness of right hip, not elsewhere classified  Pain in left elbow     Problem List Patient Active Problem List   Diagnosis Date Noted   Prediabetes 02/19/2020   Menopause 11/25/2019   Cardiomyopathy ([place]) 11/19/2019   BMI 38.0-38.9,adult 11/19/2019   Rheumatoid arthritis involving multiple sites with positive rheumatoid factor ([place]) 11/19/2019   Primary osteoarthritis involving multiple joints  11/19/2019   Greater trochanteric bursitis, right 11/19/2019   Decreased activities of daily living (ADL) 11/19/2019   Gait disturbance 11/19/2019   Chronic gastritis without bleeding 11/19/2019   Use of cane as ambulatory aid 11/19/2019   Pain in left shoulder 10/02/2019   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    Class: Chronic   Spinal stenosis of lumbar region 04/25/2019    Class: Chronic   Status post lumbar laminectomy 04/25/2019   Neck pain 11/12/2018   Chronic pain of right knee 09/13/2018   Bruising 09/13/2018   SOB (shortness of breath) 08/30/2018   Chronic pain of left knee 07/08/2018   Chronic hip pain 07/08/2018   Edema 07/08/2018   Left foot pain 04/08/2018   Olecranon bursitis of left elbow 04/05/2018   Leg swelling 12/05/2017   Need for shingles vaccine 12/05/2017   Primary osteoarthritis of both hands 11/23/2017   Rheumatoid factor positive 11/23/2017   Primary osteoarthritis of both knees 11/23/2017   Primary osteoarthritis of both feet 11/23/2017   DDD (degenerative disc disease), lumbar 11/23/2017   Vaccine counseling 08/13/2017   Polyarthralgia 08/13/2017   Joint stiffness 08/13/2017   Sensitive skin 01/23/2017   Need for influenza vaccination 01/23/2017   Proteinuria 01/23/2017   History of gastrointestinal stromal tumor (GIST) 04/20/2016   History of TIA (transient ischemic attack) 04/20/2016   Screening for breast cancer 04/20/2016   Estrogen deficiency 04/20/2016   Chronic maxillary sinusitis 04/20/2016   Impaired fasting blood sugar 04/20/2016   Constipation 04/20/2016   History of fall 04/20/2016   Chronic radicular lumbar pain 01/27/2016   Encounter for health maintenance examination in adult 03/22/2015   Paresthesia 03/22/2015   Cognitive decline 03/22/2015   Screening for cervical cancer 03/22/2015   Vitamin D deficiency 03/22/2015   History of uterine leiomyoma 03/22/2015   Gastroesophageal reflux disease without  esophagitis 03/02/2014   Chronic nausea 03/02/2014   Chronic abdominal pain 03/02/2014   Rhinitis, allergic 03/02/2014   Essential hypertension 03/02/2014   Hyperlipidemia 03/02/2014   Obesity with serious comorbidity 01/16/2012    Vanessa Henrico, PT, DPT 11/24/20 2:25 PM   [facility] [address] [place], [place], [address] Phone: [phone]   Fax:  [phone]  Name: Lisa Briggs MRN: [id number] Date of Birth: [birth year]

## 2020-11-29 ENCOUNTER — Ambulatory Visit: Payer: PRIVATE HEALTH INSURANCE | Admitting: Physical Therapy

## 2020-11-29 ENCOUNTER — Other Ambulatory Visit: Payer: Self-pay

## 2020-11-29 ENCOUNTER — Other Ambulatory Visit: Payer: Self-pay | Admitting: Medical

## 2020-11-29 ENCOUNTER — Other Ambulatory Visit: Payer: Self-pay | Admitting: Physician Assistant

## 2020-11-29 ENCOUNTER — Encounter: Payer: Self-pay | Admitting: Physical Therapy

## 2020-11-29 DIAGNOSIS — R262 Difficulty in walking, not elsewhere classified: Secondary | ICD-10-CM | POA: Diagnosis not present

## 2020-11-29 DIAGNOSIS — M25551 Pain in right hip: Secondary | ICD-10-CM

## 2020-11-29 DIAGNOSIS — M0579 Rheumatoid arthritis with rheumatoid factor of multiple sites without organ or systems involvement: Secondary | ICD-10-CM

## 2020-11-29 DIAGNOSIS — M6281 Muscle weakness (generalized): Secondary | ICD-10-CM | POA: Diagnosis not present

## 2020-11-29 DIAGNOSIS — M25521 Pain in right elbow: Secondary | ICD-10-CM | POA: Diagnosis not present

## 2020-11-29 DIAGNOSIS — M5442 Lumbago with sciatica, left side: Secondary | ICD-10-CM | POA: Diagnosis not present

## 2020-11-29 DIAGNOSIS — M25561 Pain in right knee: Secondary | ICD-10-CM | POA: Diagnosis not present

## 2020-11-29 DIAGNOSIS — R600 Localized edema: Secondary | ICD-10-CM | POA: Diagnosis not present

## 2020-11-29 DIAGNOSIS — M62830 Muscle spasm of back: Secondary | ICD-10-CM | POA: Diagnosis not present

## 2020-11-29 DIAGNOSIS — G8929 Other chronic pain: Secondary | ICD-10-CM | POA: Diagnosis not present

## 2020-11-29 DIAGNOSIS — M5441 Lumbago with sciatica, right side: Secondary | ICD-10-CM | POA: Diagnosis not present

## 2020-11-29 NOTE — Therapy (Signed)
[place], [place], [address] Phone: [phone]   Fax:  [phone]  Physical Therapy Treatment  Patient Details  Name: Lisa Briggs MRN: [id number] Date of Birth: 06/20/1960 Referring Provider (PT): Dene Gentry, MD   Encounter Date: 11/29/2020   PT End of Session - 11/29/20 1023     Visit Number 12    Number of Visits 16    Date for PT Re-Evaluation 12/13/20    Authorization Type Aetna MCR    Authorization Time Period progress note at 19th    Progress Note Due on Visit --    PT Start Time 1021    PT Stop Time 1100    PT Time Calculation (min) 39 min    Activity Tolerance Patient tolerated treatment well    Behavior During Therapy [facility] for tasks assessed/performed             Past Medical History:  Diagnosis Date   Allergy    Anemia    iron therapy for years as of 10/12; normal Hgb 08/2013   Anxiety    Arthritis    Chest pain 04/05/2011   cardiac eval, normal treadmill stress test, Dr. 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 [name], [facility]   GERD (gastroesophageal reflux disease)    History of uterine fibroid    Hyperlipidemia    Hypertension    Paresthesia 09/2014   initially thought to be TIA, neurology consult in 12/2014 with other non TIA considerations.     Polyarthralgia    normal rheumatoid screen 01/2012   TIA (transient ischemic attack) 2015/16    Past Surgical History:  Procedure Laterality Date   COLONOSCOPY  01/2014   diverticulosis, othwerise normal - Dr. Owens Loffler   ESOPHAGOGASTRODUODENOSCOPY  2013   Dr. Benson Norway, gastritis   ESOPHAGOGASTRODUODENOSCOPY  [id number]   EUS N/A 04/16/2014   Procedure: UPPER ENDOSCOPIC ULTRASOUND (EUS) LINEAR;  Surgeon: Milus Banister, MD;  Location: [facility];  Service: Endoscopy;   Laterality: N/A;   gall stone surgery     KNEE ARTHROSCOPY Left    LAPAROSCOPIC GASTRIC RESECTION N/A 06/09/2014   Procedure: LAPAROSCOPIC GASTRIC MASS RESECTION;  Surgeon: [name], MD;  Location: [facility];  Service: General;  Laterality: N/A;   LIPOMA EXCISION     forehead   LUMBAR LAMINECTOMY N/A 04/25/2019   Procedure: LEFT L2-3 MICRODISCECTOMY, BILATERAL L5-S1 PARTIAL HEMILAMINECTOMY;  Surgeon: Jessy Oto, MD;  Location: [place];  Service: Orthopedics;  Laterality: N/A;   UPPER GASTROINTESTINAL ENDOSCOPY     UTERINE FIBROID SURGERY      There were no vitals filed for this visit.   Subjective Assessment - 11/29/20 1023     Currently in Pain? No/denies    Pain Score 0-No pain    Pain Location Knee    Pain Score 0    Pain Location Hip                               [facility] Adult PT Treatment/Exercise - 11/29/20 0001       Lumbar Exercises: Stretches   Active Hamstring Stretch 3 reps;30 seconds    Active Hamstring Stretch Limitations supine with strap , right    Lower Trunk Rotation 10 seconds  Lower Trunk Rotation Limitations 10 reps    Hip Flexor Stretch 3 reps;30 seconds    Hip Flexor Stretch Limitations right      Lumbar Exercises: Aerobic   Nustep L5 x 7 min      Lumbar Exercises: Machines for Strengthening   Cybex Knee Extension 5# bilateral for concentric and right for eccentric x 10  reps    Cybex Knee Flexion 20# bilateral for concentric and right for eccentric    Leg Press B 45# 10 x 2 , single leg right 20# x 15      Lumbar Exercises: Supine   Pelvic Tilt 10 reps    Pelvic Tilt Limitations 5 sec hold; posterior    Straight Leg Raises Limitations 1 x 5 bilateral; partial range   with abdominal brace     Knee/Hip Exercises: Stretches   Active Hamstring Stretch --      Knee/Hip Exercises: Standing   SLS 7-10 sec best bilateral      Knee/Hip Exercises: Seated   Other Seated Knee/Hip Exercises Quad set x 10 with 5 sec hold right                       PT Short Term Goals - 10/13/20 1046       PT SHORT TERM GOAL #1   Title The patient will be independent in an initial HEP.    Time 2    Period Weeks    Status Achieved    Target Date 10/08/20               PT Long Term Goals - 11/24/20 1321       PT LONG TERM GOAL #1   Title The patient will have an improved FOTO score of  67% for functional activities.    Baseline 44% (11/24/2020), 52% (eval)    Time 8    Period Weeks    Status On-going      PT LONG TERM GOAL #2   Title The patient will be able to walk using cane for 30 with pain under or equal to 4/10.    Baseline 8/10 pain    Time 8    Period Weeks    Status On-going      PT LONG TERM GOAL #3   Title The patinet will be able to perform a single leg stance for 10 seconds on each leg for safely with mobility.    Baseline <3sec BIL (11/24/2020)    Time 8    Period Weeks    Status On-going      PT LONG TERM GOAL #4   Title The patient will be able to perform 5 sit to stand in 20 seconds or less for safety with walking.    Baseline 2 stands in 20 seconds; unable to complete due to R knee pain (11/24/2020), 47 seconds (eval)    Status On-going                   Plan - 11/29/20 1057     Clinical Impression Statement Pt arrives reporting no hip or knee pain. She notes tenderness to palpation at anterior right knee and intermittent lateral right hip edema. Continued with hip and knee strength using gym machines. Her SLS time has improved since last visit. She reported fatigue with gym machines but did not c/o pain. Completed hip and knee ROM without increased pain. She did have pain with bed mobility including scooting in hooklying  on mat and reports this increases her back pain due to h/o surgery.    PT Next Visit Plan Balance training, manual therapy, STS, core/ hip strengthening    PT Home Exercise Plan Access Code: [email or web address]             Patient will benefit from  skilled therapeutic intervention in order to improve the following deficits and impairments:  Abnormal gait, Decreased range of motion, Difficulty walking, Decreased endurance, Decreased activity tolerance, Pain, Decreased balance, Decreased mobility, Decreased strength  Visit Diagnosis: Right knee pain, unspecified chronicity  Difficulty in walking, not elsewhere classified  Pain in right hip  Muscle weakness (generalized)     Problem List Patient Active Problem List   Diagnosis Date Noted   Prediabetes 02/19/2020   Menopause 11/25/2019   Cardiomyopathy ([place]) 11/19/2019   BMI 38.0-38.9,adult 11/19/2019   Rheumatoid arthritis involving multiple sites with positive rheumatoid factor ([place]) 11/19/2019   Primary osteoarthritis involving multiple joints 11/19/2019   Greater trochanteric bursitis, right 11/19/2019   Decreased activities of daily living (ADL) 11/19/2019   Gait disturbance 11/19/2019   Chronic gastritis without bleeding 11/19/2019   Use of cane as ambulatory aid 11/19/2019   Pain in left shoulder 10/02/2019   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    Class: Chronic   Spinal stenosis of lumbar region 04/25/2019    Class: Chronic   Status post lumbar laminectomy 04/25/2019   Neck pain 11/12/2018   Chronic pain of right knee 09/13/2018   Bruising 09/13/2018   SOB (shortness of breath) 08/30/2018   Chronic pain of left knee 07/08/2018   Chronic hip pain 07/08/2018   Edema 07/08/2018   Left foot pain 04/08/2018   Olecranon bursitis of left elbow 04/05/2018   Leg swelling 12/05/2017   Need for shingles vaccine 12/05/2017   Primary osteoarthritis of both hands 11/23/2017   Rheumatoid factor positive 11/23/2017   Primary osteoarthritis of both knees 11/23/2017   Primary osteoarthritis of both feet 11/23/2017   DDD (degenerative disc disease), lumbar 11/23/2017   Vaccine counseling 08/13/2017   Polyarthralgia 08/13/2017   Joint stiffness 08/13/2017    Sensitive skin 01/23/2017   Need for influenza vaccination 01/23/2017   Proteinuria 01/23/2017   History of gastrointestinal stromal tumor (GIST) 04/20/2016   History of TIA (transient ischemic attack) 04/20/2016   Screening for breast cancer 04/20/2016   Estrogen deficiency 04/20/2016   Chronic maxillary sinusitis 04/20/2016   Impaired fasting blood sugar 04/20/2016   Constipation 04/20/2016   History of fall 04/20/2016   Chronic radicular lumbar pain 01/27/2016   Encounter for health maintenance examination in adult 03/22/2015   Paresthesia 03/22/2015   Cognitive decline 03/22/2015   Screening for cervical cancer 03/22/2015   Vitamin D deficiency 03/22/2015   History of uterine leiomyoma 03/22/2015   Gastroesophageal reflux disease without esophagitis 03/02/2014   Chronic nausea 03/02/2014   Chronic abdominal pain 03/02/2014   Rhinitis, allergic 03/02/2014   Essential hypertension 03/02/2014   Hyperlipidemia 03/02/2014   Obesity with serious comorbidity 01/16/2012    Dorene Ar, PTA 11/29/2020, 11:30 AM  [place] [facility] [address] [place], [place], [address] Phone: [phone]   Fax:  [phone]  Name: Soliyana Clotfelter MRN: [id number] Date of Birth: 1960-12-05

## 2020-11-29 NOTE — Telephone Encounter (Signed)
Next Visit: 03/15/2021  Last Visit: 10/12/2020  Labs: 10/11/2020 CBC and CMP WNL  Eye exam: 08/05/2020 WNL   Current Dose per office note 10/12/2020: Plaquenil 200 mg 1 tablet by mouth twice daily.   [id number] arthritis involving multiple sites with positive rheumatoid factor   Last Fill: 09/21/2020   Okay to refill Plaquenil?

## 2020-12-01 ENCOUNTER — Encounter: Payer: Self-pay | Admitting: Physical Therapy

## 2020-12-01 ENCOUNTER — Other Ambulatory Visit: Payer: Self-pay

## 2020-12-01 ENCOUNTER — Ambulatory Visit: Payer: PRIVATE HEALTH INSURANCE | Admitting: Physical Therapy

## 2020-12-01 DIAGNOSIS — M6281 Muscle weakness (generalized): Secondary | ICD-10-CM | POA: Diagnosis not present

## 2020-12-01 DIAGNOSIS — M25551 Pain in right hip: Secondary | ICD-10-CM

## 2020-12-01 DIAGNOSIS — R600 Localized edema: Secondary | ICD-10-CM | POA: Diagnosis not present

## 2020-12-01 DIAGNOSIS — M25521 Pain in right elbow: Secondary | ICD-10-CM | POA: Diagnosis not present

## 2020-12-01 DIAGNOSIS — M5442 Lumbago with sciatica, left side: Secondary | ICD-10-CM | POA: Diagnosis not present

## 2020-12-01 DIAGNOSIS — M25561 Pain in right knee: Secondary | ICD-10-CM

## 2020-12-01 DIAGNOSIS — M5441 Lumbago with sciatica, right side: Secondary | ICD-10-CM | POA: Diagnosis not present

## 2020-12-01 DIAGNOSIS — R262 Difficulty in walking, not elsewhere classified: Secondary | ICD-10-CM

## 2020-12-01 DIAGNOSIS — G8929 Other chronic pain: Secondary | ICD-10-CM | POA: Diagnosis not present

## 2020-12-01 DIAGNOSIS — M62830 Muscle spasm of back: Secondary | ICD-10-CM | POA: Diagnosis not present

## 2020-12-01 NOTE — Therapy (Addendum)
[place], [place], [address] Phone: [phone]   Fax:  [phone]  Physical Therapy Treatment/Discharge  Patient Details  Name: Lisa Briggs MRN: [id number] Date of Birth: 19-Jul-1960 Referring Provider (PT): Dene Gentry, MD   Encounter Date: 12/01/2020   PT End of Session - 12/01/20 1210     Visit Number 13    Number of Visits 16    Date for PT Re-Evaluation 12/13/20    Authorization Type [employer] [facility]    Authorization Time Period progress note at 19th    Progress Note Due on Visit 16             Past Medical History:  Diagnosis Date   Allergy    Anemia    iron therapy for years as of 10/12; normal Hgb 08/2013   Anxiety    Arthritis    Chest pain 04/05/2011   cardiac eval, normal treadmill stress test, Dr. 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 [name], [facility]   GERD (gastroesophageal reflux disease)    History of uterine fibroid    Hyperlipidemia    Hypertension    Paresthesia 09/2014   initially thought to be TIA, neurology consult in 12/2014 with other non TIA considerations.     Polyarthralgia    normal rheumatoid screen 01/2012   TIA (transient ischemic attack) 2015/16    Past Surgical History:  Procedure Laterality Date   COLONOSCOPY  01/2014   diverticulosis, othwerise normal - Dr. Owens Loffler   ESOPHAGOGASTRODUODENOSCOPY  2013   Dr. Benson Norway, gastritis   ESOPHAGOGASTRODUODENOSCOPY  [id number]   EUS N/A 04/16/2014   Procedure: UPPER ENDOSCOPIC ULTRASOUND (EUS) LINEAR;  Surgeon: Milus Banister, MD;  Location: [facility];  Service: Endoscopy;  Laterality: N/A;   gall stone surgery     KNEE ARTHROSCOPY Left    LAPAROSCOPIC GASTRIC RESECTION N/A 06/09/2014   Procedure: LAPAROSCOPIC GASTRIC MASS RESECTION;  Surgeon: [name], MD;   Location: [facility];  Service: General;  Laterality: N/A;   LIPOMA EXCISION     forehead   LUMBAR LAMINECTOMY N/A 04/25/2019   Procedure: LEFT L2-3 MICRODISCECTOMY, BILATERAL L5-S1 PARTIAL HEMILAMINECTOMY;  Surgeon: Jessy Oto, MD;  Location: [facility];  Service: Orthopedics;  Laterality: N/A;   UPPER GASTROINTESTINAL ENDOSCOPY     UTERINE FIBROID SURGERY      There were no vitals filed for this visit.   Subjective Assessment - 12/01/20 1149     Currently in Pain? No/denies    Pain Score 0-No pain    Pain Location Hip   and knee   Pain Orientation Right    Pain Descriptors / Indicators Tender    Aggravating Factors  walking , standing activity,    Pain Relieving Factors ice, heat,NSAID tablet                               [facility] Adult PT Treatment/Exercise - 12/01/20 0001       Lumbar Exercises: Stretches   Lower Trunk Rotation 10 seconds    Lower Trunk Rotation Limitations 10 reps      Lumbar Exercises: Aerobic   Nustep L x 7 min LE only      Lumbar Exercises: Machines for Strengthening   Cybex Knee  Extension 5# bilateral for concentric and right for eccentric x 15  reps    Cybex Knee Flexion 20# bilateral for concentric and right for eccentric x 15 reps    Leg Press Bilat 55# 25# single right leg      Lumbar Exercises: Standing   Other Standing Lumbar Exercises 2 way hip 3 yellow band 3 x 10 each bilateral    Other Standing Lumbar Exercises squats at counter with mod cues for technique/ hip hinge      Lumbar Exercises: Supine   Pelvic Tilt 10 reps    Pelvic Tilt Limitations 5 sec hold; posterior    Bridge Limitations x10 partial range    Straight Leg Raise 10 reps   2 sets   Straight Leg Raises Limitations with cues for abdominal brace                      PT Short Term Goals - 10/13/20 1046       PT SHORT TERM GOAL #1   Title The patient will be independent in an initial HEP.    Time 2    Period Weeks    Status Achieved     Target Date 10/08/20               PT Long Term Goals - 11/24/20 1321       PT LONG TERM GOAL #1   Title The patient will have an improved FOTO score of  67% for functional activities.    Baseline 44% (11/24/2020), 52% (eval)    Time 8    Period Weeks    Status On-going      PT LONG TERM GOAL #2   Title The patient will be able to walk using cane for 30 with pain under or equal to 4/10.    Baseline 8/10 pain    Time 8    Period Weeks    Status On-going      PT LONG TERM GOAL #3   Title The patinet will be able to perform a single leg stance for 10 seconds on each leg for safely with mobility.    Baseline <3sec BIL (11/24/2020)    Time 8    Period Weeks    Status On-going      PT LONG TERM GOAL #4   Title The patient will be able to perform 5 sit to stand in 20 seconds or less for safety with walking.    Baseline 2 stands in 20 seconds; unable to complete due to R knee pain (11/24/2020), 47 seconds (eval)    Status On-going                   Plan - 12/01/20 1324     Clinical Impression Statement Pt reports no pain in right knee or hip on arrival to PT. She reports increased pain with activity on feet. 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 Continued with gym machines to increase knee and hip strength. ABle to increased resistance on machines. Pt able to demonstrate increased endurance with hooklying SLR today. She reported feeling good at end of session.    PT Next Visit Plan Balance training, manual therapy, STS, core/ hip strengthening    PT Home Exercise Plan Access Code: [email or web address]             Patient will benefit from skilled therapeutic intervention in order to improve the following deficits and impairments:  Abnormal gait, Decreased range of motion, Difficulty walking, Decreased endurance, Decreased activity tolerance, Pain, Decreased balance, Decreased mobility, Decreased strength  Visit Diagnosis: Right knee pain,  unspecified chronicity  Difficulty in walking, not elsewhere classified  Pain in right hip  Muscle weakness (generalized)     Problem List Patient Active Problem List   Diagnosis Date Noted   Prediabetes 02/19/2020   Menopause 11/25/2019   Cardiomyopathy ([place]) 11/19/2019   BMI 38.0-38.9,adult 11/19/2019   Rheumatoid arthritis involving multiple sites with positive rheumatoid factor ([place]) 11/19/2019   Primary osteoarthritis involving multiple joints 11/19/2019   Greater trochanteric bursitis, right 11/19/2019   Decreased activities of daily living (ADL) 11/19/2019   Gait disturbance 11/19/2019   Chronic gastritis without bleeding 11/19/2019   Use of cane as ambulatory aid 11/19/2019   Pain in left shoulder 10/02/2019   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    Class: Chronic   Spinal stenosis of lumbar region 04/25/2019    Class: Chronic   Status post lumbar laminectomy 04/25/2019   Neck pain 11/12/2018   Chronic pain of right knee 09/13/2018   Bruising 09/13/2018   SOB (shortness of breath) 08/30/2018   Chronic pain of left knee 07/08/2018   Chronic hip pain 07/08/2018   Edema 07/08/2018   Left foot pain 04/08/2018   Olecranon bursitis of left elbow 04/05/2018   Leg swelling 12/05/2017   Need for shingles vaccine 12/05/2017   Primary osteoarthritis of both hands 11/23/2017   Rheumatoid factor positive 11/23/2017   Primary osteoarthritis of both knees 11/23/2017   Primary osteoarthritis of both feet 11/23/2017   DDD (degenerative disc disease), lumbar 11/23/2017   Vaccine counseling 08/13/2017   Polyarthralgia 08/13/2017   Joint stiffness 08/13/2017   Sensitive skin 01/23/2017   Need for influenza vaccination 01/23/2017   Proteinuria 01/23/2017   History of gastrointestinal stromal tumor (GIST) 04/20/2016   History of TIA (transient ischemic attack) 04/20/2016   Screening for breast cancer 04/20/2016   Estrogen deficiency 04/20/2016   Chronic  maxillary sinusitis 04/20/2016   Impaired fasting blood sugar 04/20/2016   Constipation 04/20/2016   History of fall 04/20/2016   Chronic radicular lumbar pain 01/27/2016   Encounter for health maintenance examination in adult 03/22/2015   Paresthesia 03/22/2015   Cognitive decline 03/22/2015   Screening for cervical cancer 03/22/2015   Vitamin D deficiency 03/22/2015   History of uterine leiomyoma 03/22/2015   Gastroesophageal reflux disease without esophagitis 03/02/2014   Chronic nausea 03/02/2014   Chronic abdominal pain 03/02/2014   Rhinitis, allergic 03/02/2014   Essential hypertension 03/02/2014   Hyperlipidemia 03/02/2014   Obesity with serious comorbidity 01/16/2012    Dorene Ar, PTA 12/01/2020, 1:40 PM  PHYSICAL THERAPY DISCHARGE SUMMARY  Visits from Start of Care: 13  Current functional level related to goals / functional outcomes: See goals above   Remaining deficits: Status unknown   Education / Equipment: N/a   Patient agrees to discharge. Patient goals were partially met. Patient is being discharged due to not returning since the last visit.  Gwendolyn Grant, PT, DPT, ATC 01/05/21 3:15 PM  [facility] [address] [place], [place], [address] Phone: [phone]   Fax:  [phone]  Name: Lisa Briggs MRN: [id number] Date of Birth: June 25, 1960

## 2020-12-07 ENCOUNTER — Encounter (HOSPITAL_COMMUNITY): Payer: Self-pay | Admitting: Emergency Medicine

## 2020-12-07 ENCOUNTER — Other Ambulatory Visit: Payer: Self-pay

## 2020-12-07 ENCOUNTER — Ambulatory Visit: Payer: Medicare Other | Admitting: Physical Therapy

## 2020-12-07 ENCOUNTER — Ambulatory Visit (HOSPITAL_COMMUNITY)
Admission: EM | Admit: 2020-12-07 | Discharge: 2020-12-07 | Disposition: A | Discharge status: Home / Self Care | Payer: Medicare Other | Attending: Student | Admitting: Student

## 2020-12-07 DIAGNOSIS — Z1152 Encounter for screening for COVID-19: Secondary | ICD-10-CM | POA: Diagnosis not present

## 2020-12-07 DIAGNOSIS — J069 Acute upper respiratory infection, unspecified: Secondary | ICD-10-CM | POA: Insufficient documentation

## 2020-12-07 MED ORDER — FLUTICASONE PROPIONATE 50 MCG/ACT NA SUSP: 2.0000 | Freq: Every day | NASAL | 2 refills | Status: DC

## 2020-12-07 MED ORDER — BENZONATATE 100 MG PO CAPS: 100.0000 mg | ORAL_CAPSULE | Freq: Three times a day (TID) | ORAL | 0 refills | Status: DC

## 2020-12-07 NOTE — Discharge Instructions (Addendum)
-  Flonase nasal steroid: place 2 sprays into both nostrils in the morning and at bedtime for at least 7 days. Continue for longer if this is helping. -Tessalon (Benzonatate) as needed for cough. Take one pill up to 3x daily (every 8 hours) -With a virus, you're typically contagious for 5-7 days, or as long as you're having fevers.

## 2020-12-07 NOTE — ED Provider Notes (Signed)
[facility]    CSN: [id number] Arrival date & time: 12/07/20  1633      History   Chief Complaint Chief Complaint  Patient presents with   Cough    HPI Lisa Briggs is a 60 y.o. female presenting with viral symptoms for about 4 days.  Medical history chronic pain, GERD, obesity.  Notes bilateral ear pressure, nonproductive cough, nasal congestion.  Has not tried any medications for the symptoms.  Denies chest pain, shortness of breath, dizziness, weakness.  HPI  Past Medical History:  Diagnosis Date   Allergy    Anemia    iron therapy for years as of 10/12; normal Hgb 08/2013   Anxiety    Arthritis    Chest pain 04/05/2011   cardiac eval, normal treadmill stress test, Dr. 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 [name], [facility]   GERD (gastroesophageal reflux disease)    History of uterine fibroid    Hyperlipidemia    Hypertension    Paresthesia 09/2014   initially thought to be TIA, neurology consult in 12/2014 with other non TIA considerations.     Polyarthralgia    normal rheumatoid screen 01/2012   TIA (transient ischemic attack) 2015/16    Patient Active Problem List   Diagnosis Date Noted   Prediabetes 02/19/2020   Menopause 11/25/2019   Cardiomyopathy ([place]) 11/19/2019   BMI 38.0-38.9,adult 11/19/2019   Rheumatoid arthritis involving multiple sites with positive rheumatoid factor ([place]) 11/19/2019   Primary osteoarthritis involving multiple joints 11/19/2019   Greater trochanteric bursitis, right 11/19/2019   Decreased activities of daily living (ADL) 11/19/2019   Gait disturbance 11/19/2019   Chronic gastritis without bleeding 11/19/2019   Use of cane as ambulatory aid 11/19/2019   Pain in left shoulder 10/02/2019   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    Class: Chronic   Spinal  stenosis of lumbar region 04/25/2019    Class: Chronic   Status post lumbar laminectomy 04/25/2019   Neck pain 11/12/2018   Chronic pain of right knee 09/13/2018   Bruising 09/13/2018   SOB (shortness of breath) 08/30/2018   Chronic pain of left knee 07/08/2018   Chronic hip pain 07/08/2018   Edema 07/08/2018   Left foot pain 04/08/2018   Olecranon bursitis of left elbow 04/05/2018   Leg swelling 12/05/2017   Need for shingles vaccine 12/05/2017   Primary osteoarthritis of both hands 11/23/2017   Rheumatoid factor positive 11/23/2017   Primary osteoarthritis of both knees 11/23/2017   Primary osteoarthritis of both feet 11/23/2017   DDD (degenerative disc disease), lumbar 11/23/2017   Vaccine counseling 08/13/2017   Polyarthralgia 08/13/2017   Joint stiffness 08/13/2017   Sensitive skin 01/23/2017   Need for influenza vaccination 01/23/2017   Proteinuria 01/23/2017   History of gastrointestinal stromal tumor (GIST) 04/20/2016   History of TIA (transient ischemic attack) 04/20/2016   Screening for breast cancer 04/20/2016   Estrogen deficiency 04/20/2016   Chronic maxillary sinusitis 04/20/2016   Impaired fasting blood sugar 04/20/2016   Constipation 04/20/2016   History of fall 04/20/2016   Chronic radicular lumbar pain 01/27/2016   Encounter for health maintenance examination in adult 03/22/2015   Paresthesia 03/22/2015   Cognitive decline 03/22/2015   Screening for cervical cancer 03/22/2015   Vitamin D deficiency 03/22/2015   History of uterine  leiomyoma 03/22/2015   Gastroesophageal reflux disease without esophagitis 03/02/2014   Chronic nausea 03/02/2014   Chronic abdominal pain 03/02/2014   Rhinitis, allergic 03/02/2014   Essential hypertension 03/02/2014   Hyperlipidemia 03/02/2014   Obesity with serious comorbidity 01/16/2012    Past Surgical History:  Procedure Laterality Date   COLONOSCOPY  01/2014   diverticulosis, othwerise normal - Dr. Owens Loffler    ESOPHAGOGASTRODUODENOSCOPY  2013   Dr. Benson Norway, gastritis   ESOPHAGOGASTRODUODENOSCOPY  [id number]   EUS N/A 04/16/2014   Procedure: UPPER ENDOSCOPIC ULTRASOUND (EUS) LINEAR;  Surgeon: Milus Banister, MD;  Location: [facility];  Service: Endoscopy;  Laterality: N/A;   gall stone surgery     KNEE ARTHROSCOPY Left    LAPAROSCOPIC GASTRIC RESECTION N/A 06/09/2014   Procedure: LAPAROSCOPIC GASTRIC MASS RESECTION;  Surgeon: [name], MD;  [facility];  Service: General;  Laterality: N/A;   LIPOMA EXCISION     forehead   LUMBAR LAMINECTOMY N/A 04/25/2019   Procedure: LEFT L2-3 MICRODISCECTOMY, BILATERAL L5-S1 PARTIAL HEMILAMINECTOMY;  Surgeon: Jessy Oto, MD;  Location: [facility];  Service: Orthopedics;  Laterality: N/A;   UPPER GASTROINTESTINAL ENDOSCOPY     UTERINE FIBROID SURGERY      OB History   No obstetric history on file.      Home Medications    Prior to Admission medications   Medication Sig Start Date End Date Taking? Authorizing Provider  benzonatate (TESSALON) 100 MG capsule Take 1 capsule (100 mg total) by mouth every 8 (eight) hours. 12/07/20  Yes Hazel Sams, PA-C  fluticasone (FLONASE) 50 MCG/ACT nasal spray Place 2 sprays into both nostrils daily. 12/07/20  Yes Hazel Sams, PA-C  aspirin EC 81 MG tablet Take 1 tablet (81 mg total) by mouth daily. 07/20/20   Tysinger, Camelia Eng, PA-C  blood glucose meter kit and supplies KIT Test once daily-DX-E11.69 08/05/20   Tysinger, Camelia Eng, PA-C  carvedilol (COREG) 6.25 MG tablet Take 1 tablet (6.25 mg total) by mouth 2 (two) times daily. 08/13/20   Tysinger, Camelia Eng, PA-C  cholecalciferol (VITAMIN D3) 25 MCG (1000 UNIT) tablet Take 1 tablet (1,000 Units total) by mouth daily. 07/20/20   Tysinger, Camelia Eng, PA-C  COVID-19 mRNA Vac-TriS, Pfizer, (PFIZER-BIONT COVID-19 VAC-TRIS) SUSP injection Inject into the muscle. 07/12/20   Carlyle Basques, MD  dexlansoprazole (DEXILANT) 60 MG capsule Take 1 capsule (60 mg total) by mouth daily.  07/20/20   Tysinger, Camelia Eng, PA-C  diclofenac Sodium (VOLTAREN) 1 % GEL APPLY 2 GRAMS TOPICALLY 4 TIMES DAILY 11/29/20   Tysinger, Camelia Eng, PA-C  etodolac (LODINE) 400 MG tablet Take 400 mg by mouth 2 (two) times daily. 09/16/20   [provider]  famotidine (PEPCID) 20 MG tablet TAKE 1 TABLET DAILY AT BEDTIME Patient not taking: Reported on 12/07/2020 07/20/20   Tysinger, Camelia Eng, PA-C  fexofenadine [facility] ALLERGY) 180 MG tablet Take 1 tablet (180 mg total) by mouth daily. 07/20/20   Tysinger, Camelia Eng, PA-C  gabapentin (NEURONTIN) 300 MG capsule Take 1 capsule (300 mg total) by mouth 3 (three) times daily. 07/20/20   Tysinger, Camelia Eng, PA-C  Glucose Blood (BLOOD GLUCOSE TEST STRIPS) STRP Inject 1 each as directed daily. 09/10/20   Tysinger, Camelia Eng, PA-C  hydroxychloroquine (PLAQUENIL) 200 MG tablet TAKE 1 TABLET BY MOUTH TWICE DAILY FOR RHEUMATOID ARTHRITIS 11/29/20   Ofilia Neas, PA-C  Insulin Pen Needle (BD PEN NEEDLE NANO U/F) 32G X 4 MM MISC 1 each by  Does not apply route at bedtime. 07/20/20   Tysinger, Camelia Eng, PA-C  linaclotide [facility]) 145 MCG CAPS capsule Take 1 capsule (145 mcg total) by mouth daily before breakfast. 07/20/20   Tysinger, Camelia Eng, PA-C  Misc. Devices (ROLLATOR ULTRA-LIGHT) MISC Use as directed. 02/05/19   Ofilia Neas, PA-C  ondansetron (ZOFRAN) 4 MG tablet Take 1 tablet (4 mg total) by mouth every 8 (eight) hours as needed for nausea or vomiting. TAKE ONE TABLET BY MOUTH EVERY 8 HOURS AS NEEDED FOR  NAUSEA  OR  VOMITING 09/10/20   Tysinger, Camelia Eng, PA-C  potassium chloride (KLOR-CON) 10 MEQ tablet Take 1 tablet (10 mEq total) by mouth 2 (two) times daily. Patient taking differently: Take 10 mEq by mouth daily. 07/20/20   Tysinger, Camelia Eng, PA-C  sacubitril-valsartan (ENTRESTO) 49-51 MG Take 1 tablet by mouth 2 (two) times daily. 10/20/20 01/18/21  Tolia, Sunit, DO  simvastatin (ZOCOR) 20 MG tablet Take 1 tablet (20 mg total) by mouth daily. 07/20/20   Tysinger, Camelia Eng,  PA-C  spironolactone (ALDACTONE) 25 MG tablet Take 1 tablet by mouth once daily 11/15/20   Tysinger, Camelia Eng, PA-C  venlafaxine XR (EFFEXOR-XR) 37.5 MG 24 hr capsule TAKE 1 CAPSULE DAILY WITH BREAKFAST. 08/13/20   Tysinger, Camelia Eng, PA-C    Family History Family History  Problem Relation Age of Onset   Hypertension Mother    Arthritis Mother    GER disease Mother    Glaucoma Mother    Breast cancer Mother    Stroke Father    Breast cancer Sister        breast cancer dx late 99s   Hypertension Sister    Colon cancer Brother 55   Diabetes Brother    Diabetes Maternal Aunt    Heart disease Maternal Aunt    Heart disease Maternal Grandmother    Heart disease Maternal Grandfather    Heart disease Maternal Uncle    Stroke Maternal Uncle    Leukemia Sister    Hypertension Sister    Healthy Son    Colon polyps Neg Hx    Esophageal cancer Neg Hx    Stomach cancer Neg Hx    Rectal cancer Neg Hx     Social History Social History   Tobacco Use   Smoking status: Never   Smokeless tobacco: Never  Vaping Use   Vaping Use: Never used  Substance Use Topics   Alcohol use: No    Alcohol/week: 0.0 standard drinks   Drug use: No     Allergies   Kiwi extract, Mucinex dm [dm-guaifenesin er], and Peach [prunus persica]   Review of Systems Review of Systems  Constitutional:  Negative for appetite change, chills and fever.  HENT:  Positive for congestion. Negative for ear pain, rhinorrhea, sinus pressure, sinus pain and sore throat.   Eyes:  Negative for redness and visual disturbance.  Respiratory:  Positive for cough. Negative for chest tightness, shortness of breath and wheezing.   Cardiovascular:  Negative for chest pain and palpitations.  Gastrointestinal:  Negative for abdominal pain, constipation, diarrhea, nausea and vomiting.  Genitourinary:  Negative for dysuria, frequency and urgency.  Musculoskeletal:  Negative for myalgias.  Neurological:  Negative for dizziness,  weakness and headaches.  Psychiatric/Behavioral:  Negative for confusion.   All other systems reviewed and are negative.   Physical Exam Triage Vital Signs ED Triage Vitals  Enc Vitals Group     BP      Pulse  Resp      Temp      Temp src      SpO2      Weight      Height      Head Circumference      Peak Flow      Pain Score      Pain Loc      Pain Edu?      Excl. in [place]?    No data found.  Updated Vital Signs BP (!) 82/55 (BP Location: Right Arm) Comment (BP Location): large cuff  Pulse 94   Temp 98.7 F (37.1 C) (Oral)   Resp 20   LMP 02/23/2015 (LMP Unknown)   SpO2 95%   Visual Acuity Right Eye Distance:   Left Eye Distance:   Bilateral Distance:    Right Eye Near:   Left Eye Near:    Bilateral Near:     Physical Exam Vitals reviewed.  Constitutional:      General: She is not in acute distress.    Appearance: Normal appearance. She is obese. She is not ill-appearing.  HENT:     Head: Normocephalic and atraumatic.     Right Ear: Tympanic membrane, ear canal and external ear normal. No tenderness. No middle ear effusion. There is no impacted cerumen. Tympanic membrane is not perforated, erythematous, retracted or bulging.     Left Ear: Tympanic membrane, ear canal and external ear normal. No tenderness.  No middle ear effusion. There is no impacted cerumen. Tympanic membrane is not perforated, erythematous, retracted or bulging.     Nose: Nose normal. No congestion.     Mouth/Throat:     Mouth: Mucous membranes are moist.     Pharynx: Uvula midline. No oropharyngeal exudate or posterior oropharyngeal erythema.  Eyes:     Extraocular Movements: Extraocular movements intact.     Pupils: Pupils are equal, round, and reactive to light.  Cardiovascular:     Rate and Rhythm: Normal rate and regular rhythm.     Heart sounds: Normal heart sounds.  Pulmonary:     Effort: Pulmonary effort is normal.     Breath sounds: Normal breath sounds. No decreased  breath sounds, wheezing, rhonchi or rales.  Abdominal:     Palpations: Abdomen is soft.     Tenderness: There is no abdominal tenderness. There is no guarding or rebound.  Neurological:     General: No focal deficit present.     Mental Status: She is alert and oriented to person, place, and time.  Psychiatric:        Mood and Affect: Mood normal.        Behavior: Behavior normal.        Thought Content: Thought content normal.        Judgment: Judgment normal.     UC Treatments / Results  Labs (all labs ordered are listed, but only abnormal results are displayed) Labs Reviewed  SARS CORONAVIRUS 2 (TAT 6-24 HRS)    EKG   Radiology No results found.  Procedures Procedures (including critical care time)  Medications Ordered in UC Medications - No data to display  Initial Impression / Assessment and Plan / UC Course  I have reviewed the triage vital signs and the nursing notes.  Pertinent labs & imaging results that were available during my care of the patient were reviewed by me and considered in my medical decision making (see chart for details).     This patient is a very pleasant  60 y.o. year old female presenting with viral URI. Today this pt is afebrile nontachycardic nontachypneic, oxygenating well on room air, no wheezes rhonchi or rales.   Vaccinated and boosted for covid-19. Covid PCR sent.   Nurse initially incorrectly entered BP as 82/55.  I rechecked this myself and it was 101/70, which is normal range for her.  Patient is without headaches, dizziness, weakness, chest pain, shortness of breath.  Symptomatic relief with Flonase and Tessalon given multiple allergies.  ED return precautions discussed. Patient verbalizes understanding and agreement.    Final Clinical Impressions(s) / UC Diagnoses   Final diagnoses:  Viral URI with cough  Encounter for screening for COVID-19     Discharge Instructions      -Flonase nasal steroid: place 2 sprays into  both nostrils in the morning and at bedtime for at least 7 days. Continue for longer if this is helping. -Tessalon (Benzonatate) as needed for cough. Take one pill up to 3x daily (every 8 hours) -With a virus, you're typically contagious for 5-7 days, or as long as you're having fevers.       ED Prescriptions     Medication Sig Dispense Auth. Provider   fluticasone (FLONASE) 50 MCG/ACT nasal spray Place 2 sprays into both nostrils daily. 15 mL Hazel Sams, PA-C   benzonatate (TESSALON) 100 MG capsule Take 1 capsule (100 mg total) by mouth every 8 (eight) hours. 21 capsule Hazel Sams, PA-C      PDMP not reviewed this encounter.   Hazel Sams, PA-C 12/07/20 [id number]

## 2020-12-07 NOTE — ED Triage Notes (Signed)
Started feeling bad Saturday night.  Bilateral ear stuffiness.  Right ear painful.  Patient has a cough, runny nose.

## 2020-12-08 LAB — SARS CORONAVIRUS 2 (TAT 6-24 HRS): SARS Coronavirus 2: POSITIVE — AB

## 2020-12-10 ENCOUNTER — Telehealth (HOSPITAL_COMMUNITY): Payer: Self-pay

## 2020-12-10 ENCOUNTER — Ambulatory Visit: Payer: Medicare Other

## 2020-12-13 ENCOUNTER — Ambulatory Visit: Payer: Medicare Other | Admitting: Physical Therapy

## 2020-12-15 ENCOUNTER — Ambulatory Visit: Payer: Medicare Other | Attending: Family Medicine

## 2020-12-15 ENCOUNTER — Telehealth: Payer: Self-pay

## 2020-12-15 NOTE — Telephone Encounter (Signed)
Left voicemail notifying patient of missed PT appointment. Reminded patient of next scheduled visit and reviewed attendance policy.

## 2020-12-20 ENCOUNTER — Telehealth: Payer: Self-pay | Admitting: Physical Therapy

## 2020-12-20 ENCOUNTER — Ambulatory Visit: Payer: Medicare Other | Admitting: Physical Therapy

## 2020-12-20 NOTE — Telephone Encounter (Signed)
Contacted patient regarding second consecutive no show to PT appointment. Left message regarding attendance policy, therefore future appointments will be canceled at this time. Left clinic phone number to call and reschedule if she would like to continue.

## 2020-12-24 ENCOUNTER — Other Ambulatory Visit: Payer: Self-pay | Admitting: Medical

## 2020-12-24 DIAGNOSIS — I429 Cardiomyopathy, unspecified: Secondary | ICD-10-CM

## 2020-12-24 NOTE — Telephone Encounter (Signed)
Pt has an appt coming up next week

## 2020-12-30 ENCOUNTER — Encounter: Payer: PRIVATE HEALTH INSURANCE | Admitting: Medical

## 2021-01-20 ENCOUNTER — Other Ambulatory Visit: Payer: Self-pay | Admitting: Physician Assistant

## 2021-01-20 ENCOUNTER — Other Ambulatory Visit: Payer: Self-pay | Admitting: Medical

## 2021-01-20 DIAGNOSIS — I429 Cardiomyopathy, unspecified: Secondary | ICD-10-CM

## 2021-01-20 DIAGNOSIS — M0579 Rheumatoid arthritis with rheumatoid factor of multiple sites without organ or systems involvement: Secondary | ICD-10-CM

## 2021-01-21 ENCOUNTER — Other Ambulatory Visit: Payer: Self-pay

## 2021-01-21 DIAGNOSIS — I429 Cardiomyopathy, unspecified: Secondary | ICD-10-CM

## 2021-01-21 MED ORDER — CARVEDILOL 6.25 MG PO TABS: 6.2500 mg | ORAL_TABLET | Freq: Two times a day (BID) | ORAL | 2 refills | Status: DC

## 2021-01-21 NOTE — Telephone Encounter (Signed)
Next Visit: 03/15/2021   Last Visit: 10/12/2020   Labs: 10/11/2020 CBC and CMP WNL   Eye exam: 08/05/2020 WNL    Current Dose per office note 10/12/2020: Plaquenil 200 mg 1 tablet by mouth twice daily.    OP:[id number] arthritis involving multiple sites with positive rheumatoid factor    Last Fill: 11/29/2020    Okay to refill Plaquenil?

## 2021-01-24 ENCOUNTER — Other Ambulatory Visit: Payer: Self-pay | Admitting: Medical

## 2021-01-24 DIAGNOSIS — K2950 Unspecified chronic gastritis without bleeding: Secondary | ICD-10-CM

## 2021-01-27 ENCOUNTER — Other Ambulatory Visit: Payer: Self-pay

## 2021-01-27 ENCOUNTER — Telehealth: Payer: Self-pay

## 2021-01-27 ENCOUNTER — Ambulatory Visit (INDEPENDENT_AMBULATORY_CARE_PROVIDER_SITE_OTHER): Payer: Medicare Other | Admitting: Medical

## 2021-01-27 ENCOUNTER — Other Ambulatory Visit: Payer: Self-pay | Admitting: Medical

## 2021-01-27 VITALS — BP 110/70 | HR 75 | Ht 71.5 in | Wt 248.4 lb

## 2021-01-27 DIAGNOSIS — M25562 Pain in left knee: Secondary | ICD-10-CM

## 2021-01-27 DIAGNOSIS — Z0000 Encounter for general adult medical examination without abnormal findings: Secondary | ICD-10-CM | POA: Diagnosis not present

## 2021-01-27 DIAGNOSIS — M48062 Spinal stenosis, lumbar region with neurogenic claudication: Secondary | ICD-10-CM

## 2021-01-27 DIAGNOSIS — M150 Primary generalized (osteo)arthritis: Secondary | ICD-10-CM

## 2021-01-27 DIAGNOSIS — R7301 Impaired fasting glucose: Secondary | ICD-10-CM

## 2021-01-27 DIAGNOSIS — Z7185 Encounter for immunization safety counseling: Secondary | ICD-10-CM | POA: Diagnosis not present

## 2021-01-27 DIAGNOSIS — Z9989 Dependence on other enabling machines and devices: Secondary | ICD-10-CM | POA: Diagnosis not present

## 2021-01-27 DIAGNOSIS — M5136 Other intervertebral disc degeneration, lumbar region: Secondary | ICD-10-CM

## 2021-01-27 DIAGNOSIS — M19072 Primary osteoarthritis, left ankle and foot: Secondary | ICD-10-CM

## 2021-01-27 DIAGNOSIS — I429 Cardiomyopathy, unspecified: Secondary | ICD-10-CM

## 2021-01-27 DIAGNOSIS — Z9181 History of falling: Secondary | ICD-10-CM

## 2021-01-27 DIAGNOSIS — G8929 Other chronic pain: Secondary | ICD-10-CM

## 2021-01-27 DIAGNOSIS — E559 Vitamin D deficiency, unspecified: Secondary | ICD-10-CM

## 2021-01-27 DIAGNOSIS — M0579 Rheumatoid arthritis with rheumatoid factor of multiple sites without organ or systems involvement: Secondary | ICD-10-CM

## 2021-01-27 DIAGNOSIS — Z8673 Personal history of transient ischemic attack (TIA), and cerebral infarction without residual deficits: Secondary | ICD-10-CM

## 2021-01-27 DIAGNOSIS — J309 Allergic rhinitis, unspecified: Secondary | ICD-10-CM

## 2021-01-27 DIAGNOSIS — M5416 Radiculopathy, lumbar region: Secondary | ICD-10-CM

## 2021-01-27 DIAGNOSIS — Z8509 Personal history of malignant neoplasm of other digestive organs: Secondary | ICD-10-CM

## 2021-01-27 DIAGNOSIS — M51369 Other intervertebral disc degeneration, lumbar region without mention of lumbar back pain or lower extremity pain: Secondary | ICD-10-CM

## 2021-01-27 DIAGNOSIS — M19041 Primary osteoarthritis, right hand: Secondary | ICD-10-CM

## 2021-01-27 DIAGNOSIS — E7800 Pure hypercholesterolemia, unspecified: Secondary | ICD-10-CM

## 2021-01-27 DIAGNOSIS — R110 Nausea: Secondary | ICD-10-CM

## 2021-01-27 DIAGNOSIS — M25561 Pain in right knee: Secondary | ICD-10-CM

## 2021-01-27 DIAGNOSIS — Z7189 Other specified counseling: Secondary | ICD-10-CM

## 2021-01-27 DIAGNOSIS — Z780 Asymptomatic menopausal state: Secondary | ICD-10-CM

## 2021-01-27 DIAGNOSIS — M19071 Primary osteoarthritis, right ankle and foot: Secondary | ICD-10-CM

## 2021-01-27 DIAGNOSIS — Z86018 Personal history of other benign neoplasm: Secondary | ICD-10-CM

## 2021-01-27 DIAGNOSIS — K219 Gastro-esophageal reflux disease without esophagitis: Secondary | ICD-10-CM

## 2021-01-27 DIAGNOSIS — R203 Hyperesthesia: Secondary | ICD-10-CM

## 2021-01-27 DIAGNOSIS — R7303 Prediabetes: Secondary | ICD-10-CM

## 2021-01-27 DIAGNOSIS — Z23 Encounter for immunization: Secondary | ICD-10-CM | POA: Diagnosis not present

## 2021-01-27 DIAGNOSIS — M170 Bilateral primary osteoarthritis of knee: Secondary | ICD-10-CM

## 2021-01-27 DIAGNOSIS — R269 Unspecified abnormalities of gait and mobility: Secondary | ICD-10-CM

## 2021-01-27 DIAGNOSIS — R202 Paresthesia of skin: Secondary | ICD-10-CM

## 2021-01-27 DIAGNOSIS — E2839 Other primary ovarian failure: Secondary | ICD-10-CM

## 2021-01-27 DIAGNOSIS — I10 Essential (primary) hypertension: Secondary | ICD-10-CM

## 2021-01-27 DIAGNOSIS — M19042 Primary osteoarthritis, left hand: Secondary | ICD-10-CM

## 2021-01-27 DIAGNOSIS — K5900 Constipation, unspecified: Secondary | ICD-10-CM

## 2021-01-27 DIAGNOSIS — Z9889 Other specified postprocedural states: Secondary | ICD-10-CM

## 2021-01-27 DIAGNOSIS — M159 Polyosteoarthritis, unspecified: Secondary | ICD-10-CM

## 2021-01-27 MED ORDER — FLUTICASONE PROPIONATE 50 MCG/ACT NA SUSP: 2.0000 | Freq: Every day | NASAL | 2 refills | Status: DC

## 2021-01-27 NOTE — Telephone Encounter (Signed)
Pt. Told me at check out that she also needed a 90 day supply of her Flonase sent in to [place] on [address].

## 2021-01-27 NOTE — Progress Notes (Signed)
Subjective:    Lisa Briggs is a 60 y.o. female who presents for Preventative Services visit and chronic medical problems/med check visit.   Chief Complaint  Patient presents with   fasting cpe    Fasting cpe/ awv and flu shot given today. Obgyn- Dr. Ardis Hughs    Primary Care Provider Tysinger, Camelia Eng, PA-C here for primary care  Current Health Care Team: Dentist, Dr. Gerrit Friends doctor, Dr. Katy Fitch eye Care Medical care team includes: Dr. Basil Dess, orthopedics Dr. Rex Kras, cardiology Dr. Bo Merino and Hazel Sams PA, rheumatology Dr. [name], [facility] Dr. Owens Loffler, GI Neurology, Dr. Narda Amber  Medical Services you may have received from other than [facility] providers in the past year (date may be approximate) Cardiology- Dr. Terri Skains Rheumatology- Dr.Dale Clenton Pare- Dr. Louanne Skye   Exercise Current exercise habits:  walking    Nutrition/Diet Current diet: in general, a "healthy" diet    Depression Screen Depression screen [facility] 2/9 01/27/2021  Decreased Interest 0  Down, Depressed, Hopeless 0  PHQ - 2 Score 0  Some recent data might be hidden    Activities of Daily Living Screen/Functional Status Survey Is the patient deaf or have difficulty hearing?: No Does the patient have difficulty seeing, even when wearing glasses/contacts?: No Does the patient have difficulty concentrating, remembering, or making decisions?: No Does the patient have difficulty walking or climbing stairs?: Yes Does the patient have difficulty dressing or bathing?: No Does the patient have difficulty doing errands alone such as visiting a doctor's office or shopping?: No  Can patient draw a clock face showing 3:15 oclock, yes  Fall Risk Screen Fall Risk  01/27/2021 12/05/2017 10/02/2016 04/28/2016  Falls in the past year? 1 No Yes No  Number falls in past yr: 0 - 1 -  Injury with Fall? 1 - No -  Risk for fall due to : Impaired balance/gait - Other (Comment) -   Follow up Falls evaluation completed - Falls evaluation completed;Education provided;Falls prevention discussed -    Gait Assessment: Normal gait observed yes  Advanced directives Does patient have a [place]? No Does patient have a Living Will? No  Past Medical History:  Diagnosis Date   Allergy    Anemia    iron therapy for years as of 10/12; normal Hgb 08/2013   Anxiety    Arthritis    Chest pain 04/05/2011   cardiac eval, normal treadmill stress test, Dr. 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 [name], [facility]   GERD (gastroesophageal reflux disease)    History of uterine fibroid    Hyperlipidemia    Hypertension    Paresthesia 09/2014   initially thought to be TIA, neurology consult in 12/2014 with other non TIA considerations.     Polyarthralgia    normal rheumatoid screen 01/2012   TIA (transient ischemic attack) 2015/16    Past Surgical History:  Procedure Laterality Date   COLONOSCOPY  01/2014   diverticulosis, othwerise normal - Dr. Owens Loffler   ESOPHAGOGASTRODUODENOSCOPY  2013   Dr. Benson Norway, gastritis   ESOPHAGOGASTRODUODENOSCOPY  [id number]   EUS N/A 04/16/2014   Procedure: UPPER ENDOSCOPIC ULTRASOUND (EUS) LINEAR;  Surgeon: Milus Banister, MD;  Location: [facility];  Service: Endoscopy;  Laterality: N/A;   gall stone surgery  KNEE ARTHROSCOPY Left    LAPAROSCOPIC GASTRIC RESECTION N/A 06/09/2014   Procedure: LAPAROSCOPIC GASTRIC MASS RESECTION;  Surgeon: [name], MD;  Location: [facility];  Service: General;  Laterality: N/A;   LIPOMA EXCISION     forehead   LUMBAR LAMINECTOMY N/A 04/25/2019   Procedure: LEFT L2-3 MICRODISCECTOMY, BILATERAL L5-S1 PARTIAL HEMILAMINECTOMY;  Surgeon: Jessy Oto, MD;  Location: [place];  Service: Orthopedics;  Laterality: N/A;   UPPER GASTROINTESTINAL  ENDOSCOPY     UTERINE FIBROID SURGERY      Social History   Socioeconomic History   Marital status: Married    Spouse name: Not on file   Number of children: 1   Years of education: Not on file   Highest education level: Not on file  Occupational History   Occupation: IT sales professional: [employer]  Tobacco Use   Smoking status: Never   Smokeless tobacco: Never  Vaping Use   Vaping Use: Never used  Substance and Sexual Activity   Alcohol use: No    Alcohol/week: 0.0 standard drinks   Drug use: No   Sexual activity: Not on file  Other Topics Concern   Not on file  Social History Narrative   Married, has 1 son in [place] and some grandchildren.  Glass blower/designer.  Active on job.  Does stretching and exercises daily as per physical therapy.  Works 12 hours daily.     Social Determinants of Health   Financial Resource Strain: Not on file  Food Insecurity: Not on file  Transportation Needs: Not on file  Physical Activity: Not on file  Stress: Not on file  Social Connections: Not on file  Intimate Partner Violence: Not on file    Family History  Problem Relation Age of Onset   Hypertension Mother    Arthritis Mother    GER disease Mother    Glaucoma Mother    Breast cancer Mother    Stroke Father    Breast cancer Sister        breast cancer dx late 106s   Hypertension Sister    Colon cancer Brother 73   Diabetes Brother    Diabetes Maternal Aunt    Heart disease Maternal Aunt    Heart disease Maternal Grandmother    Heart disease Maternal Grandfather    Heart disease Maternal Uncle    Stroke Maternal Uncle    Leukemia Sister    Hypertension Sister    Healthy Son    Colon polyps Neg Hx    Esophageal cancer Neg Hx    Stomach cancer Neg Hx    Rectal cancer Neg Hx      Current Outpatient Medications:    aspirin EC 81 MG tablet, Take 1 tablet (81 mg total) by mouth daily., Disp: 90 tablet, Rfl: 3   blood glucose meter kit and supplies KIT, Test once  daily-DX-E11.69, Disp: 1 each, Rfl: 0   carvedilol (COREG) 6.25 MG tablet, Take 1 tablet (6.25 mg total) by mouth 2 (two) times daily., Disp: 60 tablet, Rfl: 2   cholecalciferol (VITAMIN D3) 25 MCG (1000 UNIT) tablet, Take 1 tablet (1,000 Units total) by mouth daily., Disp: 90 tablet, Rfl: 3   DEXILANT 60 MG capsule, Take 1 capsule by mouth once daily, Disp: 90 capsule, Rfl: 0   diclofenac Sodium (VOLTAREN) 1 % GEL, APPLY 2 GRAMS TOPICALLY 4 TIMES DAILY, Disp: 100 g, Rfl: 0   etodolac (LODINE) 400 MG tablet, Take 400 mg by mouth 2 (  two) times daily., Disp: , Rfl:    famotidine (PEPCID) 20 MG tablet, TAKE 1 TABLET DAILY AT BEDTIME, Disp: 90 tablet, Rfl: 3   fexofenadine (ALLEGRA ALLERGY) 180 MG tablet, Take 1 tablet (180 mg total) by mouth daily., Disp: 90 tablet, Rfl: 3   fluticasone (FLONASE) 50 MCG/ACT nasal spray, Place 2 sprays into both nostrils daily., Disp: 15 mL, Rfl: 2   gabapentin (NEURONTIN) 300 MG capsule, Take 1 capsule (300 mg total) by mouth 3 (three) times daily., Disp: 270 capsule, Rfl: 3   Glucose Blood (BLOOD GLUCOSE TEST STRIPS) STRP, Inject 1 each as directed daily., Disp: 100 strip, Rfl: 5   hydroxychloroquine (PLAQUENIL) 200 MG tablet, TAKE 1 TABLET BY MOUTH TWICE DAILY FOR RHEUMATOID ARTHRITIS, Disp: 180 tablet, Rfl: 0   Insulin Pen Needle (BD PEN NEEDLE NANO U/F) 32G X 4 MM MISC, 1 each by Does not apply route at bedtime., Disp: 100 each, Rfl: 11   linaclotide (LINZESS) 145 MCG CAPS capsule, Take 1 capsule (145 mcg total) by mouth daily before breakfast., Disp: 90 capsule, Rfl: 1   ondansetron (ZOFRAN) 4 MG tablet, Take 1 tablet (4 mg total) by mouth every 8 (eight) hours as needed for nausea or vomiting. TAKE ONE TABLET BY MOUTH EVERY 8 HOURS AS NEEDED FOR  NAUSEA  OR  VOMITING, Disp: 90 tablet, Rfl: 0   potassium chloride (KLOR-CON) 10 MEQ tablet, Take 1 tablet (10 mEq total) by mouth 2 (two) times daily. (Patient taking differently: Take 10 mEq by mouth daily.), Disp: 180  tablet, Rfl: 3   simvastatin (ZOCOR) 20 MG tablet, Take 1 tablet (20 mg total) by mouth daily., Disp: 90 tablet, Rfl: 3   spironolactone (ALDACTONE) 25 MG tablet, Take 1 tablet by mouth once daily, Disp: 30 tablet, Rfl: 0   venlafaxine XR (EFFEXOR-XR) 37.5 MG 24 hr capsule, TAKE 1 CAPSULE DAILY WITH BREAKFAST., Disp: 90 capsule, Rfl: 1   Misc. Devices (ROLLATOR ULTRA-LIGHT) MISC, Use as directed., Disp: 1 each, Rfl: 0  Allergies  Allergen Reactions   Kiwi Extract Hives and Itching   Mucinex Dm [Dm-Guaifenesin Er] Nausea Only    Messes up her stomach. Pt report on 01/21/15   Peach [Prunus Persica] Itching    Peach peeling makes pt itch    History reviewed: allergies, current medications, past family history, past medical history, past social history, past surgical history and problem list  Chronic issues discussed: She reports compliance of medications  Acute issues discussed: none    Objective:     Biometrics BP 110/70   Pulse 75   Ht 5' 11.5" (1.816 m)   Wt 248 lb 6.4 oz (112.7 kg)   LMP 02/23/2015 (LMP Unknown)   BMI 34.16 kg/m   General: Well-developed well-nourished no acute stress, African-American female Alert and oriented x3, answers questions appropriately, can perform simple calculations HEENT: normocephalic, sclerae anicteric Neck: supple, no lymphadenopathy, no thyromegaly, no masses, no bruits Heart: RRR, normal S1, S2, no murmurs Lungs: CTA bilaterally, no wheezes, rhonchi, or rales Abdomen: +bs, soft, non tender, non distended, no masses, no hepatomegaly, no splenomegaly Musculoskeletal: nontender, no swelling, no obvious deformity Extremities: no edema, no cyanosis, no clubbing Pulses: 2+ symmetric, upper and lower extremities, normal cap refill Neurological: alert, oriented x 3, CN2-12 intact, strength normal upper extremities and lower extremities, sensation normal throughout, DTRs 2+ throughout, no cerebellar signs, gait normal Psychiatric: normal  affect, behavior normal, pleasant  Breast/GU-declined, deferred    Assessment:   Encounter Diagnoses  Name  Primary?   Encounter for health maintenance examination in adult Yes   Medicare annual wellness visit, subsequent    Needs flu shot    At moderate risk for fall    Vitamin D deficiency    Vaccine counseling    Use of cane as ambulatory aid    Status post lumbar laminectomy    Spinal stenosis of lumbar region with neurogenic claudication    Sensitive skin    Allergic rhinitis, unspecified seasonality, unspecified trigger    Rheumatoid arthritis involving multiple sites with positive rheumatoid factor (HCC)    Primary osteoarthritis of both knees    Primary osteoarthritis of both hands    Primary osteoarthritis of both feet    Primary osteoarthritis involving multiple joints    Prediabetes    Paresthesia    Menopause    Impaired fasting blood sugar    Pure hypercholesterolemia    History of uterine leiomyoma    History of TIA (transient ischemic attack)    History of gastrointestinal stromal tumor (GIST)    History of fall    Gastroesophageal reflux disease without esophagitis    Gait disturbance    Estrogen deficiency    Essential hypertension    DDD (degenerative disc disease), lumbar    Constipation, unspecified constipation type    Chronic radicular lumbar pain    Chronic pain of right knee    Chronic pain of left knee    Chronic nausea    Cardiomyopathy, unspecified type ([place])    Advance directive discussed with patient      Plan:   A preventative services visit was completed today.  During the course of the visit today, we discussed and counseled about appropriate screening and preventive services.  A health risk assessment was established today that included a review of current medications, allergies, social history, family history, medical and preventative health history, biometrics, and preventative screenings to identify potential safety concerns or  impairments.   This visit was a preventative care visit, also known as wellness visit or routine physical.   Topics typically include healthy lifestyle, diet, exercise, preventative care, vaccinations, sick and well care, proper use of emergency dept and after hours care, as well as other concerns.     Recommendations: Continue to return yearly for your annual wellness and preventative care visits.  This gives [place] a chance to discuss healthy lifestyle, exercise, vaccinations, review your chart record, and perform screenings where appropriate.  I recommend you see your eye doctor yearly for routine vision care.  I recommend you see your dentist yearly for routine dental care including hygiene visits twice yearly.   Vaccination recommendations were reviewed Immunization History  Administered Date(s) Administered   Influenza,inj,Quad PF,6+ Mos 11/24/2013, 01/18/2015, 01/23/2017, 12/05/2017, 12/17/2018, 11/19/2019, 01/27/2021   PFIZER Comirnaty(Gray Top)Covid-19 Tri-Sucrose Vaccine 07/12/2020   PFIZER(Purple Top)SARS-COV-2 Vaccination 06/16/2019, 07/08/2019, 01/15/2020   Pneumococcal Polysaccharide-23 05/21/2015   Tdap 01/17/2011   Zoster Recombinat (Shingrix) 10/05/2017, 12/07/2017    Counseled on the influenza virus vaccine.  Vaccine information sheet given.  Influenza vaccine given after consent obtained.  You are due for Td tetanus vaccine.  Check insurance to see if they cover this.    Screening for cancer: Colon cancer screening: I reviewed your colonoscopy on file that is up to date from 2020.  Plan repeat in 2025.  Breast cancer screening: You should perform a self breast exam monthly.   We reviewed recommendations for regular mammograms and breast cancer screening.  Cervical cancer screening: We  reviewed recommendations for pap smear screening.  Reviewed 11/2019 pap.   Skin cancer screening: Check your skin regularly for new changes, growing lesions, or other lesions of  concern Come in for evaluation if you have skin lesions of concern.  Lung cancer screening: If you have a greater than 20 pack year history of tobacco use, then you may qualify for lung cancer screening with a chest CT scan.   Please call your insurance company to inquire about coverage for this test.  We currently don't have screenings for other cancers besides breast, cervical, colon, and lung cancers.  If you have a strong family history of cancer or have other cancer screening concerns, please let me know.    Bone health: Get at least 150 minutes of aerobic exercise weekly Get weight bearing exercise at least once weekly Bone density test:  A bone density test is an imaging test that uses a type of X-ray to measure the amount of calcium and other minerals in your bones. The test may be used to diagnose or screen you for a condition that causes weak or thin bones (osteoporosis), predict your risk for a broken bone (fracture), or determine how well your osteoporosis treatment is working. The bone density test is recommended for females 58 and older, or females or males <81 if certain risk factors such as thyroid disease, long term use of steroids such as for asthma or rheumatological issues, vitamin D deficiency, estrogen deficiency, family history of osteoporosis, self or family history of fragility fracture in first degree relative.  Ask Dr. Estanislado Pandy about when to do this test.  Do we do it now or wait til age 49.    Heart health: Get at least 150 minutes of aerobic exercise weekly Limit alcohol It is important to maintain a healthy blood pressure and healthy cholesterol numbers  Heart disease screening: Screening for heart disease includes screening for blood pressure, fasting lipids, glucose/diabetes screening, BMI height to weight ratio, reviewed of smoking status, physical activity, and diet.    Goals include blood pressure 120/80 or less, maintaining a healthy lipid/cholesterol  profile, preventing diabetes or keeping diabetes numbers under good control, not smoking or using tobacco products, exercising most days per week or at least 150 minutes per week of exercise, and eating healthy variety of fruits and vegetables, healthy oils, and avoiding unhealthy food choices like fried food, fast food, high sugar and high cholesterol foods.    Follow up routine with Dr. Terri Skains   Medical care options: I recommend you continue to seek care here first for routine care.  We try really hard to have available appointments Monday through Friday daytime hours for sick visits, acute visits, and physicals.  Urgent care should be used for after hours and weekends for significant issues that cannot wait till the next day.  The emergency department should be used for significant potentially life-threatening emergencies.  The emergency department is expensive, can often have long wait times for less significant concerns, so try to utilize primary care, urgent care, or telemedicine when possible to avoid unnecessary trips to the emergency department.  Virtual visits and telemedicine have been introduced since the pandemic started in 2020, and can be convenient ways to receive medical care.  We offer virtual appointments as well to assist you in a variety of options to seek medical care.   Advanced Directives: I recommend you consider completing a [place] and Living Will.   These documents respect your wishes  and help alleviate burdens on your loved ones if you were to become terminally ill or be in a position to need those documents enforced.    You can complete Advanced Directives yourself, have them notarized, then have copies made for our office, for you and for anybody you feel should have them in safe keeping.  Or, you can have an attorney prepare these documents.   If you haven't updated your Last Will and Testament in a while, it may be worthwhile having an attorney  prepare these documents together and save on some costs.        Signifnicat other issues: Vitamin D deficiency-continue supplement  At risk for falls-avoid falls, avoid clutter and tripping hazards, make sure you have lighting in case you need to get in the middle the night to use the restroom.  Rheumatoid arthritis, chronic joint pains -  continue routine follow-up with rheumatology, I reviewed rheumatology notes from July 2023.  She is due at the end of the year for follow-up with rheumatology  Chronic nausea, history of GIST tumor, history of GERD-I recommend follow-up with gastroenterology for surveillance  History of TIA-continue aspirin and cholesterol medicine simvastatin regularly.  Pending labs it would probably be better to be on Lipitor or Crestor for better potency  Prediabetes-updated labs today.  Avoid sweets and junk food and large portions of carbohydrates  Hypertension-continue current medication  Cardiomyopathy-continue routine follow-up with cardiology.  I reviewed her cardiology notes 2022.  At that time she was continued on current medication, things are stable and she was advised to follow-up in January 2023     Ronisha was seen today for fasting cpe.  Diagnoses and all orders for this visit:  Encounter for health maintenance examination in adult -     Lipid panel -     Hemoglobin A1c -     Comprehensive metabolic panel -     CBC with Differential/Platelet  Medicare annual wellness visit, subsequent  Needs flu shot -     Flu Vaccine QUAD 1moIM (Fluarix, Fluzone & Alfiuria Quad PF)  At moderate risk for fall  Vitamin D deficiency  Vaccine counseling  Use of cane as ambulatory aid  Status post lumbar laminectomy  Spinal stenosis of lumbar region with neurogenic claudication  Sensitive skin  Allergic rhinitis, unspecified seasonality, unspecified trigger  Rheumatoid arthritis involving multiple sites with positive rheumatoid factor  (HCC)  Primary osteoarthritis of both knees  Primary osteoarthritis of both hands  Primary osteoarthritis of both feet  Primary osteoarthritis involving multiple joints  Prediabetes -     Hemoglobin A1c  Paresthesia  Menopause  Impaired fasting blood sugar -     Hemoglobin A1c  Pure hypercholesterolemia -     Lipid panel  History of uterine leiomyoma  History of TIA (transient ischemic attack)  History of gastrointestinal stromal tumor (GIST)  History of fall  Gastroesophageal reflux disease without esophagitis  Gait disturbance  Estrogen deficiency  Essential hypertension  DDD (degenerative disc disease), lumbar  Constipation, unspecified constipation type  Chronic radicular lumbar pain  Chronic pain of right knee  Chronic pain of left knee  Chronic nausea -     Comprehensive metabolic panel -     CBC with Differential/Platelet  Cardiomyopathy, unspecified type ([facility]  Advance directive discussed with patient     Medicare Attestation A preventative services visit was completed today.  During the course of the visit the patient was educated and counseled about appropriate screening and preventive services.  A health risk assessment was established with the patient that included a review of current medications, allergies, social history, family history, medical and preventative health history, biometrics, and preventative screenings to identify potential safety concerns or impairments.  A personalized plan was printed today for the patient's records and use.   Personalized health advice and education was given today to reduce health risks and promote self management and wellness.  Information regarding end of life planning was discussed today.  [name], PA-C   01/27/2021

## 2021-01-27 NOTE — Patient Instructions (Signed)
I am sorry to hear about your mother's diagnosis.     This visit was a preventative care visit, also known as wellness visit or routine physical.   Topics typically include healthy lifestyle, diet, exercise, preventative care, vaccinations, sick and well care, proper use of emergency dept and after hours care, as well as other concerns.     Recommendations: Continue to return yearly for your annual wellness and preventative care visits.  This gives [place] a chance to discuss healthy lifestyle, exercise, vaccinations, review your chart record, and perform screenings where appropriate.  I recommend you see your eye doctor yearly for routine vision care.  I recommend you see your dentist yearly for routine dental care including hygiene visits twice yearly.   Vaccination recommendations were reviewed Immunization History  Administered Date(s) Administered   Influenza,inj,Quad PF,6+ Mos 11/24/2013, 01/18/2015, 01/23/2017, 12/05/2017, 12/17/2018, 11/19/2019, 01/27/2021   PFIZER Comirnaty(Gray Top)Covid-19 Tri-Sucrose Vaccine 07/12/2020   PFIZER(Purple Top)SARS-COV-2 Vaccination 06/16/2019, 07/08/2019, 01/15/2020   Pneumococcal Polysaccharide-23 05/21/2015   Tdap 01/17/2011   Zoster Recombinat (Shingrix) 10/05/2017, 12/07/2017    Counseled on the influenza virus vaccine.  Vaccine information sheet given.  Influenza vaccine given after consent obtained.  You are due for Td tetanus vaccine.  Check insurance to see if they cover this.    Screening for cancer: Colon cancer screening: I reviewed your colonoscopy on file that is up to date from 2020.  Plan repeat in 2025.  Breast cancer screening: You should perform a self breast exam monthly.   We reviewed recommendations for regular mammograms and breast cancer screening.  Cervical cancer screening: We reviewed recommendations for pap smear screening.  Reviewed 11/2019 pap.   Skin cancer screening: Check your skin regularly for new  changes, growing lesions, or other lesions of concern Come in for evaluation if you have skin lesions of concern.  Lung cancer screening: If you have a greater than 20 pack year history of tobacco use, then you may qualify for lung cancer screening with a chest CT scan.   Please call your insurance company to inquire about coverage for this test.  We currently don't have screenings for other cancers besides breast, cervical, colon, and lung cancers.  If you have a strong family history of cancer or have other cancer screening concerns, please let me know.    Bone health: Get at least 150 minutes of aerobic exercise weekly Get weight bearing exercise at least once weekly Bone density test:  A bone density test is an imaging test that uses a type of X-ray to measure the amount of calcium and other minerals in your bones. The test may be used to diagnose or screen you for a condition that causes weak or thin bones (osteoporosis), predict your risk for a broken bone (fracture), or determine how well your osteoporosis treatment is working. The bone density test is recommended for females 90 and older, or females or males <35 if certain risk factors such as thyroid disease, long term use of steroids such as for asthma or rheumatological issues, vitamin D deficiency, estrogen deficiency, family history of osteoporosis, self or family history of fragility fracture in first degree relative.  Ask Dr. Estanislado Pandy about when to do this test.  Do we do it now or wait til age 28.    Heart health: Get at least 150 minutes of aerobic exercise weekly Limit alcohol It is important to maintain a healthy blood pressure and healthy cholesterol numbers  Heart disease screening: Screening for heart disease  includes screening for blood pressure, fasting lipids, glucose/diabetes screening, BMI height to weight ratio, reviewed of smoking status, physical activity, and diet.    Goals include blood pressure 120/80 or  less, maintaining a healthy lipid/cholesterol profile, preventing diabetes or keeping diabetes numbers under good control, not smoking or using tobacco products, exercising most days per week or at least 150 minutes per week of exercise, and eating healthy variety of fruits and vegetables, healthy oils, and avoiding unhealthy food choices like fried food, fast food, high sugar and high cholesterol foods.    Follow up routine with Dr. Terri Skains   Medical care options: I recommend you continue to seek care here first for routine care.  We try really hard to have available appointments Monday through Friday daytime hours for sick visits, acute visits, and physicals.  Urgent care should be used for after hours and weekends for significant issues that cannot wait till the next day.  The emergency department should be used for significant potentially life-threatening emergencies.  The emergency department is expensive, can often have long wait times for less significant concerns, so try to utilize primary care, urgent care, or telemedicine when possible to avoid unnecessary trips to the emergency department.  Virtual visits and telemedicine have been introduced since the pandemic started in 2020, and can be convenient ways to receive medical care.  We offer virtual appointments as well to assist you in a variety of options to seek medical care.   Advanced Directives: I recommend you consider completing a [place] and Living Will.   These documents respect your wishes and help alleviate burdens on your loved ones if you were to become terminally ill or be in a position to need those documents enforced.    You can complete Advanced Directives yourself, have them notarized, then have copies made for our office, for you and for anybody you feel should have them in safe keeping.  Or, you can have an attorney prepare these documents.   If you haven't updated your Last Will and Testament in a  while, it may be worthwhile having an attorney prepare these documents together and save on some costs.        Signifnicat other issues: Vitamin D deficiency-continue supplement  At risk for falls-avoid falls, avoid clutter and tripping hazards, make sure you have lighting in case you need to get in the middle the night to use the restroom.  Rheumatoid arthritis, chronic joint pains -  continue routine follow-up with rheumatology, I reviewed rheumatology notes from July 2023.  She is due at the end of the year for follow-up with rheumatology  Chronic nausea, history of GIST tumor, history of GERD-I recommend follow-up with gastroenterology for surveillance  History of TIA-continue aspirin and cholesterol medicine simvastatin regularly.  Pending labs it would probably be better to be on Lipitor or Crestor for better potency  Prediabetes-updated labs today.  Avoid sweets and junk food and large portions of carbohydrates  Hypertension-continue current medication  Cardiomyopathy-continue routine follow-up with cardiology.  I reviewed her cardiology notes 2022.  At that time she was continued on current medication, things are stable and she was advised to follow-up in January 2023

## 2021-01-28 LAB — COMPREHENSIVE METABOLIC PANEL
ALT: 14 IU/L (ref 0–32)
AST: 15 IU/L (ref 0–40)
Albumin/Globulin Ratio: 1.3 (ref 1.2–2.2)
Albumin: 4 g/dL (ref 3.8–4.9)
Alkaline Phosphatase: 146 IU/L — ABNORMAL HIGH (ref 44–121)
BUN/Creatinine Ratio: 14 (ref 9–23)
BUN: 10 mg/dL (ref 6–24)
Bilirubin Total: 0.3 mg/dL (ref 0.0–1.2)
CO2: 26 mmol/L (ref 20–29)
Calcium: 9.4 mg/dL (ref 8.7–10.2)
Chloride: 107 mmol/L — ABNORMAL HIGH (ref 96–106)
Creatinine, Ser: 0.73 mg/dL (ref 0.57–1.00)
Globulin, Total: 3 g/dL (ref 1.5–4.5)
Glucose: 83 mg/dL (ref 70–99)
Potassium: 4.4 mmol/L (ref 3.5–5.2)
Sodium: 145 mmol/L — ABNORMAL HIGH (ref 134–144)
Total Protein: 7 g/dL (ref 6.0–8.5)
eGFR: 95 mL/min/{1.73_m2} (ref >59)

## 2021-01-28 LAB — CBC WITH DIFFERENTIAL/PLATELET
Basophils Absolute: 0 10*3/uL (ref 0.0–0.2)
Basos: 1 %
EOS (ABSOLUTE): 0.1 10*3/uL (ref 0.0–0.4)
Eos: 1 %
Hematocrit: 40.9 % (ref 34.0–46.6)
Hemoglobin: 13.1 g/dL (ref 11.1–15.9)
Immature Grans (Abs): 0 10*3/uL (ref 0.0–0.1)
Immature Granulocytes: 0 %
Lymphocytes Absolute: 2.1 10*3/uL (ref 0.7–3.1)
Lymphs: 34 %
MCH: 27.3 pg (ref 26.6–33.0)
MCHC: 32 g/dL (ref 31.5–35.7)
MCV: 85 fL (ref 79–97)
Monocytes Absolute: 0.4 10*3/uL (ref 0.1–0.9)
Monocytes: 7 %
Neutrophils Absolute: 3.5 10*3/uL (ref 1.4–7.0)
Neutrophils: 57 %
Platelets: 333 10*3/uL (ref 150–450)
RBC: 4.8 x10E6/uL (ref 3.77–5.28)
RDW: 12.7 % (ref 11.7–15.4)
WBC: 6.1 10*3/uL (ref 3.4–10.8)

## 2021-01-28 LAB — HEMOGLOBIN A1C
Est. average glucose Bld gHb Est-mCnc: 117 mg/dL
Hgb A1c MFr Bld: 5.7 % — ABNORMAL HIGH (ref 4.8–5.6)

## 2021-01-28 LAB — LIPID PANEL
Chol/HDL Ratio: 2.3 ratio (ref 0.0–4.4)
Cholesterol, Total: 148 mg/dL (ref 100–199)
HDL: 65 mg/dL (ref >39)
LDL Chol Calc (NIH): 71 mg/dL (ref 0–99)
Triglycerides: 58 mg/dL (ref 0–149)
VLDL Cholesterol Cal: 12 mg/dL (ref 5–40)

## 2021-02-01 ENCOUNTER — Other Ambulatory Visit: Payer: Self-pay | Admitting: Medical

## 2021-02-01 DIAGNOSIS — R110 Nausea: Secondary | ICD-10-CM

## 2021-02-01 DIAGNOSIS — K2950 Unspecified chronic gastritis without bleeding: Secondary | ICD-10-CM

## 2021-02-01 DIAGNOSIS — I429 Cardiomyopathy, unspecified: Secondary | ICD-10-CM

## 2021-02-01 MED ORDER — FLUTICASONE PROPIONATE 50 MCG/ACT NA SUSP: 2.0000 | Freq: Every day | NASAL | 2 refills | Status: DC

## 2021-02-01 MED ORDER — LINACLOTIDE 145 MCG PO CAPS
145.0000 ug | ORAL_CAPSULE | Freq: Every day | ORAL | 1 refills | Status: DC
Start: 2021-02-01 — End: 2022-02-16

## 2021-02-01 MED ORDER — SPIRONOLACTONE 25 MG PO TABS: 25.0000 mg | ORAL_TABLET | Freq: Every day | ORAL | 3 refills | Status: DC

## 2021-02-01 MED ORDER — ROSUVASTATIN CALCIUM 20 MG PO TABS: 20.0000 mg | ORAL_TABLET | Freq: Every day | ORAL | 3 refills | Status: AC

## 2021-02-01 MED ORDER — ONDANSETRON HCL 4 MG PO TABS: 4.0000 mg | ORAL_TABLET | Freq: Three times a day (TID) | ORAL | 0 refills | Status: DC | PRN

## 2021-02-01 MED ORDER — CARVEDILOL 6.25 MG PO TABS
6.2500 mg | ORAL_TABLET | Freq: Two times a day (BID) | ORAL | 3 refills | Status: AC
Start: 2021-02-01 — End: ?

## 2021-02-01 MED ORDER — DEXILANT 60 MG PO CPDR: 1.0000 | DELAYED_RELEASE_CAPSULE | Freq: Every day | ORAL | 3 refills | Status: DC

## 2021-02-01 MED ORDER — VITAMIN D 50 MCG (2000 UT) PO CAPS: 1.0000 | ORAL_CAPSULE | Freq: Every day | ORAL | 3 refills | Status: DC

## 2021-02-01 MED ORDER — VENLAFAXINE HCL ER 37.5 MG PO CP24: ORAL_CAPSULE | ORAL | 1 refills | Status: DC

## 2021-02-02 ENCOUNTER — Other Ambulatory Visit: Payer: Self-pay | Admitting: Medical

## 2021-02-03 NOTE — Telephone Encounter (Signed)
Called pt confirmed she has been raking rx everyday consecutively. Rx refilled

## 2021-02-03 NOTE — Telephone Encounter (Signed)
Left patient a voicemail to return call.

## 2021-02-03 NOTE — Telephone Encounter (Signed)
Call and see if she has been taking losartan.  Her blood pressure look good at her last visit.  If she has not been taking it we can leave this off for now and void refill

## 2021-02-23 ENCOUNTER — Other Ambulatory Visit: Payer: Self-pay | Admitting: Medical

## 2021-02-23 DIAGNOSIS — R110 Nausea: Secondary | ICD-10-CM

## 2021-02-23 NOTE — Telephone Encounter (Signed)
Per shane ok to refill

## 2021-03-02 NOTE — Progress Notes (Signed)
Office Visit Note  [name]             Date of Birth: [birth year]           MRN: [id number]             PCP: Carlena Hurl, PA-C Referring: Carlena Hurl, PA-C Visit Date: 03/15/2021 Occupation: @GUAROCC @  Subjective:  Pain in multiple joints.   History of Present Illness: Lisa Briggs is a 60 y.o. female with a history of rheumatoid arthritis and osteoarthritis.  She has been taking hydroxychloroquine on a regular basis.  She states she continues to have pain and discomfort in her bilateral hands, bilateral knee joints and her feet.  She also has discomfort in the right trochanteric bursa.  She has noticed a knot on her left wrist.  He still has discomfort in her left ankle which had fracture in the past.  Activities of Daily Living:  Patient reports morning stiffness for 1 hour.   Patient Reports nocturnal pain.  Difficulty dressing/grooming: Reports Difficulty climbing stairs: Reports Difficulty getting out of chair: Reports Difficulty using hands for taps, buttons, cutlery, and/or writing: Reports  Review of Systems  Constitutional:  Positive for weight gain. Negative for fatigue, night sweats and weight loss.  HENT:  Negative for mouth sores, trouble swallowing, trouble swallowing, mouth dryness and nose dryness.   Eyes:  Negative for pain, redness, visual disturbance and dryness.  Respiratory:  Negative for cough, shortness of breath and difficulty breathing.   Cardiovascular:  Negative for chest pain, palpitations, hypertension, irregular heartbeat and swelling in legs/feet.  Gastrointestinal:  Positive for constipation. Negative for blood in stool and diarrhea.  Endocrine: Negative for increased urination.  Genitourinary:  Negative for vaginal dryness.  Musculoskeletal:  Positive for joint pain, joint pain, myalgias, morning stiffness and myalgias. Negative for joint swelling, muscle weakness and muscle tenderness.  Skin:  Negative for color change,  rash, hair loss, skin tightness, ulcers and sensitivity to sunlight.  Allergic/Immunologic: Negative for susceptible to infections.  Neurological:  Negative for dizziness, memory loss, night sweats and weakness.  Hematological:  Negative for swollen glands.  Psychiatric/Behavioral:  Positive for sleep disturbance. Negative for depressed mood. The patient is not nervous/anxious.    PMFS History:  Patient Active Problem List   Diagnosis Date Noted   Advance directive discussed with patient 01/27/2021   Medicare annual wellness visit, subsequent 01/27/2021   Needs flu shot 01/27/2021   At moderate risk for fall 01/27/2021   Prediabetes 02/19/2020   Menopause 11/25/2019   Cardiomyopathy ([place]) 11/19/2019   Rheumatoid arthritis involving multiple sites with positive rheumatoid factor ([place]) 11/19/2019   Primary osteoarthritis involving multiple joints 11/19/2019   Greater trochanteric bursitis, right 11/19/2019   Decreased activities of daily living (ADL) 11/19/2019   Gait disturbance 11/19/2019   Use of cane as ambulatory aid 11/19/2019   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    Class: Chronic   Spinal stenosis of lumbar region 04/25/2019    Class: Chronic   Status post lumbar laminectomy 04/25/2019   Chronic pain of right knee 09/13/2018   Chronic pain of left knee 07/08/2018   Chronic hip pain 07/08/2018   Edema 07/08/2018   Need for shingles vaccine 12/05/2017   Primary osteoarthritis of both hands 11/23/2017   Primary osteoarthritis of both knees 11/23/2017   Primary osteoarthritis of both feet 11/23/2017   DDD (degenerative disc disease), lumbar 11/23/2017   Vaccine counseling 08/13/2017   Sensitive skin 01/23/2017  Need for influenza vaccination 01/23/2017   History of gastrointestinal stromal tumor (GIST) 04/20/2016   History of TIA (transient ischemic attack) 04/20/2016   Screening for breast cancer 04/20/2016   Estrogen deficiency 04/20/2016   Impaired  fasting blood sugar 04/20/2016   Constipation 04/20/2016   History of fall 04/20/2016   Chronic radicular lumbar pain 01/27/2016   Encounter for health maintenance examination in adult 03/22/2015   Paresthesia 03/22/2015   Cognitive decline 03/22/2015   Screening for cervical cancer 03/22/2015   Vitamin D deficiency 03/22/2015   History of uterine leiomyoma 03/22/2015   Gastroesophageal reflux disease without esophagitis 03/02/2014   Chronic nausea 03/02/2014   Rhinitis, allergic 03/02/2014   Essential hypertension 03/02/2014   Hyperlipidemia 03/02/2014   Obesity with serious comorbidity 01/16/2012    Past Medical History:  Diagnosis Date   Allergy    Anemia    iron therapy for years as of 10/12; normal Hgb 08/2013   Anxiety    Arthritis    Chest pain 04/05/2011   cardiac eval, normal treadmill stress test, Dr. Tollie Eth   Chronic back pain    Constipation    Farsightedness    wears glasses, Eye care center   Gastrointestinal stromal tumor (GIST) ([place]) 06/2014   Dr. [name], [facility]   GERD (gastroesophageal reflux disease)    History of uterine fibroid    Hyperlipidemia    Hypertension    Paresthesia 09/2014   initially thought to be TIA, neurology consult in 12/2014 with other non TIA considerations.     Polyarthralgia    normal rheumatoid screen 01/2012   TIA (transient ischemic attack) 2015/16    Family History  Problem Relation Age of Onset   Hypertension Mother    Arthritis Mother    GER disease Mother    Glaucoma Mother    Breast cancer Mother    Stroke Father    Breast cancer Sister        breast cancer dx late 6s   Hypertension Sister    Colon cancer Brother 76   Diabetes Brother    Diabetes Maternal Aunt    Heart disease Maternal Aunt    Heart disease Maternal Grandmother    Heart disease Maternal Grandfather    Heart disease Maternal Uncle    Stroke Maternal Uncle    Leukemia Sister    Hypertension Sister    Healthy Son     Colon polyps Neg Hx    Esophageal cancer Neg Hx    Stomach cancer Neg Hx    Rectal cancer Neg Hx    Past Surgical History:  Procedure Laterality Date   COLONOSCOPY  01/2014   diverticulosis, othwerise normal - Dr. Owens Loffler   ESOPHAGOGASTRODUODENOSCOPY  2013   Dr. Benson [place], gastritis   ESOPHAGOGASTRODUODENOSCOPY  [id number]   EUS N/A 04/16/2014   Procedure: UPPER ENDOSCOPIC ULTRASOUND (EUS) LINEAR;  Surgeon: Milus Banister, MD;  Location: [facility];  Service: Endoscopy;  Laterality: N/A;   gall stone surgery     KNEE ARTHROSCOPY Left    LAPAROSCOPIC GASTRIC RESECTION N/A 06/09/2014   Procedure: LAPAROSCOPIC GASTRIC MASS RESECTION;  Surgeon: [name], MD;  Location: [facility];  Service: General;  Laterality: N/A;   LIPOMA EXCISION     forehead   LUMBAR LAMINECTOMY N/A 04/25/2019   Procedure: LEFT L2-3 MICRODISCECTOMY, BILATERAL L5-S1 PARTIAL HEMILAMINECTOMY;  Surgeon: Jessy Oto, MD;  Location: [facility];  Service: Orthopedics;  Laterality: N/A;   UPPER GASTROINTESTINAL ENDOSCOPY  UTERINE FIBROID SURGERY     Social History   Social History Narrative   Married, has 1 son in [place] and some grandchildren.  Glass blower/designer.  Active on job.  Does stretching and exercises daily as per physical therapy.  Works 12 hours daily.     Immunization History  Administered Date(s) Administered   Influenza,inj,Quad PF,6+ Mos 11/24/2013, 01/18/2015, 01/23/2017, 12/05/2017, 12/17/2018, 11/19/2019, 01/27/2021   PFIZER Comirnaty(Gray Top)Covid-19 Tri-Sucrose Vaccine 07/12/2020   PFIZER(Purple Top)SARS-COV-2 Vaccination 06/16/2019, 07/08/2019, 01/15/2020   Pneumococcal Polysaccharide-23 05/21/2015   Tdap 01/17/2011   Zoster Recombinat (Shingrix) 10/05/2017, 12/07/2017     Objective: Vital Signs: BP 125/85 (BP Location: Right Arm, Patient Position: Sitting, Cuff Size: Large)   Pulse 76   Ht 5\' 9"  (1.753 m)   Wt 250 lb (113.4 kg)   LMP 02/23/2015 (LMP Unknown)   BMI 36.92 kg/m     Physical Exam Vitals and nursing note reviewed.  Constitutional:      Appearance: She is well-developed.  HENT:     Head: Normocephalic and atraumatic.  Eyes:     Conjunctiva/sclera: Conjunctivae normal.  Cardiovascular:     Rate and Rhythm: Normal rate and regular rhythm.     Heart sounds: Normal heart sounds.  Pulmonary:     Effort: Pulmonary effort is normal.     Breath sounds: Normal breath sounds.  Abdominal:     General: Bowel sounds are normal.     Palpations: Abdomen is soft.  Musculoskeletal:     Cervical back: Normal range of motion.  Lymphadenopathy:     Cervical: No cervical adenopathy.  Skin:    General: Skin is warm and dry.     Capillary Refill: Capillary refill takes less than 2 seconds.  Neurological:     Mental Status: She is alert and oriented to person, place, and time.  Psychiatric:        Behavior: Behavior normal.     Musculoskeletal Exam: C-spine was in good range of motion.  She had discomfort range of motion of her lumbar spine.  Shoulder joints, elbow joints, wrist joints with good range of motion.  She had bilateral PIP and DIP thickening.  No synovitis was noted over MCPs or PIPs.  She had a ganglion cyst on the volar aspect of her left wrist.  Hip joints and knee joints with good range of motion.  She had tenderness on palpation of the right trochanteric bursa.  There was no tenderness over ankles or MTPs.  CDAI Exam: CDAI Score: 1.4  Patient Global: 2 mm; Provider Global: 2 mm Swollen: 0 ; Tender: 1  Joint Exam 03/15/2021      Right  Left  Knee      Tender     Investigation: No additional findings.  Imaging: No results found.  Recent Labs: Lab Results  Component Value Date   WBC 6.1 01/27/2021   HGB 13.1 01/27/2021   PLT 333 01/27/2021   NA 145 (H) 01/27/2021   K 4.4 01/27/2021   CL 107 (H) 01/27/2021   CO2 26 01/27/2021   GLUCOSE 83 01/27/2021   BUN 10 01/27/2021   CREATININE 0.73 01/27/2021   BILITOT 0.3 01/27/2021    ALKPHOS 146 (H) 01/27/2021   AST 15 01/27/2021   ALT 14 01/27/2021   PROT 7.0 01/27/2021   ALBUMIN 4.0 01/27/2021   CALCIUM 9.4 01/27/2021   GFRAA 115 05/11/2020    Speciality Comments: PLQ Eye Exam: 08/05/2020 WNL @ [facility] Follow up in 1 year  Procedures:  Large Joint Inj: R greater trochanter on 03/15/2021 1:26 PM Indications: pain Details: 27 G 1.5 in needle, lateral approach  Arthrogram: No  Medications: 40 mg triamcinolone acetonide 40 MG/ML; 1.5 mL lidocaine 1 % Aspirate: 0 mL Outcome: tolerated well, no immediate complications Procedure, treatment alternatives, risks and benefits explained, specific risks discussed. Consent was given by the patient. Immediately prior to procedure a time out was called to verify the correct patient, procedure, equipment, support staff and site/side marked as required. Patient was prepped and draped in the usual sterile fashion.    Allergies: Kiwi extract, Mucinex dm [dm-guaifenesin er], and Peach [prunus persica]   Assessment / Plan:     Visit Diagnoses: Rheumatoid arthritis involving multiple sites with positive rheumatoid factor (HCC) - +RF, +CCP: She complains of pain and discomfort in multiple joints.  No synovitis was noted.  I believe most of the discomfort is coming from underlying osteoarthritis.  High risk medication use - Plaquenil 200 mg 1 tablet by mouth twice daily. PLQ Eye Exam: 08/05/2020.Labs from January 27, 2021 were within normal limits.  Information regarding immunization was placed in the AVS.  She was advised to get labs in March and then every 5 months to monitor for drug toxicity.  She will get the annual eye examination.  Primary osteoarthritis of both hands-she has bilateral PIP and DIP thickening with no synovitis.  Ganglion cyst on the volar aspect of the left wrist-natural course of ganglion cyst was discussed.  I advised her to schedule an appointment with the orthopedic surgeon in case her symptoms get worse  and do not improve.  Trochanteric bursitis of both hips-she had tenderness over bilateral trochanteric bursa more on the right side.  Per her request right trochanteric bursa was injected with cortisone as described above.  She tolerated the procedure well.  Postprocedure instructions were given.  Primary osteoarthritis of both knees-she continues to have chronic pain and discomfort in her knee joints.  She has been using a brace which helps.  Primary osteoarthritis of both feet-proper fitting shoes were discussed.  Closed avulsion fracture of left ankle with routine healing, subsequent encounter-she can do history of some discomfort in her left ankle.  DDD (degenerative disc disease), lumbar -she continues to have lower back pain.  She is followed by Dr. Louanne Skye.   Essential hypertension-blood pressure was normal today.  History of hyperlipidemia-need for regular exercise and dietary modifications were discussed.  History of TIA (transient ischemic attack)  Gastroesophageal reflux disease without esophagitis  Vitamin D deficiency-vitamin D level was normal in September 2000 2020.  Osteoporosis screening-she has not had a DEXA scan yet.  We will schedule DEXA scan as she is postmenopausal.  Postmenopausal  Orders: Orders Placed This Encounter  Procedures   Large Joint Inj: R greater trochanter   DG Bone Density    No orders of the defined types were placed in this encounter.    Follow-Up Instructions: Return in about 5 months (around 08/13/2021) for Rheumatoid arthritis, Osteoarthritis.   Bo Merino, MD  Note - This record has been created using Editor, commissioning.  Chart creation errors have been sought, but may not always  have been located. Such creation errors do not reflect on  the standard of medical care.

## 2021-03-15 ENCOUNTER — Ambulatory Visit (INDEPENDENT_AMBULATORY_CARE_PROVIDER_SITE_OTHER): Payer: Medicare Other | Admitting: Rheumatology

## 2021-03-15 ENCOUNTER — Other Ambulatory Visit: Payer: Self-pay

## 2021-03-15 VITALS — BP 125/85 | HR 76 | Ht 69.0 in | Wt 250.0 lb

## 2021-03-15 DIAGNOSIS — Z780 Asymptomatic menopausal state: Secondary | ICD-10-CM

## 2021-03-15 DIAGNOSIS — K219 Gastro-esophageal reflux disease without esophagitis: Secondary | ICD-10-CM

## 2021-03-15 DIAGNOSIS — M7061 Trochanteric bursitis, right hip: Secondary | ICD-10-CM

## 2021-03-15 DIAGNOSIS — Z8673 Personal history of transient ischemic attack (TIA), and cerebral infarction without residual deficits: Secondary | ICD-10-CM

## 2021-03-15 DIAGNOSIS — M0579 Rheumatoid arthritis with rheumatoid factor of multiple sites without organ or systems involvement: Secondary | ICD-10-CM

## 2021-03-15 DIAGNOSIS — M7062 Trochanteric bursitis, left hip: Secondary | ICD-10-CM | POA: Diagnosis not present

## 2021-03-15 DIAGNOSIS — Z13820 Encounter for screening for osteoporosis: Secondary | ICD-10-CM

## 2021-03-15 DIAGNOSIS — Z79899 Other long term (current) drug therapy: Secondary | ICD-10-CM

## 2021-03-15 DIAGNOSIS — M5136 Other intervertebral disc degeneration, lumbar region: Secondary | ICD-10-CM

## 2021-03-15 DIAGNOSIS — M19042 Primary osteoarthritis, left hand: Secondary | ICD-10-CM

## 2021-03-15 DIAGNOSIS — M71122 Other infective bursitis, left elbow: Secondary | ICD-10-CM

## 2021-03-15 DIAGNOSIS — E559 Vitamin D deficiency, unspecified: Secondary | ICD-10-CM

## 2021-03-15 DIAGNOSIS — M19071 Primary osteoarthritis, right ankle and foot: Secondary | ICD-10-CM

## 2021-03-15 DIAGNOSIS — I10 Essential (primary) hypertension: Secondary | ICD-10-CM

## 2021-03-15 DIAGNOSIS — M19041 Primary osteoarthritis, right hand: Secondary | ICD-10-CM

## 2021-03-15 DIAGNOSIS — Z8639 Personal history of other endocrine, nutritional and metabolic disease: Secondary | ICD-10-CM

## 2021-03-15 DIAGNOSIS — M67432 Ganglion, left wrist: Secondary | ICD-10-CM

## 2021-03-15 DIAGNOSIS — M170 Bilateral primary osteoarthritis of knee: Secondary | ICD-10-CM

## 2021-03-15 DIAGNOSIS — M19072 Primary osteoarthritis, left ankle and foot: Secondary | ICD-10-CM

## 2021-03-15 DIAGNOSIS — S82892D Other fracture of left lower leg, subsequent encounter for closed fracture with routine healing: Secondary | ICD-10-CM

## 2021-03-15 MED ORDER — LIDOCAINE HCL 1 % IJ SOLN
1.5000 mL | INTRAMUSCULAR | Status: AC | PRN
  Administered 2021-03-15: 1.5 mL

## 2021-03-15 MED ORDER — TRIAMCINOLONE ACETONIDE 40 MG/ML IJ SUSP
40.0000 mg | INTRAMUSCULAR | Status: AC | PRN
  Administered 2021-03-15: 40 mg via INTRA_ARTICULAR

## 2021-03-15 NOTE — Patient Instructions (Signed)
Standing Labs We placed an order today for your standing lab work.   Please have your standing labs drawn in March  If possible, please have your labs drawn 2 weeks prior to your appointment so that the provider can discuss your results at your appointment.  Please note that you may see your imaging and lab results in MyChart before we have reviewed them. We may be awaiting multiple results to interpret others before contacting you. Please allow our office up to 72 hours to thoroughly review all of the results before contacting the office for clarification of your results.  We have open lab daily: Monday through Thursday from 1:30-4:30 PM and Friday from 1:30-4:00 PM at the office of Dr. Charo Philipp, [facility].   Please be advised, all patients with office appointments requiring lab work will take precedent over walk-in lab work.  If possible, please come for your lab work on Monday and Friday afternoons, as you may experience shorter wait times. The office is located at [address], [address], [place], [place] [address] No appointment is necessary.   Labs are drawn by [facility]. Please bring your co-pay at the time of your lab draw.  You may receive a bill from [facility] for your lab work.  If you wish to have your labs drawn at another location, please call the office 24 hours in advance to send orders.  If you have any questions regarding directions or hours of operation,  please call [phone].   As a reminder, please drink plenty of water prior to coming for your lab work. Thanks!   Vaccines You are taking a medication(s) that can suppress your immune system.  The following immunizations are recommended: Flu annually Covid-19  Td/Tdap (tetanus, diphtheria, pertussis) every 10 years Pneumonia (Prevnar 15 then Pneumovax 23 at least 1 year apart.  Alternatively, can take Prevnar 20 without needing additional dose) Shingrix: 2 doses from 4 weeks to 6 months  apart  Please check with your PCP to make sure you are up to date.  

## 2021-03-16 ENCOUNTER — Telehealth: Payer: Self-pay

## 2021-03-16 ENCOUNTER — Other Ambulatory Visit: Payer: Self-pay | Admitting: Medical

## 2021-03-16 DIAGNOSIS — Z780 Asymptomatic menopausal state: Secondary | ICD-10-CM

## 2021-03-16 DIAGNOSIS — E2839 Other primary ovarian failure: Secondary | ICD-10-CM

## 2021-03-16 NOTE — Telephone Encounter (Signed)
Ok to put order in for dexa scan

## 2021-03-16 NOTE — Telephone Encounter (Signed)
I am not sure who the nurse was that called but Lisa Briggs has put order in so they can schedule the patient.

## 2021-03-16 NOTE — Telephone Encounter (Signed)
Nurse from Dr. Marjory Lies office called wanting to know if we could put in an order for Lisa Briggs to get  DEXA scan at the breast center. They can't do it do to a conflict of interest.

## 2021-03-23 ENCOUNTER — Other Ambulatory Visit: Payer: Self-pay | Admitting: Medical

## 2021-03-31 ENCOUNTER — Telehealth: Payer: Self-pay | Admitting: Medical

## 2021-03-31 ENCOUNTER — Telehealth: Payer: Self-pay | Admitting: Gastroenterology

## 2021-03-31 NOTE — Telephone Encounter (Signed)
Call patient and schedule soon.  Dr. Redmond School got on call message about new vaginal bleeding.

## 2021-03-31 NOTE — Telephone Encounter (Signed)
Patient called in distress.  She has been bleeding for a while.  It was fairly light this past Monday, but today it's "pouring heavy."  She would like someone to call her ASAP and tell her what she should do.  Thank you.

## 2021-03-31 NOTE — Telephone Encounter (Signed)
I returned the pts call.  The pt tells me that she is having vaginal bleeding.  She thought she called her GYN.  I advised her that she called the GI office but I did advise that if she is bleeding heavily she should go to the ED for evaluation.  She thanked me and states she will call her GYN and see what they want her to do.  I again recommended ED if she has heavy bleeding.

## 2021-04-01 ENCOUNTER — Ambulatory Visit: Payer: Medicare Other | Admitting: Family Medicine

## 2021-04-01 NOTE — Telephone Encounter (Signed)
Pt was called yesterday and appt scheduled for this morning

## 2021-04-22 ENCOUNTER — Ambulatory Visit: Payer: Medicare HMO | Admitting: Cardiology

## 2021-05-17 ENCOUNTER — Ambulatory Visit: Payer: Medicare Other | Admitting: Gastroenterology

## 2021-05-21 ENCOUNTER — Other Ambulatory Visit: Payer: Self-pay | Admitting: Rheumatology

## 2021-05-21 DIAGNOSIS — M0579 Rheumatoid arthritis with rheumatoid factor of multiple sites without organ or systems involvement: Secondary | ICD-10-CM

## 2021-05-23 NOTE — Telephone Encounter (Signed)
Next Visit: 08/16/2021  Last Visit: 03/15/2021  Labs: 01/27/2021 Sodium 145, Chloride 107, Alk. Phos 146  Eye exam: 08/05/2020   Current Dose per office note 03/15/2021: Plaquenil 200 mg 1 tablet by mouth twice daily.   UE:[id number] arthritis involving multiple sites with positive rheumatoid factor   Last Fill: 01/21/2021  Okay to refill Plaquenil?

## 2021-05-31 DIAGNOSIS — N950 Postmenopausal bleeding: Secondary | ICD-10-CM | POA: Diagnosis not present

## 2021-06-02 ENCOUNTER — Other Ambulatory Visit: Payer: Self-pay | Admitting: Medical

## 2021-06-02 ENCOUNTER — Other Ambulatory Visit: Payer: Self-pay | Admitting: Rheumatology

## 2021-06-02 DIAGNOSIS — M0579 Rheumatoid arthritis with rheumatoid factor of multiple sites without organ or systems involvement: Secondary | ICD-10-CM

## 2021-06-08 DIAGNOSIS — D250 Submucous leiomyoma of uterus: Secondary | ICD-10-CM | POA: Diagnosis not present

## 2021-06-08 DIAGNOSIS — N950 Postmenopausal bleeding: Secondary | ICD-10-CM | POA: Diagnosis not present

## 2021-06-15 ENCOUNTER — Telehealth: Payer: Self-pay

## 2021-06-15 MED ORDER — FEXOFENADINE HCL 180 MG PO TABS: 180.0000 mg | ORAL_TABLET | Freq: Every day | ORAL | 0 refills | Status: DC

## 2021-06-15 NOTE — Telephone Encounter (Signed)
Pt. Called stating that her dexilant needs another PA and she is about to run out of it so if you could please check on that for her asap. She does not want to run out. She uses [facility] nurse on [address].  ?

## 2021-06-15 NOTE — Telephone Encounter (Signed)
Pt. Needs refill on her allerga to [facility] on [address]  ?

## 2021-06-15 NOTE — Telephone Encounter (Signed)
done

## 2021-06-16 NOTE — Telephone Encounter (Signed)
P.A.approved til 04/02/22, pt informed, faxed pharmacy

## 2021-06-16 NOTE — Telephone Encounter (Signed)
P.A. DEXILANT BRAND, called pt and she states she has tried the generic and it didn't work, food kept regurgitating, wasn't able to keep food down

## 2021-06-22 ENCOUNTER — Telehealth: Payer: Self-pay | Admitting: Medical

## 2021-06-22 NOTE — Telephone Encounter (Signed)
Hello Dr. Terri Skains ? ?I received the surgery preop request for an upcoming hysterectomy and uterine fibroid removal.  I want to get your thoughts on surgery clearance from a cardiac standpoint.  I appreciate your feedback.  Not sure if you need to get her back for follow up or not.  ? ?Thanks for your time ?[name], PA-C ? ? ? ?

## 2021-06-22 NOTE — Telephone Encounter (Signed)
I received a surgery clearance request ? ?Please schedule patient for a visit soon, fasting.  Typically preop visits require labs within 30 days of the upcoming surgery, review of chart records to assess risk, and then possibly corresponding with other specialist if needed.  ?

## 2021-06-23 ENCOUNTER — Other Ambulatory Visit: Payer: Self-pay | Admitting: Obstetrics & Gynecology

## 2021-06-23 ENCOUNTER — Other Ambulatory Visit: Payer: Self-pay

## 2021-06-23 ENCOUNTER — Ambulatory Visit (INDEPENDENT_AMBULATORY_CARE_PROVIDER_SITE_OTHER): Payer: Medicare HMO | Admitting: Medical

## 2021-06-23 VITALS — BP 122/80 | HR 72 | Wt 246.4 lb

## 2021-06-23 DIAGNOSIS — R7301 Impaired fasting glucose: Secondary | ICD-10-CM

## 2021-06-23 DIAGNOSIS — I10 Essential (primary) hypertension: Secondary | ICD-10-CM

## 2021-06-23 DIAGNOSIS — Z01818 Encounter for other preprocedural examination: Secondary | ICD-10-CM

## 2021-06-23 DIAGNOSIS — E7800 Pure hypercholesterolemia, unspecified: Secondary | ICD-10-CM | POA: Diagnosis not present

## 2021-06-23 LAB — POCT GLYCOSYLATED HEMOGLOBIN (HGB A1C): Hemoglobin A1C: 5.8 % — AB (ref 4.0–5.6)

## 2021-06-23 NOTE — Progress Notes (Addendum)
Subjective: ? Lisa Briggs is a 61 y.o. female who presents for ?Chief Complaint  ?Patient presents with  ? surgery clearance  ?  Surgery clearance- having some kind of obgyn procedure  ?   ?Here for surgery clearance.  She is having D&C hysteroscopy and myoma resection planned for July 14, 2021 with Dr. Benjie Karvonen. ? ?Primary Care Provider ?Nieve Rojero, Camelia Eng, PA-C here for primary care ?  ?Current Health Care Team: ?Dentist, Dr. Joylene Igo ?Eye doctor, Dr. Katy Fitch [facility] ?Dr. Basil Dess, orthopedics ?Dr. Rex Kras, cardiology ?Dr. Bo Merino and Hazel Sams PA, rheumatology ?Dr. [name], [facility] ?Dr. Owens Loffler, GI ?Neurology, Dr. Narda Amber ?Dr. Azucena Fallen, gynecology ? ? ?She has a history of hypertension, hyperlipidemia, history of TIA, history of anemia, allergic rhinitis, arthritis, chronic back pain, history of GIST tumor, GERD, uterine fibroids.  She notes compliance with her medications. ? ?She notes no prior complications with anesthesia or surgery. ? ?Unfortunately her mother passed away in 2022-05-25 of cancer, she had stomach and breast cancer.  Her brother passed away in 2022-06-25 of heart failure. ? ?She has no specific complaints today.  Denies chest pain, shortness of breath, edema, palpitations. ? ?No other aggravating or relieving factors.   ? ?No other c/o. ? ?Past Medical History:  ?Diagnosis Date  ? Allergy   ? Anemia   ? iron therapy for years as of 10/12; normal Hgb 08/2013  ? Anxiety   ? Arthritis   ? Chest pain 04/05/2011  ? cardiac eval, normal treadmill stress test, Dr. Tollie Eth  ? Chronic back pain   ? Constipation   ? Farsightedness   ? wears glasses, Eye care center  ? 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 Dr. [name], [facility]  ? GERD (gastroesophageal reflux disease)   ? History of uterine fibroid   ? Hyperlipidemia   ? Hypertension   ? Paresthesia 09/2014  ? initially thought to be TIA, neurology consult in  12/2014 with other non TIA considerations.    ? Polyarthralgia   ? normal rheumatoid screen 01/2012  ? TIA (transient ischemic attack) 2015/16  ? ?Current Outpatient Medications on File Prior to Visit  ?Medication Sig Dispense Refill  ? carvedilol (COREG) 6.25 MG tablet Take 1 tablet (6.25 mg total) by mouth 2 (two) times daily. 180 tablet 3  ? Cholecalciferol (VITAMIN D) 50 MCG (2000 UT) CAPS Take 1 capsule (2,000 Units total) by mouth daily. 90 capsule 3  ? DEXILANT 60 MG capsule Take 1 capsule (60 mg total) by mouth daily. 90 capsule 3  ? diclofenac Sodium (VOLTAREN) 1 % GEL APPLY 2 GRAMS TOPICALLY 4 TIMES DAILY 100 g 0  ? EQ ASPIRIN ADULT LOW DOSE 81 MG EC tablet Take 1 tablet by mouth once daily 90 tablet 0  ? etodolac (LODINE) 400 MG tablet Take 400 mg by mouth 2 (two) times daily.    ? famotidine (PEPCID) 20 MG tablet TAKE 1 TABLET DAILY AT BEDTIME 90 tablet 3  ? fexofenadine (ALLEGRA ALLERGY) 180 MG tablet Take 1 tablet (180 mg total) by mouth daily. 90 tablet 0  ? fluticasone (FLONASE) 50 MCG/ACT nasal spray Place 2 sprays into both nostrils daily. 15 mL 2  ? gabapentin (NEURONTIN) 300 MG capsule Take 1 capsule (300 mg total) by mouth 3 (three) times daily. 270 capsule 3  ? hydroxychloroquine (PLAQUENIL) 200 MG tablet TAKE 1 TABLET BY MOUTH TWICE DAILY FOR RHEUMATOID ARTHRITIS 180 tablet 0  ?  Insulin Pen Needle (BD PEN NEEDLE NANO U/F) 32G X 4 MM MISC 1 each by Does not apply route at bedtime. 100 each 11  ? linaclotide (LINZESS) 145 MCG CAPS capsule Take 1 capsule (145 mcg total) by mouth daily before breakfast. 90 capsule 1  ? losartan (COZAAR) 50 MG tablet Take 1 tablet by mouth once daily 90 tablet 0  ? ondansetron (ZOFRAN) 4 MG tablet TAKE 1 TABLET BY MOUTH EVERY 8 HOURS AS NEEDED FOR NAUSEA OR VOMITING 90 tablet 0  ? potassium chloride (KLOR-CON) 10 MEQ tablet Take 1 tablet (10 mEq total) by mouth 2 (two) times daily. (Patient taking differently: Take 10 mEq by mouth daily.) 180 tablet 3  ?  rosuvastatin (CRESTOR) 20 MG tablet Take 1 tablet (20 mg total) by mouth daily. 90 tablet 3  ? spironolactone (ALDACTONE) 25 MG tablet Take 1 tablet (25 mg total) by mouth daily. 90 tablet 3  ? venlafaxine XR (EFFEXOR-XR) 37.5 MG 24 hr capsule TAKE 1 CAPSULE DAILY WITH BREAKFAST. 90 capsule 1  ? blood glucose meter kit and supplies KIT Test once daily-DX-E11.69 1 each 0  ? Glucose Blood (BLOOD GLUCOSE TEST STRIPS) STRP Inject 1 each as directed daily. 100 strip 5  ? Misc. Devices (ROLLATOR ULTRA-LIGHT) MISC Use as directed. 1 each 0  ? ?No current facility-administered medications on file prior to visit.  ? ?Family History  ?Problem Relation Age of Onset  ? Hypertension Mother   ? Arthritis Mother   ? GER disease Mother   ? Glaucoma Mother   ? Breast cancer Mother   ? Stroke Father   ? Breast cancer Sister   ?     breast cancer dx late 79s  ? Hypertension Sister   ? Colon cancer Brother 15  ? Diabetes Brother   ? Diabetes Maternal Aunt   ? Heart disease Maternal Aunt   ? Heart disease Maternal Grandmother   ? Heart disease Maternal Grandfather   ? Heart disease Maternal Uncle   ? Stroke Maternal Uncle   ? Leukemia Sister   ? Hypertension Sister   ? Healthy Son   ? Colon polyps Neg Hx   ? Esophageal cancer Neg Hx   ? Stomach cancer Neg Hx   ? Rectal cancer Neg Hx   ? ?Past Surgical History:  ?Procedure Laterality Date  ? COLONOSCOPY  01/2014  ? diverticulosis, othwerise normal - Dr. Owens Loffler  ? ESOPHAGOGASTRODUODENOSCOPY  2013  ? Dr. Benson Norway, gastritis  ? ESOPHAGOGASTRODUODENOSCOPY  [id number]  ? EUS N/A 04/16/2014  ? Procedure: UPPER ENDOSCOPIC ULTRASOUND (EUS) LINEAR;  Surgeon: Milus Banister, MD;  Location: [facility];  Service: Endoscopy;  Laterality: N/A;  ? gall stone surgery    ? KNEE ARTHROSCOPY Left   ? LAPAROSCOPIC GASTRIC RESECTION N/A 06/09/2014  ? Procedure: LAPAROSCOPIC GASTRIC MASS RESECTION;  Surgeon: [name], MD;  Location: [facility];  Service: General;  Laterality: N/A;  ? LIPOMA EXCISION    ?  forehead  ? LUMBAR LAMINECTOMY N/A 04/25/2019  ? Procedure: LEFT L2-3 MICRODISCECTOMY, BILATERAL L5-S1 PARTIAL HEMILAMINECTOMY;  Surgeon: Jessy Oto, MD;  Location: [place];  Service: Orthopedics;  Laterality: N/A;  ? UPPER GASTROINTESTINAL ENDOSCOPY    ? UTERINE FIBROID SURGERY    ? ? ? ?The following portions of the patient's history were reviewed and updated as appropriate: allergies, current medications, past family history, past medical history, past social history, past surgical history and problem list. ? ?ROS ?Otherwise as in subjective above ? ?  Objective: ?BP 122/80   Pulse 72   Wt 246 lb 6.4 oz (111.8 kg)   LMP 02/23/2015 (LMP Unknown)   BMI 36.39 kg/m?  ? ?BP Readings from Last 3 Encounters:  ?06/23/21 122/80  ?03/15/21 125/85  ?01/27/21 110/70  ? ?Wt Readings from Last 3 Encounters:  ?06/23/21 246 lb 6.4 oz (111.8 kg)  ?03/15/21 250 lb (113.4 kg)  ?01/27/21 248 lb 6.4 oz (112.7 kg)  ? ? ?General appearance: alert, no distress, well developed, well nourished, obese African-American female ?HEENT: normocephalic, sclerae anicteric, conjunctiva pink and moist, TMs pearly, nares patent, no discharge or erythema, pharynx normal ?Oral cavity: MMM, no lesions, no small airway ?Neck: supple, no lymphadenopathy, no thyromegaly, no masses, no bruits ?Heart: RRR, normal S1, S2, no murmurs ?Lungs: CTA bilaterally, no wheezes, rhonchi, or rales ?Abdomen: +bs, soft, non tender, non distended, no masses, no hepatomegaly, no splenomegaly ?Pulses: 2+ radial pulses, 2+ pedal pulses, normal cap refill ?Ext: no edema ?She walks with a cane and is somewhat guarded with ambulation ?Neuro: Alert and oriented x3, CN II through XII intact, nonfocal exam ? ? ? ? ?Assessment: ?Encounter Diagnoses  ?Name Primary?  ? Preop examination Yes  ? Essential hypertension   ? Impaired fasting blood sugar   ? Pure hypercholesterolemia   ? ? ? ?Plan: ?I reviewed her health history.  Labs as below.  Hemoglobin A1c 5.8% today. ? ?She has  had no recent cardiac pulmonary or neurological symptoms. ? ?I had reviewed her vaccines and she is due for tetanus update and pneumococcal vaccine.  She will check insurance about doing a tetanus vaccine.  I recommend she

## 2021-06-23 NOTE — Patient Instructions (Signed)
Call and check insurance coverage about tetanus booster and pneumococcal vaccine.   ? ?You are due for tetanus booster and pneumococcal 23 vaccine ? ? ?

## 2021-06-23 NOTE — Telephone Encounter (Signed)
Pt is going to come on Monday around 11:30am and I have put her on your schedule for 15 mins so you can read results to interpret and document ?

## 2021-06-23 NOTE — Telephone Encounter (Signed)
noted 

## 2021-06-24 LAB — PT AND PTT
INR: 1 (ref 0.9–1.2)
Prothrombin Time: 10.7 s (ref 9.1–12.0)
aPTT: 30 s (ref 24–33)

## 2021-06-24 LAB — BASIC METABOLIC PANEL
BUN/Creatinine Ratio: 18 (ref 9–23)
BUN: 14 mg/dL (ref 6–24)
CO2: 25 mmol/L (ref 20–29)
Calcium: 9.7 mg/dL (ref 8.7–10.2)
Chloride: 107 mmol/L — ABNORMAL HIGH (ref 96–106)
Creatinine, Ser: 0.8 mg/dL (ref 0.57–1.00)
Glucose: 89 mg/dL (ref 70–99)
Potassium: 4.3 mmol/L (ref 3.5–5.2)
Sodium: 144 mmol/L (ref 134–144)
eGFR: 85 mL/min/{1.73_m2} (ref >59)

## 2021-06-24 LAB — CBC
Hematocrit: 39.1 % (ref 34.0–46.6)
Hemoglobin: 12.3 g/dL (ref 11.1–15.9)
MCH: 26.8 pg (ref 26.6–33.0)
MCHC: 31.5 g/dL (ref 31.5–35.7)
MCV: 85 fL (ref 79–97)
Platelets: 291 10*3/uL (ref 150–450)
RBC: 4.59 x10E6/uL (ref 3.77–5.28)
RDW: 12.1 % (ref 11.7–15.4)
WBC: 6.8 10*3/uL (ref 3.4–10.8)

## 2021-06-26 ENCOUNTER — Other Ambulatory Visit: Payer: Self-pay | Admitting: Medical

## 2021-06-27 ENCOUNTER — Other Ambulatory Visit: Payer: Medicare HMO

## 2021-06-28 ENCOUNTER — Other Ambulatory Visit: Payer: Self-pay | Admitting: Medical

## 2021-06-28 ENCOUNTER — Other Ambulatory Visit: Payer: Self-pay | Admitting: *Deleted

## 2021-06-28 ENCOUNTER — Telehealth: Payer: Self-pay

## 2021-06-28 DIAGNOSIS — R87810 Cervical high risk human papillomavirus (HPV) DNA test positive: Secondary | ICD-10-CM | POA: Diagnosis not present

## 2021-06-28 DIAGNOSIS — Z1231 Encounter for screening mammogram for malignant neoplasm of breast: Secondary | ICD-10-CM

## 2021-06-28 DIAGNOSIS — Z01419 Encounter for gynecological examination (general) (routine) without abnormal findings: Secondary | ICD-10-CM | POA: Diagnosis not present

## 2021-06-28 DIAGNOSIS — Z01411 Encounter for gynecological examination (general) (routine) with abnormal findings: Secondary | ICD-10-CM | POA: Diagnosis not present

## 2021-06-28 DIAGNOSIS — N950 Postmenopausal bleeding: Secondary | ICD-10-CM | POA: Diagnosis not present

## 2021-06-28 DIAGNOSIS — R69 Illness, unspecified: Secondary | ICD-10-CM | POA: Diagnosis not present

## 2021-06-28 DIAGNOSIS — Z6836 Body mass index (BMI) 36.0-36.9, adult: Secondary | ICD-10-CM | POA: Diagnosis not present

## 2021-06-28 DIAGNOSIS — Z124 Encounter for screening for malignant neoplasm of cervix: Secondary | ICD-10-CM | POA: Diagnosis not present

## 2021-06-28 NOTE — Telephone Encounter (Signed)
Pt. Called to reschedule EKG nurse visit and wanted to know if she could also get her pneumonia shot then she couldn't remember what you said if you wanted her to wait until after surgery or not.  ?

## 2021-06-29 ENCOUNTER — Other Ambulatory Visit: Payer: Medicare HMO

## 2021-06-29 NOTE — Telephone Encounter (Signed)
Pt did not want to come today for pneumonia shot ?

## 2021-06-29 NOTE — Telephone Encounter (Signed)
Pt was advised to call cardiology about EKG since our machine is down.  ?

## 2021-07-01 ENCOUNTER — Encounter: Payer: Self-pay | Admitting: Cardiology

## 2021-07-01 ENCOUNTER — Ambulatory Visit: Payer: Medicare HMO | Admitting: Cardiology

## 2021-07-01 ENCOUNTER — Telehealth: Payer: Self-pay

## 2021-07-01 VITALS — BP 138/85 | HR 77 | Temp 97.7°F | Resp 17 | Ht 69.0 in | Wt 246.6 lb

## 2021-07-01 DIAGNOSIS — Z6836 Body mass index (BMI) 36.0-36.9, adult: Secondary | ICD-10-CM | POA: Diagnosis not present

## 2021-07-01 DIAGNOSIS — I10 Essential (primary) hypertension: Secondary | ICD-10-CM

## 2021-07-01 DIAGNOSIS — Z01810 Encounter for preprocedural cardiovascular examination: Secondary | ICD-10-CM

## 2021-07-01 DIAGNOSIS — I5022 Chronic systolic (congestive) heart failure: Secondary | ICD-10-CM | POA: Diagnosis not present

## 2021-07-01 DIAGNOSIS — E782 Mixed hyperlipidemia: Secondary | ICD-10-CM

## 2021-07-01 DIAGNOSIS — I429 Cardiomyopathy, unspecified: Secondary | ICD-10-CM | POA: Diagnosis not present

## 2021-07-01 DIAGNOSIS — Z8673 Personal history of transient ischemic attack (TIA), and cerebral infarction without residual deficits: Secondary | ICD-10-CM

## 2021-07-01 NOTE — Progress Notes (Signed)
? ?Lisa Briggs ?Date of Birth: 1960-06-24 ?MRN: [id number] ?Primary Care Provider:Tysinger, Camelia Eng, PA-C ?Former Cardiology Providers: Jeri Lager, APRN, FNP-C ?Primary Cardiologist: Rex Kras, DO, [facility] (established care 07/02/2019) ? ?Date: 07/01/21 ?Last Office Visit: 10/20/2020 ? ?Chief Complaint  ?Patient presents with  ? Follow-up  ?  6 month ?Preoperative clearance   ? ?HPI  ?Lisa Briggs is a 61 y.o.  female whose past medical history and cardiovascular risk factors include: Seropositive rheumatoid arthritis ([place]), osteoarthritis, DDD, benign essential hypertension, transient ischemic attack, mildly reduced left ventricular systolic function consistent with cardiomyopathy, obesity. ? ?Patient is being followed by the practice for management of mildly reduced LVEF and cardiovascular risk factors.  Since last office visit patient denies angina pectoris or heart failure symptoms.  No hospitalizations or urgent care visits for cardiovascular symptoms.  Unfortunately since January 2023 patient has lost her mother and brother and is currently grieving.  Deepest condolences were conveyed to her at today's office visit. ? ?Patient is scheduled for an elective D&C/hysteroscopy and myoma resection on July 14, 2021.  She is requesting a preop clearance. ? ?ALLERGIES: ?Allergies  ?Allergen Reactions  ? Kiwi Extract Hives and Itching  ? Mucinex Dm [Dm-Guaifenesin Er] Nausea Only  ?  Messes up her stomach. Pt report on 01/21/15  ? Peach [Prunus Persica] Itching  ?  Peach peeling makes pt itch  ? ? ? ?MEDICATION LIST PRIOR TO VISIT: ?Current Outpatient Medications on File Prior to Visit  ?Medication Sig Dispense Refill  ? blood glucose meter kit and supplies KIT Test once daily-DX-E11.69 1 each 0  ? carvedilol (COREG) 6.25 MG tablet Take 1 tablet (6.25 mg total) by mouth 2 (two) times daily. 180 tablet 3  ? Cholecalciferol (VITAMIN D) 50 MCG (2000 UT) CAPS Take 1 capsule (2,000 Units total) by mouth daily. 90  capsule 3  ? DEXILANT 60 MG capsule Take 1 capsule (60 mg total) by mouth daily. 90 capsule 3  ? diclofenac Sodium (VOLTAREN) 1 % GEL APPLY 2 GRAMS TOPICALLY 4 TIMES DAILY 100 g 0  ? ENTRESTO 49-51 MG Take 1 tablet by mouth 2 (two) times daily.    ? EQ ASPIRIN ADULT LOW DOSE 81 MG EC tablet Take 1 tablet by mouth once daily 90 tablet 0  ? etodolac (LODINE) 400 MG tablet Take 400 mg by mouth as needed.    ? fexofenadine (ALLEGRA ALLERGY) 180 MG tablet Take 1 tablet (180 mg total) by mouth daily. 90 tablet 0  ? fluticasone (FLONASE) 50 MCG/ACT nasal spray Place 2 sprays into both nostrils daily. 15 mL 2  ? gabapentin (NEURONTIN) 300 MG capsule TAKE 1 CAPSULE BY MOUTH THREE TIMES DAILY 90 capsule 5  ? Glucose Blood (BLOOD GLUCOSE TEST STRIPS) STRP Inject 1 each as directed daily. 100 strip 5  ? hydroxychloroquine (PLAQUENIL) 200 MG tablet TAKE 1 TABLET BY MOUTH TWICE DAILY FOR RHEUMATOID ARTHRITIS 180 tablet 0  ? Insulin Pen Needle (BD PEN NEEDLE NANO U/F) 32G X 4 MM MISC 1 each by Does not apply route at bedtime. 100 each 11  ? linaclotide (LINZESS) 145 MCG CAPS capsule Take 1 capsule (145 mcg total) by mouth daily before breakfast. 90 capsule 1  ? Misc. Devices (ROLLATOR ULTRA-LIGHT) MISC Use as directed. 1 each 0  ? ondansetron (ZOFRAN) 4 MG tablet TAKE 1 TABLET BY MOUTH EVERY 8 HOURS AS NEEDED FOR NAUSEA OR VOMITING 90 tablet 0  ? potassium chloride (KLOR-CON) 10 MEQ tablet Take 1 tablet (10 mEq total)  by mouth 2 (two) times daily. (Patient taking differently: Take 10 mEq by mouth daily.) 180 tablet 3  ? rosuvastatin (CRESTOR) 20 MG tablet Take 1 tablet (20 mg total) by mouth daily. 90 tablet 3  ? venlafaxine XR (EFFEXOR-XR) 37.5 MG 24 hr capsule TAKE 1 CAPSULE DAILY WITH BREAKFAST. 90 capsule 1  ? ?No current facility-administered medications on file prior to visit.  ? ? ?PAST MEDICAL HISTORY: ?Past Medical History:  ?Diagnosis Date  ? Allergy   ? Anemia   ? iron therapy for years as of 10/12; normal Hgb 08/2013  ?  Anxiety   ? Arthritis   ? Chest pain 04/05/2011  ? cardiac eval, normal treadmill stress test, Dr. Tollie Eth  ? Chronic back pain   ? Constipation   ? Farsightedness   ? wears glasses, Eye care center  ? 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 Dr. [name], [facility]  ? GERD (gastroesophageal reflux disease)   ? History of uterine fibroid   ? Hyperlipidemia   ? Hypertension   ? Paresthesia 09/2014  ? initially thought to be TIA, neurology consult in 12/2014 with other non TIA considerations.    ? Polyarthralgia   ? normal rheumatoid screen 01/2012  ? TIA (transient ischemic attack) 2015/16  ? ? ?PAST SURGICAL HISTORY: ?Past Surgical History:  ?Procedure Laterality Date  ? COLONOSCOPY  01/2014  ? diverticulosis, othwerise normal - Dr. Owens Loffler  ? ESOPHAGOGASTRODUODENOSCOPY  2013  ? Dr. Benson Norway, gastritis  ? ESOPHAGOGASTRODUODENOSCOPY  [id number]  ? EUS N/A 04/16/2014  ? Procedure: UPPER ENDOSCOPIC ULTRASOUND (EUS) LINEAR;  Surgeon: Milus Banister, MD;  Location: [facility];  Service: Endoscopy;  Laterality: N/A;  ? gall stone surgery    ? KNEE ARTHROSCOPY Left   ? LAPAROSCOPIC GASTRIC RESECTION N/A 06/09/2014  ? Procedure: LAPAROSCOPIC GASTRIC MASS RESECTION;  Surgeon: [name], MD;  Location: [facility];  Service: General;  Laterality: N/A;  ? LIPOMA EXCISION    ? forehead  ? LUMBAR LAMINECTOMY N/A 04/25/2019  ? Procedure: LEFT L2-3 MICRODISCECTOMY, BILATERAL L5-S1 PARTIAL HEMILAMINECTOMY;  Surgeon: Jessy Oto, MD;  Location: [place];  Service: Orthopedics;  Laterality: N/A;  ? UPPER GASTROINTESTINAL ENDOSCOPY    ? UTERINE FIBROID SURGERY    ? ? ?FAMILY HISTORY: ?The patient's family history includes Arthritis in her mother; Breast cancer in her mother and sister; Colon cancer (age of onset: 26) in her brother; Diabetes in her brother and maternal aunt; GER disease in her mother; Glaucoma in her mother; Healthy in her son; Heart disease in her maternal aunt, maternal  grandfather, maternal grandmother, and maternal uncle; Hypertension in her mother, sister, and sister; Leukemia in her sister; Stroke in her father and maternal uncle. ?  ?SOCIAL HISTORY:  ?The patient  reports that she has never smoked. She has never used smokeless tobacco. She reports that she does not drink alcohol and does not use drugs. ? ?Review of Systems  ?Cardiovascular:  Negative for chest pain, dyspnea on exertion, leg swelling, orthopnea, palpitations, paroxysmal nocturnal dyspnea and syncope.  ?Respiratory:  Negative for shortness of breath.   ? ?PHYSICAL EXAM: ? ?  07/01/2021  ? 10:04 AM 06/23/2021  ?  9:45 AM 03/15/2021  ?  1:02 PM  ?Vitals with BMI  ?Height _0   _1   ?Weight 246 lbs 10 oz 246 lbs 6 oz 250 lbs  ?BMI 36.4  36.9  ?Systolic 063 016 010  ?Diastolic 85 80 85  ?  Pulse 77 72 76  ? ?CONSTITUTIONAL: Age-appropriate  female, obese, walks with cane, no acute distress, hemodynamically stable.  ?SKIN: Skin is warm and dry. No rash noted. No cyanosis. No pallor. No jaundice ?HEAD: Normocephalic and atraumatic.  ?EYES: No scleral icterus ?MOUTH/THROAT: Moist oral membranes.  ?NECK: No JVD present. No thyromegaly noted. No carotid bruits  ?LYMPHATIC: No visible cervical adenopathy.  ?CHEST Normal respiratory effort. No intercostal retractions  ?LUNGS: Clear to auscultation bilaterally.  No stridor. No wheezes. No rales.  ?CARDIOVASCULAR: Regular rate and rhythm, positive S1-S2, no murmurs rubs or gallops appreciated.  ?ABDOMINAL: Obese, soft, nontender, nondistended, positive bowel sounds all 4 quadrants.No apparent ascites.  ?EXTREMITIES: No peripheral edema.  Warm to touch. ?HEMATOLOGIC: No significant bruising ?NEUROLOGIC: Oriented to person, place, and time. Nonfocal. Normal muscle tone.  ?PSYCHIATRIC: Normal mood and affect. Normal behavior. Cooperative ? ?CARDIAC DATABASE: ?EKG: ?07/01/2021: NSR, 75 bpm, TWI V1-V2, without underlying injury pattern.  No change compared to prior  ECG. ? ?Echocardiogram: ?04/17/2019: LVEF 37-44%, grade 2 diastolic dysfunction, elevated left atrial pressure, mild TR, no pulmonary hypertension. ? ?06/05/2019: LVEF 45-50%, normal wall thickness, mild global hypokinesis, grade

## 2021-07-04 ENCOUNTER — Telehealth: Payer: Self-pay | Admitting: Medical

## 2021-07-04 NOTE — Telephone Encounter (Signed)
This has been faxed.

## 2021-07-04 NOTE — Telephone Encounter (Signed)
SCAN and fax over surgery clearance form.  Send copy of recent labs, and my and cardiology notes highlighting that she is clear for surgery (in the plan section of the notes) ?

## 2021-07-06 ENCOUNTER — Encounter (HOSPITAL_BASED_OUTPATIENT_CLINIC_OR_DEPARTMENT_OTHER): Payer: Self-pay | Admitting: Obstetrics & Gynecology

## 2021-07-06 ENCOUNTER — Other Ambulatory Visit: Payer: Self-pay

## 2021-07-06 NOTE — Progress Notes (Signed)
Spoke w/ via phone for pre-op interview: patient ?Lab needs dos: T&S; please discontinue CBC and BMP if okay with surgeon ?Lab results: EKG 07/01/21; Myocardial Perfusion imaging 07/09/19- EF 51%; CBC, BMP, and A1C 06/23/21 ?COVID test: patient states asymptomatic no test needed. ?Arrive at 1130 07/14/21 ?NPO after MN except clear liquids.Clear liquids from MN until 1030 ?Med rec completed. ?Medications to take morning of surgery: famotidine, Flonase, Dexilant, allegra, Effexor, Plaquenil, and Carvedilol; pt states she does not want to take gabapentin DOS ?Diabetic medication: NA ?Patient instructed no nail polish to be worn day of surgery. ?Patient instructed to bring photo id and insurance card day of surgery.  ?Patient aware to have Driver (ride ) / caregiver for 24 hours after surgery. (Husband to drive) ?Patient Special Instructions: patient instructed to stop etodolac 5 days prior to sx and to contact surgeon for instruction regarding whether or not to hold aspirin and for how long ?Pre-Op special Istructions: NA ?Patient verbalized understanding of instructions that were given at this phone interview. ?Patient denies shortness of breath, chest pain, fever, cough at this phone interview.  ?

## 2021-07-11 ENCOUNTER — Encounter (HOSPITAL_COMMUNITY)
Admission: RE | Admit: 2021-07-11 | Discharge: 2021-07-11 | Disposition: A | Discharge status: Home / Self Care | Payer: Medicare HMO | Source: Ambulatory Visit | Attending: Obstetrics & Gynecology | Admitting: Obstetrics & Gynecology

## 2021-07-11 DIAGNOSIS — Z01812 Encounter for preprocedural laboratory examination: Secondary | ICD-10-CM | POA: Diagnosis present

## 2021-07-11 LAB — CBC
HCT: 41.6 % (ref 36.0–46.0)
Hemoglobin: 12.3 g/dL (ref 12.0–15.0)
MCH: 26.7 pg (ref 26.0–34.0)
MCHC: 29.6 g/dL — ABNORMAL LOW (ref 30.0–36.0)
MCV: 90.4 fL (ref 80.0–100.0)
Platelets: 288 10*3/uL (ref 150–400)
RBC: 4.6 MIL/uL (ref 3.87–5.11)
RDW: 13.2 % (ref 11.5–15.5)
WBC: 6.2 10*3/uL (ref 4.0–10.5)
nRBC: 0 % (ref 0.0–0.2)

## 2021-07-11 LAB — BASIC METABOLIC PANEL
Anion gap: 3 — ABNORMAL LOW (ref 5–15)
BUN: 14 mg/dL (ref 6–20)
CO2: 30 mmol/L (ref 22–32)
Calcium: 8.9 mg/dL (ref 8.9–10.3)
Chloride: 110 mmol/L (ref 98–111)
Creatinine, Ser: 0.68 mg/dL (ref 0.44–1.00)
GFR, Estimated: >60 mL/min (ref >60)
Glucose, Bld: 85 mg/dL (ref 70–99)
Potassium: 4.4 mmol/L (ref 3.5–5.1)
Sodium: 143 mmol/L (ref 135–145)

## 2021-07-14 ENCOUNTER — Encounter (HOSPITAL_BASED_OUTPATIENT_CLINIC_OR_DEPARTMENT_OTHER)
Admission: RE | Disposition: A | Discharge status: Home / Self Care | Payer: Self-pay | Source: Home / Self Care | Attending: Obstetrics & Gynecology

## 2021-07-14 ENCOUNTER — Ambulatory Visit (HOSPITAL_BASED_OUTPATIENT_CLINIC_OR_DEPARTMENT_OTHER): Payer: Medicare HMO | Admitting: Anesthesiology

## 2021-07-14 ENCOUNTER — Other Ambulatory Visit: Payer: Self-pay

## 2021-07-14 ENCOUNTER — Ambulatory Visit (HOSPITAL_BASED_OUTPATIENT_CLINIC_OR_DEPARTMENT_OTHER)
Admission: RE | Admit: 2021-07-14 | Discharge: 2021-07-14 | Disposition: A | Discharge status: Home / Self Care | Payer: Medicare HMO | Attending: Obstetrics & Gynecology | Admitting: Obstetrics & Gynecology

## 2021-07-14 ENCOUNTER — Encounter (HOSPITAL_BASED_OUTPATIENT_CLINIC_OR_DEPARTMENT_OTHER): Payer: Self-pay | Admitting: Obstetrics & Gynecology

## 2021-07-14 DIAGNOSIS — K219 Gastro-esophageal reflux disease without esophagitis: Secondary | ICD-10-CM | POA: Insufficient documentation

## 2021-07-14 DIAGNOSIS — F419 Anxiety disorder, unspecified: Secondary | ICD-10-CM | POA: Diagnosis not present

## 2021-07-14 DIAGNOSIS — N950 Postmenopausal bleeding: Secondary | ICD-10-CM | POA: Diagnosis not present

## 2021-07-14 DIAGNOSIS — N840 Polyp of corpus uteri: Secondary | ICD-10-CM | POA: Insufficient documentation

## 2021-07-14 DIAGNOSIS — D250 Submucous leiomyoma of uterus: Secondary | ICD-10-CM | POA: Diagnosis not present

## 2021-07-14 DIAGNOSIS — N841 Polyp of cervix uteri: Secondary | ICD-10-CM | POA: Diagnosis not present

## 2021-07-14 DIAGNOSIS — I10 Essential (primary) hypertension: Secondary | ICD-10-CM | POA: Diagnosis not present

## 2021-07-14 DIAGNOSIS — Z8500 Personal history of malignant neoplasm of unspecified digestive organ: Secondary | ICD-10-CM | POA: Diagnosis not present

## 2021-07-14 DIAGNOSIS — R69 Illness, unspecified: Secondary | ICD-10-CM | POA: Diagnosis not present

## 2021-07-14 HISTORY — PX: DILATATION & CURETTAGE/HYSTEROSCOPY WITH MYOSURE: SHX6511

## 2021-07-14 HISTORY — DX: Cardiomyopathy, unspecified: I42.9

## 2021-07-14 LAB — TYPE AND SCREEN
ABO/RH(D): O POS
Antibody Screen: NEGATIVE

## 2021-07-14 LAB — GLUCOSE, CAPILLARY: Glucose-Capillary: 81 mg/dL (ref 70–99)

## 2021-07-14 SURGERY — DILATATION & CURETTAGE/HYSTEROSCOPY WITH MYOSURE
Anesthesia: General

## 2021-07-14 MED ORDER — MIDAZOLAM HCL 2 MG/2ML IJ SOLN
INTRAMUSCULAR | Status: AC
  Filled 2021-07-14: qty 2

## 2021-07-14 MED ORDER — ACETAMINOPHEN 500 MG PO TABS
1000.0000 mg | ORAL_TABLET | Freq: Once | ORAL | Status: AC
  Administered 2021-07-14: 1000 mg via ORAL

## 2021-07-14 MED ORDER — DEXAMETHASONE SODIUM PHOSPHATE 10 MG/ML IJ SOLN
INTRAMUSCULAR | Status: AC
  Filled 2021-07-14: qty 1

## 2021-07-14 MED ORDER — OXYCODONE HCL 5 MG PO TABS: 5.0000 mg | ORAL_TABLET | Freq: Once | ORAL | Status: DC | PRN

## 2021-07-14 MED ORDER — LIDOCAINE HCL (PF) 2 % IJ SOLN
INTRAMUSCULAR | Status: AC
Start: 2021-07-14 — End: ?
  Filled 2021-07-14: qty 5

## 2021-07-14 MED ORDER — ONDANSETRON HCL 4 MG/2ML IJ SOLN
INTRAMUSCULAR | Status: AC
Start: 2021-07-14 — End: ?
  Filled 2021-07-14: qty 2

## 2021-07-14 MED ORDER — FENTANYL CITRATE (PF) 100 MCG/2ML IJ SOLN: 25.0000 ug | INTRAMUSCULAR | Status: DC | PRN

## 2021-07-14 MED ORDER — LIDOCAINE-EPINEPHRINE 1 %-1:100000 IJ SOLN
INTRAMUSCULAR | Status: DC | PRN
  Administered 2021-07-14: 10 mL

## 2021-07-14 MED ORDER — ACETAMINOPHEN 500 MG PO TABS
ORAL_TABLET | ORAL | Status: AC
  Filled 2021-07-14: qty 2

## 2021-07-14 MED ORDER — EPHEDRINE 5 MG/ML INJ
INTRAVENOUS | Status: AC
  Filled 2021-07-14: qty 5

## 2021-07-14 MED ORDER — LIDOCAINE HCL (CARDIAC) PF 100 MG/5ML IV SOSY
PREFILLED_SYRINGE | INTRAVENOUS | Status: DC | PRN
  Administered 2021-07-14: 100 mg via INTRAVENOUS

## 2021-07-14 MED ORDER — MIDAZOLAM HCL 5 MG/5ML IJ SOLN
INTRAMUSCULAR | Status: DC | PRN
  Administered 2021-07-14: 2 mg via INTRAVENOUS

## 2021-07-14 MED ORDER — EPHEDRINE SULFATE (PRESSORS) 50 MG/ML IJ SOLN
INTRAMUSCULAR | Status: DC | PRN
  Administered 2021-07-14: 10 mg via INTRAVENOUS

## 2021-07-14 MED ORDER — POVIDONE-IODINE 10 % EX SWAB
2.0000 | Freq: Once | CUTANEOUS | Status: DC
Start: 2021-07-14 — End: 2021-07-14

## 2021-07-14 MED ORDER — OXYCODONE HCL 5 MG/5ML PO SOLN: 5.0000 mg | Freq: Once | ORAL | Status: DC | PRN

## 2021-07-14 MED ORDER — ONDANSETRON HCL 4 MG/2ML IJ SOLN: 4.0000 mg | Freq: Once | INTRAMUSCULAR | Status: DC | PRN

## 2021-07-14 MED ORDER — ONDANSETRON HCL 4 MG/2ML IJ SOLN
INTRAMUSCULAR | Status: DC | PRN
  Administered 2021-07-14: 4 mg via INTRAVENOUS

## 2021-07-14 MED ORDER — LACTATED RINGERS IV SOLN: INTRAVENOUS | Status: DC

## 2021-07-14 MED ORDER — CEFAZOLIN SODIUM-DEXTROSE 2-4 GM/100ML-% IV SOLN
INTRAVENOUS | Status: AC
  Filled 2021-07-14: qty 100

## 2021-07-14 MED ORDER — CEFAZOLIN SODIUM-DEXTROSE 2-4 GM/100ML-% IV SOLN
2.0000 g | INTRAVENOUS | Status: AC
  Administered 2021-07-14: 2 g via INTRAVENOUS

## 2021-07-14 MED ORDER — DEXAMETHASONE SODIUM PHOSPHATE 4 MG/ML IJ SOLN
INTRAMUSCULAR | Status: DC | PRN
  Administered 2021-07-14: 5 mg via INTRAVENOUS

## 2021-07-14 MED ORDER — KETOROLAC TROMETHAMINE 30 MG/ML IJ SOLN
INTRAMUSCULAR | Status: DC | PRN
  Administered 2021-07-14: 30 mg via INTRAVENOUS

## 2021-07-14 MED ORDER — PROPOFOL 10 MG/ML IV BOLUS
INTRAVENOUS | Status: DC | PRN
  Administered 2021-07-14: 200 mg via INTRAVENOUS

## 2021-07-14 MED ORDER — SODIUM CHLORIDE 0.9 % IR SOLN
Status: DC | PRN
  Administered 2021-07-14: 3000 mL

## 2021-07-14 MED ORDER — FENTANYL CITRATE (PF) 100 MCG/2ML IJ SOLN
INTRAMUSCULAR | Status: AC
  Filled 2021-07-14: qty 2

## 2021-07-14 MED ORDER — FENTANYL CITRATE (PF) 100 MCG/2ML IJ SOLN
INTRAMUSCULAR | Status: DC | PRN
  Administered 2021-07-14 (×4): 25 ug via INTRAVENOUS

## 2021-07-14 MED ORDER — AMISULPRIDE (ANTIEMETIC) 5 MG/2ML IV SOLN: 10.0000 mg | Freq: Once | INTRAVENOUS | Status: DC | PRN

## 2021-07-14 SURGICAL SUPPLY — 18 items
CATH ROBINSON RED A/P 16FR (CATHETERS) ×2 IMPLANT
DEVICE MYOSURE LITE (MISCELLANEOUS) IMPLANT
DEVICE MYOSURE REACH (MISCELLANEOUS) ×1 IMPLANT
DRSG TELFA 3X8 NADH (GAUZE/BANDAGES/DRESSINGS) ×2 IMPLANT
GAUZE 4X4 16PLY ~~LOC~~+RFID DBL (SPONGE) ×4 IMPLANT
GLOVE BIO SURGEON STRL SZ7 (GLOVE) ×2 IMPLANT
GLOVE BIOGEL PI IND STRL 7.0 (GLOVE) ×2 IMPLANT
GLOVE BIOGEL PI INDICATOR 7.0 (GLOVE) ×2
GOWN STRL REUS W/TWL LRG LVL3 (GOWN DISPOSABLE) ×2 IMPLANT
IV NS IRRIG 3000ML ARTHROMATIC (IV SOLUTION) ×2 IMPLANT
KIT PROCEDURE FLUENT (KITS) ×2 IMPLANT
KIT TURNOVER CYSTO (KITS) ×2 IMPLANT
PACK VAGINAL MINOR WOMEN LF (CUSTOM PROCEDURE TRAY) ×2 IMPLANT
PAD DRESSING TELFA 3X8 NADH (GAUZE/BANDAGES/DRESSINGS) ×1 IMPLANT
PAD OB MATERNITY 4.3X12.25 (PERSONAL CARE ITEMS) ×2 IMPLANT
SEAL CERVICAL OMNI LOK (ABLATOR) IMPLANT
SEAL ROD LENS SCOPE MYOSURE (ABLATOR) ×2 IMPLANT
TOWEL OR 17X26 10 PK STRL BLUE (TOWEL DISPOSABLE) ×2 IMPLANT

## 2021-07-14 NOTE — H&P (Signed)
Lisa Briggs is an 61 y.o. female w/ postmenopausal vaginal bleeding since Aug'22. Worse since Jan'23. She presented to office in Feb'23 w/ complaint. 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 Normal ovaries.  ?Pap - Mar'23 WNL, no endocx cells  ?Mammogr nl  ? ?Patient's last menstrual period was 02/23/2015 (lmp unknown). ?  ? ?Past Medical History:  ?Diagnosis Date  ? Allergy   ? Anemia   ? iron therapy for years as of 10/12; normal Hgb 08/2013  ? Anxiety   ? Arthritis   ? Cardiomyopathy ([place])   ? Chest pain 04/05/2011  ? cardiac eval, normal treadmill stress test, Dr. Tollie Eth  ? Chronic back pain   ? Constipation   ? Farsightedness   ? wears glasses, Eye care center  ? 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 Dr. [name], [facility]  ? GERD (gastroesophageal reflux disease)   ? History of uterine fibroid   ? Hyperlipidemia   ? Hypertension   ? Paresthesia 09/2014  ? initially thought to be TIA, neurology consult in 12/2014 with other non TIA considerations.    ? Polyarthralgia   ? normal rheumatoid screen 01/2012  ? TIA (transient ischemic attack) 2015/16  ? ? ?Past Surgical History:  ?Procedure Laterality Date  ? COLONOSCOPY  01/2014  ? diverticulosis, othwerise normal - Dr. Owens Loffler  ? ESOPHAGOGASTRODUODENOSCOPY  2013  ? Dr. Benson Norway, gastritis  ? ESOPHAGOGASTRODUODENOSCOPY  244010  ? EUS N/A 04/16/2014  ? Procedure: UPPER ENDOSCOPIC ULTRASOUND (EUS) LINEAR;  Surgeon: Milus Banister, MD;  Location: [facility];  Service: Endoscopy;  Laterality: N/A;  ? gall stone surgery    ? KNEE ARTHROSCOPY Left   ? LAPAROSCOPIC GASTRIC RESECTION N/A 06/09/2014  ? Procedure: LAPAROSCOPIC GASTRIC MASS RESECTION;  Surgeon: [name], MD;  Location: [facility];  Service: General;  Laterality: N/A;  ? LIPOMA  EXCISION    ? forehead  ? LUMBAR LAMINECTOMY N/A 04/25/2019  ? Procedure: LEFT L2-3 MICRODISCECTOMY, BILATERAL L5-S1 PARTIAL HEMILAMINECTOMY;  Surgeon: Jessy Oto, MD;  Location: [facility];  Service: Orthopedics;  Laterality: N/A;  ? UPPER GASTROINTESTINAL ENDOSCOPY    ? UTERINE FIBROID SURGERY    ? ? ?Family History  ?Problem Relation Age of Onset  ? Hypertension Mother   ? Arthritis Mother   ? GER disease Mother   ? Glaucoma Mother   ? Breast cancer Mother   ? Stroke Father   ? Breast cancer Sister   ?     breast cancer dx late 38s  ? Hypertension Sister   ? Colon cancer Brother 57  ? Diabetes Brother   ? Diabetes Maternal Aunt   ? Heart disease Maternal Aunt   ? Heart disease Maternal Grandmother   ? Heart disease Maternal Grandfather   ? Heart disease Maternal Uncle   ? Stroke Maternal Uncle   ? Leukemia Sister   ? Hypertension Sister   ? Healthy Son   ? Colon polyps Neg Hx   ? Esophageal cancer Neg Hx   ? Stomach cancer Neg Hx   ? Rectal cancer Neg Hx   ? ? ?Social History:  reports that she has never smoked. She has never used smokeless tobacco. She reports that she does not drink alcohol and does not use  drugs. ? ?Allergies:  ?Allergies  ?Allergen Reactions  ? Kiwi Extract Hives and Itching  ? Mucinex Dm [Dm-Guaifenesin Er] Nausea Only  ?  Messes up her stomach. Pt report on 01/21/15  ? Peach [Prunus Persica] Itching  ?  Peach peeling makes pt itch  ? ? ?Medications Prior to Admission  ?Medication Sig Dispense Refill Last Dose  ? carvedilol (COREG) 6.25 MG tablet Take 1 tablet (6.25 mg total) by mouth 2 (two) times daily. 180 tablet 3 07/14/2021 at 0730  ? Cholecalciferol (VITAMIN D) 50 MCG (2000 UT) CAPS Take 1 capsule (2,000 Units total) by mouth daily. 90 capsule 3 07/14/2021 at 0730  ? DEXILANT 60 MG capsule Take 1 capsule (60 mg total) by mouth daily. 90 capsule 3 07/14/2021 at 0730  ? diclofenac Sodium (VOLTAREN) 1 % GEL APPLY 2 GRAMS TOPICALLY 4 TIMES DAILY 100 g 0 Past Month  ? ENTRESTO 49-51 MG Take 1  tablet by mouth 2 (two) times daily.   07/14/2021  ? EQ ASPIRIN ADULT LOW DOSE 81 MG EC tablet Take 1 tablet by mouth once daily 90 tablet 0 07/09/2021  ? etodolac (LODINE) 400 MG tablet Take 400 mg by mouth as needed.   07/14/2021 at 0730  ? famotidine (PEPCID) 20 MG tablet Take 20 mg by mouth 2 (two) times daily.   Past Month  ? fexofenadine (ALLEGRA ALLERGY) 180 MG tablet Take 1 tablet (180 mg total) by mouth daily. 90 tablet 0 Past Month  ? fluticasone (FLONASE) 50 MCG/ACT nasal spray Place 2 sprays into both nostrils daily. 15 mL 2 Past Month  ? gabapentin (NEURONTIN) 300 MG capsule TAKE 1 CAPSULE BY MOUTH THREE TIMES DAILY 90 capsule 5 07/14/2021 at 0730  ? hydroxychloroquine (PLAQUENIL) 200 MG tablet TAKE 1 TABLET BY MOUTH TWICE DAILY FOR RHEUMATOID ARTHRITIS 180 tablet 0 07/13/2021  ? linaclotide (LINZESS) 145 MCG CAPS capsule Take 1 capsule (145 mcg total) by mouth daily before breakfast. 90 capsule 1 07/13/2021  ? ondansetron (ZOFRAN) 4 MG tablet TAKE 1 TABLET BY MOUTH EVERY 8 HOURS AS NEEDED FOR NAUSEA OR VOMITING 90 tablet 0 Past Month  ? potassium chloride (KLOR-CON) 10 MEQ tablet Take 1 tablet (10 mEq total) by mouth 2 (two) times daily. (Patient taking differently: Take 10 mEq by mouth daily.) 180 tablet 3 07/13/2021  ? rosuvastatin (CRESTOR) 20 MG tablet Take 1 tablet (20 mg total) by mouth daily. 90 tablet 3 07/14/2021 at 0730  ? spironolactone (ALDACTONE) 25 MG tablet Take 25 mg by mouth daily.   07/14/2021 at 0730  ? TURMERIC PO Take by mouth.   Past Week  ? venlafaxine XR (EFFEXOR-XR) 37.5 MG 24 hr capsule TAKE 1 CAPSULE DAILY WITH BREAKFAST. 90 capsule 1 07/14/2021 at 0730  ? blood glucose meter kit and supplies KIT Test once daily-[id number] 1 each 0   ? Glucose Blood (BLOOD GLUCOSE TEST STRIPS) STRP Inject 1 each as directed daily. 100 strip 5   ? Insulin Pen Needle (BD PEN NEEDLE NANO U/F) 32G X 4 MM MISC 1 each by Does not apply route at bedtime. 100 each 11   ? Misc. Devices (ROLLATOR ULTRA-LIGHT) MISC  Use as directed. 1 each 0   ? ? ?Review of Systems ? ?Blood pressure (!) 147/88, pulse 77, temperature 98.5 ?F (36.9 ?C), temperature source Oral, resp. rate 18, height _0  (1.753 m), weight 108.5 kg, last menstrual period 02/23/2015, SpO2 100 %. ?Physical Exam ?Physical exam:  ?A&O x 3, no acute distress. Pleasant ?  HEENT neg, no thyromegaly ?Lungs CTA bilat ?CV RRR, S1S2 normal ?Abdo soft, non tender, non acute ?Extr no edema/ tenderness ?Pelvic Cx stenotic, normal, uterus normal size, AV, no adnexal masses  ? ?Results for orders placed or performed during the hospital encounter of 07/14/21 (from the past 24 hour(s))  ?Glucose, capillary     Status: None  ? Collection Time: 07/14/21 12:05 PM  ?Result Value Ref Range  ? Glucose-Capillary 81 70 - 99 mg/dL  ? ?*Note: Due to a large number of results and/or encounters for the requested time period, some results have not been displayed. A complete set of results can be found in Results Review.  ? ? ?No results found. ? ?Assessment/Plan: ?61 yo female with recurrent vaginal bleeding. Here for hysteroscopy, myosure resection of fibroid and D&C  ?Risks/complications of surgery reviewed incl infection, bleeding, damage to internal organs including bladder, bowels, ureters, blood vessels, other risks from anesthesia, VTE and delayed complications of any surgery, complications in future surgery reviewed. .   ? ? ?Lisa Briggs ?07/14/2021, 1:05 PM ? ?

## 2021-07-14 NOTE — Discharge Instructions (Addendum)
?  Post Anesthesia Home Care Instructions ? ?Activity: ?Get plenty of rest for the remainder of the day. A responsible individual must stay with you for 24 hours following the procedure.  ?For the next 24 hours, DO NOT: ?-Drive a car ?-Paediatric nurse ?-Drink alcoholic beverages ?-Take any medication unless instructed by your physician ?-Make any legal decisions or sign important papers. ? ?Meals: ?Start with liquid foods such as gelatin or soup. Progress to regular foods as tolerated. Avoid greasy, spicy, heavy foods. If nausea and/or vomiting occur, drink only clear liquids until the nausea and/or vomiting subsides. Call your physician if vomiting continues. ? ?Special Instructions/Symptoms: ?Your throat may feel dry or sore from the anesthesia or the breathing tube placed in your throat during surgery. If this causes discomfort, gargle with warm salt water. The discomfort should disappear within 24 hours. ? ?DISCHARGE INSTRUCTIONS: HYSTEROSCOPY / ENDOMETRIAL ABLATION ?The following instructions have been prepared to help you care for yourself upon your return home. ? ?May take Ibuprofen after 8 PM. ? ?May take stool softner while taking narcotic pain medication to prevent constipation.  Drink plenty of water. ? ?Personal hygiene: ? Use sanitary pads for vaginal drainage, not tampons. ? Shower the day after your procedure. ? NO tub baths, pools or Jacuzzis for 2-3 weeks. ? Wipe front to back after using the bathroom. ? ?Activity and limitations: ? Do NOT drive or operate any equipment for 24 hours. The effects of anesthesia are still present ?and drowsiness may result. ? Do NOT rest in bed all day. ? Walking is encouraged. ? Walk up and down stairs slowly. ? You may resume your normal activity in one to two days or as indicated by your physician. ?Sexual activity: NO intercourse for at least 2 weeks after the procedure, or as indicated by your ?Doctor. ? ?Diet: Eat a light meal as desired this evening. You may  resume your usual diet tomorrow. ? ?Return to Work: You may resume your work activities in one to two days or as indicated by your ?Doctor. ? ?What to expect after your surgery: Expect to have vaginal bleeding/discharge for 2-3 days and ?spotting for up to 10 days. It is not unusual to have soreness for up to 1-2 weeks. You may have a ?slight burning sensation when you urinate for the first day. Mild cramps may continue for a couple of ?days. You may have a regular period in 2-6 weeks. ? ?Call your doctor for any of the following: ? Excessive vaginal bleeding or clotting, saturating and changing one pad every hour. ? Inability to urinate 6 hours after discharge from hospital. ? Pain not relieved by pain medication. ? Fever of 100.4? F or greater. ? Unusual vaginal discharge or odor. ? ? May take Tylenol beginning at 6 PM as needed for cramping/soreness. ?

## 2021-07-14 NOTE — Transfer of Care (Signed)
Immediate Anesthesia Transfer of Care Note ? ?[name] ? ?Procedure(s) Performed: Procedure(s) (LRB): ?DILATATION & CURETTAGE/HYSTEROSCOPY WITH MYOSURE Reach (N/A) ? ?Patient Location: PACU ? ?Anesthesia Type: General ? ?Level of Consciousness: awake, sedated, patient cooperative and responds to stimulation ? ?Airway & Oxygen Therapy: Patient Spontanous Breathing and Patient connected to  02  ? ?Post-op Assessment: Report given to PACU RN, Post -op Vital signs reviewed and stable and Patient moving all extremities ? ?Post vital signs: Reviewed and stable ? ?Complications: No apparent anesthesia complications ?

## 2021-07-14 NOTE — Anesthesia Preprocedure Evaluation (Addendum)
Anesthesia Evaluation  ?Patient identified by MRN, date of birth, ID band ?Patient awake ? ? ? ?Reviewed: ?Allergy & Precautions, NPO status , Patient's Chart, lab work & pertinent test results, reviewed documented beta blocker date and time  ? ?History of Anesthesia Complications ?Negative for: history of anesthetic complications ? ?Airway ?Mallampati: III ? ?TM Distance: >3 FB ?Neck ROM: Full ? ? ? Dental ? ?(+) Dental Advisory Given, Teeth Intact ?  ?Pulmonary ?neg pulmonary ROS,  ?  ?Pulmonary exam normal ? ? ? ? ? ? ? Cardiovascular ?hypertension, Pt. on medications and Pt. on home beta blockers ?Normal cardiovascular exam ? ? ?Echo 2016: EF 60-65%, no RWMA, normal diastolic function, mild MR, mod TR  ? ?Protivin 2021: normal perfusion, normal wall motion, normal EF ?  ?Neuro/Psych ?Anxiety negative neurological ROS ?   ? GI/Hepatic ?Neg liver ROS, GERD  Medicated,GIST s/p resection ?  ?Endo/Other  ?negative endocrine ROS ? Renal/GU ?negative Renal ROS  ?negative genitourinary ?  ?Musculoskeletal ? ?(+) Arthritis , Rheumatoid disorders,   ? Abdominal ?  ?Peds ? Hematology ?negative hematology ROS ?(+)   ?Anesthesia Other Findings ? ? Reproductive/Obstetrics ?Submucosal Myoma, Postmenopausal Bleeding ? ?  ? ? ? ? ? ? ? ? ? ? ? ? ? ?  ?  ? ? ? ? ? ? ?Anesthesia Physical ?Anesthesia Plan ? ?ASA: 2 ? ?Anesthesia Plan: General  ? ?Post-op Pain Management: Tylenol PO (pre-op)* and Toradol IV (intra-op)*  ? ?Induction: Intravenous ? ?PONV Risk Score and Plan: 3 and Ondansetron, Dexamethasone, Midazolam and Treatment may vary due to age or medical condition ? ?Airway Management Planned: LMA ? ?Additional Equipment: None ? ?Intra-op Plan:  ? ?Post-operative Plan: Extubation in OR ? ?Informed Consent: I have reviewed the patients History and Physical, chart, labs and discussed the procedure including the risks, benefits and alternatives for the proposed anesthesia with the patient or  authorized representative who has indicated his/her understanding and acceptance.  ? ? ? ?Dental advisory given ? ?Plan Discussed with:  ? ?Anesthesia Plan Comments:   ? ? ? ? ? ? ?Anesthesia Quick Evaluation ? ?

## 2021-07-14 NOTE — Op Note (Signed)
Lisa Briggs  ?07/14/2021 ? ?Preoperative diagnosis: Postmenopausal bleeding, submucosal myoma  ?Postop diagnosis: as above.  ?Procedure: Myosure Reach Hysteroscopic myomectomy and endometrial curettage  ?Anesthesia General via LMA and paracervical block  ?Surgeon: Azucena Fallen, MD  ?Assistant:NA ?IV fluids 600 cc LR ?Estimated blood loss 5 cc   ?Urine output: straight catheter preop  200 cc  ?Complications none  ?Condition stable  ?Disposition PACU  ?Specimen: fibroid fragments and endometrial curettings  ? ?Procedure  ?Indication: Recurrent postmenopausal bleeding. No HRT use. Endometrial stripe 7 mm and 1.2 cm submucosal myoma on office sonogram. Patient was counseled on risks/ complications including infection, bleeding, damage to internal organs, she understood and agrees, gave informed written consent.  ?Patient was brought to the operating room with IV running. Time out was carried out. She received preop 2 gm Ancef. She underwent general anesthesia via LMA without complications. She was given dorsolithotomy position. Parts were prepped and draped in standard fashion. Bladder was drained once. Bimanual exam revealed uterus to be anteverted and normal size. Speculum was placed and cervix was grasped with single-tooth tenaculum. Cervical block with 10 cc 1% Xylocaine with epinephrine given. The uterus was sounded to 7 cm. Cervical os was dilated to 18 [place] dilator. Hysteroscope was introduced in the uterine cavity under vision, using saline for irrigation.  ?Findings: Hyperemic endometrium. 1 cm submucous myoma and endocervical polypoid mass (1 cm) noted. Both tubal ostii seen well and normal.  ** ?Hysteroscopic myomectomy and endometrial curetting performed with Myosure Reach under vision. Specimen sent to path. Hysteroscope was removed. Endometrial curettage was performed again with sharp curette. ? ?Fluid deficit 60 cc ?All counts are correct x2. No complications. Patient brought to the recovery room in  stable condition.  ?Patient will be discharged home today. Follow up in 2 weeks in office. Warning signs of infection and excessive bleeding reviewed.  ?I performed this surgery.  ? ?Azucena Fallen, MD.   ?[facility]  ?

## 2021-07-14 NOTE — Anesthesia Procedure Notes (Signed)
Procedure Name: LMA Insertion ?Date/Time: 07/14/2021 1:25 PM ?Performed by: Justice Rocher, CRNA ?Pre-anesthesia Checklist: Patient identified, Emergency Drugs available, Suction available, Patient being monitored and Timeout performed ?Patient Re-evaluated:Patient Re-evaluated prior to induction ?Oxygen Delivery Method: Circle system utilized ?Preoxygenation: Pre-oxygenation with 100% oxygen ?Induction Type: IV induction ?Ventilation: Mask ventilation without difficulty ?LMA: LMA inserted ?LMA Size: 4.0 ?Number of attempts: 1 ?Airway Equipment and Method: Bite block ?Placement Confirmation: positive ETCO2, breath sounds checked- equal and bilateral and CO2 detector ?Tube secured with: Tape ?Dental Injury: Teeth and Oropharynx as per pre-operative assessment  ? ? ? ? ?

## 2021-07-15 LAB — SURGICAL PATHOLOGY

## 2021-07-15 NOTE — Anesthesia Postprocedure Evaluation (Signed)
Anesthesia Post Note ? ?[name] ? ?Procedure(s) Performed: DILATATION & CURETTAGE/HYSTEROSCOPY WITH MYOSURE Reach ? ?  ? ?Patient location during evaluation: PACU ?Anesthesia Type: General ?Level of consciousness: awake and alert ?Pain management: pain level controlled ?Vital Signs Assessment: post-procedure vital signs reviewed and stable ?Respiratory status: spontaneous breathing, nonlabored ventilation and respiratory function stable ?Cardiovascular status: blood pressure returned to baseline and stable ?Postop Assessment: no apparent nausea or vomiting ?Anesthetic complications: no ? ? ?No notable events documented. ? ?Last Vitals:  ?Vitals:  ? 07/14/21 1500 07/14/21 1530  ?BP: (!) 162/89 (!) 151/91  ?Pulse: (!) 57 (!) 55  ?Resp: 16 15  ?Temp:  (!) 36.4 ?C  ?SpO2: 98% 97%  ?  ?Last Pain:  ?Vitals:  ? 07/15/21 0941  ?TempSrc:   ?PainSc: 0-No pain  ? ? ?  ?  ?  ?  ?  ?  ? ?Lidia Collum ? ? ? ? ?

## 2021-07-18 ENCOUNTER — Encounter (HOSPITAL_BASED_OUTPATIENT_CLINIC_OR_DEPARTMENT_OTHER): Payer: Self-pay | Admitting: Obstetrics & Gynecology

## 2021-07-28 ENCOUNTER — Ambulatory Visit (INDEPENDENT_AMBULATORY_CARE_PROVIDER_SITE_OTHER): Payer: Medicare HMO | Admitting: Medical

## 2021-07-28 ENCOUNTER — Encounter: Payer: Self-pay | Admitting: Medical

## 2021-07-28 ENCOUNTER — Other Ambulatory Visit: Payer: Self-pay | Admitting: Medical

## 2021-07-28 ENCOUNTER — Other Ambulatory Visit: Payer: Self-pay

## 2021-07-28 VITALS — BP 124/82 | HR 78 | Temp 98.5°F | Wt 248.8 lb

## 2021-07-28 DIAGNOSIS — M51369 Other intervertebral disc degeneration, lumbar region without mention of lumbar back pain or lower extremity pain: Secondary | ICD-10-CM

## 2021-07-28 DIAGNOSIS — M5136 Other intervertebral disc degeneration, lumbar region: Secondary | ICD-10-CM | POA: Diagnosis not present

## 2021-07-28 DIAGNOSIS — R7301 Impaired fasting glucose: Secondary | ICD-10-CM | POA: Diagnosis not present

## 2021-07-28 DIAGNOSIS — I10 Essential (primary) hypertension: Secondary | ICD-10-CM | POA: Diagnosis not present

## 2021-07-28 DIAGNOSIS — Z7185 Encounter for immunization safety counseling: Secondary | ICD-10-CM | POA: Diagnosis not present

## 2021-07-28 DIAGNOSIS — E2839 Other primary ovarian failure: Secondary | ICD-10-CM

## 2021-07-28 DIAGNOSIS — Z23 Encounter for immunization: Secondary | ICD-10-CM | POA: Diagnosis not present

## 2021-07-28 DIAGNOSIS — K219 Gastro-esophageal reflux disease without esophagitis: Secondary | ICD-10-CM

## 2021-07-28 DIAGNOSIS — Z780 Asymptomatic menopausal state: Secondary | ICD-10-CM | POA: Diagnosis not present

## 2021-07-28 DIAGNOSIS — E559 Vitamin D deficiency, unspecified: Secondary | ICD-10-CM

## 2021-07-28 DIAGNOSIS — I5022 Chronic systolic (congestive) heart failure: Secondary | ICD-10-CM

## 2021-07-28 DIAGNOSIS — I429 Cardiomyopathy, unspecified: Secondary | ICD-10-CM | POA: Diagnosis not present

## 2021-07-28 DIAGNOSIS — E7800 Pure hypercholesterolemia, unspecified: Secondary | ICD-10-CM

## 2021-07-28 DIAGNOSIS — M0579 Rheumatoid arthritis with rheumatoid factor of multiple sites without organ or systems involvement: Secondary | ICD-10-CM

## 2021-07-28 MED ORDER — FLUTICASONE PROPIONATE 50 MCG/ACT NA SUSP: 2.0000 | Freq: Every day | NASAL | 2 refills | Status: DC

## 2021-07-28 MED ORDER — FEXOFENADINE HCL 180 MG PO TABS
180.0000 mg | ORAL_TABLET | Freq: Every day | ORAL | 0 refills | Status: DC
Start: 2021-07-28 — End: 2022-01-02

## 2021-07-28 NOTE — Patient Instructions (Signed)
   Please call to schedule your bone density   [facility]  [phone] [address], [address] [place], [place] [address]   

## 2021-07-28 NOTE — Progress Notes (Signed)
Subjective: ? Lisa Briggs is a 62 y.o. female who presents for ?Chief Complaint  ?Patient presents with  ? other  ?  Med check   ?   ?Here for med check.  She had surgery recently with gynecology for fibroids and bleeding.  She had a follow-up with them today.  She has had minimal bleeding after the surgery.  She feels fine today.  No particular issues. ? ?She just started back with exercise. ? ?She notes in recent months having a tetanus vaccine through [facility] on [address] ?She notes compliance to medications ? ?She has not had her up-to-date bone density test.  She is inquiring about this. ? ?No other aggravating or relieving factors.   ? ?No other c/o. ? ?Past Medical History:  ?Diagnosis Date  ? Allergy   ? Anemia   ? iron therapy for years as of 10/12; normal Hgb 08/2013  ? Anxiety   ? Arthritis   ? Cardiomyopathy ([place])   ? Chest pain 04/05/2011  ? cardiac eval, normal treadmill stress test, Dr. Tollie Eth  ? Chronic back pain   ? Constipation   ? Farsightedness   ? wears glasses, Eye care center  ? 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 Dr. [name], [facility]  ? GERD (gastroesophageal reflux disease)   ? History of uterine fibroid   ? Hyperlipidemia   ? Hypertension   ? Paresthesia 09/2014  ? initially thought to be TIA, neurology consult in 12/2014 with other non TIA considerations.    ? Polyarthralgia   ? normal rheumatoid screen 01/2012  ? TIA (transient ischemic attack) 2015/16  ? ?Current Outpatient Medications on File Prior to Visit  ?Medication Sig Dispense Refill  ? blood glucose meter kit and supplies KIT Test once daily-[id number] 1 each 0  ? carvedilol (COREG) 6.25 MG tablet Take 1 tablet (6.25 mg total) by mouth 2 (two) times daily. 180 tablet 3  ? Cholecalciferol (VITAMIN D) 50 MCG (2000 UT) CAPS Take 1 capsule (2,000 Units total) by mouth daily. 90 capsule 3  ? DEXILANT 60 MG capsule Take 1 capsule (60 mg total) by mouth daily. 90 capsule 3  ?  diclofenac Sodium (VOLTAREN) 1 % GEL APPLY 2 GRAMS TOPICALLY 4 TIMES DAILY 100 g 0  ? ENTRESTO 49-51 MG Take 1 tablet by mouth 2 (two) times daily.    ? EQ ASPIRIN ADULT LOW DOSE 81 MG EC tablet Take 1 tablet by mouth once daily 90 tablet 0  ? etodolac (LODINE) 400 MG tablet Take 400 mg by mouth as needed.    ? famotidine (PEPCID) 20 MG tablet Take 20 mg by mouth 2 (two) times daily.    ? gabapentin (NEURONTIN) 300 MG capsule TAKE 1 CAPSULE BY MOUTH THREE TIMES DAILY 90 capsule 5  ? Glucose Blood (BLOOD GLUCOSE TEST STRIPS) STRP Inject 1 each as directed daily. 100 strip 5  ? hydroxychloroquine (PLAQUENIL) 200 MG tablet TAKE 1 TABLET BY MOUTH TWICE DAILY FOR RHEUMATOID ARTHRITIS 180 tablet 0  ? Insulin Pen Needle (BD PEN NEEDLE NANO U/F) 32G X 4 MM MISC 1 each by Does not apply route at bedtime. 100 each 11  ? linaclotide (LINZESS) 145 MCG CAPS capsule Take 1 capsule (145 mcg total) by mouth daily before breakfast. 90 capsule 1  ? Misc. Devices (ROLLATOR ULTRA-LIGHT) MISC Use as directed. 1 each 0  ? ondansetron (ZOFRAN) 4 MG tablet TAKE 1 TABLET BY MOUTH EVERY 8 HOURS AS  NEEDED FOR NAUSEA OR VOMITING 90 tablet 0  ? potassium chloride (KLOR-CON) 10 MEQ tablet Take 1 tablet (10 mEq total) by mouth 2 (two) times daily. (Patient taking differently: Take 10 mEq by mouth daily.) 180 tablet 3  ? rosuvastatin (CRESTOR) 20 MG tablet Take 1 tablet (20 mg total) by mouth daily. 90 tablet 3  ? spironolactone (ALDACTONE) 25 MG tablet Take 25 mg by mouth daily.    ? TURMERIC PO Take by mouth.    ? venlafaxine XR (EFFEXOR-XR) 37.5 MG 24 hr capsule TAKE 1 CAPSULE DAILY WITH BREAKFAST. 90 capsule 1  ? ?No current facility-administered medications on file prior to visit.  ? ? ? ?The following portions of the patient's history were reviewed and updated as appropriate: allergies, current medications, past family history, past medical history, past social history, past surgical history and problem list. ? ?ROS ?Otherwise as in  subjective above ? ?Objective: ?BP 124/82   Pulse 78   Temp 98.5 ?F (36.9 ?C)   Wt 248 lb 12.8 oz (112.9 kg)   LMP 02/23/2015 (LMP Unknown)   BMI 36.74 kg/m?  ? ?General appearance: alert, no distress, well developed, well nourished ?Neck: supple, no lymphadenopathy, no thyromegaly, no masses ?Heart: RRR, normal S1, S2, no murmurs ?Lungs: CTA bilaterally, no wheezes, rhonchi, or rales ?Pulses: 2+ radial pulses, 2+ pedal pulses, normal cap refill ?Ext: no edema ? ? ?Assessment: ?Encounter Diagnoses  ?Name Primary?  ? Chronic HFrEF (heart failure with reduced ejection fraction) ([place]) Yes  ? DDD (degenerative disc disease), lumbar   ? Estrogen deficiency   ? Menopause   ? Need for Streptococcus pneumoniae vaccination   ? Vitamin D deficiency   ? Vaccine counseling   ? Rheumatoid arthritis involving multiple sites with positive rheumatoid factor ([place])   ? Impaired fasting blood sugar   ? Pure hypercholesterolemia   ? Essential hypertension   ? Cardiomyopathy, unspecified type ([place])   ? Gastroesophageal reflux disease without esophagitis   ? ? ? ?Plan: ?Problem List Items Addressed This Visit   ? ? Gastroesophageal reflux disease without esophagitis  ?  Continue Dexilant 60 mg daily, Pepcid 20 mg daily, stable ? ?  ?  ? Essential hypertension  ?  Continue current medication ? ?  ?  ? Hyperlipidemia  ?  Continue statin rosuvastatin 20 mg daily, reviewed lipid labs which were done less than 12 months ago ? ?  ?  ? Vitamin D deficiency  ?  Continue supplement ? ?  ?  ? Estrogen deficiency  ? Relevant Orders  ? DG Bone Density  ? Impaired fasting blood sugar  ?  Stable, her coronaries to lose weight through healthy diet and exercise.  I reviewed most recent labs in the chart which are stable ? ?  ?  ? Vaccine counseling  ?  Counseled on the pneumococcal vaccine.  Vaccine information sheet given.  Pneumococcal vaccine Prevnar 20 given after consent obtained. ? ?We will try to get a copy of her recent tetanus booster at  pharmacy ?  ?  ? DDD (degenerative disc disease), lumbar  ? Relevant Orders  ? DG Bone Density  ? Cardiomyopathy ([place])  ?  Reviewed her most recent cardiology notes, continue current therapies ? ?  ?  ? Rheumatoid arthritis involving multiple sites with positive rheumatoid factor ([place])  ?  Continue routine follow-up with rheumatology ? ?  ?  ? Menopause  ? Relevant Orders  ? DG Bone Density  ? Chronic  HFrEF (heart failure with reduced ejection fraction) ([place]) - Primary  ?  I reviewed her March 2023 cardiology notes.  She was continued on current therapy.  Continue Entresto 49/51 mg daily, carvedilol 6.25 mg twice daily, statin, spironolactone 25 mg daily ? ?  ?  ? ?Other Visit Diagnoses   ? ? Need for Streptococcus pneumoniae vaccination      ? Relevant Orders  ? Pneumococcal conjugate vaccine 20-valent (Prevnar 20) (Completed)  ? ?  ? ? ?Follow up: 01/2022 for physical ?

## 2021-07-29 DIAGNOSIS — I5022 Chronic systolic (congestive) heart failure: Secondary | ICD-10-CM | POA: Insufficient documentation

## 2021-07-29 NOTE — Assessment & Plan Note (Signed)
Continue Dexilant 60 mg daily, Pepcid 20 mg daily, stable ?

## 2021-07-29 NOTE — Assessment & Plan Note (Signed)
Continue supplement. 

## 2021-07-29 NOTE — Assessment & Plan Note (Signed)
Continue current medication.

## 2021-07-29 NOTE — Assessment & Plan Note (Signed)
Reviewed her most recent cardiology notes, continue current therapies ?

## 2021-07-29 NOTE — Assessment & Plan Note (Addendum)
Continue statin rosuvastatin 20 mg daily, reviewed lipid labs which were done less than 12 months ago ?

## 2021-07-29 NOTE — Assessment & Plan Note (Signed)
Stable, her coronaries to lose weight through healthy diet and exercise.  I reviewed most recent labs in the chart which are stable ?

## 2021-07-29 NOTE — Assessment & Plan Note (Signed)
Continue routine follow-up with rheumatology ?

## 2021-07-29 NOTE — Assessment & Plan Note (Signed)
I reviewed her March 2023 cardiology notes.  She was continued on current therapy.  Continue Entresto 49/51 mg daily, carvedilol 6.25 mg twice daily, statin, spironolactone 25 mg daily ?

## 2021-07-29 NOTE — Assessment & Plan Note (Signed)
Counseled on the pneumococcal vaccine.  Vaccine information sheet given.  Pneumococcal vaccine Prevnar 20 given after consent obtained. ? ?We will try to get a copy of her recent tetanus booster at pharmacy ?

## 2021-08-02 ENCOUNTER — Ambulatory Visit
Admission: RE | Admit: 2021-08-02 | Discharge: 2021-08-02 | Disposition: A | Discharge status: Home / Self Care | Payer: Medicare HMO | Source: Ambulatory Visit | Attending: Medical | Admitting: Medical

## 2021-08-02 DIAGNOSIS — Z1231 Encounter for screening mammogram for malignant neoplasm of breast: Secondary | ICD-10-CM | POA: Diagnosis not present

## 2021-08-02 NOTE — Progress Notes (Signed)
? ?Office Visit Note ? ?[name]             ?Date of Birth: [birth year]           ?MRN: [id number]             ?PCP: Carlena Hurl, PA-C ?Referring: Carlena Hurl, PA-C ?Visit Date: 08/16/2021 ?Occupation: '@GUAROCC'$ @ ? ?Subjective:  ?Intermittent arthralgias  ? ?History of Present Illness: Lisa Briggs is a 61 y.o. female with history of seropositive rheumatoid arthritis, osteoarthritis, and DDD. She is currently taking plaquenil 200 mg 1 tablet by mouth twice daily.  She continues to tolerate Plaquenil without any side effects and has not missed any doses recently.  She has been experiencing increased arthralgias and joint stiffness involving multiple joints.  She notices intermittent pain and swelling in both hands and states that she has flares every other day.  Her pain and swelling is most severe in her right hand.  She has tried using Voltaren gel as well as taking Tylenol for symptomatic relief.  She continues to have intermittent discomfort in the left ankle joint from a previous fracture. ? ? ? ?Activities of Daily Living:  ?Patient reports morning stiffness for all day. ?Patient Reports nocturnal pain.  ?Difficulty dressing/grooming: Denies ?Difficulty climbing stairs: Reports ?Difficulty getting out of chair: Reports ?Difficulty using hands for taps, buttons, cutlery, and/or writing: Reports ? ?Review of Systems  ?Constitutional:  Positive for fatigue.  ?HENT:  Positive for mouth dryness and nose dryness. Negative for mouth sores.   ?Eyes:  Positive for dryness. Negative for pain and itching.  ?Respiratory:  Negative for shortness of breath and difficulty breathing.   ?Cardiovascular:  Negative for chest pain and palpitations.  ?Gastrointestinal:  Positive for constipation. Negative for blood in stool and diarrhea.  ?Endocrine: Negative for increased urination.  ?Genitourinary:  Negative for difficulty urinating.  ?Musculoskeletal:  Positive for joint pain, joint pain, joint  swelling, myalgias, morning stiffness, muscle tenderness and myalgias.  ?Skin:  Negative for color change, rash and redness.  ?Allergic/Immunologic: Negative for susceptible to infections.  ?Neurological:  Positive for headaches. Negative for dizziness, numbness, memory loss and weakness.  ?Hematological:  Positive for bruising/bleeding tendency.  ?Psychiatric/Behavioral:  Negative for confusion.   ? ?PMFS History:  ?Patient Active Problem List  ? Diagnosis Date Noted  ? Chronic HFrEF (heart failure with reduced ejection fraction) ([place]) 07/29/2021  ? Advance directive discussed with patient 01/27/2021  ? Medicare annual wellness visit, subsequent 01/27/2021  ? Needs flu shot 01/27/2021  ? At moderate risk for fall 01/27/2021  ? Prediabetes 02/19/2020  ? Menopause 11/25/2019  ? Cardiomyopathy ([place]) 11/19/2019  ? Rheumatoid arthritis involving multiple sites with positive rheumatoid factor ([place]) 11/19/2019  ? Primary osteoarthritis involving multiple joints 11/19/2019  ? Greater trochanteric bursitis, right 11/19/2019  ? Decreased activities of daily living (ADL) 11/19/2019  ? Gait disturbance 11/19/2019  ? Use of cane as ambulatory aid 11/19/2019  ? 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  ?  Class: Chronic  ? Spinal stenosis of lumbar region 04/25/2019  ?  Class: Chronic  ? Status post lumbar laminectomy 04/25/2019  ? Chronic pain of right knee 09/13/2018  ? Chronic pain of left knee 07/08/2018  ? Chronic hip pain 07/08/2018  ? Edema 07/08/2018  ? Need for shingles vaccine 12/05/2017  ? Primary osteoarthritis of both hands 11/23/2017  ? Primary osteoarthritis of both knees 11/23/2017  ? Primary osteoarthritis of both feet 11/23/2017  ? DDD (  degenerative disc disease), lumbar 11/23/2017  ? Vaccine counseling 08/13/2017  ? Sensitive skin 01/23/2017  ? Need for influenza vaccination 01/23/2017  ? History of gastrointestinal stromal tumor (GIST) 04/20/2016  ? History of TIA (transient ischemic attack)  04/20/2016  ? Screening for breast cancer 04/20/2016  ? Estrogen deficiency 04/20/2016  ? Impaired fasting blood sugar 04/20/2016  ? Constipation 04/20/2016  ? History of fall 04/20/2016  ? Chronic radicular lumbar pain 01/27/2016  ? Encounter for health maintenance examination in adult 03/22/2015  ? Paresthesia 03/22/2015  ? Cognitive decline 03/22/2015  ? Screening for cervical cancer 03/22/2015  ? Vitamin D deficiency 03/22/2015  ? History of uterine leiomyoma 03/22/2015  ? Gastroesophageal reflux disease without esophagitis 03/02/2014  ? Chronic nausea 03/02/2014  ? Rhinitis, allergic 03/02/2014  ? Essential hypertension 03/02/2014  ? Hyperlipidemia 03/02/2014  ? Obesity with serious comorbidity 01/16/2012  ?  ?Past Medical History:  ?Diagnosis Date  ? Allergy   ? Anemia   ? iron therapy for years as of 10/12; normal Hgb 08/2013  ? Anxiety   ? Arthritis   ? Cardiomyopathy ([place])   ? Chest pain 04/05/2011  ? cardiac eval, normal treadmill stress test, Dr. Tollie Eth  ? Chronic back pain   ? Constipation   ? Farsightedness   ? wears glasses, Eye care center  ? 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 Dr. [name], [facility]  ? GERD (gastroesophageal reflux disease)   ? History of uterine fibroid   ? Hyperlipidemia   ? Hypertension   ? Paresthesia 09/2014  ? initially thought to be TIA, neurology consult in 12/2014 with other non TIA considerations.    ? Polyarthralgia   ? normal rheumatoid screen 01/2012  ? TIA (transient ischemic attack) 2015/16  ?  ?Family History  ?Problem Relation Age of Onset  ? Hypertension Mother   ? Arthritis Mother   ? GER disease Mother   ? Glaucoma Mother   ? Breast cancer Mother 43  ? Stroke Father   ? Breast cancer Sister 17  ? Hypertension Sister   ? Leukemia Sister   ? Hypertension Sister   ? Colon cancer Brother 84  ? Diabetes Brother   ? Diabetes Maternal Aunt   ? Heart disease Maternal Aunt   ? Heart disease Maternal Uncle   ? Stroke Maternal  Uncle   ? Heart disease Maternal Grandmother   ? Heart disease Maternal Grandfather   ? Healthy Son   ? Colon polyps Neg Hx   ? Esophageal cancer Neg Hx   ? Stomach cancer Neg Hx   ? Rectal cancer Neg Hx   ? ?Past Surgical History:  ?Procedure Laterality Date  ? COLONOSCOPY  01/2014  ? diverticulosis, othwerise normal - Dr. Owens Loffler  ? DILATATION & CURETTAGE/HYSTEROSCOPY WITH MYOSURE N/A 07/14/2021  ? Procedure: DILATATION & CURETTAGE/HYSTEROSCOPY WITH MYOSURE Re[place];  Surgeon: Azucena Fallen, MD;  Location: [facility];  Service: Gynecology;  Laterality: N/A;  ? ESOPHAGOGASTRODUODENOSCOPY  2013  ? Dr. Benson Norway, gastritis  ? ESOPHAGOGASTRODUODENOSCOPY  [id number]  ? EUS N/A 04/16/2014  ? Procedure: UPPER ENDOSCOPIC ULTRASOUND (EUS) LINEAR;  Surgeon: Milus Banister, MD;  Location: [facility];  Service: Endoscopy;  Laterality: N/A;  ? gall stone surgery    ? KNEE ARTHROSCOPY Left   ? LAPAROSCOPIC GASTRIC RESECTION N/A 06/09/2014  ? Procedure: LAPAROSCOPIC GASTRIC MASS RESECTION;  Surgeon: [name], MD;  Location: [facility];  Service: General;  Laterality: N/A;  ?  LIPOMA EXCISION    ? forehead  ? LUMBAR LAMINECTOMY N/A 04/25/2019  ? Procedure: LEFT L2-3 MICRODISCECTOMY, BILATERAL L5-S1 PARTIAL HEMILAMINECTOMY;  Surgeon: Jessy Oto, MD;  Location: [place] [facility];  Service: Orthopedics;  Laterality: N/A;  ? UPPER GASTROINTESTINAL ENDOSCOPY    ? UTERINE FIBROID SURGERY    ? ?Social History  ? ?Social History Narrative  ? Married, has 1 son in [place] and some grandchildren.  Glass blower/designer.  Active on job.  Does stretching and exercises daily as per physical therapy.  Works 12 hours daily.    ? ?Immunization History  ?Administered Date(s) Administered  ? Influenza,inj,Quad PF,6+ Mos 11/24/2013, 01/18/2015, 01/23/2017, 12/05/2017, 12/17/2018, 11/19/2019, 01/27/2021  ? PFIZER Comirnaty(Gray Top)Covid-19 Tri-Sucrose Vaccine 07/12/2020  ? PFIZER(Purple Top)SARS-COV-2 Vaccination 06/16/2019, 07/08/2019, 01/15/2020  ?  PNEUMOCOCCAL CONJUGATE-20 07/28/2021  ? Pneumococcal Polysaccharide-23 05/21/2015  ? Tdap 01/17/2011, 06/25/2021  ? Zoster Recombinat (Shingrix) 10/05/2017, 12/07/2017  ?  ? ?Objective: ?Vital Signs: BP 115/80 (

## 2021-08-05 ENCOUNTER — Other Ambulatory Visit: Payer: Self-pay | Admitting: Medical

## 2021-08-05 ENCOUNTER — Ambulatory Visit
Admission: RE | Admit: 2021-08-05 | Discharge: 2021-08-05 | Disposition: A | Discharge status: Home / Self Care | Payer: Medicare HMO | Source: Ambulatory Visit | Attending: Medical | Admitting: Medical

## 2021-08-05 DIAGNOSIS — Z780 Asymptomatic menopausal state: Secondary | ICD-10-CM

## 2021-08-05 DIAGNOSIS — M5136 Other intervertebral disc degeneration, lumbar region: Secondary | ICD-10-CM

## 2021-08-05 DIAGNOSIS — E2839 Other primary ovarian failure: Secondary | ICD-10-CM

## 2021-08-05 DIAGNOSIS — M85852 Other specified disorders of bone density and structure, left thigh: Secondary | ICD-10-CM | POA: Diagnosis not present

## 2021-08-16 ENCOUNTER — Ambulatory Visit (INDEPENDENT_AMBULATORY_CARE_PROVIDER_SITE_OTHER): Payer: Medicare HMO | Admitting: Physician Assistant

## 2021-08-16 ENCOUNTER — Encounter: Payer: Self-pay | Admitting: Physician Assistant

## 2021-08-16 VITALS — BP 115/80 | HR 71 | Ht 67.5 in | Wt 244.6 lb

## 2021-08-16 DIAGNOSIS — Z13820 Encounter for screening for osteoporosis: Secondary | ICD-10-CM

## 2021-08-16 DIAGNOSIS — M7061 Trochanteric bursitis, right hip: Secondary | ICD-10-CM | POA: Diagnosis not present

## 2021-08-16 DIAGNOSIS — Z8673 Personal history of transient ischemic attack (TIA), and cerebral infarction without residual deficits: Secondary | ICD-10-CM

## 2021-08-16 DIAGNOSIS — I10 Essential (primary) hypertension: Secondary | ICD-10-CM

## 2021-08-16 DIAGNOSIS — M7062 Trochanteric bursitis, left hip: Secondary | ICD-10-CM

## 2021-08-16 DIAGNOSIS — S82892D Other fracture of left lower leg, subsequent encounter for closed fracture with routine healing: Secondary | ICD-10-CM | POA: Diagnosis not present

## 2021-08-16 DIAGNOSIS — M67432 Ganglion, left wrist: Secondary | ICD-10-CM

## 2021-08-16 DIAGNOSIS — M5136 Other intervertebral disc degeneration, lumbar region: Secondary | ICD-10-CM | POA: Diagnosis not present

## 2021-08-16 DIAGNOSIS — K219 Gastro-esophageal reflux disease without esophagitis: Secondary | ICD-10-CM

## 2021-08-16 DIAGNOSIS — M19072 Primary osteoarthritis, left ankle and foot: Secondary | ICD-10-CM

## 2021-08-16 DIAGNOSIS — M19041 Primary osteoarthritis, right hand: Secondary | ICD-10-CM | POA: Diagnosis not present

## 2021-08-16 DIAGNOSIS — E559 Vitamin D deficiency, unspecified: Secondary | ICD-10-CM

## 2021-08-16 DIAGNOSIS — Z8639 Personal history of other endocrine, nutritional and metabolic disease: Secondary | ICD-10-CM | POA: Diagnosis not present

## 2021-08-16 DIAGNOSIS — M0579 Rheumatoid arthritis with rheumatoid factor of multiple sites without organ or systems involvement: Secondary | ICD-10-CM | POA: Diagnosis not present

## 2021-08-16 DIAGNOSIS — M19071 Primary osteoarthritis, right ankle and foot: Secondary | ICD-10-CM | POA: Diagnosis not present

## 2021-08-16 DIAGNOSIS — M19042 Primary osteoarthritis, left hand: Secondary | ICD-10-CM

## 2021-08-16 DIAGNOSIS — Z79899 Other long term (current) drug therapy: Secondary | ICD-10-CM

## 2021-08-16 DIAGNOSIS — M170 Bilateral primary osteoarthritis of knee: Secondary | ICD-10-CM | POA: Diagnosis not present

## 2021-08-16 DIAGNOSIS — M51369 Other intervertebral disc degeneration, lumbar region without mention of lumbar back pain or lower extremity pain: Secondary | ICD-10-CM

## 2021-08-16 DIAGNOSIS — M85852 Other specified disorders of bone density and structure, left thigh: Secondary | ICD-10-CM

## 2021-08-17 DIAGNOSIS — M1990 Unspecified osteoarthritis, unspecified site: Secondary | ICD-10-CM | POA: Diagnosis not present

## 2021-08-17 DIAGNOSIS — E669 Obesity, unspecified: Secondary | ICD-10-CM | POA: Diagnosis not present

## 2021-08-17 DIAGNOSIS — Z7969 Long term (current) use of other immunomodulators and immunosuppressants: Secondary | ICD-10-CM | POA: Diagnosis not present

## 2021-08-17 DIAGNOSIS — K219 Gastro-esophageal reflux disease without esophagitis: Secondary | ICD-10-CM | POA: Diagnosis not present

## 2021-08-17 DIAGNOSIS — M069 Rheumatoid arthritis, unspecified: Secondary | ICD-10-CM | POA: Diagnosis not present

## 2021-08-17 DIAGNOSIS — R69 Illness, unspecified: Secondary | ICD-10-CM | POA: Diagnosis not present

## 2021-08-17 DIAGNOSIS — Z6838 Body mass index (BMI) 38.0-38.9, adult: Secondary | ICD-10-CM | POA: Diagnosis not present

## 2021-08-17 DIAGNOSIS — K589 Irritable bowel syndrome without diarrhea: Secondary | ICD-10-CM | POA: Diagnosis not present

## 2021-08-17 DIAGNOSIS — I4891 Unspecified atrial fibrillation: Secondary | ICD-10-CM | POA: Diagnosis not present

## 2021-08-17 DIAGNOSIS — M5430 Sciatica, unspecified side: Secondary | ICD-10-CM | POA: Diagnosis not present

## 2021-08-17 DIAGNOSIS — E876 Hypokalemia: Secondary | ICD-10-CM | POA: Diagnosis not present

## 2021-08-17 DIAGNOSIS — I10 Essential (primary) hypertension: Secondary | ICD-10-CM | POA: Diagnosis not present

## 2021-08-22 ENCOUNTER — Telehealth: Payer: Self-pay | Admitting: Medical

## 2021-08-22 NOTE — Telephone Encounter (Signed)
Na

## 2021-08-22 NOTE — Telephone Encounter (Signed)
Pt came in and dropped off parking placard form to be completed. Sending back to be completed.

## 2021-08-23 ENCOUNTER — Encounter: Payer: Self-pay | Admitting: Family Medicine

## 2021-08-28 ENCOUNTER — Ambulatory Visit (HOSPITAL_COMMUNITY)
Admission: EM | Admit: 2021-08-28 | Discharge: 2021-08-28 | Disposition: A | Discharge status: Home / Self Care | Payer: Medicare HMO

## 2021-08-28 ENCOUNTER — Encounter (HOSPITAL_COMMUNITY): Payer: Self-pay

## 2021-08-28 ENCOUNTER — Emergency Department (HOSPITAL_COMMUNITY)
Admission: EM | Admit: 2021-08-28 | Discharge: 2021-08-28 | Disposition: A | Discharge status: Home / Self Care | Payer: Medicare HMO | Attending: Emergency Medicine | Admitting: Emergency Medicine

## 2021-08-28 ENCOUNTER — Emergency Department (HOSPITAL_COMMUNITY): Payer: Medicare HMO

## 2021-08-28 ENCOUNTER — Other Ambulatory Visit: Payer: Self-pay

## 2021-08-28 DIAGNOSIS — M62838 Other muscle spasm: Secondary | ICD-10-CM | POA: Insufficient documentation

## 2021-08-28 DIAGNOSIS — R0789 Other chest pain: Secondary | ICD-10-CM | POA: Diagnosis not present

## 2021-08-28 DIAGNOSIS — R519 Headache, unspecified: Secondary | ICD-10-CM | POA: Insufficient documentation

## 2021-08-28 DIAGNOSIS — R079 Chest pain, unspecified: Secondary | ICD-10-CM | POA: Diagnosis not present

## 2021-08-28 LAB — HEPATIC FUNCTION PANEL
ALT: 15 U/L (ref 0–44)
AST: 17 U/L (ref 15–41)
Albumin: 3.7 g/dL (ref 3.5–5.0)
Alkaline Phosphatase: 103 U/L (ref 38–126)
Bilirubin, Direct: <0.1 mg/dL (ref 0.0–0.2)
Total Bilirubin: 0.6 mg/dL (ref 0.3–1.2)
Total Protein: 7.2 g/dL (ref 6.5–8.1)

## 2021-08-28 LAB — BASIC METABOLIC PANEL
Anion gap: 7 (ref 5–15)
BUN: 20 mg/dL (ref 6–20)
CO2: 23 mmol/L (ref 22–32)
Calcium: 9.4 mg/dL (ref 8.9–10.3)
Chloride: 112 mmol/L — ABNORMAL HIGH (ref 98–111)
Creatinine, Ser: 0.83 mg/dL (ref 0.44–1.00)
GFR, Estimated: >60 mL/min (ref >60)
Glucose, Bld: 85 mg/dL (ref 70–99)
Potassium: 3.8 mmol/L (ref 3.5–5.1)
Sodium: 142 mmol/L (ref 135–145)

## 2021-08-28 LAB — CBC
HCT: 39.4 % (ref 36.0–46.0)
Hemoglobin: 12.5 g/dL (ref 12.0–15.0)
MCH: 27.3 pg (ref 26.0–34.0)
MCHC: 31.7 g/dL (ref 30.0–36.0)
MCV: 86 fL (ref 80.0–100.0)
Platelets: 279 10*3/uL (ref 150–400)
RBC: 4.58 MIL/uL (ref 3.87–5.11)
RDW: 13.6 % (ref 11.5–15.5)
WBC: 6.8 10*3/uL (ref 4.0–10.5)
nRBC: 0 % (ref 0.0–0.2)

## 2021-08-28 LAB — TROPONIN I (HIGH SENSITIVITY)
Troponin I (High Sensitivity): <2 ng/L (ref <18)
Troponin I (High Sensitivity): <2 ng/L (ref <18)

## 2021-08-28 LAB — LIPASE, BLOOD: Lipase: 27 U/L (ref 11–51)

## 2021-08-28 MED ORDER — FENTANYL CITRATE PF 50 MCG/ML IJ SOSY
50.0000 ug | PREFILLED_SYRINGE | Freq: Once | INTRAMUSCULAR | Status: AC
  Administered 2021-08-28: 50 ug via INTRAVENOUS
  Filled 2021-08-28: qty 1

## 2021-08-28 MED ORDER — SODIUM CHLORIDE (PF) 0.9 % IJ SOLN
INTRAMUSCULAR | Status: AC
  Filled 2021-08-28: qty 50

## 2021-08-28 MED ORDER — LIDOCAINE 5 % EX PTCH: 1.0000 | MEDICATED_PATCH | CUTANEOUS | 0 refills | Status: DC

## 2021-08-28 MED ORDER — CYCLOBENZAPRINE HCL 10 MG PO TABS: 10.0000 mg | ORAL_TABLET | Freq: Two times a day (BID) | ORAL | 0 refills | Status: DC | PRN

## 2021-08-28 MED ORDER — IOHEXOL 350 MG/ML SOLN
150.0000 mL | Freq: Once | INTRAVENOUS | Status: AC | PRN
  Administered 2021-08-28: 150 mL via INTRAVENOUS

## 2021-08-28 NOTE — ED Triage Notes (Signed)
Pt c/o midsternal CP radiating into R arm and neck area since approximately 1000 this morning. Pt denies SOB, N/V, diaphoresis.

## 2021-08-28 NOTE — ED Provider Notes (Signed)
[facility] DEPT Provider Note   CSN: [id number] Arrival date & time: 08/28/21  1705     History  Chief Complaint  Patient presents with   Chest Pain    ALIZABETH ANTONIO is a 61 y.o. female.  The history is provided by the patient and medical records. No language interpreter was used.  Chest Pain Pain location:  R chest Pain quality: aching, sharp and tightness   Pain radiates to:  Neck Pain severity:  Moderate Timing:  Constant Progression:  Waxing and waning Chronicity:  Recurrent Relieved by:  Nothing Worsened by:  Movement Ineffective treatments:  None tried Associated symptoms: headache   Associated symptoms: no abdominal pain, no altered mental status, no back pain, no cough, no diaphoresis, no dizziness, no fatigue, no fever, no nausea, no near-syncope, no numbness, no palpitations, no shortness of breath, no vomiting and no weakness       Home Medications Prior to Admission medications   Medication Sig Start Date End Date Taking? Authorizing Provider  blood glucose meter kit and supplies KIT Test once daily-[id number] 08/05/20   Tysinger, Camelia Eng, PA-C  carvedilol (COREG) 6.25 MG tablet Take 1 tablet (6.25 mg total) by mouth 2 (two) times daily. 02/01/21   Tysinger, Camelia Eng, PA-C  Cholecalciferol (VITAMIN D) 50 MCG (2000 UT) CAPS Take 1 capsule (2,000 Units total) by mouth daily. 02/01/21   Tysinger, Camelia Eng, PA-C  DEXILANT 60 MG capsule Take 1 capsule (60 mg total) by mouth daily. 02/01/21   Tysinger, Camelia Eng, PA-C  diclofenac Sodium (VOLTAREN) 1 % GEL APPLY 2 GRAMS TOPICALLY 4 TIMES DAILY 11/29/20   Tysinger, Camelia Eng, PA-C  ENTRESTO 49-51 MG Take 1 tablet by mouth 2 (two) times daily. 05/23/21   [provider]  EQ ASPIRIN ADULT LOW DOSE 81 MG EC tablet Take 1 tablet by mouth once daily 06/03/21   Tysinger, Camelia Eng, PA-C  etodolac (LODINE) 400 MG tablet Take 400 mg by mouth as needed. 09/16/20   [provider]  famotidine  (PEPCID) 20 MG tablet Take 20 mg by mouth 2 (two) times daily.    [provider]  fexofenadine (ALLEGRA ALLERGY) 180 MG tablet Take 1 tablet (180 mg total) by mouth daily. 07/28/21   Tysinger, Camelia Eng, PA-C  fluticasone (FLONASE) 50 MCG/ACT nasal spray Place 2 sprays into both nostrils daily. 07/28/21   Tysinger, Camelia Eng, PA-C  gabapentin (NEURONTIN) 300 MG capsule TAKE 1 CAPSULE BY MOUTH THREE TIMES DAILY 06/27/21   Tysinger, Camelia Eng, PA-C  Glucose Blood (BLOOD GLUCOSE TEST STRIPS) STRP Inject 1 each as directed daily. 09/10/20   Tysinger, Camelia Eng, PA-C  hydroxychloroquine (PLAQUENIL) 200 MG tablet TAKE 1 TABLET BY MOUTH TWICE DAILY FOR RHEUMATOID ARTHRITIS 05/23/21   Ofilia Neas, PA-C  Insulin Pen Needle (BD PEN NEEDLE NANO U/F) 32G X 4 MM MISC 1 each by Does not apply route at bedtime. 07/20/20   Tysinger, Camelia Eng, PA-C  linaclotide [facility]) 145 MCG CAPS capsule Take 1 capsule (145 mcg total) by mouth daily before breakfast. 02/01/21   Tysinger, Camelia Eng, PA-C  Misc. Devices (ROLLATOR ULTRA-LIGHT) MISC Use as directed. 02/05/19   Ofilia Neas, PA-C  ondansetron (ZOFRAN) 4 MG tablet TAKE 1 TABLET BY MOUTH EVERY 8 HOURS AS NEEDED FOR NAUSEA OR VOMITING 02/23/21   Tysinger, Camelia Eng, PA-C  potassium chloride (KLOR-CON) 10 MEQ tablet Take 1 tablet (10 mEq total) by mouth 2 (two) times daily. Patient taking differently:  Take 10 mEq by mouth daily. 07/20/20   Tysinger, Camelia Eng, PA-C  potassium chloride (KLOR-CON) 10 MEQ tablet Take 1 tablet by mouth twice daily Patient not taking: Reported on 08/16/2021 08/05/21   Tysinger, Camelia Eng, PA-C  rosuvastatin (CRESTOR) 20 MG tablet Take 1 tablet (20 mg total) by mouth daily. 02/01/21 02/01/22  Tysinger, Camelia Eng, PA-C  spironolactone (ALDACTONE) 25 MG tablet Take 25 mg by mouth daily.    [provider]  TURMERIC PO Take by mouth.    [provider]  venlafaxine XR (EFFEXOR-XR) 37.5 MG 24 hr capsule TAKE 1 CAPSULE DAILY WITH BREAKFAST. 02/01/21    Tysinger, Camelia Eng, PA-C      Allergies    Kiwi extract, Mucinex dm [dm-guaifenesin er], and Peach [prunus persica]    Review of Systems   Review of Systems  Constitutional:  Negative for chills, diaphoresis, fatigue and fever.  HENT:  Negative for congestion.   Respiratory:  Negative for cough, chest tightness, shortness of breath and wheezing.   Cardiovascular:  Positive for chest pain. Negative for palpitations and near-syncope.  Gastrointestinal:  Negative for abdominal pain, constipation, diarrhea, nausea and vomiting.  Genitourinary:  Negative for dysuria.  Musculoskeletal:  Positive for neck pain. Negative for back pain and neck stiffness.  Skin:  Negative for rash and wound.  Neurological:  Positive for headaches. Negative for dizziness, tremors, seizures, facial asymmetry, weakness, light-headedness and numbness.  Psychiatric/Behavioral:  Negative for agitation and confusion.   All other systems reviewed and are negative.  Physical Exam Updated Vital Signs BP (!) 145/97 (BP Location: Left Arm)   Pulse 70   Temp 97.6 F (36.4 C) (Oral)   Resp 15   Ht 5' 7.5" (1.715 m)   Wt 108.9 kg   LMP 02/23/2015 (LMP Unknown)   SpO2 97%   BMI 37.03 kg/m  Physical Exam Vitals and nursing note reviewed.  Constitutional:      General: She is not in acute distress.    Appearance: She is well-developed. She is not ill-appearing.  HENT:     Head: Normocephalic and atraumatic.  Eyes:     Extraocular Movements: Extraocular movements intact.     Conjunctiva/sclera: Conjunctivae normal.     Pupils: Pupils are equal, round, and reactive to light.  Cardiovascular:     Rate and Rhythm: Normal rate and regular rhythm.     Heart sounds: No murmur heard. Pulmonary:     Effort: Pulmonary effort is normal. No respiratory distress.     Breath sounds: Normal breath sounds. No decreased breath sounds, wheezing, rhonchi or rales.  Chest:     Chest wall: Tenderness present.  Abdominal:      Palpations: Abdomen is soft.     Tenderness: There is no abdominal tenderness.  Musculoskeletal:        General: No swelling.     Cervical back: Neck supple.     Right lower leg: No tenderness. No edema.     Left lower leg: No tenderness. No edema.  Skin:    General: Skin is warm and dry.     Capillary Refill: Capillary refill takes less than 2 seconds.  Neurological:     Mental Status: She is alert.  Psychiatric:        Mood and Affect: Mood normal.    ED Results / Procedures / Treatments   Labs (all labs ordered are listed, but only abnormal results are displayed) Labs Reviewed  BASIC METABOLIC PANEL - Abnormal; Notable for  the following components:      Result Value   Chloride 112 (*)    All other components within normal limits  CBC  HEPATIC FUNCTION PANEL  LIPASE, BLOOD  TROPONIN I (HIGH SENSITIVITY)  TROPONIN I (HIGH SENSITIVITY)    EKG EKG Interpretation  Date/Time:  Sunday Aug 28 2021 17:10:51 EDT Ventricular Rate:  71 PR Interval:  149 QRS Duration: 86 QT Interval:  451 QTC Calculation: 491 R Axis:   50 Text Interpretation: Sinus rhythm Borderline T wave abnormalities Borderline prolonged QT interval when compared to prior, similar appeance with more wandering baseline. No STEMI Confirmed by Antony Blackbird [phone]) on 08/28/2021 5:23:50 PM  Radiology CT Angio Head W or Wo Contrast  Result Date: 08/28/2021 CLINICAL DATA:  Headache, sudden, severe.  Neck pain. EXAM: CT ANGIOGRAPHY HEAD AND NECK TECHNIQUE: Multidetector CT imaging of the head and neck was performed using the standard protocol during bolus administration of intravenous contrast. Multiplanar CT image reconstructions and MIPs were obtained to evaluate the vascular anatomy. Carotid stenosis measurements (when applicable) are obtained utilizing NASCET criteria, using the distal internal carotid diameter as the denominator. RADIATION DOSE REDUCTION: This exam was performed according to the departmental  dose-optimization program which includes automated exposure control, adjustment of the mA and/or kV according to patient size and/or use of iterative reconstruction technique. CONTRAST:  148m OMNIPAQUE IOHEXOL 350 MG/ML SOLN COMPARISON:  None Available. FINDINGS: CT HEAD FINDINGS Brain: No evidence of acute infarction, hemorrhage, hydrocephalus, extra-axial collection or mass lesion/mass effect. Mild patchy white matter hypodensities, which are nonspecific but compatible with chronic microvascular ischemic disease. Vascular: Detailed below. Skull: No acute fracture. Sinuses: Clear visualized sinuses. Orbits: No acute orbital findings. Review of the MIP images confirms the above findings CTA NECK FINDINGS Aortic arch: Great vessel origins are patent without significant stenosis. Right carotid system: No evidence of dissection, stenosis (50% or greater) or occlusion. Fall mild atherosclerosis versus tiny carotid web at the carotid bifurcation. Left carotid system: No evidence of dissection, stenosis (50% or greater) or occlusion. Mild atherosclerosis/irregularity at the carotid bifurcation. Vertebral arteries: Left dominant. No evidence of dissection, stenosis (50% or greater) or occlusion. Skeleton: Mild multilevel degenerative change. Other neck: No acute abnormality. Upper chest: Visualized lung apices are clear. Review of the MIP images confirms the above findings CTA HEAD FINDINGS Anterior circulation: Bilateral intracranial ICAs, MCAs, and ACAs are patent without proximal hemodynamically significant stenosis. No aneurysm identified. Posterior circulation: The bilateral intradural vertebral arteries, basilar artery, and bilateral posterior cerebral arteries are patent without proximal hemodynamically significant stenosis. No aneurysm identified. Venous sinuses: No evidence of dural venous sinus thrombosis. Review of the MIP images confirms the above findings IMPRESSION: 1. No evidence of acute intracranial  abnormality. 2. No emergent vessel occlusion, proximal hemodynamically significant stenosis, or aneurysm. Electronically Signed   By: FMargaretha SheffieldM.D.   On: 08/28/2021 19:53   DG Chest 2 View  Result Date: 08/28/2021 CLINICAL DATA:  Chest pain EXAM: CHEST - 2 VIEW COMPARISON:  None Available. FINDINGS: Lungs are clear.  No pleural effusion or pneumothorax. The heart is normal in size. Degenerative changes of the thoracic spine. IMPRESSION: Normal chest radiographs. Electronically Signed   By: SJulian HyM.D.   On: 08/28/2021 17:53   CT Angio Neck W and/or Wo Contrast  Result Date: 08/28/2021 CLINICAL DATA:  Headache, sudden, severe.  Neck pain. EXAM: CT ANGIOGRAPHY HEAD AND NECK TECHNIQUE: Multidetector CT imaging of the head and neck was performed using the standard protocol  during bolus administration of intravenous contrast. Multiplanar CT image reconstructions and MIPs were obtained to evaluate the vascular anatomy. Carotid stenosis measurements (when applicable) are obtained utilizing NASCET criteria, using the distal internal carotid diameter as the denominator. RADIATION DOSE REDUCTION: This exam was performed according to the departmental dose-optimization program which includes automated exposure control, adjustment of the mA and/or kV according to patient size and/or use of iterative reconstruction technique. CONTRAST:  181m OMNIPAQUE IOHEXOL 350 MG/ML SOLN COMPARISON:  None Available. FINDINGS: CT HEAD FINDINGS Brain: No evidence of acute infarction, hemorrhage, hydrocephalus, extra-axial collection or mass lesion/mass effect. Mild patchy white matter hypodensities, which are nonspecific but compatible with chronic microvascular ischemic disease. Vascular: Detailed below. Skull: No acute fracture. Sinuses: Clear visualized sinuses. Orbits: No acute orbital findings. Review of the MIP images confirms the above findings CTA NECK FINDINGS Aortic arch: Great vessel origins are patent  without significant stenosis. Right carotid system: No evidence of dissection, stenosis (50% or greater) or occlusion. Fall mild atherosclerosis versus tiny carotid web at the carotid bifurcation. Left carotid system: No evidence of dissection, stenosis (50% or greater) or occlusion. Mild atherosclerosis/irregularity at the carotid bifurcation. Vertebral arteries: Left dominant. No evidence of dissection, stenosis (50% or greater) or occlusion. Skeleton: Mild multilevel degenerative change. Other neck: No acute abnormality. Upper chest: Visualized lung apices are clear. Review of the MIP images confirms the above findings CTA HEAD FINDINGS Anterior circulation: Bilateral intracranial ICAs, MCAs, and ACAs are patent without proximal hemodynamically significant stenosis. No aneurysm identified. Posterior circulation: The bilateral intradural vertebral arteries, basilar artery, and bilateral posterior cerebral arteries are patent without proximal hemodynamically significant stenosis. No aneurysm identified. Venous sinuses: No evidence of dural venous sinus thrombosis. Review of the MIP images confirms the above findings IMPRESSION: 1. No evidence of acute intracranial abnormality. 2. No emergent vessel occlusion, proximal hemodynamically significant stenosis, or aneurysm. Electronically Signed   By: FMargaretha SheffieldM.D.   On: 08/28/2021 19:53   CT Angio Chest PE W and/or Wo Contrast  Result Date: 08/28/2021 CLINICAL DATA:  Pleuritic right chest pain, right head and neck pain, midsternal chest pain radiating into right arm EXAM: CT ANGIOGRAPHY CHEST WITH CONTRAST TECHNIQUE: Multidetector CT imaging of the chest was performed using the standard protocol during bolus administration of intravenous contrast. Multiplanar CT image reconstructions and MIPs were obtained to evaluate the vascular anatomy. RADIATION DOSE REDUCTION: This exam was performed according to the departmental dose-optimization program which  includes automated exposure control, adjustment of the mA and/or kV according to patient size and/or use of iterative reconstruction technique. CONTRAST:  1544mOMNIPAQUE IOHEXOL 350 MG/ML SOLN COMPARISON:  08/04/2013 FINDINGS: Cardiovascular: This is a technically adequate evaluation of the pulmonary vasculature. No filling defects or pulmonary emboli. Mild cardiomegaly with prominent biventricular dilation. No pericardial effusion. No evidence of thoracic aortic aneurysm or dissection. Mediastinum/Nodes: No enlarged mediastinal, hilar, or axillary lymph nodes. Thyroid gland, trachea, and esophagus demonstrate no significant findings. Lungs/Pleura: No acute airspace disease, effusion, or pneumothorax. Dependent hypoventilatory changes. Calcified granuloma left lower lobe. Central airways are patent. Upper Abdomen: No acute abnormality. Musculoskeletal: No acute or destructive bony lesions. Reconstructed images demonstrate no additional findings. Review of the MIP images confirms the above findings. IMPRESSION: 1. No evidence of pulmonary embolus. 2. Mild cardiomegaly. 3. No acute intrathoracic process. Electronically Signed   By: MiRanda Ngo.D.   On: 08/28/2021 19:43    Procedures Procedures    Medications Ordered in ED Medications  sodium chloride (PF) 0.9 %  injection (has no administration in time range)  sodium chloride (PF) 0.9 % injection (has no administration in time range)  fentaNYL (SUBLIMAZE) injection 50 mcg (50 mcg Intravenous Given 08/28/21 1818)  iohexol (OMNIPAQUE) 350 MG/ML injection 150 mL (150 mLs Intravenous Contrast Given 08/28/21 1911)    ED Course/ Medical Decision Making/ A&P                           Medical Decision Making Amount and/or Complexity of Data Reviewed Labs: ordered. Radiology: ordered.  Risk Prescription drug management.    JAYNA MULNIX is a 61 y.o. 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  According to patient, for the last few weeks she has been having pain in her right neck going into her right head.  She reports it hurts like that after a trauma years ago but has been hurting worsened recently.  She also says that this morning she had onset of pain in her right chest.  Goes into her right arm.  She reports it is pleuritic at times and it is sharp.  She denies any nausea, vomiting, constipation, diarrhea, or urinary changes.  Denies any rashes.  Denies any focal neurologic changes including no speech difficulties, vision changes, or numbness, tingling, or weakness of extremities.  She reports the discomfort gets up to 10 out of 10 but is now more moderate.  She denies fevers, chills, Jassen, or cough.  On exam, lungs clear.  Right chest is tender to palpation I do not appreciate a rash to suggest shingles.  She did not have a carotid bruit on my exam but did have some tenderness in her right neck.  No tenderness in her shoulder or arms and had intact sensation, strength, and pulses.  Patient reported some bruising last week on her right calf that has resolved.  It is not tender at this time.  She denies history of DVT or PE.  Given the patient's history of cancer, the pleuritic chest discomfort, I do feel need to rule out a pulm embolism.  With her pain in her right neck going into her head that seems somewhat atypical, I do also feel need to rule out a vertebral artery dissection.  We will get screening labs including troponins and get the imaging.  We will give her some pain medicine.  She will get worked up for possible life-threatening condition.  If work-up is reassuring, anticipate soft tissue musculoskeletal pains given the external tenderness and she will be able to follow-up with the PCP.   10:28 PM Patient's  work-up returned overall reassuring.  CT PE study was negative and imaging of the head and neck did not show concerning signs including no dissection or aneurysms.  Labs also reassuring.  I personally viewed the images and labs and interpreted them.  On reassessment she is feeling better.  I suspect patient has chronic pain from her previous accident that is now causing some spasm in her right chest.  Given her reassuring vital signs for over 5 hours I do feel she is safe for discharge home and she agrees.  Will give prescription for muscle relaxant and Lidoderm patches and have her follow-up with her PCP.  She understood return precautions and follow-up instructions had no questions or concerns.  Patient discharged in good condition.          Final Clinical Impression(s) / ED Diagnoses Final diagnoses:  Muscle spasm  Chest wall pain  Atypical chest pain  Acute nonintractable headache, unspecified headache type    Rx / DC Orders ED Discharge Orders          Ordered    lidocaine (LIDODERM) 5 %  Every 24 hours        08/28/21 2231    cyclobenzaprine (FLEXERIL) 10 MG tablet  2 times daily PRN        08/28/21 2231            Clinical Impression: 1. Muscle spasm   2. Chest wall pain   3. Atypical chest pain   4. Acute nonintractable headache, unspecified headache type     Disposition: Discharge  Condition: Good  I have discussed the results, Dx and Tx plan with the pt(& family if present). He/she/they expressed understanding and agree(s) with the plan. Discharge instructions discussed at great length. Strict return precautions discussed and pt &/or family have verbalized understanding of the instructions. No further questions at time of discharge.    New Prescriptions   CYCLOBENZAPRINE (FLEXERIL) 10 MG TABLET    Take 1 tablet (10 mg total) by mouth 2 (two) times daily as needed for muscle spasms.   LIDOCAINE (LIDODERM) 5 %    Place 1 patch onto the skin daily. Remove &  Discard patch within 12 hours or as directed by MD    Follow Up: Carlena Hurl, PA-C [address] [place] [address] [place] DEPT [address] [id number] [place]       Tegeler, Gwenyth Allegra, MD 08/28/21 [phone]

## 2021-08-28 NOTE — ED Notes (Addendum)
Pt updated on treatment plan. Pt ambulated to the bathroom in no distress or pain.

## 2021-08-28 NOTE — Discharge Instructions (Signed)
Your history, exam, work-up today was overall reassuring.  I suspect your symptoms are related to chest wall discomfort and musculoskeletal spasm and pain based on your exam with tenderness.  Your CT imaging did not show evidence of pneumonia, blood clots, or other acute abnormality with the vessels or bones in the head and neck.  Your labs were also reassuring and you had stability with your vital signs for over 5 hours.  With the improved symptoms, we feel you are safe for discharge home.  Please follow-up with your primary doctor and consider using the muscle relaxant and Lidoderm patches to help with the discomfort in the chest wall.  If any symptoms change or worsen acutely, please return to the nearest emergency department.

## 2021-08-28 NOTE — ED Notes (Signed)
Pt eating cheese and crackers, tolerating well.

## 2021-08-28 NOTE — ED Notes (Signed)
Pt discharged. Instructions and prescriptions given. AAOX4. Pt in no apparent distress or pain. The opportunity to ask questions was provided.  

## 2021-08-30 ENCOUNTER — Telehealth: Payer: Self-pay | Admitting: Family Medicine

## 2021-08-30 ENCOUNTER — Ambulatory Visit: Payer: Medicare HMO | Admitting: Orthopaedic Surgery

## 2021-08-30 NOTE — Telephone Encounter (Signed)
Called pt reached voice mail [email or web address].

## 2021-08-30 NOTE — Telephone Encounter (Signed)
Pt left message on voice mail to call her regarding dismissal.

## 2021-09-05 ENCOUNTER — Other Ambulatory Visit: Payer: Self-pay | Admitting: Medical

## 2021-09-07 ENCOUNTER — Other Ambulatory Visit: Payer: Self-pay | Admitting: Physician Assistant

## 2021-09-07 ENCOUNTER — Other Ambulatory Visit: Payer: Self-pay | Admitting: Medical

## 2021-09-07 DIAGNOSIS — M0579 Rheumatoid arthritis with rheumatoid factor of multiple sites without organ or systems involvement: Secondary | ICD-10-CM

## 2021-09-07 NOTE — Telephone Encounter (Signed)
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   Okay to refill Plaquenil?

## 2021-09-08 ENCOUNTER — Telehealth: Payer: Self-pay | Admitting: Internal Medicine

## 2021-09-08 ENCOUNTER — Telehealth: Payer: Self-pay | Admitting: Family Medicine

## 2021-09-08 ENCOUNTER — Other Ambulatory Visit: Payer: Self-pay | Admitting: Medical

## 2021-09-08 NOTE — Telephone Encounter (Signed)
Pt called and wants 90 day refill on all her med that are due and she needs you to correct her Flonase usage to twice daily.  She said that is how you wanted her to take it.  [employer].  Pt recently had med check.

## 2021-09-08 NOTE — Telephone Encounter (Signed)
Pt called and has been dismissed but she wants to know if we can prescribe flonase for twice a day and send in effexor for 90 days as she hasn't found another provider yet. Please advise

## 2021-09-09 ENCOUNTER — Telehealth: Payer: Self-pay

## 2021-09-09 ENCOUNTER — Other Ambulatory Visit: Payer: Self-pay | Admitting: Medical

## 2021-09-09 MED ORDER — VENLAFAXINE HCL ER 37.5 MG PO CP24
ORAL_CAPSULE | ORAL | 0 refills | Status: AC
Start: 2021-09-09 — End: ?

## 2021-09-09 MED ORDER — FLUTICASONE PROPIONATE 50 MCG/ACT NA SUSP: 2.0000 | Freq: Two times a day (BID) | NASAL | 2 refills | Status: AC

## 2021-09-09 NOTE — Telephone Encounter (Signed)
Lisa Briggs did this

## 2021-09-09 NOTE — Telephone Encounter (Signed)
P.A. FLUTICASONE completed

## 2021-09-15 ENCOUNTER — Ambulatory Visit (INDEPENDENT_AMBULATORY_CARE_PROVIDER_SITE_OTHER): Payer: Medicare HMO | Admitting: Orthopaedic Surgery

## 2021-09-15 ENCOUNTER — Ambulatory Visit: Payer: Self-pay

## 2021-09-15 ENCOUNTER — Ambulatory Visit (INDEPENDENT_AMBULATORY_CARE_PROVIDER_SITE_OTHER): Payer: Medicare HMO

## 2021-09-15 ENCOUNTER — Encounter: Payer: Self-pay | Admitting: Orthopaedic Surgery

## 2021-09-15 DIAGNOSIS — M25572 Pain in left ankle and joints of left foot: Secondary | ICD-10-CM

## 2021-09-15 DIAGNOSIS — G8929 Other chronic pain: Secondary | ICD-10-CM | POA: Diagnosis not present

## 2021-09-15 DIAGNOSIS — M25571 Pain in right ankle and joints of right foot: Secondary | ICD-10-CM | POA: Insufficient documentation

## 2021-09-15 NOTE — Progress Notes (Signed)
Office Visit Note   [name]           Date of Birth: 1960-11-10           MRN: [id number] Visit Date: 09/15/2021              Requested by: Carlena Hurl, PA-C [address] [place],  [place] [id number] PCP: Carlena Hurl, PA-C   Assessment & Plan: Visit Diagnoses:  1. Chronic pain of both ankles     Plan: Ms. Soza relates a chronic history of bilateral ankle pain.  She does have a history of rheumatoid arthritis followed by Dr Estanislado Pandy.  She presently is on Plaquenil.  She does use a cane that she has multiple joint problems.  Prior history of at least 1 or 2 left ankle sprains.  She does note on occasion she has some ankle swelling.  She does have bilateral pes planus and some pain along the lateral ankles bilaterally and may be even some mild peroneal tendinopathy.  I am going to place her in bilateral arch supports and a left ankle support and monitor response.  At some point she may be a candidate for an MRI scan but hopefully the above will be helpful  Follow-Up Instructions: No follow-ups on file.   Orders:  Orders Placed This Encounter  Procedures   XR Ankle Complete Right   XR Ankle Complete Left   No orders of the defined types were placed in this encounter.     Procedures: No procedures performed   Clinical Data: No additional findings.   Subjective: Chief Complaint  Patient presents with   Right Ankle - Follow-up   Left Ankle - Follow-up   Patient presents today with a new problem with her left and right ankle. She states that she has been having pain behind her left and right ankle that has been radiating into the front of her ankle. Pain was noticed around 2019 when she had a fall however has gradually been getting worse in the left ankle.  At this time she is currently ambulating with a cane and has been taking OTC tylenol for pain which has not been helping. Patient states that she has not had any previous surgeries on her left  and right ankle before.  Has a history of rheumatoid arthritis and presently on Plaquenil  Review of Systems   Objective: Vital Signs: LMP 02/23/2015 (LMP Unknown)   Physical Exam Constitutional:      Appearance: She is well-developed.  Eyes:     Pupils: Pupils are equal, round, and reactive to light.  Pulmonary:     Effort: Pulmonary effort is normal.  Skin:    General: Skin is warm and dry.  Neurological:     Mental Status: She is alert and oriented to person, place, and time.  Psychiatric:        Behavior: Behavior normal.     Ortho Exam awake alert and oriented x3.  Comfortable sitting.  Left ankle with some tenderness over the anterior talofibular ligament.  I do not think she has increased talar tilt and negative anterior drawer sign.  Very little discomfort medially.  Has pes planus.  No forefoot discomfort.  Skin is nice and warm with good pulses.  Normal sensation.  No Achilles discomfort.  Achilles intact.  No pain in the midfoot  Right ankle with pes planus but no plantar pain.  No posterior discomfort.  There is a little prominence of the pump  pump on the left but not the right and neither is symptomatic.  No increased talar tilt.  Some very minimal discomfort along the peroneal tendons but no swelling.  Tendon function intact bilaterally.  Skin intact.  Neurologically intact  Specialty Comments:  No specialty comments available.  Imaging: No results found.   PMFS History: Patient Active Problem List   Diagnosis Date Noted   Chronic HFrEF (heart failure with reduced ejection fraction) ([facility]) 07/29/2021   Advance directive discussed with patient 01/27/2021   Medicare annual wellness visit, subsequent 01/27/2021   Needs flu shot 01/27/2021   At moderate risk for fall 01/27/2021   Prediabetes 02/19/2020   Menopause 11/25/2019   Cardiomyopathy ([facility]) 11/19/2019   Rheumatoid arthritis involving multiple sites with positive rheumatoid factor ([facility]) 11/19/2019    Primary osteoarthritis involving multiple joints 11/19/2019   Greater trochanteric bursitis, right 11/19/2019   Decreased activities of daily living (ADL) 11/19/2019   Gait disturbance 11/19/2019   Use of cane as ambulatory aid 11/19/2019   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    Class: Chronic   Spinal stenosis of lumbar region 04/25/2019    Class: Chronic   Status post lumbar laminectomy 04/25/2019   Chronic pain of right knee 09/13/2018   Chronic pain of left knee 07/08/2018   Chronic hip pain 07/08/2018   Edema 07/08/2018   Need for shingles vaccine 12/05/2017   Primary osteoarthritis of both hands 11/23/2017   Primary osteoarthritis of both knees 11/23/2017   Primary osteoarthritis of both feet 11/23/2017   DDD (degenerative disc disease), lumbar 11/23/2017   Vaccine counseling 08/13/2017   Sensitive skin 01/23/2017   Need for influenza vaccination 01/23/2017   History of gastrointestinal stromal tumor (GIST) 04/20/2016   History of TIA (transient ischemic attack) 04/20/2016   Screening for breast cancer 04/20/2016   Estrogen deficiency 04/20/2016   Impaired fasting blood sugar 04/20/2016   Constipation 04/20/2016   History of fall 04/20/2016   Chronic radicular lumbar pain 01/27/2016   Encounter for health maintenance examination in adult 03/22/2015   Paresthesia 03/22/2015   Cognitive decline 03/22/2015   Screening for cervical cancer 03/22/2015   Vitamin D deficiency 03/22/2015   History of uterine leiomyoma 03/22/2015   Gastroesophageal reflux disease without esophagitis 03/02/2014   Chronic nausea 03/02/2014   Rhinitis, allergic 03/02/2014   Essential hypertension 03/02/2014   Hyperlipidemia 03/02/2014   Obesity with serious comorbidity 01/16/2012   Past Medical History:  Diagnosis Date   Allergy    Anemia    iron therapy for years as of 10/12; normal Hgb 08/2013   Anxiety    Arthritis    Cardiomyopathy ([facility])    Chest pain 04/05/2011    cardiac eval, normal treadmill stress test, Dr. Tollie Eth   Chronic back pain    Constipation    Farsightedness    wears glasses, [facility] care center   Gastrointestinal stromal tumor (GIST) ([facility]) 06/2014   Dr. [name], [facility]   GERD (gastroesophageal reflux disease)    History of uterine fibroid    Hyperlipidemia    Hypertension    Paresthesia 09/2014   initially thought to be TIA, neurology consult in 12/2014 with other non TIA considerations.     Polyarthralgia    normal rheumatoid screen 01/2012   TIA (transient ischemic attack) 2015/16    Family History  Problem Relation Age of Onset   Hypertension Mother    Arthritis Mother    GER disease Mother  Glaucoma Mother    Breast cancer Mother 32   Stroke Father    Breast cancer Sister 74   Hypertension Sister    Leukemia Sister    Hypertension Sister    Colon cancer Brother 75   Diabetes Brother    Diabetes Maternal Aunt    Heart disease Maternal Aunt    Heart disease Maternal Uncle    Stroke Maternal Uncle    Heart disease Maternal Grandmother    Heart disease Maternal Grandfather    Healthy Son    Colon polyps Neg Hx    Esophageal cancer Neg Hx    Stomach cancer Neg Hx    Rectal cancer Neg Hx     Past Surgical History:  Procedure Laterality Date   COLONOSCOPY  01/2014   diverticulosis, othwerise normal - Dr. Owens Loffler   DILATATION & CURETTAGE/HYSTEROSCOPY WITH MYOSURE N/A 07/14/2021   Procedure: DILATATION & CURETTAGE/HYSTEROSCOPY WITH MYOSURE Reach;  Surgeon: Azucena Fallen, MD;  Location: [facility];  Service: Gynecology;  Laterality: N/A;   ESOPHAGOGASTRODUODENOSCOPY  2013   Dr. Benson Norway, gastritis   ESOPHAGOGASTRODUODENOSCOPY  [id number]   EUS N/A 04/16/2014   Procedure: UPPER ENDOSCOPIC ULTRASOUND (EUS) LINEAR;  Surgeon: Milus Banister, MD;  Location: [facility];  Service: Endoscopy;  Laterality: N/A;   gall stone surgery     KNEE ARTHROSCOPY Left    LAPAROSCOPIC  GASTRIC RESECTION N/A 06/09/2014   Procedure: LAPAROSCOPIC GASTRIC MASS RESECTION;  Surgeon: [name], MD;  Location: [facility];  Service: General;  Laterality: N/A;   LIPOMA EXCISION     forehead   LUMBAR LAMINECTOMY N/A 04/25/2019   Procedure: LEFT L2-3 MICRODISCECTOMY, BILATERAL L5-S1 PARTIAL HEMILAMINECTOMY;  Surgeon: Jessy Oto, MD;  Location: [facility];  Service: Orthopedics;  Laterality: N/A;   UPPER GASTROINTESTINAL ENDOSCOPY     UTERINE FIBROID SURGERY     Social History   Occupational History   Occupation: IT sales professional: [employer]  Tobacco Use   Smoking status: Never    Passive exposure: Current   Smokeless tobacco: Never  Vaping Use   Vaping Use: Never used  Substance and Sexual Activity   Alcohol use: No    Alcohol/week: 0.0 standard drinks of alcohol   Drug use: No   Sexual activity: Not on file

## 2021-09-30 NOTE — Telephone Encounter (Signed)
P.A. approved til 04/02/22 for quantity limits, faxed pharmacy

## 2021-10-20 ENCOUNTER — Other Ambulatory Visit: Payer: Self-pay | Admitting: Medical

## 2021-10-20 ENCOUNTER — Other Ambulatory Visit: Payer: Self-pay | Admitting: Physician Assistant

## 2021-10-20 DIAGNOSIS — M0579 Rheumatoid arthritis with rheumatoid factor of multiple sites without organ or systems involvement: Secondary | ICD-10-CM

## 2021-10-20 NOTE — Telephone Encounter (Signed)
Next Visit: 11/03/2021   Last Visit: 08/16/2021   Labs: 08/28/2021 Chloride 112   Eye exam: 10/18/2021 WNL    Current Dose per office note 08/16/2021: Plaquenil 200 mg 1 tablet by mouth twice daily   [id number] arthritis involving multiple sites with positive rheumatoid factor    Last Fill: 09/07/2021 (30 day supply)    Okay to refill Plaquenil?

## 2021-11-03 ENCOUNTER — Ambulatory Visit: Payer: Medicare HMO | Attending: Rheumatology | Admitting: Rheumatology

## 2021-11-03 ENCOUNTER — Ambulatory Visit (INDEPENDENT_AMBULATORY_CARE_PROVIDER_SITE_OTHER): Payer: Medicare HMO

## 2021-11-03 DIAGNOSIS — M79642 Pain in left hand: Secondary | ICD-10-CM | POA: Diagnosis not present

## 2021-11-03 DIAGNOSIS — M79641 Pain in right hand: Secondary | ICD-10-CM

## 2021-11-03 DIAGNOSIS — M0579 Rheumatoid arthritis with rheumatoid factor of multiple sites without organ or systems involvement: Secondary | ICD-10-CM

## 2021-11-03 NOTE — Progress Notes (Signed)
Patient came today to get ultrasound of bilateral hands due to ongoing pain and discomfort in her hands.  She had no synovitis on the examination.  Ultrasound examination of bilateral hands was performed per EULAR recommendations. Using 15 MHz transducer, grayscale and power Doppler bilateral second, third, and fifth MCP joints and bilateral wrist joints both dorsal and volar aspects were evaluated to look for synovitis or tenosynovitis. The findings were there was no synovitis or tenosynovitis on ultrasound examination. Right median nerve was 0.13 cm squares which was greater than upper limits of normal and left median nerve was 0.09 cm squares which was within normal limits.  Impression: Ultrasound examination did not show any synovitis or tenosynovitis.  Right median nerve was enlarged.  Ultrasound findings were discussed with the patient.  Patient continues to have pain and discomfort in her hands.  She also has paresthesias in her right hand.  She states that she was diagnosed with carpal tunnel syndrome in the past.  She is not interested in getting a cortisone injection.  Use of carpal tunnel brace was advised. She does not want a referral to hand surgeon for carpal tunnel release at this point.  I will refer her to physical therapy for osteoarthritis and carpal tunnel syndrome.  Bo Merino, MD

## 2021-11-13 ENCOUNTER — Other Ambulatory Visit: Payer: Self-pay | Admitting: Medical

## 2021-12-18 ENCOUNTER — Other Ambulatory Visit: Payer: Self-pay | Admitting: Medical

## 2021-12-19 ENCOUNTER — Other Ambulatory Visit: Payer: Self-pay | Admitting: Medical

## 2022-01-02 ENCOUNTER — Ambulatory Visit: Payer: Medicare HMO | Admitting: Cardiology

## 2022-01-02 ENCOUNTER — Encounter: Payer: Self-pay | Admitting: Cardiology

## 2022-01-02 VITALS — BP 128/82 | HR 75 | Temp 97.6°F | Resp 16 | Ht 67.0 in | Wt 249.0 lb

## 2022-01-02 DIAGNOSIS — E782 Mixed hyperlipidemia: Secondary | ICD-10-CM

## 2022-01-02 DIAGNOSIS — I5022 Chronic systolic (congestive) heart failure: Secondary | ICD-10-CM

## 2022-01-02 DIAGNOSIS — I429 Cardiomyopathy, unspecified: Secondary | ICD-10-CM

## 2022-01-02 DIAGNOSIS — Z8673 Personal history of transient ischemic attack (TIA), and cerebral infarction without residual deficits: Secondary | ICD-10-CM

## 2022-01-02 DIAGNOSIS — I10 Essential (primary) hypertension: Secondary | ICD-10-CM

## 2022-01-02 MED ORDER — DAPAGLIFLOZIN PROPANEDIOL 10 MG PO TABS: 10.0000 mg | ORAL_TABLET | Freq: Every day | ORAL | 0 refills | Status: DC

## 2022-01-02 NOTE — Progress Notes (Signed)
Lisa Briggs Date of Birth: [birth year] MRN: [id number] Primary Care Provider:Shah, Talbert Forest, MD Former Cardiology Providers: Jeri Lager, APRN, FNP-C Primary Cardiologist: Rex Kras, DO, [facility] (established care 07/02/2019)  Date: 01/02/22 Last Office Visit: 07/01/2021  Chief Complaint  Patient presents with   heart failure management   Follow-up   HPI  Lisa Briggs is a 61 y.o.  female whose past medical history and cardiovascular risk factors include: Seropositive rheumatoid arthritis (Lisa Briggs), osteoarthritis, DDD, benign essential hypertension, transient ischemic attack, mildly reduced left ventricular systolic function consistent with cardiomyopathy, obesity.  Patient is being followed by the practice given her mildly reduced LVEF and multiple cardiovascular risk factors.  She is here for 59-monthfollow-up visit.  She denies anginal discomfort or heart failure symptoms.  Her last office visit she developed right knee pain likely due to her underlying rheumatological condition and has an appointment to see her provider on 01/18/2022.  ALLERGIES: Allergies  Allergen Reactions   Kiwi Extract Hives and Itching   Mucinex Dm [Dm-Guaifenesin Er] Nausea Only    Messes up her stomach. Pt report on 01/21/15   Peach [Prunus Persica] Itching    Peach peeling makes pt itch     MEDICATION LIST PRIOR TO VISIT: Current Outpatient Medications on File Prior to Visit  Medication Sig Dispense Refill   blood glucose meter kit and supplies KIT Test once daily-DX-E11.69 1 each 0   carvedilol (COREG) 6.25 MG tablet Take 1 tablet (6.25 mg total) by mouth 2 (two) times daily. 180 tablet 3   cyclobenzaprine (FLEXERIL) 10 MG tablet Take 1 tablet (10 mg total) by mouth 2 (two) times daily as needed for muscle spasms. 20 tablet 0   diclofenac Sodium (VOLTAREN) 1 % GEL APPLY 2 GRAMS TOPICALLY 4 TIMES DAILY 100 g 0   docusate sodium (COLACE) 100 MG capsule Take 100 mg by mouth daily as  needed for mild constipation.     ENTRESTO 49-51 MG Take 1 tablet by mouth 2 (two) times daily.     EQ ASPIRIN ADULT LOW DOSE 81 MG EC tablet Take 1 tablet by mouth once daily 90 tablet 0   etodolac (LODINE) 400 MG tablet Take 400 mg by mouth 2 (two) times daily.     famotidine (PEPCID) 20 MG tablet Take 20 mg by mouth daily.     Fish Oil-Cholecalciferol (OMEGA-3 & VITAMIN D3 GUMMIES PO) Take 2,000 mg by mouth daily at 12 noon.     fluticasone (FLONASE) 50 MCG/ACT nasal spray Place 2 sprays into both nostrils in the morning and at bedtime. 16 mL 2   gabapentin (NEURONTIN) 300 MG capsule TAKE 1 CAPSULE BY MOUTH THREE TIMES DAILY 90 capsule 5   Glucose Blood (BLOOD GLUCOSE TEST STRIPS) STRP Inject 1 each as directed daily. 100 strip 5   hydroxychloroquine (PLAQUENIL) 200 MG tablet TAKE 1 TABLET BY MOUTH TWICE DAILY FOR RHEUMATOID ARTHRITIS 60 tablet 2   Insulin Pen Needle (BD PEN NEEDLE NANO U/F) 32G X 4 MM MISC 1 each by Does not apply route at bedtime. 100 each 11   lidocaine (LIDODERM) 5 % Place 1 patch onto the skin daily. Remove & Discard patch within 12 hours or as directed by MD 15 patch 0   linaclotide (LINZESS) 145 MCG CAPS capsule Take 1 capsule (145 mcg total) by mouth daily before breakfast. 90 capsule 1   Misc. Devices (ROLLATOR ULTRA-LIGHT) MISC Use as directed. 1 each 0   ondansetron (ZOFRAN) 4 MG tablet  TAKE 1 TABLET BY MOUTH EVERY 8 HOURS AS NEEDED FOR NAUSEA OR VOMITING 90 tablet 0   potassium chloride (KLOR-CON) 10 MEQ tablet Take 1 tablet by mouth twice daily 180 tablet 0   pyridOXINE (VITAMIN B6) 100 MG tablet Take 100 mg by mouth daily.     RESTASIS 0.05 % ophthalmic emulsion 1 drop 2 (two) times daily.     rosuvastatin (CRESTOR) 20 MG tablet Take 1 tablet (20 mg total) by mouth daily. 90 tablet 3   spironolactone (ALDACTONE) 25 MG tablet Take 25 mg by mouth daily.     TURMERIC PO Take 500 mg by mouth.     venlafaxine XR (EFFEXOR-XR) 37.5 MG 24 hr capsule TAKE 1 CAPSULE DAILY  WITH BREAKFAST. 90 capsule 0   No current facility-administered medications on file prior to visit.    PAST MEDICAL HISTORY: Past Medical History:  Diagnosis Date   Allergy    Anemia    iron therapy for years as of 10/12; normal Hgb 08/2013   Anxiety    Arthritis    Cardiomyopathy ([place])    Chest pain 04/05/2011   cardiac eval, normal treadmill stress test, Dr. 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 [name], [facility]   GERD (gastroesophageal reflux disease)    History of uterine fibroid    Hyperlipidemia    Hypertension    Paresthesia 09/2014   initially thought to be TIA, neurology consult in 12/2014 with other non TIA considerations.     Polyarthralgia    normal rheumatoid screen 01/2012   TIA (transient ischemic attack) 2015/16    PAST SURGICAL HISTORY: Past Surgical History:  Procedure Laterality Date   COLONOSCOPY  01/2014   diverticulosis, othwerise normal - Dr. Owens Loffler   DILATATION & CURETTAGE/HYSTEROSCOPY WITH MYOSURE N/A 07/14/2021   Procedure: DILATATION & CURETTAGE/HYSTEROSCOPY WITH MYOSURE Re[place];  Surgeon: Azucena Fallen, MD;  Location: [facility];  Service: Gynecology;  Laterality: N/A;   ESOPHAGOGASTRODUODENOSCOPY  2013   Dr. Benson Norway, gastritis   ESOPHAGOGASTRODUODENOSCOPY  [id number]   EUS N/A 04/16/2014   Procedure: UPPER ENDOSCOPIC ULTRASOUND (EUS) LINEAR;  Surgeon: Milus Banister, MD;  Location: [facility];  Service: Endoscopy;  Laterality: N/A;   gall stone surgery     KNEE ARTHROSCOPY Left    LAPAROSCOPIC GASTRIC RESECTION N/A 06/09/2014   Procedure: LAPAROSCOPIC GASTRIC MASS RESECTION;  Surgeon: [name], MD;  Location: [facility];  Service: General;  Laterality: N/A;   LIPOMA EXCISION     forehead   LUMBAR LAMINECTOMY N/A 04/25/2019   Procedure: LEFT L2-3 MICRODISCECTOMY, BILATERAL  L5-S1 PARTIAL HEMILAMINECTOMY;  Surgeon: Jessy Oto, MD;  Location: [facility];  Service: Orthopedics;  Laterality: N/A;   UPPER GASTROINTESTINAL ENDOSCOPY     UTERINE FIBROID SURGERY      FAMILY HISTORY: The patient's family history includes Arthritis in her mother; Breast cancer (age of onset: 80) in her sister; Breast cancer (age of onset: 10) in her mother; Colon cancer (age of onset: 73) in her brother; Diabetes in her brother and maternal aunt; GER disease in her mother; Glaucoma in her mother; Healthy in her son; Heart disease in her maternal aunt, maternal grandfather, maternal grandmother, and maternal uncle; Hypertension in her mother, sister, and sister; Leukemia in her sister; Stroke in her father and maternal uncle.   SOCIAL  HISTORY:  The patient  reports that she has never smoked. She has been exposed to tobacco smoke. She has never used smokeless tobacco. She reports that she does not drink alcohol and does not use drugs.  Review of Systems  Cardiovascular:  Negative for chest pain, dyspnea on exertion, leg swelling, orthopnea, palpitations, paroxysmal nocturnal dyspnea and syncope.  Respiratory:  Negative for shortness of breath.   Musculoskeletal:  Positive for joint pain (right knee).    PHYSICAL EXAM:    01/02/2022   11:24 AM 08/28/2021   10:00 PM 08/28/2021    9:00 PM  Vitals with BMI  Height 5' 7"     Weight 249 lbs    BMI 12.19    Systolic 758 832 549  Diastolic 82 76 826  Pulse 75 75 82   CONSTITUTIONAL: Age-appropriate  female, obese, 4 wheel walker, no acute distress, hemodynamically stable.  SKIN: Skin is warm and dry. No rash noted. No cyanosis. No pallor. No jaundice HEAD: Normocephalic and atraumatic.  EYES: No scleral icterus MOUTH/THROAT: Moist oral membranes.  NECK: No JVD present. No thyromegaly noted. No carotid bruits  CHEST Normal respiratory effort. No intercostal retractions  LUNGS: Clear to auscultation bilaterally.  No stridor. No wheezes. No  rales.  CARDIOVASCULAR: Regular rate and rhythm, positive S1-S2, no murmurs rubs or gallops appreciated.  ABDOMINAL: Obese, soft, nontender, nondistended, positive bowel sounds all 4 quadrants.No apparent ascites.  EXTREMITIES: No peripheral edema.  Warm to touch. HEMATOLOGIC: No significant bruising NEUROLOGIC: Oriented to person, place, and time. Nonfocal. Normal muscle tone.  PSYCHIATRIC: Normal mood and affect. Normal behavior. Cooperative  CARDIAC DATABASE: EKG: 01/02/2022: Sinus rhythm, 78 bpm, without underlying ischemia injury pattern.  Echocardiogram: 04/17/2019: LVEF 41-58%, grade 2 diastolic dysfunction, elevated left atrial pressure, mild TR, no pulmonary hypertension.  06/05/2019: LVEF 45-50%, normal wall thickness, mild global hypokinesis, grade 1 diastolic impairment, normal left atrial pressure, mild MR, mild TR, mild TR, no pulmonary hypertension.  Stress Testing:  Lexiscan  Sestamibi Stress Test 07/09/2019: Myocardial perfusion is normal. Stress LV EF: 51%.  No previous exam available for comparison. Low risk study.   Heart Catheterization: None  LABORATORY DATA:    Latest Ref Rng & Units 08/28/2021    5:27 PM 07/11/2021   10:14 AM 06/23/2021   10:21 AM  CBC  WBC 4.0 - 10.5 K/uL 6.8  6.2  6.8   Hemoglobin 12.0 - 15.0 g/dL 12.5  12.3  12.3   Hematocrit 36.0 - 46.0 % 39.4  41.6  39.1   Platelets 150 - 400 K/uL 279  288  291        Latest Ref Rng & Units 08/28/2021    5:27 PM 07/11/2021   10:14 AM 06/23/2021   10:21 AM  CMP  Glucose 70 - 99 mg/dL 85  85  89   BUN 6 - 20 mg/dL 20  14  14    Creatinine 0.44 - 1.00 mg/dL 0.83  0.68  0.80   Sodium 135 - 145 mmol/L 142  143  144   Potassium 3.5 - 5.1 mmol/L 3.8  4.4  4.3   Chloride 98 - 111 mmol/L 112  110  107   CO2 22 - 32 mmol/L 23  30  25    Calcium 8.9 - 10.3 mg/dL 9.4  8.9  9.7   Total Protein 6.5 - 8.1 g/dL 7.2     Total Bilirubin 0.3 - 1.2 mg/dL 0.6     Alkaline Phos 38 - 126 U/L 103  AST 15 - 41 U/L  17     ALT 0 - 44 U/L 15       Lipid Panel  Lab Results  Component Value Date   CHOL 148 01/27/2021   HDL 65 01/27/2021   LDLCALC 71 01/27/2021   TRIG 58 01/27/2021   CHOLHDL 2.3 01/27/2021    Lab Results  Component Value Date   HGBA1C 5.8 (A) 06/23/2021   HGBA1C 5.7 (H) 01/27/2021   HGBA1C 6.0 (H) 11/19/2019   No components found for: "NTPROBNP" Lab Results  Component Value Date   TSH 1.700 11/19/2019   TSH 1.860 08/30/2018   TSH 0.56 09/20/2016    Cardiac Panel (last 3 results) No results for input(s): "CKTOTAL", "CKMB", "TROPONINIHS", "RELINDX" in the last 72 hours.  FINAL MEDICATION LIST END OF ENCOUNTER: Meds ordered this encounter  Medications   dapagliflozin propanediol (FARXIGA) 10 MG TABS tablet    Sig: Take 1 tablet (10 mg total) by mouth daily before breakfast.    Dispense:  90 tablet    Refill:  0     Medications Discontinued During This Encounter  Medication Reason   potassium chloride (KLOR-CON) 10 MEQ tablet Duplicate   Cholecalciferol (VITAMIN D) 50 MCG (2000 UT) CAPS    DEXILANT 60 MG capsule    fexofenadine (ALLEGRA ALLERGY) 180 MG tablet    simvastatin (ZOCOR) 20 MG tablet Duplicate     Current Outpatient Medications:    blood glucose meter kit and supplies KIT, Test once daily-DX-E11.69, Disp: 1 each, Rfl: 0   carvedilol (COREG) 6.25 MG tablet, Take 1 tablet (6.25 mg total) by mouth 2 (two) times daily., Disp: 180 tablet, Rfl: 3   cyclobenzaprine (FLEXERIL) 10 MG tablet, Take 1 tablet (10 mg total) by mouth 2 (two) times daily as needed for muscle spasms., Disp: 20 tablet, Rfl: 0   dapagliflozin propanediol (FARXIGA) 10 MG TABS tablet, Take 1 tablet (10 mg total) by mouth daily before breakfast., Disp: 90 tablet, Rfl: 0   diclofenac Sodium (VOLTAREN) 1 % GEL, APPLY 2 GRAMS TOPICALLY 4 TIMES DAILY, Disp: 100 g, Rfl: 0   docusate sodium (COLACE) 100 MG capsule, Take 100 mg by mouth daily as needed for mild constipation., Disp: , Rfl:     ENTRESTO 49-51 MG, Take 1 tablet by mouth 2 (two) times daily., Disp: , Rfl:    EQ ASPIRIN ADULT LOW DOSE 81 MG EC tablet, Take 1 tablet by mouth once daily, Disp: 90 tablet, Rfl: 0   etodolac (LODINE) 400 MG tablet, Take 400 mg by mouth 2 (two) times daily., Disp: , Rfl:    famotidine (PEPCID) 20 MG tablet, Take 20 mg by mouth daily., Disp: , Rfl:    Fish Oil-Cholecalciferol (OMEGA-3 & VITAMIN D3 GUMMIES PO), Take 2,000 mg by mouth daily at 12 noon., Disp: , Rfl:    fluticasone (FLONASE) 50 MCG/ACT nasal spray, Place 2 sprays into both nostrils in the morning and at bedtime., Disp: 16 mL, Rfl: 2   gabapentin (NEURONTIN) 300 MG capsule, TAKE 1 CAPSULE BY MOUTH THREE TIMES DAILY, Disp: 90 capsule, Rfl: 5   Glucose Blood (BLOOD GLUCOSE TEST STRIPS) STRP, Inject 1 each as directed daily., Disp: 100 strip, Rfl: 5   hydroxychloroquine (PLAQUENIL) 200 MG tablet, TAKE 1 TABLET BY MOUTH TWICE DAILY FOR RHEUMATOID ARTHRITIS, Disp: 60 tablet, Rfl: 2   Insulin Pen Needle (BD PEN NEEDLE NANO U/F) 32G X 4 MM MISC, 1 each by Does not apply route at bedtime., Disp: 100  each, Rfl: 11   lidocaine (LIDODERM) 5 %, Place 1 patch onto the skin daily. Remove & Discard patch within 12 hours or as directed by MD, Disp: 15 patch, Rfl: 0   linaclotide (LINZESS) 145 MCG CAPS capsule, Take 1 capsule (145 mcg total) by mouth daily before breakfast., Disp: 90 capsule, Rfl: 1   Misc. Devices (ROLLATOR ULTRA-LIGHT) MISC, Use as directed., Disp: 1 each, Rfl: 0   ondansetron (ZOFRAN) 4 MG tablet, TAKE 1 TABLET BY MOUTH EVERY 8 HOURS AS NEEDED FOR NAUSEA OR VOMITING, Disp: 90 tablet, Rfl: 0   potassium chloride (KLOR-CON) 10 MEQ tablet, Take 1 tablet by mouth twice daily, Disp: 180 tablet, Rfl: 0   pyridOXINE (VITAMIN B6) 100 MG tablet, Take 100 mg by mouth daily., Disp: , Rfl:    RESTASIS 0.05 % ophthalmic emulsion, 1 drop 2 (two) times daily., Disp: , Rfl:    rosuvastatin (CRESTOR) 20 MG tablet, Take 1 tablet (20 mg total) by mouth  daily., Disp: 90 tablet, Rfl: 3   spironolactone (ALDACTONE) 25 MG tablet, Take 25 mg by mouth daily., Disp: , Rfl:    TURMERIC PO, Take 500 mg by mouth., Disp: , Rfl:    venlafaxine XR (EFFEXOR-XR) 37.5 MG 24 hr capsule, TAKE 1 CAPSULE DAILY WITH BREAKFAST., Disp: 90 capsule, Rfl: 0  IMPRESSION:    ICD-10-CM   1. Chronic HFrEF (heart failure with reduced ejection fraction) (HCC)  I50.22 EKG 12-Lead    dapagliflozin propanediol (FARXIGA) 10 MG TABS tablet    Basic metabolic panel    Magnesium    Pro b natriuretic peptide (BNP)    PCV ECHOCARDIOGRAM COMPLETE    2. Cardiomyopathy, presumed nonischemic  I42.9 dapagliflozin propanediol (FARXIGA) 10 MG TABS tablet    Basic metabolic panel    Magnesium    Pro b natriuretic peptide (BNP)    PCV ECHOCARDIOGRAM COMPLETE    3. Benign hypertension  I10     4. Mixed hyperlipidemia  E78.2     5. History of TIA (transient ischemic attack)  Z86.73     6. Class 2 severe obesity due to excess calories with serious comorbidity and body mass index (BMI) of 39.0 to 39.9 in adult [facility])  E66.01    Z68.39        RECOMMENDATIONS: ASHNA DOROUGH is a 61 y.o. female whose past medical history and cardiovascular risk factors include: Seropositive rheumatoid arthritis (Lisa Briggs), osteoarthritis, DDD, benign essential hypertension, transient ischemic attack, mildly reduced left ventricular systolic function consistent with cardiomyopathy, obesity.  Chronic HFrEF (heart failure with reduced ejection fraction) (HCC) / Cardiomyopathy, presumed nonischemic Clinically compensated. No recent hospitalizations for congestive heart failure. Stage B, NYHA class II/III. We will start Farxiga 10 mg p.o. daily. Labs in 1 week to evaluate kidney function and electrolytes. Echo before the next office visit to reevaluate LVEF.  Benign hypertension Office blood pressures are well controlled. Medications reconciled. No changes warranted at this time.  Mixed  hyperlipidemia Currently on Crestor.   She denies myalgia or other side effects. Currently managed by primary care provider.  History of TIA (transient ischemic attack) Reemphasized importance of secondary prevention.  Class 2 severe obesity due to excess calories with serious comorbidity and body mass index (BMI) of 39.0 to 39.9 in adult [facility]) Body mass index is 39 kg/m. I reviewed with the patient the importance of diet, regular physical activity/exercise, weight loss.   Patient is educated on increasing physical activity gradually as tolerated.  With the goal of  moderate intensity exercise for 30 minutes a day 5 days a week.  Orders Placed This Encounter  Procedures   Basic metabolic panel   Magnesium   Pro b natriuretic peptide (BNP)   EKG 12-Lead   PCV ECHOCARDIOGRAM COMPLETE    --Continue cardiac medications as reconciled in final medication list. --Return in about 28 weeks (around 07/17/2022) for Follow up, cardiomyopathy. Or sooner if needed. --Continue follow-up with your primary care physician regarding the management of your other chronic comorbid conditions.  Patient's questions and concerns were addressed to her satisfaction. She voices understanding of the instructions provided during this encounter.   This note was created using a voice recognition software as a result there may be grammatical errors inadvertently enclosed that do not reflect the nature of this encounter. Every attempt is made to correct such errors.   Rex Kras, [place], [facility]  Pager: [phone] Office: [phone]

## 2022-01-04 NOTE — Progress Notes (Signed)
Office Visit Note  [name]             Date of Birth: 05/03/1960           MRN: [id number]             PCP: Roselee Nova, MD Referring: Carlena Hurl, PA-C Visit Date: 01/18/2022 Occupation: '@GUAROCC'$ @  Subjective:  Severe pain in right knee  History of Present Illness: Lisa Briggs is a 61 y.o. female history of rheumatoid arthritis and osteoarthritis.  She states she has been taking hydroxychloroquine 200 mg p.o. twice daily without any interruption.  She states for the last 1 months she has been having increased pain and discomfort in her right knee joint.  She injured her right knee about a year ago after falling.  She states that her right knee joint gives out on her when she is walking.  She was recently seen by her PCP for the right knee joint pain and discomfort.  She continues to have pain and discomfort and difficulty walking due to right knee joint pain.  She has been mobilizing with the help of a cane and using a knee brace.  None of the other joints are painful.  She has intermittent discomfort in the trochanteric region.  She has not noticed any joint swelling.  Activities of Daily Living:  Patient reports morning stiffness for several hours.   Patient Reports nocturnal pain.  Difficulty dressing/grooming: Denies Difficulty climbing stairs: Reports Difficulty getting out of chair: Reports Difficulty using hands for taps, buttons, cutlery, and/or writing: Reports  Review of Systems  Constitutional:  Positive for fatigue.  HENT:  Positive for mouth dryness. Negative for mouth sores.   Eyes:  Positive for dryness.  Respiratory:  Negative for shortness of breath.   Cardiovascular:  Negative for chest pain and palpitations.  Gastrointestinal:  Positive for constipation. Negative for blood in stool and diarrhea.  Endocrine: Negative for increased urination.  Genitourinary:  Negative for involuntary urination.  Musculoskeletal:  Positive for joint  pain, gait problem, joint pain, joint swelling, myalgias, muscle weakness, morning stiffness, muscle tenderness and myalgias.  Skin:  Negative for color change, rash, hair loss and sensitivity to sunlight.  Allergic/Immunologic: Negative for susceptible to infections.  Neurological:  Negative for dizziness and headaches.  Hematological:  Negative for swollen glands.  Psychiatric/Behavioral:  Positive for sleep disturbance. Negative for depressed mood. The patient is not nervous/anxious.     PMFS History:  Patient Active Problem List   Diagnosis Date Noted   Bilateral ankle pain 09/15/2021   Chronic HFrEF (heart failure with reduced ejection fraction) ([place]) 07/29/2021   Advance directive discussed with patient 01/27/2021   Medicare annual wellness visit, subsequent 01/27/2021   Needs flu shot 01/27/2021   At moderate risk for fall 01/27/2021   Prediabetes 02/19/2020   Menopause 11/25/2019   Cardiomyopathy ([place]) 11/19/2019   Rheumatoid arthritis involving multiple sites with positive rheumatoid factor ([place]) 11/19/2019   Primary osteoarthritis involving multiple joints 11/19/2019   Greater trochanteric bursitis, right 11/19/2019   Decreased activities of daily living (ADL) 11/19/2019   Gait disturbance 11/19/2019   Use of cane as ambulatory aid 11/19/2019   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    Class: Chronic   Spinal stenosis of lumbar region 04/25/2019    Class: Chronic   Status post lumbar laminectomy 04/25/2019   Chronic pain of right knee 09/13/2018   Chronic pain of left knee 07/08/2018  Chronic hip pain 07/08/2018   Edema 07/08/2018   Need for shingles vaccine 12/05/2017   Primary osteoarthritis of both hands 11/23/2017   Primary osteoarthritis of both knees 11/23/2017   Primary osteoarthritis of both feet 11/23/2017   DDD (degenerative disc disease), lumbar 11/23/2017   Vaccine counseling 08/13/2017   Sensitive skin 01/23/2017   Need for influenza  vaccination 01/23/2017   History of gastrointestinal stromal tumor (GIST) 04/20/2016   History of TIA (transient ischemic attack) 04/20/2016   Screening for breast cancer 04/20/2016   Estrogen deficiency 04/20/2016   Impaired fasting blood sugar 04/20/2016   Constipation 04/20/2016   History of fall 04/20/2016   Chronic radicular lumbar pain 01/27/2016   Encounter for health maintenance examination in adult 03/22/2015   Paresthesia 03/22/2015   Cognitive decline 03/22/2015   Screening for cervical cancer 03/22/2015   Vitamin D deficiency 03/22/2015   History of uterine leiomyoma 03/22/2015   Gastroesophageal reflux disease without esophagitis 03/02/2014   Chronic nausea 03/02/2014   Rhinitis, allergic 03/02/2014   Essential hypertension 03/02/2014   Hyperlipidemia 03/02/2014   Obesity with serious comorbidity 01/16/2012    Past Medical History:  Diagnosis Date   Allergy    Anemia    iron therapy for years as of 10/12; normal Hgb 08/2013   Anxiety    Arthritis    Cardiomyopathy ([place])    Chest pain 04/05/2011   cardiac eval, normal treadmill stress test, Dr. Tollie Eth   Chronic back pain    Constipation    Farsightedness    wears glasses, Eye care center   Gastrointestinal stromal tumor (GIST) ([place]) 06/2014   Dr. [name], [facility]   GERD (gastroesophageal reflux disease)    History of uterine fibroid    Hyperlipidemia    Hypertension    Paresthesia 09/2014   initially thought to be TIA, neurology consult in 12/2014 with other non TIA considerations.     Polyarthralgia    normal rheumatoid screen 01/2012   TIA (transient ischemic attack) 2015/16    Family History  Problem Relation Age of Onset   Hypertension Mother    Arthritis Mother    GER disease Mother    Glaucoma Mother    Breast cancer Mother 33   Stroke Father    Breast cancer Sister 77   Hypertension Sister    Leukemia Sister    Hypertension Sister    Colon cancer Brother 71    Diabetes Brother    Diabetes Maternal Aunt    Heart disease Maternal Aunt    Heart disease Maternal Uncle    Stroke Maternal Uncle    Heart disease Maternal Grandmother    Heart disease Maternal Grandfather    Healthy Son    Colon polyps Neg Hx    Esophageal cancer Neg Hx    Stomach cancer Neg Hx    Rectal cancer Neg Hx    Past Surgical History:  Procedure Laterality Date   COLONOSCOPY  01/2014   diverticulosis, othwerise normal - Dr. Owens Loffler   DILATATION & CURETTAGE/HYSTEROSCOPY WITH MYOSURE N/A 07/14/2021   Procedure: DILATATION & CURETTAGE/HYSTEROSCOPY WITH MYOSURE Reach;  Surgeon: Azucena Fallen, MD;  Location: [facility];  Service: Gynecology;  Laterality: N/A;   ESOPHAGOGASTRODUODENOSCOPY  2013   Dr. Benson Norway, gastritis   ESOPHAGOGASTRODUODENOSCOPY  [id number]   EUS N/A 04/16/2014   Procedure: UPPER ENDOSCOPIC ULTRASOUND (EUS) LINEAR;  Surgeon: Milus Banister, MD;  Location: [facility];  Service: Endoscopy;  Laterality: N/A;  gall stone surgery     KNEE ARTHROSCOPY Left    LAPAROSCOPIC GASTRIC RESECTION N/A 06/09/2014   Procedure: LAPAROSCOPIC GASTRIC MASS RESECTION;  Surgeon: [name], MD;  Location: [facility];  Service: General;  Laterality: N/A;   LIPOMA EXCISION     forehead   LUMBAR LAMINECTOMY N/A 04/25/2019   Procedure: LEFT L2-3 MICRODISCECTOMY, BILATERAL L5-S1 PARTIAL HEMILAMINECTOMY;  Surgeon: Jessy Oto, MD;  Location: [place];  Service: Orthopedics;  Laterality: N/A;   UPPER GASTROINTESTINAL ENDOSCOPY     UTERINE FIBROID SURGERY     Social History   Social History Narrative   Married, has 1 son in [place] and some grandchildren.  Glass blower/designer.  Active on job.  Does stretching and exercises daily as per physical therapy.  Works 12 hours daily.     Immunization History  Administered Date(s) Administered   Influenza,inj,Quad PF,6+ Mos 11/24/2013, 01/18/2015, 01/23/2017, 12/05/2017, 12/17/2018, 11/19/2019, 01/27/2021   PFIZER  Comirnaty(Gray Top)Covid-19 Tri-Sucrose Vaccine 07/12/2020   PFIZER(Purple Top)SARS-COV-2 Vaccination 06/16/2019, 07/08/2019, 01/15/2020   PNEUMOCOCCAL CONJUGATE-20 07/28/2021   Pneumococcal Polysaccharide-23 05/21/2015   Tdap 01/17/2011, 06/25/2021   Zoster Recombinat (Shingrix) 10/05/2017, 12/07/2017     Objective: Vital Signs: BP 124/84 (BP Location: Left Arm, Patient Position: Sitting, Cuff Size: Large)   Pulse 67   Resp 17   Ht 5' 7.5" (1.715 m)   Wt 249 lb (112.9 kg)   LMP 02/23/2015 (LMP Unknown)   BMI 38.42 kg/m    Physical Exam Vitals and nursing note reviewed.  Constitutional:      Appearance: She is well-developed.  HENT:     Head: Normocephalic and atraumatic.  Eyes:     Conjunctiva/sclera: Conjunctivae normal.  Cardiovascular:     Rate and Rhythm: Normal rate and regular rhythm.     Heart sounds: Normal heart sounds.  Pulmonary:     Effort: Pulmonary effort is normal.     Breath sounds: Normal breath sounds.  Abdominal:     General: Bowel sounds are normal.     Palpations: Abdomen is soft.  Musculoskeletal:     Cervical back: Normal range of motion.  Lymphadenopathy:     Cervical: No cervical adenopathy.  Skin:    General: Skin is warm and dry.     Capillary Refill: Capillary refill takes less than 2 seconds.  Neurological:     Mental Status: She is alert and oriented to person, place, and time.  Psychiatric:        Behavior: Behavior normal.      Musculoskeletal Exam: C-spine was in good range of motion.  Shoulder joints, elbow joints, wrist joints, MCPs PIPs and DIPs with good range of motion with no synovitis.  She had good range of motion of bilateral hip joints.  She had warmth and swelling in her right knee joint with small effusion on palpation.  She had painful range of motion of her right knee joint.  She was mobilizing with the help of a cane due to discomfort.  There was no tenderness over ankles or MTPs.  CDAI Exam: CDAI Score: 2.8   Patient Global: 4 mm; Provider Global: 4 mm Swollen: 1 ; Tender: 1  Joint Exam 01/18/2022      Right  Left  Knee  Swollen Tender        Investigation: No additional findings.  Imaging: DG Knee 1-2 Views Right  Result Date: 01/15/2022 CLINICAL DATA:  Knee swelling EXAM: RIGHT KNEE - 1-2 VIEW COMPARISON:  None Available. FINDINGS: No evidence  of fracture, dislocation, or joint effusion. There is tricompartmental osteophyte formation and mild medial compartment joint space narrowing compatible with degenerative change. Soft tissues are unremarkable. IMPRESSION: 1. No acute fracture or dislocation. 2. Mild tricompartmental degenerative change. Electronically Signed   By: Ronney Asters M.D.   On: 01/15/2022 21:38    Recent Labs: Lab Results  Component Value Date   WBC 6.8 08/28/2021   HGB 12.5 08/28/2021   PLT 279 08/28/2021   NA 142 08/28/2021   K 3.8 08/28/2021   CL 112 (H) 08/28/2021   CO2 23 08/28/2021   GLUCOSE 85 08/28/2021   BUN 20 08/28/2021   CREATININE 0.83 08/28/2021   BILITOT 0.6 08/28/2021   ALKPHOS 103 08/28/2021   AST 17 08/28/2021   ALT 15 08/28/2021   PROT 7.2 08/28/2021   ALBUMIN 3.7 08/28/2021   CALCIUM 9.4 08/28/2021   GFRAA 115 05/11/2020    Speciality Comments: PLQ Eye Exam: 10/18/2021 WNL @ [facility] Follow up in 1 year  Procedures:  No procedures performed Allergies: Kiwi extract, Mucinex dm [dm-guaifenesin er], and Peach [prunus persica]   Assessment / Plan:     Visit Diagnoses: Rheumatoid arthritis involving multiple sites with positive rheumatoid factor (HCC) - +RF, +CCP:  -She has been on hydroxychloroquine 200 mg p.o. twice daily without interruption in the treatment.  She states she has not noticed any pain and swelling in her hands.  She has been having increased pain and discomfort in her right knee joint since the fall last year.  She states recently the pain has been worse and her right knee joint is giving out on her.  Plan:  Sedimentation rate  High risk medication use - Plaquenil 200 mg 1 tablet by mouth twice daily. PLQ Eye Exam: 10/18/2021 - Plan: CBC with Differential/Platelet, COMPLETE METABOLIC PANEL WITH GFR  Primary osteoarthritis of both hands-no synovitis was noted on the examination.  Ganglion cyst of volar aspect of left wrist  Trochanteric bursitis of both hips-she has intermittent discomfort in her trochanteric region.  She had mild tenderness on palpation today.  Primary osteoarthritis of both knees-she has known history of osteoarthritis in her knee joints.  She also had recent x-ray of her right knee joint which showed mild osteoarthritis.  Chronic pain of right knee -she has been having pain and discomfort in her right knee joint since her fall in 2022.  Plan: MR KNEE RIGHT WO CONTRAST  Instability of right knee joint -she gives history of instability in her right knee joint.  She has been mobilizing with the help of a cane.  She is also using a knee brace.  She has warmth and swelling in her right knee joint.  I will schedule MRI to rule out internal derangement.  Plan: MR KNEE RIGHT WO CONTRAST  Primary osteoarthritis of both feet-she complains of some discomfort in her feet.  Closed avulsion fracture of left ankle with routine healing, subsequent encounter-guarded to the patient she has intermittent discomfort.  DDD (degenerative disc disease), lumbar -she had laminectomy in 2021.  She has intermittent discomfort.  She was followed by Dr. Louanne Skye in the past.  Osteopenia of left hip - DEXA updated on 08/05/21: The BMD measured at Femur Neck Left is 0.868 g/cm2 with a T-score of -1.2.  Use of calcium rich diet and vitamin D was discussed.  For the medical problems listed as follows:  Vitamin D deficiency  History of hyperlipidemia  Essential hypertension-blood pressure was normal today.  History of  TIA (transient ischemic attack)  Gastroesophageal reflux disease without  esophagitis  Orders: Orders Placed This Encounter  Procedures   MR KNEE RIGHT WO CONTRAST   CBC with Differential/Platelet   COMPLETE METABOLIC PANEL WITH GFR   Sedimentation rate   No orders of the defined types were placed in this encounter.    Follow-Up Instructions: Return in about 4 weeks (around 02/15/2022) for Rheumatoid arthritis, Osteoarthritis.   Bo Merino, MD  Note - This record has been created using Editor, commissioning.  Chart creation errors have been sought, but may not always  have been located. Such creation errors do not reflect on  the standard of medical care.

## 2022-01-13 ENCOUNTER — Ambulatory Visit
Admission: RE | Admit: 2022-01-13 | Discharge: 2022-01-13 | Disposition: A | Discharge status: Home / Self Care | Payer: Medicare HMO | Source: Ambulatory Visit | Attending: Family Medicine | Admitting: Family Medicine

## 2022-01-13 ENCOUNTER — Other Ambulatory Visit: Payer: Self-pay | Admitting: Family Medicine

## 2022-01-13 DIAGNOSIS — M25469 Effusion, unspecified knee: Secondary | ICD-10-CM

## 2022-01-14 ENCOUNTER — Other Ambulatory Visit: Payer: Self-pay | Admitting: Rheumatology

## 2022-01-14 DIAGNOSIS — M0579 Rheumatoid arthritis with rheumatoid factor of multiple sites without organ or systems involvement: Secondary | ICD-10-CM

## 2022-01-16 NOTE — Telephone Encounter (Signed)
Next Visit: 01/18/2022   Last Visit: 08/16/2021   Labs: 08/28/2021 Chloride 112   Eye exam: 10/18/2021 WNL    Current Dose per office note 08/16/2021: Plaquenil 200 mg 1 tablet by mouth twice daily   GV:SYVGCYOYOO arthritis involving multiple sites with positive rheumatoid factor    Last Fill: 10/20/2021     Okay to refill Plaquenil?

## 2022-01-18 ENCOUNTER — Other Ambulatory Visit: Payer: Medicare HMO

## 2022-01-18 ENCOUNTER — Encounter: Payer: Self-pay | Admitting: Rheumatology

## 2022-01-18 ENCOUNTER — Ambulatory Visit: Payer: Medicare HMO | Attending: Rheumatology | Admitting: Rheumatology

## 2022-01-18 VITALS — BP 124/84 | HR 67 | Resp 17 | Ht 67.5 in | Wt 249.0 lb

## 2022-01-18 DIAGNOSIS — E559 Vitamin D deficiency, unspecified: Secondary | ICD-10-CM

## 2022-01-18 DIAGNOSIS — M0579 Rheumatoid arthritis with rheumatoid factor of multiple sites without organ or systems involvement: Secondary | ICD-10-CM | POA: Diagnosis not present

## 2022-01-18 DIAGNOSIS — M19072 Primary osteoarthritis, left ankle and foot: Secondary | ICD-10-CM

## 2022-01-18 DIAGNOSIS — M85852 Other specified disorders of bone density and structure, left thigh: Secondary | ICD-10-CM

## 2022-01-18 DIAGNOSIS — M7062 Trochanteric bursitis, left hip: Secondary | ICD-10-CM

## 2022-01-18 DIAGNOSIS — S82892D Other fracture of left lower leg, subsequent encounter for closed fracture with routine healing: Secondary | ICD-10-CM

## 2022-01-18 DIAGNOSIS — I10 Essential (primary) hypertension: Secondary | ICD-10-CM

## 2022-01-18 DIAGNOSIS — G8929 Other chronic pain: Secondary | ICD-10-CM

## 2022-01-18 DIAGNOSIS — M67432 Ganglion, left wrist: Secondary | ICD-10-CM

## 2022-01-18 DIAGNOSIS — M170 Bilateral primary osteoarthritis of knee: Secondary | ICD-10-CM

## 2022-01-18 DIAGNOSIS — Z79899 Other long term (current) drug therapy: Secondary | ICD-10-CM

## 2022-01-18 DIAGNOSIS — Z8673 Personal history of transient ischemic attack (TIA), and cerebral infarction without residual deficits: Secondary | ICD-10-CM

## 2022-01-18 DIAGNOSIS — Z8639 Personal history of other endocrine, nutritional and metabolic disease: Secondary | ICD-10-CM

## 2022-01-18 DIAGNOSIS — M7061 Trochanteric bursitis, right hip: Secondary | ICD-10-CM

## 2022-01-18 DIAGNOSIS — M19041 Primary osteoarthritis, right hand: Secondary | ICD-10-CM | POA: Diagnosis not present

## 2022-01-18 DIAGNOSIS — M5136 Other intervertebral disc degeneration, lumbar region: Secondary | ICD-10-CM

## 2022-01-18 DIAGNOSIS — K219 Gastro-esophageal reflux disease without esophagitis: Secondary | ICD-10-CM

## 2022-01-18 DIAGNOSIS — M19042 Primary osteoarthritis, left hand: Secondary | ICD-10-CM

## 2022-01-18 DIAGNOSIS — M19071 Primary osteoarthritis, right ankle and foot: Secondary | ICD-10-CM

## 2022-01-18 DIAGNOSIS — M25361 Other instability, right knee: Secondary | ICD-10-CM

## 2022-01-18 DIAGNOSIS — M25561 Pain in right knee: Secondary | ICD-10-CM

## 2022-01-19 LAB — CBC WITH DIFFERENTIAL/PLATELET
Absolute Monocytes: 552 cells/uL (ref 200–950)
Basophils Absolute: 30 cells/uL (ref 0–200)
Basophils Relative: 0.5 %
Eosinophils Absolute: 222 cells/uL (ref 15–500)
Eosinophils Relative: 3.7 %
HCT: 37.8 % (ref 35.0–45.0)
Hemoglobin: 12 g/dL (ref 11.7–15.5)
Lymphs Abs: 2598 cells/uL (ref 850–3900)
MCH: 26.9 pg — ABNORMAL LOW (ref 27.0–33.0)
MCHC: 31.7 g/dL — ABNORMAL LOW (ref 32.0–36.0)
MCV: 84.8 fL (ref 80.0–100.0)
MPV: 9.7 fL (ref 7.5–12.5)
Monocytes Relative: 9.2 %
Neutro Abs: 2598 cells/uL (ref 1500–7800)
Neutrophils Relative %: 43.3 %
Platelets: 308 10*3/uL (ref 140–400)
RBC: 4.46 10*6/uL (ref 3.80–5.10)
RDW: 12.8 % (ref 11.0–15.0)
Total Lymphocyte: 43.3 %
WBC: 6 10*3/uL (ref 3.8–10.8)

## 2022-01-19 LAB — COMPLETE METABOLIC PANEL WITH GFR
AG Ratio: 1.5 (calc) (ref 1.0–2.5)
ALT: 17 U/L (ref 6–29)
AST: 22 U/L (ref 10–35)
Albumin: 4 g/dL (ref 3.6–5.1)
Alkaline phosphatase (APISO): 107 U/L (ref 37–153)
BUN: 13 mg/dL (ref 7–25)
CO2: 32 mmol/L (ref 20–32)
Calcium: 9 mg/dL (ref 8.6–10.4)
Chloride: 106 mmol/L (ref 98–110)
Creat: 0.71 mg/dL (ref 0.50–1.03)
Globulin: 2.7 g/dL (calc) (ref 1.9–3.7)
Glucose, Bld: 77 mg/dL (ref 65–99)
Potassium: 4.5 mmol/L (ref 3.5–5.3)
Sodium: 142 mmol/L (ref 135–146)
Total Bilirubin: 0.3 mg/dL (ref 0.2–1.2)
Total Protein: 6.7 g/dL (ref 6.1–8.1)
eGFR: 98 mL/min/{1.73_m2} (ref >=60)

## 2022-01-19 LAB — SEDIMENTATION RATE: Sed Rate: 14 mm/h (ref 0–30)

## 2022-01-19 NOTE — Progress Notes (Signed)
CBC, CMP and ESR normal.

## 2022-01-31 ENCOUNTER — Telehealth: Payer: Self-pay

## 2022-01-31 ENCOUNTER — Ambulatory Visit: Payer: Medicare HMO | Admitting: Medical

## 2022-01-31 NOTE — Patient Outreach (Signed)
  Care Coordination   01/31/2022 Name: Lisa Briggs MRN: [id number] DOB: [birth year]   Care Coordination Outreach Attempts:  An unsuccessful telephone outreach was attempted today to offer the patient information about available care coordination services as a benefit of their health plan.   Follow Up Plan:  Additional outreach attempts will be made to offer the patient care coordination information and services.   Encounter Outcome:  No Answer  Care Coordination Interventions Activated:  No   Care Coordination Interventions:  No, not indicated    Enzo Montgomery, RN,BSN,CCM [facility] Telephonic Care Management Coordinator Direct Phone: [phone] Toll Free: [phone] Fax: [phone]

## 2022-02-01 ENCOUNTER — Ambulatory Visit (HOSPITAL_COMMUNITY)
Admission: RE | Admit: 2022-02-01 | Discharge: 2022-02-01 | Disposition: A | Discharge status: Home / Self Care | Payer: Medicare HMO | Source: Ambulatory Visit | Attending: Rheumatology | Admitting: Rheumatology

## 2022-02-01 DIAGNOSIS — M25361 Other instability, right knee: Secondary | ICD-10-CM | POA: Insufficient documentation

## 2022-02-01 DIAGNOSIS — M25561 Pain in right knee: Secondary | ICD-10-CM | POA: Diagnosis present

## 2022-02-01 DIAGNOSIS — G8929 Other chronic pain: Secondary | ICD-10-CM | POA: Diagnosis present

## 2022-02-02 NOTE — Progress Notes (Signed)
Office Visit Note  [name]             Date of Birth: 1961/03/03           MRN: [id number]             PCP: Roselee Nova, MD Referring: Roselee Nova, MD Visit Date: 02/16/2022 Occupation: '@GUAROCC'$ @  Subjective:  Medication management  History of Present Illness: Lisa Briggs is a 61 y.o. female with history of rheumatoid arthritis and osteoarthritis.  Continues to have pain and discomfort in her knee joints.  She states her right knee joint continues to be painful.  She had a cortisone injection by Dr. Durward Fortes in her right knee joint on February 07, 2022.  She does scheduled to see Dr. Ninfa Linden on February 27, 2022 further evaluation of meniscal tear .   She denies pain and swelling in any of her other joints.  She has been tolerating hydroxychloroquine without any side effects.  He takes hydroxychloroquine 200 mg p.o. twice daily.  Activities of Daily Living:  Patient reports morning stiffness for several hours.   Patient Reports nocturnal pain.  Difficulty dressing/grooming: Reports Difficulty climbing stairs: Reports Difficulty getting out of chair: Reports Difficulty using hands for taps, buttons, cutlery, and/or writing: Reports  Review of Systems  Constitutional:  Negative for fatigue.  HENT:  Negative for mouth sores and mouth dryness.   Eyes:  Negative for dryness.  Respiratory:  Positive for shortness of breath.   Cardiovascular:  Negative for chest pain and palpitations.  Gastrointestinal:  Negative for blood in stool, constipation and diarrhea.  Endocrine: Negative for increased urination.  Genitourinary:  Negative for involuntary urination.  Musculoskeletal:  Positive for joint pain, gait problem, joint pain, joint swelling, myalgias, muscle weakness, morning stiffness, muscle tenderness and myalgias.  Skin:  Negative for color change, rash, hair loss and sensitivity to sunlight.  Allergic/Immunologic: Negative for susceptible to  infections.  Neurological:  Negative for dizziness and headaches.  Hematological:  Negative for swollen glands.  Psychiatric/Behavioral:  Negative for depressed mood and sleep disturbance. The patient is not nervous/anxious.     PMFS History:  Patient Active Problem List   Diagnosis Date Noted   Bilateral ankle pain 09/15/2021   Chronic HFrEF (heart failure with reduced ejection fraction) ([place]) 07/29/2021   Advance directive discussed with patient 01/27/2021   Medicare annual wellness visit, subsequent 01/27/2021   Needs flu shot 01/27/2021   At moderate risk for fall 01/27/2021   Prediabetes 02/19/2020   Menopause 11/25/2019   Cardiomyopathy ([place]) 11/19/2019   Rheumatoid arthritis involving multiple sites with positive rheumatoid factor ([place]) 11/19/2019   Primary osteoarthritis involving multiple joints 11/19/2019   Greater trochanteric bursitis, right 11/19/2019   Decreased activities of daily living (ADL) 11/19/2019   Gait disturbance 11/19/2019   Use of cane as ambulatory aid 11/19/2019   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    Class: Chronic   Spinal stenosis of lumbar region 04/25/2019    Class: Chronic   Status post lumbar laminectomy 04/25/2019   Chronic pain of right knee 09/13/2018   Chronic pain of left knee 07/08/2018   Chronic hip pain 07/08/2018   Edema 07/08/2018   Need for shingles vaccine 12/05/2017   Primary osteoarthritis of both hands 11/23/2017   Primary osteoarthritis of both knees 11/23/2017   Primary osteoarthritis of both feet 11/23/2017   DDD (degenerative disc disease), lumbar 11/23/2017   Vaccine counseling 08/13/2017  Sensitive skin 01/23/2017   Need for influenza vaccination 01/23/2017   History of gastrointestinal stromal tumor (GIST) 04/20/2016   History of TIA (transient ischemic attack) 04/20/2016   Screening for breast cancer 04/20/2016   Estrogen deficiency 04/20/2016   Impaired fasting blood sugar 04/20/2016    Constipation 04/20/2016   History of fall 04/20/2016   Chronic radicular lumbar pain 01/27/2016   Encounter for health maintenance examination in adult 03/22/2015   Paresthesia 03/22/2015   Cognitive decline 03/22/2015   Screening for cervical cancer 03/22/2015   Vitamin D deficiency 03/22/2015   History of uterine leiomyoma 03/22/2015   Gastroesophageal reflux disease without esophagitis 03/02/2014   Chronic nausea 03/02/2014   Rhinitis, allergic 03/02/2014   Essential hypertension 03/02/2014   Hyperlipidemia 03/02/2014   Obesity with serious comorbidity 01/16/2012    Past Medical History:  Diagnosis Date   Allergy    Anemia    iron therapy for years as of 10/12; normal Hgb 08/2013   Anxiety    Arthritis    Cardiomyopathy ([place])    Chest pain 04/05/2011   cardiac eval, normal treadmill stress test, Dr. Tollie Eth   Chronic back pain    Constipation    Farsightedness    wears glasses, Eye care center   Gastrointestinal stromal tumor (GIST) ([place]) 06/2014   Dr. [name], [facility]   GERD (gastroesophageal reflux disease)    History of uterine fibroid    Hyperlipidemia    Hypertension    Paresthesia 09/2014   initially thought to be TIA, neurology consult in 12/2014 with other non TIA considerations.     Polyarthralgia    normal rheumatoid screen 01/2012   TIA (transient ischemic attack) 2015/16    Family History  Problem Relation Age of Onset   Hypertension Mother    Arthritis Mother    GER disease Mother    Glaucoma Mother    Breast cancer Mother 72   Stroke Father    Breast cancer Sister 20   Hypertension Sister    Leukemia Sister    Hypertension Sister    Colon cancer Brother 41   Diabetes Brother    Diabetes Maternal Aunt    Heart disease Maternal Aunt    Heart disease Maternal Uncle    Stroke Maternal Uncle    Heart disease Maternal Grandmother    Heart disease Maternal Grandfather    Healthy Son    Colon polyps Neg Hx     Esophageal cancer Neg Hx    Stomach cancer Neg Hx    Rectal cancer Neg Hx    Past Surgical History:  Procedure Laterality Date   COLONOSCOPY  01/2014   diverticulosis, othwerise normal - Dr. Owens Loffler   DILATATION & CURETTAGE/HYSTEROSCOPY WITH MYOSURE N/A 07/14/2021   Procedure: DILATATION & CURETTAGE/HYSTEROSCOPY WITH MYOSURE Reach;  Surgeon: Azucena Fallen, MD;  Location: [facility];  Service: Gynecology;  Laterality: N/A;   ESOPHAGOGASTRODUODENOSCOPY  2013   Dr. Benson Norway, gastritis   ESOPHAGOGASTRODUODENOSCOPY  [id number]   EUS N/A 04/16/2014   Procedure: UPPER ENDOSCOPIC ULTRASOUND (EUS) LINEAR;  Surgeon: Milus Banister, MD;  Location: [facility];  Service: Endoscopy;  Laterality: N/A;   gall stone surgery     KNEE ARTHROSCOPY Left    LAPAROSCOPIC GASTRIC RESECTION N/A 06/09/2014   Procedure: LAPAROSCOPIC GASTRIC MASS RESECTION;  Surgeon: [name], MD;  Location: [facility];  Service: General;  Laterality: N/A;   LIPOMA EXCISION     forehead   LUMBAR LAMINECTOMY  N/A 04/25/2019   Procedure: LEFT L2-3 MICRODISCECTOMY, BILATERAL L5-S1 PARTIAL HEMILAMINECTOMY;  Surgeon: Jessy Oto, MD;  Location: [facility];  Service: Orthopedics;  Laterality: N/A;   UPPER GASTROINTESTINAL ENDOSCOPY     UTERINE FIBROID SURGERY     Social History   Social History Narrative   Married, has 1 son in [place] and some grandchildren.  Glass blower/designer.  Active on job.  Does stretching and exercises daily as per physical therapy.  Works 12 hours daily.     Immunization History  Administered Date(s) Administered   Influenza,inj,Quad PF,6+ Mos 11/24/2013, 01/18/2015, 01/23/2017, 12/05/2017, 12/17/2018, 11/19/2019, 01/27/2021   PFIZER Comirnaty(Gray Top)Covid-19 Tri-Sucrose Vaccine 07/12/2020   PFIZER(Purple Top)SARS-COV-2 Vaccination 06/16/2019, 07/08/2019, 01/15/2020   PNEUMOCOCCAL CONJUGATE-20 07/28/2021   Pneumococcal Polysaccharide-23 05/21/2015   Tdap 01/17/2011, 06/25/2021   Zoster  Recombinat (Shingrix) 10/05/2017, 12/07/2017     Objective: Vital Signs: BP 110/75 (BP Location: Left Arm, Patient Position: Sitting, Cuff Size: Large)   Pulse 71   Resp 17   Ht 5' 9.25" (1.759 m)   Wt 245 lb 9.6 oz (111.4 kg)   LMP 02/23/2015 (LMP Unknown)   BMI 36.01 kg/m    Physical Exam Vitals and nursing note reviewed.  Constitutional:      Appearance: She is well-developed.  HENT:     Head: Normocephalic and atraumatic.  Eyes:     Conjunctiva/sclera: Conjunctivae normal.  Cardiovascular:     Rate and Rhythm: Normal rate and regular rhythm.     Heart sounds: Normal heart sounds.  Pulmonary:     Effort: Pulmonary effort is normal.     Breath sounds: Normal breath sounds.  Abdominal:     General: Bowel sounds are normal.     Palpations: Abdomen is soft.  Musculoskeletal:     Cervical back: Normal range of motion.  Lymphadenopathy:     Cervical: No cervical adenopathy.  Skin:    General: Skin is warm and dry.     Capillary Refill: Capillary refill takes less than 2 seconds.  Neurological:     Mental Status: She is alert and oriented to person, place, and time.  Psychiatric:        Behavior: Behavior normal.      Musculoskeletal Exam: Cervical spine was in good range of motion.  Shoulders, elbows, wrist, MCPs PIPs and DIPs with good range of motion with no synovitis.  She had good range of motion of bilateral hip joints.  She has some warmth on palpation of her right knee joint without any effusion.  Left knee joint was in good range of motion.  There was no tenderness over ankles or MTPs.  CDAI Exam: CDAI Score: 1.4  Patient Global: 2 mm; Provider Global: 2 mm Swollen: 0 ; Tender: 1  Joint Exam 02/16/2022      Right  Left  Knee   Tender        Investigation: No additional findings.  Imaging: MR KNEE RIGHT WO CONTRAST  Result Date: 02/04/2022 CLINICAL DATA:  Chronic right knee pain. EXAM: MRI OF THE RIGHT KNEE WITHOUT CONTRAST TECHNIQUE: Multiplanar,  multisequence MR imaging of the knee was performed. No intravenous contrast was administered. COMPARISON:  None Available. FINDINGS: MENISCI Medial: Complex tear of the posterior horn of the medial meniscus. Oblique tear of the body of the medial meniscus extending to the inferior articular surface. Lateral: Degeneration of the body of the lateral meniscus. Oblique linear signal abnormality of the posterior horn-body junction of the lateral meniscus (image 12/series 11)  extending to the inferior articular surface concerning for a small tear. LIGAMENTS Cruciates: ACL and PCL are intact. Collaterals: Medial collateral ligament is intact. Lateral collateral ligament complex is intact. CARTILAGE Patellofemoral: Partial-thickness cartilage loss of the lateral patellofemoral compartment with areas of high-grade partial-thickness cartilage loss. Partial-thickness cartilage loss of the medial patellofemoral compartment. Medial: Full-thickness cartilage loss of the medial femorotibial compartment with subchondral reactive marrow edema and marginal osteophytes. Lateral: Mild partial-thickness cartilage loss of the lateral femorotibial compartment with focal area of high-grade partial-thickness cartilage loss along the posterior lateral tibial plateau with subchondral reactive marrow edema. JOINT: Large joint effusion. Normal Hoffa's fat-pad. No plical thickening. POPLITEAL FOSSA: Popliteus tendon is intact. Small Baker's cyst. EXTENSOR MECHANISM: Intact quadriceps tendon. Intact patellar tendon. Intact lateral patellar retinaculum. Intact medial patellar retinaculum. Intact MPFL. BONES: No aggressive osseous lesion. No fracture or dislocation. Other: No fluid collection or hematoma. Muscles are normal. IMPRESSION: 1. Complex tear of the posterior horn of the medial meniscus. Oblique tear of the body of the medial meniscus extending to the inferior articular surface. 2. Degeneration of the body of the lateral meniscus. Oblique  linear signal abnormality of the posterior horn-body junction of the lateral meniscus extending to the inferior articular surface concerning for a small tear. 3. Tricompartmental cartilage abnormalities as described above most severe in the medial femorotibial compartment. 4. Large joint effusion. Electronically Signed   By: Kathreen Devoid M.D.   On: 02/04/2022 09:29    Recent Labs: Lab Results  Component Value Date   WBC 6.0 01/18/2022   HGB 12.0 01/18/2022   PLT 308 01/18/2022   NA 142 01/18/2022   K 4.5 01/18/2022   CL 106 01/18/2022   CO2 32 01/18/2022   GLUCOSE 77 01/18/2022   BUN 13 01/18/2022   CREATININE 0.71 01/18/2022   BILITOT 0.3 01/18/2022   ALKPHOS 103 08/28/2021   AST 22 01/18/2022   ALT 17 01/18/2022   PROT 6.7 01/18/2022   ALBUMIN 3.7 08/28/2021   CALCIUM 9.0 01/18/2022   GFRAA 115 05/11/2020    Speciality Comments: PLQ Eye Exam: 10/18/2021 WNL @ [facility] Follow up in 1 year  Procedures:  No procedures performed Allergies: Kiwi extract, Mucinex dm [dm-guaifenesin er], and Peach [prunus persica]   Assessment / Plan:     Visit Diagnoses: Rheumatoid arthritis involving multiple sites with positive rheumatoid factor (HCC) - +RF, +CCP: She has been doing well on hydroxychloroquine 200 mg p.o. twice daily.  She denies any pain or swelling in her other joints besides her right knee joint.  Her eye examination was on October 18, 2021.  High risk medication use - Plaquenil 200 mg 1 tablet by mouth twice daily. PLQ Eye Exam: 10/18/2021.  Labs obtained on January 18, 2022 were reviewed CBC with differential and CMP with GFR were normal.  Sed rate was 14.  Labs do not indicate a rheumatoid arthritis flare.  Information regarding immunization was placed in the AVS.  Primary osteoarthritis of both hands-she has bilateral PIP and DIP thickening consistent with osteoarthritis.  Joint protection was discussed.  Trochanteric bursitis of both hips-she has intermittent discomfort.   IT band stretches were discussed.  Primary osteoarthritis of both knees-previous x-rays showed osteoarthritic changes.  Chronic pain of right knee - she has been having pain and discomfort in her right knee joint since her fall in 2022.  MRI obtained recently showed complex meniscal tear.  MRI findings were reviewed with the patient.  She was evaluated by Dr. Durward Fortes.  She had a cortisone injection in her right knee joint on February 07, 2022.  She had relief from the cortisone injection.  She is scheduled to see Dr. Ninfa Linden for possible surgery.  Instability of right knee joint-due to meniscal tear.  Primary osteoarthritis of both feet-proper fitting shoe were advised.  Closed avulsion fracture of left ankle with routine healing, subsequent encounter  DDD (degenerative disc disease), lumbar - she had laminectomy in 2021. She was followed by Dr. Louanne Skye in the past.  Osteopenia of left hip - DEXA updated on 08/05/21: The BMD measured at Femur Neck Left is 0.868 g/cm2 with a T-score of -1.2.  Calcium rich diet and exercise was emphasized.  Other medical problems are listed as follows:  History of hyperlipidemia  Vitamin D deficiency  Essential hypertension-blood pressure was normal today.  Gastroesophageal reflux disease without esophagitis  History of TIA (transient ischemic attack)  Orders: No orders of the defined types were placed in this encounter.  No orders of the defined types were placed in this encounter.    Follow-Up Instructions: Return in about 5 months (around 07/18/2022) for Rheumatoid arthritis, Osteoarthritis.   Bo Merino, MD  Note - This record has been created using Editor, commissioning.  Chart creation errors have been sought, but may not always  have been located. Such creation errors do not reflect on  the standard of medical care.

## 2022-02-05 NOTE — Progress Notes (Signed)
MRI of the knee shows complex meniscal tear, severe osteoarthritis and joint effusion.  Please refer to an orthopedic surgeon of her choice for the evaluation and treatment.

## 2022-02-07 ENCOUNTER — Encounter: Payer: Self-pay | Admitting: Orthopaedic Surgery

## 2022-02-07 ENCOUNTER — Ambulatory Visit (INDEPENDENT_AMBULATORY_CARE_PROVIDER_SITE_OTHER): Payer: Medicare HMO | Admitting: Orthopaedic Surgery

## 2022-02-07 VITALS — Ht 68.0 in | Wt 249.0 lb

## 2022-02-07 DIAGNOSIS — M170 Bilateral primary osteoarthritis of knee: Secondary | ICD-10-CM | POA: Diagnosis not present

## 2022-02-07 DIAGNOSIS — M1711 Unilateral primary osteoarthritis, right knee: Secondary | ICD-10-CM

## 2022-02-07 MED ORDER — LIDOCAINE HCL 1 % IJ SOLN
2.0000 mL | INTRAMUSCULAR | Status: AC | PRN
  Administered 2022-02-07: 2 mL

## 2022-02-07 MED ORDER — METHYLPREDNISOLONE ACETATE 40 MG/ML IJ SUSP
80.0000 mg | INTRAMUSCULAR | Status: AC | PRN
  Administered 2022-02-07: 80 mg via INTRA_ARTICULAR

## 2022-02-07 MED ORDER — BUPIVACAINE HCL 0.25 % IJ SOLN
2.0000 mL | INTRAMUSCULAR | Status: AC | PRN
  Administered 2022-02-07: 2 mL via INTRA_ARTICULAR

## 2022-02-07 NOTE — Progress Notes (Signed)
Office Visit Note   [name]           Date of Birth: January 14, 1961           MRN: [id number] Visit Date: 02/07/2022              Requested by: Roselee Nova, MD [place],  [place] [id number] PCP: Roselee Nova, MD   Assessment & Plan: Visit Diagnoses:  1. Primary osteoarthritis of both knees     Plan: Lisa Briggs has had a long history of problems referable to both knees.  Recently she has been having more trouble with her right knee.  She is being followed by Dr.Deveshwar for rheumatoid arthritis.  She is taking Plaquenil.  She also has a chronic history of back problems and is on gabapentin.  She uses a rolling walker and inquired about getting a leg motorized wheelchair because of her knee or back and other medical problems.  I had discussed this at length with her and felt like she needed to get letters from her cardiologist and even primary care doctor.  She has had plain films of her right knee I.  E.  The more symptomatic knee demonstrating significant degenerative changes particularly in the medial compartment.  She has had a follow-up MRI scan demonstrating advanced osteoarthritis in all 3 compartments but mostly in the medial compartment where there were large areas of full-thickness tearing.  In addition she has a complex tear of the medial meniscus.  I believe her problem is related to the arthritis.  She is stiff she is sore she has trouble when she keeps her knee bent for long period of time.  Long discussion regarding different treatment options.  She apparently has had a cortisone injection in the past and she like to have another today.  She would also like to consider knee replacement sometime well after the first of the year.  I will refer her to Dr. Ninfa Linden.  BMI was 38 Follow-Up Instructions: Return Refer to Dr. Ninfa Linden.   Orders:  No orders of the defined types were placed in this encounter.  No orders of the defined types were placed in  this encounter.     Procedures: Large Joint Inj: R knee on 02/07/2022 1:37 PM Indications: pain and diagnostic evaluation Details: 25 G 1.5 in needle, anteromedial approach  Arthrogram: No  Medications: 2 mL lidocaine 1 %; 80 mg methylPREDNISolone acetate 40 MG/ML; 2 mL bupivacaine 0.25 % Procedure, treatment alternatives, risks and benefits explained, specific risks discussed. Consent was given by the patient. Immediately prior to procedure a time out was called to verify the correct patient, procedure, equipment, support staff and site/side marked as required. Patient was prepped and draped in the usual sterile fashion.       Clinical Data: No additional findings.   Subjective: Chief Complaint  Patient presents with   Right Knee - Pain  Long history of problems referable to her right knee.  She has had prior films demonstrating degenerative changes in all 3 compartments but mostly in the medial compartment.  Dr.Deveshwar ordered an MRI scan demonstrating tricompartmental degenerative changes but mostly in the medial compartment where there are large areas of both bone.  In addition there was a complex tear of the medial meniscus.  She has a chronic history of back problems having had back surgery in 2022 by Dr. Louanne Skye.  She does use a rolling walker.  She is on gabapentin.  Taking Plaquenil for her rheumatoid arthritis.   HPI  Review of Systems   Objective: Vital Signs: Ht '5\' 8"'$  (1.727 m)   Wt 249 lb (112.9 kg)   LMP 02/23/2015 (LMP Unknown)   BMI 37.86 kg/m   Physical Exam Constitutional:      Appearance: She is well-developed.  Eyes:     Pupils: Pupils are equal, round, and reactive to light.  Pulmonary:     Effort: Pulmonary effort is normal.  Skin:    General: Skin is warm and dry.  Neurological:     Mental Status: She is alert and oriented to person, place, and time.  Psychiatric:        Behavior: Behavior normal.     Ortho Exam awake alert and oriented  x3.  Comfortable sitting.  Ambulates with somewhat of a limp referable to her right lower extremity and more, specifically, right knee. B MI is 79.  Her legs are not that large.  There was no knee effusion.  Mostly medial joint pain.  Full extension flex at least to 100 degrees without instability.  No popliteal pain.  No calf discomfort.  Painless range of motion both hips.  Straight leg raise negative  Specialty Comments:  No specialty comments available.  Imaging: No results found.   PMFS History: Patient Active Problem List   Diagnosis Date Noted   Bilateral ankle pain 09/15/2021   Chronic HFrEF (heart failure with reduced ejection fraction) ([place]) 07/29/2021   Advance directive discussed with patient 01/27/2021   Medicare annual wellness visit, subsequent 01/27/2021   Needs flu shot 01/27/2021   At moderate risk for fall 01/27/2021   Prediabetes 02/19/2020   Menopause 11/25/2019   Cardiomyopathy ([place]) 11/19/2019   Rheumatoid arthritis involving multiple sites with positive rheumatoid factor ([place]) 11/19/2019   Primary osteoarthritis involving multiple joints 11/19/2019   Greater trochanteric bursitis, right 11/19/2019   Decreased activities of daily living (ADL) 11/19/2019   Gait disturbance 11/19/2019   Use of cane as ambulatory aid 11/19/2019   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    Class: Chronic   Spinal stenosis of lumbar region 04/25/2019    Class: Chronic   Status post lumbar laminectomy 04/25/2019   Chronic pain of right knee 09/13/2018   Chronic pain of left knee 07/08/2018   Chronic hip pain 07/08/2018   Edema 07/08/2018   Need for shingles vaccine 12/05/2017   Primary osteoarthritis of both hands 11/23/2017   Primary osteoarthritis of both knees 11/23/2017   Primary osteoarthritis of both feet 11/23/2017   DDD (degenerative disc disease), lumbar 11/23/2017   Vaccine counseling 08/13/2017   Sensitive skin 01/23/2017   Need for influenza  vaccination 01/23/2017   History of gastrointestinal stromal tumor (GIST) 04/20/2016   History of TIA (transient ischemic attack) 04/20/2016   Screening for breast cancer 04/20/2016   Estrogen deficiency 04/20/2016   Impaired fasting blood sugar 04/20/2016   Constipation 04/20/2016   History of fall 04/20/2016   Chronic radicular lumbar pain 01/27/2016   Encounter for health maintenance examination in adult 03/22/2015   Paresthesia 03/22/2015   Cognitive decline 03/22/2015   Screening for cervical cancer 03/22/2015   Vitamin D deficiency 03/22/2015   History of uterine leiomyoma 03/22/2015   Gastroesophageal reflux disease without esophagitis 03/02/2014   Chronic nausea 03/02/2014   Rhinitis, allergic 03/02/2014   Essential hypertension 03/02/2014   Hyperlipidemia 03/02/2014   Obesity with serious comorbidity 01/16/2012   Past Medical History:  Diagnosis Date   Allergy  Anemia    iron therapy for years as of 10/12; normal Hgb 08/2013   Anxiety    Arthritis    Cardiomyopathy ([place])    Chest pain 04/05/2011   cardiac eval, normal treadmill stress test, Dr. Tollie Eth   Chronic back pain    Constipation    Farsightedness    wears glasses, [facility] care center   Gastrointestinal stromal tumor (GIST) ([place]) 06/2014   Dr. [name], [facility]   GERD (gastroesophageal reflux disease)    History of uterine fibroid    Hyperlipidemia    Hypertension    Paresthesia 09/2014   initially thought to be TIA, neurology consult in 12/2014 with other non TIA considerations.     Polyarthralgia    normal rheumatoid screen 01/2012   TIA (transient ischemic attack) 2015/16    Family History  Problem Relation Age of Onset   Hypertension Mother    Arthritis Mother    GER disease Mother    Glaucoma Mother    Breast cancer Mother 54   Stroke Father    Breast cancer Sister 75   Hypertension Sister    Leukemia Sister    Hypertension Sister    Colon cancer Brother 19    Diabetes Brother    Diabetes Maternal Aunt    Heart disease Maternal Aunt    Heart disease Maternal Uncle    Stroke Maternal Uncle    Heart disease Maternal Grandmother    Heart disease Maternal Grandfather    Healthy Son    Colon polyps Neg Hx    Esophageal cancer Neg Hx    Stomach cancer Neg Hx    Rectal cancer Neg Hx     Past Surgical History:  Procedure Laterality Date   COLONOSCOPY  01/2014   diverticulosis, othwerise normal - Dr. Owens Loffler   DILATATION & CURETTAGE/HYSTEROSCOPY WITH MYOSURE N/A 07/14/2021   Procedure: DILATATION & CURETTAGE/HYSTEROSCOPY WITH MYOSURE Reach;  Surgeon: Azucena Fallen, MD;  Location: [facility];  Service: Gynecology;  Laterality: N/A;   ESOPHAGOGASTRODUODENOSCOPY  2013   Dr. Benson Norway, gastritis   ESOPHAGOGASTRODUODENOSCOPY  315400   EUS N/A 04/16/2014   Procedure: UPPER ENDOSCOPIC ULTRASOUND (EUS) LINEAR;  Surgeon: Milus Banister, MD;  Location: [facility];  Service: Endoscopy;  Laterality: N/A;   gall stone surgery     KNEE ARTHROSCOPY Left    LAPAROSCOPIC GASTRIC RESECTION N/A 06/09/2014   Procedure: LAPAROSCOPIC GASTRIC MASS RESECTION;  Surgeon: [name], MD;  Location: [facility];  Service: General;  Laterality: N/A;   LIPOMA EXCISION     forehead   LUMBAR LAMINECTOMY N/A 04/25/2019   Procedure: LEFT L2-3 MICRODISCECTOMY, BILATERAL L5-S1 PARTIAL HEMILAMINECTOMY;  Surgeon: Jessy Oto, MD;  Location: [facility];  Service: Orthopedics;  Laterality: N/A;   UPPER GASTROINTESTINAL ENDOSCOPY     UTERINE FIBROID SURGERY     Social History   Occupational History   Occupation: IT sales professional: [employer]  Tobacco Use   Smoking status: Never    Passive exposure: Current   Smokeless tobacco: Never  Vaping Use   Vaping Use: Never used  Substance and Sexual Activity   Alcohol use: No    Alcohol/week: 0.0 standard drinks of alcohol   Drug use: No   Sexual activity: Not on file     Garald Balding,  MD   Note - This record has been created using Editor, commissioning.  Chart creation errors have been sought, but may not always  have been located. Such creation errors do not reflect on  the standard of medical care.

## 2022-02-12 NOTE — Progress Notes (Unsigned)
02/16/2022 Lisa Briggs [id number] 06/27/1960  Referring provider: Roselee Nova, MD Primary GI doctor:Dr. Tarri Glenn  (Dr. Ardis Hughs)  ASSESSMENT AND PLAN:   Gastroesophageal reflux disease without esophagitis Has been on NSAIDs 1 episode of abdominal discomfort with nausea and vomiting, intermittent dysphagia that comes and goes. Has only been on Pepcid. Instructed to stop NSAIDs, given lifestyle changes, will add on Protonix once daily, and schedule for barium swallow since patient is having chest discomfort and getting evaluated with cardiology with echocardiogram which is pending. We will schedule follow-up, she continues to have symptoms we will plan for endoscopy.  History of gastrointestinal stromal tumor (GIST) and history of diverticulosis Status post resection 2016 Some worsening reflux, nausea, mild abdominal discomfort We will plan on repeating CT abdomen pelvis to evaluate previous site of resection, and diverticulitis.  Chest pain with history of chronic HFrEF Has been having some chest discomfort, normal myocardial perfusion study 2021, pending echocardiogram and visit with cardiologist. Last EF 45 to 50% 2021 question chest wall discomfort as patient does have some tenderness to palpation.  Could also be potentially esophagitis/reflux, following up with cardiology and getting repeat echocardiogram which is pending.  Chronic idiopathic constipation - Increase fiber/ water intake, decrease caffeine, increase activity level. We will increase Linzess from 145 to 290.    Patient Care Team: Roselee Nova, MD as PCP - General (Family Medicine)  HISTORY OF PRESENT ILLNESS: 61 y.o. female with a past medical history of hypertension, chronic heart failure with reduced ejection fraction, 07/2019 normal myocardial perfusion study, echo 01/02/2022, rheumatoid arthritis, vitamin D deficiency, history of TIA, history of gastrointestinal stromal tumor, constipation,  family history of colon cancer in brother and others listed below presents for evaluation of GERD.   2012 abdominal ultrasound cholelithiasis 02/2012 EGD Dr. Benson Norway for gastritis biopsies not available 01/2014 Colonoscopy Dr. Ardis Hughs found diverticulosis but no polyps, recommended recall colonoscopy at 5 year interval.  2016 CT abdomen pelvis with incidental gastric wall mass 04/2014 EUS with Dr. Ardis Hughs 0.9 x 2.5 GIST anterior wall of stomach refer to surgeon to consider resection and also cholecystectomy at same time status post lap resection 06/2014 3 cm  with low mitotic activity and clear margins Dr. Rosendo Gros 02/11/2019 colonoscopy Dr. Ardis Hughs for screening with family history of first-degree relative with colorectal cancer before age 60 showed diverticulosis otherwise unremarkable recall 5 years  Patient was difficult historian.  She states last year when her mom was passing, she was having vaginal bleeding. S/p Surgery with GYN.  She states she has been having chills, she had one day of nausea with vomiting, no AB pain. She was having loose stool at the time and has had constipation.  She has BM every other day, will take miralax occ with linzess 145 mcg daily.  She has GERD, has been getting worse for 1-2 months.   She has had nocturnal symptoms with reflux into her throat and nose.  She has had some trouble swallowing and throat pain, this has improved, it comes and goes. Comes and goes.  She has had chest pain and went down her right arm neck, not worse with exertion. Felt just with talking had SOB and some hoarseness in her throat.  Mom passed and has had a lot of stress.  Went to see cardiology, normal stress test 07/2019, pending echo.  She is on etodolac only takes as needed, only took like 5 days out of Oct, none this   She  reports that she has never smoked. She has been exposed to tobacco smoke. She has never used smokeless tobacco. She reports that she does not drink alcohol and does  not use drugs.  Current Medications:   Current Outpatient Medications (Endocrine & Metabolic):    dapagliflozin propanediol (FARXIGA) 10 MG TABS tablet, Take 1 tablet (10 mg total) by mouth daily before breakfast.  Current Outpatient Medications (Cardiovascular):    carvedilol (COREG) 6.25 MG tablet, Take 1 tablet (6.25 mg total) by mouth 2 (two) times daily.   ENTRESTO 49-51 MG, Take 1 tablet by mouth 2 (two) times daily.   rosuvastatin (CRESTOR) 20 MG tablet, Take 1 tablet (20 mg total) by mouth daily.   spironolactone (ALDACTONE) 25 MG tablet, Take 25 mg by mouth daily.  Current Outpatient Medications (Respiratory):    fluticasone (FLONASE) 50 MCG/ACT nasal spray, Place 2 sprays into both nostrils in the morning and at bedtime.  Current Outpatient Medications (Analgesics):    EQ ASPIRIN ADULT LOW DOSE 81 MG EC tablet, Take 1 tablet by mouth once daily   etodolac (LODINE) 400 MG tablet, Take 400 mg by mouth 2 (two) times daily.   Current Outpatient Medications (Other):    blood glucose meter kit and supplies KIT, Test once daily-[id number]   cyclobenzaprine (FLEXERIL) 10 MG tablet, Take 1 tablet (10 mg total) by mouth 2 (two) times daily as needed for muscle spasms.   diclofenac Sodium (VOLTAREN) 1 % GEL, APPLY 2 GRAMS TOPICALLY 4 TIMES DAILY   docusate sodium (COLACE) 100 MG capsule, Take 100 mg by mouth daily as needed for mild constipation.   famotidine (PEPCID) 20 MG tablet, Take 20 mg by mouth daily.   Fish Oil-Cholecalciferol (OMEGA-3 & VITAMIN D3 GUMMIES PO), Take 2,000 mg by mouth daily at 12 noon.   Fish Oil-Cholecalciferol (OMEGA-3 + VITAMIN D3 PO), Take 1 capsule by mouth once a week. Take 2000 mg once a week.   gabapentin (NEURONTIN) 300 MG capsule, TAKE 1 CAPSULE BY MOUTH THREE TIMES DAILY   Glucose Blood (BLOOD GLUCOSE TEST STRIPS) STRP, Inject 1 each as directed daily.   hydroxychloroquine (PLAQUENIL) 200 MG tablet, TAKE 1 TABLET BY MOUTH TWICE DAILY FOR RHEUMATOID  ARTHRITIS   Insulin Pen Needle (BD PEN NEEDLE NANO U/F) 32G X 4 MM MISC, 1 each by Does not apply route at bedtime.   lidocaine (LIDODERM) 5 %, Place 1 patch onto the skin daily. Remove & Discard patch within 12 hours or as directed by MD   linaclotide (LINZESS) 145 MCG CAPS capsule, Take 1 capsule (145 mcg total) by mouth daily before breakfast.   Misc. Devices (ROLLATOR ULTRA-LIGHT) MISC, Use as directed.   ondansetron (ZOFRAN) 4 MG tablet, TAKE 1 TABLET BY MOUTH EVERY 8 HOURS AS NEEDED FOR NAUSEA OR VOMITING   potassium chloride (KLOR-CON) 10 MEQ tablet, Take 1 tablet by mouth twice daily   pyridOXINE (VITAMIN B6) 100 MG tablet, Take 100 mg by mouth daily.   RESTASIS 0.05 % ophthalmic emulsion, 1 drop 2 (two) times daily.   TURMERIC PO, Take 500 mg by mouth.   venlafaxine XR (EFFEXOR-XR) 37.5 MG 24 hr capsule, TAKE 1 CAPSULE DAILY WITH BREAKFAST.  Medical History:  Past Medical History:  Diagnosis Date   Allergy    Anemia    iron therapy for years as of 10/12; normal Hgb 08/2013   Anxiety    Arthritis    Cardiomyopathy ([place])    Chest pain 04/05/2011   cardiac eval, normal treadmill stress  test, Dr. Tollie Eth   Chronic back pain    Constipation    Farsightedness    wears glasses, Eye care center   Gastrointestinal stromal tumor (GIST) ([place]) 06/2014   Dr. [name], [facility]   GERD (gastroesophageal reflux disease)    History of uterine fibroid    Hyperlipidemia    Hypertension    Paresthesia 09/2014   initially thought to be TIA, neurology consult in 12/2014 with other non TIA considerations.     Polyarthralgia    normal rheumatoid screen 01/2012   TIA (transient ischemic attack) 2015/16   Allergies:  Allergies  Allergen Reactions   Kiwi Extract Hives and Itching   Mucinex Dm [Dm-Guaifenesin Er] Nausea Only    Messes up her stomach. Pt report on 01/21/15   Peach [Prunus Persica] Itching    Peach peeling makes pt itch     Surgical History:   She  has a past surgical history that includes Lipoma excision; Knee arthroscopy (Left); Uterine fibroid surgery; gall stone surgery; Colonoscopy (01/2014); Esophagogastroduodenoscopy (2013); Esophagogastroduodenoscopy (818563); EUS (N/A, 04/16/2014); Laparoscopic gastric resection (N/A, 06/09/2014); Upper gastrointestinal endoscopy; Lumbar laminectomy (N/A, 04/25/2019); and Dilatation & curettage/hysteroscopy with myosure (N/A, 07/14/2021). Family History:  Her family history includes Arthritis in her mother; Breast cancer (age of onset: 42) in her sister; Breast cancer (age of onset: 58) in her mother; Colon cancer (age of onset: 20) in her brother; Diabetes in her brother and maternal aunt; GER disease in her mother; Glaucoma in her mother; Healthy in her son; Heart disease in her maternal aunt, maternal grandfather, maternal grandmother, and maternal uncle; Hypertension in her mother, sister, and sister; Leukemia in her sister; Stroke in her father and maternal uncle.  REVIEW OF SYSTEMS  : All other systems reviewed and negative except where noted in the History of Present Illness.  PHYSICAL EXAM: BP 124/82   Pulse (!) 57   Ht _0  (1.753 m)   Wt 246 lb (111.6 kg)   LMP 02/23/2015 (LMP Unknown)   BMI 36.33 kg/m  General:   Pleasant, well developed female in no acute distress Head:   Normocephalic and atraumatic. Eyes:  sclerae anicteric,conjunctive pink  Heart:   regular rate and rhythm Pulm:  Clear anteriorly; no wheezing, mild chest wall discomfort to palpation. Abdomen:   Soft, Obese AB, Sluggish bowel sounds. No tenderness . , No organomegaly appreciated. Rectal: Not evaluated Extremities:  Without edema. Msk: Symmetrical without gross deformities. Peripheral pulses intact.  Walks with cane. Neurologic:  Alert and  oriented x4;  No focal deficits.  Skin:   Dry and intact without significant lesions or rashes. Psychiatric:  Cooperative. Normal mood and affect.    Vladimir Crofts,  PA-C 11:17 AM

## 2022-02-16 ENCOUNTER — Encounter: Payer: Self-pay | Admitting: Physician Assistant

## 2022-02-16 ENCOUNTER — Ambulatory Visit (INDEPENDENT_AMBULATORY_CARE_PROVIDER_SITE_OTHER): Payer: Medicare HMO | Admitting: Physician Assistant

## 2022-02-16 ENCOUNTER — Ambulatory Visit: Payer: Medicare HMO | Attending: Rheumatology | Admitting: Rheumatology

## 2022-02-16 ENCOUNTER — Encounter: Payer: Self-pay | Admitting: Rheumatology

## 2022-02-16 VITALS — BP 124/82 | HR 57 | Ht 69.0 in | Wt 246.0 lb

## 2022-02-16 VITALS — BP 110/75 | HR 71 | Resp 17 | Ht 69.25 in | Wt 245.6 lb

## 2022-02-16 DIAGNOSIS — K5904 Chronic idiopathic constipation: Secondary | ICD-10-CM | POA: Diagnosis not present

## 2022-02-16 DIAGNOSIS — Z8673 Personal history of transient ischemic attack (TIA), and cerebral infarction without residual deficits: Secondary | ICD-10-CM

## 2022-02-16 DIAGNOSIS — M67432 Ganglion, left wrist: Secondary | ICD-10-CM

## 2022-02-16 DIAGNOSIS — Z8509 Personal history of malignant neoplasm of other digestive organs: Secondary | ICD-10-CM

## 2022-02-16 DIAGNOSIS — M170 Bilateral primary osteoarthritis of knee: Secondary | ICD-10-CM

## 2022-02-16 DIAGNOSIS — K219 Gastro-esophageal reflux disease without esophagitis: Secondary | ICD-10-CM | POA: Diagnosis not present

## 2022-02-16 DIAGNOSIS — M0579 Rheumatoid arthritis with rheumatoid factor of multiple sites without organ or systems involvement: Secondary | ICD-10-CM

## 2022-02-16 DIAGNOSIS — M19072 Primary osteoarthritis, left ankle and foot: Secondary | ICD-10-CM

## 2022-02-16 DIAGNOSIS — M19041 Primary osteoarthritis, right hand: Secondary | ICD-10-CM | POA: Diagnosis not present

## 2022-02-16 DIAGNOSIS — Z79899 Other long term (current) drug therapy: Secondary | ICD-10-CM

## 2022-02-16 DIAGNOSIS — E559 Vitamin D deficiency, unspecified: Secondary | ICD-10-CM

## 2022-02-16 DIAGNOSIS — S82892D Other fracture of left lower leg, subsequent encounter for closed fracture with routine healing: Secondary | ICD-10-CM

## 2022-02-16 DIAGNOSIS — M85852 Other specified disorders of bone density and structure, left thigh: Secondary | ICD-10-CM

## 2022-02-16 DIAGNOSIS — R079 Chest pain, unspecified: Secondary | ICD-10-CM

## 2022-02-16 DIAGNOSIS — I10 Essential (primary) hypertension: Secondary | ICD-10-CM

## 2022-02-16 DIAGNOSIS — Z8639 Personal history of other endocrine, nutritional and metabolic disease: Secondary | ICD-10-CM

## 2022-02-16 DIAGNOSIS — M25361 Other instability, right knee: Secondary | ICD-10-CM

## 2022-02-16 DIAGNOSIS — M19071 Primary osteoarthritis, right ankle and foot: Secondary | ICD-10-CM

## 2022-02-16 DIAGNOSIS — M19042 Primary osteoarthritis, left hand: Secondary | ICD-10-CM

## 2022-02-16 DIAGNOSIS — M5136 Other intervertebral disc degeneration, lumbar region: Secondary | ICD-10-CM

## 2022-02-16 DIAGNOSIS — G8929 Other chronic pain: Secondary | ICD-10-CM

## 2022-02-16 DIAGNOSIS — M7061 Trochanteric bursitis, right hip: Secondary | ICD-10-CM

## 2022-02-16 DIAGNOSIS — M7062 Trochanteric bursitis, left hip: Secondary | ICD-10-CM

## 2022-02-16 DIAGNOSIS — R1084 Generalized abdominal pain: Secondary | ICD-10-CM

## 2022-02-16 DIAGNOSIS — M25561 Pain in right knee: Secondary | ICD-10-CM

## 2022-02-16 DIAGNOSIS — I5022 Chronic systolic (congestive) heart failure: Secondary | ICD-10-CM

## 2022-02-16 MED ORDER — PANTOPRAZOLE SODIUM 40 MG PO TBEC: 40.0000 mg | DELAYED_RELEASE_TABLET | Freq: Every day | ORAL | 0 refills | Status: DC

## 2022-02-16 MED ORDER — LINACLOTIDE 290 MCG PO CAPS: 290.0000 ug | ORAL_CAPSULE | Freq: Every day | ORAL | 3 refills | Status: DC

## 2022-02-16 NOTE — Patient Instructions (Addendum)
Standing Labs We placed an order today for your standing lab work.   Please have your standing labs drawn in March  Please have your labs drawn 2 weeks prior to your appointment so that the provider can discuss your lab results at your appointment.  Please note that you may see your imaging and lab results in [facility] before we have reviewed them. We will contact you once all results are reviewed. Please allow our office up to 72 hours to thoroughly review all of the results before contacting the office for clarification of your results.  Lab hours are:   Monday through Thursday from 8:00 am -12:30 pm and 1:00 pm-5:00 pm and Friday from 8:00 am-12:00 pm.  Please be advised, all patients with office appointments requiring lab work will take precedent over walk-in lab work.   Labs are drawn by [facility]. Please bring your co-pay at the time of your lab draw.  You may receive a bill from [facility] for your lab work.  Please note if you are on Hydroxychloroquine and and an order has been placed for a Hydroxychloroquine level, you will need to have it drawn 4 hours or more after your last dose.  If you wish to have your labs drawn at another location, please call the office 24 hours in advance so we can fax the orders.  The office is located at [address] [place], [place], [place] [address] No appointment is necessary.    If you have any questions regarding directions or hours of operation,  please call [phone].   As a reminder, please drink plenty of water prior to coming for your lab work. Thanks!   Vaccines You are taking a medication(s) that can suppress your immune system.  The following immunizations are recommended: Flu annually Covid-19  Td/Tdap (tetanus, diphtheria, pertussis) every 10 years Pneumonia (Prevnar 15 then Pneumovax 23 at least 1 year apart.  Alternatively, can take Prevnar 20 without needing additional dose) Shingrix: 2 doses from 4 weeks to 6 months  apart  Please check with your PCP to make sure you are up to date.

## 2022-02-16 NOTE — Patient Instructions (Signed)
_______________________________________________________  If you are age 59 or older, your body mass index should be between 23-30. Your Body mass index is 36.33 kg/m. If this is out of the aforementioned range listed, please consider follow up with your Primary Care Provider.  If you are age 65 or younger, your body mass index should be between 19-25. Your Body mass index is 36.33 kg/m. If this is out of the aformentioned range listed, please consider follow up with your Primary Care Provider.   ________________________________________________________  The [facility] providers would like to encourage you to use [facility] to communicate with providers for non-urgent requests or questions.  Due to long hold times on the telephone, sending your provider a message by [facility] may be a faster and more efficient way to get a response.  Please allow 48 business hours for a response.  Please remember that this is for non-urgent requests.  _______________________________________________________  Dennis Bast have been scheduled for a Barium Esophogram at [facility] (1st floor of the hospital) on 03-01-2022 at 11am. Please arrive 30 minutes prior to your appointment for registration. Make certain not to have anything to eat or drink 3 hours prior to your test. If you need to reschedule for any reason, please contact radiology at [phone] to do so. __________________________________________________________________ A barium swallow is an examination that concentrates on views of the esophagus. This tends to be a double contrast exam (barium and two liquids which, when combined, create a gas to distend the wall of the oesophagus) or single contrast (non-ionic iodine based). The study is usually tailored to your symptoms so a good history is essential. Attention is paid during the study to the form, structure and configuration of the esophagus, looking for functional disorders (such as aspiration, dysphagia,  achalasia, motility and reflux) EXAMINATION You may be asked to change into a gown, depending on the type of swallow being performed. A radiologist and radiographer will perform the procedure. The radiologist will advise you of the type of contrast selected for your procedure and direct you during the exam. You will be asked to stand, sit or lie in several different positions and to hold a small amount of fluid in your mouth before being asked to swallow while the imaging is performed .In some instances you may be asked to swallow barium coated marshmallows to assess the motility of a solid food bolus. The exam can be recorded as a digital or video fluoroscopy procedure. POST PROCEDURE It will take 1-2 days for the barium to pass through your system. To facilitate this, it is important, unless otherwise directed, to increase your fluids for the next 24-48hrs and to resume your normal diet.  This test typically takes about 30 minutes to perform. __________________________________________________________________________________  Dennis Bast have been scheduled for a CT scan of the abdomen and pelvis at [facility], 1st floor Radiology. You are scheduled on 02-27-2022 at 4:30pm. You should arrive 30 minutes prior to your appointment time for registration.  We are giving you 2 bottles of contrast today that you will need to drink before arriving for the exam. The solution may taste better if refrigerated so put them in the refrigerator when you get home, but do NOT add ice or any other liquid to this solution as that would dilute it. Shake well before drinking.   Please follow the written instructions below on the day of your exam:   1) Do not eat anything after 12:30pm (4 hours prior to your test)   2)  Drink 1 bottle of contrast @ 2:30pm (2 hours prior to your exam)  Remember to shake well before drinking and do NOT pour over ice.     Drink 1 bottle of contrast @ 3:30pm (1 hour prior to your exam)    You may take any medications as prescribed with a small amount of water, if necessary. If you take any of the following medications: METFORMIN, GLUCOPHAGE, GLUCOVANCE, AVANDAMET, RIOMET, FORTAMET, [place] MET, JANUMET, GLUMETZA or METAGLIP, you MAY be asked to HOLD this medication 48 hours AFTER the exam.   The purpose of you drinking the oral contrast is to aid in the visualization of your intestinal tract. The contrast solution may cause some diarrhea. Depending on your individual set of symptoms, you may also receive an intravenous injection of x-ray contrast/dye. Plan on being at [facility] for 45 minutes or longer, depending on the type of exam you are having performed.   If you have any questions regarding your exam or if you need to reschedule, you may call [facility] at [phone] between the hours of 8:00 am and 5:00 pm, Monday-Friday.      Diverticulosis Diverticulosis is a condition that develops when small pouches (diverticula) form in the wall of the large intestine (colon). The colon is where water is absorbed and stool (feces) is formed. The pouches form when the inside layer of the colon pushes through weak spots in the outer layers of the colon. You may have a few pouches or many of them. The pouches usually do not cause problems unless they become inflamed or infected. When this happens, the condition is called diverticulitis- this is left lower quadrant pain, diarrhea, fever, chills, nausea or vomiting.  If this occurs please call the office or go to the hospital. Sometimes these patches without inflammation can also have painless bleeding associated with them, if this happens please call the office or go to the hospital. Preventing constipation and increasing fiber can help reduce diverticula and prevent complications. Even if you feel you have a high-fiber diet, suggest getting on Benefiber or Cirtracel 2 times daily.

## 2022-02-27 ENCOUNTER — Ambulatory Visit: Payer: Medicare HMO | Admitting: Orthopaedic Surgery

## 2022-02-27 ENCOUNTER — Ambulatory Visit (HOSPITAL_COMMUNITY)
Admission: RE | Admit: 2022-02-27 | Discharge: 2022-02-27 | Disposition: A | Discharge status: Home / Self Care | Payer: Medicare HMO | Source: Ambulatory Visit | Attending: Physician Assistant | Admitting: Physician Assistant

## 2022-02-27 DIAGNOSIS — R1084 Generalized abdominal pain: Secondary | ICD-10-CM | POA: Diagnosis present

## 2022-02-27 DIAGNOSIS — Z8509 Personal history of malignant neoplasm of other digestive organs: Secondary | ICD-10-CM | POA: Diagnosis present

## 2022-02-27 MED ORDER — IOHEXOL 300 MG/ML  SOLN
100.0000 mL | Freq: Once | INTRAMUSCULAR | Status: AC | PRN
  Administered 2022-02-27: 100 mL via INTRAVENOUS

## 2022-02-27 MED ORDER — SODIUM CHLORIDE (PF) 0.9 % IJ SOLN
INTRAMUSCULAR | Status: AC
  Filled 2022-02-27: qty 50

## 2022-03-01 ENCOUNTER — Ambulatory Visit (HOSPITAL_COMMUNITY)
Admission: RE | Admit: 2022-03-01 | Discharge: 2022-03-01 | Disposition: A | Discharge status: Home / Self Care | Payer: Medicare HMO | Source: Ambulatory Visit | Attending: Physician Assistant | Admitting: Physician Assistant

## 2022-03-01 DIAGNOSIS — K219 Gastro-esophageal reflux disease without esophagitis: Secondary | ICD-10-CM | POA: Diagnosis present

## 2022-03-09 ENCOUNTER — Other Ambulatory Visit: Payer: Self-pay | Admitting: Cardiology

## 2022-03-09 DIAGNOSIS — I429 Cardiomyopathy, unspecified: Secondary | ICD-10-CM

## 2022-03-09 DIAGNOSIS — I5022 Chronic systolic (congestive) heart failure: Secondary | ICD-10-CM

## 2022-03-30 NOTE — Progress Notes (Signed)
Reviewed and agree with management plans. ? ?Niley Helbig L. Dorleen Kissel, MD, MPH  ?

## 2022-04-04 NOTE — Progress Notes (Deleted)
04/04/2022 Lisa Briggs [id number] 08/20/1960  Referring provider: Roselee Nova, MD Primary GI doctor:Dr. Tarri Glenn  (Dr. Ardis Hughs)  ASSESSMENT AND PLAN:   Gastroesophageal reflux disease without esophagitis Has been on NSAIDs 1 episode of abdominal discomfort with nausea and vomiting, intermittent dysphagia that comes and goes. Has only been on Pepcid. Instructed to stop NSAIDs, given lifestyle changes, will add on Protonix once daily, and schedule for barium swallow since patient is having chest discomfort and getting evaluated with cardiology with echocardiogram which is pending. We will schedule follow-up, she continues to have symptoms we will plan for endoscopy.  History of gastrointestinal stromal tumor (GIST) and history of diverticulosis Status post resection 2016 Some worsening reflux, nausea, mild abdominal discomfort We will plan on repeating CT abdomen pelvis to evaluate previous site of resection, and diverticulitis.  Chest pain with history of chronic HFrEF Has been having some chest discomfort, normal myocardial perfusion study 2021, pending echocardiogram and visit with cardiologist. Last EF 45 to 50% 2021 question chest wall discomfort as patient does have some tenderness to palpation.  Could also be potentially esophagitis/reflux, following up with cardiology and getting repeat echocardiogram which is pending.  Chronic idiopathic constipation - Increase fiber/ water intake, decrease caffeine, increase activity level. We will increase Linzess from 145 to 290.    Patient Care Team: Roselee Nova, MD as PCP - General (Family Medicine)  HISTORY OF PRESENT ILLNESS: 62 y.o. female with a past medical history of hypertension, chronic heart failure with reduced ejection fraction, 07/2019 normal myocardial perfusion study, echo 01/02/2022, rheumatoid arthritis, vitamin D deficiency, history of TIA, history of gastrointestinal stromal tumor, constipation,  family history of colon cancer in brother and others listed below presents for evaluation of GERD.   2012 abdominal ultrasound cholelithiasis 02/2012 EGD Dr. Benson Norway for gastritis biopsies not available 01/2014 Colonoscopy Dr. Ardis Hughs found diverticulosis but no polyps, recommended recall colonoscopy at 5 year interval.  2016 CT abdomen pelvis with incidental gastric wall mass 04/2014 EUS with Dr. Ardis Hughs 0.9 x 2.5 GIST anterior wall of stomach refer to surgeon to consider resection and also cholecystectomy at same time status post lap resection 06/2014 3 cm  with low mitotic activity and clear margins Dr. Rosendo Gros 02/11/2019 colonoscopy Dr. Ardis Hughs for screening with family history of first-degree relative with colorectal cancer before age 33 showed diverticulosis otherwise unremarkable recall 5 years  Patient was difficult historian. 02/16/2022 office visit for nausea without vomiting.  GERD worsening 1 to 2 months.  Dysphagia intermittently.  Chest discomfort. Was post to see cardiology for echocardiogram.  I do not see where this was done. 02/27/2022 CT abdomen pelvis with contrast showed no acute findings, status postsurgical resection of August no findings of locally recurrent disease.  No diverticulosis.  Normal liver normal pancreas normal spleen no ductal dilatation. 03/01/2022 barium swallow showed no evidence of hiatal hernia, GERD 13 mm past fairly new esophagus.  Patient having some improvement with Linzess and PPI. Due for colonoscopy 02/2024, EGD potentially will need cardiac clearance. Previous echocardiogram 2021 ejection fraction 45-50%.   She  reports that she has never smoked. She has been exposed to tobacco smoke. She has never used smokeless tobacco. She reports that she does not drink alcohol and does not use drugs.  Current Medications:   Current Outpatient Medications (Endocrine & Metabolic):    FARXIGA 10 MG TABS tablet, TAKE 1 TABLET BY MOUTH ONCE DAILY BEFORE  BREAKFAST  Current Outpatient Medications (Cardiovascular):  carvedilol (COREG) 6.25 MG tablet, Take 1 tablet (6.25 mg total) by mouth 2 (two) times daily.   ENTRESTO 49-51 MG, Take 1 tablet by mouth 2 (two) times daily.   rosuvastatin (CRESTOR) 20 MG tablet, Take 1 tablet (20 mg total) by mouth daily.   spironolactone (ALDACTONE) 25 MG tablet, Take 25 mg by mouth daily.  Current Outpatient Medications (Respiratory):    fluticasone (FLONASE) 50 MCG/ACT nasal spray, Place 2 sprays into both nostrils in the morning and at bedtime.  Current Outpatient Medications (Analgesics):    EQ ASPIRIN ADULT LOW DOSE 81 MG EC tablet, Take 1 tablet by mouth once daily   etodolac (LODINE) 400 MG tablet, Take 400 mg by mouth 2 (two) times daily.   Current Outpatient Medications (Other):    blood glucose meter kit and supplies KIT, Test once daily-[id number]   cyclobenzaprine (FLEXERIL) 10 MG tablet, Take 1 tablet (10 mg total) by mouth 2 (two) times daily as needed for muscle spasms.   diclofenac Sodium (VOLTAREN) 1 % GEL, APPLY 2 GRAMS TOPICALLY 4 TIMES DAILY   docusate sodium (COLACE) 100 MG capsule, Take 100 mg by mouth daily as needed for mild constipation.   famotidine (PEPCID) 20 MG tablet, Take 20 mg by mouth daily.   Fish Oil-Cholecalciferol (OMEGA-3 & VITAMIN D3 GUMMIES PO), Take 2,000 mg by mouth daily at 12 noon.   Fish Oil-Cholecalciferol (OMEGA-3 + VITAMIN D3 PO), Take 1 capsule by mouth once a week. Take 2000 mg once a week.   gabapentin (NEURONTIN) 300 MG capsule, TAKE 1 CAPSULE BY MOUTH THREE TIMES DAILY   Glucose Blood (BLOOD GLUCOSE TEST STRIPS) STRP, Inject 1 each as directed daily.   hydroxychloroquine (PLAQUENIL) 200 MG tablet, TAKE 1 TABLET BY MOUTH TWICE DAILY FOR RHEUMATOID ARTHRITIS   Insulin Pen Needle (BD PEN NEEDLE NANO U/F) 32G X 4 MM MISC, 1 each by Does not apply route at bedtime.   lidocaine (LIDODERM) 5 %, Place 1 patch onto the skin daily. Remove & Discard patch within 12  hours or as directed by MD   linaclotide (LINZESS) 290 MCG CAPS capsule, Take 1 capsule (290 mcg total) by mouth daily before breakfast.   Misc. Devices (ROLLATOR ULTRA-LIGHT) MISC, Use as directed.   ondansetron (ZOFRAN) 4 MG tablet, TAKE 1 TABLET BY MOUTH EVERY 8 HOURS AS NEEDED FOR NAUSEA OR VOMITING   pantoprazole (PROTONIX) 40 MG tablet, Take 1 tablet (40 mg total) by mouth daily.   potassium chloride (KLOR-CON) 10 MEQ tablet, Take 1 tablet by mouth twice daily   pyridOXINE (VITAMIN B6) 100 MG tablet, Take 100 mg by mouth daily.   RESTASIS 0.05 % ophthalmic emulsion, 1 drop 2 (two) times daily.   TURMERIC PO, Take 500 mg by mouth.   venlafaxine XR (EFFEXOR-XR) 37.5 MG 24 hr capsule, TAKE 1 CAPSULE DAILY WITH BREAKFAST.  Medical History:  Past Medical History:  Diagnosis Date   Allergy    Anemia    iron therapy for years as of 10/12; normal Hgb 08/2013   Anxiety    Arthritis    Cardiomyopathy ([place])    Chest pain 04/05/2011   cardiac eval, normal treadmill stress test, Dr. Tollie Eth   Chronic back pain    Constipation    Farsightedness    wears glasses, [facility] care [facility]   Gastrointestinal stromal tumor (GIST) ([place]) 06/2014   Dr. [name], [facility]   GERD (gastroesophageal reflux disease)    History of uterine fibroid  Hyperlipidemia    Hypertension    Paresthesia 09/2014   initially thought to be TIA, neurology consult in 12/2014 with other non TIA considerations.     Polyarthralgia    normal rheumatoid screen 01/2012   TIA (transient ischemic attack) 2015/16   Allergies:  Allergies  Allergen Reactions   Kiwi Extract Hives and Itching   Mucinex Dm [Dm-Guaifenesin Er] Nausea Only    Messes up her stomach. Pt report on 01/21/15   Peach [Prunus Persica] Itching    Peach peeling makes pt itch     Surgical History:  She  has a past surgical history that includes Lipoma excision; Knee arthroscopy (Left); Uterine fibroid surgery; gall stone  surgery; Colonoscopy (01/2014); Esophagogastroduodenoscopy (2013); Esophagogastroduodenoscopy (825749); EUS (N/A, 04/16/2014); Laparoscopic gastric resection (N/A, 06/09/2014); Upper gastrointestinal endoscopy; Lumbar laminectomy (N/A, 04/25/2019); and Dilatation & curettage/hysteroscopy with myosure (N/A, 07/14/2021). Family History:  Her family history includes Arthritis in her mother; Breast cancer (age of onset: 1) in her sister; Breast cancer (age of onset: 40) in her mother; Colon cancer (age of onset: 52) in her brother; Diabetes in her brother and maternal aunt; GER disease in her mother; Glaucoma in her mother; Healthy in her son; Heart disease in her maternal aunt, maternal grandfather, maternal grandmother, and maternal uncle; Hypertension in her mother, sister, and sister; Leukemia in her sister; Stroke in her father and maternal uncle.  REVIEW OF SYSTEMS  : All other systems reviewed and negative except where noted in the History of Present Illness.  PHYSICAL EXAM: LMP 02/23/2015 (LMP Unknown)  General:   Pleasant, well developed female in no acute distress Head:   Normocephalic and atraumatic. Eyes:  sclerae anicteric,conjunctive pink  Heart:   regular rate and rhythm Pulm:  Clear anteriorly; no wheezing, mild chest wall discomfort to palpation. Abdomen:   Soft, Obese AB, Sluggish bowel sounds. No tenderness . , No organomegaly appreciated. Rectal: Not evaluated Extremities:  Without edema. Msk: Symmetrical without gross deformities. Peripheral pulses intact.  Walks with cane. Neurologic:  Alert and  oriented x4;  No focal deficits.  Skin:   Dry and intact without significant lesions or rashes. Psychiatric:  Cooperative. Normal mood and affect.    Vladimir Crofts, PA-C 10:14 AM

## 2022-04-12 ENCOUNTER — Ambulatory Visit: Payer: Medicare HMO | Admitting: Physician Assistant

## 2022-04-16 ENCOUNTER — Other Ambulatory Visit: Payer: Self-pay | Admitting: Rheumatology

## 2022-04-16 ENCOUNTER — Other Ambulatory Visit: Payer: Self-pay | Admitting: Physician Assistant

## 2022-04-16 DIAGNOSIS — M0579 Rheumatoid arthritis with rheumatoid factor of multiple sites without organ or systems involvement: Secondary | ICD-10-CM

## 2022-04-16 DIAGNOSIS — K219 Gastro-esophageal reflux disease without esophagitis: Secondary | ICD-10-CM

## 2022-04-17 NOTE — Telephone Encounter (Signed)
Next Visit: 07/18/2022  Last Visit: 02/16/2022  Labs: 01/18/2022 CBC, CMP and ESR normal.   Eye exam: 10/18/2021 WNL    Current Dose per office note 02/16/2022: Plaquenil 200 mg 1 tablet by mouth twice daily.   LU:[id number] arthritis involving multiple sites with positive rheumatoid factor   Last Fill: 01/16/2022  Okay to refill Plaquenil?

## 2022-05-04 ENCOUNTER — Encounter: Payer: Self-pay | Admitting: Orthopaedic Surgery

## 2022-05-04 ENCOUNTER — Ambulatory Visit (INDEPENDENT_AMBULATORY_CARE_PROVIDER_SITE_OTHER): Payer: Medicare HMO | Admitting: Orthopaedic Surgery

## 2022-05-04 ENCOUNTER — Telehealth: Payer: Self-pay | Admitting: Orthopaedic Surgery

## 2022-05-04 DIAGNOSIS — M1711 Unilateral primary osteoarthritis, right knee: Secondary | ICD-10-CM

## 2022-05-04 DIAGNOSIS — G8929 Other chronic pain: Secondary | ICD-10-CM

## 2022-05-04 DIAGNOSIS — M25561 Pain in right knee: Secondary | ICD-10-CM

## 2022-05-04 NOTE — Progress Notes (Signed)
The patient is a 62 year old female referred to me from Dr. Durward Fortes to discuss knee replacement surgery for her right knee.  She has well-documented severe end-stage arthritis with bone-on-bone wear on x-rays and an MRI also showing severe arthritis with full-thickness cartilage loss in all 3 compartments of the knee.  There is also subchondral reactive edema.  She does have daily knee pain.  She does ambulate with a cane.  She is not on blood thinning medication and is only prediabetic.  She does have a history of congestive heart failure and does see the cardiologist regularly so she understands she would need clearance for this type of surgery.  Her right knee does have varus malalignment and significant pain throughout the arc of motion of her knee.  There is a lot of grinding and crepitation when I bend her knee back-and-forth.  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  I then showed her knee replacement model and explained in detail what this type of surgery involves.  We discussed the risk and benefits of the surgery in detail and what to expect from an intraoperative and postoperative course.  I also reviewed all of her notes and medications within epic as well as past medical history and surgical history.  At this point I have given her my card and told her that I am happy to see her at any time and schedule the surgery when she does have cardiac clearance for this surgery if she decides to have it done.  All questions and concerns were answered and addressed.  She is welcome to also call for any other questions if she has some that she would like me to answer.

## 2022-05-17 NOTE — Progress Notes (Deleted)
05/17/2022 SHAQUEEN PELLE AT:[id number] September 16, 1960  Referring provider: Roselee Nova, MD Primary GI doctor:Dr. Tarri Glenn  (Dr. Ardis Hughs)  ASSESSMENT AND PLAN:   Gastroesophageal reflux disease without esophagitis Has been on NSAIDs 1 episode of abdominal discomfort with nausea and vomiting, intermittent dysphagia that comes and goes. Has only been on Pepcid. Instructed to stop NSAIDs, given lifestyle changes, will add on Protonix once daily, and schedule for barium swallow since patient is having chest discomfort and getting evaluated with cardiology with echocardiogram which is pending. We will schedule follow-up, she continues to have symptoms we will plan for endoscopy.  History of gastrointestinal stromal tumor (GIST) and history of diverticulosis Status post resection 2016 Some worsening reflux, nausea, mild abdominal discomfort We will plan on repeating CT abdomen pelvis to evaluate previous site of resection, and diverticulitis.  Chest pain with history of chronic HFrEF Has been having some chest discomfort, normal myocardial perfusion study 2021, pending echocardiogram and visit with cardiologist. Last EF 45 to 50% 2021 question chest wall discomfort as patient does have some tenderness to palpation.  Could also be potentially esophagitis/reflux, following up with cardiology and getting repeat echocardiogram which is pending.  Chronic idiopathic constipation - Increase fiber/ water intake, decrease caffeine, increase activity level. We will increase Linzess from 145 to 290.    Patient Care Team: Roselee Nova, MD as PCP - General (Family Medicine)  HISTORY OF PRESENT ILLNESS: 62 y.o. female with a past medical history of hypertension, chronic heart failure with reduced ejection fraction, 07/2019 normal myocardial perfusion study, echocardiogram 2021 showed ejection fraction 45-50% , rheumatoid arthritis, vitamin D deficiency, history of TIA, history of  gastrointestinal stromal tumor, constipation, family history of colon cancer in brother and others listed below presents for evaluation of GERD.   2012 abdominal ultrasound cholelithiasis 02/2012 EGD Dr. Benson Norway for gastritis biopsies not available 01/2014 Colonoscopy Dr. Ardis Hughs found diverticulosis but no polyps, recommended recall colonoscopy at 5 year interval.  2016 CT abdomen pelvis with incidental gastric wall mass 04/2014 EUS with Dr. Ardis Hughs 0.9 x 2.5 GIST anterior wall of stomach refer to surgeon to consider resection and also cholecystectomy at same time status post lap resection 06/2014 3 cm  with low mitotic activity and clear margins Dr. Rosendo Gros 02/11/2019 colonoscopy Dr. Ardis Hughs for screening with family history of first-degree relative with colorectal cancer before age 64 showed diverticulosis otherwise unremarkable recall 5 years  02/23/22 OV with myself for GERD and abdominal pain, instructed stop NSAIDs,omeprazole once daily with increase in Vesper from 145-290. 02/27/2022 CT abdomen pelvis with contrast due to nonlocalized abdominal pain, nausea and vomiting no acute findings, status post procedural resection Carlean Purl with no findings to suggest local recurrent disease 03/01/2022 unremarkable barium swallow, no mass or stricture, no dysmotility, no reflux  Patient is here for follow-up with myself for abdominal discomfort, reflux, chronic idiopathic constipation. Echocardiogram pending 07/11/2022    She  reports that she has never smoked. She has been exposed to tobacco smoke. She has never used smokeless tobacco. She reports that she does not drink alcohol and does not use drugs.  Current Medications:   Current Outpatient Medications (Endocrine & Metabolic):    FARXIGA 10 MG TABS tablet, TAKE 1 TABLET BY MOUTH ONCE DAILY BEFORE BREAKFAST  Current Outpatient Medications (Cardiovascular):    carvedilol (COREG) 6.25 MG tablet, Take 1 tablet (6.25 mg total) by mouth 2 (two) times  daily.   ENTRESTO 49-51 MG, Take 1 tablet by mouth 2 (  two) times daily.   rosuvastatin (CRESTOR) 20 MG tablet, Take 1 tablet (20 mg total) by mouth daily.   spironolactone (ALDACTONE) 25 MG tablet, Take 25 mg by mouth daily.  Current Outpatient Medications (Respiratory):    fluticasone (FLONASE) 50 MCG/ACT nasal spray, Place 2 sprays into both nostrils in the morning and at bedtime.  Current Outpatient Medications (Analgesics):    EQ ASPIRIN ADULT LOW DOSE 81 MG EC tablet, Take 1 tablet by mouth once daily   etodolac (LODINE) 400 MG tablet, Take 400 mg by mouth 2 (two) times daily.   Current Outpatient Medications (Other):    blood glucose meter kit and supplies KIT, Test once daily-[id number]   cyclobenzaprine (FLEXERIL) 10 MG tablet, Take 1 tablet (10 mg total) by mouth 2 (two) times daily as needed for muscle spasms.   diclofenac Sodium (VOLTAREN) 1 % GEL, APPLY 2 GRAMS TOPICALLY 4 TIMES DAILY   docusate sodium (COLACE) 100 MG capsule, Take 100 mg by mouth daily as needed for mild constipation.   famotidine (PEPCID) 20 MG tablet, Take 20 mg by mouth daily.   Fish Oil-Cholecalciferol (OMEGA-3 & VITAMIN D3 GUMMIES PO), Take 2,000 mg by mouth daily at 12 noon.   Fish Oil-Cholecalciferol (OMEGA-3 + VITAMIN D3 PO), Take 1 capsule by mouth once a week. Take 2000 mg once a week.   gabapentin (NEURONTIN) 300 MG capsule, TAKE 1 CAPSULE BY MOUTH THREE TIMES DAILY   Glucose Blood (BLOOD GLUCOSE TEST STRIPS) STRP, Inject 1 each as directed daily.   hydroxychloroquine (PLAQUENIL) 200 MG tablet, TAKE 1 TABLET BY MOUTH TWICE DAILY FOR RHEUMATOID ARTHRITIS   Insulin Pen Needle (BD PEN NEEDLE NANO U/F) 32G X 4 MM MISC, 1 each by Does not apply route at bedtime.   lidocaine (LIDODERM) 5 %, Place 1 patch onto the skin daily. Remove & Discard patch within 12 hours or as directed by MD   linaclotide (LINZESS) 290 MCG CAPS capsule, Take 1 capsule (290 mcg total) by mouth daily before breakfast.   Misc.  Devices (ROLLATOR ULTRA-LIGHT) MISC, Use as directed.   ondansetron (ZOFRAN) 4 MG tablet, TAKE 1 TABLET BY MOUTH EVERY 8 HOURS AS NEEDED FOR NAUSEA OR VOMITING   pantoprazole (PROTONIX) 40 MG tablet, Take 1 tablet by mouth once daily   potassium chloride (KLOR-CON) 10 MEQ tablet, Take 1 tablet by mouth twice daily   pyridOXINE (VITAMIN B6) 100 MG tablet, Take 100 mg by mouth daily.   RESTASIS 0.05 % ophthalmic emulsion, 1 drop 2 (two) times daily.   TURMERIC PO, Take 500 mg by mouth.   venlafaxine XR (EFFEXOR-XR) 37.5 MG 24 hr capsule, TAKE 1 CAPSULE DAILY WITH BREAKFAST.  Medical History:  Past Medical History:  Diagnosis Date   Allergy    Anemia    iron therapy for years as of 10/12; normal Hgb 08/2013   Anxiety    Arthritis    Cardiomyopathy ([place])    Chest pain 04/05/2011   cardiac eval, normal treadmill stress test, Dr. Tollie Eth   Chronic back pain    Constipation    Farsightedness    wears glasses, [facility] care center   Gastrointestinal stromal tumor (GIST) ([place]) 06/2014   Dr. [name], [facility]   GERD (gastroesophageal reflux disease)    History of uterine fibroid    Hyperlipidemia    Hypertension    Paresthesia 09/2014   initially thought to be TIA, neurology consult in 12/2014 with other non TIA considerations.  Polyarthralgia    normal rheumatoid screen 01/2012   TIA (transient ischemic attack) 2015/16   Allergies:  Allergies  Allergen Reactions   Kiwi Extract Hives and Itching   Mucinex Dm [Dm-Guaifenesin Er] Nausea Only    Messes up her stomach. Pt report on 01/21/15   Peach [Prunus Persica] Itching    Peach peeling makes pt itch     Surgical History:  She  has a past surgical history that includes Lipoma excision; Knee arthroscopy (Left); Uterine fibroid surgery; gall stone surgery; Colonoscopy (01/2014); Esophagogastroduodenoscopy (2013); Esophagogastroduodenoscopy [id number]); EUS (N/A, 04/16/2014); Laparoscopic gastric resection  (N/A, 06/09/2014); Upper gastrointestinal endoscopy; Lumbar laminectomy (N/A, 04/25/2019); and Dilatation & curettage/hysteroscopy with myosure (N/A, 07/14/2021). Family History:  Her family history includes Arthritis in her mother; Breast cancer (age of onset: 25) in her sister; Breast cancer (age of onset: 90) in her mother; Colon cancer (age of onset: 87) in her brother; Diabetes in her brother and maternal aunt; GER disease in her mother; Glaucoma in her mother; Healthy in her son; Heart disease in her maternal aunt, maternal grandfather, maternal grandmother, and maternal uncle; Hypertension in her mother, sister, and sister; Leukemia in her sister; Stroke in her father and maternal uncle.  REVIEW OF SYSTEMS  : All other systems reviewed and negative except where noted in the History of Present Illness.  PHYSICAL EXAM: LMP 02/23/2015 (LMP Unknown)  General:   Pleasant, well developed female in no acute distress Head:   Normocephalic and atraumatic. Eyes:  sclerae anicteric,conjunctive pink  Heart:   regular rate and rhythm Pulm:  Clear anteriorly; no wheezing, mild chest wall discomfort to palpation. Abdomen:   Soft, Obese AB, Sluggish bowel sounds. No tenderness . , No organomegaly appreciated. Rectal: Not evaluated Extremities:  Without edema. Msk: Symmetrical without gross deformities. Peripheral pulses intact.  Walks with cane. Neurologic:  Alert and  oriented x4;  No focal deficits.  Skin:   Dry and intact without significant lesions or rashes. Psychiatric:  Cooperative. Normal mood and affect.    Vladimir Crofts, PA-C 12:20 PM

## 2022-05-18 ENCOUNTER — Ambulatory Visit: Payer: Medicare HMO | Admitting: Physician Assistant

## 2022-06-22 ENCOUNTER — Other Ambulatory Visit: Payer: Self-pay | Admitting: Cardiology

## 2022-06-22 ENCOUNTER — Encounter: Payer: Self-pay | Admitting: Family Medicine

## 2022-06-22 DIAGNOSIS — Z0000 Encounter for general adult medical examination without abnormal findings: Secondary | ICD-10-CM

## 2022-06-22 DIAGNOSIS — I429 Cardiomyopathy, unspecified: Secondary | ICD-10-CM

## 2022-06-22 DIAGNOSIS — I5022 Chronic systolic (congestive) heart failure: Secondary | ICD-10-CM

## 2022-06-29 ENCOUNTER — Other Ambulatory Visit: Payer: Self-pay | Admitting: Cardiology

## 2022-06-30 LAB — BASIC METABOLIC PANEL
BUN/Creatinine Ratio: 15 (ref 12–28)
BUN: 12 mg/dL (ref 8–27)
CO2: 25 mmol/L (ref 20–29)
Calcium: 9.4 mg/dL (ref 8.7–10.3)
Chloride: 105 mmol/L (ref 96–106)
Creatinine, Ser: 0.78 mg/dL (ref 0.57–1.00)
Glucose: 81 mg/dL (ref 70–99)
Potassium: 4.7 mmol/L (ref 3.5–5.2)
Sodium: 144 mmol/L (ref 134–144)
eGFR: 87 mL/min/{1.73_m2} (ref >59)

## 2022-06-30 LAB — MAGNESIUM: Magnesium: 1.9 mg/dL (ref 1.6–2.3)

## 2022-07-04 NOTE — Progress Notes (Unsigned)
Office Visit Note  [name]             Date of Birth: [birth year]           MRN: [id number]             PCP: Ellyn Hack, MD Referring: Ellyn Hack, MD Visit Date: 07/18/2022 Occupation: @GUAROCC @  Subjective:  Pain in multiple joints   History of Present Illness: Lisa Briggs is a 62 y.o. female with history of seropositive rheumatoid arthritis and osteoarthritis.  Patient is taking Plaquenil 200 mg 1 tablet by mouth twice daily.  She is tolerating Plaquenil without any side effects and has not missed any doses recently.  Patient reports that she continues to have intermittent pain, stiffness, and swelling in both hands, both wrist, both knee joints.  Patient was curious if she request more aggressive treatment for rheumatoid arthritis management. She has been wearing a right knee brace for support and is using a cane to assist with ambulation.  She does not want to proceed with knee joint injections or knee replacement in the future.   Activities of Daily Living:  Patient reports morning stiffness for   minutes.   Patient Reports nocturnal pain.  Difficulty dressing/grooming: Denies Difficulty climbing stairs: Reports Difficulty getting out of chair: Denies Difficulty using hands for taps, buttons, cutlery, and/or writing: Reports  Review of Systems  Constitutional:  Positive for fatigue.  HENT:  Positive for mouth dryness. Negative for mouth sores and nose dryness.   Eyes:  Positive for dryness. Negative for pain and visual disturbance.  Respiratory:  Negative for cough, hemoptysis, shortness of breath and difficulty breathing.   Cardiovascular:  Positive for swelling in legs/feet. Negative for chest pain, palpitations and hypertension.  Gastrointestinal:  Positive for constipation. Negative for blood in stool and diarrhea.  Endocrine: Negative for increased urination.  Genitourinary:  Negative for painful urination.  Musculoskeletal:  Positive for  joint pain, joint pain, joint swelling and morning stiffness. Negative for myalgias, muscle weakness, muscle tenderness and myalgias.  Skin:  Negative for color change, pallor, rash, hair loss, nodules/bumps, skin tightness, ulcers and sensitivity to sunlight.  Allergic/Immunologic: Negative for susceptible to infections.  Neurological:  Negative for dizziness, numbness, headaches and weakness.  Hematological:  Negative for swollen glands.  Psychiatric/Behavioral:  Negative for depressed mood and sleep disturbance. The patient is not nervous/anxious.     PMFS History:  Patient Active Problem List   Diagnosis Date Noted   Unilateral primary osteoarthritis, right knee 05/04/2022   Bilateral ankle pain 09/15/2021   Chronic HFrEF (heart failure with reduced ejection fraction) 07/29/2021   Advance directive discussed with patient 01/27/2021   Medicare annual wellness visit, subsequent 01/27/2021   Needs flu shot 01/27/2021   At moderate risk for fall 01/27/2021   Prediabetes 02/19/2020   Menopause 11/25/2019   Cardiomyopathy 11/19/2019   Rheumatoid arthritis involving multiple sites with positive rheumatoid factor 11/19/2019   Primary osteoarthritis involving multiple joints 11/19/2019   Greater trochanteric bursitis, right 11/19/2019   Decreased activities of daily living (ADL) 11/19/2019   Gait disturbance 11/19/2019   Use of cane as ambulatory aid 11/19/2019   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    Class: Chronic   Spinal stenosis of lumbar region 04/25/2019    Class: Chronic   Status post lumbar laminectomy 04/25/2019   Chronic pain of right knee 09/13/2018   Chronic pain of left knee 07/08/2018   Chronic hip  pain 07/08/2018   Edema 07/08/2018   Need for shingles vaccine 12/05/2017   Primary osteoarthritis of both hands 11/23/2017   Primary osteoarthritis of both knees 11/23/2017   Primary osteoarthritis of both feet 11/23/2017   DDD (degenerative disc  disease), lumbar 11/23/2017   Vaccine counseling 08/13/2017   Sensitive skin 01/23/2017   Need for influenza vaccination 01/23/2017   History of gastrointestinal stromal tumor (GIST) 04/20/2016   History of TIA (transient ischemic attack) 04/20/2016   Screening for breast cancer 04/20/2016   Estrogen deficiency 04/20/2016   Impaired fasting blood sugar 04/20/2016   Constipation 04/20/2016   History of fall 04/20/2016   Chronic radicular lumbar pain 01/27/2016   Encounter for health maintenance examination in adult 03/22/2015   Paresthesia 03/22/2015   Cognitive decline 03/22/2015   Screening for cervical cancer 03/22/2015   Vitamin D deficiency 03/22/2015   History of uterine leiomyoma 03/22/2015   Gastroesophageal reflux disease without esophagitis 03/02/2014   Chronic nausea 03/02/2014   Rhinitis, allergic 03/02/2014   Essential hypertension 03/02/2014   Hyperlipidemia 03/02/2014   Obesity with serious comorbidity 01/16/2012    Past Medical History:  Diagnosis Date   Allergy    Anemia    iron therapy for years as of 10/12; normal Hgb 08/2013   Anxiety    Arthritis    Cardiomyopathy    Chest pain 04/05/2011   cardiac eval, normal treadmill stress test, Dr. 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 Axel Filler, [facility]   GERD (gastroesophageal reflux disease)    History of uterine fibroid    Hyperlipidemia    Hypertension    Paresthesia 09/2014   initially thought to be TIA, neurology consult in 12/2014 with other non TIA considerations.     Polyarthralgia    normal rheumatoid screen 01/2012   TIA (transient ischemic attack) 2015/16    Family History  Problem Relation Age of Onset   Hypertension Mother    Arthritis Mother    GER disease Mother    Glaucoma Mother    Breast cancer Mother 69   Stroke Father    Breast cancer  Sister 73   Hypertension Sister    Leukemia Sister    Hypertension Sister    Colon cancer Brother 30   Diabetes Brother    Diabetes Maternal Aunt    Heart disease Maternal Aunt    Heart disease Maternal Uncle    Stroke Maternal Uncle    Heart disease Maternal Grandmother    Heart disease Maternal Grandfather    Healthy Son    Colon polyps Neg Hx    Esophageal cancer Neg Hx    Stomach cancer Neg Hx    Rectal cancer Neg Hx    Past Surgical History:  Procedure Laterality Date   COLONOSCOPY  01/2014   diverticulosis, othwerise normal - Dr. Rob Bunting   DILATATION & CURETTAGE/HYSTEROSCOPY WITH MYOSURE N/A 07/14/2021   Procedure: DILATATION & CURETTAGE/HYSTEROSCOPY WITH MYOSURE Reach;  Surgeon: Shea Evans, MD;  Location: [facility];  Service: Gynecology;  Laterality: N/A;   ESOPHAGOGASTRODUODENOSCOPY  2013   Dr. Elnoria Howard, gastritis   ESOPHAGOGASTRODUODENOSCOPY  [id number]   EUS N/A 04/16/2014   Procedure: UPPER ENDOSCOPIC ULTRASOUND (EUS) LINEAR;  Surgeon: Rachael Fee, MD;  Location: [facility];  Service: Endoscopy;  Laterality: N/A;   gall stone surgery  KNEE ARTHROSCOPY Left    LAPAROSCOPIC GASTRIC RESECTION N/A 06/09/2014   Procedure: LAPAROSCOPIC GASTRIC MASS RESECTION;  Surgeon: Axel Filler, MD;  Location: [facility];  Service: General;  Laterality: N/A;   LIPOMA EXCISION     forehead   LUMBAR LAMINECTOMY N/A 04/25/2019   Procedure: LEFT L2-3 MICRODISCECTOMY, BILATERAL L5-S1 PARTIAL HEMILAMINECTOMY;  Surgeon: Kerrin Champagne, MD;  Location: [facility];  Service: Orthopedics;  Laterality: N/A;   UPPER GASTROINTESTINAL ENDOSCOPY     UTERINE FIBROID SURGERY     Social History   Social History Narrative   Married, has 1 son in [place] and some grandchildren.  Location manager.  Active on job.  Does stretching and exercises daily as per physical therapy.  Works 12 hours daily.     Immunization History  Administered Date(s) Administered   Influenza,inj,Quad PF,6+  Mos 11/24/2013, 01/18/2015, 01/23/2017, 12/05/2017, 12/17/2018, 11/19/2019, 01/27/2021   PFIZER Comirnaty(Gray Top)Covid-19 Tri-Sucrose Vaccine 07/12/2020   PFIZER(Purple Top)SARS-COV-2 Vaccination 06/16/2019, 07/08/2019, 01/15/2020   PNEUMOCOCCAL CONJUGATE-20 07/28/2021   Pneumococcal Polysaccharide-23 05/21/2015   Tdap 01/17/2011, 06/25/2021   Zoster Recombinat (Shingrix) 10/05/2017, 12/07/2017     Objective: Vital Signs: BP 115/73 (BP Location: Right Wrist, Patient Position: Sitting, Cuff Size: Normal)   Pulse 66   Resp 17   Ht  (1.753 m)   Wt 240 lb 6.4 oz (109 kg)   LMP 02/23/2015 (LMP Unknown)   BMI 35.50 kg/m    Physical Exam Vitals and nursing note reviewed.  Constitutional:      Appearance: She is well-developed.  HENT:     Head: Normocephalic and atraumatic.  Eyes:     Conjunctiva/sclera: Conjunctivae normal.  Cardiovascular:     Rate and Rhythm: Normal rate and regular rhythm.     Heart sounds: Normal heart sounds.  Pulmonary:     Effort: Pulmonary effort is normal.     Breath sounds: Normal breath sounds.  Abdominal:     General: Bowel sounds are normal.     Palpations: Abdomen is soft.  Musculoskeletal:     Cervical back: Normal range of motion.  Lymphadenopathy:     Cervical: No cervical adenopathy.  Skin:    General: Skin is warm and dry.     Capillary Refill: Capillary refill takes less than 2 seconds.  Neurological:     Mental Status: She is alert and oriented to person, place, and time.  Psychiatric:        Behavior: Behavior normal.      Musculoskeletal Exam: C-spine has limited range of motion without rotation.  Shoulder joints have good range of motion with no discomfort.  Elbow joints have good range of motion with no tenderness.  Wrist joints have good range of motion with some tenderness over both wrist joints.  PIP and DIP thickening consistent with osteoarthritis of both hands, right hand greater than left.  Complete fist formation  bilaterally.  Hip joints have good range of motion with no groin pain.  Painful range of motion of both knee joints, right greater than left.  Warmth of the right knee noted.  Ankle joints have good range of motion with no tenderness.  Some pedal edema noted bilaterally.  CDAI Exam: CDAI Score: -- Patient Global: 5 mm; Provider Global: 2 mm Swollen: --; Tender: -- Joint Exam 07/18/2022   No joint exam has been documented for this visit   There is currently no information documented on the homunculus. Go to the Rheumatology activity and complete the homunculus joint exam.  Investigation: No additional findings.  Imaging: PCV ECHOCARDIOGRAM COMPLETE  Result Date: 07/11/2022 Echocardiogram 07/11/2022: Mildly depressed LV systolic function with visual EF 45-50%. Left ventricle cavity is normal in size. Mild concentric hypertrophy of the left ventricle. Hypokinetic global wall motion. Doppler evidence of grade I (impaired) diastolic dysfunction, normal LAP. Calculated EF 49%. Structurally normal mitral valve.  Mild (Grade I) mitral regurgitation. Structurally normal tricuspid valve.  Mild tricuspid regurgitation. No evidence of pulmonary hypertension. Pericardium is normal. Trace pericardial effusion. No significant change compared to 06/2019.    Recent Labs: Lab Results  Component Value Date   WBC 6.0 01/18/2022   HGB 12.0 01/18/2022   PLT 308 01/18/2022   NA 144 06/29/2022   K 4.7 06/29/2022   CL 105 06/29/2022   CO2 25 06/29/2022   GLUCOSE 81 06/29/2022   BUN 12 06/29/2022   CREATININE 0.78 06/29/2022   BILITOT 0.3 01/18/2022   ALKPHOS 103 08/28/2021   AST 22 01/18/2022   ALT 17 01/18/2022   PROT 6.7 01/18/2022   ALBUMIN 3.7 08/28/2021   CALCIUM 9.4 06/29/2022   GFRAA 115 05/11/2020    Speciality Comments: PLQ Eye Exam: 10/18/2021 WNL @ [facility] Follow up in 1 year  Procedures:  No procedures performed Allergies: Kiwi extract, Mucinex dm [dm-guaifenesin er], and Peach  [prunus persica]    Assessment / Plan:     Visit Diagnoses: Rheumatoid arthritis involving multiple sites with positive rheumatoid factor - +RF, +CCP: She has no synovitis on examination today.  She has been experiencing increased discomfort in both hands and both knee joints.  At times she notices inflammation in her hands and wrist joints.  She has been taking Plaquenil 200 mg 1 tablet by mouth twice daily.  She is tolerating Plaquenil without any side effects.  She does not feel that her pain level has been adequately controlled.  An ultrasound of both hands will be scheduled to assess for synovitis.  A referral to pain management will also be placed.  She will remain on Plaquenil as prescribed for now.  A refill of Plaquenil sent to the pharmacy today.  She will follow-up in the office in 5 months or sooner if needed.- Plan: hydroxychloroquine (PLAQUENIL) 200 MG tablet  High risk medication use - Plaquenil 200 mg 1 tablet by mouth twice daily. CBC and CMP were drawn on 01/18/2022.  Orders for CBC and CMP were released today. PLQ Eye Exam: 10/18/2021 WNL @ [facility] Follow up in 1 year.   - Plan: CBC with Differential/Platelet, COMPLETE METABOLIC PANEL WITH GFR  Primary osteoarthritis of both hands: She has been experiencing increased pain and stiffness in both hands and both wrist joints intermittently.  On examination today she had no synovitis.  Plan to schedule an ultrasound of both hands to assess for synovitis.  Referral to pain management was also placed today.  Trochanteric bursitis of both hips: She experiences intermittent discomfort in both hips secondary to trochanter bursitis.  Primary osteoarthritis of both knees: She has chronic pain in both knee joints, right greater than left.  She has been using a left knee brace for support and using a cane to assist with ambulation.  On examination she has warmth of the right knee.  She was previously under the care of Dr. Cleophas Dunker who has  since retired.  She does not want to proceed with knee and joint injections or knee replacement in the future.  A referral to pain management was placed today for further evaluation  and management.  Instability of right knee joint: She has been using her right knee brace and a cane to assist with ambulation.  Primary osteoarthritis of both feet: She is wearing proper fitting shoes.  Good range of motion of both ankle joints with pedal edema noted bilaterally.  Closed avulsion fracture of left ankle with routine healing, subsequent encounter  DDD (degenerative disc disease), lumbar: She experiences intermittent discomfort in her lower back.  No symptoms of radiculopathy at this time.  Using a cane to assist with ambulation. A referral to pain management was placed today.  Osteopenia of left hip - DEXA updated on 08/05/21: The BMD measured at Femur Neck Left is 0.868 g/cm2 with a T-score of -1.2.  Calcium rich diet and exercise was emphasized. Due to update DEXA in May 2025.  Other medical conditions are listed as follows:  Essential hypertension: Blood pressure was 115/73 today in the office.  History of hyperlipidemia  History of TIA (transient ischemic attack)  Vitamin D deficiency  Gastroesophageal reflux disease without esophagitis  Ganglion cyst of volar aspect of left wrist   Orders: Orders Placed This Encounter  Procedures   CBC with Differential/Platelet   COMPLETE METABOLIC PANEL WITH GFR   Meds ordered this encounter  Medications   hydroxychloroquine (PLAQUENIL) 200 MG tablet    Sig: Take 1 tablet by mouth twice daily.    Dispense:  180 tablet    Refill:  0   lidocaine (LIDODERM) 5 %    Sig: Place 1 patch onto the skin daily. Remove & Discard patch within 12 hours or as directed by MD    Dispense:  15 patch    Refill:  0      Follow-Up Instructions: Return in 5 months (on 12/18/2022) for Rheumatoid arthritis, Osteoarthritis.   Gearldine Bienenstock, PA-C  Note -  This record has been created using Dragon software.  Chart creation errors have been sought, but may not always  have been located. Such creation errors do not reflect on  the standard of medical care.

## 2022-07-04 NOTE — Progress Notes (Deleted)
07/04/2022 JITZEL PROENZA [id number] 1960/11/17  Referring provider: Roselee Nova, MD Primary GI doctor:Dr. Tarri Glenn  (Dr. Ardis Hughs)  ASSESSMENT AND PLAN:   Gastroesophageal reflux disease with intermittent dysphagia Unremarkable CT abdomen and pelvis Normal barium swallow  Has been on NSAIDs 1 episode of abdominal discomfort with nausea and vomiting, intermittent dysphagia that comes and goes. Has only been on Pepcid. Instructed to stop NSAIDs, given lifestyle changes, will add on Protonix once daily, and schedule for barium swallow since patient is having chest discomfort and getting evaluated with cardiology with echocardiogram which is pending. We will schedule follow-up, she continues to have symptoms we will plan for endoscopy.  History of gastrointestinal stromal tumor (GIST) and history of diverticulosis Status post resection 2016 Some worsening reflux, nausea, mild abdominal discomfort We will plan on repeating CT abdomen pelvis to evaluate previous site of resection, and diverticulitis.  Chest pain with history of chronic HFrEF Has been having some chest discomfort, normal myocardial perfusion study 2021, pending echocardiogram and visit with cardiologist. Last EF 45 to 50% 2021 question chest wall discomfort as patient does have some tenderness to palpation.  Could also be potentially esophagitis/reflux, following up with cardiology and getting repeat echocardiogram which is pending.  Chronic idiopathic constipation - Increase fiber/ water intake, decrease caffeine, increase activity level. We will increase Linzess from 145 to 290.    Patient Care Team: Roselee Nova, MD as PCP - General (Family Medicine)  HISTORY OF PRESENT ILLNESS: 62 y.o. female with a past medical history of hypertension, chronic heart failure with reduced ejection fraction, 07/2019 normal myocardial perfusion study, echo 01/02/2022, rheumatoid arthritis, vitamin D deficiency, history  of TIA, history of gastrointestinal stromal tumor, constipation, family history of colon cancer in brother and others listed below presents for evaluation of GERD.   2012 abdominal ultrasound cholelithiasis 02/2012 EGD Dr. Benson Norway for gastritis biopsies not available 01/2014 Colonoscopy Dr. Ardis Hughs found diverticulosis but no polyps, recommended recall colonoscopy at 5 year interval.  2016 CT abdomen pelvis with incidental gastric wall mass 04/2014 EUS with Dr. Ardis Hughs 0.9 x 2.5 GIST anterior wall of stomach refer to surgeon to consider resection and also cholecystectomy at same time status post lap resection 06/2014 3 cm  with low mitotic activity and clear margins Dr. Rosendo Gros 02/11/2019 colonoscopy Dr. Ardis Hughs for screening with family history of first-degree relative with colorectal cancer before age 57 showed diverticulosis otherwise unremarkable recall 5 years 02/16/2022 office visit with myself for worsening reflux, chest discomfort and dysphagia, patient was also getting worked up with cardiology with echocardiogram  which appears to have been completed 01/2022 but I do not see the results.   Started on Protonix once daily. 02/27/2022 CT abdomen pelvis with contrast showed no acute findings, status postsurgical resection August no locally recurrent disease 03/01/2022 barium swallow unremarkable 01/18/2022 no anemia, no leukocytosis, normal kidney and liver.   Patient was difficult historian.  She states last year when her mom was passing, she was having vaginal bleeding. S/p Surgery with GYN.  She states she has been having chills, she had one day of nausea with vomiting, no AB pain. She was having loose stool at the time and has had constipation.  She has BM every other day, will take miralax occ with linzess 145 mcg daily.  She has GERD, has been getting worse for 1-2 months.   She has had nocturnal symptoms with reflux into her throat and nose.  She has had some trouble  swallowing and throat  pain, this has improved, it comes and goes. Comes and goes.  She has had chest pain and went down her right arm neck, not worse with exertion. Felt just with talking had SOB and some hoarseness in her throat.  Mom passed and has had a lot of stress.  Went to see cardiology, normal stress test 07/2019, pending echo.  She is on etodolac only takes as needed, only took like 5 days out of Oct, none this   She  reports that she has never smoked. She has been exposed to tobacco smoke. She has never used smokeless tobacco. She reports that she does not drink alcohol and does not use drugs.  Current Medications:   Current Outpatient Medications (Endocrine & Metabolic):    dapagliflozin propanediol (FARXIGA) 10 MG TABS tablet, TAKE 1 TABLET BY MOUTH ONCE DAILY BEFORE BREAKFAST  Current Outpatient Medications (Cardiovascular):    carvedilol (COREG) 6.25 MG tablet, Take 1 tablet (6.25 mg total) by mouth 2 (two) times daily.   ENTRESTO 49-51 MG, Take 1 tablet by mouth 2 (two) times daily.   rosuvastatin (CRESTOR) 20 MG tablet, Take 1 tablet (20 mg total) by mouth daily.   spironolactone (ALDACTONE) 25 MG tablet, Take 25 mg by mouth daily.  Current Outpatient Medications (Respiratory):    fluticasone (FLONASE) 50 MCG/ACT nasal spray, Place 2 sprays into both nostrils in the morning and at bedtime.  Current Outpatient Medications (Analgesics):    EQ ASPIRIN ADULT LOW DOSE 81 MG EC tablet, Take 1 tablet by mouth once daily   etodolac (LODINE) 400 MG tablet, Take 400 mg by mouth 2 (two) times daily.   Current Outpatient Medications (Other):    blood glucose meter kit and supplies KIT, Test once daily-DX-E11.69   cyclobenzaprine (FLEXERIL) 10 MG tablet, Take 1 tablet (10 mg total) by mouth 2 (two) times daily as needed for muscle spasms.   diclofenac Sodium (VOLTAREN) 1 % GEL, APPLY 2 GRAMS TOPICALLY 4 TIMES DAILY   docusate sodium (COLACE) 100 MG capsule, Take 100 mg by mouth daily as needed for mild  constipation.   famotidine (PEPCID) 20 MG tablet, Take 20 mg by mouth daily.   Fish Oil-Cholecalciferol (OMEGA-3 & VITAMIN D3 GUMMIES PO), Take 2,000 mg by mouth daily at 12 noon.   Fish Oil-Cholecalciferol (OMEGA-3 + VITAMIN D3 PO), Take 1 capsule by mouth once a week. Take 2000 mg once a week.   gabapentin (NEURONTIN) 300 MG capsule, TAKE 1 CAPSULE BY MOUTH THREE TIMES DAILY   Glucose Blood (BLOOD GLUCOSE TEST STRIPS) STRP, Inject 1 each as directed daily.   hydroxychloroquine (PLAQUENIL) 200 MG tablet, TAKE 1 TABLET BY MOUTH TWICE DAILY FOR RHEUMATOID ARTHRITIS   Insulin Pen Needle (BD PEN NEEDLE NANO U/F) 32G X 4 MM MISC, 1 each by Does not apply route at bedtime.   lidocaine (LIDODERM) 5 %, Place 1 patch onto the skin daily. Remove & Discard patch within 12 hours or as directed by MD   linaclotide (LINZESS) 290 MCG CAPS capsule, Take 1 capsule (290 mcg total) by mouth daily before breakfast.   Misc. Devices (ROLLATOR ULTRA-LIGHT) MISC, Use as directed.   ondansetron (ZOFRAN) 4 MG tablet, TAKE 1 TABLET BY MOUTH EVERY 8 HOURS AS NEEDED FOR NAUSEA OR VOMITING   pantoprazole (PROTONIX) 40 MG tablet, Take 1 tablet by mouth once daily   potassium chloride (KLOR-CON) 10 MEQ tablet, Take 1 tablet by mouth twice daily   pyridOXINE (VITAMIN B6) 100 MG tablet,  Take 100 mg by mouth daily.   RESTASIS 0.05 % ophthalmic emulsion, 1 drop 2 (two) times daily.   TURMERIC PO, Take 500 mg by mouth.   venlafaxine XR (EFFEXOR-XR) 37.5 MG 24 hr capsule, TAKE 1 CAPSULE DAILY WITH BREAKFAST.  Medical History:  Past Medical History:  Diagnosis Date   Allergy    Anemia    iron therapy for years as of 10/12; normal Hgb 08/2013   Anxiety    Arthritis    Cardiomyopathy ([place])    Chest pain 04/05/2011   cardiac eval, normal treadmill stress test, Dr. Tollie Eth   Chronic back pain    Constipation    Farsightedness    wears glasses, [facility]   Gastrointestinal stromal tumor (GIST) ([place]) 06/2014    Dr. [name], [facility]   GERD (gastroesophageal reflux disease)    History of uterine fibroid    Hyperlipidemia    Hypertension    Paresthesia 09/2014   initially thought to be TIA, neurology consult in 12/2014 with other non TIA considerations.     Polyarthralgia    normal rheumatoid screen 01/2012   TIA (transient ischemic attack) 2015/16   Allergies:  Allergies  Allergen Reactions   Kiwi Extract Hives and Itching   Mucinex Dm [Dm-Guaifenesin Er] Nausea Only    Messes up her stomach. Pt report on 01/21/15   Peach [Prunus Persica] Itching    Peach peeling makes pt itch     Surgical History:  She  has a past surgical history that includes Lipoma excision; Knee arthroscopy (Left); Uterine fibroid surgery; gall stone surgery; Colonoscopy (01/2014); Esophagogastroduodenoscopy (2013); Esophagogastroduodenoscopy [id number]); EUS (N/A, 04/16/2014); Laparoscopic gastric resection (N/A, 06/09/2014); Upper gastrointestinal endoscopy; Lumbar laminectomy (N/A, 04/25/2019); and Dilatation & curettage/hysteroscopy with myosure (N/A, 07/14/2021). Family History:  Her family history includes Arthritis in her mother; Breast cancer (age of onset: 53) in her sister; Breast cancer (age of onset: 62) in her mother; Colon cancer (age of onset: 15) in her brother; Diabetes in her brother and maternal aunt; GER disease in her mother; Glaucoma in her mother; Healthy in her son; Heart disease in her maternal aunt, maternal grandfather, maternal grandmother, and maternal uncle; Hypertension in her mother, sister, and sister; Leukemia in her sister; Stroke in her father and maternal uncle.  REVIEW OF SYSTEMS  : All other systems reviewed and negative except where noted in the History of Present Illness.  PHYSICAL EXAM: LMP 02/23/2015 (LMP Unknown)  General:   Pleasant, well developed female in no acute distress Head:   Normocephalic and atraumatic. Eyes:  sclerae anicteric,conjunctive pink   Heart:   regular rate and rhythm Pulm:  Clear anteriorly; no wheezing, mild chest wall discomfort to palpation. Abdomen:   Soft, Obese AB, Sluggish bowel sounds. No tenderness . , No organomegaly appreciated. Rectal: Not evaluated Extremities:  Without edema. Msk: Symmetrical without gross deformities. Peripheral pulses intact.  Walks with cane. Neurologic:  Alert and  oriented x4;  No focal deficits.  Skin:   Dry and intact without significant lesions or rashes. Psychiatric:  Cooperative. Normal mood and affect.    Vladimir Crofts, PA-C 12:52 PM

## 2022-07-05 ENCOUNTER — Ambulatory Visit: Payer: Medicare HMO | Admitting: Physician Assistant

## 2022-07-05 NOTE — Progress Notes (Signed)
Gave patient results, she acknowledged understanding and had no further questions.

## 2022-07-10 ENCOUNTER — Other Ambulatory Visit: Payer: Self-pay | Admitting: Physician Assistant

## 2022-07-10 DIAGNOSIS — K219 Gastro-esophageal reflux disease without esophagitis: Secondary | ICD-10-CM

## 2022-07-11 ENCOUNTER — Ambulatory Visit: Payer: Medicare HMO

## 2022-07-11 DIAGNOSIS — I429 Cardiomyopathy, unspecified: Secondary | ICD-10-CM

## 2022-07-11 DIAGNOSIS — I5022 Chronic systolic (congestive) heart failure: Secondary | ICD-10-CM

## 2022-07-18 ENCOUNTER — Encounter: Payer: Self-pay | Admitting: Physician Assistant

## 2022-07-18 ENCOUNTER — Ambulatory Visit: Payer: Medicare HMO | Attending: Physician Assistant | Admitting: Physician Assistant

## 2022-07-18 VITALS — BP 115/73 | HR 66 | Resp 17 | Ht 69.0 in | Wt 240.4 lb

## 2022-07-18 DIAGNOSIS — M19041 Primary osteoarthritis, right hand: Secondary | ICD-10-CM | POA: Diagnosis not present

## 2022-07-18 DIAGNOSIS — M0579 Rheumatoid arthritis with rheumatoid factor of multiple sites without organ or systems involvement: Secondary | ICD-10-CM

## 2022-07-18 DIAGNOSIS — E559 Vitamin D deficiency, unspecified: Secondary | ICD-10-CM

## 2022-07-18 DIAGNOSIS — M25561 Pain in right knee: Secondary | ICD-10-CM

## 2022-07-18 DIAGNOSIS — S82892D Other fracture of left lower leg, subsequent encounter for closed fracture with routine healing: Secondary | ICD-10-CM

## 2022-07-18 DIAGNOSIS — M170 Bilateral primary osteoarthritis of knee: Secondary | ICD-10-CM

## 2022-07-18 DIAGNOSIS — G8929 Other chronic pain: Secondary | ICD-10-CM

## 2022-07-18 DIAGNOSIS — M7061 Trochanteric bursitis, right hip: Secondary | ICD-10-CM | POA: Diagnosis not present

## 2022-07-18 DIAGNOSIS — Z8673 Personal history of transient ischemic attack (TIA), and cerebral infarction without residual deficits: Secondary | ICD-10-CM

## 2022-07-18 DIAGNOSIS — Z79899 Other long term (current) drug therapy: Secondary | ICD-10-CM

## 2022-07-18 DIAGNOSIS — M25361 Other instability, right knee: Secondary | ICD-10-CM

## 2022-07-18 DIAGNOSIS — M19071 Primary osteoarthritis, right ankle and foot: Secondary | ICD-10-CM

## 2022-07-18 DIAGNOSIS — M19042 Primary osteoarthritis, left hand: Secondary | ICD-10-CM

## 2022-07-18 DIAGNOSIS — M7062 Trochanteric bursitis, left hip: Secondary | ICD-10-CM

## 2022-07-18 DIAGNOSIS — M67432 Ganglion, left wrist: Secondary | ICD-10-CM

## 2022-07-18 DIAGNOSIS — M5136 Other intervertebral disc degeneration, lumbar region: Secondary | ICD-10-CM

## 2022-07-18 DIAGNOSIS — M19072 Primary osteoarthritis, left ankle and foot: Secondary | ICD-10-CM

## 2022-07-18 DIAGNOSIS — K219 Gastro-esophageal reflux disease without esophagitis: Secondary | ICD-10-CM

## 2022-07-18 DIAGNOSIS — M85852 Other specified disorders of bone density and structure, left thigh: Secondary | ICD-10-CM

## 2022-07-18 DIAGNOSIS — Z8639 Personal history of other endocrine, nutritional and metabolic disease: Secondary | ICD-10-CM

## 2022-07-18 DIAGNOSIS — I10 Essential (primary) hypertension: Secondary | ICD-10-CM

## 2022-07-18 MED ORDER — LIDOCAINE 5 % EX PTCH: 1.0000 | MEDICATED_PATCH | CUTANEOUS | 0 refills | Status: DC

## 2022-07-18 MED ORDER — HYDROXYCHLOROQUINE SULFATE 200 MG PO TABS: ORAL_TABLET | ORAL | 0 refills | Status: DC

## 2022-07-18 NOTE — Addendum Note (Signed)
Addended by: Ellen Henri on: 07/18/2022 04:27 PM   Modules accepted: Orders

## 2022-07-18 NOTE — Patient Instructions (Signed)
Standing Labs We placed an order today for your standing lab work.   Please have your standing labs drawn in July and every 3 months   Please have your labs drawn 2 weeks prior to your appointment so that the provider can discuss your lab results at your appointment, if possible.  Please note that you may see your imaging and lab results in MyChart before we have reviewed them. We will contact you once all results are reviewed. Please allow our office up to 72 hours to thoroughly review all of the results before contacting the office for clarification of your results.  WALK-IN LAB HOURS  Monday through Thursday from 8:00 am -12:30 pm and 1:00 pm-5:00 pm and Friday from 8:00 am-12:00 pm.  Patients with office visits requiring labs will be seen before walk-in labs.  You may encounter longer than normal wait times. Please allow additional time. Wait times may be shorter on  Monday and Thursday afternoons.  We do not book appointments for walk-in labs. We appreciate your patience and understanding with our staff.   Labs are drawn by [facility]. Please bring your co-pay at the time of your lab draw.  You may receive a bill from [facility] for your lab work.  Please note if you are on Hydroxychloroquine and and an order has been placed for a Hydroxychloroquine level,  you will need to have it drawn 4 hours or more after your last dose.  If you wish to have your labs drawn at another location, please call the office 24 hours in advance so we can fax the orders.  The office is located at [address], [address], [place], [place] [address]   If you have any questions regarding directions or hours of operation,  please call [phone].   As a reminder, please drink plenty of water prior to coming for your lab work. Thanks!  

## 2022-07-18 NOTE — Progress Notes (Signed)
Called patient no answer could not leave a vm

## 2022-07-18 NOTE — Progress Notes (Signed)
Reviewed results patient verbalized understanding.

## 2022-07-19 ENCOUNTER — Other Ambulatory Visit: Payer: Self-pay

## 2022-07-19 DIAGNOSIS — I5022 Chronic systolic (congestive) heart failure: Secondary | ICD-10-CM

## 2022-07-19 DIAGNOSIS — I429 Cardiomyopathy, unspecified: Secondary | ICD-10-CM

## 2022-07-19 LAB — COMPLETE METABOLIC PANEL WITH GFR
AG Ratio: 1.7 (calc) (ref 1.0–2.5)
ALT: 15 U/L (ref 6–29)
AST: 17 U/L (ref 10–35)
Albumin: 4.1 g/dL (ref 3.6–5.1)
Alkaline phosphatase (APISO): 97 U/L (ref 37–153)
BUN: 11 mg/dL (ref 7–25)
CO2: 29 mmol/L (ref 20–32)
Calcium: 9.1 mg/dL (ref 8.6–10.4)
Chloride: 108 mmol/L (ref 98–110)
Creat: 0.77 mg/dL (ref 0.50–1.05)
Globulin: 2.4 g/dL (calc) (ref 1.9–3.7)
Glucose, Bld: 81 mg/dL (ref 65–139)
Potassium: 4.6 mmol/L (ref 3.5–5.3)
Sodium: 144 mmol/L (ref 135–146)
Total Bilirubin: 0.3 mg/dL (ref 0.2–1.2)
Total Protein: 6.5 g/dL (ref 6.1–8.1)
eGFR: 88 mL/min/{1.73_m2} (ref >=60)

## 2022-07-19 LAB — CBC WITH DIFFERENTIAL/PLATELET
Absolute Monocytes: 536 cells/uL (ref 200–950)
Basophils Absolute: 32 cells/uL (ref 0–200)
Basophils Relative: 0.5 %
Eosinophils Absolute: 101 cells/uL (ref 15–500)
Eosinophils Relative: 1.6 %
HCT: 41 % (ref 35.0–45.0)
Hemoglobin: 12.8 g/dL (ref 11.7–15.5)
Lymphs Abs: 2822 cells/uL (ref 850–3900)
MCH: 26.6 pg — ABNORMAL LOW (ref 27.0–33.0)
MCHC: 31.2 g/dL — ABNORMAL LOW (ref 32.0–36.0)
MCV: 85.2 fL (ref 80.0–100.0)
MPV: 10 fL (ref 7.5–12.5)
Monocytes Relative: 8.5 %
Neutro Abs: 2810 cells/uL (ref 1500–7800)
Neutrophils Relative %: 44.6 %
Platelets: 303 10*3/uL (ref 140–400)
RBC: 4.81 10*6/uL (ref 3.80–5.10)
RDW: 12.6 % (ref 11.0–15.0)
Total Lymphocyte: 44.8 %
WBC: 6.3 10*3/uL (ref 3.8–10.8)

## 2022-07-19 NOTE — Progress Notes (Signed)
CBC and CMP WNL

## 2022-07-26 ENCOUNTER — Ambulatory Visit: Payer: Medicare HMO | Admitting: Cardiology

## 2022-07-27 ENCOUNTER — Telehealth: Payer: Self-pay | Admitting: *Deleted

## 2022-07-27 ENCOUNTER — Ambulatory Visit: Payer: Medicare HMO | Admitting: Cardiology

## 2022-07-27 ENCOUNTER — Encounter: Payer: Self-pay | Admitting: Cardiology

## 2022-07-27 VITALS — BP 113/74 | HR 77 | Ht 69.0 in | Wt 236.0 lb

## 2022-07-27 DIAGNOSIS — I429 Cardiomyopathy, unspecified: Secondary | ICD-10-CM

## 2022-07-27 DIAGNOSIS — E6609 Other obesity due to excess calories: Secondary | ICD-10-CM

## 2022-07-27 DIAGNOSIS — I10 Essential (primary) hypertension: Secondary | ICD-10-CM

## 2022-07-27 DIAGNOSIS — I5022 Chronic systolic (congestive) heart failure: Secondary | ICD-10-CM

## 2022-07-27 DIAGNOSIS — Z8673 Personal history of transient ischemic attack (TIA), and cerebral infarction without residual deficits: Secondary | ICD-10-CM

## 2022-07-27 DIAGNOSIS — E782 Mixed hyperlipidemia: Secondary | ICD-10-CM

## 2022-07-27 MED ORDER — DAPAGLIFLOZIN PROPANEDIOL 10 MG PO TABS: 10.0000 mg | ORAL_TABLET | Freq: Every day | ORAL | 1 refills | Status: AC

## 2022-07-27 MED ORDER — SPIRONOLACTONE 25 MG PO TABS: 25.0000 mg | ORAL_TABLET | Freq: Every day | ORAL | 1 refills | Status: DC

## 2022-07-27 NOTE — Telephone Encounter (Signed)
Attempted to contact the patient and unable to leave a message, recording comes on stating to please enter your remote access code.

## 2022-07-27 NOTE — Telephone Encounter (Signed)
Received a fax regarding Prior Authorization from [employer] for Lidoderm Patches. Authorization has been DENIED because it is not being prescribed for pain associated with post-herpetic neuralgia, pain associated with diabetic neuropathy ot pain associated with cancer-related neuropathy.   Phone# [phone]

## 2022-07-27 NOTE — Progress Notes (Signed)
Lisa Briggs Date of Birth: 1960-09-29 MRN: [id number] Primary Care Provider:Shah, Melanee Spry, MD Former Cardiology Providers: [place], APRN, FNP-C Primary Cardiologist: Tessa Lerner, DO, [facility] (established care 07/02/2019)  Date: 07/27/22 Last Office Visit: 01/02/2022  Chief Complaint  Patient presents with   Chronic HFrEF (heart failure with reduced ejection fraction   Follow-up   HPI  Lisa Briggs is a 62 y.o.  female whose past medical history and cardiovascular risk factors include: Seropositive rheumatoid arthritis (Deveshwar), osteoarthritis, DDD, benign essential hypertension, transient ischemic attack, mildly reduced left ventricular systolic function consistent with cardiomyopathy, obesity.  Patient is being followed by the practice given her mildly reduced LVEF and multiple cardiovascular risk factors.  She is here for 25-month follow-up visit.  Since last office visit, patient is doing well from a cardiovascular standpoint.  He denies anginal chest pain or heart failure symptoms.  She has lost 13 pounds since last office visit for which she is congratulated for patient states that it is likely secondary to reducing caloric intake as she is been busy taking care of her mother.  ALLERGIES: Allergies  Allergen Reactions   Kiwi Extract Hives and Itching   Mucinex Dm [Dm-Guaifenesin Er] Nausea Only    Messes up her stomach. Pt report on 01/21/15   Peach [Prunus Persica] Itching    Peach peeling makes pt itch     MEDICATION LIST PRIOR TO VISIT: Current Outpatient Medications on File Prior to Visit  Medication Sig Dispense Refill   blood glucose meter kit and supplies KIT Test once daily-DX-E11.69 1 each 0   carvedilol (COREG) 6.25 MG tablet Take 1 tablet (6.25 mg total) by mouth 2 (two) times daily. 180 tablet 3   Cholecalciferol (VITAMIN D3) 50 MCG (2000 UT) capsule Take 2,000 Units by mouth daily.     Cyanocobalamin (VITAMIN B-12 PO) Take by mouth daily.      dexlansoprazole (DEXILANT) 60 MG capsule Take 60 mg by mouth daily.     diclofenac Sodium (VOLTAREN) 1 % GEL APPLY 2 GRAMS TOPICALLY 4 TIMES DAILY 100 g 0   docusate sodium (COLACE) 100 MG capsule Take 100 mg by mouth daily.     ENTRESTO 49-51 MG Take 1 tablet by mouth 2 (two) times daily.     EQ ASPIRIN ADULT LOW DOSE 81 MG EC tablet Take 1 tablet by mouth once daily 90 tablet 0   famotidine (PEPCID) 20 MG tablet Take 20 mg by mouth daily.     gabapentin (NEURONTIN) 300 MG capsule TAKE 1 CAPSULE BY MOUTH THREE TIMES DAILY 90 capsule 5   Glucose Blood (BLOOD GLUCOSE TEST STRIPS) STRP Inject 1 each as directed daily. 100 strip 5   hydroxychloroquine (PLAQUENIL) 200 MG tablet Take 1 tablet by mouth twice daily. 180 tablet 0   Insulin Pen Needle (BD PEN NEEDLE NANO U/F) 32G X 4 MM MISC 1 each by Does not apply route at bedtime. 100 each 11   lidocaine (LIDODERM) 5 % Place 1 patch onto the skin daily. Remove & Discard patch within 12 hours or as directed by MD 15 patch 0   linaclotide (LINZESS) 290 MCG CAPS capsule Take 1 capsule (290 mcg total) by mouth daily before breakfast. 90 capsule 3   Misc. Devices (ROLLATOR ULTRA-LIGHT) MISC Use as directed. 1 each 0   Multiple Vitamin (MULTIVITAMIN PO) Take by mouth daily.     ondansetron (ZOFRAN) 4 MG tablet TAKE 1 TABLET BY MOUTH EVERY 8 HOURS AS NEEDED FOR  NAUSEA OR VOMITING 90 tablet 0   pantoprazole (PROTONIX) 40 MG tablet Take 1 tablet by mouth once daily 90 tablet 0   potassium chloride (KLOR-CON) 10 MEQ tablet Take 1 tablet by mouth twice daily 180 tablet 0   pyridOXINE (VITAMIN B6) 100 MG tablet Take 100 mg by mouth daily.     RESTASIS 0.05 % ophthalmic emulsion 1 drop 2 (two) times daily.     rosuvastatin (CRESTOR) 20 MG tablet Take 1 tablet (20 mg total) by mouth daily. 90 tablet 3   TURMERIC PO Take 500 mg by mouth.     venlafaxine XR (EFFEXOR-XR) 37.5 MG 24 hr capsule TAKE 1 CAPSULE DAILY WITH BREAKFAST. 90 capsule 0   Vitamin A 3 MG (10000  UT) TABS Take by mouth.     Fish Oil-Cholecalciferol (OMEGA-3 & VITAMIN D3 GUMMIES PO) Take 2,000 mg by mouth daily at 12 noon. (Patient not taking: Reported on 07/18/2022)     Fish Oil-Cholecalciferol (OMEGA-3 + VITAMIN D3 PO) Take 1 capsule by mouth once a week. Take 2000 mg once a week. (Patient not taking: Reported on 07/18/2022)     fluticasone (FLONASE) 50 MCG/ACT nasal spray Place 2 sprays into both nostrils in the morning and at bedtime. 16 mL 2   No current facility-administered medications on file prior to visit.    PAST MEDICAL HISTORY: Past Medical History:  Diagnosis Date   Allergy    Anemia    iron therapy for years as of 10/12; normal Hgb 08/2013   Anxiety    Arthritis    Cardiomyopathy    Chest pain 04/05/2011   cardiac eval, normal treadmill stress test, Dr. 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 Axel Filler, [facility]   GERD (gastroesophageal reflux disease)    History of uterine fibroid    Hyperlipidemia    Hypertension    Paresthesia 09/2014   initially thought to be TIA, neurology consult in 12/2014 with other non TIA considerations.     Polyarthralgia    normal rheumatoid screen 01/2012   TIA (transient ischemic attack) 2015/16    PAST SURGICAL HISTORY: Past Surgical History:  Procedure Laterality Date   COLONOSCOPY  01/2014   diverticulosis, othwerise normal - Dr. Rob Bunting   DILATATION & CURETTAGE/HYSTEROSCOPY WITH MYOSURE N/A 07/14/2021   Procedure: DILATATION & CURETTAGE/HYSTEROSCOPY WITH MYOSURE Reach;  Surgeon: Shea Evans, MD;  [facility]: [facility];  Service: Gynecology;  Laterality: N/A;   ESOPHAGOGASTRODUODENOSCOPY  2013   Dr. Elnoria Howard, gastritis   ESOPHAGOGASTRODUODENOSCOPY  [id number]   EUS N/A 04/16/2014   Procedure: UPPER ENDOSCOPIC ULTRASOUND (EUS) LINEAR;  Surgeon: Rachael Fee, MD;   Location: [facility];  Service: Endoscopy;  Laterality: N/A;   gall stone surgery     KNEE ARTHROSCOPY Left    LAPAROSCOPIC GASTRIC RESECTION N/A 06/09/2014   Procedure: LAPAROSCOPIC GASTRIC MASS RESECTION;  Surgeon: Axel Filler, MD;  Location: [facility];  Service: General;  Laterality: N/A;   LIPOMA EXCISION     forehead   LUMBAR LAMINECTOMY N/A 04/25/2019   Procedure: LEFT L2-3 MICRODISCECTOMY, BILATERAL L5-S1 PARTIAL HEMILAMINECTOMY;  Surgeon: Kerrin Champagne, MD;  Location: [facility];  Service: Orthopedics;  Laterality: N/A;   UPPER GASTROINTESTINAL ENDOSCOPY     UTERINE FIBROID SURGERY      FAMILY HISTORY: The patient's family history includes Arthritis  in her mother; Breast cancer (age of onset: 79) in her sister; Breast cancer (age of onset: 78) in her mother; Colon cancer (age of onset: 52) in her brother; Diabetes in her brother and maternal aunt; GER disease in her mother; Glaucoma in her mother; Healthy in her son; Heart disease in her maternal aunt, maternal grandfather, maternal grandmother, and maternal uncle; Hypertension in her mother, sister, and sister; Leukemia in her sister; Stroke in her father and maternal uncle.   SOCIAL HISTORY:  The patient  reports that she has never smoked. She has been exposed to tobacco smoke. She has never used smokeless tobacco. She reports that she does not drink alcohol and does not use drugs.  Review of Systems  Cardiovascular:  Negative for chest pain, dyspnea on exertion, leg swelling, orthopnea, palpitations, paroxysmal nocturnal dyspnea and syncope.  Respiratory:  Negative for shortness of breath.   Musculoskeletal:  Positive for joint pain (right knee).    PHYSICAL EXAM:    07/27/2022    2:52 PM 07/18/2022   10:52 AM 02/16/2022   10:36 AM  Vitals with BMI  Height 5\' 9"  5\' 9"  5\' 9"   Weight 236 lbs 240 lbs 6 oz 246 lbs  BMI 34.84 35.48 36.31  Systolic 113 115 409  Diastolic 74 73 82  Pulse 77 66 57   Physical Exam  Constitutional:  No distress.  Age appropriate, hemodynamically stable, ambulates with a cane.  Neck: No JVD present.  Cardiovascular: Normal rate, regular rhythm, S1 normal, S2 normal, intact distal pulses and normal pulses. Exam reveals no gallop, no S3 and no S4.  No murmur heard. Pulmonary/Chest: Effort normal and breath sounds normal. No stridor. She has no wheezes. She has no rales.  Abdominal: Soft. Bowel sounds are normal. She exhibits no distension. There is no abdominal tenderness.  Musculoskeletal:        General: No edema.     Cervical back: Neck supple.  Neurological: She is alert and oriented to person, place, and time. She has intact cranial nerves (2-12).  Skin: Skin is warm and moist.   CARDIAC DATABASE: EKG: July 27, 2022: Sinus rhythm, 68 bpm, normal axis, without underlying ischemia or injury pattern.  Echocardiogram: 04/17/2019: LVEF 45-50%, grade 2 diastolic dysfunction, elevated left atrial pressure, mild TR, no pulmonary hypertension.  06/05/2019: LVEF 45-50%, normal wall thickness, mild global hypokinesis, grade 1 diastolic impairment, normal left atrial pressure, mild MR, mild TR, mild TR, no pulmonary hypertension.  07/11/2022:  Mildly depressed LV systolic function with visual EF 45-50%. Left ventricle cavity is normal in size. Mild concentric hypertrophy of the left ventricle. Hypokinetic global wall motion. Doppler evidence of grade I (impaired) diastolic dysfunction, normal LAP. Calculated EF 49%. Structurally normal mitral valve. Mild (Grade I) mitral regurgitation. Structurally normal tricuspid valve. Mild tricuspid regurgitation. No evidence of pulmonary hypertension. Pericardium is normal. Trace pericardial effusion. No significant change compared to 06/2019.   Stress Testing:  Lexiscan  Sestamibi Stress Test 07/09/2019: Myocardial perfusion is normal. Stress LV EF: 51%.  No previous exam available for comparison. Low risk study.   Heart  Catheterization: None  LABORATORY DATA:    Latest Ref Rng & Units 07/18/2022   11:12 AM 01/18/2022   11:59 AM 08/28/2021    5:27 PM  CBC  WBC 3.8 - 10.8 Thousand/uL 6.3  6.0  6.8   Hemoglobin 11.7 - 15.5 g/dL 81.1  91.4  78.2   Hematocrit 35.0 - 45.0 % 41.0  37.8  39.4  Platelets 140 - 400 Thousand/uL 303  308  279        Latest Ref Rng & Units 07/18/2022   11:12 AM 06/29/2022    1:30 PM 01/18/2022   11:59 AM  CMP  Glucose 65 - 139 mg/dL 81  81  77   BUN 7 - 25 mg/dL Creatinine 0.50 - 1.05 mg/dL 7.82  9.56  2.13   Sodium 135 - 146 mmol/L 144  144  142   Potassium 3.5 - 5.3 mmol/L 4.6  4.7  4.5   Chloride 98 - 110 mmol/L 108  105  106   CO2 20 - 32 mmol/L 29  25  32   Calcium 8.6 - 10.4 mg/dL 9.1  9.4  9.0   Total Protein 6.1 - 8.1 g/dL 6.5   6.7   Total Bilirubin 0.2 - 1.2 mg/dL 0.3   0.3   AST 10 - 35 U/L 17   22   ALT 6 - 29 U/L 15   17     Lipid Panel  Lab Results  Component Value Date   CHOL 148 01/27/2021   HDL 65 01/27/2021   LDLCALC 71 01/27/2021   TRIG 58 01/27/2021   CHOLHDL 2.3 01/27/2021    Lab Results  Component Value Date   HGBA1C 5.8 (A) 06/23/2021   HGBA1C 5.7 (H) 01/27/2021   HGBA1C 6.0 (H) 11/19/2019   No components found for: "NTPROBNP" Lab Results  Component Value Date   TSH 1.700 11/19/2019   TSH 1.860 08/30/2018   TSH 0.56 09/20/2016    Cardiac Panel (last 3 results) No results for input(s): "CKTOTAL", "CKMB", "TROPONINIHS", "RELINDX" in the last 72 hours.  FINAL MEDICATION LIST END OF ENCOUNTER: Meds ordered this encounter  Medications   spironolactone (ALDACTONE) 25 MG tablet    Sig: Take 1 tablet (25 mg total) by mouth daily.    Dispense:  90 tablet    Refill:  1   dapagliflozin propanediol (FARXIGA) 10 MG TABS tablet    Sig: Take 1 tablet (10 mg total) by mouth daily before breakfast.    Dispense:  90 tablet    Refill:  1     Medications Discontinued During This Encounter  Medication Reason    cyclobenzaprine (FLEXERIL) 10 MG tablet    etodolac (LODINE) 400 MG tablet    spironolactone (ALDACTONE) 25 MG tablet Reorder   dapagliflozin propanediol (FARXIGA) 10 MG TABS tablet Reorder     Current Outpatient Medications:    blood glucose meter kit and supplies KIT, Test once daily-DX-E11.69, Disp: 1 each, Rfl: 0   carvedilol (COREG) 6.25 MG tablet, Take 1 tablet (6.25 mg total) by mouth 2 (two) times daily., Disp: 180 tablet, Rfl: 3   Cholecalciferol (VITAMIN D3) 50 MCG (2000 UT) capsule, Take 2,000 Units by mouth daily., Disp: , Rfl:    Cyanocobalamin (VITAMIN B-12 PO), Take by mouth daily., Disp: , Rfl:    dexlansoprazole (DEXILANT) 60 MG capsule, Take 60 mg by mouth daily., Disp: , Rfl:    diclofenac Sodium (VOLTAREN) 1 % GEL, APPLY 2 GRAMS TOPICALLY 4 TIMES DAILY, Disp: 100 g, Rfl: 0   docusate sodium (COLACE) 100 MG capsule, Take 100 mg by mouth daily., Disp: , Rfl:    ENTRESTO 49-51 MG, Take 1 tablet by mouth 2 (two) times daily., Disp: , Rfl:    EQ ASPIRIN ADULT LOW DOSE 81 MG EC tablet, Take 1 tablet by mouth once daily, Disp:  90 tablet, Rfl: 0   famotidine (PEPCID) 20 MG tablet, Take 20 mg by mouth daily., Disp: , Rfl:    gabapentin (NEURONTIN) 300 MG capsule, TAKE 1 CAPSULE BY MOUTH THREE TIMES DAILY, Disp: 90 capsule, Rfl: 5   Glucose Blood (BLOOD GLUCOSE TEST STRIPS) STRP, Inject 1 each as directed daily., Disp: 100 strip, Rfl: 5   hydroxychloroquine (PLAQUENIL) 200 MG tablet, Take 1 tablet by mouth twice daily., Disp: 180 tablet, Rfl: 0   Insulin Pen Needle (BD PEN NEEDLE NANO U/F) 32G X 4 MM MISC, 1 each by Does not apply route at bedtime., Disp: 100 each, Rfl: 11   lidocaine (LIDODERM) 5 %, Place 1 patch onto the skin daily. Remove & Discard patch within 12 hours or as directed by MD, Disp: 15 patch, Rfl: 0   linaclotide (LINZESS) 290 MCG CAPS capsule, Take 1 capsule (290 mcg total) by mouth daily before breakfast., Disp: 90 capsule, Rfl: 3   Misc. Devices (ROLLATOR  ULTRA-LIGHT) MISC, Use as directed., Disp: 1 each, Rfl: 0   Multiple Vitamin (MULTIVITAMIN PO), Take by mouth daily., Disp: , Rfl:    ondansetron (ZOFRAN) 4 MG tablet, TAKE 1 TABLET BY MOUTH EVERY 8 HOURS AS NEEDED FOR NAUSEA OR VOMITING, Disp: 90 tablet, Rfl: 0   pantoprazole (PROTONIX) 40 MG tablet, Take 1 tablet by mouth once daily, Disp: 90 tablet, Rfl: 0   potassium chloride (KLOR-CON) 10 MEQ tablet, Take 1 tablet by mouth twice daily, Disp: 180 tablet, Rfl: 0   pyridOXINE (VITAMIN B6) 100 MG tablet, Take 100 mg by mouth daily., Disp: , Rfl:    RESTASIS 0.05 % ophthalmic emulsion, 1 drop 2 (two) times daily., Disp: , Rfl:    rosuvastatin (CRESTOR) 20 MG tablet, Take 1 tablet (20 mg total) by mouth daily., Disp: 90 tablet, Rfl: 3   TURMERIC PO, Take 500 mg by mouth., Disp: , Rfl:    venlafaxine XR (EFFEXOR-XR) 37.5 MG 24 hr capsule, TAKE 1 CAPSULE DAILY WITH BREAKFAST., Disp: 90 capsule, Rfl: 0   Vitamin A 3 MG (10000 UT) TABS, Take by mouth., Disp: , Rfl:    dapagliflozin propanediol (FARXIGA) 10 MG TABS tablet, Take 1 tablet (10 mg total) by mouth daily before breakfast., Disp: 90 tablet, Rfl: 1   Fish Oil-Cholecalciferol (OMEGA-3 & VITAMIN D3 GUMMIES PO), Take 2,000 mg by mouth daily at 12 noon. (Patient not taking: Reported on 07/18/2022), Disp: , Rfl:    Fish Oil-Cholecalciferol (OMEGA-3 + VITAMIN D3 PO), Take 1 capsule by mouth once a week. Take 2000 mg once a week. (Patient not taking: Reported on 07/18/2022), Disp: , Rfl:    fluticasone (FLONASE) 50 MCG/ACT nasal spray, Place 2 sprays into both nostrils in the morning and at bedtime., Disp: 16 mL, Rfl: 2   spironolactone (ALDACTONE) 25 MG tablet, Take 1 tablet (25 mg total) by mouth daily., Disp: 90 tablet, Rfl: 1  IMPRESSION:    ICD-10-CM   1. Chronic HFrEF (heart failure with reduced ejection fraction)  I50.22 EKG 12-Lead    spironolactone (ALDACTONE) 25 MG tablet    dapagliflozin propanediol (FARXIGA) 10 MG TABS tablet    2.  Cardiomyopathy, presumed nonischemic  I42.9 spironolactone (ALDACTONE) 25 MG tablet    dapagliflozin propanediol (FARXIGA) 10 MG TABS tablet    3. Benign hypertension  I10     4. Mixed hyperlipidemia  E78.2     5. History of TIA (transient ischemic attack)  Z86.73     6. Class 1 obesity due  to excess calories with serious comorbidity and body mass index (BMI) of 34.0 to 34.9 in adult  E66.09    Z68.34        RECOMMENDATIONS: Lisa Briggs is a 62 y.o. female whose past medical history and cardiovascular risk factors include: Seropositive rheumatoid arthritis (Deveshwar), osteoarthritis, DDD, benign essential hypertension, transient ischemic attack, mildly reduced left ventricular systolic function consistent with cardiomyopathy, obesity.  Chronic HFrEF (heart failure with reduced ejection fraction) (HCC) / Cardiomyopathy, presumed nonischemic Stage B, NYHA class II/III. Very well compensated. Medications reconciled. Refilled [place] and spironolactone Most recent echocardiogram notes mildly reduced LVEF no significant change when compared to prior studies  Benign hypertension Office blood pressures are well-controlled. No changes warranted at this time.  Mixed hyperlipidemia Currently on Crestor.   She denies myalgia or other side effects. Currently managed by primary care provider.  History of TIA (transient ischemic attack) Reemphasized importance of secondary prevention.  Class 1 severe obesity due to excess calories with serious comorbidity and body mass index (BMI) of 34.0 to 34.9 in adult [facility]) Body mass index is 34.85 kg/m. Has lost 13 pounds since last office visit due to lifestyle changes. She is congratulated on her weight loss journey. I reviewed with the patient the importance of diet, regular physical activity/exercise, weight loss.   Patient is educated on increasing physical activity gradually as tolerated.  With the goal of moderate intensity exercise for 30  minutes a day 5 days a week.  Labs from 07/18/2022 independently reviewed and noted above for further reference.  Orders Placed This Encounter  Procedures   EKG 12-Lead    --Continue cardiac medications as reconciled in final medication list. --Return in about 6 months (around 01/26/2023) for Follow up, heart failure management.. Or sooner if needed. --Continue follow-up with your primary care physician regarding the management of your other chronic comorbid conditions.  Patient's questions and concerns were addressed to her satisfaction. She voices understanding of the instructions provided during this encounter.   This note was created using a voice recognition software as a result there may be grammatical errors inadvertently enclosed that do not reflect the nature of this encounter. Every attempt is made to correct such errors.  Tessa Lerner, [place], [facility]  Pager:  [phone] Office: [phone]

## 2022-07-27 NOTE — Telephone Encounter (Signed)
Please notify the patient.  She will be establishing care with pain management soon.

## 2022-07-27 NOTE — Telephone Encounter (Signed)
Submitted a Prior Authorization request to [employer] for Lidoderm Patches via CoverMyMeds. Will update once we receive a response.

## 2022-07-31 NOTE — Telephone Encounter (Signed)
Unable to leave message

## 2022-08-01 NOTE — Telephone Encounter (Signed)
Advised patient about denial for lidoderm patches. Advised patient she can wait until she sees pain management for a rx or use good rx coupon to purchase. Patient states that she will get pain management to prescribe. Provided patient with [facility] pain managements phone number since they have tried to reach her to schedule with no success.

## 2022-08-01 NOTE — Telephone Encounter (Signed)
Unable to leave message

## 2022-08-02 ENCOUNTER — Telehealth: Payer: Self-pay

## 2022-08-02 NOTE — Telephone Encounter (Signed)
Patient contacted the office and states she was supposed to be prescribed Omega 3 at the last visit. After reviewing the chart, there is no prescription for Omega 3 from our office. Patient states if a prescription can be sent she would like it sent to [facility] on [address]. Patient call back number is [phone]. Please advise.

## 2022-08-02 NOTE — Telephone Encounter (Signed)
We have not prescribed omega 3 for the patient in the past.  She will need to reach out to her PCP or cardiologist to find out prescribing provider.

## 2022-08-02 NOTE — Telephone Encounter (Signed)
Patient advised We have not prescribed omega 3 for the patient in the past. She will need to reach out to her PCP or cardiologist to find out prescribing provider. Patient verbalized understanding.

## 2022-08-02 NOTE — Telephone Encounter (Signed)
Patient contacted the office to state she needs a refill of Plaquenil. Advised patient there was a refill sent on 07/18/2022 with a three month supply. Patient states she will contact the pharmacy.

## 2022-08-10 ENCOUNTER — Other Ambulatory Visit: Payer: Self-pay | Admitting: *Deleted

## 2022-08-10 ENCOUNTER — Telehealth: Payer: Self-pay | Admitting: *Deleted

## 2022-08-10 DIAGNOSIS — M7061 Trochanteric bursitis, right hip: Secondary | ICD-10-CM

## 2022-08-10 DIAGNOSIS — M0579 Rheumatoid arthritis with rheumatoid factor of multiple sites without organ or systems involvement: Secondary | ICD-10-CM

## 2022-08-10 DIAGNOSIS — M170 Bilateral primary osteoarthritis of knee: Secondary | ICD-10-CM

## 2022-08-10 DIAGNOSIS — M19041 Primary osteoarthritis, right hand: Secondary | ICD-10-CM

## 2022-08-10 DIAGNOSIS — M19072 Primary osteoarthritis, left ankle and foot: Secondary | ICD-10-CM

## 2022-08-10 DIAGNOSIS — M25361 Other instability, right knee: Secondary | ICD-10-CM

## 2022-08-10 DIAGNOSIS — G8929 Other chronic pain: Secondary | ICD-10-CM

## 2022-08-10 NOTE — Telephone Encounter (Signed)
I called patient, RX at front desk for patient to pick up.

## 2022-08-10 NOTE — Telephone Encounter (Signed)
Patient requesting RX for [employer]. Patient states she lost her cane.

## 2022-08-10 NOTE — Telephone Encounter (Signed)
Okay to provide written prescription for a cane to assist with ambulation

## 2022-08-25 ENCOUNTER — Other Ambulatory Visit: Payer: Self-pay | Admitting: Medical

## 2022-10-29 ENCOUNTER — Other Ambulatory Visit: Payer: Self-pay | Admitting: Physician Assistant

## 2022-10-29 DIAGNOSIS — M0579 Rheumatoid arthritis with rheumatoid factor of multiple sites without organ or systems involvement: Secondary | ICD-10-CM

## 2022-10-29 DIAGNOSIS — K219 Gastro-esophageal reflux disease without esophagitis: Secondary | ICD-10-CM

## 2022-10-30 NOTE — Telephone Encounter (Signed)
Last Fill: 07/18/2022  Eye exam: 10/18/2021 WNL   Labs: 07/18/2022 CBC and CMP WNL   Next Visit: 12/20/2022  Last Visit: 07/18/2022  DX: Rheumatoid arthritis involving multiple sites with positive rheumatoid factor   Current Dose per office note 07/18/2022: Plaquenil 200 mg 1 tablet by mouth twice daily.   Patient advised she is due to update her PLQ eye exam. Patient states she has an appointment scheduled for tomorrow.   Okay to refill Plaquenil?

## 2022-11-15 NOTE — Telephone Encounter (Signed)
No action done

## 2022-11-17 ENCOUNTER — Other Ambulatory Visit: Payer: Self-pay | Admitting: Family Medicine

## 2022-11-17 DIAGNOSIS — I70209 Unspecified atherosclerosis of native arteries of extremities, unspecified extremity: Secondary | ICD-10-CM

## 2022-11-27 ENCOUNTER — Ambulatory Visit
Admission: RE | Admit: 2022-11-27 | Discharge: 2022-11-27 | Disposition: A | Discharge status: Home / Self Care | Payer: Medicare HMO | Source: Ambulatory Visit | Attending: Family Medicine | Admitting: Family Medicine

## 2022-11-27 DIAGNOSIS — I70209 Unspecified atherosclerosis of native arteries of extremities, unspecified extremity: Secondary | ICD-10-CM

## 2022-12-06 ENCOUNTER — Other Ambulatory Visit: Payer: Self-pay | Admitting: Family Medicine

## 2022-12-06 DIAGNOSIS — Z1231 Encounter for screening mammogram for malignant neoplasm of breast: Secondary | ICD-10-CM

## 2022-12-08 NOTE — Progress Notes (Addendum)
Office Visit Note  [name]             Date of Birth: 29-Apr-1960           MRN: [id number]             PCP: Ellyn Hack, MD Referring: Ellyn Hack, MD Visit Date: 12/20/2022 Occupation: @GUAROCC @  Subjective:  Medication management  History of Present Illness: Lisa Briggs is a 62 y.o. female with seropositive rheumatoid arthritis and osteoarthritis.  She states she continues to have pain and discomfort in multiple joints.  She complains of discomfort in her neck, both hands, knee joints.  She states she notices intermittent swelling in her hands.  She continues to take hydroxychloroquine 200 mg p.o. twice daily.  She denies any interruption in the treatment.  She had eye examination in July 2024.    Activities of Daily Living:  Patient reports morning stiffness for several hours.   Patient Reports nocturnal pain.  Difficulty dressing/grooming: Reports Difficulty climbing stairs: Reports Difficulty getting out of chair: Reports Difficulty using hands for taps, buttons, cutlery, and/or writing: Reports  Review of Systems  Constitutional:  Positive for fatigue.  HENT:  Positive for mouth dryness. Negative for mouth sores.   Eyes:  Negative for dryness.  Respiratory:  Negative for shortness of breath.   Cardiovascular:  Negative for chest pain and palpitations.  Gastrointestinal:  Positive for constipation. Negative for blood in stool and diarrhea.  Endocrine: Negative for increased urination.  Genitourinary:  Negative for involuntary urination.  Musculoskeletal:  Positive for joint pain, gait problem, joint pain, joint swelling, myalgias, muscle weakness, morning stiffness, muscle tenderness and myalgias.  Skin:  Positive for color change and sensitivity to sunlight. Negative for rash and hair loss.  Allergic/Immunologic: Negative for susceptible to infections.  Neurological:  Positive for dizziness. Negative for headaches.  Hematological:  Negative  for swollen glands.  Psychiatric/Behavioral:  Positive for sleep disturbance. Negative for depressed mood. The patient is not nervous/anxious.     PMFS History:  Patient Active Problem List   Diagnosis Date Noted   Unilateral primary osteoarthritis, right knee 05/04/2022   Bilateral ankle pain 09/15/2021   Chronic HFrEF (heart failure with reduced ejection fraction) (HCC) 07/29/2021   Advance directive discussed with patient 01/27/2021   Medicare annual wellness visit, subsequent 01/27/2021   Needs flu shot 01/27/2021   At moderate risk for fall 01/27/2021   Prediabetes 02/19/2020   Menopause 11/25/2019   Cardiomyopathy (HCC) 11/19/2019   Rheumatoid arthritis involving multiple sites with positive rheumatoid factor (HCC) 11/19/2019   Primary osteoarthritis involving multiple joints 11/19/2019   Greater trochanteric bursitis, right 11/19/2019   Decreased activities of daily living (ADL) 11/19/2019   Gait disturbance 11/19/2019   Use of cane as ambulatory aid 11/19/2019   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    Class: Chronic   Spinal stenosis of lumbar region 04/25/2019    Class: Chronic   Status post lumbar laminectomy 04/25/2019   Chronic pain of right knee 09/13/2018   Chronic pain of left knee 07/08/2018   Chronic hip pain 07/08/2018   Edema 07/08/2018   Need for shingles vaccine 12/05/2017   Primary osteoarthritis of both hands 11/23/2017   Primary osteoarthritis of both knees 11/23/2017   Primary osteoarthritis of both feet 11/23/2017   DDD (degenerative disc disease), lumbar 11/23/2017   Vaccine counseling 08/13/2017   Sensitive skin 01/23/2017   Need for influenza vaccination 01/23/2017  History of gastrointestinal stromal tumor (GIST) 04/20/2016   History of TIA (transient ischemic attack) 04/20/2016   Screening for breast cancer 04/20/2016   Estrogen deficiency 04/20/2016   Impaired fasting blood sugar 04/20/2016   Constipation 04/20/2016    History of fall 04/20/2016   Chronic radicular lumbar pain 01/27/2016   Encounter for health maintenance examination in adult 03/22/2015   Paresthesia 03/22/2015   Cognitive decline 03/22/2015   Screening for cervical cancer 03/22/2015   Vitamin D deficiency 03/22/2015   History of uterine leiomyoma 03/22/2015   Gastroesophageal reflux disease without esophagitis 03/02/2014   Chronic nausea 03/02/2014   Rhinitis, allergic 03/02/2014   Essential hypertension 03/02/2014   Hyperlipidemia 03/02/2014   Obesity with serious comorbidity 01/16/2012    Past Medical History:  Diagnosis Date   Allergy    Anemia    iron therapy for years as of 10/12; normal Hgb 08/2013   Anxiety    Arthritis    Cardiomyopathy (HCC)    Chest pain 04/05/2011   cardiac eval, normal treadmill stress test, Dr. 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 Axel Filler, [facility]   GERD (gastroesophageal reflux disease)    History of uterine fibroid    Hyperlipidemia    Hypertension    Paresthesia 09/2014   initially thought to be TIA, neurology consult in 12/2014 with other non TIA considerations.     Polyarthralgia    normal rheumatoid screen 01/2012   TIA (transient ischemic attack) 2015/16    Family History  Problem Relation Age of Onset   Hypertension Mother    Arthritis Mother    GER disease Mother    Glaucoma Mother    Breast cancer Mother 109   Stroke Father    Breast cancer Sister 31   Hypertension Sister    Leukemia Sister    Hypertension Sister    Colon cancer Brother 4   Diabetes Brother    Diabetes Maternal Aunt    Heart disease Maternal Aunt    Heart disease Maternal Uncle    Stroke Maternal Uncle    Heart disease Maternal Grandmother    Heart disease Maternal Grandfather    Healthy Son    Colon polyps Neg Hx    Esophageal cancer Neg Hx     Stomach cancer Neg Hx    Rectal cancer Neg Hx    Past Surgical History:  Procedure Laterality Date   COLONOSCOPY  01/2014   diverticulosis, othwerise normal - Dr. Rob Bunting   DILATATION & CURETTAGE/HYSTEROSCOPY WITH MYOSURE N/A 07/14/2021   Procedure: DILATATION & CURETTAGE/HYSTEROSCOPY WITH MYOSURE Reach;  Surgeon: Shea Evans, MD;  Location: [facility];  Service: Gynecology;  Laterality: N/A;   ESOPHAGOGASTRODUODENOSCOPY  2013   Dr. Elnoria Howard, gastritis   ESOPHAGOGASTRODUODENOSCOPY  [id number]   EUS N/A 04/16/2014   Procedure: UPPER ENDOSCOPIC ULTRASOUND (EUS) LINEAR;  Surgeon: Rachael Fee, MD;  Location: [facility];  Service: Endoscopy;  Laterality: N/A;   gall stone surgery     KNEE ARTHROSCOPY Left    LAPAROSCOPIC GASTRIC RESECTION N/A 06/09/2014   Procedure: LAPAROSCOPIC GASTRIC MASS RESECTION;  Surgeon: Axel Filler, MD;  Location: [facility];  Service: General;  Laterality: N/A;   LIPOMA EXCISION     forehead   LUMBAR LAMINECTOMY N/A 04/25/2019   Procedure: LEFT L2-3 MICRODISCECTOMY, BILATERAL L5-S1 PARTIAL HEMILAMINECTOMY;  Surgeon: Kerrin Champagne, MD;  Location: [facility];  Service: Orthopedics;  Laterality: N/A;   UPPER GASTROINTESTINAL ENDOSCOPY     UTERINE FIBROID SURGERY     Social History   Social History Narrative   Married, has 1 son in [place] and some grandchildren.  Location manager.  Active on job.  Does stretching and exercises daily as per physical therapy.  Works 12 hours daily.     Immunization History  Administered Date(s) Administered   Influenza,inj,Quad PF,6+ Mos 11/24/2013, 01/18/2015, 01/23/2017, 12/05/2017, 12/17/2018, 11/19/2019, 01/27/2021   PFIZER Comirnaty(Gray Top)Covid-19 Tri-Sucrose Vaccine 07/12/2020   PFIZER(Purple Top)SARS-COV-2 Vaccination 06/16/2019, 07/08/2019, 01/15/2020   PNEUMOCOCCAL CONJUGATE-20 07/28/2021   Pneumococcal Polysaccharide-23 05/21/2015   Tdap 01/17/2011, 06/25/2021   Zoster Recombinant(Shingrix) 10/05/2017,  12/07/2017     Objective: Vital Signs: BP 108/74 (BP Location: Left Wrist, Patient Position: Sitting, Cuff Size: Normal)   Pulse 65   Resp 14   Ht 5\' 9"  (1.753 m)   Wt 235 lb 12.8 oz (107 kg)   LMP 02/23/2015 (LMP Unknown)   BMI 34.82 kg/m    Physical Exam Vitals and nursing note reviewed.  Constitutional:      Appearance: She is well-developed.  HENT:     Head: Normocephalic and atraumatic.  Eyes:     Conjunctiva/sclera: Conjunctivae normal.  Cardiovascular:     Rate and Rhythm: Normal rate and regular rhythm.     Heart sounds: Normal heart sounds.  Pulmonary:     Effort: Pulmonary effort is normal.     Breath sounds: Normal breath sounds.  Abdominal:     General: Bowel sounds are normal.     Palpations: Abdomen is soft.  Musculoskeletal:     Cervical back: Normal range of motion.  Lymphadenopathy:     Cervical: No cervical adenopathy.  Skin:    General: Skin is warm and dry.     Capillary Refill: Capillary refill takes less than 2 seconds.  Neurological:     Mental Status: She is alert and oriented to person, place, and time.  Psychiatric:        Behavior: Behavior normal.      Musculoskeletal Exam: She had limited range of motion of the cervical spine with discomfort.  Shoulder joints, elbow joints, wrist joints, MCPs PIPs and DIPs were in good range of motion.  Bilateral PIP and DIP thickening with no synovitis was noted.  Hip joints and knee joints were in good range of motion without any warmth swelling or effusion.  There was no tenderness over ankles or MTPs.  CDAI Exam: CDAI Score: 5  Patient Global: 20 / 100; Provider Global: 20 / 100 Swollen: 0 ; Tender: 2  Joint Exam 12/20/2022      Right  Left  Cervical Spine   Tender     Knee   Tender        Investigation: No additional findings.  Imaging: US ARTERIAL ABI (SCREENING LOWER EXTREMITY)  Result Date: 11/27/2022 CLINICAL DATA:  Atherosclerosis of native arteries of extremities Hypertension  EXAM: NONINVASIVE PHYSIOLOGIC VASCULAR STUDY OF BILATERAL LOWER EXTREMITIES TECHNIQUE: Evaluation of both lower extremities were performed at rest, including calculation of ankle-brachial indices with single level pressure measurements and doppler and pulse volume recording. COMPARISON:  None available. FINDINGS: Right ABI:  0.93 Right TBI: 0.65 Left ABI:  0.91 LeftTBI: 0.61 Right Lower Extremity:  Normal arterial waveforms at the ankle. Left Lower Extremity:  Normal arterial waveforms at the ankle. 0.9-0.99 Borderline PAD IMPRESSION: Ankle-brachial indices suggestive of  mild lower extremity peripheral arterial disease. Electronically Signed   By: Acquanetta Belling M.D.   On: 11/27/2022 14:45    Recent Labs: Lab Results  Component Value Date   WBC 6.3 07/18/2022   HGB 12.8 07/18/2022   PLT 303 07/18/2022   NA 144 07/18/2022   K 4.6 07/18/2022   CL 108 07/18/2022   CO2 29 07/18/2022   GLUCOSE 81 07/18/2022   BUN 11 07/18/2022   CREATININE 0.77 07/18/2022   BILITOT 0.3 07/18/2022   ALKPHOS 103 08/28/2021   AST 17 07/18/2022   ALT 15 07/18/2022   PROT 6.5 07/18/2022   ALBUMIN 3.7 08/28/2021   CALCIUM 9.1 07/18/2022   GFRAA 115 05/11/2020    Speciality Comments: PLQ Eye Exam: 10/31/2022 WNL @ [facility] Follow up in 1 year  Procedures:  No procedures performed Allergies: Kiwi extract, Mucinex dm [dm-guaifenesin er], and Peach [prunus persica]   Assessment / Plan:     Visit Diagnoses: Rheumatoid arthritis involving multiple sites with positive rheumatoid factor (HCC) - +RF, +CCP: Patient complains of pain and discomfort in multiple joints.  No synovitis was noted on the examination today.  She is on hydroxychloroquine 200 mg p.o. twice daily which she has been taking without any interruption.  No change in treatment was advised.  High risk medication use - Plaquenil 200 mg 1 tablet by mouth twice daily. PLQ Eye Exam: 10/31/2022.  She was advised to get labs today and then every 5 months to  monitor for drug toxicity.  Information on immunization was placed in the AVS.  Primary osteoarthritis of both hands-she has bilateral PIP and DIP thickening with no synovitis.  Trochanteric bursitis of both hips-she continue to have intermittent trochanteric bursitis as she has been favoring her right knee.  Primary osteoarthritis of both knees - She was previously under the care of Dr. Cleophas Dunker who has since retired.  I reviewed her previous x-rays which were consistent with mild osteoarthritis.  She continues to have discomfort walking.  Instability of right knee joint-she ambulates with the help of a cane.  She would benefit from having a walker due to instability in her knee.  A prescription for the rollator walker with a seat was given.  Walker.   I referred her to physical therapy.  No warmth or swelling or effusion was noted on the knee examination.  Primary osteoarthritis of both feet-chronic pain.  Closed avulsion fracture of left ankle with routine healing, subsequent encounter  Neck pain-she complains of pain and stiffness in her cervical spine.  X-rays of the cervical spine showed multilevel spondylosis with anterior osteophytes and facet joint arthropathy.  X-ray findings were reviewed with the patient.  I will refer her to physical therapy.  A handout for neck exercises was given.  DDD (degenerative disc disease), lumbar-she is phonic lower back pain.  Osteopenia of left hip - DEXA updated on 08/05/21: The BMD measured at Femur Neck Left is 0.868 g/cm2 with a T-score of -1.2.  Use of calcium rich diet and vitamin D was advised.  Other medical problems are listed as follows:  History of hyperlipidemia  Essential hypertension-blood pressure was normal 108/74.  History of TIA (transient ischemic attack)  Vitamin D deficiency  Gastroesophageal reflux disease without esophagitis  Ganglion cyst of volar aspect of left wrist  Orders: Orders Placed This Encounter  Procedures    XR Cervical Spine 2 or 3 views   CBC with Differential/Platelet   COMPLETE METABOLIC PANEL WITH GFR  No orders of the defined types were placed in this encounter.    Follow-Up Instructions: Return in about 4 weeks (around 01/17/2023) for Rheumatoid arthritis, Osteoarthritis.   Pollyann Savoy, MD  Note - This record has been created using Animal nutritionist.  Chart creation errors have been sought, but may not always  have been located. Such creation errors do not reflect on  the standard of medical care.

## 2022-12-20 ENCOUNTER — Ambulatory Visit (INDEPENDENT_AMBULATORY_CARE_PROVIDER_SITE_OTHER): Payer: Medicare HMO

## 2022-12-20 ENCOUNTER — Ambulatory Visit: Payer: Medicare HMO | Attending: Rheumatology | Admitting: Rheumatology

## 2022-12-20 ENCOUNTER — Encounter: Payer: Self-pay | Admitting: Rheumatology

## 2022-12-20 VITALS — BP 108/74 | HR 65 | Resp 14 | Ht 69.0 in | Wt 235.8 lb

## 2022-12-20 DIAGNOSIS — M0579 Rheumatoid arthritis with rheumatoid factor of multiple sites without organ or systems involvement: Secondary | ICD-10-CM

## 2022-12-20 DIAGNOSIS — M19041 Primary osteoarthritis, right hand: Secondary | ICD-10-CM

## 2022-12-20 DIAGNOSIS — M542 Cervicalgia: Secondary | ICD-10-CM | POA: Diagnosis not present

## 2022-12-20 DIAGNOSIS — M5136 Other intervertebral disc degeneration, lumbar region: Secondary | ICD-10-CM

## 2022-12-20 DIAGNOSIS — M25361 Other instability, right knee: Secondary | ICD-10-CM

## 2022-12-20 DIAGNOSIS — M170 Bilateral primary osteoarthritis of knee: Secondary | ICD-10-CM

## 2022-12-20 DIAGNOSIS — Z8639 Personal history of other endocrine, nutritional and metabolic disease: Secondary | ICD-10-CM

## 2022-12-20 DIAGNOSIS — Z8673 Personal history of transient ischemic attack (TIA), and cerebral infarction without residual deficits: Secondary | ICD-10-CM

## 2022-12-20 DIAGNOSIS — M19042 Primary osteoarthritis, left hand: Secondary | ICD-10-CM

## 2022-12-20 DIAGNOSIS — K219 Gastro-esophageal reflux disease without esophagitis: Secondary | ICD-10-CM

## 2022-12-20 DIAGNOSIS — S82892D Other fracture of left lower leg, subsequent encounter for closed fracture with routine healing: Secondary | ICD-10-CM

## 2022-12-20 DIAGNOSIS — M67432 Ganglion, left wrist: Secondary | ICD-10-CM

## 2022-12-20 DIAGNOSIS — M7061 Trochanteric bursitis, right hip: Secondary | ICD-10-CM | POA: Diagnosis not present

## 2022-12-20 DIAGNOSIS — I10 Essential (primary) hypertension: Secondary | ICD-10-CM

## 2022-12-20 DIAGNOSIS — Z79899 Other long term (current) drug therapy: Secondary | ICD-10-CM

## 2022-12-20 DIAGNOSIS — E559 Vitamin D deficiency, unspecified: Secondary | ICD-10-CM

## 2022-12-20 DIAGNOSIS — M7062 Trochanteric bursitis, left hip: Secondary | ICD-10-CM

## 2022-12-20 DIAGNOSIS — M85852 Other specified disorders of bone density and structure, left thigh: Secondary | ICD-10-CM

## 2022-12-20 DIAGNOSIS — M19071 Primary osteoarthritis, right ankle and foot: Secondary | ICD-10-CM

## 2022-12-20 DIAGNOSIS — M19072 Primary osteoarthritis, left ankle and foot: Secondary | ICD-10-CM

## 2022-12-20 NOTE — Addendum Note (Signed)
Addended by: Geroge Baseman C on: 12/20/2022 01:08 PM   Modules accepted: Orders

## 2022-12-20 NOTE — Patient Instructions (Addendum)
Exercises for Chronic Knee Pain Chronic knee pain is pain that lasts longer than 3 months. For most people with chronic knee pain, exercise and weight loss is an important part of treatment. Your health care provider may want you to focus on: Making the muscles that support your knee stronger. This can take pressure off your knee and reduce pain. Preventing knee stiffness. How far you can move your knee, keeping it there or making it farther. Losing weight (if this applies) to take pressure off your knee, lower your risk for injury, and make it easier for you to exercise. Your provider will help you make an exercise program that fits your needs and physical abilities. Below are simple, low-impact exercises you can do at home. Ask your provider or physical therapist how often you should do your exercise program and how many times to repeat each exercise. General safety tips  Get your provider's approval before doing any exercises. Start slowly and stop any time you feel pain. Do not exercise if your knee pain is flaring up. Warm up first. Stretching a cold muscle can cause an injury. Do 5-10 minutes of easy movement or light stretching before beginning your exercises. Do 5-10 minutes of low-impact activity (like walking or cycling) before starting strengthening exercises. Contact your provider any time you have pain during or after exercising. Exercise can cause discomfort but should not be painful. It is normal to be a little stiff or sore after exercising. Stretching and range-of-motion exercises Front thigh stretch  Stand up straight and support your body by holding on to a chair or resting one hand on a wall. With your legs straight and close together, bend one knee to lift your heel up toward your butt. Using one hand for support, grab your ankle with your free hand. Pull your foot up closer toward your butt to feel the stretch in front of your thigh. Hold the stretch for 30  seconds. Repeat __________ times. Complete this exercise __________ times a day. Back thigh stretch  Sit on the floor with your back straight and your legs out straight in front of you. Place the palms of your hands on the floor and slide them toward your feet as you bend at the hip. Try to touch your nose to your knees and feel the stretch in the back of your thighs. Hold for 30 seconds. Repeat __________ times. Complete this exercise __________ times a day. Calf stretch  Stand facing a wall. Place the palms of your hands flat against the wall, arms extended, and lean slightly against the wall. Get into a lunge position with one leg bent at the knee and the other leg stretched out straight behind you. Keep both feet facing the wall and increase the bend in your knee while keeping the heel of the other leg flat on the ground. You should feel the stretch in your calf. Hold for 30 seconds. Repeat __________ times. Complete this exercise __________ times a day. Strengthening exercises Straight leg lift  Lie on your back with one knee bent and the other leg out straight. Slowly lift the straight leg without bending the knee. Lift until your foot is about 12 inches (30 cm) off the floor. Hold for 3-5 seconds and slowly lower your leg. Repeat __________ times. Complete this exercise __________ times a day. Single leg dip  Stand between two chairs and put both hands on the backs of the chairs for support. Extend one leg out straight with your body  weight resting on the heel of the standing leg. Slowly bend your standing knee to dip your body to the level that is comfortable for you. Hold for 3-5 seconds. Repeat __________ times. Complete this exercise __________ times a day. Hamstring curls  Stand straight, knees close together, facing the back of a chair. Hold on to the back of a chair with both hands. Keep one leg straight. Bend the other knee while bringing the heel up toward the butt  until the knee is bent at a 90-degree angle (right angle). Hold for 3-5 seconds. Repeat __________ times. Complete this exercise __________ times a day. Wall squat  Stand straight with your back, hips, and head against a wall. Step forward one foot at a time with your back still against the wall. Your feet should be 2 feet (61 cm) from the wall at shoulder width. Keeping your back, hips, and head against the wall, slide down the wall to as close to a sitting position as you can get. Hold for 5-10 seconds, then slowly slide back up. Repeat __________ times. Complete this exercise __________ times a day. Step-ups  Stand in front of a sturdy platform or stool that is about 6 inches (15 cm) high. Slowly step up with your left / right foot, keeping your knee in line with your hip and foot. Do not let your knee bend so far that you cannot see your toes. Hold on to a chair for balance, but do not use it for support. Slowly unlock your knee and lower yourself to the starting position. Repeat __________ times. Complete this exercise __________ times a day. Contact a health care provider if: Your exercises cause pain. Your pain is worse after you exercise. Your pain prevents you from doing your exercises. This information is not intended to replace advice given to you by your health care provider. Make sure you discuss any questions you have with your health care provider. Document Revised: 04/04/2022 Document Reviewed: 04/04/2022 Elsevier Patient Education  2024 [employer]   Cervical Strain and Sprain Rehab Ask your health care provider which exercises are safe for you. Do exercises exactly as told by your health care provider and adjust them as directed. It is normal to feel mild stretching, pulling, tightness, or discomfort as you do these exercises. Stop right away if you feel sudden pain or your pain gets worse. Do not begin these exercises until told by your health care  provider. Stretching and range-of-motion exercises Cervical side bending  Using good posture, sit on a stable chair or stand up. Without moving your shoulders, slowly tilt your left / right ear to your shoulder until you feel a stretch in the neck muscles on the opposite side. You should be looking straight ahead. Hold for __________ seconds. Repeat with the other side of your neck. Repeat __________ times. Complete this exercise __________ times a day. Cervical rotation  Using good posture, sit on a stable chair or stand up. Slowly turn your head to the side as if you are looking over your left / right shoulder. Keep your eyes level with the ground. Stop when you feel a stretch along the side and the back of your neck. Hold for __________ seconds. Repeat this by turning to your other side. Repeat __________ times. Complete this exercise __________ times a day. Thoracic extension and pectoral stretch  Roll a towel or a small blanket so it is about 4 inches (10 cm) in diameter. Lie down on your back on  a firm surface. Put the towel in the middle of your back across your spine. It should not be under your shoulder blades. Put your hands behind your head and let your elbows fall out to your sides. Hold for __________ seconds. Repeat __________ times. Complete this exercise __________ times a day. Strengthening exercises Upper cervical flexion  Lie on your back with a thin pillow behind your head or a small, rolled-up towel under your neck. Gently tuck your chin toward your chest and nod your head down to look toward your feet. Do not lift your head off the pillow. Hold for __________ seconds. Release the tension slowly. Relax your neck muscles completely before you repeat this exercise. Repeat __________ times. Complete this exercise __________ times a day. Cervical extension  Stand about 6 inches (15 cm) away from a wall, with your back facing the wall. Place a soft object, about  6-8 inches (15-20 cm) in diameter, between the back of your head and the wall. A soft object could be a small pillow, a ball, or a folded towel. Gently tilt your head back and press into the soft object. Keep your jaw and forehead relaxed. Hold for __________ seconds. Release the tension slowly. Relax your neck muscles completely before you repeat this exercise. Repeat __________ times. Complete this exercise __________ times a day. Posture and body mechanics Body mechanics refer to the movements and positions of your body while you do your daily activities. Posture is part of body mechanics. Good posture and healthy body mechanics can help to relieve stress in your body's tissues and joints. Good posture means that your spine is in its natural S-curve position (your spine is neutral), your shoulders are pulled back slightly, and your head is not tipped forward. The following are general guidelines for using improved posture and body mechanics in your everyday activities. Sitting  When sitting, keep your spine neutral and keep your feet flat on the floor. Use a footrest, if needed, and keep your thighs parallel to the floor. Avoid rounding your shoulders. Avoid tilting your head forward. When working at a desk or a computer, keep your desk at a height where your hands are slightly lower than your elbows. Slide your chair under your desk so you are close enough to maintain good posture. When working at a computer, place your monitor at a height where you are looking straight ahead and you do not have to tilt your head forward or downward to look at the screen. Standing  When standing, keep your spine neutral and keep your feet about hip-width apart. Keep a slight bend in your knees. Your ears, shoulders, and hips should line up. When you do a task in which you stand in one place for a long time, place one foot up on a stable object that is 2-4 inches (5-10 cm) high, such as a footstool. This helps  keep your spine neutral. Resting When lying down and resting, avoid positions that are most painful for you. Try to support your neck in a neutral position. You can use a contour pillow or a small rolled-up towel. Your pillow should support your neck but not push on it. This information is not intended to replace advice given to you by your health care provider. Make sure you discuss any questions you have with your health care provider. Document Revised: 07/24/2022 Document Reviewed: 10/10/2021 Elsevier Patient Education  2024 [employer]  Vaccines You are taking a medication(s) that can suppress your immune system.  The following immunizations are recommended: Flu annually Covid-19  RSV Td/Tdap (tetanus, diphtheria, pertussis) every 10 years Pneumonia (Prevnar 15 then Pneumovax 23 at least 1 year apart.  Alternatively, can take Prevnar 20 without needing additional dose) Shingrix: 2 doses from 4 weeks to 6 months apart  Please check with your PCP to make sure you are up to date.

## 2022-12-21 ENCOUNTER — Telehealth: Payer: Self-pay | Admitting: Rheumatology

## 2022-12-21 LAB — CBC WITH DIFFERENTIAL/PLATELET
Absolute Monocytes: 467 cells/uL (ref 200–950)
Basophils Absolute: 32 cells/uL (ref 0–200)
Basophils Relative: 0.5 %
Eosinophils Absolute: 122 cells/uL (ref 15–500)
Eosinophils Relative: 1.9 %
HCT: 41.6 % (ref 35.0–45.0)
Hemoglobin: 12.8 g/dL (ref 11.7–15.5)
Lymphs Abs: 2592 cells/uL (ref 850–3900)
MCH: 27.2 pg (ref 27.0–33.0)
MCHC: 30.8 g/dL — ABNORMAL LOW (ref 32.0–36.0)
MCV: 88.3 fL (ref 80.0–100.0)
MPV: 9.9 fL (ref 7.5–12.5)
Monocytes Relative: 7.3 %
Neutro Abs: 3187 cells/uL (ref 1500–7800)
Neutrophils Relative %: 49.8 %
Platelets: 298 10*3/uL (ref 140–400)
RBC: 4.71 10*6/uL (ref 3.80–5.10)
RDW: 13 % (ref 11.0–15.0)
Total Lymphocyte: 40.5 %
WBC: 6.4 10*3/uL (ref 3.8–10.8)

## 2022-12-21 LAB — COMPLETE METABOLIC PANEL WITH GFR
AG Ratio: 1.5 (calc) (ref 1.0–2.5)
ALT: 17 U/L (ref 6–29)
AST: 20 U/L (ref 10–35)
Albumin: 4 g/dL (ref 3.6–5.1)
Alkaline phosphatase (APISO): 106 U/L (ref 37–153)
BUN: 13 mg/dL (ref 7–25)
CO2: 31 mmol/L (ref 20–32)
Calcium: 9.6 mg/dL (ref 8.6–10.4)
Chloride: 105 mmol/L (ref 98–110)
Creat: 0.76 mg/dL (ref 0.50–1.05)
Globulin: 2.7 g/dL (calc) (ref 1.9–3.7)
Glucose, Bld: 81 mg/dL (ref 65–99)
Potassium: 4.7 mmol/L (ref 3.5–5.3)
Sodium: 142 mmol/L (ref 135–146)
Total Bilirubin: 0.3 mg/dL (ref 0.2–1.2)
Total Protein: 6.7 g/dL (ref 6.1–8.1)
eGFR: 90 mL/min/{1.73_m2} (ref >=60)

## 2022-12-21 NOTE — Telephone Encounter (Signed)
[facility] called requesting face to face notes, demographics, narrative from the Dr. on why they think she needs the roller. Fax number is [phone]

## 2022-12-21 NOTE — Telephone Encounter (Signed)
Demographics and office note faxed.

## 2022-12-21 NOTE — Telephone Encounter (Signed)
Abby from [facility] called stating the pt called them about having a prescription sent over. They will need the prescription and office notes on mobility device sent by fax number [phone].

## 2022-12-21 NOTE — Progress Notes (Signed)
CBC and CMP normal

## 2022-12-21 NOTE — Telephone Encounter (Signed)
Faxed prescription for rolling walker as requested.   Attempted to contact the patient and unable to leave a voicemail.

## 2022-12-21 NOTE — Telephone Encounter (Signed)
Pt called requesting prescription sent to [facility] fax is [phone] phone number is [phone].Pt would like a call back.

## 2022-12-22 NOTE — Telephone Encounter (Signed)
Faxed prescription and office notes

## 2022-12-22 NOTE — Telephone Encounter (Signed)
Patient advised we have faxed her prescription for her walker.

## 2022-12-25 ENCOUNTER — Telehealth: Payer: Self-pay | Admitting: Rheumatology

## 2022-12-25 NOTE — Telephone Encounter (Signed)
Patient states she does not have a walker. Patient states she only uses a cane. Patient states her note should state she will benefit from a rolater walker.

## 2022-12-25 NOTE — Telephone Encounter (Signed)
Pt would like the nurse to call her back as soon as possible. Pt states the wording was wrong in the notes that were faxed over. She does not have a walker but just a cane.

## 2022-12-25 NOTE — Telephone Encounter (Signed)
Note has been addended.

## 2022-12-25 NOTE — Telephone Encounter (Signed)
[facility] called again and advised they needed notes for the patient's Rolator. Advised the office notes have already been faxed over.

## 2022-12-26 NOTE — Telephone Encounter (Signed)
Patient advised the note has been addend. Patient advised faxing new note over to Abby.

## 2022-12-27 ENCOUNTER — Ambulatory Visit
Admission: RE | Admit: 2022-12-27 | Discharge: 2022-12-27 | Disposition: A | Discharge status: Home / Self Care | Payer: Medicare HMO | Source: Ambulatory Visit | Attending: Family Medicine | Admitting: Family Medicine

## 2022-12-27 DIAGNOSIS — Z1231 Encounter for screening mammogram for malignant neoplasm of breast: Secondary | ICD-10-CM

## 2023-01-02 ENCOUNTER — Encounter: Payer: Self-pay | Admitting: Physical Therapy

## 2023-01-02 ENCOUNTER — Other Ambulatory Visit: Payer: Self-pay

## 2023-01-02 ENCOUNTER — Ambulatory Visit (INDEPENDENT_AMBULATORY_CARE_PROVIDER_SITE_OTHER): Payer: Medicare HMO | Admitting: Physical Therapy

## 2023-01-02 DIAGNOSIS — M25561 Pain in right knee: Secondary | ICD-10-CM | POA: Diagnosis not present

## 2023-01-02 DIAGNOSIS — M542 Cervicalgia: Secondary | ICD-10-CM

## 2023-01-02 DIAGNOSIS — G8929 Other chronic pain: Secondary | ICD-10-CM | POA: Diagnosis not present

## 2023-01-02 DIAGNOSIS — M6281 Muscle weakness (generalized): Secondary | ICD-10-CM | POA: Diagnosis not present

## 2023-01-02 NOTE — Therapy (Signed)
OUTPATIENT PHYSICAL THERAPY EVALUATION   Patient Name: SHINIQUE FOHL MRN: [id number] DOB:10/06/1960, 62 y.o., female Today's Date: 01/02/2023  END OF SESSION:  PT End of Session - 01/02/23 0929     Visit Number 1    Number of Visits 16    Date for PT Re-Evaluation 02/27/23    Authorization Type [employer] $20 copay    Progress Note Due on Visit 10    PT Start Time 0930    PT Stop Time 1007    PT Time Calculation (min) 37 min    Activity Tolerance Patient tolerated treatment well    Behavior During Therapy [facility] for tasks assessed/performed             Past Medical History:  Diagnosis Date   Allergy    Anemia    iron therapy for years as of 10/12; normal Hgb 08/2013   Anxiety    Arthritis    Cardiomyopathy (HCC)    Chest pain 04/05/2011   cardiac eval, normal treadmill stress test, Dr. 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 Axel Filler, [facility]   GERD (gastroesophageal reflux disease)    History of uterine fibroid    Hyperlipidemia    Hypertension    Paresthesia 09/2014   initially thought to be TIA, neurology consult in 12/2014 with other non TIA considerations.     Polyarthralgia    normal rheumatoid screen 01/2012   TIA (transient ischemic attack) 2015/16   Past Surgical History:  Procedure Laterality Date   COLONOSCOPY  01/2014   diverticulosis, othwerise normal - Dr. Rob Bunting   DILATATION & CURETTAGE/HYSTEROSCOPY WITH MYOSURE N/A 07/14/2021   Procedure: DILATATION & CURETTAGE/HYSTEROSCOPY WITH MYOSURE Reach;  Surgeon: Shea Evans, MD;  Location: [facility];  Service: Gynecology;  Laterality: N/A;   ESOPHAGOGASTRODUODENOSCOPY  2013   Dr. Elnoria Howard, gastritis   ESOPHAGOGASTRODUODENOSCOPY  [id number]   EUS N/A 04/16/2014   Procedure: UPPER ENDOSCOPIC ULTRASOUND (EUS) LINEAR;  Surgeon: Rachael Fee, MD;  Location: [facility];  Service: Endoscopy;  Laterality: N/A;   gall stone surgery     KNEE ARTHROSCOPY Left    LAPAROSCOPIC GASTRIC RESECTION N/A 06/09/2014   Procedure: LAPAROSCOPIC GASTRIC MASS RESECTION;  Surgeon: Axel Filler, MD;  Location: [facility];  Service: General;  Laterality: N/A;   LIPOMA EXCISION     forehead   LUMBAR LAMINECTOMY N/A 04/25/2019   Procedure: LEFT L2-3 MICRODISCECTOMY, BILATERAL L5-S1 PARTIAL HEMILAMINECTOMY;  Surgeon: Kerrin Champagne, MD;  Location: [facility];  Service: Orthopedics;  Laterality: N/A;   UPPER GASTROINTESTINAL ENDOSCOPY     UTERINE FIBROID SURGERY     Patient Active Problem List   Diagnosis Date Noted   Unilateral primary osteoarthritis, right knee 05/04/2022   Bilateral ankle pain 09/15/2021   Chronic HFrEF (heart failure with reduced ejection fraction) (HCC) 07/29/2021   Advance directive discussed with patient 01/27/2021   Medicare annual wellness visit, subsequent 01/27/2021   Needs flu shot 01/27/2021   At moderate risk for fall 01/27/2021   Prediabetes 02/19/2020   Menopause 11/25/2019   Cardiomyopathy (HCC) 11/19/2019   Rheumatoid arthritis involving multiple sites with positive rheumatoid factor (HCC) 11/19/2019   Primary osteoarthritis involving multiple joints 11/19/2019   Greater trochanteric bursitis, right 11/19/2019   Decreased activities of daily living (ADL) 11/19/2019   Gait disturbance  11/19/2019   Use of cane as ambulatory aid 11/19/2019   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    Class: Chronic   Spinal stenosis of lumbar region 04/25/2019    Class: Chronic   Status post lumbar laminectomy 04/25/2019   Chronic pain of right knee 09/13/2018   Chronic pain of left knee 07/08/2018   Chronic hip pain 07/08/2018   Edema 07/08/2018   Need for shingles vaccine 12/05/2017   Primary osteoarthritis of both hands 11/23/2017   Primary osteoarthritis of both knees 11/23/2017   Primary osteoarthritis of  both feet 11/23/2017   DDD (degenerative disc disease), lumbar 11/23/2017   Vaccine counseling 08/13/2017   Sensitive skin 01/23/2017   Need for influenza vaccination 01/23/2017   History of gastrointestinal stromal tumor (GIST) 04/20/2016   History of TIA (transient ischemic attack) 04/20/2016   Screening for breast cancer 04/20/2016   Estrogen deficiency 04/20/2016   Impaired fasting blood sugar 04/20/2016   Constipation 04/20/2016   History of fall 04/20/2016   Chronic radicular lumbar pain 01/27/2016   Encounter for health maintenance examination in adult 03/22/2015   Paresthesia 03/22/2015   Cognitive decline 03/22/2015   Screening for cervical cancer 03/22/2015   Vitamin D deficiency 03/22/2015   History of uterine leiomyoma 03/22/2015   Gastroesophageal reflux disease without esophagitis 03/02/2014   Chronic nausea 03/02/2014   Rhinitis, allergic 03/02/2014   Essential hypertension 03/02/2014   Hyperlipidemia 03/02/2014   Obesity with serious comorbidity 01/16/2012    PCP: Ellyn Hack, MD  REFERRING PROVIDER: Pollyann Savoy, MD  REFERRING DIAG: M25.361 (ICD-10-CM) - Instability of right knee joint M54.2 (ICD-10-CM) - Neck pain  Rationale for Evaluation and Treatment: Rehabilitation  THERAPY DIAG:  Chronic pain of right knee - Plan: PT plan of care cert/re-cert  Muscle weakness (generalized) - Plan: PT plan of care cert/re-cert  Cervicalgia - Plan: PT plan of care cert/re-cert  ONSET DATE: chronic (date of referral: 12/20/22)   SUBJECTIVE:                                                                                                                                                                                           SUBJECTIVE STATEMENT: Pt complains of Rt sided neck pain, as well as Rt knee pain.  She has known advanced OA in her Rt knee.  She reports she can't sit for long periods or walk for long periods.  PERTINENT HISTORY:  Anemia, anxiety,  arthritis, cardiomyopathy, HLD, HTN, spinal stenosis, lumbar laminectomy  PAIN:  Are you having pain? Yes: NPRS scale: 0 currently, up to 10/10 Pain location: Rt neck down arm and  side; to Rt knee Pain description: sharp, shooting, dull, aching Aggravating factors: sitting, walking, lifting groceries Relieving factors: forward bending, "I rub down" and medications   PRECAUTIONS:  None  RED FLAGS: None   WEIGHT BEARING RESTRICTIONS:  No  FALLS:  Has patient fallen in last 6 months? No  LIVING ENVIRONMENT: Lives with: lives with their family (husband and adult son) Lives in: House/apartment Stairs: No Has following equipment at home: Single point cane and has a rollator ordered  OCCUPATION:  On disability  PLOF:  Independent and Leisure: watch TV/phone, does some stretching  PATIENT GOALS:  "I want to be back to 100%"  OBJECTIVE:   DIAGNOSTIC FINDINGS:  Advanced OA in Rt knee  PATIENT SURVEYS:  01/02/23 FOTO 31 (predicted 35)  COGNITIVE STATUS: Within functional limits for tasks assessed   SENSATION: WFL  POSTURE:  rounded shoulders, forward head, and increased lumbar lordosis  HAND DOMINANCE:  Right  GAIT: 01/02/23 Distance walked: 100 Assistive device utilized: Single point cane Level of assistance: Modified independence Comments: antalgic gait; decreased stance on Rt (using cane in Rt hand)   PALPATION: 01/02/23: diffuse tenderness along Rt upper traps and shoulder  CERVICAL ROM:   Active ROM A/PROM (deg) eval  Flexion 48  Extension 38  Right lateral flexion 15  Left lateral flexion 30 (with Rt side pulling)  Right rotation   Left rotation    (Blank rows = not tested)   UPPER EXTREMITY ROM: 01/02/23: bil UE ROM WNL just pain with all motions   UPPER EXTREMITY MMT:  MMT Right eval Left eval  Shoulder flexion 3/5 3+/5  Shoulder extension    Shoulder abduction 3/5 3+/5  Shoulder adduction    Shoulder extension    Shoulder  internal rotation 4/5 5/5  Shoulder external rotation 4/5 4/5  Middle trapezius    Lower trapezius    Elbow flexion    Elbow extension    Wrist flexion    Wrist extension    Wrist ulnar deviation    Wrist radial deviation    Wrist pronation    Wrist supination    Grip strength     (Blank rows = not tested)   LOWER EXTREMITY ROM:     Active  Right eval Left eval  Hip flexion    Hip extension    Hip abduction    Hip adduction    Hip internal rotation    Hip external rotation    Knee flexion    Knee extension -1 0  Ankle dorsiflexion    Ankle plantarflexion    Ankle inversion    Ankle eversion     (Blank rows = not tested)   LOWER EXTREMITY MMT:    MMT Right eval Left eval  Hip flexion 4/5 5/5  Hip extension    Hip abduction 3/5 4/5  Hip adduction 4/5 5/5  Hip internal rotation    Hip external rotation    Knee flexion 4/5 5/5  Knee extension 4/5 5/5  Ankle dorsiflexion    Ankle plantarflexion    Ankle inversion    Ankle eversion     (Blank rows = not tested)    SPECIAL TESTS:  01/02/23 Cervical Spurling's test: Negative and Distraction test: Negative  FUNCTIONAL TESTS:  01/02/23 5 times sit to stand: 49.97 sec without UE support   TREATMENT:  DATE:  01/02/23 See HEP - reviewed with trial reps performed PRN for comprehension     PATIENT EDUCATION:  Education details: HEP, aquatics Person educated: Patient Education method: Programmer, multimedia, Facilities manager, and Handouts Education comprehension: verbalized understanding, returned demonstration, and needs further education  HOME EXERCISE PROGRAM: Access Code: [email or web address] URL: [email or web address] Date: 01/02/2023 Prepared by: Moshe Cipro  Exercises - Seated Neck AROM Half Circles  - 1 x daily - 7 x weekly - 1 sets - 10 reps - Seated Cervical Rotation AROM  -  1 x daily - 7 x weekly - 1 sets - 10 reps - 5 sec hold - Supine Chin Tuck  - 1 x daily - 7 x weekly - 1 sets - 10 reps - 5 sec hold - Seated Knee Extension AROM  - 1 x daily - 7 x weekly - 1 sets - 10 reps - Seated Straight Leg Raise  - 1 x daily - 7 x weekly - 1 sets - 10 reps - Sit to Stand  - 1 x daily - 7 x weekly - 1 sets - 10 reps - Sidelying Thoracic Rotation with Open Book  - 1 x daily - 7 x weekly - 1 sets - 10 reps -  sec hold  Patient Education - [facility]   ASSESSMENT:  CLINICAL IMPRESSION: Patient is a 62 y.o. female who was seen today for physical therapy evaluation and treatment for neck pain and knee pain. She demonstrates decreased strength and balance, as well as continued pain and gait abnormalities affecting functional mobility.  She will benefit from PT to address deficits listed.     OBJECTIVE IMPAIRMENTS: Abnormal gait, decreased activity tolerance, decreased balance, decreased endurance, decreased mobility, difficulty walking, decreased ROM, decreased strength, impaired UE functional use, postural dysfunction, and pain.   ACTIVITY LIMITATIONS: carrying, lifting, bending, sitting, standing, squatting, sleeping, transfers, bed mobility, bathing, toileting, dressing, hygiene/grooming, and locomotion level  PARTICIPATION LIMITATIONS: meal prep, cleaning, laundry, driving, shopping, and community activity  PERSONAL FACTORS: Age, Past/current experiences, Time since onset of injury/illness/exacerbation, and 3+ comorbidities: Anemia, anxiety, arthritis, cardiomyopathy, HLD, HTN, spinal stenosis, lumbar laminectomy  are also affecting patient's functional outcome.   REHAB POTENTIAL: Fair multiple co morbidities, several episodes of prior PT  CLINICAL DECISION MAKING: Evolving/moderate complexity  EVALUATION COMPLEXITY: Moderate   GOALS: Goals reviewed with patient? Yes  SHORT TERM GOALS: Target date: 01/30/2023  Independent with initial HEP Goal status:  INITIAL  2.  Improve 5x STS to < 42 sec for improved function and mobility Goal status: INITIAL  3.  Rt cervical sidebending improved to at least 20 deg for improved function Goal status: INITIAL   LONG TERM GOALS: Target date: 02/27/2023    Independent with final HEP Goal status: INITIAL  2.  FOTO score improved to 35 Goal status: INITIAL  3.  5x sit to stand improved to < 35 sec for improved functional strength and mobility Goal status: INIITAL  4.  Report pain < 4/10 with standing and walking for improved function Goal status: INITIAL  5.  Rt cervical side bending improved to at least 25 deg for improved mobility Goal status: INITIAL   PLAN:  PT FREQUENCY: 1-2x/week  PT DURATION: 8 weeks  PLANNED INTERVENTIONS: Therapeutic exercises, Therapeutic activity, Neuromuscular re-education, Balance training, Gait training, Patient/Family education, Self Care, Joint mobilization, DME instructions, Aquatic Therapy, Dry Needling, Electrical stimulation, Spinal manipulation, Spinal mobilization, Cryotherapy, Moist heat, Taping, Traction, Manual therapy, and Re-evaluation.  PLAN FOR NEXT SESSION: review HEP, aquatics  for LE strengthening and any UE exercises you can do too, manual/modalities PRN   NEXT MD VISIT: PRN   Clarita Crane, PT, DPT 01/02/23 12:16 PM

## 2023-01-05 ENCOUNTER — Encounter: Payer: Self-pay | Admitting: Physical Therapy

## 2023-01-05 ENCOUNTER — Ambulatory Visit (INDEPENDENT_AMBULATORY_CARE_PROVIDER_SITE_OTHER): Payer: Medicare HMO | Admitting: Physical Therapy

## 2023-01-05 DIAGNOSIS — M6281 Muscle weakness (generalized): Secondary | ICD-10-CM

## 2023-01-05 DIAGNOSIS — M25561 Pain in right knee: Secondary | ICD-10-CM

## 2023-01-05 DIAGNOSIS — G8929 Other chronic pain: Secondary | ICD-10-CM

## 2023-01-05 DIAGNOSIS — M542 Cervicalgia: Secondary | ICD-10-CM

## 2023-01-05 NOTE — Therapy (Signed)
OUTPATIENT PHYSICAL THERAPY TREATMENT   Patient Name: Lisa Briggs MRN: [id number] DOB:[birth year], 62 y.o., female Today's Date: 01/05/2023  END OF SESSION:  PT End of Session - 01/05/23 1028     Visit Number 2    Number of Visits 16    Date for PT Re-Evaluation 02/27/23    Authorization Type [employer] $20 copay    Progress Note Due on Visit 10    PT Start Time 1024    PT Stop Time 1103    PT Time Calculation (min) 39 min    Activity Tolerance Patient tolerated treatment well    Behavior During Therapy [facility] for tasks assessed/performed              Past Medical History:  Diagnosis Date   Allergy    Anemia    iron therapy for years as of 10/12; normal Hgb 08/2013   Anxiety    Arthritis    Cardiomyopathy (HCC)    Chest pain 04/05/2011   cardiac eval, normal treadmill stress test, Dr. 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 Axel Filler, [facility]   GERD (gastroesophageal reflux disease)    History of uterine fibroid    Hyperlipidemia    Hypertension    Paresthesia 09/2014   initially thought to be TIA, neurology consult in 12/2014 with other non TIA considerations.     Polyarthralgia    normal rheumatoid screen 01/2012   TIA (transient ischemic attack) 2015/16   Past Surgical History:  Procedure Laterality Date   COLONOSCOPY  01/2014   diverticulosis, othwerise normal - Dr. Rob Bunting   DILATATION & CURETTAGE/HYSTEROSCOPY WITH MYOSURE N/A 07/14/2021   Procedure: DILATATION & CURETTAGE/HYSTEROSCOPY WITH MYOSURE Reach;  Surgeon: Shea Evans, MD;  Location: [facility];  Service: Gynecology;  Laterality: N/A;   ESOPHAGOGASTRODUODENOSCOPY  2013   Dr. Elnoria Howard, gastritis   ESOPHAGOGASTRODUODENOSCOPY  [id number]   EUS N/A 04/16/2014   Procedure: UPPER ENDOSCOPIC ULTRASOUND (EUS) LINEAR;  Surgeon: Rachael Fee, MD;  Location: [facility];  Service: Endoscopy;  Laterality: N/A;   gall stone surgery     KNEE ARTHROSCOPY Left    LAPAROSCOPIC GASTRIC RESECTION N/A 06/09/2014   Procedure: LAPAROSCOPIC GASTRIC MASS RESECTION;  Surgeon: Axel Filler, MD;  Location: [facility];  Service: General;  Laterality: N/A;   LIPOMA EXCISION     forehead   LUMBAR LAMINECTOMY N/A 04/25/2019   Procedure: LEFT L2-3 MICRODISCECTOMY, BILATERAL L5-S1 PARTIAL HEMILAMINECTOMY;  Surgeon: Kerrin Champagne, MD;  Location: [facility];  Service: Orthopedics;  Laterality: N/A;   UPPER GASTROINTESTINAL ENDOSCOPY     UTERINE FIBROID SURGERY     Patient Active Problem List   Diagnosis Date Noted   Unilateral primary osteoarthritis, right knee 05/04/2022   Bilateral ankle pain 09/15/2021   Chronic HFrEF (heart failure with reduced ejection fraction) (HCC) 07/29/2021   Advance directive discussed with patient 01/27/2021   Medicare annual wellness visit, subsequent 01/27/2021   Needs flu shot 01/27/2021   At moderate risk for fall 01/27/2021   Prediabetes 02/19/2020   Menopause 11/25/2019   Cardiomyopathy (HCC) 11/19/2019   Rheumatoid arthritis involving multiple sites with positive rheumatoid factor (HCC) 11/19/2019   Primary osteoarthritis involving multiple joints 11/19/2019   Greater trochanteric bursitis, right 11/19/2019   Decreased activities of daily living (ADL) 11/19/2019   Gait  disturbance 11/19/2019   Use of cane as ambulatory aid 11/19/2019   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    Class: Chronic   Spinal stenosis of lumbar region 04/25/2019    Class: Chronic   Status post lumbar laminectomy 04/25/2019   Chronic pain of right knee 09/13/2018   Chronic pain of left knee 07/08/2018   Chronic hip pain 07/08/2018   Edema 07/08/2018   Need for shingles vaccine 12/05/2017   Primary osteoarthritis of both hands 11/23/2017   Primary osteoarthritis of both knees 11/23/2017   Primary osteoarthritis of  both feet 11/23/2017   DDD (degenerative disc disease), lumbar 11/23/2017   Vaccine counseling 08/13/2017   Sensitive skin 01/23/2017   Need for influenza vaccination 01/23/2017   History of gastrointestinal stromal tumor (GIST) 04/20/2016   History of TIA (transient ischemic attack) 04/20/2016   Screening for breast cancer 04/20/2016   Estrogen deficiency 04/20/2016   Impaired fasting blood sugar 04/20/2016   Constipation 04/20/2016   History of fall 04/20/2016   Chronic radicular lumbar pain 01/27/2016   Encounter for health maintenance examination in adult 03/22/2015   Paresthesia 03/22/2015   Cognitive decline 03/22/2015   Screening for cervical cancer 03/22/2015   Vitamin D deficiency 03/22/2015   History of uterine leiomyoma 03/22/2015   Gastroesophageal reflux disease without esophagitis 03/02/2014   Chronic nausea 03/02/2014   Rhinitis, allergic 03/02/2014   Essential hypertension 03/02/2014   Hyperlipidemia 03/02/2014   Obesity with serious comorbidity 01/16/2012    PCP: Ellyn Hack, MD  REFERRING PROVIDER: Pollyann Savoy, MD  REFERRING DIAG: M25.361 (ICD-10-CM) - Instability of right knee joint M54.2 (ICD-10-CM) - Neck pain  Rationale for Evaluation and Treatment: Rehabilitation  THERAPY DIAG:  Chronic pain of right knee  Muscle weakness (generalized)  Cervicalgia  ONSET DATE: chronic (date of referral: 12/20/22)   SUBJECTIVE:                                                                                                                                                                                           SUBJECTIVE STATEMENT: She states about 5/10 general pain today in legs, arms, back  PERTINENT HISTORY:  Anemia, anxiety, arthritis, cardiomyopathy, HLD, HTN, spinal stenosis, lumbar laminectomy  PAIN:  Are you having pain? Yes: NPRS scale: 5/10 Pain location: Rt neck down arm and side; to Rt knee Pain description: sharp, shooting, dull,  aching Aggravating factors: sitting, walking, lifting groceries Relieving factors: forward bending, "I rub down" and medications   PRECAUTIONS:  None  RED FLAGS: None   WEIGHT BEARING RESTRICTIONS:  No  FALLS:  Has patient fallen in last 6  months? No  LIVING ENVIRONMENT: Lives with: lives with their family (husband and adult son) Lives in: House/apartment Stairs: No Has following equipment at home: Single point cane and has a rollator ordered  OCCUPATION:  On disability  PLOF:  Independent and Leisure: watch TV/phone, does some stretching  PATIENT GOALS:  "I want to be back to 100%"  OBJECTIVE:   DIAGNOSTIC FINDINGS:  Advanced OA in Rt knee  PATIENT SURVEYS:  01/02/23 FOTO 31 (predicted 35)  COGNITIVE STATUS: Within functional limits for tasks assessed   SENSATION: WFL  POSTURE:  rounded shoulders, forward head, and increased lumbar lordosis  HAND DOMINANCE:  Right  GAIT: 01/02/23 Distance walked: 100 Assistive device utilized: Single point cane Level of assistance: Modified independence Comments: antalgic gait; decreased stance on Rt (using cane in Rt hand)   PALPATION: 01/02/23: diffuse tenderness along Rt upper traps and shoulder  CERVICAL ROM:   Active ROM A/PROM (deg) eval  Flexion 48  Extension 38  Right lateral flexion 15  Left lateral flexion 30 (with Rt side pulling)  Right rotation   Left rotation    (Blank rows = not tested)   UPPER EXTREMITY ROM: 01/02/23: bil UE ROM WNL just pain with all motions   UPPER EXTREMITY MMT:  MMT Right eval Left eval  Shoulder flexion 3/5 3+/5  Shoulder extension    Shoulder abduction 3/5 3+/5  Shoulder adduction    Shoulder extension    Shoulder internal rotation 4/5 5/5  Shoulder external rotation 4/5 4/5  Middle trapezius    Lower trapezius    Elbow flexion    Elbow extension    Wrist flexion    Wrist extension    Wrist ulnar deviation    Wrist radial deviation    Wrist pronation     Wrist supination    Grip strength     (Blank rows = not tested)   LOWER EXTREMITY ROM:     Active  Right eval Left eval  Hip flexion    Hip extension    Hip abduction    Hip adduction    Hip internal rotation    Hip external rotation    Knee flexion    Knee extension -1 0  Ankle dorsiflexion    Ankle plantarflexion    Ankle inversion    Ankle eversion     (Blank rows = not tested)   LOWER EXTREMITY MMT:    MMT Right eval Left eval  Hip flexion 4/5 5/5  Hip extension    Hip abduction 3/5 4/5  Hip adduction 4/5 5/5  Hip internal rotation    Hip external rotation    Knee flexion 4/5 5/5  Knee extension 4/5 5/5  Ankle dorsiflexion    Ankle plantarflexion    Ankle inversion    Ankle eversion     (Blank rows = not tested)    SPECIAL TESTS:  01/02/23 Cervical Spurling's test: Negative and Distraction test: Negative  FUNCTIONAL TESTS:  01/02/23 5 times sit to stand: 49.97 sec without UE support   TREATMENT:  DATE:  01/05/23 Pt seen for aquatic therapy today.  Treatment took place in water 3.5-4.75 ft in depth at the [facility] pool. Temp of water was 91.  Pt entered/exited the pool via stairs with hand rail.   Pt requires the buoyancy and hydrostatic pressure of water for support, and to offload joints by unweighting joint load by at least 50 % in navel deep water and by at least 75-80% in chest to neck deep water.  Viscosity of the water is needed for resistance of strengthening. Water current perturbations provides challenge to standing balance requiring increased core activation. Aquatic PT exercises Sidestepping length of pool 3 round trips holding pool noodle for support Forward walking  3 round trips holding pool noodle for support Leg swings hip abd/add X 15 bilat Leg swings hip flexion/extension X 15 bilat Standing  hamstring curls X 15 bilat Hip circles X 15 CW and CCW each, bilat Marches X 15 bilat 1/4 squat with back against wall, shoulder extension X 15 with kickboard 1/4 lunge stance with back against wall, push/pull kickboard X15 bilat Squat with shoulder adduction using water dumbells X 15 Step ups at pool stairs on first step X 10 bilat with UE support    01/02/23 See HEP - reviewed with trial reps performed PRN for comprehension     PATIENT EDUCATION:  Education details: HEP, aquatics Person educated: Patient Education method: Programmer, multimedia, Facilities manager, and Handouts Education comprehension: verbalized understanding, returned demonstration, and needs further education  HOME EXERCISE PROGRAM: Access Code: [email or web address] URL: [email or web address] Date: 01/02/2023 Prepared by: Moshe Cipro  Exercises - Seated Neck AROM Half Circles  - 1 x daily - 7 x weekly - 1 sets - 10 reps - Seated Cervical Rotation AROM  - 1 x daily - 7 x weekly - 1 sets - 10 reps - 5 sec hold - Supine Chin Tuck  - 1 x daily - 7 x weekly - 1 sets - 10 reps - 5 sec hold - Seated Knee Extension AROM  - 1 x daily - 7 x weekly - 1 sets - 10 reps - Seated Straight Leg Raise  - 1 x daily - 7 x weekly - 1 sets - 10 reps - Sit to Stand  - 1 x daily - 7 x weekly - 1 sets - 10 reps - Sidelying Thoracic Rotation with Open Book  - 1 x daily - 7 x weekly - 1 sets - 10 reps -  sec hold  Patient Education - [facility]   ASSESSMENT:  CLINICAL IMPRESSION: She showed good overall tolerance to her first aquatic PT session. We will monitor for any soreness and adjust intensity as appropriate.     OBJECTIVE IMPAIRMENTS: Abnormal gait, decreased activity tolerance, decreased balance, decreased endurance, decreased mobility, difficulty walking, decreased ROM, decreased strength, impaired UE functional use, postural dysfunction, and pain.   ACTIVITY LIMITATIONS: carrying, lifting, bending, sitting,  standing, squatting, sleeping, transfers, bed mobility, bathing, toileting, dressing, hygiene/grooming, and locomotion level  PARTICIPATION LIMITATIONS: meal prep, cleaning, laundry, driving, shopping, and community activity  PERSONAL FACTORS: Age, Past/current experiences, Time since onset of injury/illness/exacerbation, and 3+ comorbidities: Anemia, anxiety, arthritis, cardiomyopathy, HLD, HTN, spinal stenosis, lumbar laminectomy  are also affecting patient's functional outcome.   REHAB POTENTIAL: Fair multiple co morbidities, several episodes of prior PT  CLINICAL DECISION MAKING: Evolving/moderate complexity  EVALUATION COMPLEXITY: Moderate   GOALS: Goals reviewed with patient? Yes  SHORT TERM GOALS: Target date: 01/30/2023  Independent with initial HEP Goal status: INITIAL  2.  Improve 5x STS to < 42 sec for improved function and mobility Goal status: INITIAL  3.  Rt cervical sidebending improved to at least 20 deg for improved function Goal status: INITIAL   LONG TERM GOALS: Target date: 02/27/2023    Independent with final HEP Goal status: INITIAL  2.  FOTO score improved to 35 Goal status: INITIAL  3.  5x sit to stand improved to < 35 sec for improved functional strength and mobility Goal status: INIITAL  4.  Report pain < 4/10 with standing and walking for improved function Goal status: INITIAL  5.  Rt cervical side bending improved to at least 25 deg for improved mobility Goal status: INITIAL   PLAN:  PT FREQUENCY: 1-2x/week  PT DURATION: 8 weeks  PLANNED INTERVENTIONS: Therapeutic exercises, Therapeutic activity, Neuromuscular re-education, Balance training, Gait training, Patient/Family education, Self Care, Joint mobilization, DME instructions, Aquatic Therapy, Dry Needling, Electrical stimulation, Spinal manipulation, Spinal mobilization, Cryotherapy, Moist heat, Taping, Traction, Manual therapy, and Re-evaluation.  PLAN FOR NEXT SESSION: land and  aquatics for strengthening as tolerated, manual/modalities PRN   NEXT MD VISIT: PRN   Ivery Quale, PT, DPT 01/05/23 10:30 AM

## 2023-01-09 ENCOUNTER — Ambulatory Visit (INDEPENDENT_AMBULATORY_CARE_PROVIDER_SITE_OTHER): Payer: Medicare HMO | Admitting: Physical Therapy

## 2023-01-09 ENCOUNTER — Encounter: Payer: Self-pay | Admitting: Physical Therapy

## 2023-01-09 DIAGNOSIS — G8929 Other chronic pain: Secondary | ICD-10-CM | POA: Diagnosis not present

## 2023-01-09 DIAGNOSIS — M6281 Muscle weakness (generalized): Secondary | ICD-10-CM | POA: Diagnosis not present

## 2023-01-09 DIAGNOSIS — M25561 Pain in right knee: Secondary | ICD-10-CM

## 2023-01-09 DIAGNOSIS — M542 Cervicalgia: Secondary | ICD-10-CM | POA: Diagnosis not present

## 2023-01-09 NOTE — Therapy (Signed)
OUTPATIENT PHYSICAL THERAPY TREATMENT   Patient Name: Lisa Briggs MRN: [id number] DOB:1960/08/23, 62 y.o., female Today's Date: 01/09/2023  END OF SESSION:  PT End of Session - 01/09/23 1135     Visit Number 3    Number of Visits 16    Date for PT Re-Evaluation 02/27/23    Authorization Type [employer] $20 copay    Progress Note Due on Visit 10    PT Start Time 1135    PT Stop Time 1216    PT Time Calculation (min) 41 min    Activity Tolerance Patient tolerated treatment well    Behavior During Therapy [facility] for tasks assessed/performed               Past Medical History:  Diagnosis Date   Allergy    Anemia    iron therapy for years as of 10/12; normal Hgb 08/2013   Anxiety    Arthritis    Cardiomyopathy (HCC)    Chest pain 04/05/2011   cardiac eval, normal treadmill stress test, Dr. 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 Axel Filler, [facility]   GERD (gastroesophageal reflux disease)    History of uterine fibroid    Hyperlipidemia    Hypertension    Paresthesia 09/2014   initially thought to be TIA, neurology consult in 12/2014 with other non TIA considerations.     Polyarthralgia    normal rheumatoid screen 01/2012   TIA (transient ischemic attack) 2015/16   Past Surgical History:  Procedure Laterality Date   COLONOSCOPY  01/2014   diverticulosis, othwerise normal - Dr. Rob Bunting   DILATATION & CURETTAGE/HYSTEROSCOPY WITH MYOSURE N/A 07/14/2021   Procedure: DILATATION & CURETTAGE/HYSTEROSCOPY WITH MYOSURE Reach;  Surgeon: Shea Evans, MD;  Location: [facility];  Service: Gynecology;  Laterality: N/A;   ESOPHAGOGASTRODUODENOSCOPY  2013   Dr. Elnoria Howard, gastritis   ESOPHAGOGASTRODUODENOSCOPY  [id number]   EUS N/A 04/16/2014   Procedure: UPPER ENDOSCOPIC ULTRASOUND (EUS) LINEAR;  Surgeon: Rachael Fee, MD;  Location: [facility];  Service: Endoscopy;  Laterality: N/A;   gall stone surgery     KNEE ARTHROSCOPY Left    LAPAROSCOPIC GASTRIC RESECTION N/A 06/09/2014   Procedure: LAPAROSCOPIC GASTRIC MASS RESECTION;  Surgeon: Axel Filler, MD;  Location: [facility];  Service: General;  Laterality: N/A;   LIPOMA EXCISION     forehead   LUMBAR LAMINECTOMY N/A 04/25/2019   Procedure: LEFT L2-3 MICRODISCECTOMY, BILATERAL L5-S1 PARTIAL HEMILAMINECTOMY;  Surgeon: Kerrin Champagne, MD;  Location: [facility];  Service: Orthopedics;  Laterality: N/A;   UPPER GASTROINTESTINAL ENDOSCOPY     UTERINE FIBROID SURGERY     Patient Active Problem List   Diagnosis Date Noted   Unilateral primary osteoarthritis, right knee 05/04/2022   Bilateral ankle pain 09/15/2021   Chronic HFrEF (heart failure with reduced ejection fraction) (HCC) 07/29/2021   Advance directive discussed with patient 01/27/2021   Medicare annual wellness visit, subsequent 01/27/2021   Needs flu shot 01/27/2021   At moderate risk for fall 01/27/2021   Prediabetes 02/19/2020   Menopause 11/25/2019   Cardiomyopathy (HCC) 11/19/2019   Rheumatoid arthritis involving multiple sites with positive rheumatoid factor (HCC) 11/19/2019   Primary osteoarthritis involving multiple joints 11/19/2019   Greater trochanteric bursitis, right 11/19/2019   Decreased activities of daily living (ADL) 11/19/2019  Gait disturbance 11/19/2019   Use of cane as ambulatory aid 11/19/2019   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    Class: Chronic   Spinal stenosis of lumbar region 04/25/2019    Class: Chronic   Status post lumbar laminectomy 04/25/2019   Chronic pain of right knee 09/13/2018   Chronic pain of left knee 07/08/2018   Chronic hip pain 07/08/2018   Edema 07/08/2018   Need for shingles vaccine 12/05/2017   Primary osteoarthritis of both hands 11/23/2017   Primary osteoarthritis of both knees 11/23/2017   Primary osteoarthritis of  both feet 11/23/2017   DDD (degenerative disc disease), lumbar 11/23/2017   Vaccine counseling 08/13/2017   Sensitive skin 01/23/2017   Need for influenza vaccination 01/23/2017   History of gastrointestinal stromal tumor (GIST) 04/20/2016   History of TIA (transient ischemic attack) 04/20/2016   Screening for breast cancer 04/20/2016   Estrogen deficiency 04/20/2016   Impaired fasting blood sugar 04/20/2016   Constipation 04/20/2016   History of fall 04/20/2016   Chronic radicular lumbar pain 01/27/2016   Encounter for health maintenance examination in adult 03/22/2015   Paresthesia 03/22/2015   Cognitive decline 03/22/2015   Screening for cervical cancer 03/22/2015   Vitamin D deficiency 03/22/2015   History of uterine leiomyoma 03/22/2015   Gastroesophageal reflux disease without esophagitis 03/02/2014   Chronic nausea 03/02/2014   Rhinitis, allergic 03/02/2014   Essential hypertension 03/02/2014   Hyperlipidemia 03/02/2014   Obesity with serious comorbidity 01/16/2012    PCP: Ellyn Hack, MD  REFERRING PROVIDER: Pollyann Savoy, MD  REFERRING DIAG: M25.361 (ICD-10-CM) - Instability of right knee joint M54.2 (ICD-10-CM) - Neck pain  Rationale for Evaluation and Treatment: Rehabilitation  THERAPY DIAG:  Chronic pain of right knee  Muscle weakness (generalized)  Cervicalgia  ONSET DATE: chronic (date of referral: 12/20/22)   SUBJECTIVE:                                                                                                                                                                                           SUBJECTIVE STATEMENT: "I ain't hurting today." Tries to do exercises every day.    PERTINENT HISTORY:  Anemia, anxiety, arthritis, cardiomyopathy, HLD, HTN, spinal stenosis, lumbar laminectomy  PAIN:  Are you having pain? Yes: NPRS scale: 5/10 Pain location: Rt neck down arm and side; to Rt knee Pain description: sharp, shooting, dull,  aching Aggravating factors: sitting, walking, lifting groceries Relieving factors: forward bending, "I rub down" and medications   PRECAUTIONS:  None  RED FLAGS: None   WEIGHT BEARING RESTRICTIONS:  No  FALLS:  Has patient fallen in  last 6 months? No  LIVING ENVIRONMENT: Lives with: lives with their family (husband and adult son) Lives in: House/apartment Stairs: No Has following equipment at home: Single point cane and has a rollator ordered  OCCUPATION:  On disability  PLOF:  Independent and Leisure: watch TV/phone, does some stretching  PATIENT GOALS:  "I want to be back to 100%"  OBJECTIVE:   DIAGNOSTIC FINDINGS:  Advanced OA in Rt knee  PATIENT SURVEYS:  01/02/23 FOTO 31 (predicted 35)  COGNITIVE STATUS: Within functional limits for tasks assessed   SENSATION: WFL  POSTURE:  rounded shoulders, forward head, and increased lumbar lordosis  HAND DOMINANCE:  Right  GAIT: 01/02/23 Distance walked: 100 Assistive device utilized: Single point cane Level of assistance: Modified independence Comments: antalgic gait; decreased stance on Rt (using cane in Rt hand)   PALPATION: 01/02/23: diffuse tenderness along Rt upper traps and shoulder  CERVICAL ROM:   Active ROM A/PROM (deg) eval  Flexion 48  Extension 38  Right lateral flexion 15  Left lateral flexion 30 (with Rt side pulling)  Right rotation   Left rotation    (Blank rows = not tested)   UPPER EXTREMITY ROM: 01/02/23: bil UE ROM WNL just pain with all motions   UPPER EXTREMITY MMT:  MMT Right eval Left eval  Shoulder flexion 3/5 3+/5  Shoulder extension    Shoulder abduction 3/5 3+/5  Shoulder adduction    Shoulder extension    Shoulder internal rotation 4/5 5/5  Shoulder external rotation 4/5 4/5   (Blank rows = not tested)   LOWER EXTREMITY ROM:     Active  Right eval Left eval  Hip flexion    Hip extension    Hip abduction    Hip adduction    Hip internal rotation     Hip external rotation    Knee flexion    Knee extension -1 0  Ankle dorsiflexion    Ankle plantarflexion    Ankle inversion    Ankle eversion     (Blank rows = not tested)   LOWER EXTREMITY MMT:    MMT Right eval Left eval  Hip flexion 4/5 5/5  Hip extension    Hip abduction 3/5 4/5  Hip adduction 4/5 5/5  Hip internal rotation    Hip external rotation    Knee flexion 4/5 5/5  Knee extension 4/5 5/5  Ankle dorsiflexion    Ankle plantarflexion    Ankle inversion    Ankle eversion     (Blank rows = not tested)    SPECIAL TESTS:  01/02/23 Cervical Spurling's test: Negative and Distraction test: Negative  FUNCTIONAL TESTS:  01/02/23 5 times sit to stand: 49.97 sec without UE support   TREATMENT:                                                                                                                              DATE:  01/09/23 TherEx NuStep UE/LE L6  x 8 min Neck half circles x 10 reps each direction Seated cervical rotation x10 reps bil; 5 sec hold Seated cervical retraction x 10 reps; 5 sec hold Seated SLR x10 reps bil Sit to/from stand x 10 reps - elevated surface; no UEs  01/05/23 Pt seen for aquatic therapy today.  Treatment took place in water 3.5-4.75 ft in depth at the [facility] pool. Temp of water was 91.  Pt entered/exited the pool via stairs with hand rail.   Pt requires the buoyancy and hydrostatic pressure of water for support, and to offload joints by unweighting joint load by at least 50 % in navel deep water and by at least 75-80% in chest to neck deep water.  Viscosity of the water is needed for resistance of strengthening. Water current perturbations provides challenge to standing balance requiring increased core activation. Aquatic PT exercises Sidestepping length of pool 3 round trips holding pool noodle for support Forward walking  3 round trips holding pool noodle for support Leg swings hip abd/add X 15 bilat Leg swings hip  flexion/extension X 15 bilat Standing hamstring curls X 15 bilat Hip circles X 15 CW and CCW each, bilat Marches X 15 bilat 1/4 squat with back against wall, shoulder extension X 15 with kickboard 1/4 lunge stance with back against wall, push/pull kickboard X15 bilat Squat with shoulder adduction using water dumbells X 15 Step ups at pool stairs on first step X 10 bilat with UE support    01/02/23 See HEP - reviewed with trial reps performed PRN for comprehension     PATIENT EDUCATION:  Education details: HEP, aquatics Person educated: Patient Education method: Programmer, multimedia, Facilities manager, and Handouts Education comprehension: verbalized understanding, returned demonstration, and needs further education  HOME EXERCISE PROGRAM: Access Code: [email or web address] URL: [email or web address] Date: 01/02/2023 Prepared by: Moshe Cipro  Exercises - Seated Neck AROM Half Circles  - 1 x daily - 7 x weekly - 1 sets - 10 reps - Seated Cervical Rotation AROM  - 1 x daily - 7 x weekly - 1 sets - 10 reps - 5 sec hold - Supine Chin Tuck  - 1 x daily - 7 x weekly - 1 sets - 10 reps - 5 sec hold - Seated Knee Extension AROM  - 1 x daily - 7 x weekly - 1 sets - 10 reps - Seated Straight Leg Raise  - 1 x daily - 7 x weekly - 1 sets - 10 reps - Sit to Stand  - 1 x daily - 7 x weekly - 1 sets - 10 reps - Sidelying Thoracic Rotation with Open Book  - 1 x daily - 7 x weekly - 1 sets - 10 reps -  sec hold  Patient Education - [facility]   ASSESSMENT:  CLINICAL IMPRESSION: Session today focused on review of HEP with min cues needed for technique.  Pain continues to be intermittent at this time.  All goals ongoing, will continue to benefit from PT to maximize function.   OBJECTIVE IMPAIRMENTS: Abnormal gait, decreased activity tolerance, decreased balance, decreased endurance, decreased mobility, difficulty walking, decreased ROM, decreased strength, impaired UE functional use,  postural dysfunction, and pain.   ACTIVITY LIMITATIONS: carrying, lifting, bending, sitting, standing, squatting, sleeping, transfers, bed mobility, bathing, toileting, dressing, hygiene/grooming, and locomotion level  PARTICIPATION LIMITATIONS: meal prep, cleaning, laundry, driving, shopping, and community activity  PERSONAL FACTORS: Age, Past/current experiences, Time since onset of injury/illness/exacerbation, and 3+ comorbidities: Anemia, anxiety, arthritis, cardiomyopathy, HLD, HTN, spinal stenosis,  lumbar laminectomy  are also affecting patient's functional outcome.   REHAB POTENTIAL: Fair multiple co morbidities, several episodes of prior PT  CLINICAL DECISION MAKING: Evolving/moderate complexity  EVALUATION COMPLEXITY: Moderate   GOALS: Goals reviewed with patient? Yes  SHORT TERM GOALS: Target date: 01/30/2023  Independent with initial HEP Goal status: INITIAL  2.  Improve 5x STS to < 42 sec for improved function and mobility Goal status: INITIAL  3.  Rt cervical sidebending improved to at least 20 deg for improved function Goal status: INITIAL   LONG TERM GOALS: Target date: 02/27/2023    Independent with final HEP Goal status: INITIAL  2.  FOTO score improved to 35 Goal status: INITIAL  3.  5x sit to stand improved to < 35 sec for improved functional strength and mobility Goal status: INIITAL  4.  Report pain < 4/10 with standing and walking for improved function Goal status: INITIAL  5.  Rt cervical side bending improved to at least 25 deg for improved mobility Goal status: INITIAL   PLAN:  PT FREQUENCY: 1-2x/week  PT DURATION: 8 weeks  PLANNED INTERVENTIONS: Therapeutic exercises, Therapeutic activity, Neuromuscular re-education, Balance training, Gait training, Patient/Family education, Self Care, Joint mobilization, DME instructions, Aquatic Therapy, Dry Needling, Electrical stimulation, Spinal manipulation, Spinal mobilization, Cryotherapy,  Moist heat, Taping, Traction, Manual therapy, and Re-evaluation.  PLAN FOR NEXT SESSION: review HEP PRN, land and aquatics for strengthening as tolerated, manual/modalities PRN   NEXT MD VISIT: PRN    Clarita Crane, PT, DPT 01/09/23 12:23 PM

## 2023-01-12 ENCOUNTER — Encounter: Payer: Self-pay | Admitting: Physical Therapy

## 2023-01-12 ENCOUNTER — Ambulatory Visit: Payer: Medicare HMO | Admitting: Physical Therapy

## 2023-01-12 DIAGNOSIS — M6281 Muscle weakness (generalized): Secondary | ICD-10-CM | POA: Diagnosis not present

## 2023-01-12 DIAGNOSIS — M542 Cervicalgia: Secondary | ICD-10-CM

## 2023-01-12 DIAGNOSIS — G8929 Other chronic pain: Secondary | ICD-10-CM

## 2023-01-12 DIAGNOSIS — M25561 Pain in right knee: Secondary | ICD-10-CM

## 2023-01-12 NOTE — Therapy (Signed)
OUTPATIENT PHYSICAL THERAPY TREATMENT   Patient Name: Lisa Briggs MRN: [id number] DOB:1961/01/29, 63 y.o., female Today's Date: 01/12/2023  END OF SESSION:  PT End of Session - 01/12/23 0938     Visit Number 4    Number of Visits 16    Date for PT Re-Evaluation 02/27/23    Authorization Type [employer] $20 copay    Progress Note Due on Visit 10    PT Start Time 0930    PT Stop Time 1010    PT Time Calculation (min) 40 min    Activity Tolerance Patient tolerated treatment well    Behavior During Therapy [facility] for tasks assessed/performed               Past Medical History:  Diagnosis Date   Allergy    Anemia    iron therapy for years as of 10/12; normal Hgb 08/2013   Anxiety    Arthritis    Cardiomyopathy (HCC)    Chest pain 04/05/2011   cardiac eval, normal treadmill stress test, Dr. 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 Axel Filler, [facility]   GERD (gastroesophageal reflux disease)    History of uterine fibroid    Hyperlipidemia    Hypertension    Paresthesia 09/2014   initially thought to be TIA, neurology consult in 12/2014 with other non TIA considerations.     Polyarthralgia    normal rheumatoid screen 01/2012   TIA (transient ischemic attack) 2015/16   Past Surgical History:  Procedure Laterality Date   COLONOSCOPY  01/2014   diverticulosis, othwerise normal - Dr. Rob Bunting   DILATATION & CURETTAGE/HYSTEROSCOPY WITH MYOSURE N/A 07/14/2021   Procedure: DILATATION & CURETTAGE/HYSTEROSCOPY WITH MYOSURE Reach;  Surgeon: Shea Evans, MD;  Location: [facility];  Service: Gynecology;  Laterality: N/A;   ESOPHAGOGASTRODUODENOSCOPY  2013   Dr. Elnoria Howard, gastritis   ESOPHAGOGASTRODUODENOSCOPY  [id number]   EUS N/A 04/16/2014   Procedure: UPPER ENDOSCOPIC ULTRASOUND (EUS) LINEAR;  Surgeon: Rachael Fee, MD;  Location: [facility];  Service: Endoscopy;  Laterality: N/A;   gall stone surgery     KNEE ARTHROSCOPY Left    LAPAROSCOPIC GASTRIC RESECTION N/A 06/09/2014   Procedure: LAPAROSCOPIC GASTRIC MASS RESECTION;  Surgeon: Axel Filler, MD;  Location: [facility];  Service: General;  Laterality: N/A;   LIPOMA EXCISION     forehead   LUMBAR LAMINECTOMY N/A 04/25/2019   Procedure: LEFT L2-3 MICRODISCECTOMY, BILATERAL L5-S1 PARTIAL HEMILAMINECTOMY;  Surgeon: Kerrin Champagne, MD;  Location: [facility];  Service: Orthopedics;  Laterality: N/A;   UPPER GASTROINTESTINAL ENDOSCOPY     UTERINE FIBROID SURGERY     Patient Active Problem List   Diagnosis Date Noted   Unilateral primary osteoarthritis, right knee 05/04/2022   Bilateral ankle pain 09/15/2021   Chronic HFrEF (heart failure with reduced ejection fraction) (HCC) 07/29/2021   Advance directive discussed with patient 01/27/2021   Medicare annual wellness visit, subsequent 01/27/2021   Needs flu shot 01/27/2021   At moderate risk for fall 01/27/2021   Prediabetes 02/19/2020   Menopause 11/25/2019   Cardiomyopathy (HCC) 11/19/2019   Rheumatoid arthritis involving multiple sites with positive rheumatoid factor (HCC) 11/19/2019   Primary osteoarthritis involving multiple joints 11/19/2019   Greater trochanteric bursitis, right 11/19/2019   Decreased activities of daily living (ADL) 11/19/2019  Gait disturbance 11/19/2019   Use of cane as ambulatory aid 11/19/2019   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    Class: Chronic   Spinal stenosis of lumbar region 04/25/2019    Class: Chronic   Status post lumbar laminectomy 04/25/2019   Chronic pain of right knee 09/13/2018   Chronic pain of left knee 07/08/2018   Chronic hip pain 07/08/2018   Edema 07/08/2018   Need for shingles vaccine 12/05/2017   Primary osteoarthritis of both hands 11/23/2017   Primary osteoarthritis of both knees 11/23/2017   Primary osteoarthritis of  both feet 11/23/2017   DDD (degenerative disc disease), lumbar 11/23/2017   Vaccine counseling 08/13/2017   Sensitive skin 01/23/2017   Need for influenza vaccination 01/23/2017   History of gastrointestinal stromal tumor (GIST) 04/20/2016   History of TIA (transient ischemic attack) 04/20/2016   Screening for breast cancer 04/20/2016   Estrogen deficiency 04/20/2016   Impaired fasting blood sugar 04/20/2016   Constipation 04/20/2016   History of fall 04/20/2016   Chronic radicular lumbar pain 01/27/2016   Encounter for health maintenance examination in adult 03/22/2015   Paresthesia 03/22/2015   Cognitive decline 03/22/2015   Screening for cervical cancer 03/22/2015   Vitamin D deficiency 03/22/2015   History of uterine leiomyoma 03/22/2015   Gastroesophageal reflux disease without esophagitis 03/02/2014   Chronic nausea 03/02/2014   Rhinitis, allergic 03/02/2014   Essential hypertension 03/02/2014   Hyperlipidemia 03/02/2014   Obesity with serious comorbidity 01/16/2012    PCP: Ellyn Hack, MD  REFERRING PROVIDER: Pollyann Savoy, MD  REFERRING DIAG: M25.361 (ICD-10-CM) - Instability of right knee joint M54.2 (ICD-10-CM) - Neck pain  Rationale for Evaluation and Treatment: Rehabilitation  THERAPY DIAG:  Chronic pain of right knee  Muscle weakness (generalized)  Cervicalgia  ONSET DATE: chronic (date of referral: 12/20/22)   SUBJECTIVE:                                                                                                                                                                                           SUBJECTIVE STATEMENT: Relays some general pain and soreness but not bad   PERTINENT HISTORY:  Anemia, anxiety, arthritis, cardiomyopathy, HLD, HTN, spinal stenosis, lumbar laminectomy  PAIN:  Are you having pain? Yes: NPRS scale: 4/10 Pain location: Rt neck down arm and side; to Rt knee Pain description: sharp, shooting, dull,  aching Aggravating factors: sitting, walking, lifting groceries Relieving factors: forward bending, "I rub down" and medications   PRECAUTIONS:  None  RED FLAGS: None   WEIGHT BEARING RESTRICTIONS:  No  FALLS:  Has patient fallen in last 6  months? No  LIVING ENVIRONMENT: Lives with: lives with their family (husband and adult son) Lives in: House/apartment Stairs: No Has following equipment at home: Single point cane and has a rollator ordered  OCCUPATION:  On disability  PLOF:  Independent and Leisure: watch TV/phone, does some stretching  PATIENT GOALS:  "I want to be back to 100%"  OBJECTIVE:   DIAGNOSTIC FINDINGS:  Advanced OA in Rt knee  PATIENT SURVEYS:  01/02/23 FOTO 31 (predicted 35)  COGNITIVE STATUS: Within functional limits for tasks assessed   SENSATION: WFL  POSTURE:  rounded shoulders, forward head, and increased lumbar lordosis  HAND DOMINANCE:  Right  GAIT: 01/02/23 Distance walked: 100 Assistive device utilized: Single point cane Level of assistance: Modified independence Comments: antalgic gait; decreased stance on Rt (using cane in Rt hand)   PALPATION: 01/02/23: diffuse tenderness along Rt upper traps and shoulder  CERVICAL ROM:   Active ROM A/PROM (deg) eval  Flexion 48  Extension 38  Right lateral flexion 15  Left lateral flexion 30 (with Rt side pulling)  Right rotation   Left rotation    (Blank rows = not tested)   UPPER EXTREMITY ROM: 01/02/23: bil UE ROM WNL just pain with all motions   UPPER EXTREMITY MMT:  MMT Right eval Left eval  Shoulder flexion 3/5 3+/5  Shoulder extension    Shoulder abduction 3/5 3+/5  Shoulder adduction    Shoulder extension    Shoulder internal rotation 4/5 5/5  Shoulder external rotation 4/5 4/5   (Blank rows = not tested)   LOWER EXTREMITY ROM:     Active  Right eval Left eval  Hip flexion    Hip extension    Hip abduction    Hip adduction    Hip internal rotation     Hip external rotation    Knee flexion    Knee extension -1 0  Ankle dorsiflexion    Ankle plantarflexion    Ankle inversion    Ankle eversion     (Blank rows = not tested)   LOWER EXTREMITY MMT:    MMT Right eval Left eval  Hip flexion 4/5 5/5  Hip extension    Hip abduction 3/5 4/5  Hip adduction 4/5 5/5  Hip internal rotation    Hip external rotation    Knee flexion 4/5 5/5  Knee extension 4/5 5/5  Ankle dorsiflexion    Ankle plantarflexion    Ankle inversion    Ankle eversion     (Blank rows = not tested)    SPECIAL TESTS:  01/02/23 Cervical Spurling's test: Negative and Distraction test: Negative  FUNCTIONAL TESTS:  01/02/23 5 times sit to stand: 49.97 sec without UE support   TREATMENT:                                                                                                                              DATE:  01/12/23 Pt seen for aquatic therapy today.  Treatment took place in water 3.5-4.75 ft in depth at the [facility] pool. Temp of water was 91.  Pt entered/exited the pool via stairs with hand rail.   Pt requires the buoyancy and hydrostatic pressure of water for support, and to offload joints by unweighting joint load by at least 50 % in navel deep water and by at least 75-80% in chest to neck deep water.  Viscosity of the water is needed for resistance of strengthening. Water current perturbations provides challenge to standing balance requiring increased core activation. Aquatic PT exercises Sidestepping length of pool 3 round trips holding pool noodle for support Forward walking  3 round trips holding pool noodle for support Backward walking  3 round trips holding pool noodle for support Leg swings hip abd/add X 15 bilat Leg swings hip flexion/extension X 15 bilat Standing hamstring curls X 15 bilat Hip circles X 15 CW and CCW each, bilat Marches X 15 bilat 1/4 squat with back against wall, shoulder extension X 15 with kickboard 1/4  lunge stance with back against wall, push/pull kickboard X15 bilat Step ups at pool stairs on first step X 10 bilat with UE support  01/09/23 TherEx NuStep UE/LE L6 x 8 min Neck half circles x 10 reps each direction Seated cervical rotation x10 reps bil; 5 sec hold Seated cervical retraction x 10 reps; 5 sec hold Seated SLR x10 reps bil Sit to/from stand x 10 reps - elevated surface; no UEs  01/05/23 Pt seen for aquatic therapy today.  Treatment took place in water 3.5-4.75 ft in depth at the [facility] pool. Temp of water was 91.  Pt entered/exited the pool via stairs with hand rail.   Pt requires the buoyancy and hydrostatic pressure of water for support, and to offload joints by unweighting joint load by at least 50 % in navel deep water and by at least 75-80% in chest to neck deep water.  Viscosity of the water is needed for resistance of strengthening. Water current perturbations provides challenge to standing balance requiring increased core activation. Aquatic PT exercises Sidestepping length of pool 3 round trips holding pool noodle for support Forward walking  3 round trips holding pool noodle for support Leg swings hip abd/add X 15 bilat Leg swings hip flexion/extension X 15 bilat Standing hamstring curls X 15 bilat Hip circles X 15 CW and CCW each, bilat Marches X 15 bilat 1/4 squat with back against wall, shoulder extension X 15 with kickboard 1/4 lunge stance with back against wall, push/pull kickboard X15 bilat Squat with shoulder adduction using water dumbells X 15 Step ups at pool stairs on first step X 10 bilat with UE support    PATIENT EDUCATION:  Education details: HEP, aquatics Person educated: Patient Education method: Programmer, multimedia, Facilities manager, and Handouts Education comprehension: verbalized understanding, returned demonstration, and needs further education  HOME EXERCISE PROGRAM: Access Code: [email or web address] URL:  [email or web address] Date: 01/02/2023 Prepared by: Moshe Cipro  Exercises - Seated Neck AROM Half Circles  - 1 x daily - 7 x weekly - 1 sets - 10 reps - Seated Cervical Rotation AROM  - 1 x daily - 7 x weekly - 1 sets - 10 reps - 5 sec hold - Supine Chin Tuck  - 1 x daily - 7 x weekly - 1 sets - 10 reps - 5 sec hold - Seated Knee Extension AROM  - 1 x daily - 7 x weekly - 1 sets - 10 reps - Seated Straight Leg Raise  -  1 x daily - 7 x weekly - 1 sets - 10 reps - Sit to Stand  - 1 x daily - 7 x weekly - 1 sets - 10 reps - Sidelying Thoracic Rotation with Open Book  - 1 x daily - 7 x weekly - 1 sets - 10 reps -  sec hold  Patient Education - [facility]   ASSESSMENT:  CLINICAL IMPRESSION: She had second aquatic PT session with good overall tolerance noted to activity working to improve strength, ROM, and balance. PT recommending to continue with current treatment plan.    OBJECTIVE IMPAIRMENTS: Abnormal gait, decreased activity tolerance, decreased balance, decreased endurance, decreased mobility, difficulty walking, decreased ROM, decreased strength, impaired UE functional use, postural dysfunction, and pain.   ACTIVITY LIMITATIONS: carrying, lifting, bending, sitting, standing, squatting, sleeping, transfers, bed mobility, bathing, toileting, dressing, hygiene/grooming, and locomotion level  PARTICIPATION LIMITATIONS: meal prep, cleaning, laundry, driving, shopping, and community activity  PERSONAL FACTORS: Age, Past/current experiences, Time since onset of injury/illness/exacerbation, and 3+ comorbidities: Anemia, anxiety, arthritis, cardiomyopathy, HLD, HTN, spinal stenosis, lumbar laminectomy  are also affecting patient's functional outcome.   REHAB POTENTIAL: Fair multiple co morbidities, several episodes of prior PT  CLINICAL DECISION MAKING: Evolving/moderate complexity  EVALUATION COMPLEXITY: Moderate   GOALS: Goals reviewed with patient?  Yes  SHORT TERM GOALS: Target date: 01/30/2023  Independent with initial HEP Goal status: INITIAL  2.  Improve 5x STS to < 42 sec for improved function and mobility Goal status: INITIAL  3.  Rt cervical sidebending improved to at least 20 deg for improved function Goal status: INITIAL   LONG TERM GOALS: Target date: 02/27/2023    Independent with final HEP Goal status: INITIAL  2.  FOTO score improved to 35 Goal status: INITIAL  3.  5x sit to stand improved to < 35 sec for improved functional strength and mobility Goal status: INIITAL  4.  Report pain < 4/10 with standing and walking for improved function Goal status: INITIAL  5.  Rt cervical side bending improved to at least 25 deg for improved mobility Goal status: INITIAL   PLAN:  PT FREQUENCY: 1-2x/week  PT DURATION: 8 weeks  PLANNED INTERVENTIONS: Therapeutic exercises, Therapeutic activity, Neuromuscular re-education, Balance training, Gait training, Patient/Family education, Self Care, Joint mobilization, DME instructions, Aquatic Therapy, Dry Needling, Electrical stimulation, Spinal manipulation, Spinal mobilization, Cryotherapy, Moist heat, Taping, Traction, Manual therapy, and Re-evaluation.  PLAN FOR NEXT SESSION: review HEP PRN, land and aquatics for strengthening as tolerated, manual/modalities PRN   NEXT MD VISIT: PRN  Ivery Quale, PT, DPT 01/12/23 9:39 AM

## 2023-01-15 ENCOUNTER — Ambulatory Visit (INDEPENDENT_AMBULATORY_CARE_PROVIDER_SITE_OTHER): Payer: Medicare HMO | Admitting: Physical Therapy

## 2023-01-15 ENCOUNTER — Encounter: Payer: Self-pay | Admitting: Physical Therapy

## 2023-01-15 DIAGNOSIS — M25561 Pain in right knee: Secondary | ICD-10-CM

## 2023-01-15 DIAGNOSIS — G8929 Other chronic pain: Secondary | ICD-10-CM

## 2023-01-15 DIAGNOSIS — M542 Cervicalgia: Secondary | ICD-10-CM | POA: Diagnosis not present

## 2023-01-15 DIAGNOSIS — M6281 Muscle weakness (generalized): Secondary | ICD-10-CM

## 2023-01-15 NOTE — Therapy (Signed)
OUTPATIENT PHYSICAL THERAPY TREATMENT   Patient Name: Lisa Briggs MRN: [id number] DOB:[birth year], 62 y.o., female Today's Date: 01/15/2023  END OF SESSION:  PT End of Session - 01/15/23 1138     Visit Number 5    Number of Visits 16    Date for PT Re-Evaluation 02/27/23    Authorization Type [employer] $20 copay    Progress Note Due on Visit 10    PT Start Time 1140    PT Stop Time 1225    PT Time Calculation (min) 45 min    Activity Tolerance Patient tolerated treatment well    Behavior During Therapy [facility] for tasks assessed/performed                Past Medical History:  Diagnosis Date   Allergy    Anemia    iron therapy for years as of 10/12; normal Hgb 08/2013   Anxiety    Arthritis    Cardiomyopathy (HCC)    Chest pain 04/05/2011   cardiac eval, normal treadmill stress test, Dr. 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 Axel Filler, [facility]   GERD (gastroesophageal reflux disease)    History of uterine fibroid    Hyperlipidemia    Hypertension    Paresthesia 09/2014   initially thought to be TIA, neurology consult in 12/2014 with other non TIA considerations.     Polyarthralgia    normal rheumatoid screen 01/2012   TIA (transient ischemic attack) 2015/16   Past Surgical History:  Procedure Laterality Date   COLONOSCOPY  01/2014   diverticulosis, othwerise normal - Dr. Rob Bunting   DILATATION & CURETTAGE/HYSTEROSCOPY WITH MYOSURE N/A 07/14/2021   Procedure: DILATATION & CURETTAGE/HYSTEROSCOPY WITH MYOSURE Reach;  Surgeon: Shea Evans, MD;  Location: [facility];  Service: Gynecology;  Laterality: N/A;   ESOPHAGOGASTRODUODENOSCOPY  2013   Dr. Elnoria Howard, gastritis   ESOPHAGOGASTRODUODENOSCOPY  [id number]   EUS N/A 04/16/2014   Procedure: UPPER ENDOSCOPIC ULTRASOUND (EUS) LINEAR;  Surgeon: Rachael Fee, MD;  Location: [facility];  Service: Endoscopy;  Laterality: N/A;   gall stone surgery     KNEE ARTHROSCOPY Left    LAPAROSCOPIC GASTRIC RESECTION N/A 06/09/2014   Procedure: LAPAROSCOPIC GASTRIC MASS RESECTION;  Surgeon: Axel Filler, MD;  Location: [facility];  Service: General;  Laterality: N/A;   LIPOMA EXCISION     forehead   LUMBAR LAMINECTOMY N/A 04/25/2019   Procedure: LEFT L2-3 MICRODISCECTOMY, BILATERAL L5-S1 PARTIAL HEMILAMINECTOMY;  Surgeon: Kerrin Champagne, MD;  Location: [facility];  Service: Orthopedics;  Laterality: N/A;   UPPER GASTROINTESTINAL ENDOSCOPY     UTERINE FIBROID SURGERY     Patient Active Problem List   Diagnosis Date Noted   Unilateral primary osteoarthritis, right knee 05/04/2022   Bilateral ankle pain 09/15/2021   Chronic HFrEF (heart failure with reduced ejection fraction) (HCC) 07/29/2021   Advance directive discussed with patient 01/27/2021   Medicare annual wellness visit, subsequent 01/27/2021   Needs flu shot 01/27/2021   At moderate risk for fall 01/27/2021   Prediabetes 02/19/2020   Menopause 11/25/2019   Cardiomyopathy (HCC) 11/19/2019   Rheumatoid arthritis involving multiple sites with positive rheumatoid factor (HCC) 11/19/2019   Primary osteoarthritis involving multiple joints 11/19/2019   Greater trochanteric bursitis, right 11/19/2019   Decreased activities of daily living (ADL) 11/19/2019  Gait disturbance 11/19/2019   Use of cane as ambulatory aid 11/19/2019   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    Class: Chronic   Spinal stenosis of lumbar region 04/25/2019    Class: Chronic   Status post lumbar laminectomy 04/25/2019   Chronic pain of right knee 09/13/2018   Chronic pain of left knee 07/08/2018   Chronic hip pain 07/08/2018   Edema 07/08/2018   Need for shingles vaccine 12/05/2017   Primary osteoarthritis of both hands 11/23/2017   Primary osteoarthritis of both knees 11/23/2017   Primary osteoarthritis  of both feet 11/23/2017   DDD (degenerative disc disease), lumbar 11/23/2017   Vaccine counseling 08/13/2017   Sensitive skin 01/23/2017   Need for influenza vaccination 01/23/2017   History of gastrointestinal stromal tumor (GIST) 04/20/2016   History of TIA (transient ischemic attack) 04/20/2016   Screening for breast cancer 04/20/2016   Estrogen deficiency 04/20/2016   Impaired fasting blood sugar 04/20/2016   Constipation 04/20/2016   History of fall 04/20/2016   Chronic radicular lumbar pain 01/27/2016   Encounter for health maintenance examination in adult 03/22/2015   Paresthesia 03/22/2015   Cognitive decline 03/22/2015   Screening for cervical cancer 03/22/2015   Vitamin D deficiency 03/22/2015   History of uterine leiomyoma 03/22/2015   Gastroesophageal reflux disease without esophagitis 03/02/2014   Chronic nausea 03/02/2014   Rhinitis, allergic 03/02/2014   Essential hypertension 03/02/2014   Hyperlipidemia 03/02/2014   Obesity with serious comorbidity 01/16/2012    PCP: Ellyn Hack, MD  REFERRING PROVIDER: Pollyann Savoy, MD  REFERRING DIAG: M25.361 (ICD-10-CM) - Instability of right knee joint M54.2 (ICD-10-CM) - Neck pain  Rationale for Evaluation and Treatment: Rehabilitation  THERAPY DIAG:  Chronic pain of right knee  Muscle weakness (generalized)  Cervicalgia  ONSET DATE: chronic (date of referral: 12/20/22)   SUBJECTIVE:                                                                                                                                                                                           SUBJECTIVE STATEMENT: Feeling better; sore today.    PERTINENT HISTORY:  Anemia, anxiety, arthritis, cardiomyopathy, HLD, HTN, spinal stenosis, lumbar laminectomy  PAIN:  Are you having pain? Yes: NPRS scale: 4/10 Pain location: Rt neck down arm and side; to Rt knee Pain description: sharp, shooting, dull, aching Aggravating factors:  sitting, walking, lifting groceries Relieving factors: forward bending, "I rub down" and medications   PRECAUTIONS:  None  RED FLAGS: None   WEIGHT BEARING RESTRICTIONS:  No  FALLS:  Has patient fallen in last 6 months? No  LIVING  ENVIRONMENT: Lives with: lives with their family (husband and adult son) Lives in: House/apartment Stairs: No Has following equipment at home: Single point cane and has a rollator ordered  OCCUPATION:  On disability  PLOF:  Independent and Leisure: watch TV/phone, does some stretching  PATIENT GOALS:  "I want to be back to 100%"  OBJECTIVE:   DIAGNOSTIC FINDINGS:  Advanced OA in Rt knee  PATIENT SURVEYS:  01/02/23 FOTO 31 (predicted 35)  COGNITIVE STATUS: Within functional limits for tasks assessed   SENSATION: WFL  POSTURE:  rounded shoulders, forward head, and increased lumbar lordosis  HAND DOMINANCE:  Right  GAIT: 01/02/23 Distance walked: 100 Assistive device utilized: Single point cane Level of assistance: Modified independence Comments: antalgic gait; decreased stance on Rt (using cane in Rt hand)   PALPATION: 01/02/23: diffuse tenderness along Rt upper traps and shoulder  CERVICAL ROM:   Active ROM A/PROM (deg) eval AROM 01/15/23  Flexion 48   Extension 38   Right lateral flexion 15 28  Left lateral flexion 30 (with Rt side pulling)   Right rotation    Left rotation     (Blank rows = not tested)   UPPER EXTREMITY ROM: 01/02/23: bil UE ROM WNL just pain with all motions   UPPER EXTREMITY MMT:  MMT Right eval Left eval  Shoulder flexion 3/5 3+/5  Shoulder extension    Shoulder abduction 3/5 3+/5  Shoulder adduction    Shoulder extension    Shoulder internal rotation 4/5 5/5  Shoulder external rotation 4/5 4/5   (Blank rows = not tested)   LOWER EXTREMITY ROM:     Active  Right eval Left eval  Knee extension -1 0   (Blank rows = not tested)   LOWER EXTREMITY MMT:    MMT Right eval  Left eval  Hip flexion 4/5 5/5  Hip extension    Hip abduction 3/5 4/5  Hip adduction 4/5 5/5  Hip internal rotation    Hip external rotation    Knee flexion 4/5 5/5  Knee extension 4/5 5/5  Ankle dorsiflexion    Ankle plantarflexion    Ankle inversion    Ankle eversion     (Blank rows = not tested)    SPECIAL TESTS:  01/02/23 Cervical Spurling's test: Negative and Distraction test: Negative  FUNCTIONAL TESTS:  01/02/23 5 times sit to stand: 49.97 sec without UE support 01/15/23: unable; several attempts needed to stand    TREATMENT:                                                                                                                              DATE:  01/15/23 TherEx SciFit bike UE/LE L5 x 8 min BATCA rows 10# 2x10 Seated horizontal abduction 2x10; L2 band Seated ER with L2 band 2x10 Seated towel cervical rotation stretch 5x10 sec hold bil AROM measurements - see above Attempted 5x STS - unable to complete more than 1 stand Sit to/from  stand with UE support x 5 reps  Modalities Moist heat to Rt shoulder x 5 min   01/12/23 Pt seen for aquatic therapy today.  Treatment took place in water 3.5-4.75 ft in depth at the [facility] pool. Temp of water was 91.  Pt entered/exited the pool via stairs with hand rail.   Pt requires the buoyancy and hydrostatic pressure of water for support, and to offload joints by unweighting joint load by at least 50 % in navel deep water and by at least 75-80% in chest to neck deep water.  Viscosity of the water is needed for resistance of strengthening. Water current perturbations provides challenge to standing balance requiring increased core activation. Aquatic PT exercises Sidestepping length of pool 3 round trips holding pool noodle for support Forward walking  3 round trips holding pool noodle for support Backward walking  3 round trips holding pool noodle for support Leg swings hip abd/add X 15 bilat Leg swings  hip flexion/extension X 15 bilat Standing hamstring curls X 15 bilat Hip circles X 15 CW and CCW each, bilat Marches X 15 bilat 1/4 squat with back against wall, shoulder extension X 15 with kickboard 1/4 lunge stance with back against wall, push/pull kickboard X15 bilat Step ups at pool stairs on first step X 10 bilat with UE support  01/09/23 TherEx NuStep UE/LE L6 x 8 min Neck half circles x 10 reps each direction Seated cervical rotation x10 reps bil; 5 sec hold Seated cervical retraction x 10 reps; 5 sec hold Seated SLR x10 reps bil Sit to/from stand x 10 reps - elevated surface; no UEs  01/05/23 Pt seen for aquatic therapy today.  Treatment took place in water 3.5-4.75 ft in depth at the [facility] pool. Temp of water was 91.  Pt entered/exited the pool via stairs with hand rail.   Pt requires the buoyancy and hydrostatic pressure of water for support, and to offload joints by unweighting joint load by at least 50 % in navel deep water and by at least 75-80% in chest to neck deep water.  Viscosity of the water is needed for resistance of strengthening. Water current perturbations provides challenge to standing balance requiring increased core activation. Aquatic PT exercises Sidestepping length of pool 3 round trips holding pool noodle for support Forward walking  3 round trips holding pool noodle for support Leg swings hip abd/add X 15 bilat Leg swings hip flexion/extension X 15 bilat Standing hamstring curls X 15 bilat Hip circles X 15 CW and CCW each, bilat Marches X 15 bilat 1/4 squat with back against wall, shoulder extension X 15 with kickboard 1/4 lunge stance with back against wall, push/pull kickboard X15 bilat Squat with shoulder adduction using water dumbells X 15 Step ups at pool stairs on first step X 10 bilat with UE support    PATIENT EDUCATION:  Education details: HEP, aquatics Person educated: Patient Education method: Programmer, multimedia, Facilities manager,  and Handouts Education comprehension: verbalized understanding, returned demonstration, and needs further education  HOME EXERCISE PROGRAM: Access Code: [email or web address] URL: [email or web address] Date: 01/02/2023 Prepared by: Moshe Cipro  Exercises - Seated Neck AROM Half Circles  - 1 x daily - 7 x weekly - 1 sets - 10 reps - Seated Cervical Rotation AROM  - 1 x daily - 7 x weekly - 1 sets - 10 reps - 5 sec hold - Supine Chin Tuck  - 1 x daily - 7 x weekly - 1 sets - 10 reps - 5 sec  hold - Seated Knee Extension AROM  - 1 x daily - 7 x weekly - 1 sets - 10 reps - Seated Straight Leg Raise  - 1 x daily - 7 x weekly - 1 sets - 10 reps - Sit to Stand  - 1 x daily - 7 x weekly - 1 sets - 10 reps - Sidelying Thoracic Rotation with Open Book  - 1 x daily - 7 x weekly - 1 sets - 10 reps -  sec hold  Patient Education - [facility]   ASSESSMENT:  CLINICAL IMPRESSION: Good improvement in Rt sidebending today, but pain continues to be highly variable at times.  Will continue to benefit from PT to maximize function.   OBJECTIVE IMPAIRMENTS: Abnormal gait, decreased activity tolerance, decreased balance, decreased endurance, decreased mobility, difficulty walking, decreased ROM, decreased strength, impaired UE functional use, postural dysfunction, and pain.   ACTIVITY LIMITATIONS: carrying, lifting, bending, sitting, standing, squatting, sleeping, transfers, bed mobility, bathing, toileting, dressing, hygiene/grooming, and locomotion level  PARTICIPATION LIMITATIONS: meal prep, cleaning, laundry, driving, shopping, and community activity  PERSONAL FACTORS: Age, Past/current experiences, Time since onset of injury/illness/exacerbation, and 3+ comorbidities: Anemia, anxiety, arthritis, cardiomyopathy, HLD, HTN, spinal stenosis, lumbar laminectomy  are also affecting patient's functional outcome.   REHAB POTENTIAL: Fair multiple co morbidities, several episodes of prior  PT  CLINICAL DECISION MAKING: Evolving/moderate complexity  EVALUATION COMPLEXITY: Moderate   GOALS: Goals reviewed with patient? Yes  SHORT TERM GOALS: Target date: 01/30/2023  Independent with initial HEP Goal status: Met 01/15/23  2.  Improve 5x STS to < 42 sec for improved function and mobility Goal status: unable 01/15/23  3.  Rt cervical sidebending improved to at least 20 deg for improved function Goal status: MET 01/15/23   LONG TERM GOALS: Target date: 02/27/2023    Independent with final HEP Goal status: INITIAL  2.  FOTO score improved to 35 Goal status: INITIAL  3.  5x sit to stand improved to < 35 sec for improved functional strength and mobility Goal status: INIITAL  4.  Report pain < 4/10 with standing and walking for improved function Goal status: INITIAL  5.  Rt cervical side bending improved to at least 25 deg for improved mobility Goal status: INITIAL   PLAN:  PT FREQUENCY: 1-2x/week  PT DURATION: 8 weeks  PLANNED INTERVENTIONS: Therapeutic exercises, Therapeutic activity, Neuromuscular re-education, Balance training, Gait training, Patient/Family education, Self Care, Joint mobilization, DME instructions, Aquatic Therapy, Dry Needling, Electrical stimulation, Spinal manipulation, Spinal mobilization, Cryotherapy, Moist heat, Taping, Traction, Manual therapy, and Re-evaluation.  PLAN FOR NEXT SESSION: work on postural exercises, strengthening, land and aquatics for strengthening as tolerated, manual/modalities PRN   NEXT MD VISIT: PRN  Clarita Crane, PT, DPT 01/15/23 12:21 PM

## 2023-01-19 ENCOUNTER — Telehealth: Payer: Self-pay | Admitting: Physical Therapy

## 2023-01-19 ENCOUNTER — Ambulatory Visit: Payer: Medicare HMO | Admitting: Physical Therapy

## 2023-01-19 NOTE — Telephone Encounter (Signed)
Pt did not show for PT appointment today. They were contacted and informed of this. It was difficult to hear her on the phone but is sounded like she said she was not feeling well and was going to go see the doctor today. They were provided the date and time of their next appointment.  Ivery Quale, PT, DPT 01/19/23 9:52 AM

## 2023-01-22 ENCOUNTER — Encounter: Payer: Medicare HMO | Admitting: Physical Therapy

## 2023-01-24 ENCOUNTER — Ambulatory Visit: Payer: Medicare HMO | Attending: Cardiology | Admitting: Cardiology

## 2023-01-24 ENCOUNTER — Encounter: Payer: Self-pay | Admitting: Cardiology

## 2023-01-24 ENCOUNTER — Other Ambulatory Visit: Payer: Self-pay | Admitting: Physician Assistant

## 2023-01-24 VITALS — BP 122/78 | HR 67 | Resp 16 | Ht 69.0 in | Wt 240.0 lb

## 2023-01-24 DIAGNOSIS — I5032 Chronic diastolic (congestive) heart failure: Secondary | ICD-10-CM

## 2023-01-24 DIAGNOSIS — I10 Essential (primary) hypertension: Secondary | ICD-10-CM | POA: Diagnosis not present

## 2023-01-24 DIAGNOSIS — E782 Mixed hyperlipidemia: Secondary | ICD-10-CM

## 2023-01-24 DIAGNOSIS — Z8673 Personal history of transient ischemic attack (TIA), and cerebral infarction without residual deficits: Secondary | ICD-10-CM | POA: Diagnosis not present

## 2023-01-24 DIAGNOSIS — Z6835 Body mass index (BMI) 35.0-35.9, adult: Secondary | ICD-10-CM

## 2023-01-24 DIAGNOSIS — M0579 Rheumatoid arthritis with rheumatoid factor of multiple sites without organ or systems involvement: Secondary | ICD-10-CM

## 2023-01-24 DIAGNOSIS — E66812 Obesity, class 2: Secondary | ICD-10-CM

## 2023-01-24 NOTE — Progress Notes (Signed)
Cardiology Office Note:  .   Date:  01/24/2023  ID:  Lisa Briggs, DOB 1961-03-19, MRN [id number] PCP:  Ellyn Hack, MD  Former Cardiology Providers: Altamese , APRN, FNP-C  [facility] Providers Cardiologist:  Tessa Lerner, DO , [facility] (established care 07/27/2022) Electrophysiologist:  None  Click to update primary MD,subspecialty MD or APP then REFRESH:1}    Chief Complaint  Patient presents with   Follow-up    6 months-HFpEF    History of Present Illness: .   Lisa Briggs is a 62 y.o. African-American female whose past medical history and cardiovascular risk factors includes: Seropositive rheumatoid arthritis (Deveshwar), osteoarthritis, DDD, benign essential hypertension, transient ischemic attack, chronic HFpEF, obesity.   Patient being followed by the practice given her history of mildly reduced LVEF and multiple cardiovascular risk factors.  She presents today for 60-month follow-up visit.  Denies anginal chest pain or heart failure symptoms.  No hospitalizations or urgent care visits for cardiac reasons since the last office encounter.  Review of Systems: .   Review of Systems  Cardiovascular:  Negative for chest pain, dyspnea on exertion, leg swelling, orthopnea, palpitations, paroxysmal nocturnal dyspnea and syncope.  Respiratory:  Negative for shortness of breath.   Musculoskeletal:  Positive for joint pain (right knee).    Studies Reviewed:   EKG: EKG Interpretation Date/Time:  Wednesday January 24 2023 08:49:18 EDT Ventricular Rate:  67 PR Interval:  160 QRS Duration:  94 QT Interval:  448 QTC Calculation: 473 R Axis:   49  Text Interpretation: Normal sinus rhythm When compared with ECG of 28-Aug-2021 17:10, No significant change since last tracing Confirmed by Tessa Lerner [phone]) on 01/24/2023 8:58:47 AM  Echocardiogram: January 2021: LVEF 45-50%, grade 2 diastolic dysfunction, see report for additional details  March 2021: LVEF  45-50%, grade 1 diastolic dysfunction, see report for additional details  April 2024: LVEF 45-50%, mild LVH, global hypokinesis, grade 1 diastolic dysfunction. Mild MR, mild TR. Trace pericardial effusion.  Stress Testing: MPI April 2021: Low risk study.  See report for additional details  RADIOLOGY: N/A  Risk Assessment/Calculations:   N/A   Labs:       Latest Ref Rng & Units 12/20/2022   12:14 PM 07/18/2022   11:12 AM 01/18/2022   11:59 AM  CBC  WBC 3.8 - 10.8 Thousand/uL 6.4  6.3  6.0   Hemoglobin 11.7 - 15.5 g/dL 95.2  84.1  32.4   Hematocrit 35.0 - 45.0 % 41.6  41.0  37.8   Platelets 140 - 400 Thousand/uL 298  303  308        Latest Ref Rng & Units 12/20/2022   12:14 PM 07/18/2022   11:12 AM 06/29/2022    1:30 PM  BMP  Glucose 65 - 99 mg/dL 81  81  81   BUN 7 - 25 mg/dL 13  11  12    Creatinine 0.50 - 1.05 mg/dL 4.01  0.27  2.53   BUN/Creat Ratio 6 - 22 (calc) SEE NOTE:  SEE NOTE:  15   Sodium 135 - 146 mmol/L 142  144  144   Potassium 3.5 - 5.3 mmol/L 4.7  4.6  4.7   Chloride 98 - 110 mmol/L 105  108  105   CO2 20 - 32 mmol/L 31  29  25    Calcium 8.6 - 10.4 mg/dL 9.6  9.1  9.4       Latest Ref Rng & Units 12/20/2022   12:14  PM 07/18/2022   11:12 AM 06/29/2022    1:30 PM  CMP  Glucose 65 - 99 mg/dL 81  81  81   BUN 7 - 25 mg/dL 13  11  12    Creatinine 0.50 - 1.05 mg/dL 1.61  0.96  0.45   Sodium 135 - 146 mmol/L 142  144  144   Potassium 3.5 - 5.3 mmol/L 4.7  4.6  4.7   Chloride 98 - 110 mmol/L 105  108  105   CO2 20 - 32 mmol/L 31  29  25    Calcium 8.6 - 10.4 mg/dL 9.6  9.1  9.4   Total Protein 6.1 - 8.1 g/dL 6.7  6.5    Total Bilirubin 0.2 - 1.2 mg/dL 0.3  0.3    AST 10 - 35 U/L 20  17    ALT 6 - 29 U/L 17  15      Lab Results  Component Value Date   CHOL 148 01/27/2021   HDL 65 01/27/2021   LDLCALC 71 01/27/2021   TRIG 58 01/27/2021   CHOLHDL 2.3 01/27/2021   No results for input(s): "LIPOA" in the last 8760 hours. No components found for:  "NTPROBNP" No results for input(s): "PROBNP" in the last 8760 hours. No results for input(s): "TSH" in the last 8760 hours.   Physical Exam:    Today's Vitals   01/24/23 0846  BP: 122/78  Pulse: 67  Resp: 16  SpO2: 96%  Weight: 240 lb (108.9 kg)  Height: 5\' 9"  (1.753 m)   Body mass index is 35.44 kg/m. Wt Readings from Last 3 Encounters:  01/24/23 240 lb (108.9 kg)  12/20/22 235 lb 12.8 oz (107 kg)  07/27/22 236 lb (107 kg)    Physical Exam  Constitutional: No distress.  Age appropriate, hemodynamically stable, ambulates with a cane.  Neck: No JVD present.  Cardiovascular: Normal rate, regular rhythm, S1 normal, S2 normal, intact distal pulses and normal pulses. Exam reveals no gallop, no S3 and no S4.  No murmur heard. Pulmonary/Chest: Effort normal and breath sounds normal. No stridor. She has no wheezes. She has no rales.  Abdominal: Soft. Bowel sounds are normal. She exhibits no distension. There is no abdominal tenderness.  Musculoskeletal:        General: No edema.     Cervical back: Neck supple.  Neurological: She is alert and oriented to person, place, and time. She has intact cranial nerves (2-12).  Skin: Skin is warm and moist.     Impression & Recommendation(s):  Impression:   ICD-10-CM   1. Chronic heart failure with preserved ejection fraction (HCC)  I50.32 EKG 12-Lead    2. Benign hypertension  I10     3. Mixed hyperlipidemia  E78.2 LDL cholesterol, direct    Lipid panel    Comprehensive metabolic panel    Comprehensive metabolic panel    Lipid panel    LDL cholesterol, direct    4. History of TIA (transient ischemic attack)  Z86.73     5. Class 2 severe obesity due to excess calories with serious comorbidity and body mass index (BMI) of 35.0 to 35.9 in adult [facility])  W09.811    E66.01    Z68.35        Recommendation(s):  Chronic heart failure with preserved ejection fraction (HCC) Stage B, NYHA class II/III. Echo April 2024: LVEF 45-50%,  grade 1 diastolic dysfunction, mild valvular heart disease, see report for additional details Continue carvedilol 6.25 mg p.o.  twice daily. Continue Entresto 49/51 mg p.o. twice daily. Continue spironolactone 25 mg p.o. daily. Continue Farxiga 10 mg p.o. daily.  Benign hypertension Office blood pressures are very well-controlled on current medical therapy. No changes warranted at this time.  Medications as discussed above  Mixed hyperlipidemia Currently on Crestor 20 mg p.o. daily.   She denies myalgia or other side effects. Will check fasting lipid profile and direct LDL.  History of TIA (transient ischemic attack) Reemphasized the importance of secondary prevention with focus on improving her modifiable cardiovascular risk factors such as glycemic control, lipid management, blood pressure control, weight loss.  Class 2 severe obesity due to excess calories with serious comorbidity and body mass index (BMI) of 35.0 to 35.9 in adult [facility])  Body mass index is 35.44 kg/m. Patient has gained weight since last office visit. She is cognizant of the change and is motivated to lose weight. Educated her on the importance of dietary changes and slowly increasing physical activity as tolerated. I have asked her to follow-up with PCP and consider weight loss management clinic if needed. I reviewed with her importance of diet, regular physical activity/exercise, weight loss.   Patient is educated on the importance of increasing physical activity gradually as tolerated with a goal of moderate intensity exercise for 30 minutes a day 5 days a week.   Orders Placed:  Orders Placed This Encounter  Procedures   LDL cholesterol, direct    Standing Status:   Future    Number of Occurrences:   1    Standing Expiration Date:   01/24/2024   Lipid panel    Standing Status:   Future    Number of Occurrences:   1    Standing Expiration Date:   01/24/2024   Comprehensive metabolic panel    Standing  Status:   Future    Number of Occurrences:   1    Standing Expiration Date:   01/24/2024   EKG 12-Lead    As part of medical decision making EKG, prior echo and stress test results were reviewed at today's office visit.  Final Medication List:   No orders of the defined types were placed in this encounter.   Medications Discontinued During This Encounter  Medication Reason   Fish Oil-Cholecalciferol (OMEGA-3 & VITAMIN D3 GUMMIES PO)    Fish Oil-Cholecalciferol (OMEGA-3 + VITAMIN D3 PO)      Current Outpatient Medications:    albuterol (VENTOLIN HFA) 108 (90 Base) MCG/ACT inhaler, SMARTSIG:2 Puff(s) By Mouth Every 6 Hours PRN, Disp: , Rfl:    blood glucose meter kit and supplies KIT, Test once daily-DX-E11.69, Disp: 1 each, Rfl: 0   carvedilol (COREG) 6.25 MG tablet, Take 1 tablet (6.25 mg total) by mouth 2 (two) times daily., Disp: 180 tablet, Rfl: 3   Cholecalciferol (VITAMIN D3) 125 MCG (5000 UT) CAPS, Take 2,000 Units by mouth daily., Disp: , Rfl:    Cyanocobalamin (VITAMIN B-12 PO), Take by mouth daily., Disp: , Rfl:    dexlansoprazole (DEXILANT) 60 MG capsule, Take 60 mg by mouth daily., Disp: , Rfl:    diclofenac Sodium (VOLTAREN) 1 % GEL, APPLY 2 GRAMS TOPICALLY 4 TIMES DAILY, Disp: 100 g, Rfl: 0   docusate sodium (COLACE) 100 MG capsule, Take 100 mg by mouth daily., Disp: , Rfl:    ENTRESTO 49-51 MG, Take 1 tablet by mouth 2 (two) times daily., Disp: , Rfl:    EQ ASPIRIN ADULT LOW DOSE 81 MG EC tablet, Take 1 tablet  by mouth once daily, Disp: 90 tablet, Rfl: 0   famotidine (PEPCID) 20 MG tablet, Take 20 mg by mouth daily., Disp: , Rfl:    FARXIGA 10 MG TABS tablet, Take 10 mg by mouth every morning., Disp: , Rfl:    fluticasone (FLONASE) 50 MCG/ACT nasal spray, Place 2 sprays into both nostrils in the morning and at bedtime., Disp: 16 mL, Rfl: 2   gabapentin (NEURONTIN) 300 MG capsule, TAKE 1 CAPSULE BY MOUTH THREE TIMES DAILY, Disp: 90 capsule, Rfl: 5   Glucose Blood (BLOOD  GLUCOSE TEST STRIPS) STRP, Inject 1 each as directed daily., Disp: 100 strip, Rfl: 5   Insulin Pen Needle (BD PEN NEEDLE NANO U/F) 32G X 4 MM MISC, 1 each by Does not apply route at bedtime., Disp: 100 each, Rfl: 11   latanoprost (XALATAN) 0.005 % ophthalmic solution, Place 1 drop into both eyes at bedtime., Disp: , Rfl:    lidocaine (LIDODERM) 5 %, Place 1 patch onto the skin daily. Remove & Discard patch within 12 hours or as directed by MD, Disp: 15 patch, Rfl: 0   linaclotide (LINZESS) 290 MCG CAPS capsule, Take 1 capsule (290 mcg total) by mouth daily before breakfast., Disp: 90 capsule, Rfl: 3   Misc. Devices (ROLLATOR ULTRA-LIGHT) MISC, Use as directed., Disp: 1 each, Rfl: 0   Multiple Vitamin (MULTIVITAMIN PO), Take by mouth daily., Disp: , Rfl:    Omega-3 Fatty Acids (OMEGA-3 FISH OIL PO), Take 690 mg by mouth daily., Disp: , Rfl:    ondansetron (ZOFRAN) 4 MG tablet, TAKE 1 TABLET BY MOUTH EVERY 8 HOURS AS NEEDED FOR NAUSEA OR VOMITING, Disp: 90 tablet, Rfl: 0   pantoprazole (PROTONIX) 40 MG tablet, Take 1 tablet by mouth once daily, Disp: 90 tablet, Rfl: 0   potassium chloride (KLOR-CON) 10 MEQ tablet, Take 1 tablet by mouth twice daily, Disp: 180 tablet, Rfl: 0   pyridOXINE (VITAMIN B6) 100 MG tablet, Take 100 mg by mouth daily., Disp: , Rfl:    RESTASIS 0.05 % ophthalmic emulsion, 1 drop 2 (two) times daily., Disp: , Rfl:    rosuvastatin (CRESTOR) 20 MG tablet, Take 1 tablet (20 mg total) by mouth daily., Disp: 90 tablet, Rfl: 3   spironolactone (ALDACTONE) 25 MG tablet, Take 1 tablet (25 mg total) by mouth daily., Disp: 90 tablet, Rfl: 1   TURMERIC PO, Take 500 mg by mouth., Disp: , Rfl:    venlafaxine XR (EFFEXOR-XR) 37.5 MG 24 hr capsule, TAKE 1 CAPSULE DAILY WITH BREAKFAST., Disp: 90 capsule, Rfl: 0   Vitamin A 3 MG (10000 UT) TABS, Take by mouth., Disp: , Rfl:    hydroxychloroquine (PLAQUENIL) 200 MG tablet, Take 1 tablet by mouth twice daily, Disp: 180 tablet, Rfl: 0  Consent:    N/A  Disposition:   1 year sooner if needed. Patient may be asked to follow-up sooner based on the results of the above-mentioned testing.  Her questions and concerns were addressed to her satisfaction. She voices understanding of the recommendations provided during this encounter.    Signed, Tessa Lerner, DO, [facility] [place]  [facility]  [address] [place], [place] [address] 01/24/2023 10:38 AM

## 2023-01-24 NOTE — Telephone Encounter (Signed)
Last Fill: 10/30/2022  Eye exam: 10/31/2022 WNL    Labs: 12/20/2022 CBC and CMP normal.   Next Visit: Due October 2024. Message sent to the front to schedule.   Last Visit: 12/20/2022  [id number] arthritis involving multiple sites with positive rheumatoid factor   Current Dose per office note 12/20/2022: Plaquenil 200 mg 1 tablet by mouth twice daily.   Okay to refill Plaquenil?

## 2023-01-24 NOTE — Telephone Encounter (Signed)
Please schedule patient a follow up visit. Patient due October 2024. Thanks!   Return in about 4 weeks (around 01/17/2023) for Rheumatoid arthritis, Osteoarthritis.

## 2023-01-24 NOTE — Patient Instructions (Signed)
Medication Instructions:  Your physician recommends that you continue on your current medications as directed. Please refer to the Current Medication list given to you today.  *If you need a refill on your cardiac medications before your next appointment, please call your pharmacy*  Lab Work: TODAY - FASTING Lipid panel, Direct LDL, CMP  If you have labs (blood work) drawn today and your tests are completely normal, you will receive your results only by: MyChart Message (if you have MyChart) OR A paper copy in the mail If you have any lab test that is abnormal or we need to change your treatment, we will call you to review the results.  Testing/Procedures: None ordered today.  Follow-Up: At [facility], you and your health needs are our priority.  As part of our continuing mission to provide you with exceptional heart care, we have created designated Provider Care Teams.  These Care Teams include your primary Cardiologist (physician) and Advanced Practice Providers (APPs -  Physician Assistants and Nurse Practitioners) who all work together to provide you with the care you need, when you need it.  We recommend signing up for the patient portal called "MyChart".  Sign up information is provided on this After Visit Summary.  MyChart is used to connect with patients for Virtual Visits (Telemedicine).  Patients are able to view lab/test results, encounter notes, upcoming appointments, etc.  Non-urgent messages can be sent to your provider as well.   To learn more about what you can do with MyChart, go to [email or web address].    Your next appointment:   1 year(s)  The format for your next appointment:   In Person  Provider:   Tessa Lerner, DO {

## 2023-01-25 ENCOUNTER — Encounter: Payer: Self-pay | Admitting: Physical Therapy

## 2023-01-25 ENCOUNTER — Ambulatory Visit (INDEPENDENT_AMBULATORY_CARE_PROVIDER_SITE_OTHER): Payer: Medicare HMO | Admitting: Physical Therapy

## 2023-01-25 ENCOUNTER — Ambulatory Visit: Payer: Medicare HMO | Admitting: Cardiology

## 2023-01-25 DIAGNOSIS — G8929 Other chronic pain: Secondary | ICD-10-CM | POA: Diagnosis not present

## 2023-01-25 DIAGNOSIS — M542 Cervicalgia: Secondary | ICD-10-CM

## 2023-01-25 DIAGNOSIS — M6281 Muscle weakness (generalized): Secondary | ICD-10-CM

## 2023-01-25 DIAGNOSIS — M25561 Pain in right knee: Secondary | ICD-10-CM | POA: Diagnosis not present

## 2023-01-25 LAB — COMPREHENSIVE METABOLIC PANEL
ALT: 18 [IU]/L (ref 0–32)
AST: 21 [IU]/L (ref 0–40)
Albumin: 4 g/dL (ref 3.8–4.9)
Alkaline Phosphatase: 110 [IU]/L (ref 44–121)
BUN/Creatinine Ratio: 19 (ref 12–28)
BUN: 15 mg/dL (ref 8–27)
Bilirubin Total: <0.2 mg/dL (ref 0.0–1.2)
CO2: 27 mmol/L (ref 20–29)
Calcium: 9.4 mg/dL (ref 8.7–10.3)
Chloride: 106 mmol/L (ref 96–106)
Creatinine, Ser: 0.81 mg/dL (ref 0.57–1.00)
Globulin, Total: 2.7 g/dL (ref 1.5–4.5)
Glucose: 83 mg/dL (ref 70–99)
Potassium: 4.1 mmol/L (ref 3.5–5.2)
Sodium: 143 mmol/L (ref 134–144)
Total Protein: 6.7 g/dL (ref 6.0–8.5)
eGFR: 83 mL/min/{1.73_m2} (ref >59)

## 2023-01-25 LAB — LIPID PANEL
Chol/HDL Ratio: 1.9 ratio (ref 0.0–4.4)
Cholesterol, Total: 145 mg/dL (ref 100–199)
HDL: 76 mg/dL (ref >39)
LDL Chol Calc (NIH): 57 mg/dL (ref 0–99)
Triglycerides: 58 mg/dL (ref 0–149)
VLDL Cholesterol Cal: 12 mg/dL (ref 5–40)

## 2023-01-25 LAB — LDL CHOLESTEROL, DIRECT: LDL Direct: 55 mg/dL (ref 0–99)

## 2023-01-25 NOTE — Therapy (Addendum)
OUTPATIENT PHYSICAL THERAPY TREATMENT / DISCHARGE   Patient Name: Lisa Briggs MRN: [id number] DOB:08-14-1960, 62 y.o., female Today's Date: 01/25/2023  END OF SESSION:  PT End of Session - 01/25/23 1305     Visit Number 6    Number of Visits 16    Date for PT Re-Evaluation 02/27/23    Authorization Type [employer] $20 copay    Progress Note Due on Visit 10    PT Start Time 1305    PT Stop Time 1345    PT Time Calculation (min) 40 min    Activity Tolerance Patient tolerated treatment well    Behavior During Therapy [facility] for tasks assessed/performed                 Past Medical History:  Diagnosis Date   Allergy    Anemia    iron therapy for years as of 10/12; normal Hgb 08/2013   Anxiety    Arthritis    Cardiomyopathy (HCC)    Chest pain 04/05/2011   cardiac eval, normal treadmill stress test, Dr. 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 Axel Filler, [facility]   GERD (gastroesophageal reflux disease)    History of uterine fibroid    Hyperlipidemia    Hypertension    Paresthesia 09/2014   initially thought to be TIA, neurology consult in 12/2014 with other non TIA considerations.     Polyarthralgia    normal rheumatoid screen 01/2012   TIA (transient ischemic attack) 2015/16   Past Surgical History:  Procedure Laterality Date   COLONOSCOPY  01/2014   diverticulosis, othwerise normal - Dr. Rob Bunting   DILATATION & CURETTAGE/HYSTEROSCOPY WITH MYOSURE N/A 07/14/2021   Procedure: DILATATION & CURETTAGE/HYSTEROSCOPY WITH MYOSURE Reach;  Surgeon: Shea Evans, MD;  Location: [facility];  Service: Gynecology;  Laterality: N/A;   ESOPHAGOGASTRODUODENOSCOPY  2013   Dr. Elnoria Howard, gastritis   ESOPHAGOGASTRODUODENOSCOPY  [id number]   EUS N/A 04/16/2014   Procedure:  UPPER ENDOSCOPIC ULTRASOUND (EUS) LINEAR;  Surgeon: Rachael Fee, MD;  Location: [facility];  Service: Endoscopy;  Laterality: N/A;   gall stone surgery     KNEE ARTHROSCOPY Left    LAPAROSCOPIC GASTRIC RESECTION N/A 06/09/2014   Procedure: LAPAROSCOPIC GASTRIC MASS RESECTION;  Surgeon: Axel Filler, MD;  Location: [facility];  Service: General;  Laterality: N/A;   LIPOMA EXCISION     forehead   LUMBAR LAMINECTOMY N/A 04/25/2019   Procedure: LEFT L2-3 MICRODISCECTOMY, BILATERAL L5-S1 PARTIAL HEMILAMINECTOMY;  Surgeon: Kerrin Champagne, MD;  Location: [facility];  Service: Orthopedics;  Laterality: N/A;   UPPER GASTROINTESTINAL ENDOSCOPY     UTERINE FIBROID SURGERY     Patient Active Problem List   Diagnosis Date Noted   Unilateral primary osteoarthritis, right knee 05/04/2022   Bilateral ankle pain 09/15/2021   Chronic HFrEF (heart failure with reduced ejection fraction) (HCC) 07/29/2021   Advance directive discussed with patient 01/27/2021   Medicare annual wellness visit, subsequent 01/27/2021   Needs flu shot 01/27/2021   At moderate risk for fall 01/27/2021   Prediabetes 02/19/2020   Menopause 11/25/2019   Cardiomyopathy (HCC) 11/19/2019   Rheumatoid arthritis involving multiple sites with positive rheumatoid factor (HCC) 11/19/2019   Primary osteoarthritis involving multiple joints 11/19/2019   Greater trochanteric bursitis, right  11/19/2019   Decreased activities of daily living (ADL) 11/19/2019   Gait disturbance 11/19/2019   Use of cane as ambulatory aid 11/19/2019   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    Class: Chronic   Spinal stenosis of lumbar region 04/25/2019    Class: Chronic   Status post lumbar laminectomy 04/25/2019   Chronic pain of right knee 09/13/2018   Chronic pain of left knee 07/08/2018   Chronic hip pain 07/08/2018   Edema 07/08/2018   Need for shingles vaccine 12/05/2017   Primary osteoarthritis of both hands 11/23/2017   Primary  osteoarthritis of both knees 11/23/2017   Primary osteoarthritis of both feet 11/23/2017   DDD (degenerative disc disease), lumbar 11/23/2017   Vaccine counseling 08/13/2017   Sensitive skin 01/23/2017   Need for influenza vaccination 01/23/2017   History of gastrointestinal stromal tumor (GIST) 04/20/2016   History of TIA (transient ischemic attack) 04/20/2016   Screening for breast cancer 04/20/2016   Estrogen deficiency 04/20/2016   Impaired fasting blood sugar 04/20/2016   Constipation 04/20/2016   History of fall 04/20/2016   Chronic radicular lumbar pain 01/27/2016   Encounter for health maintenance examination in adult 03/22/2015   Paresthesia 03/22/2015   Cognitive decline 03/22/2015   Screening for cervical cancer 03/22/2015   Vitamin D deficiency 03/22/2015   History of uterine leiomyoma 03/22/2015   Gastroesophageal reflux disease without esophagitis 03/02/2014   Chronic nausea 03/02/2014   Rhinitis, allergic 03/02/2014   Essential hypertension 03/02/2014   Hyperlipidemia 03/02/2014   Obesity with serious comorbidity 01/16/2012    PCP: Ellyn Hack, MD  REFERRING PROVIDER: Pollyann Savoy, MD  REFERRING DIAG: M25.361 (ICD-10-CM) - Instability of right knee joint M54.2 (ICD-10-CM) - Neck pain  Rationale for Evaluation and Treatment: Rehabilitation  THERAPY DIAG:  Chronic pain of right knee  Muscle weakness (generalized)  Cervicalgia  ONSET DATE: chronic (date of referral: 12/20/22)   SUBJECTIVE:                                                                                                                                                                                           SUBJECTIVE STATEMENT: Rt shoulder still painful  PERTINENT HISTORY:  Anemia, anxiety, arthritis, cardiomyopathy, HLD, HTN, spinal stenosis, lumbar laminectomy  PAIN:  Are you having pain? Yes: NPRS scale: 4/10 Pain location: Rt neck down arm and side; to Rt knee Pain  description: sharp, shooting, dull, aching Aggravating factors: sitting, walking, lifting groceries Relieving factors: forward bending, "I rub down" and medications   PRECAUTIONS:  None  RED FLAGS: None   WEIGHT BEARING RESTRICTIONS:  No  FALLS:  Has patient fallen in last 6 months? No  LIVING ENVIRONMENT: Lives with: lives with their family (husband and adult son) Lives in: House/apartment Stairs: No Has following equipment at home: Single point cane and has a rollator ordered  OCCUPATION:  On disability  PLOF:  Independent and Leisure: watch TV/phone, does some stretching  PATIENT GOALS:  "I want to be back to 100%"  OBJECTIVE:   DIAGNOSTIC FINDINGS:  Advanced OA in Rt knee  PATIENT SURVEYS:  01/02/23: FOTO 31 (predicted 35) 01/25/23: FOTO 38  COGNITIVE STATUS: Within functional limits for tasks assessed   SENSATION: WFL  POSTURE:  rounded shoulders, forward head, and increased lumbar lordosis  HAND DOMINANCE:  Right  GAIT: 01/02/23 Distance walked: 100 Assistive device utilized: Single point cane Level of assistance: Modified independence Comments: antalgic gait; decreased stance on Rt (using cane in Rt hand)   PALPATION: 01/02/23: diffuse tenderness along Rt upper traps and shoulder  CERVICAL ROM:   Active ROM A/PROM (deg) eval AROM 01/15/23  Flexion 48   Extension 38   Right lateral flexion 15 28  Left lateral flexion 30 (with Rt side pulling)   Right rotation    Left rotation     (Blank rows = not tested)   UPPER EXTREMITY ROM: 01/02/23: bil UE ROM WNL just pain with all motions   UPPER EXTREMITY MMT:  MMT Right eval Left eval  Shoulder flexion 3/5 3+/5  Shoulder extension    Shoulder abduction 3/5 3+/5  Shoulder adduction    Shoulder extension    Shoulder internal rotation 4/5 5/5  Shoulder external rotation 4/5 4/5   (Blank rows = not tested)   LOWER EXTREMITY ROM:     Active  Right eval Left eval  Knee extension  -1 0   (Blank rows = not tested)   LOWER EXTREMITY MMT:    MMT Right eval Left eval  Hip flexion 4/5 5/5  Hip extension    Hip abduction 3/5 4/5  Hip adduction 4/5 5/5  Hip internal rotation    Hip external rotation    Knee flexion 4/5 5/5  Knee extension 4/5 5/5   (Blank rows = not tested)    SPECIAL TESTS:  01/02/23 Cervical Spurling's test: Negative and Distraction test: Negative  FUNCTIONAL TESTS:  01/02/23 5 times sit to stand: 49.97 sec without UE support 01/15/23: unable; several attempts needed to stand    TREATMENT:                                                                                                                              DATE:  01/25/23 TherEx NuStep UE/LE L5 x 8 min Rows L4 band 2x10 Shoulder extension L4 band 2x10  Modalities Vaso x 10 min, mod pressure to Rt knee; 34 deg   01/15/23 TherEx SciFit bike UE/LE L5 x 8 min BATCA rows 10# 2x10 Seated horizontal abduction 2x10; L2 band Seated ER with L2 band 2x10 Seated towel  cervical rotation stretch 5x10 sec hold bil AROM measurements - see above Attempted 5x STS - unable to complete more than 1 stand Sit to/from stand with UE support x 5 reps  Modalities Moist heat to Rt shoulder x 5 min   01/12/23 Pt seen for aquatic therapy today.  Treatment took place in water 3.5-4.75 ft in depth at the [facility] pool. Temp of water was 91.  Pt entered/exited the pool via stairs with hand rail.   Pt requires the buoyancy and hydrostatic pressure of water for support, and to offload joints by unweighting joint load by at least 50 % in navel deep water and by at least 75-80% in chest to neck deep water.  Viscosity of the water is needed for resistance of strengthening. Water current perturbations provides challenge to standing balance requiring increased core activation. Aquatic PT exercises Sidestepping length of pool 3 round trips holding pool noodle for support Forward walking  3  round trips holding pool noodle for support Backward walking  3 round trips holding pool noodle for support Leg swings hip abd/add X 15 bilat Leg swings hip flexion/extension X 15 bilat Standing hamstring curls X 15 bilat Hip circles X 15 CW and CCW each, bilat Marches X 15 bilat 1/4 squat with back against wall, shoulder extension X 15 with kickboard 1/4 lunge stance with back against wall, push/pull kickboard X15 bilat Step ups at pool stairs on first step X 10 bilat with UE support  01/09/23 TherEx NuStep UE/LE L6 x 8 min Neck half circles x 10 reps each direction Seated cervical rotation x10 reps bil; 5 sec hold Seated cervical retraction x 10 reps; 5 sec hold Seated SLR x10 reps bil Sit to/from stand x 10 reps - elevated surface; no UEs  01/05/23 Pt seen for aquatic therapy today.  Treatment took place in water 3.5-4.75 ft in depth at the [facility] pool. Temp of water was 91.  Pt entered/exited the pool via stairs with hand rail.   Pt requires the buoyancy and hydrostatic pressure of water for support, and to offload joints by unweighting joint load by at least 50 % in navel deep water and by at least 75-80% in chest to neck deep water.  Viscosity of the water is needed for resistance of strengthening. Water current perturbations provides challenge to standing balance requiring increased core activation. Aquatic PT exercises Sidestepping length of pool 3 round trips holding pool noodle for support Forward walking  3 round trips holding pool noodle for support Leg swings hip abd/add X 15 bilat Leg swings hip flexion/extension X 15 bilat Standing hamstring curls X 15 bilat Hip circles X 15 CW and CCW each, bilat Marches X 15 bilat 1/4 squat with back against wall, shoulder extension X 15 with kickboard 1/4 lunge stance with back against wall, push/pull kickboard X15 bilat Squat with shoulder adduction using water dumbells X 15 Step ups at pool stairs on first step X 10  bilat with UE support    PATIENT EDUCATION:  Education details: HEP, aquatics Person educated: Patient Education method: Programmer, multimedia, Facilities manager, and Handouts Education comprehension: verbalized understanding, returned demonstration, and needs further education  HOME EXERCISE PROGRAM: Access Code: [email or web address] URL: [email or web address] Date: 01/02/2023 Prepared by: Moshe Cipro  Exercises - Seated Neck AROM Half Circles  - 1 x daily - 7 x weekly - 1 sets - 10 reps - Seated Cervical Rotation AROM  - 1 x daily - 7 x weekly - 1 sets - 10 reps -  5 sec hold - Supine Chin Tuck  - 1 x daily - 7 x weekly - 1 sets - 10 reps - 5 sec hold - Seated Knee Extension AROM  - 1 x daily - 7 x weekly - 1 sets - 10 reps - Seated Straight Leg Raise  - 1 x daily - 7 x weekly - 1 sets - 10 reps - Sit to Stand  - 1 x daily - 7 x weekly - 1 sets - 10 reps - Sidelying Thoracic Rotation with Open Book  - 1 x daily - 7 x weekly - 1 sets - 10 reps -  sec hold  Patient Education - [facility]   ASSESSMENT:  CLINICAL IMPRESSION: Pt did meet her FOTO goal today despite reports that PT has not been very beneficial. Trial of vaso to help with Rt knee pain and subjective reports of swelling (not formally assessed today).  Plan to wrap up with PT next week with transition to HEP at that time. Did recommend she join [employer] through [employer] for continued aquatic exercise.   OBJECTIVE IMPAIRMENTS: Abnormal gait, decreased activity tolerance, decreased balance, decreased endurance, decreased mobility, difficulty walking, decreased ROM, decreased strength, impaired UE functional use, postural dysfunction, and pain.   ACTIVITY LIMITATIONS: carrying, lifting, bending, sitting, standing, squatting, sleeping, transfers, bed mobility, bathing, toileting, dressing, hygiene/grooming, and locomotion level  PARTICIPATION LIMITATIONS: meal prep, cleaning, laundry, driving, shopping, and community  activity  PERSONAL FACTORS: Age, Past/current experiences, Time since onset of injury/illness/exacerbation, and 3+ comorbidities: Anemia, anxiety, arthritis, cardiomyopathy, HLD, HTN, spinal stenosis, lumbar laminectomy  are also affecting patient's functional outcome.   REHAB POTENTIAL: Fair multiple co morbidities, several episodes of prior PT  CLINICAL DECISION MAKING: Evolving/moderate complexity  EVALUATION COMPLEXITY: Moderate   GOALS: Goals reviewed with patient? Yes  SHORT TERM GOALS: Target date: 01/30/2023  Independent with initial HEP Goal status: Met 01/15/23  2.  Improve 5x STS to < 42 sec for improved function and mobility Goal status: unable 01/15/23  3.  Rt cervical sidebending improved to at least 20 deg for improved function Goal status: MET 01/15/23   LONG TERM GOALS: Target date: 02/27/2023    Independent with final HEP Goal status: MET 01/25/23  2.  FOTO score improved to 35 Goal status: MET 01/25/23  3.  5x sit to stand improved to < 35 sec for improved functional strength and mobility Goal status: NOT MET 01/25/23  4.  Report pain < 4/10 with standing and walking for improved function Goal status: INITIAL  5.  Rt cervical side bending improved to at least 25 deg for improved mobility Goal status: MET 01/25/23   PLAN:  PT FREQUENCY: 1-2x/week  PT DURATION: 8 weeks  PLANNED INTERVENTIONS: Therapeutic exercises, Therapeutic activity, Neuromuscular re-education, Balance training, Gait training, Patient/Family education, Self Care, Joint mobilization, DME instructions, Aquatic Therapy, Dry Needling, Electrical stimulation, Spinal manipulation, Spinal mobilization, Cryotherapy, Moist heat, Taping, Traction, Manual therapy, and Re-evaluation.  PLAN FOR NEXT SESSION: plan to d/c next week    NEXT MD VISIT: PRN  Clarita Crane, PT, DPT 01/25/23 1:55 PM   PHYSICAL THERAPY DISCHARGE SUMMARY  Visits from Start of Care: 6  Current  functional level related to goals / functional outcomes: See note   Remaining deficits: See note   Education / Equipment: HEP  Patient goals were partially met. Patient is being discharged due to not returning since the last visit.  Chyrel Masson, PT, DPT, OCS, ATC 03/29/23  1:38 PM

## 2023-01-29 ENCOUNTER — Encounter: Payer: Medicare HMO | Admitting: Physical Therapy

## 2023-01-30 ENCOUNTER — Encounter: Payer: Self-pay | Admitting: *Deleted

## 2023-02-01 NOTE — Progress Notes (Signed)
Office Visit Note  [name]             Date of Birth: [birth year]           MRN: [id number]             PCP: Ellyn Hack, MD Referring: Ellyn Hack, MD Visit Date: 02/14/2023 Occupation: @GUAROCC @  Subjective Medication management  History of Present Illness: Lisa Briggs is a 62 y.o. female with seropositive rheumatoid arthritis and osteoarthritis.  She denies any joint swelling.  She states she continues to have pain and stiffness in her right knee joint and instability.  She has been using a brace.  She has seen Dr. Magnus Ivan who recommended surgery per patient.  She continues to have some stiffness and discomfort in her hands, neck and her feet.  She has been taking hydroxychloroquine 200 mg twice a day without any interruption.  She has noticed some darker pigmentation around her eyes and her face.    Activities of Daily Living:  Patient reports morning stiffness for several hours.   Patient Reports nocturnal pain.  Difficulty dressing/grooming: Reports Difficulty climbing stairs: Reports Difficulty getting out of chair: Reports Difficulty using hands for taps, buttons, cutlery, and/or writing: Reports  Review of Systems  Constitutional:  Negative for fatigue.  HENT:  Positive for mouth dryness. Negative for mouth sores.   Eyes:  Negative for dryness.  Respiratory:  Negative for shortness of breath.   Cardiovascular:  Negative for chest pain and palpitations.  Gastrointestinal:  Negative for blood in stool, constipation and diarrhea.  Endocrine: Negative for increased urination.  Genitourinary:  Negative for involuntary urination.  Musculoskeletal:  Positive for joint pain, gait problem, joint pain, joint swelling, myalgias, muscle weakness, morning stiffness, muscle tenderness and myalgias.  Skin:  Positive for rash. Negative for color change, hair loss and sensitivity to sunlight.  Allergic/Immunologic: Positive for susceptible to infections.   Neurological:  Negative for dizziness and headaches.  Hematological:  Negative for swollen glands.  Psychiatric/Behavioral:  Negative for depressed mood and sleep disturbance. The patient is not nervous/anxious.     PMFS History:  Patient Active Problem List   Diagnosis Date Noted   Unilateral primary osteoarthritis, right knee 05/04/2022   Bilateral ankle pain 09/15/2021   Chronic HFrEF (heart failure with reduced ejection fraction) (HCC) 07/29/2021   Advance directive discussed with patient 01/27/2021   Medicare annual wellness visit, subsequent 01/27/2021   Needs flu shot 01/27/2021   At moderate risk for fall 01/27/2021   Prediabetes 02/19/2020   Menopause 11/25/2019   Cardiomyopathy (HCC) 11/19/2019   Rheumatoid arthritis involving multiple sites with positive rheumatoid factor (HCC) 11/19/2019   Primary osteoarthritis involving multiple joints 11/19/2019   Greater trochanteric bursitis, right 11/19/2019   Decreased activities of daily living (ADL) 11/19/2019   Gait disturbance 11/19/2019   Use of cane as ambulatory aid 11/19/2019   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    Class: Chronic   Spinal stenosis of lumbar region 04/25/2019    Class: Chronic   Status post lumbar laminectomy 04/25/2019   Chronic pain of right knee 09/13/2018   Chronic pain of left knee 07/08/2018   Chronic hip pain 07/08/2018   Edema 07/08/2018   Need for shingles vaccine 12/05/2017   Primary osteoarthritis of both hands 11/23/2017   Primary osteoarthritis of both knees 11/23/2017   Primary osteoarthritis of both feet 11/23/2017   DDD (degenerative disc disease), lumbar 11/23/2017  Vaccine counseling 08/13/2017   Sensitive skin 01/23/2017   Need for influenza vaccination 01/23/2017   History of gastrointestinal stromal tumor (GIST) 04/20/2016   History of TIA (transient ischemic attack) 04/20/2016   Screening for breast cancer 04/20/2016   Estrogen deficiency 04/20/2016    Impaired fasting blood sugar 04/20/2016   Constipation 04/20/2016   History of fall 04/20/2016   Chronic radicular lumbar pain 01/27/2016   Encounter for health maintenance examination in adult 03/22/2015   Paresthesia 03/22/2015   Cognitive decline 03/22/2015   Screening for cervical cancer 03/22/2015   Vitamin D deficiency 03/22/2015   History of uterine leiomyoma 03/22/2015   Gastroesophageal reflux disease without esophagitis 03/02/2014   Chronic nausea 03/02/2014   Rhinitis, allergic 03/02/2014   Essential hypertension 03/02/2014   Hyperlipidemia 03/02/2014   Obesity with serious comorbidity 01/16/2012    Past Medical History:  Diagnosis Date   Allergy    Anemia    iron therapy for years as of 10/12; normal Hgb 08/2013   Anxiety    Arthritis    Cardiomyopathy (HCC)    Chest pain 04/05/2011   cardiac eval, normal treadmill stress test, Dr. 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 Axel Filler, [facility]   GERD (gastroesophageal reflux disease)    History of uterine fibroid    Hyperlipidemia    Hypertension    Paresthesia 09/2014   initially thought to be TIA, neurology consult in 12/2014 with other non TIA considerations.     Polyarthralgia    normal rheumatoid screen 01/2012   TIA (transient ischemic attack) 2015/16    Family History  Problem Relation Age of Onset   Hypertension Mother    Arthritis Mother    GER disease Mother    Glaucoma Mother    Breast cancer Mother 62   Stroke Father    Breast cancer Sister 54   Hypertension Sister    Leukemia Sister    Hypertension Sister    Colon cancer Brother 53   Diabetes Brother    Diabetes Maternal Aunt    Heart disease Maternal Aunt    Heart disease Maternal Uncle    Stroke Maternal Uncle    Heart disease Maternal Grandmother     Heart disease Maternal Grandfather    Healthy Son    Colon polyps Neg Hx    Esophageal cancer Neg Hx    Stomach cancer Neg Hx    Rectal cancer Neg Hx    Past Surgical History:  Procedure Laterality Date   COLONOSCOPY  01/2014   diverticulosis, othwerise normal - Dr. Rob Bunting   DILATATION & CURETTAGE/HYSTEROSCOPY WITH MYOSURE N/A 07/14/2021   Procedure: DILATATION & CURETTAGE/HYSTEROSCOPY WITH MYOSURE Reach;  Surgeon: Shea Evans, MD;  Location: [facility];  Service: Gynecology;  Laterality: N/A;   ESOPHAGOGASTRODUODENOSCOPY  2013   Dr. Elnoria Howard, gastritis   ESOPHAGOGASTRODUODENOSCOPY  [id number]   EUS N/A 04/16/2014   Procedure: UPPER ENDOSCOPIC ULTRASOUND (EUS) LINEAR;  Surgeon: Rachael Fee, MD;  Location: [facility];  Service: Endoscopy;  Laterality: N/A;   gall stone surgery     KNEE ARTHROSCOPY Left    LAPAROSCOPIC GASTRIC RESECTION N/A 06/09/2014   Procedure: LAPAROSCOPIC GASTRIC MASS RESECTION;  Surgeon: Axel Filler, MD;  Location: [facility];  Service: General;  Laterality: N/A;  LIPOMA EXCISION     forehead   LUMBAR LAMINECTOMY N/A 04/25/2019   Procedure: LEFT L2-3 MICRODISCECTOMY, BILATERAL L5-S1 PARTIAL HEMILAMINECTOMY;  Surgeon: Kerrin Champagne, MD;  Location: [facility];  Service: Orthopedics;  Laterality: N/A;   UPPER GASTROINTESTINAL ENDOSCOPY     UTERINE FIBROID SURGERY     Social History   Social History Narrative   Married, has 1 son in [place] and some grandchildren.  Location manager.  Active on job.  Does stretching and exercises daily as per physical therapy.  Works 12 hours daily.     Immunization History  Administered Date(s) Administered   Influenza,inj,Quad PF,6+ Mos 11/24/2013, 01/18/2015, 01/23/2017, 12/05/2017, 12/17/2018, 11/19/2019, 01/27/2021   PFIZER Comirnaty(Gray Top)Covid-19 Tri-Sucrose Vaccine 07/12/2020   PFIZER(Purple Top)SARS-COV-2 Vaccination 06/16/2019, 07/08/2019, 01/15/2020   PNEUMOCOCCAL CONJUGATE-20 07/28/2021    Pneumococcal Polysaccharide-23 05/21/2015   Tdap 01/17/2011, 06/25/2021   Zoster Recombinant(Shingrix) 10/05/2017, 12/07/2017     Objective: Vital Signs: BP 107/76 (BP Location: Left Arm, Patient Position: Sitting, Cuff Size: Large)   Pulse 69   Resp 16   Ht 5\' 8"  (1.727 m)   Wt 242 lb 12.8 oz (110.1 kg)   LMP 02/23/2015 (LMP Unknown)   BMI 36.92 kg/m    Physical Exam Vitals and nursing note reviewed.  Constitutional:      Appearance: She is well-developed.  HENT:     Head: Normocephalic and atraumatic.  Eyes:     Conjunctiva/sclera: Conjunctivae normal.  Cardiovascular:     Rate and Rhythm: Normal rate and regular rhythm.     Heart sounds: Normal heart sounds.  Pulmonary:     Effort: Pulmonary effort is normal.     Breath sounds: Normal breath sounds.  Abdominal:     General: Bowel sounds are normal.     Palpations: Abdomen is soft.  Musculoskeletal:     Cervical back: Normal range of motion.  Lymphadenopathy:     Cervical: No cervical adenopathy.  Skin:    General: Skin is warm and dry.     Capillary Refill: Capillary refill takes less than 2 seconds.     Comments: Hyperpigmentation was noted on forehead around her eyes and on her chin area.  Neurological:     Mental Status: She is alert and oriented to person, place, and time.  Psychiatric:        Behavior: Behavior normal.      Musculoskeletal Exam: She had limited lateral rotation of the cervical spine without discomfort.  There was no tenderness over thoracic or lumbar spine.  She had discomfort range of motion of her right shoulder joint but was in full range of motion.  Elbow joints, wrist joints, MCPs PIPs and DIPs were in good range of motion.  She had bilateral PIP and DIP thickening with no synovitis.  Hip joints and knee joints were in good range of motion without any warmth swelling or effusion.  She had discomfort range of motion of her right knee joint.  There was no tenderness over ankles or  MTPs.  CDAI Exam: CDAI Score: -- Patient Global: --; Provider Global: -- Swollen: --; Tender: -- Joint Exam 02/14/2023   No joint exam has been documented for this visit   There is currently no information documented on the homunculus. Go to the Rheumatology activity and complete the homunculus joint exam.  Investigation: No additional findings.  Imaging: No results found.  Recent Labs: Lab Results  Component Value Date   WBC 6.4 12/20/2022   HGB 12.8 12/20/2022  PLT 298 12/20/2022   NA 143 01/24/2023   K 4.1 01/24/2023   CL 106 01/24/2023   CO2 27 01/24/2023   GLUCOSE 83 01/24/2023   BUN 15 01/24/2023   CREATININE 0.81 01/24/2023   BILITOT <0.2 01/24/2023   ALKPHOS 110 01/24/2023   AST 21 01/24/2023   ALT 18 01/24/2023   PROT 6.7 01/24/2023   ALBUMIN 4.0 01/24/2023   CALCIUM 9.4 01/24/2023   GFRAA 115 05/11/2020    Speciality Comments: PLQ Eye Exam: 10/31/2022 WNL @ [facility] Follow up in 1 year  Procedures:  No procedures performed Allergies: Kiwi extract, Mucinex dm [dm-guaifenesin er], and Peach [prunus persica]   Assessment / Plan:     Visit Diagnoses: Rheumatoid arthritis involving multiple sites with positive rheumatoid factor (HCC) - +RF, +CCP: Patient had no synovitis on the examination.  She has been taking hydroxychloroquine 200 mg p.o. twice daily without any interruption.  She is some discomfort in her joints which probably is due to underlying osteoarthritis.  There is no history of joint swelling.  Patient has been noticing some hyperpigmentation on her face.  I discussed the association of hyperpigmentation with the hydroxychloroquine.  Although I am not sure if it is directly related.  I also discussed switching her from hydroxychloroquine to some of the medication but she is not interested.  I offered referral to dermatology but she would like to wait.  High risk medication use - Plaquenil 200 mg 1 tablet by mouth twice daily. PLQ Eye Exam:  10/31/2022.  Labs from September and October were reviewed.  Will check labs at the follow-up visit.  Annual eye examination was advised.  Information for immunization was placed in the AVS.  Primary osteoarthritis of both hands-she has bilateral PIP and DIP thickening.  Joint protection muscle strengthening was advised.  Trochanteric bursitis of both hips-she has intermittent discomfort in the trochanteric region.  IT band stretches were advised.  Primary osteoarthritis of both knees -she complains of ongoing pain and discomfort in her right knee joint.  She also gives history of instability.  She has been ambulating with the help of a walker.  Patient has seen Dr. Cleophas Dunker in the past.  Patient was evaluated by Dr. Magnus Ivan in February 2024 who suggested total knee replacement for severe end-stage osteoarthritis.  Patient states she will consider that in the future.  Instability of right knee joint-due to end-stage osteoarthritis.  Primary osteoarthritis of both feet-she continues to have some discomfort in her feet.  Closed avulsion fracture of left ankle with routine healing, subsequent encounter  Neck pain -she had limited range of motion without discomfort today.  X-rays of the cervical spine showed multilevel spondylosis with anterior osteophytes and facet joint arthropathy. referred to physical therapy at the last visit  Spondylosis of lumbar spine-chronic pain.  Osteopenia of left hip - DEXA updated on 08/05/21: The BMD measured at Femur Neck Left is 0.868 g/cm2 with a T-score of -1.2.  Calcium rich diet and vitamin D was advised.  Vitamin D deficiency  History of hyperlipidemia  History of TIA (transient ischemic attack)  Essential hypertension  Gastroesophageal reflux disease without esophagitis  Orders: No orders of the defined types were placed in this encounter.  Meds ordered this encounter  Medications   diclofenac Sodium (VOLTAREN) 1 % GEL    Sig: Apply 2-4 grams to  affected joint 4 times daily as needed.    Dispense:  400 g    Refill:  2  Follow-Up Instructions: Return in about 5 months (around 07/15/2023) for Rheumatoid arthritis, Osteoarthritis.   Pollyann Savoy, MD  Note - This record has been created using Animal nutritionist.  Chart creation errors have been sought, but may not always  have been located. Such creation errors do not reflect on  the standard of medical care.

## 2023-02-02 ENCOUNTER — Ambulatory Visit: Payer: Medicare HMO | Admitting: Physical Therapy

## 2023-02-14 ENCOUNTER — Encounter: Payer: Self-pay | Admitting: Rheumatology

## 2023-02-14 ENCOUNTER — Ambulatory Visit: Payer: Medicare HMO | Attending: Rheumatology | Admitting: Rheumatology

## 2023-02-14 VITALS — BP 107/76 | HR 69 | Resp 16 | Ht 68.0 in | Wt 242.8 lb

## 2023-02-14 DIAGNOSIS — M85852 Other specified disorders of bone density and structure, left thigh: Secondary | ICD-10-CM

## 2023-02-14 DIAGNOSIS — M170 Bilateral primary osteoarthritis of knee: Secondary | ICD-10-CM

## 2023-02-14 DIAGNOSIS — I10 Essential (primary) hypertension: Secondary | ICD-10-CM

## 2023-02-14 DIAGNOSIS — M67432 Ganglion, left wrist: Secondary | ICD-10-CM

## 2023-02-14 DIAGNOSIS — S82892D Other fracture of left lower leg, subsequent encounter for closed fracture with routine healing: Secondary | ICD-10-CM

## 2023-02-14 DIAGNOSIS — M7062 Trochanteric bursitis, left hip: Secondary | ICD-10-CM

## 2023-02-14 DIAGNOSIS — M19072 Primary osteoarthritis, left ankle and foot: Secondary | ICD-10-CM

## 2023-02-14 DIAGNOSIS — M0579 Rheumatoid arthritis with rheumatoid factor of multiple sites without organ or systems involvement: Secondary | ICD-10-CM

## 2023-02-14 DIAGNOSIS — M19071 Primary osteoarthritis, right ankle and foot: Secondary | ICD-10-CM

## 2023-02-14 DIAGNOSIS — K219 Gastro-esophageal reflux disease without esophagitis: Secondary | ICD-10-CM

## 2023-02-14 DIAGNOSIS — Z79899 Other long term (current) drug therapy: Secondary | ICD-10-CM

## 2023-02-14 DIAGNOSIS — M25361 Other instability, right knee: Secondary | ICD-10-CM

## 2023-02-14 DIAGNOSIS — M19041 Primary osteoarthritis, right hand: Secondary | ICD-10-CM | POA: Diagnosis not present

## 2023-02-14 DIAGNOSIS — M542 Cervicalgia: Secondary | ICD-10-CM

## 2023-02-14 DIAGNOSIS — M47816 Spondylosis without myelopathy or radiculopathy, lumbar region: Secondary | ICD-10-CM

## 2023-02-14 DIAGNOSIS — E559 Vitamin D deficiency, unspecified: Secondary | ICD-10-CM

## 2023-02-14 DIAGNOSIS — M7061 Trochanteric bursitis, right hip: Secondary | ICD-10-CM | POA: Diagnosis not present

## 2023-02-14 DIAGNOSIS — M19042 Primary osteoarthritis, left hand: Secondary | ICD-10-CM

## 2023-02-14 DIAGNOSIS — Z8639 Personal history of other endocrine, nutritional and metabolic disease: Secondary | ICD-10-CM

## 2023-02-14 DIAGNOSIS — Z8673 Personal history of transient ischemic attack (TIA), and cerebral infarction without residual deficits: Secondary | ICD-10-CM

## 2023-02-14 MED ORDER — DICLOFENAC SODIUM 1 % EX GEL: CUTANEOUS | 2 refills | Status: AC

## 2023-02-14 NOTE — Patient Instructions (Signed)

## 2023-02-24 ENCOUNTER — Other Ambulatory Visit: Payer: Self-pay | Admitting: Physician Assistant

## 2023-02-24 DIAGNOSIS — K219 Gastro-esophageal reflux disease without esophagitis: Secondary | ICD-10-CM

## 2023-03-15 ENCOUNTER — Encounter (INDEPENDENT_AMBULATORY_CARE_PROVIDER_SITE_OTHER): Payer: Medicare HMO | Admitting: Internal Medicine

## 2023-03-26 ENCOUNTER — Encounter (INDEPENDENT_AMBULATORY_CARE_PROVIDER_SITE_OTHER): Payer: Medicare HMO | Admitting: Adult Health

## 2023-03-28 ENCOUNTER — Other Ambulatory Visit: Payer: Self-pay | Admitting: Cardiology

## 2023-03-28 DIAGNOSIS — I429 Cardiomyopathy, unspecified: Secondary | ICD-10-CM

## 2023-03-28 DIAGNOSIS — I5022 Chronic systolic (congestive) heart failure: Secondary | ICD-10-CM

## 2023-04-24 ENCOUNTER — Other Ambulatory Visit: Payer: Self-pay | Admitting: Rheumatology

## 2023-04-24 ENCOUNTER — Other Ambulatory Visit: Payer: Self-pay | Admitting: Physician Assistant

## 2023-04-24 DIAGNOSIS — K5904 Chronic idiopathic constipation: Secondary | ICD-10-CM

## 2023-04-24 DIAGNOSIS — M0579 Rheumatoid arthritis with rheumatoid factor of multiple sites without organ or systems involvement: Secondary | ICD-10-CM

## 2023-04-24 NOTE — Telephone Encounter (Signed)
Last Fill: 01/24/2023  Eye exam: 10/31/2022 WNL   Labs: 01/24/2023 CMP WNL, 12/20/2022 CBC WNL  Next Visit: 07/24/2023  Last Visit: 02/14/2023  [id number] arthritis involving multiple sites with positive rheumatoid factor   Current Dose per office note 02/14/2023: Plaquenil 200 mg 1 tablet by mouth twice daily.   Okay to refill Plaquenil?

## 2023-05-07 ENCOUNTER — Encounter (INDEPENDENT_AMBULATORY_CARE_PROVIDER_SITE_OTHER): Payer: Self-pay | Admitting: Adult Health

## 2023-05-07 ENCOUNTER — Ambulatory Visit (INDEPENDENT_AMBULATORY_CARE_PROVIDER_SITE_OTHER): Payer: Medicare Other | Admitting: Adult Health

## 2023-05-07 VITALS — BP 111/75 | HR 83 | Temp 97.7°F | Ht 67.5 in | Wt 235.0 lb

## 2023-05-07 DIAGNOSIS — E669 Obesity, unspecified: Secondary | ICD-10-CM | POA: Diagnosis not present

## 2023-05-07 DIAGNOSIS — E559 Vitamin D deficiency, unspecified: Secondary | ICD-10-CM

## 2023-05-07 DIAGNOSIS — Z6836 Body mass index (BMI) 36.0-36.9, adult: Secondary | ICD-10-CM | POA: Diagnosis not present

## 2023-05-07 DIAGNOSIS — R7301 Impaired fasting glucose: Secondary | ICD-10-CM | POA: Diagnosis not present

## 2023-05-07 NOTE — Progress Notes (Signed)
Office: [phone]  /  Fax: [phone]   Initial Visit  Lisa Briggs was seen in clinic today to evaluate for obesity. She is interested in losing weight to improve overall health and reduce the risk of weight related complications. She presents today to review program treatment options, initial physical assessment, and evaluation.     She was referred by: PCP She lives with her husband and her adult son, age 61 Her son is unemployed- he has two daughters age 20, 87- they live in [place]  When asked what else they would like to accomplish? She states: Adopt healthier eating patterns, Improve energy levels and physical activity, Improve existing medical conditions, Reduce number of medications, Improve quality of life, and Improve appearance  Weight history: She reports weight gain after her move from [place] to [place], [place] in 2010  When asked how has your weight affected you? She states: Relationships, Contributed to medical problems, Contributed to orthopedic problems or mobility issues, Having fatigue, Having poor endurance, Problems with eating patterns, and Has affected mood   Some associated conditions: Hypertension, Arthritis:Generalized, Hyperlipidemia, and Heart Failure  Contributing factors: Family history of obesity, Moderate to high levels of stress, Reduced physical activity, and Eating patterns  Weight promoting medications identified: Other: Gabapentin, Effexor  Current nutrition plan: None  Current level of physical activity: Other: Stretching  Current or previous pharmacotherapy: None  Response to medication: Never tried medications   Past medical history includes:   Past Medical History:  Diagnosis Date   Allergy    Anemia    iron therapy for years as of 10/12; normal Hgb 08/2013   Anxiety    Arthritis    Cardiomyopathy (HCC)    Chest pain 04/05/2011   cardiac eval, normal treadmill stress test, Dr. 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 Axel Filler, [facility]   GERD (gastroesophageal reflux disease)    History of uterine fibroid    Hyperlipidemia    Hypertension    Paresthesia 09/2014   initially thought to be TIA, neurology consult in 12/2014 with other non TIA considerations.     Polyarthralgia    normal rheumatoid screen 01/2012   TIA (transient ischemic attack) 2015/16     Objective:   BP 111/75   Pulse 83   Temp 97.7 F (36.5 C)   Ht 5' 7.5" (1.715 m)   Wt 235 lb (106.6 kg)   LMP 02/23/2015 (LMP Unknown)   SpO2 98%   BMI 36.26 kg/m  She was weighed on the bioimpedance scale: Body mass index is 36.26 kg/m.  Peak Weight: 260 , Body Fat%:46.7, Visceral Fat Rating:14, Weight trend over the last 12 months: Increasing  General:  Alert, oriented and cooperative. Patient is in no acute distress.  Respiratory: Normal respiratory effort, no problems with respiration noted   Gait: able to ambulate independently  Mental Status: Normal mood and affect. Normal behavior. Normal judgment and thought content.   DIAGNOSTIC DATA REVIEWED:  BMET    Component Value Date/Time   NA 143 01/24/2023 0925   K 4.1 01/24/2023 0925   CL 106 01/24/2023 0925   CO2 27 01/24/2023 0925   GLUCOSE 83 01/24/2023 0925   GLUCOSE 81 12/20/2022 1214   BUN 15 01/24/2023 0925   CREATININE 0.81  01/24/2023 0925   CREATININE 0.76 12/20/2022 1214   CALCIUM 9.4 01/24/2023 0925   GFRNONAA >60 08/28/2021 1727   GFRNONAA 100 05/11/2020 1459   GFRAA 115 05/11/2020 1459   Lab Results  Component Value Date   HGBA1C 5.8 (A) 06/23/2021   HGBA1C 6.0 (H) 09/29/2014   No results found for: "INSULIN" CBC    Component Value Date/Time   WBC 6.4 12/20/2022 1214   RBC 4.71 12/20/2022 1214   HGB 12.8 12/20/2022 1214   HGB 12.3 06/23/2021 1021   HCT 41.6  12/20/2022 1214   HCT 39.1 06/23/2021 1021   PLT 298 12/20/2022 1214   PLT 291 06/23/2021 1021   MCV 88.3 12/20/2022 1214   MCV 85 06/23/2021 1021   MCH 27.2 12/20/2022 1214   MCHC 30.8 (L) 12/20/2022 1214   RDW 13.0 12/20/2022 1214   RDW 12.1 06/23/2021 1021   Iron/TIBC/Ferritin/ %Sat    Component Value Date/Time   IRON 48 04/16/2018 1030   TIBC 255 10/07/2015 1053   FERRITIN 123 01/17/2011 0859   IRONPCTSAT 36 10/07/2015 1053   Lipid Panel     Component Value Date/Time   CHOL 145 01/24/2023 0922   TRIG 58 01/24/2023 0922   HDL 76 01/24/2023 0922   CHOLHDL 1.9 01/24/2023 0922   CHOLHDL 2.1 04/20/2016 1122   VLDL 9 04/20/2016 1122   LDLCALC 57 01/24/2023 0922   LDLDIRECT 55 01/24/2023 0928   Hepatic Function Panel     Component Value Date/Time   PROT 6.7 01/24/2023 0925   ALBUMIN 4.0 01/24/2023 0925   AST 21 01/24/2023 0925   ALT 18 01/24/2023 0925   ALKPHOS 110 01/24/2023 0925   BILITOT <0.2 01/24/2023 0925   BILIDIR <0.1 08/28/2021 1727   IBILI NOT CALCULATED 08/28/2021 1727      Component Value Date/Time   TSH 1.700 11/19/2019 1448     Assessment and Plan:   Vitamin D deficiency  Impaired fasting blood sugar  Obesity (BMI 30-39.9), Starting BMI 36.24  ESTABLISH WITH HWW   Obesity Treatment / Action Plan:  Patient will work on garnering support from family and friends to begin weight loss journey. Will work on eliminating or reducing the presence of highly palatable, calorie dense foods in the home. Will complete provided nutritional and psychosocial assessment questionnaire before the next appointment. Will be scheduled for indirect calorimetry to determine resting energy expenditure in a fasting state.  This will allow [place] to create a reduced calorie, high-protein meal plan to promote loss of fat mass while preserving muscle mass. Counseled on the health benefits of losing 5%-15% of total body weight. Was counseled on nutritional approaches to  weight loss and benefits of reducing processed foods and consuming plant-based foods and high quality protein as part of nutritional weight management. Was counseled on pharmacotherapy and role as an adjunct in weight management.   Obesity Education Performed Today:  She was weighed on the bioimpedance scale and results were discussed and documented in the synopsis.  We discussed obesity as a disease and the importance of a more detailed evaluation of all the factors contributing to the disease.  We discussed the importance of long term lifestyle changes which include nutrition, exercise and behavioral modifications as well as the importance of customizing this to her specific health and social needs.  We discussed the benefits of reaching a healthier weight to alleviate the symptoms of existing conditions and reduce the risks of the biomechanical, metabolic and psychological effects of obesity.  Docia Chuck Rodino appears to be in the action stage of change and states they are ready to start intensive lifestyle modifications and behavioral modifications.  30 minutes was spent today on this visit including the above counseling, pre-visit chart review, and post-visit documentation.  Reviewed by clinician on day of visit: allergies, medications, problem list, medical history, surgical history, family history, social history, and previous encounter notes pertinent to obesity diagnosis.  Scarlette Hogston d. Rayne Loiseau, NP-C

## 2023-07-11 NOTE — Progress Notes (Signed)
 Office Visit Note  [name]             Date of Birth: [birth year]           MRN: [id number]             PCP: Ulysees Gander, MD Referring: Ulysees Gander, MD Visit Date: 07/24/2023 Occupation: @GUAROCC @  Subjective:  Pain in hands and knees.  History of Present Illness: Lisa Briggs is a 63 y.o. female with seropositive rheumatoid arthritis and osteoarthritis.  She returns today after her last visit in November 2024.  She is on hydroxychloroquine  200 mg p.o. twice daily which she has been taking without any interruption.  She has been having pain and discomfort in her left wrist for the last 2 months.  She states she has been rubbing it with the topical agents.  She continues to have discomfort in her knee joints.  She wants to be referred to [facility] for the evaluation of knee replacement.    Activities of Daily Living:  Patient reports morning stiffness for sometimes all day .   Patient Reports nocturnal pain.  Difficulty dressing/grooming: Denies Difficulty climbing stairs: Reports Difficulty getting out of chair: Reports Difficulty using hands for taps, buttons, cutlery, and/or writing: Denies  Review of Systems  Constitutional:  Positive for fatigue.  HENT:  Positive for mouth dryness. Negative for mouth sores.   Eyes:  Negative for dryness.  Respiratory:  Negative for shortness of breath.   Cardiovascular:  Negative for chest pain and palpitations.  Gastrointestinal:  Positive for constipation. Negative for blood in stool and diarrhea.  Endocrine: Negative for increased urination.  Genitourinary:  Negative for involuntary urination.  Musculoskeletal:  Positive for joint pain, gait problem, joint pain, joint swelling, myalgias, morning stiffness and myalgias. Negative for muscle weakness and muscle tenderness.  Skin:  Negative for color change, rash, hair loss and sensitivity to sunlight.  Allergic/Immunologic: Positive for susceptible to infections.   Neurological:  Negative for dizziness and headaches.  Hematological:  Negative for swollen glands.  Psychiatric/Behavioral:  Negative for depressed mood and sleep disturbance. The patient is not nervous/anxious.     PMFS History:  Patient Active Problem List   Diagnosis Date Noted   Unilateral primary osteoarthritis, right knee 05/04/2022   Bilateral ankle pain 09/15/2021   Chronic HFrEF (heart failure with reduced ejection fraction) (HCC) 07/29/2021   Advance directive discussed with patient 01/27/2021   Medicare annual wellness visit, subsequent 01/27/2021   Needs flu shot 01/27/2021   At moderate risk for fall 01/27/2021   Prediabetes 02/19/2020   Menopause 11/25/2019   Cardiomyopathy (HCC) 11/19/2019   Rheumatoid arthritis involving multiple sites with positive rheumatoid factor (HCC) 11/19/2019   Primary osteoarthritis involving multiple joints 11/19/2019   Greater trochanteric bursitis, right 11/19/2019   Decreased activities of daily living (ADL) 11/19/2019   Gait disturbance 11/19/2019   Use of cane as ambulatory aid 11/19/2019   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    Class: Chronic   Spinal stenosis of lumbar region 04/25/2019    Class: Chronic   Status post lumbar laminectomy 04/25/2019   Chronic pain of right knee 09/13/2018   Chronic pain of left knee 07/08/2018   Chronic hip pain 07/08/2018   Edema 07/08/2018   Need for shingles vaccine 12/05/2017   Primary osteoarthritis of both hands 11/23/2017   Primary osteoarthritis of both knees 11/23/2017   Primary osteoarthritis of both feet 11/23/2017   DDD (  degenerative disc disease), lumbar 11/23/2017   Vaccine counseling 08/13/2017   Sensitive skin 01/23/2017   Need for influenza vaccination 01/23/2017   History of gastrointestinal stromal tumor (GIST) 04/20/2016   History of TIA (transient ischemic attack) 04/20/2016   Screening for breast cancer 04/20/2016   Estrogen deficiency 04/20/2016    Impaired fasting blood sugar 04/20/2016   Constipation 04/20/2016   History of fall 04/20/2016   Chronic radicular lumbar pain 01/27/2016   Encounter for health maintenance examination in adult 03/22/2015   Paresthesia 03/22/2015   Cognitive decline 03/22/2015   Screening for cervical cancer 03/22/2015   Vitamin D  deficiency 03/22/2015   History of uterine leiomyoma 03/22/2015   Gastroesophageal reflux disease without esophagitis 03/02/2014   Chronic nausea 03/02/2014   Rhinitis, allergic 03/02/2014   Essential hypertension 03/02/2014   Hyperlipidemia 03/02/2014   Obesity with serious comorbidity 01/16/2012    Past Medical History:  Diagnosis Date   Allergy    Anemia    iron therapy for years as of 10/12; normal Hgb 08/2013   Anxiety    Arthritis    Cardiomyopathy (HCC)    Chest pain 04/05/2011   cardiac eval, normal treadmill stress test, Dr. Janie Meier   CHF (congestive heart failure) (HCC)    Chronic back pain    Constipation    Farsightedness    wears glasses, Eye care center   Gastrointestinal stromal tumor (GIST) (HCC) 06/2014   Dr. Shela Derby, [facility]   GERD (gastroesophageal reflux disease)    History of uterine fibroid    Hyperlipidemia    Hypertension    Paresthesia 09/2014   initially thought to be TIA, neurology consult in 12/2014 with other non TIA considerations.     Polyarthralgia    normal rheumatoid screen 01/2012   TIA (transient ischemic attack) 2015/16    Family History  Problem Relation Age of Onset   Hypertension Mother    Arthritis Mother    GER disease Mother    Glaucoma Mother    Breast cancer Mother 83   Stroke Father    Breast cancer Sister 37   Hypertension Sister    Leukemia Sister    Hypertension Sister    Colon cancer Brother 42   Diabetes Brother    Diabetes Maternal Aunt    Heart disease Maternal Aunt    Heart disease Maternal Uncle    Stroke Maternal Uncle    Heart disease Maternal Grandmother     Heart disease Maternal Grandfather    Healthy Son    Colon polyps Neg Hx    Esophageal cancer Neg Hx    Stomach cancer Neg Hx    Rectal cancer Neg Hx    Past Surgical History:  Procedure Laterality Date   COLONOSCOPY  01/2014   diverticulosis, othwerise normal - Dr. Hoyt Macleod   DILATATION & CURETTAGE/HYSTEROSCOPY WITH MYOSURE N/A 07/14/2021   Procedure: DILATATION & CURETTAGE/HYSTEROSCOPY WITH MYOSURE Reach;  Surgeon: Terri Fester, MD;  Location: [facility];  Service: Gynecology;  Laterality: N/A;   ESOPHAGOGASTRODUODENOSCOPY  2013   Dr. Nickey Barn, gastritis   ESOPHAGOGASTRODUODENOSCOPY  [id number]   EUS N/A 04/16/2014   Procedure: UPPER ENDOSCOPIC ULTRASOUND (EUS) LINEAR;  Surgeon: Janel Medford, MD;  Location: [facility];  Service: Endoscopy;  Laterality: N/A;   gall stone surgery     KNEE ARTHROSCOPY Left    LAPAROSCOPIC GASTRIC RESECTION N/A 06/09/2014   Procedure: LAPAROSCOPIC GASTRIC MASS RESECTION;  Surgeon: Shela Derby, MD;  Location: [facility];  Service: General;  Laterality: N/A;   LIPOMA EXCISION     forehead   LUMBAR LAMINECTOMY N/A 04/25/2019   Procedure: LEFT L2-3 MICRODISCECTOMY, BILATERAL L5-S1 PARTIAL HEMILAMINECTOMY;  Surgeon: Alphonso Jean, MD;  Location: [facility];  Service: Orthopedics;  Laterality: N/A;   UPPER GASTROINTESTINAL ENDOSCOPY     UTERINE FIBROID SURGERY     Social History   Social History Narrative   Married, has 1 son in [place]  and some grandchildren.  Location manager.  Active on job.  Does stretching and exercises daily as per physical therapy.  Works 12 hours daily.     Immunization History  Administered Date(s) Administered   Influenza,inj,Quad PF,6+ Mos 11/24/2013, 01/18/2015, 01/23/2017, 12/05/2017, 12/17/2018, 11/19/2019, 01/27/2021   PFIZER Comirnaty(Gray Top)Covid-19 Tri-Sucrose Vaccine 07/12/2020   PFIZER(Purple Top)SARS-COV-2 Vaccination 06/16/2019, 07/08/2019, 01/15/2020   PNEUMOCOCCAL CONJUGATE-20 07/28/2021    Pneumococcal Polysaccharide-23 05/21/2015   Tdap 01/17/2011, 06/25/2021   Zoster Recombinant(Shingrix ) 10/05/2017, 12/07/2017     Objective: Vital Signs: BP 94/68 (BP Location: Left Arm, Patient Position: Sitting, Cuff Size: Large)   Pulse 80   Ht 5' 7.5" (1.715 m)   Wt 244 lb (110.7 kg)   LMP 02/23/2015 (LMP Unknown)   BMI 37.65 kg/m    Physical Exam Vitals and nursing note reviewed.  Constitutional:      Appearance: She is well-developed.  HENT:     Head: Normocephalic and atraumatic.  Eyes:     Conjunctiva/sclera: Conjunctivae normal.  Cardiovascular:     Rate and Rhythm: Normal rate and regular rhythm.     Heart sounds: Normal heart sounds.  Pulmonary:     Effort: Pulmonary effort is normal.     Breath sounds: Normal breath sounds.  Abdominal:     General: Bowel sounds are normal.     Palpations: Abdomen is soft.  Musculoskeletal:     Cervical back: Normal range of motion.  Lymphadenopathy:     Cervical: No cervical adenopathy.  Skin:    General: Skin is warm and dry.     Capillary Refill: Capillary refill takes less than 2 seconds.  Neurological:     Mental Status: She is alert and oriented to person, place, and time.  Psychiatric:        Behavior: Behavior normal.      Musculoskeletal Exam: She had limited lateral rotation, flexion and extension of the cervical spine with some discomfort.  She had no tenderness over thoracic or lumbar spine.  Shoulder joints and elbow joints in good range of motion.  Wrist joints with good range of motion.  She describes some tenderness over the left wrist flexor tendon region.  No synovitis or tenosynovitis was noted.  There was no synovitis over MCPs PIPs or DIPs.  Bilateral PIP and DIP thickening was noted.  Hip joints and knee joints in good range of motion without any warmth swelling or effusion.  Discomfort range of motion of her knee joints.  There was no tenderness over ankles or MTPs.  CDAI Exam: CDAI Score: -- Patient  Global: --; Provider Global: -- Swollen: --; Tender: -- Joint Exam 07/24/2023   No joint exam has been documented for this visit   There is currently no information documented on the homunculus. Go to the Rheumatology activity and complete the homunculus joint exam.  Investigation: No additional findings.  Imaging: No results found.  Recent Labs: Lab Results  Component Value Date   WBC 6.4 12/20/2022   HGB 12.8 12/20/2022   PLT 298 12/20/2022  NA 143 01/24/2023   K 4.1 01/24/2023   CL 106 01/24/2023   CO2 27 01/24/2023   GLUCOSE 83 01/24/2023   BUN 15 01/24/2023   CREATININE 0.81 01/24/2023   BILITOT <0.2 01/24/2023   ALKPHOS 110 01/24/2023   AST 21 01/24/2023   ALT 18 01/24/2023   PROT 6.7 01/24/2023   ALBUMIN 4.0 01/24/2023   CALCIUM  9.4 01/24/2023   GFRAA 115 05/11/2020    Speciality Comments: PLQ Eye Exam: 10/31/2022 WNL @ [facility] Follow up in 1 year  Procedures:  No procedures performed Allergies: Kiwi extract, Mucinex dm [dm-guaifenesin er], and Peach [prunus persica]   Assessment / Plan:     Visit Diagnoses: Rheumatoid arthritis involving multiple sites with positive rheumatoid factor (HCC) - +RF, +CCP: She reports comfort in her hands and wrist joints.  No synovitis was noted.  She had bilateral PIP and DIP thickening suggestive of osteoarthritis.  She has been taking Plaquenil  200 mg twice a day without any interruption.  High risk medication use - Plaquenil  200 mg 1 tablet by mouth twice daily. PLQ Eye Exam: 10/31/2022. - Plan: CBC with Differential/Platelet, Comprehensive metabolic panel with GFR today.  Information on immunization was placed with [facility].  She was advised to get labs every 5 months.  Pain in both hands -she complains of pain and discomfort in her bilateral hands and wrist.  No synovitis was noted.  I believe the discomfort is coming from underlying osteoarthritis.  X-rays were obtained today for comparison.  Plan: XR Hand 2 View Left, XR  Hand 2 View Right, x-ray showed osteoarthritic changes without any radiographic progression.  X-ray findings were reviewed with the patient.  I will refer her to physical therapy.  Ambulatory referral to Physical Therapy  Primary osteoarthritis of both hands-she continues to have pain and discomfort.  Primary osteoarthritis of both knees -she continues to have pain and discomfort in the bilateral knee joints.  She has severe end-stage osteoarthritis.  I will refer her to Dr.Alusio.  Per patient's request prescription refill for Lidoderm  patches was sent.  Plan: lidocaine  (LIDODERM ) 5 %, Ambulatory referral to Orthopedic Surgery.  We will  also give her prescription for rollator walker due to instability in the gait and chronic pain.  Instability of right knee joint-she ambulates with the help of a cane.  Primary osteoarthritis of both feet-no active synovitis was noted.  Neck pain -she continues to have discomfort in her neck.  X-rays obtained in the past of the cervical spine showed multilevel spondylosis with anterior osteophytes and facet joint arthropathy.  DDD (degenerative disc disease), cervical -I will refer her to physical therapy.  Plan: Ambulatory referral to Physical Therapy  Spondylosis of lumbar spine-she is discomfort in her lower back.  Osteopenia of left hip - DEXA updated on 08/05/21: The BMD measured at Femur Neck Left is 0.868 g/cm2 with a T-score of -1.2.  Regular exercise was emphasized.  Use of calcium  rich diet and vitamin D  was discussed.  Other medical problems are listed as follows:  Vitamin D  deficiency  History of hyperlipidemia  History of TIA (transient ischemic attack)  Essential hypertension  Gastroesophageal reflux disease without esophagitis  Orders: Orders Placed This Encounter  Procedures   XR Hand 2 View Left   XR Hand 2 View Right   CBC with Differential/Platelet   Comprehensive metabolic panel with GFR   Ambulatory referral to Orthopedic  Surgery   Ambulatory referral to Physical Therapy   Meds ordered this encounter  Medications   lidocaine  (LIDODERM ) 5 %    Sig: Place 1 patch onto the skin daily. Remove & Discard patch within 12 hours or as directed by MD    Dispense:  15 patch    Refill:  0     Follow-Up Instructions: Return in about 5 months (around 12/24/2023) for Rheumatoid arthritis, Osteoarthritis.   Nicholas Bari, MD  Note - This record has been created using Animal nutritionist.  Chart creation errors have been sought, but may not always  have been located. Such creation errors do not reflect on  the standard of medical care.

## 2023-07-16 ENCOUNTER — Other Ambulatory Visit: Payer: Self-pay | Admitting: Physician Assistant

## 2023-07-16 DIAGNOSIS — M0579 Rheumatoid arthritis with rheumatoid factor of multiple sites without organ or systems involvement: Secondary | ICD-10-CM

## 2023-07-17 NOTE — Telephone Encounter (Signed)
 Last Fill: 04/24/2023  Eye exam: 10/31/2022 WNL    Labs: 0/23/2024 CMP WNL, 12/20/2022 CBC WNL   Next Visit: 07/24/2023  Last Visit: 02/14/2023  ZD:[id number] arthritis involving multiple sites with positive rheumatoid factor   Current Dose per office note 02/14/2023: Plaquenil 200 mg 1 tablet by mouth twice daily.   Patient to update labs at upcoming appointment on 07/24/2023.  Okay to refill Plaquenil?

## 2023-07-24 ENCOUNTER — Encounter: Payer: Self-pay | Admitting: Rheumatology

## 2023-07-24 ENCOUNTER — Ambulatory Visit: Payer: Medicare HMO | Attending: Rheumatology | Admitting: Rheumatology

## 2023-07-24 ENCOUNTER — Ambulatory Visit (INDEPENDENT_AMBULATORY_CARE_PROVIDER_SITE_OTHER)

## 2023-07-24 ENCOUNTER — Telehealth: Payer: Self-pay | Admitting: Rheumatology

## 2023-07-24 ENCOUNTER — Ambulatory Visit

## 2023-07-24 VITALS — BP 94/68 | HR 80 | Ht 67.5 in | Wt 244.0 lb

## 2023-07-24 DIAGNOSIS — S82892D Other fracture of left lower leg, subsequent encounter for closed fracture with routine healing: Secondary | ICD-10-CM

## 2023-07-24 DIAGNOSIS — M85852 Other specified disorders of bone density and structure, left thigh: Secondary | ICD-10-CM

## 2023-07-24 DIAGNOSIS — K219 Gastro-esophageal reflux disease without esophagitis: Secondary | ICD-10-CM

## 2023-07-24 DIAGNOSIS — M19041 Primary osteoarthritis, right hand: Secondary | ICD-10-CM | POA: Diagnosis not present

## 2023-07-24 DIAGNOSIS — M0579 Rheumatoid arthritis with rheumatoid factor of multiple sites without organ or systems involvement: Secondary | ICD-10-CM | POA: Diagnosis not present

## 2023-07-24 DIAGNOSIS — M170 Bilateral primary osteoarthritis of knee: Secondary | ICD-10-CM

## 2023-07-24 DIAGNOSIS — M19042 Primary osteoarthritis, left hand: Secondary | ICD-10-CM

## 2023-07-24 DIAGNOSIS — M25361 Other instability, right knee: Secondary | ICD-10-CM

## 2023-07-24 DIAGNOSIS — Z79899 Other long term (current) drug therapy: Secondary | ICD-10-CM | POA: Diagnosis not present

## 2023-07-24 DIAGNOSIS — M79642 Pain in left hand: Secondary | ICD-10-CM

## 2023-07-24 DIAGNOSIS — Z8673 Personal history of transient ischemic attack (TIA), and cerebral infarction without residual deficits: Secondary | ICD-10-CM

## 2023-07-24 DIAGNOSIS — M79641 Pain in right hand: Secondary | ICD-10-CM | POA: Diagnosis not present

## 2023-07-24 DIAGNOSIS — M19071 Primary osteoarthritis, right ankle and foot: Secondary | ICD-10-CM

## 2023-07-24 DIAGNOSIS — M19072 Primary osteoarthritis, left ankle and foot: Secondary | ICD-10-CM

## 2023-07-24 DIAGNOSIS — Z8639 Personal history of other endocrine, nutritional and metabolic disease: Secondary | ICD-10-CM

## 2023-07-24 DIAGNOSIS — M5030 Other cervical disc degeneration, unspecified cervical region: Secondary | ICD-10-CM

## 2023-07-24 DIAGNOSIS — E559 Vitamin D deficiency, unspecified: Secondary | ICD-10-CM

## 2023-07-24 DIAGNOSIS — M47816 Spondylosis without myelopathy or radiculopathy, lumbar region: Secondary | ICD-10-CM

## 2023-07-24 DIAGNOSIS — M542 Cervicalgia: Secondary | ICD-10-CM

## 2023-07-24 DIAGNOSIS — M7061 Trochanteric bursitis, right hip: Secondary | ICD-10-CM

## 2023-07-24 DIAGNOSIS — I10 Essential (primary) hypertension: Secondary | ICD-10-CM

## 2023-07-24 MED ORDER — LIDOCAINE 5 % EX PTCH: 1.0000 | MEDICATED_PATCH | CUTANEOUS | 0 refills | Status: AC

## 2023-07-24 NOTE — Telephone Encounter (Signed)
 Yes

## 2023-07-24 NOTE — Patient Instructions (Signed)
Standing Labs We placed an order today for your standing lab work.   Please have your standing labs drawn in September  Please have your labs drawn 2 weeks prior to your appointment so that the provider can discuss your lab results at your appointment, if possible.  Please note that you may see your imaging and lab results in MyChart before we have reviewed them. We will contact you once all results are reviewed. Please allow our office up to 72 hours to thoroughly review all of the results before contacting the office for clarification of your results.  WALK-IN LAB HOURS  Monday through Thursday from 8:00 am -12:30 pm and 1:00 pm-5:00 pm and Friday from 8:00 am-12:00 pm.  Patients with office visits requiring labs will be seen before walk-in labs.  You may encounter longer than normal wait times. Please allow additional time. Wait times may be shorter on  Monday and Thursday afternoons.  We do not book appointments for walk-in labs. We appreciate your patience and understanding with our staff.   Labs are drawn by [facility]. Please bring your co-pay at the time of your lab draw.  You may receive a bill from [facility] for your lab work.  Please note if you are on Hydroxychloroquine and and an order has been placed for a Hydroxychloroquine level,  you will need to have it drawn 4 hours or more after your last dose.  If you wish to have your labs drawn at another location, please call the office 24 hours in advance so we can fax the orders.  The office is located at [address], [address], [place], [place] [address]   If you have any questions regarding directions or hours of operation,  please call [phone].   As a reminder, please drink plenty of water prior to coming for your lab work. Thanks!   Vaccines You are taking a medication(s) that can suppress your immune system.  The following immunizations are recommended: Flu annually Covid-19  Td/Tdap (tetanus, diphtheria, pertussis)  every 10 years Pneumonia (Prevnar 15 then Pneumovax 23 at least 1 year apart.  Alternatively, can take Prevnar 20 without needing additional dose) Shingrix: 2 doses from 4 weeks to 6 months apart  Please check with your PCP to make sure you are up to date.

## 2023-07-24 NOTE — Telephone Encounter (Signed)
 Patient contacted the office to request a medication refill.   1. Name of Medication: Rollator Ultra-Light  2. How are you currently taking this medication (dosage and times per day)? N/A   3. What pharmacy would you like for that to be sent to? [facility]   Patient states she never received the Rollator that Dr. Alvira Josephs prescribed at her last appointment in November, 2024.

## 2023-07-25 ENCOUNTER — Telehealth: Payer: Self-pay | Admitting: Rheumatology

## 2023-07-25 LAB — CBC WITH DIFFERENTIAL/PLATELET
Absolute Lymphocytes: 2038 {cells}/uL (ref 850–3900)
Absolute Monocytes: 459 {cells}/uL (ref 200–950)
Basophils Absolute: 28 {cells}/uL (ref 0–200)
Basophils Relative: 0.5 %
Eosinophils Absolute: 73 {cells}/uL (ref 15–500)
Eosinophils Relative: 1.3 %
HCT: 40.8 % (ref 35.0–45.0)
Hemoglobin: 12.9 g/dL (ref 11.7–15.5)
MCH: 27.8 pg (ref 27.0–33.0)
MCHC: 31.6 g/dL — ABNORMAL LOW (ref 32.0–36.0)
MCV: 87.9 fL (ref 80.0–100.0)
MPV: 10 fL (ref 7.5–12.5)
Monocytes Relative: 8.2 %
Neutro Abs: 3002 {cells}/uL (ref 1500–7800)
Neutrophils Relative %: 53.6 %
Platelets: 318 10*3/uL (ref 140–400)
RBC: 4.64 10*6/uL (ref 3.80–5.10)
RDW: 12.3 % (ref 11.0–15.0)
Total Lymphocyte: 36.4 %
WBC: 5.6 10*3/uL (ref 3.8–10.8)

## 2023-07-25 LAB — COMPREHENSIVE METABOLIC PANEL WITH GFR
AG Ratio: 1.5 (calc) (ref 1.0–2.5)
ALT: 17 U/L (ref 6–29)
AST: 18 U/L (ref 10–35)
Albumin: 4 g/dL (ref 3.6–5.1)
Alkaline phosphatase (APISO): 102 U/L (ref 37–153)
BUN: 14 mg/dL (ref 7–25)
CO2: 31 mmol/L (ref 20–32)
Calcium: 9.2 mg/dL (ref 8.6–10.4)
Chloride: 107 mmol/L (ref 98–110)
Creat: 0.9 mg/dL (ref 0.50–1.05)
Globulin: 2.6 g/dL (ref 1.9–3.7)
Glucose, Bld: 78 mg/dL (ref 65–99)
Potassium: 4.2 mmol/L (ref 3.5–5.3)
Sodium: 143 mmol/L (ref 135–146)
Total Bilirubin: 0.3 mg/dL (ref 0.2–1.2)
Total Protein: 6.6 g/dL (ref 6.1–8.1)
eGFR: 73 mL/min/{1.73_m2} (ref >=60)

## 2023-07-25 NOTE — Telephone Encounter (Signed)
 Pt called stating her insurance would need prior authorization for the rollator prescription. Pt stated it needs to have her medical history in there stating why she needs the rollator and not a cane. The number to fax the information is [phone]

## 2023-07-25 NOTE — Progress Notes (Signed)
 CBC and CMP are normal.

## 2023-07-25 NOTE — Telephone Encounter (Signed)
 Patient advised we can provide her with a prescription for a rollator walker. Patient advised she will have to come by the office to pick up prescription and take to the medical supply store. Prescription ready at front for patient to pick up.

## 2023-07-26 ENCOUNTER — Telehealth: Payer: Self-pay | Admitting: Rheumatology

## 2023-07-26 NOTE — Telephone Encounter (Signed)
 Spoke with patient to advised we would not be the ones who do a prior authorization of the walker. Patient advised we can provide office notes. Patient expressed understanding.

## 2023-07-26 NOTE — Telephone Encounter (Signed)
 error

## 2023-07-26 NOTE — Telephone Encounter (Signed)
The note has been addended

## 2023-07-27 ENCOUNTER — Telehealth: Payer: Self-pay | Admitting: Rheumatology

## 2023-07-27 NOTE — Telephone Encounter (Signed)
 [place] with [facility] has stated they received the face notes but is also requesting a prescription to be sent over with what equipment that pt will be needing. Call back number if for any questions is [phone]

## 2023-07-27 NOTE — Telephone Encounter (Signed)
 Faxed [facility] note to [facility].

## 2023-07-27 NOTE — Telephone Encounter (Signed)
 Reached out to [place] to advise she will need to reach out to the patient as we wrote the prescription and provided it to her. [place] states she will reach out to the patient.

## 2023-08-02 ENCOUNTER — Other Ambulatory Visit: Payer: Self-pay | Admitting: Physician Assistant

## 2023-08-02 DIAGNOSIS — K219 Gastro-esophageal reflux disease without esophagitis: Secondary | ICD-10-CM

## 2023-08-13 ENCOUNTER — Telehealth: Payer: Self-pay | Admitting: *Deleted

## 2023-08-13 NOTE — Telephone Encounter (Signed)
 Submitted a Prior Authorization request to [facility] for Lidoderm Patches via CoverMyMeds. Will update once we receive a response.

## 2023-08-16 NOTE — Telephone Encounter (Signed)
 Received notification from [facility] regarding a prior authorization for Lidocaine  5% patches. Authorization has been APPROVED from 08/13/23 to 04/02/24. Approval letter sent to scan center.  Authorization #  [id number]  Geraldene Kleine, PharmD, MPH, BCPS, CPP Clinical Pharmacist (Rheumatology and Pulmonology)

## 2023-10-25 ENCOUNTER — Other Ambulatory Visit: Payer: Self-pay | Admitting: Rheumatology

## 2023-10-25 DIAGNOSIS — M0579 Rheumatoid arthritis with rheumatoid factor of multiple sites without organ or systems involvement: Secondary | ICD-10-CM

## 2023-10-25 NOTE — Telephone Encounter (Signed)
 Last Fill: 07/17/2023  Eye exam: 10/31/2022   Labs: 07/24/2023  CBC and CMP are normal.   Next Visit: 01/02/2024  Last Visit: 07/24/2023  IK:Myzlfjunpi arthritis involving multiple sites with positive rheumatoid factor   Current Dose per office note 07/24/2023: Plaquenil  200 mg 1 tablet by mouth twice daily.   Okay to refill Plaquenil ?

## 2023-11-28 ENCOUNTER — Ambulatory Visit: Attending: Family Medicine

## 2023-11-28 DIAGNOSIS — M6281 Muscle weakness (generalized): Secondary | ICD-10-CM | POA: Diagnosis present

## 2023-11-28 DIAGNOSIS — M25561 Pain in right knee: Secondary | ICD-10-CM | POA: Diagnosis present

## 2023-11-28 DIAGNOSIS — G8929 Other chronic pain: Secondary | ICD-10-CM | POA: Insufficient documentation

## 2023-11-28 DIAGNOSIS — M5459 Other low back pain: Secondary | ICD-10-CM | POA: Diagnosis present

## 2023-11-28 DIAGNOSIS — R2689 Other abnormalities of gait and mobility: Secondary | ICD-10-CM | POA: Insufficient documentation

## 2023-11-28 NOTE — Therapy (Signed)
 OUTPATIENT PHYSICAL THERAPY WHEELCHAIR EVALUATION   Patient Name: Lisa Briggs MRN: [id number] DOB:01/16/1961, 63 y.o., female Today's Date: 11/28/2023  END OF SESSION:  PT End of Session - 11/28/23 1425     Visit Number 1    Number of Visits 1    PT Start Time 1315    PT Stop Time 1400    PT Time Calculation (min) 45 min    Equipment Utilized During Treatment Gait belt    Activity Tolerance Patient tolerated treatment well    Behavior During Therapy WFL for tasks assessed/performed          Past Medical History:  Diagnosis Date   Allergy    Anemia    iron therapy for years as of 10/12; normal Hgb 08/2013   Anxiety    Arthritis    Cardiomyopathy (HCC)    Chest pain 04/05/2011   cardiac eval, normal treadmill stress test, Dr. 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 Lynda Leos, [facility]   GERD (gastroesophageal reflux disease)    History of uterine fibroid    Hyperlipidemia    Hypertension    Paresthesia 09/2014   initially thought to be TIA, neurology consult in 12/2014 with other non TIA considerations.     Polyarthralgia    normal rheumatoid screen 01/2012   TIA (transient ischemic attack) 2015/16   Past Surgical History:  Procedure Laterality Date   COLONOSCOPY  01/2014   diverticulosis, othwerise normal - Dr. Toribio Cedar   DILATATION & CURETTAGE/HYSTEROSCOPY WITH MYOSURE N/A 07/14/2021   Procedure: DILATATION & CURETTAGE/HYSTEROSCOPY WITH MYOSURE Reach;  Surgeon: Barbette Knock, MD;  Location: [facility];  Service: Gynecology;  Laterality: N/A;   ESOPHAGOGASTRODUODENOSCOPY  2013   Dr. Rollin, gastritis   ESOPHAGOGASTRODUODENOSCOPY  [id number]   EUS N/A 04/16/2014   Procedure: UPPER ENDOSCOPIC ULTRASOUND (EUS) LINEAR;  Surgeon: Toribio SHAUNNA Cedar, MD;  Location: [facility]  ENDOSCOPY;  Service: Endoscopy;  Laterality: N/A;   gall stone surgery     KNEE ARTHROSCOPY Left    LAPAROSCOPIC GASTRIC RESECTION N/A 06/09/2014   Procedure: LAPAROSCOPIC GASTRIC MASS RESECTION;  Surgeon: Lynda Leos, MD;  Location: [facility];  Service: General;  Laterality: N/A;   LIPOMA EXCISION     forehead   LUMBAR LAMINECTOMY N/A 04/25/2019   Procedure: LEFT L2-3 MICRODISCECTOMY, BILATERAL L5-S1 PARTIAL HEMILAMINECTOMY;  Surgeon: Lucilla Lynwood BRAVO, MD;  Location: [facility] OR;  Service: Orthopedics;  Laterality: N/A;   UPPER GASTROINTESTINAL ENDOSCOPY     UTERINE FIBROID SURGERY     Patient Active Problem List   Diagnosis Date Noted   Unilateral primary osteoarthritis, right knee 05/04/2022   Bilateral ankle pain 09/15/2021   Chronic HFrEF (heart failure with reduced ejection fraction) (HCC) 07/29/2021   Advance directive discussed with patient 01/27/2021   Medicare annual wellness visit, subsequent 01/27/2021   Needs flu shot 01/27/2021   At moderate risk for fall 01/27/2021   Prediabetes 02/19/2020   Menopause 11/25/2019   Cardiomyopathy (HCC) 11/19/2019   Rheumatoid arthritis involving multiple sites with positive rheumatoid factor (HCC) 11/19/2019   Primary osteoarthritis involving multiple joints 11/19/2019   Greater trochanteric bursitis, right 11/19/2019   Decreased activities of daily living (ADL) 11/19/2019   Gait disturbance 11/19/2019   Use of cane as ambulatory aid 11/19/2019  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    Class: Chronic   Spinal stenosis of lumbar region 04/25/2019    Class: Chronic   Status post lumbar laminectomy 04/25/2019   Chronic pain of right knee 09/13/2018   Chronic pain of left knee 07/08/2018   Chronic hip pain 07/08/2018   Edema 07/08/2018   Need for shingles vaccine 12/05/2017   Primary osteoarthritis of both hands 11/23/2017   Primary osteoarthritis of both knees 11/23/2017   Primary osteoarthritis of both feet 11/23/2017    DDD (degenerative disc disease), lumbar 11/23/2017   Vaccine counseling 08/13/2017   Sensitive skin 01/23/2017   Need for influenza vaccination 01/23/2017   History of gastrointestinal stromal tumor (GIST) 04/20/2016   History of TIA (transient ischemic attack) 04/20/2016   Screening for breast cancer 04/20/2016   Estrogen deficiency 04/20/2016   Impaired fasting blood sugar 04/20/2016   Constipation 04/20/2016   History of fall 04/20/2016   Chronic radicular lumbar pain 01/27/2016   Encounter for health maintenance examination in adult 03/22/2015   Paresthesia 03/22/2015   Cognitive decline 03/22/2015   Screening for cervical cancer 03/22/2015   Vitamin D  deficiency 03/22/2015   History of uterine leiomyoma 03/22/2015   Gastroesophageal reflux disease without esophagitis 03/02/2014   Chronic nausea 03/02/2014   Rhinitis, allergic 03/02/2014   Essential hypertension 03/02/2014   Hyperlipidemia 03/02/2014   Obesity with serious comorbidity 01/16/2012    PCP: Maree Leni Edyth DELENA, MD   REFERRING PROVIDER: Maree Leni Edyth DELENA, MD   THERAPY DIAG:  Chronic pain of right knee  Muscle weakness (generalized)  Other abnormalities of gait and mobility  Other low back pain  Rationale for Evaluation and Treatment Rehabilitation  SUBJECTIVE:                                                                                                                                                                                           SUBJECTIVE STATEMENT: Pt presents for wheelchair evaluation. Patient has PMH consisting of OA and RA. Patient has DJD of cervical spine, lumbar spine, bil knees, bil hands and feet. Patient also has osteopenia and recently shown decreased bone density in L hip (per MD note 07/24/23). Pt reports her pain varies depending on the day and some days it is 10/10 and can't get out bed for 1-3 days.   PRECAUTIONS: Fall  RED FLAGS: None  WEIGHT BEARING RESTRICTIONS  No  PLOF:  Requires assistive device for independence, Needs assistance with ADLs, Needs assistance with homemaking, and Needs assistance with gait  PATIENT GOALS: Get a scooter         MEDICAL HISTORY:  Primary diagnosis onset: 11/13/23 - date of referral     Medical Diagnosis with ICD-10 code: E66.01 (ICD-10-CM) - Morbid (severe) obesity due to excess calories  M05.79 (ICD-10-CM) - Rheumatoid arthritis with rheumatoid factor of multiple sites without organ or systems involvement  M51.369 (ICD-10-CM) - Other intervertebral disc degeneration, lumbar region without mention of lumbar back pain or lower extremity pain     [] Progressive disease  Relevant future surgeries:     Height: 5' 1 Weight: 248 lbs Explain recent changes or trends in weight:      History:  Past Medical History:  Diagnosis Date   Allergy    Anemia    iron therapy for years as of 10/12; normal Hgb 08/2013   Anxiety    Arthritis    Cardiomyopathy (HCC)    Chest pain 04/05/2011   cardiac eval, normal treadmill stress test, Dr. 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 Lynda Leos, [facility]   GERD (gastroesophageal reflux disease)    History of uterine fibroid    Hyperlipidemia    Hypertension    Paresthesia 09/2014   initially thought to be TIA, neurology consult in 12/2014 with other non TIA considerations.     Polyarthralgia    normal rheumatoid screen 01/2012   TIA (transient ischemic attack) 2015/16       Cardio Status:  Functional Limitations:   [x] Intact  []  Impaired      Respiratory Status:  Functional Limitations:   [x] Intact  [] Impaired   [] SOB [] COPD [] O2 Dependent ______LPM  [] Ventilator Dependent  Resp equip:                                                     Objective Measure(s):   Orthotics:    [] Amputee:                                                             [] Prosthesis:        HOME ENVIRONMENT:  [x] House [] Condo/town home [] Apartment [] Asst living [] LTCF         [x] Own  [] Rent   [] Lives alone [x] Lives with others -        husband kids                     Hours without assistance: 2-4  [x] Home is accessible to patient                                 Storage of wheelchair:  [x] In home   [] Other Comments:        COMMUNITY :  TRANSPORTATION:  [x] Car [] Stage manager [] Adapted w/c Lift []  Ambulance [] Other:                     [] Sits in wheelchair during transport   Where is w/c stored during transport?  [] Tie  Downs  []  [employer]  r   [] Self-Driver       Drive while in  Biomedical scientist [] yes [x] no   Employment and/or school:  Specific requirements pertaining to mobility        Other:  COMMUNICATION:  Verbal Communication  [x] WFL [] receptive [] WFL [] expressive [] Understandable  [] Difficult to understand  [] non-communicative  Primary Language:_______English_______ 2nd:_____________  Communication provided by:[x] Patient [] Family [] Caregiver [] Translator   [] Uses an augmentative communication device     Manufacturer/Model :                                                                MOBILITY/BALANCE:  Sitting Balance  Standing Balance  Transfers  Ambulation   [x] WFL      [] WFL  [] Independent  []  Independent   [] Uses UE for balance in sitting Comments:  [x] Uses UE/device for stability Comments: uses cane and rolator [x]  Min assist - uses cane and rolator []  Ambulates independently with       device:___________________      []  Mod assist  [x]  Able to ambulate __30-50____ feet        safely/functionally/independently   []  Min assist  []  Min assist  []  Max assist  []  Non-functional ambulator         History/High risk of falls   []  Mod assist  []  Mod assist  []  Dependent  []  Unable to ambulate   []  Max  assist  []  Max assist  Transfer method:[] 1 person [] 2  person [] sliding board [] squat pivot [] stand pivot [] mechanical patient lift  [] other:   []  Unable  []  Unable    Fall History: # of falls in the past 6 months? 0 # of "near" falls in the past 6 months? 2-3 per week    CURRENT SEATING / MOBILITY:  Current Mobility Device: [] None [x] Cane/Walker [] Manual [] Dependent [] Dependent w/ Tilt rScooter  [] Power (type of control):   Manufacturer:  Model:  Serial #:   Size:  Color:  Age:   Purchased by whom:   Current condition of mobility base:    Current seating system:                                                                       Age of seating system:    Describe posture in present seating system:    Is the current mobility meeting medical necessity?:  [] Yes [x] No Describe: Due to shoulder pain, R>L knee pain, and lower back pain, pt has difficulty with standing, walking which causes it difficulty for her to get around her home. She reports of multiple near falls per week despite using current device.                                    Ability to complete Mobility-Related Activities of Daily Living (MRADL's) with Current Mobility Device:   Move room to room  [] Independent  [x] Min [] Mod [] Max assist  [] Unable  Comments: uses cane/RW  to ambulate in the house,   Meal prep  [] Independent  [] Min [x] Mod [] Max assist  [] Unable    Feeding  [x] Independent  [] Min [] Mod [] Max assist  [] Unable    Bathing  [x] Independent  [] Min [] Mod [] Max assist  [] Unable    Grooming  [x] Independent  [] Min [] Mod [] Max assist  [] Unable    UE dressing  [x] Independent  [] Min [] Mod [] Max assist  [] Unable    LE dressing  [x] Independent   [] Min [] Mod [] Max assist  [] Unable    Toileting  [x] Independent  [] Min [] Mod [] Max assist  [] Unable    Bowel Mgt: [x]  Continent []  Incontinent []  Accidents []  Diapers []  Colostomy []  Bowel Program:  Bladder Mgt: [x]  Continent []  Incontinent []  Accidents []  Diapers []  Urinal []  Intermittent Cath []  Indwelling Cath []  Supra-pubic Cath      Current Mobility Equipment Trialed/ Ruled Out:    Does not meet mobility needs due to:    Mark all boxes that indicate inability to use the specific equipment listed     Meets needs for safe  independent functional  ambulation  / mobility    Risk of  Falling or History of Falls    Enviromental limitations      Cognition    Safety concerns with  physical ability    Decreased / limitations endurance  & strength     Decreased / limitations  motor skills  & coordination    Pain    Pace /  Speed    Cardiac and/or  respiratory condition    Contra - indicated by diagnosis   Cane/Crutches  []   [x]   []   []   []   [x]   []   [x]   [x]   []   []    Walker / Rollator  []  NA   []   [x]   []   []   []   [x]   []   [x]   [x]   []   []     Manual Wheelchair K0001-K0007:  [x]  NA  []   []   []   []   []   []   []   []   []   []   []    Manual W/C (K0005) with power assist  [x]  NA  []   []   []   []   []   []   []   []   []   []   []    Scooter  []  NA  [x]   []   []   []   []   []   []   []   []   []   []    Power Wheelchair: standard joystick  []  NA  []   []   []   []   []   []   []   []   []   []   []    Power Wheelchair: alternative controls  []  NA  []   []   []   []   []   []   []   []   []   []   []    Summary:  The least costly alternative for independent functional mobility was found to be:    []  Crutch/Cane  []  Walker []  Manual w/c  []  Manual w/c with power assist   [x]  Scooter   []  Power w/c std joystick   []  Power w/c alternative control        []  Requires dependent care mobility device   Cabin crew for [employer] skills are adequate for safe mobility equipment operation  [x]   Yes []   No  Patient is willing and motivated to use recommended mobility equipment  [x]   Yes []   No       []  Patient is unable  to safely operate mobility equipment independently and requires dependent care equipment Comments:           SENSATION and SKIN ISSUES:  Sensation [x]  Intact  []  Impaired []  Absent []  Hyposensate []  Hypersensate   []  Defensiveness  Location(s) of impairment:    Pressure Relief Method(s):  [x]  Lean side to side to offload (without risk of falling)  []   W/C push up (4+ times/hour for 15+ seconds) [x]  Stand up (without risk of falling)    []  Other: (Describe): Effective pressure relief method(s) above can be performed consistently throughout the day: [] Yes  []  No If not, Why?:  Skin Integrity Risk:       [x]  Low risk           []  Moderate risk            []  High risk  If high risk, explain:   Skin Issues/Skin Integrity  Current skin Issues  []  Yes [x]  No [x]  Intact  []   Red area   []   Open area  []  Scar tissue  []  At risk from prolonged sitting  Where: History of Skin Issues  []  Yes [x]  No Where : When: Stage: Hx of skin flap surgeries  []  Yes [x]  No Where:  When:  Pain: [x]  Yes []  No   Pain Location(s): Low back pain, bil shoulder pain (R>L), R leg pain Intensity scale: (0-10) :8-9/10 How does pain interfere with mobility and/or MRADLs? - difficulty with prolonged use of cane and RW due to shoulder pain, difficulty with prolonged standing and walking        MAT EVALUATION:  Neuro-Muscular Status: (Tone, Reflexive, Responses, etc.)     [x]   Intact   []  Spasticity:  []  Hypotonicity  []  Fluctuating  []  Muscle Spasms  []  Poor Righting Reactions/Poor Equilibrium Reactions  []  Primal Reflex(s):    Comments:            COMMENTS:    POSTURE:     Comments:  Pelvis Anterior/Posterior:  [x]  Neutral   []  Posterior  []  Anterior  []  Fixed - No movement []  Tendency away from neutral [x]  Flexible []  Self-correction []  External correction Obliquity (viewed from front)  [x]  WFL []  R Obliquity []  L Obliquity  []  Fixed - No movement []  Tendency away from neutral [x]  Flexible []  Self-correction []  External correction Rotation  [x]  WFL []  R anterior []  L anterior  []  Fixed - No movement []  Tendency away from neutral [x]  Flexible []  Self-correction []  External  correction Tonal Influence Pelvis:  [x]  Normal []  Flaccid []  Low tone []  Spasticity []  Dystonia []  Pelvis thrust []  Other:    Trunk Anterior/Posterior:  [x]  WFL []  Thoracic kyphosis []  Lumbar lordosis  []  Fixed - No movement []  Tendency away from neutral []  Flexible []  Self-correction []  External correction  [x]  WFL []  Convex to left  []  Convex to right []  S-curve   []  C-curve []  Multiple curves []  Tendency away from neutral []  Flexible []  Self-correction []  External correction Rotation of shoulders and upper trunk:  [x]  Neutral []  Left-anterior []  Right- anterior []  Fixed- no movement []  Tendency away from neutral []  Flexible []  Self correction []  External correction Tonal influence Trunk:  [x]  Normal []  Flaccid []  Low tone []  Spasticity []  Dystonia []  Other:   Head & Neck  [x]  Functional []  Flexed    []  Extended []  Rotated right  []  Rotated left []  Laterally flexed right []  Laterally flexed left []  Cervical hyperextension   [  x] Good head control []  Adequate head control []  Limited head control []  Absent head control Describe tone/movement of head and neck:      Lower Extremity Measurements: LE ROM:  Active ROM Right 11/28/2023 Left 11/28/2023  Hip flexion    Hip extension    Hip abduction    Hip adduction    Knee flexion    Knee extension    Ankle dorsiflexion    Ankle plantarflexion     (Blank rows = not tested)  LE MMT:  MMT Right 11/28/2023 Left 11/28/2023  Hip flexion 3+ 3+  Hip extension    Hip abduction    Hip adduction    Knee flexion 3+ 4  Knee extension 3+ 4  Ankle dorsiflexion    Ankle plantarflexion     (Blank rows = not tested)  Hip positions:  []  Neutral   [x]  Abducted   []  Adducted  []  Subluxed   []  Dislocated   []  Fixed   []  Tendency away from neutral [x]  Flexible []  Self-correction []  External correction   Hip Windswept:[x]  Neutral  []  Right    []  Left  []  Subluxed   []  Dislocated   []  Fixed   []   Tendency away from neutral []  Flexible []  Self-correction []  External correction  LE Tone: [x]  Normal []  Low tone []  Spasticity []  Flaccid []  Dystonia []  Rocks/Extends at hip []  Thrust into knee extension []  Pushes legs downward into footrest  Foot positioning: ROM Concerns: Dorsiflexed: []  Right   []  Left Plantar flexed: []  Right    []  Left Inversion: []  Right    []  Left Eversion: []  Right    []  Left  LE Edema: []  1+ (Barely detectable impression when finger is pressed into skin) [x]  2+ (slight indentation. 15 seconds to rebound) []  3+ (deeper indentation. 30 seconds to rebound) []  4+ (>30 seconds to rebound)  UE Measurements:  UPPER EXTREMITY ROM:   Active ROM Right 11/28/2023 Left 11/28/2023  Shoulder flexion    Shoulder abduction    Shoulder adduction    Elbow flexion    Elbow extension    Wrist flexion    Wrist extension    (Blank rows = not tested)  UPPER EXTREMITY MMT:  MMT Right 11/28/2023 Left 11/28/2023  Shoulder flexion    Shoulder abduction    Shoulder adduction    Elbow flexion    Elbow extension    Wrist flexion    Wrist extension    Pinch strength    Grip strength 24 lbs  18 lbs  (Blank rows = not tested)  Shoulder Posture:  Right Tendency towards Left  [x]   Functional [x]    []   Elevation []    []   Depression []    []   Protraction []    []   Retraction []    []   Internal rotation []    []   External rotation []    []   Subluxed []     UE Tone: [x]  Normal []  Flaccid []  Low tone []  Spasticity  []  Dystonia []  Other:   UE Edema: [x]  1+ (Barely detectable impression when finger is pressed into skin) []  2+ (slight indentation. 15 seconds to rebound) []  3+ (deeper indentation. 30 seconds to rebound) []  4+ (>30 seconds to rebound)  Wrist/Hand: Handedness: [x]  Right   []  Left   []  NA: Comments:  Right  Left  []   WNL []    []   Limitations []    []   Contractures []    []   Fisting []    []   Tremors []    [x]   Weak grasp [x]    []   Poor  dexterity []    []   Hand movement non functional []    []   Paralysis []         MOBILITY BASE RECOMMENDATIONS and JUSTIFICATION:  MOBILITY BASE  JUSTIFICATION   Manufacturer:   Pride Model:         Go Go traveler elite 4 wheels                     Color:  Seat Width:  21 Seat Depth    []  Manual mobility base (continue below)   [x]  Scooter/POV  []  Power mobility base   Number of hours per day spent in above selected mobility base: 4 hours  Typical daily mobility base use Schedule: intermittently throughout the day at home to use with mobility in home and with cooking, performing ADLs, to check mail []  is not a safe, functional ambulator  [x]  limitation prevents from completing a MRADL(s) within a reasonable time frame    [x]  limitation places at high risk of morbidity or mortality secondary to  the attempts to perform a    MRADL(s)  []  limitation prevents accomplishing a MRADL(s) entirely  [x]  provide independent mobility  [x]  equipment is a lifetime medical need  [x]  walker or cane inadequate  [x]  any type manual wheelchair      inadequate  []  scooter/POV inadequate      []  requires dependent mobility          MANUAL MOBILITY      []  Standard manual wheelchair  K0001      Arm:    []  both []  right  []  left      Foot:   []  both []  right   []  left  []  self-propels wheelchair  []  will use on regular basis  []  chair fits throughout home  []  willing and motivated to use  []  propels with assistance     []  dependent use   []  Standard hemi-manual wheelchair  K0002      Arm:    []  both []  right  []  left      Foot:   []  both []  right   []  left  []  lower seat height required to foot propel  []  short stature  []  self-propels wheelchair  []  will use on regular basis  []  chair fits throughout home  []  willing and motivated to use   []  propels with assistance  []  dependent use   []  Lightweight manual wheelchair  K0003      Arm:    []  both []  right  []  left      Foot:   []   both  []  right  []  left                   []  hemi height required  []  medical condition and weight of  wheelchair affect ability to self      propel standard manual wheelchair in the residence  []  can and does self-propel (marginal propulsion skills)  []  daily use _________hours  []  chair fits throughout home  []  willing and motivated to use  []  lower seat height required to foot propel  []  short stature   []  High strength lightweight manual  wheelchair (Breezy Ultra 4)  K0004     Arm:    []  both []  right  []  left     Foot:   []  both []  right   []   left                                                                  []  hemi height required []  medical condition and weight of wheelchair affect ability to self propel while engaging in frequent MRADL(s) that cannot be performed in a standard or lightweight manual wheelchair  []  daily use _________hours  []  chair fits throughout home  []  willing and motivated to use  []  prevent repetitive use injuries   []  lower seat height required to foot propel  []  short stature    []  Ultra-lightweight manual wheelchair  K0005     Arm:    []  both []  right  []  left     Foot:   []  both []  right  []  left       []  hemi height required  []  heavy duty    Front seat to floor _____ inches      Rear seat to floor _____ inches      Back height _____ inches     Back angle ______ degrees      Front angle _____ degrees  []   full-time manual wheelchair user  []  Requires individualized fitting and optimal adjustments for multiple features that include adjustable axle configuration, fully adjustable center of gravity, wheel camber, seat and back angle, angle of seat slope, which cannot be accommodated by a K0001 through K0004 manual wheelchair  []  prevent repetitive use injuries  []  daily use_________hours   []  user has high activity patterns that frequently require  them  to go out into the community for the purpose of independently accomplishing high level  MRADL activities. Examples of these might include a combination of; shopping, work, school, Photographer, childcare, independently loading and unloading from a vehicle etc.  []  lower seat height required to foot propel  []  short stature  []  heavy duty -  weight over 250lbs   []  Current chair is a K0005   manufacture:___________________  model:_________________  serial#____________________  age:_________    []  First time [id number] user (complete trial)  K0004 time and # of strokes to propel 30 feet: ________seconds _________strokes  [id number] time and # of strokes to propel 30 feet: ________seconds _________strokes  What was the result of the trial between the K0004 and K0005 manual wheelchair? ___    What features of the K0005 w/c are needed as compared to the K0004 base? Why?___    []  adjustable seat and back angle changes the angle of seat slope of the frame to attain a gravity assisted position for efficient propulsion and proper weight distribution along the frame     []  the front of the wheelchair will be configured higher than the back of the chair to allow gravity to assist the user with postural stability  []  the center of the wheel will be positioned for stability, safety and efficient propulsion  []  adjustable axle allows for vertical, horizontal, camber and overall width changes  throughout the wheels for adjustment of the client's exact needs and abilities.   []  adjustable axle increases the stability and function of the chair allowing for adjustment of the center of gravity.   []  accommodates the client's anatomical position in the chair maximizing independence in mobility and maneuverability in all  environments.   []  create a minimal fixed tilt-in space to assist in positioning.   []  Describe users full-time manual wheelchair activity patterns:___    []  Power assist Comments:  []  prevent repetitive use injuries  []  repetitive strain injury present in    shoulder girdle    []   shoulder pain is (> or =) to 7/10     during manual propulsion       Current Pain _____/10  []  requires conservation of energy to participate in MRADL(s) runable to propel up ramps or curbs using manual wheelchair  []  been K0005 user greater than one year  []  user unwilling to use power      wheelchair (reason): []  less expensive option to power   wheelchair   []  rim activated power assist -      decreased strength   []  Heavy duty manual wheelchair       K0006     Arm:    []  both []  right  []  left     Foot:   []  both []  right  []  left     []  hemi height required    []  Dependent base  []  user exceeds 250lbs  []  non-functional ambulator    []  extreme spasticity  []  over active movement   []  broken frame/hx of repeated     repairs  []  able to self-propel in residence       []  lower seat to floor height required  []  unable to self-propel in residence   []  Extra heavy duty manual wheelchair  K0007     Arm:    []  both []  right  []  left     Foot:   []  both []  right  []  left     []  hemi height required  []  Dependent base  []  user exceeds 300lbs  []  non-functional ambulator    []  able to self-propel in residence   []  lower seat to floor height required  []  unable to self-propel in residence     []  Manual wheelchair with tilt [phone]      (Manual "Tilt-n-Space")  []  patient is dependent for transfers  []  patient requires frequent       positioning for pressure relief   []  patient requires frequent      positioning for poor/absent trunk control        []  Stroller Base  []  infant/child   []  unable to propel manual      wheelchair  []  allows for growth  []  non-functional ambulator  []  non-functional UE  []  independent mobility is not a goal at this time    MANUAL FRAME OPTIONS      Push handles  []  extended   []  angle adjustable   []  standard  []  caregiver access  []  caregiver assist    []  allows "hooking" to enable      increased ability to perform ADLs or maintain balance   []   Angle Adjustable Back  []  postural control  []  control of tone/spasticity  []  accommodation of range of motion  []  UE functional control  []  accommodation for seating system    Rear wheel placement  []  std/fixed  [] fully adjustableramputee   []  camber ________degree  []  removable rear wheel  []  non-removable rear wheel  Wheel size _______  Wheel style_______________________  []  improved UE access to wheels  []  increase propulsion ability  []  improved stability  []  changing angle in space for  improvement of postural stability  []  remove for transport    []  allow for seating system to fit on  base  []  amputee placement  []  1-arm drive access   r R  r L  []  enable propulsion of manual       wheelchair with one arm    []  amputee placement   Wheel rims/ Hand rims  []  Standard    []  Specialized-____ []  provide ability to propel manual   []  increase self-propulsion with hand wheelchair weakness/decreased grasp     []  Spoke protector/guard   []  prevent hands from getting caught in spokes   Tires:  []  pneumatic  []  flat free inserts  []  solid  Style:  []  decrease roll resistance              []  prevent frequent flats  []  increase shock absorbency  []  decrease maintenance   []  decrease pain from road shock    []  decrease spasms from road shock    Wheel Locks:    []  push []  pull []  scissor  []  lock wheels for transfers  []  lock wheels from rolling   Brake/wheel lock extension:  []  R  []  L  []  allow user to operate wheel locks due to decreased reach or strength   Caster housing:  Materials engineer size:                      Style:                                          []  suspension fork  []  maneuverability   []  stability of wheelchair   []  durability  []  maintenance  []  angle adjustment for posture  []  allow for feet to come under        wheelchair base  []  allows change in seat to floor  height   []  increase shock absorbency  []  decrease pain from road shock  []  decrease  spasms from road    shock   []  Side guards  []  prevent clothing getting caught in wheel or becoming soiled   [] provide hip and pelvic stability  []  eliminates contact between body and wheels  []  limit hand contact with wheels   []  Anti-tippers      []  prevent wheelchair from tipping    backward  []  assist caregiver with curbs     POWER MOBILITY      [x]  Scooter/POV    [x]  can safely operate   [x]  can safely transfer   [x]  has adequate trunk stability   [x]  cannot functionally propel  manual wheelchair    []  Power mobility base    []  non-ambulatory   []  cannot functionally propel manual wheelchair   []  cannot functionally and safely      operate scooter/POV  []  can safely operate power       wheelchair  []  home is accessible  []  willing to use power wheelchair     Tilt  []  Powered tilt on powered chair  []  Powered tilt on manual chair  []  Manual tilt on manual chair Comments:  []  change position for pressure      []  elief/cannot weight shift   []  change position against      gravitational force on head and      shoulders   []  decrease pain  []   blood pressure management   []  control autonomic dysreflexia  []  decrease respiratory distress  []  management of spasticity  []  management of low tone  []  facilitate postural control   []  rest periods   []  control edema  []  increase sitting tolerance   []  aid with transfers     Recline   []  Power recline on power chair  []  Manual recline on manual chair  Comments:    []  intermittent catheterization  []  manage spasticity  []  accommodate femur to back angle  []  change position for pressure relief/cannot weight shift rhigh risk of pressure sore development  []  tilt alone does not accomplish     effective pressure relief, maximum pressure relief achieved at -      _______ degrees tilt   _______ degrees recline   []  difficult to transfer to and from bed []  rest periods and sleeping in chair  []  repositioning for transfers  []  bring  to full recline for ADL care  []  clothing/diaper changes in chair  []  gravity PEG tube feeding  []  head positioning  []  decrease pain  []  blood pressure management   []  control autonomic dysreflexia  []  decrease respiratory distress  []  user on ventilator     Elevator on mobility base  []  Power wheelchair  []  Scooter  []  increase Indep in transfers   []  increase Indep in ADLs    []  bathroom function and safety  []  kitchen/cooking function and safety  []  shopping  []  raise height for communication at standing level  []  raise height for eye contact which reduces cervical neck strain and pain  []  drive at raised height for safety and navigating crowds  []  Other:   []  Vertical position system  (anterior tilt)     (Drive locks-out)    []  Stand       (Drive enabled)  []  independent weight bearing  []  decrease joint contractures  []  decrease/manage spasticity  []  decrease/manage spasms  []  pressure distribution away from   scapula, sacrum, coccyx, and ischial tuberosity  []  increase digestion and elimination   []  access to counters and cabinets  []  increase reach  []  increase interaction with others at eye level, reduces neck strain  []  increase performance of       MRADL(s)      Power elevating legrest    []  Center mount (Single) 85-170 degrees       []  Standard (Pair) 100-170 degrees  []  position legs at 90 degrees, not available with std power ELR  []  center mount tucks into chair to decrease turning radius in home, not available with std power ELR  []  provide change in position for LE  []  elevate legs during recline    []  maintain placement of feet on      footplate  []  decrease edema  []  improve circulation  []  actuator needed to elevate legrest  []  actuator needed to articulate legrest preventing knees from flexing  []  Increase ground clearance over      curbs  []   STD (pair) independently                     elevate legrest   POWER WHEELCHAIR CONTROLS       Controls/input device  []  Expandable  []  Non-expandable  []  Proportional  []  Right Hand []  Left Hand  []  Non-proportional/switches/head-array  []  Electrical/proximity         []   Mechanical  Manufacturer:___________________   Type:________________________ []  provides access for controlling wheelchair  []  programming for accurate control  []  progressive disease/changing condition  []  required for alternative drive      controls       []  lacks motor control to operate  proportional drive control  []  unable to understand proportional controls  []  limited movement/strength  []  extraneous movement / tremors / ataxic / spastic       []  Upgraded electronics controller/harness    []  Single power (tilt or recline)   []  Expandable    []  Non-expandable plus   []  Multi-power (tilt, recline, power legrest, power seat lift, vertical positioning system, stand)  []  allows input device to communicate with drive motors  []  harness provides necessary connections between the controller, input device, and seat functions     []  needed in order to operate power seat functions through joystick/ input device  []  required for alternative drive controls     []  Enhanced display  []  required to connect all alternative drive controls   []  required for upgraded joystick      (lite-throw, heavy duty, micro)  []  Allows user to see in which mode and drive the wheelchair is set; necessary for alternate controls       []  Upgraded tracking electronics  []  correct tracking when on uneven surfaces makes switch driving more efficient and less fatiguing  []  increase safety when driving  []  increase ability to traverse thresholds    []  Safety / reset / mode switches     Type:    []  Used to change modes and stop the wheelchair when driving     []  Mount for joystick / input device/switches  []  swing away for access or transfers   []  attaches joystick / input device / switches to wheelchair   []  provides for  consistent access  []  midline for optimal placement    []  Attendant controlled joystick plus     mount  []  safety  []  long distance driving  []  operation of seat functions  []  compliance with transportation regulations    [x]  Battery 2 x 12 aH [x]  required to power (power assist / scooter/ power wc / other):   []  Power inverter (24V to 12V)  []  required for ventilator / respiratory equipment / other:     CHAIR OPTIONS MANUAL & POWER      Armrests   []  adjustable height []  removable  []  swing away []  fixed  []  flip back  []  reclining  []  full length pads []  desk []  tube arms []  gel pads  []  provide support with elbow at 90    []  remove/flip back/swing away for  transfers  []  provide support and positioning of upper body    []  allow to come closer to table top  []  remove for access to tables  []  provide support for w/c tray  []  change of height/angles for variable activities   []  Elbow support / Elbow stop  []  keep elbow positioned on arm pad  []  keep arms from falling off arm pad  during tilt and/or recline   Upper Extremity Support  []  Arm trough  []   R  []   L  Style:  []  swivel mount []  fixed mount   []  posterior hand support  []   tray  []  full tray  []  joystick cut out  []   R  []   L  Style:  []  decrease gravitational pull on  shoulders  []  provide support to increase UE  function  []  provide hand support in natural    position  []  position flaccid UE  []  decrease subluxation    []  decrease edema       []  manage spasticity   []  provide midline positioning  []  provide work surface  []  placement for AAC/ Computer/ EADL       Hangers/ Legrests   []  ______ degree  []  Elevating []  articulating  []  swing away []  fixed []  lift off  []  heavy duty  []  adjustable knee angle  []  adjustable calf panel   []  longer extension tube              []  provide LE support  []  maintain placement of feet on      footplate   []  accommodate lower leg length  []  accommodate to  hamstring       tightness  []  enable transfers  []  provide change in position for LE's  []  elevate legs during recline    []  decrease edema  []  durability      Foot support   []  footplate []  R []  L []  flip up           []  Depth adjustable   []  angle adjustable  []  foot board/one piece    []  provide foot support  []  accommodate to ankle ROM  []  allow foot to go under wheelchair base  []  enable transfers     []  Shoe holders  []  position foot    []  decrease / manage spasticity  []  control position of LE  []  stability    []  safety     []  Ankle strap/heel      loops  []  support foot on foot support  []  decrease extraneous movement  []  provide input to heel   []  protect foot     []  Amputee adapter []  R  []  L     Style:                  Size:  []  Provide support for stump/residual extremity    []  Transportation tie-down  []  to provide crash tested tie-down brackets    []  Crutch/cane holder    []  O2 holder    []  IV hanger   []  Ventilator tray/mount    []  stabilize accessory on wheelchair       Component  Justification     []  Seat cushion      []  accommodate impaired sensation  []  decubitus ulcers present or history  []  unable to shift weight  []  increase pressure distribution  []  prevent pelvic extension  []  custom required "off-the-shelf"    seat cushion will not accommodate deformity  []  stabilize/promote pelvis alignment  []  stabilize/promote femur alignment  []  accommodate obliquity  []  accommodate multiple deformity  []  incontinent/accidents  []  low maintenance     []  seat mounts                 []  fixed []  removable  []  attach seat platform/cushion to wheelchair frame    []  Seat wedge    []  provide increased aggressiveness of seat shape to decrease sliding  down in the seat  []  accommodate ROM        []  Cover replacement   []  protect back or seat cushion  []  incontinent/accidents    []  Solid seat / insert    []  support cushion to prevent  hammocking  []  allows  attachment of cushion to mobility base    []  Lateral pelvic/thigh/hip     support (Guides)     []  decrease abduction  []  accommodate pelvis  []  position upper legs  []  accommodate spasticity  []  removable for transfers     []  Lateral pelvic/thigh      supports mounts  []  fixed   []  swing-away   []  removable  []  mounts lateral pelvic/thigh supports     []  mounts lateral pelvic/thigh supports swing-away or removable for transfers    []  Medial thigh support (Pommel)  [] decrease adduction  [] accommodate ROM  []  remove for transfers   []  alignment      []  Medial thigh   []  fixed      support mounts      []  swing-away   []  removable  []  mounts medial thigh supports   []  Mounts medial supports swing- away or removable for transfers       Component  Justification   []  Back       []  provide posterior trunk support []  facilitate tone  []  provide lumbar/sacral support []  accommodate deformity  []  support trunk in midline   []  custom required "off-the-shelf" back support will not accommodate deformity   []  provide lateral trunk support []  accommodate or decrease tone            []  Back mounts  []  fixed  []  removable  []  attach back rest/cushion to wheelchair frame   []  Lateral trunk      supports  []  R []  L  []  decrease lateral trunk leaning  []  accommodate asymmetry    []  contour for increased contact  []  safety    []  control of tone    []  Lateral trunk      supports mounts  []  fixed  []  swing-away   []  removable  []  mounts lateral trunk supports     []  Mounts lateral trunk supports swing-away or removable for transfers   []  Anterior chest      strap, vest     []  decrease forward movement of shoulder  []  decrease forward movement of trunk  []  safety/stability  []  added abdominal support  []  trunk alignment  []  assistance with shoulder control   []  decrease shoulder elevation    []  Headrest      []  provide posterior head support  []  provide posterior neck support  []  provide  lateral head support  []  provide anterior head support  []  support during tilt and recline  []  improve feeding     []  improve respiration  []  placement of switches  []  safety    []  accommodate ROM   []  accommodate tone  []  improve visual orientation   []  Headrest           []  fixed []  removable []  flip down      Mounting hardware   []  swing-away laterals/switches  []  mount headrest   []  mounts headrest flip down or  removable for transfers  []  mount headrest swing-away laterals   []  mount switches     []  Neck Support    []  decrease neck rotation  []  decrease forward neck flexion   Pelvic Positioner    []  std hip belt          []  padded hip belt  []  dual pull hip belt  []  four point hip belt  []  stabilize tone  []  decrease falling out of chair  []   prevent excessive extension  []  special pull angle to control      rotation  []  pad for protection over boney   prominence  []  promote comfort    []  Essential needs        bag/pouch   []  medicines []  special food rorthotics []  clothing changes  []  diapers  []  catheter/hygiene []  ostomy supplies   The above equipment has a life- long use expectancy.  Growth and changes in medical and/or functional conditions would be the exceptions.   SUMMARY:    ASSESSMENT:  Timed up and Go test: 44 seconds with st. Cane  CLINICAL IMPRESSION: The patient is a 63 year old female who was evaluated today for physical therapy and power mobility assessment. Her medical history includes rheumatoid arthritis (RA), multi-joint osteoarthritis (OA), osteopenia, and chronic pain. Objective findings from today's session revealed decreased bilateral shoulder active range of motion (greater on the left), reduced shoulder strength bilaterally, generalized weakness in both lower extremities (greater on the right), and chronic pain affecting both upper and lower extremities. Her Timed Up and Go (TUG) test was completed in 47 seconds using a cane, indicating  significantly limited mobility and a high risk for falls.  Due to limited grip strength, bilateral shoulder pain, upper and lower extremity weakness, chronic pain, and her elevated BMI, the use of standard assistive devices such as a cane, walker, or manual wheelchair is not appropriate or safe for this patient. A Group 2 power wheelchair with a captain's seat was recommended as the most clinically appropriate and safe option to support her mobility needs, improve functional independence, and assist with pain management.  However, the patient expressed a strong preference for a mobility scooter over a power wheelchair. Her daughter was present during the session and confirmed that the patient has used a scooter in the past and feels more comfortable with it. The patient was educated on the limitations of mobility scooters, including reduced maneuverability within the home, stability concerns, and increased risk of falls due to their design. Despite this education, the patient remained adamant that her home environment is spacious enough to accommodate a scooter and that she feels safer and more confident using one.  Given the patient's objective impairments, her motivation and willingness to use a scooter, and her history of successful use, a mobility scooter is recommended as the least costly and most appropriate device to improve her daily function, support pain management, and provide safe in-home mobility. This recommendation is made with the goal of enhancing her ability to perform activities of daily living, increasing independence, and reducing caregiver burden.     OBJECTIVE IMPAIRMENTS Abnormal gait, decreased activity tolerance, decreased balance, decreased endurance, decreased mobility, decreased ROM, decreased strength, hypomobility, increased edema, increased muscle spasms, impaired flexibility, impaired UE functional use, postural dysfunction, obesity, and pain.   ACTIVITY LIMITATIONS  carrying, lifting, standing, squatting, stairs, and transfers  PARTICIPATION LIMITATIONS: meal prep, cleaning, laundry, shopping, and community activity  PERSONAL FACTORS Past/current experiences and Time since onset of injury/illness/exacerbation are also affecting patient's functional outcome.   REHAB POTENTIAL: Good  CLINICAL DECISION MAKING: Stable/uncomplicated  EVALUATION COMPLEXITY: Low                                   GOALS: One time visit. No goals established.    PLAN: PT FREQUENCY: one time visit    Raj LOISE Blanch, PT 11/28/2023, 2:25 PM  I concur with the above findings and recommendations of the therapist:  Physician name printed:         Physician's signature:      Date:

## 2023-12-05 ENCOUNTER — Telehealth: Payer: Self-pay | Admitting: Gastroenterology

## 2023-12-05 ENCOUNTER — Ambulatory Visit: Admitting: Gastroenterology

## 2023-12-05 ENCOUNTER — Encounter: Payer: Self-pay | Admitting: Gastroenterology

## 2023-12-05 VITALS — BP 104/70 | HR 70 | Ht 67.5 in | Wt 242.8 lb

## 2023-12-05 DIAGNOSIS — Z9049 Acquired absence of other specified parts of digestive tract: Secondary | ICD-10-CM

## 2023-12-05 DIAGNOSIS — K5909 Other constipation: Secondary | ICD-10-CM | POA: Diagnosis not present

## 2023-12-05 DIAGNOSIS — Z800 Family history of malignant neoplasm of digestive organs: Secondary | ICD-10-CM

## 2023-12-05 DIAGNOSIS — Z8509 Personal history of malignant neoplasm of other digestive organs: Secondary | ICD-10-CM

## 2023-12-05 DIAGNOSIS — K219 Gastro-esophageal reflux disease without esophagitis: Secondary | ICD-10-CM | POA: Diagnosis not present

## 2023-12-05 DIAGNOSIS — G8929 Other chronic pain: Secondary | ICD-10-CM

## 2023-12-05 DIAGNOSIS — K5904 Chronic idiopathic constipation: Secondary | ICD-10-CM

## 2023-12-05 DIAGNOSIS — R1310 Dysphagia, unspecified: Secondary | ICD-10-CM

## 2023-12-05 MED ORDER — NA SULFATE-K SULFATE-MG SULF 17.5-3.13-1.6 GM/177ML PO SOLN: 1.0000 | Freq: Once | ORAL | 0 refills | Status: AC

## 2023-12-05 MED ORDER — FAMOTIDINE 20 MG PO TABS: 20.0000 mg | ORAL_TABLET | Freq: Every day | ORAL | 1 refills | Status: DC

## 2023-12-05 NOTE — Patient Instructions (Addendum)
 GERD Continue Pantoprazole  40 mg in the morning Continue Pepcid  at bedtime (refilled) GERD diet, no late meals Avoid NSAIDs (motrin , aleve , advil ) Can discuss with Dr. San if you are candidate for TIF procedure for reflux  Chronic constipation Continue Linzess  290mcg po daily OTC Miralax  po daily as needed  Recommend high fiber diet  We have sent the following medications to your pharmacy for you to pick up at your convenience: SUPREP  You have been scheduled for an endoscopy and colonoscopy. Please follow the written instructions given to you at your visit today.  If you use inhalers (even only as needed), please bring them with you on the day of your procedure.  DO NOT TAKE 7 DAYS PRIOR TO TEST- Trulicity (dulaglutide) Ozempic , Wegovy  (semaglutide ) Mounjaro (tirzepatide) Bydureon Bcise (exanatide extended release)  DO NOT TAKE 1 DAY PRIOR TO YOUR TEST Rybelsus  (semaglutide ) Adlyxin (lixisenatide) Victoza (liraglutide) Byetta (exanatide) ___________________________________________________________________________  Due to recent changes in healthcare laws, you may see the results of your imaging and laboratory studies on MyChart before your provider has had a chance to review them.  We understand that in some cases there may be results that are confusing or concerning to you. Not all laboratory results come back in the same time frame and the provider may be waiting for multiple results in order to interpret others.  Please give us  48 hours in order for your provider to thoroughly review all the results before contacting the office for clarification of your results.   _______________________________________________________  If your blood pressure at your visit was 140/90 or greater, please contact your primary care physician to follow up on this.  _______________________________________________________  If you are age 25 or older, your body mass index should be between  23-30. Your Body mass index is 37.47 kg/m. If this is out of the aforementioned range listed, please consider follow up with your Primary Care Provider.  If you are age 74 or younger, your body mass index should be between 19-25. Your Body mass index is 37.47 kg/m. If this is out of the aformentioned range listed, please consider follow up with your Primary Care Provider.   ________________________________________________________  The [place] providers would like to encourage you to use MYCHART to communicate with providers for non-urgent requests or questions.  Due to long hold times on the telephone, sending your provider a message by [facility] may be a faster and more efficient way to get a response.  Please allow 48 business hours for a response.  Please remember that this is for non-urgent requests.  _______________________________________________________  [facility] is using a team-based approach to care.  Your team is made up of your doctor and two to three APPS. Our APPS (Nurse Practitioners and Physician Assistants) work with your physician to ensure care continuity for you. They are fully qualified to address your health concerns and develop a treatment plan. They communicate directly with your gastroenterologist to care for you. Seeing the Advanced Practice Practitioners on your physician's team can help you by facilitating care more promptly, often allowing for earlier appointments, access to diagnostic testing, procedures, and other specialty referrals.   Thank you for trusting me with your gastrointestinal care. Deanna May, FNP-C

## 2023-12-05 NOTE — Telephone Encounter (Signed)
 Inbound call from stating that she seen Deanna May today and wanted to let her know the name of the medication she was needing refilled called Dexlant. Patient requesting a call back. Please advise.

## 2023-12-05 NOTE — Progress Notes (Signed)
 Chief Complaint:abd pain, discuss recall colon Primary GI Doctor:( Previously Dr. Eda) Dr. San   HPI:  Patient is a  63  year old female patient with past medical history of Seropositive rheumatoid arthritis (Deveshwar), osteoarthritis, DDD, benign essential hypertension, transient ischemic attack, chronic HFpEF, obesity, who presents for follow-up  Patient last seen in the GI office on 02/16/2022 by Alan, PA.   Cardiology appointment on 01/24/2023. Reviewed entire note.  Interval History    Patient presents for follow-up on GERD and chronic constipation. Patient has history of GERD and she is currently taking pantoprazole  40 mg p.o. daily and Pepcid  in the evening. She ran out of the Pepcid  and has not been taking.  She will have intermittent epigastric pain and regurgitation in the evening since being off the Pepcid . She has continued with pill dysphagia where they get stuck in back of throat. She will drink water to help it go down.  No nausea or vomiting.     Patient also as history of chronic constipation and taking  Linzess  290mcg po daily. She reports it helps most days but will have to take occasional OTC Miralax  . No abdominal pain.   Patient has had some intermittent vaginal bleeding over the last 2 months, she plans to follow-up with OB/GYN.   No blood thinners.  Nonsmoker. No alcohol use.   She uses OTC Tylenol  prn.  Patient's family history includes:  mother with breast CA, sister with breast CA  Wt Readings from Last 3 Encounters:  12/05/23 242 lb 12.8 oz (110.1 kg)  07/24/23 244 lb (110.7 kg)  05/07/23 235 lb (106.6 kg)    Past Medical History:  Diagnosis Date   Allergy    Anemia    iron therapy for years as of 10/12; normal Hgb 08/2013   Anxiety    Arthritis    Cardiomyopathy (HCC)    Chest pain 04/05/2011   cardiac eval, normal treadmill stress test, Dr. 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 Lynda Leos, [facility]   GERD (gastroesophageal reflux disease)    History of uterine fibroid    Hyperlipidemia    Hypertension    Paresthesia 09/2014   initially thought to be TIA, neurology consult in 12/2014 with other non TIA considerations.     Polyarthralgia    normal rheumatoid screen 01/2012   TIA (transient ischemic attack) 2015/16    Past Surgical History:  Procedure Laterality Date   COLONOSCOPY  01/2014   diverticulosis, othwerise normal - Dr. Toribio Cedar   DILATATION & CURETTAGE/HYSTEROSCOPY WITH MYOSURE N/A 07/14/2021   Procedure: DILATATION & CURETTAGE/HYSTEROSCOPY WITH MYOSURE Reach;  Surgeon: Barbette Knock, MD;  Location: [facility];  Service: Gynecology;  Laterality: N/A;   ESOPHAGOGASTRODUODENOSCOPY  2013   Dr. Rollin, gastritis   ESOPHAGOGASTRODUODENOSCOPY  [id number]   EUS N/A 04/16/2014   Procedure: UPPER ENDOSCOPIC ULTRASOUND (EUS) LINEAR;  Surgeon: Toribio SHAUNNA Cedar, MD;  Location: [facility];  Service: Endoscopy;  Laterality: N/A;   gall stone surgery     KNEE ARTHROSCOPY Left    LAPAROSCOPIC GASTRIC RESECTION N/A 06/09/2014   Procedure: LAPAROSCOPIC GASTRIC MASS RESECTION;  Surgeon: Lynda Leos, MD;  Location: [facility];  Service: General;  Laterality: N/A;   LIPOMA EXCISION     forehead  LUMBAR LAMINECTOMY N/A 04/25/2019   Procedure: LEFT L2-3 MICRODISCECTOMY, BILATERAL L5-S1 PARTIAL HEMILAMINECTOMY;  Surgeon: Lucilla Lynwood BRAVO, MD;  Location: [facility];  Service: Orthopedics;  Laterality: N/A;   UPPER GASTROINTESTINAL ENDOSCOPY     UTERINE FIBROID SURGERY      Current Outpatient Medications  Medication Sig Dispense Refill   albuterol (VENTOLIN HFA) 108 (90 Base) MCG/ACT inhaler SMARTSIG:2 Puff(s) By Mouth Every 6 Hours PRN     blood glucose meter kit and supplies KIT Test once daily-[id number] 1 each 0   carvedilol   (COREG ) 6.25 MG tablet Take 1 tablet (6.25 mg total) by mouth 2 (two) times daily. 180 tablet 3   Cholecalciferol (VITAMIN D3) 125 MCG (5000 UT) CAPS Take 2,000 Units by mouth daily.     Cyanocobalamin  (VITAMIN B-12 PO) Take by mouth daily.     diclofenac  Sodium (VOLTAREN ) 1 % GEL Apply 2-4 grams to affected joint 4 times daily as needed. 400 g 2   docusate sodium  (COLACE) 100 MG capsule Take 100 mg by mouth daily.     ENTRESTO  49-51 MG Take 1 tablet by mouth 2 (two) times daily.     EQ ASPIRIN  ADULT LOW DOSE 81 MG EC tablet Take 1 tablet by mouth once daily 90 tablet 0   FARXIGA  10 MG TABS tablet Take 10 mg by mouth every morning.     fluticasone  (FLONASE ) 50 MCG/ACT nasal spray Place 2 sprays into both nostrils in the morning and at bedtime. 16 mL 2   gabapentin  (NEURONTIN ) 300 MG capsule TAKE 1 CAPSULE BY MOUTH THREE TIMES DAILY 90 capsule 5   Glucose Blood (BLOOD GLUCOSE TEST STRIPS) STRP Inject 1 each as directed daily. 100 strip 5   hydroxychloroquine  (PLAQUENIL ) 200 MG tablet Take 1 tablet by mouth twice daily 180 tablet 0   Insulin Pen Needle (BD PEN NEEDLE NANO U/F) 32G X 4 MM MISC 1 each by Does not apply route at bedtime. 100 each 11   latanoprost (XALATAN) 0.005 % ophthalmic solution Place 1 drop into both eyes at bedtime.     lidocaine  (LIDODERM ) 5 % Place 1 patch onto the skin daily. Remove & Discard patch within 12 hours or as directed by MD 15 patch 0   LINZESS  290 MCG CAPS capsule TAKE 1 CAPSULE BY MOUTH ONCE DAILY BEFORE BREAKFAST 90 capsule 0   Multiple Vitamin (MULTIVITAMIN PO) Take by mouth daily.     Omega-3 Fatty Acids (OMEGA-3 FISH OIL PO) Take 690 mg by mouth daily.     ondansetron  (ZOFRAN ) 4 MG tablet TAKE 1 TABLET BY MOUTH EVERY 8 HOURS AS NEEDED FOR NAUSEA OR VOMITING 90 tablet 0   pantoprazole  (PROTONIX ) 40 MG tablet Take 1 tablet by mouth once daily 90 tablet 0   potassium chloride  (KLOR-CON ) 10 MEQ tablet Take 1 tablet by mouth twice daily 180 tablet 0   pyridOXINE  (VITAMIN B6) 100 MG tablet Take 100 mg by mouth daily.     RESTASIS 0.05 % ophthalmic emulsion 1 drop 2 (two) times daily.     rosuvastatin  (CRESTOR ) 20 MG tablet Take 1 tablet (20 mg total) by mouth daily. 90 tablet 3   spironolactone  (ALDACTONE ) 25 MG tablet Take 1 tablet by mouth once daily 90 tablet 2   TURMERIC PO Take 500 mg by mouth.     venlafaxine  XR (EFFEXOR -XR) 37.5 MG 24 hr capsule TAKE 1 CAPSULE DAILY WITH BREAKFAST. 90 capsule 0   Vitamin A 3 MG (10000 UT) TABS Take by mouth.  famotidine  (PEPCID ) 20 MG tablet Take 1 tablet (20 mg total) by mouth daily. 90 tablet 1   No current facility-administered medications for this visit.    Allergies as of 12/05/2023 - Review Complete 12/05/2023  Allergen Reaction Noted   Kiwi extract Hives and Itching 09/27/2015   Mucinex dm [dm-guaifenesin er] Nausea Only 01/21/2015   Peach [prunus persica] Itching 09/27/2015    Family History  Problem Relation Age of Onset   Hypertension Mother    Arthritis Mother    GER disease Mother    Glaucoma Mother    Breast cancer Mother 37   Stroke Father    Breast cancer Sister 86   Hypertension Sister    Leukemia Sister    Hypertension Sister    Colon cancer Brother 5   Diabetes Brother    Diabetes Maternal Aunt    Heart disease Maternal Aunt    Heart disease Maternal Uncle    Stroke Maternal Uncle    Heart disease Maternal Grandmother    Heart disease Maternal Grandfather    Healthy Son    Colon polyps Neg Hx    Esophageal cancer Neg Hx    Stomach cancer Neg Hx    Rectal cancer Neg Hx     Review of Systems:    Constitutional: No weight loss, fever, chills, weakness or fatigue HEENT: Eyes: No change in vision               Ears, Nose, Throat:  No change in hearing or congestion Skin: No rash or itching Cardiovascular: No chest pain, chest pressure or palpitations   Respiratory: No SOB or cough Gastrointestinal: See HPI and otherwise negative Genitourinary: No dysuria or change  in urinary frequency Neurological: No headache, dizziness or syncope Musculoskeletal: No new muscle or joint pain Hematologic: No bleeding or bruising Psychiatric: No history of depression or anxiety    Physical Exam:  Vital signs: BP 104/70   Pulse 70   Ht 5' 7.5 (1.715 m)   Wt 242 lb 12.8 oz (110.1 kg)   LMP 02/23/2015 (LMP Unknown)   BMI 37.47 kg/m   Constitutional:   Pleasant female appears to be in NAD, Well developed, Well nourished, alert and cooperative Throat: Oral cavity and pharynx without inflammation, swelling or lesion.  Respiratory: Respirations even and unlabored. Lungs clear to auscultation bilaterally.   No wheezes, crackles, or rhonchi.  Cardiovascular: Normal S1, S2. Regular rate and rhythm. No peripheral edema, cyanosis or pallor.  Gastrointestinal:  Soft, nondistended, nontender. No rebound or guarding.  Hypoactive bowel sounds. No appreciable masses or hepatomegaly. Rectal:  Not performed.  Msk:  Symmetrical without gross deformities. Without edema, no deformity or joint abnormality.  Neurologic:  Alert and  oriented x4;  grossly normal neurologically.  Skin:   Dry and intact without significant lesions or rashes.  RELEVANT LABS AND IMAGING: CBC    Latest Ref Rng & Units 07/24/2023    3:28 PM 12/20/2022   12:14 PM 07/18/2022   11:12 AM  CBC  WBC 3.8 - 10.8 Thousand/uL 5.6  6.4  6.3   Hemoglobin 11.7 - 15.5 g/dL 87.0  87.1  87.1   Hematocrit 35.0 - 45.0 % 40.8  41.6  41.0   Platelets 140 - 400 Thousand/uL 318  298  303      CMP     Latest Ref Rng & Units 07/24/2023    3:28 PM 01/24/2023    9:25 AM 12/20/2022   12:14 PM  CMP  Glucose  65 - 99 mg/dL 78  83  81   BUN 7 - 25 mg/dL 14  15  13    Creatinine 0.50 - 1.05 mg/dL 9.09  9.18  9.23   Sodium 135 - 146 mmol/L 143  143  142   Potassium 3.5 - 5.3 mmol/L 4.2  4.1  4.7   Chloride 98 - 110 mmol/L 107  106  105   CO2 20 - 32 mmol/L 31  27  31    Calcium  8.6 - 10.4 mg/dL 9.2  9.4  9.6   Total Protein  6.1 - 8.1 g/dL 6.6  6.7  6.7   Total Bilirubin 0.2 - 1.2 mg/dL 0.3  <9.7  0.3   Alkaline Phos 44 - 121 IU/L  110    AST 10 - 35 U/L 18  21  20    ALT 6 - 29 U/L 17  18  17       Lab Results  Component Value Date   TSH 1.700 11/19/2019   07/2022 echo-Mildly depressed LV systolic function with visual EF 45-50%.   2012 abdominal ultrasound cholelithiasis 02/2012 EGD Dr. Rollin for gastritis biopsies not available 01/2014 Colonoscopy Dr. Teressa found diverticulosis but no polyps, recommended recall colonoscopy at 5 year interval.  2016 CT abdomen pelvis with incidental gastric wall mass 04/2014 EUS with Dr. Teressa 0.9 x 2.5 GIST anterior wall of stomach refer to surgeon to consider resection and also cholecystectomy at same time status post lap resection 06/2014 3 cm  with low mitotic activity and clear margins Dr. Rubin 02/11/2019 colonoscopy Dr. Teressa for screening with family history of first-degree relative with colorectal cancer before age 29 showed diverticulosis otherwise unremarkable recall 5 years 02/27/2022 CTAP IMPRESSION: 1. No acute findings are noted in the abdomen or pelvis to account for the patient's symptoms. 2. Status post surgical resection of GIST along the greater curvature of the stomach with no findings to suggest locally recurrent disease. 3. Additional incidental findings, as above. 03/01/2022 esophagram/barium swallow/tablet study IMPRESSION: Normal esophagram.    Assessment/Plan: #1 History of gastrointestinal stromal tumor (GIST)  Status post resection 2016, Last CT scan 02/2022 showed no recurrent disease. -We will plan on repeating CT abdomen pelvis to evaluate previous site of resection #2 screening colonoscopy, history of first-degree relative with colon rectal cancer before age 7 -Schedule for a colonoscopy with 2 day prep with Dr. San in [facility] The risks and benefits of colonoscopy with possible polypectomy / biopsies were discussed and the  patient agrees to proceed.  (02/2024) #3 history of chronic HFrEF  -07/2022 echo-Mildly depressed LV systolic function with visual EF 45-50%.  #4 GERD Epigastric pain -continue pantoprazole  40 mg po daily -Restart Pepcid  20 mg at bedtime, refilled -Avoid NSAIDs -schedule EGD with Dr. San in [facility]. The risks and benefits of EGD with possible biopsies and esophageal dilation were discussed with the patient who agrees to proceed. -Patient enquires about surgical options for GERD and if she is candidate, can discuss with Dr. San post procedures #5 Chronic constipation -recommend high fiber diet -Continue Linzess  290mcg po daily -Continue OTC Miralax  prn  #6  Abdominal bleeding, post menopause, Hgb stable 12.9 -Recommend patient follow-up with OB/GYN  Thank you for the courtesy of this consult. Please call me with any questions or concerns.   Umair Rosiles, FNP-C [facility] 12/05/2023, 2:42 PM  Cc: Maree Leni Edyth DELENA, MD'

## 2023-12-06 ENCOUNTER — Other Ambulatory Visit: Payer: Self-pay | Admitting: Gastroenterology

## 2023-12-06 NOTE — Progress Notes (Signed)
 Agree with the assessment and plan as outlined by [facility], FNP-C.  Carlitos Bottino, DO, [facility]

## 2023-12-07 ENCOUNTER — Other Ambulatory Visit: Payer: Self-pay | Admitting: Gastroenterology

## 2023-12-07 DIAGNOSIS — K219 Gastro-esophageal reflux disease without esophagitis: Secondary | ICD-10-CM

## 2023-12-07 MED ORDER — DEXLANSOPRAZOLE 60 MG PO CPDR: 60.0000 mg | DELAYED_RELEASE_CAPSULE | Freq: Every day | ORAL | 3 refills | Status: AC

## 2023-12-07 NOTE — Progress Notes (Signed)
 Rx dexialnt requested and sent

## 2023-12-20 NOTE — Progress Notes (Deleted)
 Office Visit Note  [name]             Date of Birth: 1960-12-04           MRN: [id number]             PCP: Maree Leni Edyth DELENA, MD Referring: Maree Leni Edyth DELENA, MD Visit Date: 01/02/2024 Occupation: Data Unavailable  Subjective:  No chief complaint on file.   History of Present Illness: Lisa Briggs is a 63 y.o. female ***     Activities of Daily Living:  Patient reports morning stiffness for *** {minute/hour:19697}.   Patient {ACTIONS;DENIES/REPORTS:21021675::Denies} nocturnal pain.  Difficulty dressing/grooming: {ACTIONS;DENIES/REPORTS:21021675::Denies} Difficulty climbing stairs: {ACTIONS;DENIES/REPORTS:21021675::Denies} Difficulty getting out of chair: {ACTIONS;DENIES/REPORTS:21021675::Denies} Difficulty using hands for taps, buttons, cutlery, and/or writing: {ACTIONS;DENIES/REPORTS:21021675::Denies}  No Rheumatology ROS completed.   PMFS History:  Patient Active Problem List   Diagnosis Date Noted   Unilateral primary osteoarthritis, right knee 05/04/2022   Bilateral ankle pain 09/15/2021   Chronic HFrEF (heart failure with reduced ejection fraction) (HCC) 07/29/2021   Advance directive discussed with patient 01/27/2021   Medicare annual wellness visit, subsequent 01/27/2021   Needs flu shot 01/27/2021   At moderate risk for fall 01/27/2021   Prediabetes 02/19/2020   Menopause 11/25/2019   Cardiomyopathy (HCC) 11/19/2019   Rheumatoid arthritis involving multiple sites with positive rheumatoid factor (HCC) 11/19/2019   Primary osteoarthritis involving multiple joints 11/19/2019   Greater trochanteric bursitis, right 11/19/2019   Decreased activities of daily living (ADL) 11/19/2019   Gait disturbance 11/19/2019   Use of cane as ambulatory aid 11/19/2019   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    Class: Chronic   Spinal stenosis of lumbar region 04/25/2019    Class: Chronic   Status post lumbar laminectomy 04/25/2019    Chronic pain of right knee 09/13/2018   Chronic pain of left knee 07/08/2018   Chronic hip pain 07/08/2018   Edema 07/08/2018   Need for shingles vaccine 12/05/2017   Primary osteoarthritis of both hands 11/23/2017   Primary osteoarthritis of both knees 11/23/2017   Primary osteoarthritis of both feet 11/23/2017   DDD (degenerative disc disease), lumbar 11/23/2017   Vaccine counseling 08/13/2017   Sensitive skin 01/23/2017   Need for influenza vaccination 01/23/2017   History of gastrointestinal stromal tumor (GIST) 04/20/2016   History of TIA (transient ischemic attack) 04/20/2016   Screening for breast cancer 04/20/2016   Estrogen deficiency 04/20/2016   Impaired fasting blood sugar 04/20/2016   Constipation 04/20/2016   History of fall 04/20/2016   Chronic radicular lumbar pain 01/27/2016   Encounter for health maintenance examination in adult 03/22/2015   Paresthesia 03/22/2015   Cognitive decline 03/22/2015   Screening for cervical cancer 03/22/2015   Vitamin D  deficiency 03/22/2015   History of uterine leiomyoma 03/22/2015   Gastroesophageal reflux disease without esophagitis 03/02/2014   Chronic nausea 03/02/2014   Rhinitis, allergic 03/02/2014   Essential hypertension 03/02/2014   Hyperlipidemia 03/02/2014   Obesity with serious comorbidity 01/16/2012    Past Medical History:  Diagnosis Date   Allergy    Anemia    iron therapy for years as of 10/12; normal Hgb 08/2013   Anxiety    Arthritis    Cardiomyopathy (HCC)    Chest pain 04/05/2011   cardiac eval, normal treadmill stress test, Dr. Jacques Somerset   CHF (congestive heart failure) (HCC)    Chronic back pain    Constipation    Farsightedness  wears glasses, Eye care center   Gastrointestinal stromal tumor (GIST) (HCC) 06/2014   Dr. Lynda Leos, [facility]   GERD (gastroesophageal reflux disease)    History of uterine fibroid    Hyperlipidemia    Hypertension    Paresthesia 09/2014    initially thought to be TIA, neurology consult in 12/2014 with other non TIA considerations.     Polyarthralgia    normal rheumatoid screen 01/2012   TIA (transient ischemic attack) 2015/16    Family History  Problem Relation Age of Onset   Hypertension Mother    Arthritis Mother    GER disease Mother    Glaucoma Mother    Breast cancer Mother 71   Stroke Father    Breast cancer Sister 12   Hypertension Sister    Leukemia Sister    Hypertension Sister    Colon cancer Brother 54   Diabetes Brother    Diabetes Maternal Aunt    Heart disease Maternal Aunt    Heart disease Maternal Uncle    Stroke Maternal Uncle    Heart disease Maternal Grandmother    Heart disease Maternal Grandfather    Healthy Son    Colon polyps Neg Hx    Esophageal cancer Neg Hx    Stomach cancer Neg Hx    Rectal cancer Neg Hx    Past Surgical History:  Procedure Laterality Date   COLONOSCOPY  01/2014   diverticulosis, othwerise normal - Dr. Toribio Cedar   DILATATION & CURETTAGE/HYSTEROSCOPY WITH MYOSURE N/A 07/14/2021   Procedure: DILATATION & CURETTAGE/HYSTEROSCOPY WITH MYOSURE Reach;  Surgeon: Barbette Knock, MD;  Location: [facility];  Service: Gynecology;  Laterality: N/A;   ESOPHAGOGASTRODUODENOSCOPY  2013   Dr. Rollin, gastritis   ESOPHAGOGASTRODUODENOSCOPY  [id number]   EUS N/A 04/16/2014   Procedure: UPPER ENDOSCOPIC ULTRASOUND (EUS) LINEAR;  Surgeon: Toribio SHAUNNA Cedar, MD;  Location: [facility];  Service: Endoscopy;  Laterality: N/A;   gall stone surgery     KNEE ARTHROSCOPY Left    LAPAROSCOPIC GASTRIC RESECTION N/A 06/09/2014   Procedure: LAPAROSCOPIC GASTRIC MASS RESECTION;  Surgeon: Lynda Leos, MD;  Location: [facility];  Service: General;  Laterality: N/A;   LIPOMA EXCISION     forehead   LUMBAR LAMINECTOMY N/A 04/25/2019   Procedure: LEFT L2-3 MICRODISCECTOMY, BILATERAL L5-S1 PARTIAL HEMILAMINECTOMY;  Surgeon: Lucilla Lynwood BRAVO, MD;  Location: [facility];  Service: Orthopedics;   Laterality: N/A;   UPPER GASTROINTESTINAL ENDOSCOPY     UTERINE FIBROID SURGERY     Social History   Tobacco Use   Smoking status: Never    Passive exposure: Current   Smokeless tobacco: Never  Vaping Use   Vaping status: Never Used  Substance Use Topics   Alcohol use: No    Alcohol/week: 0.0 standard drinks of alcohol   Drug use: No   Social History   Social History Narrative   Married, has 1 son in [place]  and some grandchildren.  Location manager.  Active on job.  Does stretching and exercises daily as per physical therapy.  Works 12 hours daily.       Immunization History  Administered Date(s) Administered   Influenza,inj,Quad PF,6+ Mos 11/24/2013, 01/18/2015, 01/23/2017, 12/05/2017, 12/17/2018, 11/19/2019, 01/27/2021   PFIZER Comirnaty(Gray Top)Covid-19 Tri-Sucrose Vaccine 07/12/2020   PFIZER(Purple Top)SARS-COV-2 Vaccination 06/16/2019, 07/08/2019, 01/15/2020   PNEUMOCOCCAL CONJUGATE-20 07/28/2021   Pneumococcal Polysaccharide-23 05/21/2015   Tdap 01/17/2011, 06/25/2021   Zoster Recombinant(Shingrix ) 10/05/2017, 12/07/2017     Objective: Vital Signs: LMP 02/23/2015 (LMP  Unknown)    Physical Exam   Musculoskeletal Exam: ***  CDAI Exam: CDAI Score: -- Patient Global: --; Provider Global: -- Swollen: --; Tender: -- Joint Exam 01/02/2024   No joint exam has been documented for this visit   There is currently no information documented on the homunculus. Go to the Rheumatology activity and complete the homunculus joint exam.  Investigation: No additional findings.  Imaging: No results found.  Recent Labs: Lab Results  Component Value Date   WBC 5.6 07/24/2023   HGB 12.9 07/24/2023   PLT 318 07/24/2023   NA 143 07/24/2023   K 4.2 07/24/2023   CL 107 07/24/2023   CO2 31 07/24/2023   GLUCOSE 78 07/24/2023   BUN 14 07/24/2023   CREATININE 0.90 07/24/2023   BILITOT 0.3 07/24/2023   ALKPHOS 110 01/24/2023   AST 18 07/24/2023   ALT 17 07/24/2023    PROT 6.6 07/24/2023   ALBUMIN 4.0 01/24/2023   CALCIUM  9.2 07/24/2023   GFRAA 115 05/11/2020    Speciality Comments: PLQ Eye Exam: 11/05/2023 WNL @ [facility] Follow up in 1 year  Procedures:  No procedures performed Allergies: Kiwi extract, Mucinex dm [dm-guaifenesin er], and Peach [prunus persica]   Assessment / Plan:     Visit Diagnoses: No diagnosis found.  Orders: No orders of the defined types were placed in this encounter.  No orders of the defined types were placed in this encounter.   Face-to-face time spent with patient was *** minutes. Greater than 50% of time was spent in counseling and coordination of care.  Follow-Up Instructions: No follow-ups on file.   Daved JAYSON Gavel, CMA  Note - This record has been created using Animal nutritionist.  Chart creation errors have been sought, but may not always  have been located. Such creation errors do not reflect on  the standard of medical care.

## 2023-12-24 ENCOUNTER — Other Ambulatory Visit: Payer: Self-pay | Admitting: Family Medicine

## 2023-12-24 DIAGNOSIS — Z1231 Encounter for screening mammogram for malignant neoplasm of breast: Secondary | ICD-10-CM

## 2023-12-25 ENCOUNTER — Ambulatory Visit

## 2023-12-28 ENCOUNTER — Ambulatory Visit
Admission: RE | Admit: 2023-12-28 | Discharge: 2023-12-28 | Disposition: A | Discharge status: Home / Self Care | Source: Ambulatory Visit | Attending: Family Medicine | Admitting: Family Medicine

## 2023-12-28 DIAGNOSIS — Z1231 Encounter for screening mammogram for malignant neoplasm of breast: Secondary | ICD-10-CM

## 2024-01-02 ENCOUNTER — Ambulatory Visit: Admitting: Rheumatology

## 2024-01-02 DIAGNOSIS — I10 Essential (primary) hypertension: Secondary | ICD-10-CM

## 2024-01-02 DIAGNOSIS — K219 Gastro-esophageal reflux disease without esophagitis: Secondary | ICD-10-CM

## 2024-01-02 DIAGNOSIS — M85852 Other specified disorders of bone density and structure, left thigh: Secondary | ICD-10-CM

## 2024-01-02 DIAGNOSIS — Z8639 Personal history of other endocrine, nutritional and metabolic disease: Secondary | ICD-10-CM

## 2024-01-02 DIAGNOSIS — Z8673 Personal history of transient ischemic attack (TIA), and cerebral infarction without residual deficits: Secondary | ICD-10-CM

## 2024-01-02 DIAGNOSIS — M19041 Primary osteoarthritis, right hand: Secondary | ICD-10-CM

## 2024-01-02 DIAGNOSIS — M19071 Primary osteoarthritis, right ankle and foot: Secondary | ICD-10-CM

## 2024-01-02 DIAGNOSIS — M170 Bilateral primary osteoarthritis of knee: Secondary | ICD-10-CM

## 2024-01-02 DIAGNOSIS — M542 Cervicalgia: Secondary | ICD-10-CM

## 2024-01-02 DIAGNOSIS — M47816 Spondylosis without myelopathy or radiculopathy, lumbar region: Secondary | ICD-10-CM

## 2024-01-02 DIAGNOSIS — E559 Vitamin D deficiency, unspecified: Secondary | ICD-10-CM

## 2024-01-02 DIAGNOSIS — M25361 Other instability, right knee: Secondary | ICD-10-CM

## 2024-01-02 DIAGNOSIS — Z79899 Other long term (current) drug therapy: Secondary | ICD-10-CM

## 2024-01-02 DIAGNOSIS — M0579 Rheumatoid arthritis with rheumatoid factor of multiple sites without organ or systems involvement: Secondary | ICD-10-CM

## 2024-01-02 DIAGNOSIS — M79641 Pain in right hand: Secondary | ICD-10-CM

## 2024-01-02 DIAGNOSIS — M5030 Other cervical disc degeneration, unspecified cervical region: Secondary | ICD-10-CM

## 2024-01-06 ENCOUNTER — Other Ambulatory Visit: Payer: Self-pay | Admitting: Cardiology

## 2024-01-06 ENCOUNTER — Other Ambulatory Visit: Payer: Self-pay | Admitting: Physician Assistant

## 2024-01-06 DIAGNOSIS — I5022 Chronic systolic (congestive) heart failure: Secondary | ICD-10-CM

## 2024-01-06 DIAGNOSIS — I429 Cardiomyopathy, unspecified: Secondary | ICD-10-CM

## 2024-01-06 DIAGNOSIS — M0579 Rheumatoid arthritis with rheumatoid factor of multiple sites without organ or systems involvement: Secondary | ICD-10-CM

## 2024-01-07 NOTE — Telephone Encounter (Signed)
LMOM for patient to call and schedule follow-up appointment.   °

## 2024-01-07 NOTE — Telephone Encounter (Signed)
 Last Fill: 10/25/2023  Eye exam: 11/05/2023 WNL   Labs: 07/24/2023 CBC and CMP are normal.  Next Visit: Due 12/24/2023. Message sent to the front to schedule.   Last Visit: 07/24/2023  IK:Myzlfjunpi arthritis involving multiple sites with positive rheumatoid factor (HCC)   Current Dose per office note 07/24/2023: Plaquenil  200 mg 1 tablet by mouth twice daily.   Contacted the patient to advise labs are due. Patient states she had labs done at her PCP and does not know if it is what we need or not. Will call PCP for labs. Patient also states she did not come to her appointment last week because she was sick. Patient states she will call the office back to schedule and appointment. Contacted the PCP and they will fax labs over but did not say what the patient had drawn.   Okay to refill Plaquenil ?

## 2024-01-07 NOTE — Telephone Encounter (Signed)
 Please schedule patient a follow up visit. Patient due 12/24/2023. Thanks!  Patient states she had missed her last appointment because she was sick and will call back to schedule.

## 2024-01-09 ENCOUNTER — Encounter: Payer: Self-pay | Admitting: Gastroenterology

## 2024-01-09 ENCOUNTER — Ambulatory Visit: Admitting: Gastroenterology

## 2024-01-09 VITALS — BP 111/70 | HR 73 | Temp 97.2°F | Resp 11 | Ht 67.5 in | Wt 242.0 lb

## 2024-01-09 DIAGNOSIS — K2980 Duodenitis without bleeding: Secondary | ICD-10-CM | POA: Diagnosis not present

## 2024-01-09 DIAGNOSIS — K219 Gastro-esophageal reflux disease without esophagitis: Secondary | ICD-10-CM

## 2024-01-09 DIAGNOSIS — K562 Volvulus: Secondary | ICD-10-CM | POA: Diagnosis not present

## 2024-01-09 DIAGNOSIS — K2950 Unspecified chronic gastritis without bleeding: Secondary | ICD-10-CM | POA: Diagnosis not present

## 2024-01-09 DIAGNOSIS — Q439 Congenital malformation of intestine, unspecified: Secondary | ICD-10-CM | POA: Diagnosis not present

## 2024-01-09 DIAGNOSIS — G8929 Other chronic pain: Secondary | ICD-10-CM

## 2024-01-09 DIAGNOSIS — K3189 Other diseases of stomach and duodenum: Secondary | ICD-10-CM

## 2024-01-09 DIAGNOSIS — K640 First degree hemorrhoids: Secondary | ICD-10-CM

## 2024-01-09 DIAGNOSIS — K5730 Diverticulosis of large intestine without perforation or abscess without bleeding: Secondary | ICD-10-CM | POA: Diagnosis not present

## 2024-01-09 DIAGNOSIS — Z8509 Personal history of malignant neoplasm of other digestive organs: Secondary | ICD-10-CM

## 2024-01-09 DIAGNOSIS — K648 Other hemorrhoids: Secondary | ICD-10-CM

## 2024-01-09 DIAGNOSIS — Z1211 Encounter for screening for malignant neoplasm of colon: Secondary | ICD-10-CM

## 2024-01-09 DIAGNOSIS — Z800 Family history of malignant neoplasm of digestive organs: Secondary | ICD-10-CM

## 2024-01-09 DIAGNOSIS — Z7722 Contact with and (suspected) exposure to environmental tobacco smoke (acute) (chronic): Secondary | ICD-10-CM

## 2024-01-09 MED ORDER — SODIUM CHLORIDE 0.9 % IV SOLN: 500.0000 mL | Freq: Once | INTRAVENOUS | Status: DC

## 2024-01-09 NOTE — Progress Notes (Signed)
 Called to room to assist during endoscopic procedure.  Patient ID and intended procedure confirmed with present staff. Received instructions for my participation in the procedure from the performing physician.

## 2024-01-09 NOTE — Op Note (Signed)
 [facility] Patient Name: Lisa Briggs Procedure Date: 01/09/2024 9:01 AM MRN: [id number] Endoscopist: Sandor Flatter , MD, [id number] Age: 63 Referring MD:  Date of Birth: 1960/09/20 Gender: Female Account #: [id number] Procedure:                Upper GI endoscopy Indications:              Epigastric abdominal pain, Pill Dysphagia,                            Heartburn, Suspected esophageal reflux, History of                            GIST s/p resection 2016 Medicines:                Monitored Anesthesia Care Procedure:                Pre-Anesthesia Assessment:                           - Prior to the procedure, a History and Physical                            was performed, and patient medications and                            allergies were reviewed. The patient's tolerance of                            previous anesthesia was also reviewed. The risks                            and benefits of the procedure and the sedation                            options and risks were discussed with the patient.                            All questions were answered, and informed consent                            was obtained. Prior Anticoagulants: The patient has                            taken no anticoagulant or antiplatelet agents. ASA                            Grade Assessment: III - A patient with severe                            systemic disease. After reviewing the risks and                            benefits, the patient was deemed in satisfactory  condition to undergo the procedure.                           After obtaining informed consent, the endoscope was                            passed under direct vision. Throughout the                            procedure, the patient's blood pressure, pulse, and                            oxygen saturations were monitored continuously. The                            Endoscope was introduced  through the mouth, and                            advanced to the third part of duodenum. The upper                            GI endoscopy was accomplished without difficulty.                            The patient tolerated the procedure well. Scope In: Scope Out: Findings:                 The examined esophagus was normal.                           The entire examined stomach was normal. Biopsies                            were taken with a cold forceps for Helicobacter                            pylori testing. Estimated blood loss was minimal.                           The examined duodenum was normal. Biopsies were                            taken with a cold forceps for histology. Estimated                            blood loss was minimal. Complications:            No immediate complications. Estimated Blood Loss:     Estimated blood loss was minimal. Impression:               - Normal esophagus.                           - Normal stomach. Biopsied.                           -  Normal examined duodenum. Biopsied. Recommendation:           - Patient has a contact number available for                            emergencies. The signs and symptoms of potential                            delayed complications were discussed with the                            patient. Return to normal activities tomorrow.                            Written discharge instructions were provided to the                            patient.                           - Resume previous diet.                           - Continue present medications.                           - Await pathology results.                           - Perform a colonoscopy today. Sandor Flatter, MD 01/09/2024 9:44:47 AM

## 2024-01-09 NOTE — Op Note (Signed)
 [facility] Patient Name: Lisa Briggs Procedure Date: 01/09/2024 9:00 AM MRN: [id number] Endoscopist: Sandor Flatter , MD, [id number] Age: 63 Referring MD:  Date of Birth: Mar 08, 1961 Gender: Female Account #: [id number] Procedure:                Colonoscopy Indications:              Screening in patient at increased risk: Colorectal                            cancer in brother before age 1                           Last colonoscopy was 02/2019 and notable for                            diverticulosis, otherwise unremarkable with                            recommendation repeat in 5 years. Medicines:                Monitored Anesthesia Care Procedure:                Pre-Anesthesia Assessment:                           - Prior to the procedure, a History and Physical                            was performed, and patient medications and                            allergies were reviewed. The patient's tolerance of                            previous anesthesia was also reviewed. The risks                            and benefits of the procedure and the sedation                            options and risks were discussed with the patient.                            All questions were answered, and informed consent                            was obtained. Prior Anticoagulants: The patient has                            taken no anticoagulant or antiplatelet agents. ASA                            Grade Assessment: III - A patient with severe  systemic disease. After reviewing the risks and                            benefits, the patient was deemed in satisfactory                            condition to undergo the procedure.                           After obtaining informed consent, the colonoscope                            was passed under direct vision. Throughout the                            procedure, the patient's blood pressure, pulse, and                             oxygen saturations were monitored continuously. The                            Olympus Scope [id number] was introduced through the                            anus and advanced to the the cecum, identified by                            appendiceal orifice and ileocecal valve. The                            colonoscopy was performed without difficulty. The                            patient tolerated the procedure well. The quality                            of the bowel preparation was good. The ileocecal                            valve, appendiceal orifice, and rectum were                            photographed. Scope In: 9:15:34 AM Scope Out: 9:37:59 AM Scope Withdrawal Time: 0 hours 9 minutes 53 seconds  Total Procedure Duration: 0 hours 22 minutes 25 seconds  Findings:                 The perianal and digital rectal examinations were                            normal.                           A few small-mouthed diverticula were found in the  sigmoid colon and ascending colon.                           The sigmoid colon was moderately tortuous and there                            was moderate looping in the ascending colon.                            Advancing the scope required using manual pressure                            and straightening and shortening the scope to                            obtain bowel loop reduction.                           Non-bleeding internal hemorrhoids were found during                            retroflexion. The hemorrhoids were small. Complications:            No immediate complications. Estimated Blood Loss:     Estimated blood loss: none. Impression:               - Diverticulosis in the sigmoid colon and in the                            ascending colon.                           - Tortuous colon.                           - Non-bleeding internal hemorrhoids.                           -  No specimens collected. Recommendation:           - Patient has a contact number available for                            emergencies. The signs and symptoms of potential                            delayed complications were discussed with the                            patient. Return to normal activities tomorrow.                            Written discharge instructions were provided to the                            patient.                           -  Resume previous diet.                           - Continue present medications.                           - Repeat colonoscopy in 5 years for screening                            purposes.                           - Return to GI clinic PRN. Sandor Flatter, MD 01/09/2024 9:48:49 AM

## 2024-01-09 NOTE — Progress Notes (Signed)
 Report to PACU, RN, vss, BBS= Clear.

## 2024-01-09 NOTE — Progress Notes (Signed)
 GASTROENTEROLOGY PROCEDURE H&P NOTE   Primary Care Physician: Maree Leni Edyth DELENA, MD    Reason for Procedure:   GERD, constipation, change in stools, epigastric pain, regurgitation, dysphagia, Fhx of colon cancer, colon cancer screening  Plan:    EGD, colonoscopy  Patient is appropriate for endoscopic procedure(s) in the ambulatory ([facility]) setting.  The nature of the procedure, as well as the risks, benefits, and alternatives were carefully and thoroughly reviewed with the patient. Ample time for discussion and questions allowed. The patient understood, was satisfied, and agreed to proceed.     HPI: Lisa Briggs is a 63 y.o. female who presents for EGD for evaluation of GERD, dysphagia, regurgitation, and epigastric pain. Hx of GIST s/p resection 2016.   Also for colonsocopy for CRC screening. Fhx notable for [facility] with CRC <age 84.    02/11/2019 colonoscopy Dr. Teressa for screening with family history of first-degree relative with colorectal cancer before age 24 showed diverticulosis otherwise unremarkable recall 5 years   Past Medical History:  Diagnosis Date   Allergy    Anemia    iron therapy for years as of 10/12; normal Hgb 08/2013   Anxiety    Arthritis    Cardiomyopathy (HCC)    Chest pain 04/05/2011   cardiac eval, normal treadmill stress test, Dr. 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 Lynda Leos, [facility]   GERD (gastroesophageal reflux disease)    History of uterine fibroid    Hyperlipidemia    Hypertension    Paresthesia 09/2014   initially thought to be TIA, neurology consult in 12/2014 with other non TIA considerations.     Polyarthralgia    normal rheumatoid screen 01/2012   TIA (transient ischemic attack) 2015/16    Past Surgical History:  Procedure Laterality Date    COLONOSCOPY  01/2014   diverticulosis, othwerise normal - Dr. Toribio Teressa   DILATATION & CURETTAGE/HYSTEROSCOPY WITH MYOSURE N/A 07/14/2021   Procedure: DILATATION & CURETTAGE/HYSTEROSCOPY WITH MYOSURE Reach;  Surgeon: Barbette Knock, MD;  Location: [facility];  Service: Gynecology;  Laterality: N/A;   ESOPHAGOGASTRODUODENOSCOPY  2013   Dr. Rollin, gastritis   ESOPHAGOGASTRODUODENOSCOPY  [id number]   EUS N/A 04/16/2014   Procedure: UPPER ENDOSCOPIC ULTRASOUND (EUS) LINEAR;  Surgeon: Toribio SHAUNNA Teressa, MD;  Location: [facility];  Service: Endoscopy;  Laterality: N/A;   gall stone surgery     KNEE ARTHROSCOPY Left    LAPAROSCOPIC GASTRIC RESECTION N/A 06/09/2014   Procedure: LAPAROSCOPIC GASTRIC MASS RESECTION;  Surgeon: Lynda Leos, MD;  Location: [facility];  Service: General;  Laterality: N/A;   LIPOMA EXCISION     forehead   LUMBAR LAMINECTOMY N/A 04/25/2019   Procedure: LEFT L2-3 MICRODISCECTOMY, BILATERAL L5-S1 PARTIAL HEMILAMINECTOMY;  Surgeon: Lucilla Lynwood BRAVO, MD;  Location: [facility];  Service: Orthopedics;  Laterality: N/A;   UPPER GASTROINTESTINAL ENDOSCOPY     UTERINE FIBROID SURGERY      Prior to Admission medications   Medication Sig Start Date End Date Taking? Authorizing Provider  albuterol (VENTOLIN HFA) 108 (90 Base) MCG/ACT inhaler SMARTSIG:2 Puff(s) By Mouth Every 6 Hours PRN 08/24/22  Yes [provider]  blood glucose meter kit and supplies KIT Test once daily-DX-E11.69 08/05/20  Yes Tysinger, Alm RAMAN, PA-C  carvedilol  (COREG ) 6.25 MG  tablet Take 1 tablet (6.25 mg total) by mouth 2 (two) times daily. 02/01/21  Yes Tysinger, Alm RAMAN, PA-C  Cholecalciferol (VITAMIN D3) 125 MCG (5000 UT) CAPS Take 2,000 Units by mouth daily.   Yes [provider]  dexlansoprazole  (DEXILANT ) 60 MG capsule Take 1 capsule (60 mg total) by mouth daily. 12/07/23  Yes May, Deanna J, NP  ENTRESTO  49-51 MG Take 1 tablet by mouth 2 (two) times daily. 05/23/21  Yes [provider]  EQ ASPIRIN  ADULT LOW DOSE 81 MG EC tablet Take 1 tablet by mouth once daily 06/03/21  Yes Tysinger, Alm RAMAN, PA-C  FARXIGA  10 MG TABS tablet Take 10 mg by mouth every morning. 12/11/22  Yes [provider]  gabapentin  (NEURONTIN ) 300 MG capsule TAKE 1 CAPSULE BY MOUTH THREE TIMES DAILY 06/27/21  Yes Tysinger, Alm RAMAN, PA-C  Glucose Blood (BLOOD GLUCOSE TEST STRIPS) STRP Inject 1 each as directed daily. 09/10/20  Yes Tysinger, Alm RAMAN, PA-C  hydroxychloroquine  (PLAQUENIL ) 200 MG tablet Take 1 tablet by mouth twice daily 01/07/24  Yes Cheryl Waddell HERO, PA-C  latanoprost (XALATAN) 0.005 % ophthalmic solution Place 1 drop into both eyes at bedtime.   Yes [provider]  LINZESS  290 MCG CAPS capsule TAKE 1 CAPSULE BY MOUTH ONCE DAILY BEFORE BREAKFAST 04/24/23  Yes Craig Palma R, PA-C  ondansetron  (ZOFRAN ) 4 MG tablet TAKE 1 TABLET BY MOUTH EVERY 8 HOURS AS NEEDED FOR NAUSEA OR VOMITING 02/23/21  Yes Tysinger, Alm RAMAN, PA-C  potassium chloride  (KLOR-CON ) 10 MEQ tablet Take 1 tablet by mouth twice daily 08/05/21  Yes Tysinger, Alm RAMAN, PA-C  RESTASIS 0.05 % ophthalmic emulsion 1 drop 2 (two) times daily. 10/19/21  Yes [provider]  spironolactone  (ALDACTONE ) 25 MG tablet Take 1 tablet by mouth once daily 01/07/24  Yes Tolia, Sunit, DO  TURMERIC PO Take 500 mg by mouth.   Yes [provider]  venlafaxine  XR (EFFEXOR -XR) 37.5 MG 24 hr capsule TAKE 1 CAPSULE DAILY WITH BREAKFAST. 09/09/21  Yes Tysinger, Alm RAMAN, PA-C  Cyanocobalamin  (VITAMIN B-12 PO) Take by mouth daily. Patient not taking: Reported on 01/09/2024    [provider]  diclofenac  Sodium (VOLTAREN ) 1 % GEL Apply 2-4 grams to affected joint 4 times daily as needed. 02/14/23   Dolphus Reiter, MD  docusate sodium  (COLACE) 100 MG capsule Take 100 mg by mouth daily.    [provider]  fluticasone  (FLONASE ) 50 MCG/ACT nasal spray Place 2 sprays into both nostrils in the morning and at  bedtime. 09/09/21   Tysinger, Alm RAMAN, PA-C  Insulin Pen Needle (BD PEN NEEDLE NANO U/F) 32G X 4 MM MISC 1 each by Does not apply route at bedtime. Patient not taking: Reported on 01/09/2024 07/20/20   Tysinger, Alm RAMAN, PA-C  lidocaine  (LIDODERM ) 5 % Place 1 patch onto the skin daily. Remove & Discard patch within 12 hours or as directed by MD 07/24/23   Dolphus Reiter, MD  Multiple Vitamin (MULTIVITAMIN PO) Take by mouth daily.    [provider]  Omega-3 Fatty Acids (OMEGA-3 FISH OIL PO) Take 690 mg by mouth daily.    [provider]  pyridOXINE (VITAMIN B6) 100 MG tablet Take 100 mg by mouth daily. Patient not taking: Reported on 01/09/2024    [provider]  rosuvastatin  (CRESTOR ) 20 MG tablet Take 1 tablet (20 mg total) by mouth daily. 02/01/21 12/05/23  Tysinger, Alm RAMAN, PA-C  Vitamin A 3 MG (10000 UT) TABS Take  by mouth.    [provider]    Current Outpatient Medications  Medication Sig Dispense Refill   albuterol (VENTOLIN HFA) 108 (90 Base) MCG/ACT inhaler SMARTSIG:2 Puff(s) By Mouth Every 6 Hours PRN     blood glucose meter kit and supplies KIT Test once daily-DX-E11.69 1 each 0   carvedilol  (COREG ) 6.25 MG tablet Take 1 tablet (6.25 mg total) by mouth 2 (two) times daily. 180 tablet 3   Cholecalciferol (VITAMIN D3) 125 MCG (5000 UT) CAPS Take 2,000 Units by mouth daily.     dexlansoprazole  (DEXILANT ) 60 MG capsule Take 1 capsule (60 mg total) by mouth daily. 90 capsule 3   ENTRESTO  49-51 MG Take 1 tablet by mouth 2 (two) times daily.     EQ ASPIRIN  ADULT LOW DOSE 81 MG EC tablet Take 1 tablet by mouth once daily 90 tablet 0   FARXIGA  10 MG TABS tablet Take 10 mg by mouth every morning.     gabapentin  (NEURONTIN ) 300 MG capsule TAKE 1 CAPSULE BY MOUTH THREE TIMES DAILY 90 capsule 5   Glucose Blood (BLOOD GLUCOSE TEST STRIPS) STRP Inject 1 each as directed daily. 100 strip 5   hydroxychloroquine  (PLAQUENIL ) 200 MG tablet Take 1 tablet by mouth twice  daily 60 tablet 0   latanoprost (XALATAN) 0.005 % ophthalmic solution Place 1 drop into both eyes at bedtime.     LINZESS  290 MCG CAPS capsule TAKE 1 CAPSULE BY MOUTH ONCE DAILY BEFORE BREAKFAST 90 capsule 0   ondansetron  (ZOFRAN ) 4 MG tablet TAKE 1 TABLET BY MOUTH EVERY 8 HOURS AS NEEDED FOR NAUSEA OR VOMITING 90 tablet 0   potassium chloride  (KLOR-CON ) 10 MEQ tablet Take 1 tablet by mouth twice daily 180 tablet 0   RESTASIS 0.05 % ophthalmic emulsion 1 drop 2 (two) times daily.     spironolactone  (ALDACTONE ) 25 MG tablet Take 1 tablet by mouth once daily 30 tablet 0   TURMERIC PO Take 500 mg by mouth.     venlafaxine  XR (EFFEXOR -XR) 37.5 MG 24 hr capsule TAKE 1 CAPSULE DAILY WITH BREAKFAST. 90 capsule 0   Cyanocobalamin  (VITAMIN B-12 PO) Take by mouth daily. (Patient not taking: Reported on 01/09/2024)     diclofenac  Sodium (VOLTAREN ) 1 % GEL Apply 2-4 grams to affected joint 4 times daily as needed. 400 g 2   docusate sodium  (COLACE) 100 MG capsule Take 100 mg by mouth daily.     fluticasone  (FLONASE ) 50 MCG/ACT nasal spray Place 2 sprays into both nostrils in the morning and at bedtime. 16 mL 2   Insulin Pen Needle (BD PEN NEEDLE NANO U/F) 32G X 4 MM MISC 1 each by Does not apply route at bedtime. (Patient not taking: Reported on 01/09/2024) 100 each 11   lidocaine  (LIDODERM ) 5 % Place 1 patch onto the skin daily. Remove & Discard patch within 12 hours or as directed by MD 15 patch 0   Multiple Vitamin (MULTIVITAMIN PO) Take by mouth daily.     Omega-3 Fatty Acids (OMEGA-3 FISH OIL PO) Take 690 mg by mouth daily.     pyridOXINE (VITAMIN B6) 100 MG tablet Take 100 mg by mouth daily. (Patient not taking: Reported on 01/09/2024)     rosuvastatin  (CRESTOR ) 20 MG tablet Take 1 tablet (20 mg total) by mouth daily. 90 tablet 3   Vitamin A 3 MG (10000 UT) TABS Take by mouth.     Current Facility-Administered Medications  Medication Dose Route Frequency Provider Last Rate Last Admin  0.9 %  sodium  chloride infusion  500 mL Intravenous Once Myrissa Chipley V, DO        Allergies as of 01/09/2024 - Review Complete 01/09/2024  Allergen Reaction Noted   Kiwi extract Hives and Itching 09/27/2015   Mucinex dm [dm-guaifenesin er] Nausea Only 01/21/2015   Peach [prunus persica] Itching 09/27/2015    Family History  Problem Relation Age of Onset   Hypertension Mother    Arthritis Mother    GER disease Mother    Glaucoma Mother    Breast cancer Mother 59   Stroke Father    Breast cancer Sister 65   Hypertension Sister    Leukemia Sister    Hypertension Sister    Colon cancer Brother 94   Diabetes Brother    Diabetes Maternal Aunt    Heart disease Maternal Aunt    Heart disease Maternal Uncle    Stroke Maternal Uncle    Heart disease Maternal Grandmother    Heart disease Maternal Grandfather    Healthy Son    Colon polyps Neg Hx    Esophageal cancer Neg Hx    Stomach cancer Neg Hx    Rectal cancer Neg Hx     Social History   Socioeconomic History   Marital status: Married    Spouse name: Not on file   Number of children: 1   Years of education: Not on file   Highest education level: Not on file  Occupational History   Occupation: Chief of Staff: [employer]  Tobacco Use   Smoking status: Never    Passive exposure: Current   Smokeless tobacco: Never  Vaping Use   Vaping status: Never Used  Substance and Sexual Activity   Alcohol use: No    Alcohol/week: 0.0 standard drinks of alcohol   Drug use: No   Sexual activity: Not on file  Other Topics Concern   Not on file  Social History Narrative   Married, has 1 son in [place]  and some grandchildren.  Location manager.  Active on job.  Does stretching and exercises daily as per physical therapy.  Works 12 hours daily.     Social Drivers of Corporate investment banker Strain: Not on file  Food Insecurity: Not on file  Transportation Needs: Not on file  Physical Activity: Not on file  Stress: Not  on file  Social Connections: Not on file  Intimate Partner Violence: Not on file    Physical Exam: Vital signs in last 24 hours: @BP  132/65   Pulse 60   Temp (!) 97.2 F (36.2 C) (Temporal)   Ht 5' 7.5 (1.715 m)   Wt 242 lb (109.8 kg)   LMP 02/23/2015 (LMP Unknown)   SpO2 96%   BMI 37.34 kg/m  GEN: NAD EYE: Sclerae anicteric ENT: MMM CV: Non-tachycardic Pulm: CTA b/l GI: Soft, NT/ND NEURO:  Alert & Oriented x 3   Sandor Flatter, DO [facility]   01/09/2024 8:31 AM

## 2024-01-09 NOTE — Patient Instructions (Addendum)
 Handouts given: Diverticulosis, Hemorrhoids Resume previous diet. Continue present medications.  Await pathology results. Repeat colonoscopy in 5 years for screening purposes.  YOU HAD AN ENDOSCOPIC PROCEDURE TODAY AT THE [facility]:   Refer to the procedure report that was given to you for any specific questions about what was found during the examination.  If the procedure report does not answer your questions, please call your gastroenterologist to clarify.  If you requested that your care partner not be given the details of your procedure findings, then the procedure report has been included in a sealed envelope for you to review at your convenience later.  YOU SHOULD EXPECT: Some feelings of bloating in the abdomen. Passage of more gas than usual.  Walking can help get rid of the air that was put into your GI tract during the procedure and reduce the bloating. If you had a lower endoscopy (such as a colonoscopy or flexible sigmoidoscopy) you may notice spotting of blood in your stool or on the toilet paper. If you underwent a bowel prep for your procedure, you may not have a normal bowel movement for a few days.  Please Note:  You might notice some irritation and congestion in your nose or some drainage.  This is from the oxygen used during your procedure.  There is no need for concern and it should clear up in a day or so.  SYMPTOMS TO REPORT IMMEDIATELY:  Following lower endoscopy (colonoscopy or flexible sigmoidoscopy):  Excessive amounts of blood in the stool  Significant tenderness or worsening of abdominal pains  Swelling of the abdomen that is new, acute  Fever of 100F or higher  Following upper endoscopy (EGD)  Vomiting of blood or coffee ground material  New chest pain or pain under the shoulder blades  Painful or persistently difficult swallowing  New shortness of breath  Fever of 100F or higher  Black, tarry-looking stools  For urgent or emergent issues, a  gastroenterologist can be reached at any hour by calling [phone]. Do not use MyChart messaging for urgent concerns.    DIET:  We do recommend a small meal at first, but then you may proceed to your regular diet.  Drink plenty of fluids but you should avoid alcoholic beverages for 24 hours.  ACTIVITY:  You should plan to take it easy for the rest of today and you should NOT DRIVE or use heavy machinery until tomorrow (because of the sedation medicines used during the test).    FOLLOW UP: Our staff will call the number listed on your records the next business day following your procedure.  We will call around 7:15- 8:00 am to check on you and address any questions or concerns that you may have regarding the information given to you following your procedure. If we do not reach you, we will leave a message.     If any biopsies were taken you will be contacted by phone or by letter within the next 1-3 weeks.  Please call us  at [phone] if you have not heard about the biopsies in 3 weeks.    SIGNATURES/CONFIDENTIALITY: You and/or your care partner have signed paperwork which will be entered into your electronic medical record.  These signatures attest to the fact that that the information above on your After Visit Summary has been reviewed and is understood.  Full responsibility of the confidentiality of this discharge information lies with you and/or your care-partner.

## 2024-01-10 ENCOUNTER — Telehealth: Payer: Self-pay

## 2024-01-10 NOTE — Telephone Encounter (Signed)
 No answer on follow-up call. Left VM for pt.

## 2024-01-11 LAB — SURGICAL PATHOLOGY

## 2024-01-14 ENCOUNTER — Ambulatory Visit: Payer: Self-pay | Admitting: Gastroenterology

## 2024-01-23 ENCOUNTER — Ambulatory Visit: Attending: Physician Assistant | Admitting: Physician Assistant

## 2024-01-29 ENCOUNTER — Ambulatory Visit: Attending: Physician Assistant | Admitting: Physician Assistant

## 2024-01-29 ENCOUNTER — Encounter: Payer: Self-pay | Admitting: Physician Assistant

## 2024-01-29 VITALS — BP 130/79 | HR 65 | Temp 98.2°F | Resp 16 | Ht 68.0 in | Wt 246.0 lb

## 2024-01-29 DIAGNOSIS — M0579 Rheumatoid arthritis with rheumatoid factor of multiple sites without organ or systems involvement: Secondary | ICD-10-CM

## 2024-01-29 DIAGNOSIS — S82892D Other fracture of left lower leg, subsequent encounter for closed fracture with routine healing: Secondary | ICD-10-CM

## 2024-01-29 DIAGNOSIS — M19041 Primary osteoarthritis, right hand: Secondary | ICD-10-CM

## 2024-01-29 DIAGNOSIS — M19072 Primary osteoarthritis, left ankle and foot: Secondary | ICD-10-CM

## 2024-01-29 DIAGNOSIS — M25361 Other instability, right knee: Secondary | ICD-10-CM

## 2024-01-29 DIAGNOSIS — M85852 Other specified disorders of bone density and structure, left thigh: Secondary | ICD-10-CM

## 2024-01-29 DIAGNOSIS — Z8639 Personal history of other endocrine, nutritional and metabolic disease: Secondary | ICD-10-CM

## 2024-01-29 DIAGNOSIS — K219 Gastro-esophageal reflux disease without esophagitis: Secondary | ICD-10-CM

## 2024-01-29 DIAGNOSIS — Z79899 Other long term (current) drug therapy: Secondary | ICD-10-CM | POA: Diagnosis not present

## 2024-01-29 DIAGNOSIS — M19071 Primary osteoarthritis, right ankle and foot: Secondary | ICD-10-CM

## 2024-01-29 DIAGNOSIS — E559 Vitamin D deficiency, unspecified: Secondary | ICD-10-CM

## 2024-01-29 DIAGNOSIS — Z8673 Personal history of transient ischemic attack (TIA), and cerebral infarction without residual deficits: Secondary | ICD-10-CM

## 2024-01-29 DIAGNOSIS — M5030 Other cervical disc degeneration, unspecified cervical region: Secondary | ICD-10-CM

## 2024-01-29 DIAGNOSIS — M170 Bilateral primary osteoarthritis of knee: Secondary | ICD-10-CM | POA: Diagnosis not present

## 2024-01-29 DIAGNOSIS — M47816 Spondylosis without myelopathy or radiculopathy, lumbar region: Secondary | ICD-10-CM

## 2024-01-29 DIAGNOSIS — M19042 Primary osteoarthritis, left hand: Secondary | ICD-10-CM

## 2024-01-29 DIAGNOSIS — I10 Essential (primary) hypertension: Secondary | ICD-10-CM

## 2024-01-29 LAB — COMPREHENSIVE METABOLIC PANEL WITH GFR
AG Ratio: 1.5 (calc) (ref 1.0–2.5)
ALT: 16 U/L (ref 6–29)
AST: 17 U/L (ref 10–35)
Albumin: 4.1 g/dL (ref 3.6–5.1)
Alkaline phosphatase (APISO): 105 U/L (ref 37–153)
BUN: 12 mg/dL (ref 7–25)
CO2: 24 mmol/L (ref 20–32)
Calcium: 9.3 mg/dL (ref 8.6–10.4)
Chloride: 109 mmol/L (ref 98–110)
Creat: 1.03 mg/dL (ref 0.50–1.05)
Globulin: 2.7 g/dL (ref 1.9–3.7)
Glucose, Bld: 82 mg/dL (ref 65–99)
Potassium: 4.2 mmol/L (ref 3.5–5.3)
Sodium: 143 mmol/L (ref 135–146)
Total Bilirubin: 0.4 mg/dL (ref 0.2–1.2)
Total Protein: 6.8 g/dL (ref 6.1–8.1)
eGFR: 62 mL/min/1.73m2 (ref >=60)

## 2024-01-29 LAB — CBC WITH DIFFERENTIAL/PLATELET
Absolute Lymphocytes: 1866 {cells}/uL (ref 850–3900)
Absolute Monocytes: 408 {cells}/uL (ref 200–950)
Basophils Absolute: 21 {cells}/uL (ref 0–200)
Basophils Relative: 0.4 %
Eosinophils Absolute: 48 {cells}/uL (ref 15–500)
Eosinophils Relative: 0.9 %
HCT: 41.6 % (ref 35.0–45.0)
Hemoglobin: 12.9 g/dL (ref 11.7–15.5)
MCH: 27.8 pg (ref 27.0–33.0)
MCHC: 31 g/dL — ABNORMAL LOW (ref 32.0–36.0)
MCV: 89.7 fL (ref 80.0–100.0)
MPV: 10.2 fL (ref 7.5–12.5)
Monocytes Relative: 7.7 %
Neutro Abs: 2957 {cells}/uL (ref 1500–7800)
Neutrophils Relative %: 55.8 %
Platelets: 320 Thousand/uL (ref 140–400)
RBC: 4.64 Million/uL (ref 3.80–5.10)
RDW: 11.9 % (ref 11.0–15.0)
Total Lymphocyte: 35.2 %
WBC: 5.3 Thousand/uL (ref 3.8–10.8)

## 2024-01-29 MED ORDER — HYDROXYCHLOROQUINE SULFATE 200 MG PO TABS
200.0000 mg | ORAL_TABLET | Freq: Two times a day (BID) | ORAL | 0 refills | Status: AC
Start: 2024-01-29 — End: ?

## 2024-01-29 NOTE — Progress Notes (Signed)
 Office Visit Note  [name]             Date of Birth: [birth year]           MRN: [id number]             PCP: Maree Leni Edyth DELENA, MD Referring: Maree Leni Edyth DELENA, MD Visit Date: 01/29/2024 Occupation: Data Unavailable  Subjective:  Medication monitoring  History of Present Illness: Lisa Briggs is a 63 y.o. female with history of rheumatoid arthritis.  Patient remains on plaquenil  200 mg 1 tablet by mouth twice daily.  She is tolerating Plaquenil  without any side effects and has not had any gaps in therapy.  Patient continues to experience intermittent pain and stiffness in the right knee.  She has been using a right knee brace for support.  Patient had good results after a right knee joint cortisone injection but is planning to proceed with viscosupplementation in the future.  She is not ready to proceed with a right knee replacement.  Patient experiences intermittent discomfort in the left wrist with repetitive or overuse activities.  She denies any tenderness or joint swelling currently.   Activities of Daily Living:  Patient reports morning stiffness for 12-24 hours.   Patient Reports nocturnal pain.  Difficulty dressing/grooming: Reports Difficulty climbing stairs: Reports Difficulty getting out of chair: Reports Difficulty using hands for taps, buttons, cutlery, and/or writing: Reports  Review of Systems  Constitutional:  Negative for fatigue.  HENT:  Negative for mouth sores and mouth dryness.   Eyes:  Positive for dryness.  Respiratory:  Negative for shortness of breath.   Cardiovascular:  Positive for chest pain and palpitations.  Gastrointestinal:  Positive for constipation. Negative for blood in stool and diarrhea.  Endocrine: Negative for increased urination.  Genitourinary:  Negative for involuntary urination.  Musculoskeletal:  Positive for joint pain, gait problem, joint pain, joint swelling, myalgias, muscle weakness, morning stiffness, muscle  tenderness and myalgias.  Skin:  Negative for color change, rash, hair loss and sensitivity to sunlight.  Allergic/Immunologic: Positive for susceptible to infections.  Neurological:  Positive for dizziness. Negative for headaches.  Hematological:  Negative for swollen glands.  Psychiatric/Behavioral:  Negative for depressed mood and sleep disturbance. The patient is not nervous/anxious.     PMFS History:  Patient Active Problem List   Diagnosis Date Noted   Unilateral primary osteoarthritis, right knee 05/04/2022   Bilateral ankle pain 09/15/2021   Chronic HFrEF (heart failure with reduced ejection fraction) (HCC) 07/29/2021   Advance directive discussed with patient 01/27/2021   Medicare annual wellness visit, subsequent 01/27/2021   Needs flu shot 01/27/2021   At moderate risk for fall 01/27/2021   Prediabetes 02/19/2020   Menopause 11/25/2019   Cardiomyopathy (HCC) 11/19/2019   Rheumatoid arthritis involving multiple sites with positive rheumatoid factor (HCC) 11/19/2019   Primary osteoarthritis involving multiple joints 11/19/2019   Greater trochanteric bursitis, right 11/19/2019   Decreased activities of daily living (ADL) 11/19/2019   Gait disturbance 11/19/2019   Use of cane as ambulatory aid 11/19/2019   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    Class: Chronic   Spinal stenosis of lumbar region 04/25/2019    Class: Chronic   Status post lumbar laminectomy 04/25/2019   Chronic pain of right knee 09/13/2018   Chronic pain of left knee 07/08/2018   Chronic hip pain 07/08/2018   Edema 07/08/2018   Need for shingles vaccine 12/05/2017   Primary osteoarthritis of both hands  11/23/2017   Primary osteoarthritis of both knees 11/23/2017   Primary osteoarthritis of both feet 11/23/2017   DDD (degenerative disc disease), lumbar 11/23/2017   Vaccine counseling 08/13/2017   Sensitive skin 01/23/2017   Need for influenza vaccination 01/23/2017   History of  gastrointestinal stromal tumor (GIST) 04/20/2016   History of TIA (transient ischemic attack) 04/20/2016   Screening for breast cancer 04/20/2016   Estrogen deficiency 04/20/2016   Impaired fasting blood sugar 04/20/2016   Constipation 04/20/2016   History of fall 04/20/2016   Chronic radicular lumbar pain 01/27/2016   Encounter for health maintenance examination in adult 03/22/2015   Paresthesia 03/22/2015   Cognitive decline 03/22/2015   Screening for cervical cancer 03/22/2015   Vitamin D  deficiency 03/22/2015   History of uterine leiomyoma 03/22/2015   Gastroesophageal reflux disease without esophagitis 03/02/2014   Chronic nausea 03/02/2014   Rhinitis, allergic 03/02/2014   Essential hypertension 03/02/2014   Hyperlipidemia 03/02/2014   Obesity with serious comorbidity 01/16/2012    Past Medical History:  Diagnosis Date   Allergy    Anemia    iron therapy for years as of 10/12; normal Hgb 08/2013   Anxiety    Arthritis    Cardiomyopathy (HCC)    Chest pain 04/05/2011   cardiac eval, normal treadmill stress test, Dr. 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 Lynda Leos, [facility]   GERD (gastroesophageal reflux disease)    History of uterine fibroid    Hyperlipidemia    Hypertension    Paresthesia 09/2014   initially thought to be TIA, neurology consult in 12/2014 with other non TIA considerations.     Polyarthralgia    normal rheumatoid screen 01/2012   TIA (transient ischemic attack) 2015/16    Family History  Problem Relation Age of Onset   Hypertension Mother    Arthritis Mother    GER disease Mother    Glaucoma Mother    Breast cancer Mother 85   Stroke Father    Breast cancer Sister 14   Hypertension Sister    Leukemia Sister    Hypertension Sister    Colon cancer Brother  63   Diabetes Brother    Diabetes Maternal Aunt    Heart disease Maternal Aunt    Heart disease Maternal Uncle    Stroke Maternal Uncle    Heart disease Maternal Grandmother    Heart disease Maternal Grandfather    Healthy Son    Colon polyps Neg Hx    Esophageal cancer Neg Hx    Stomach cancer Neg Hx    Rectal cancer Neg Hx    Past Surgical History:  Procedure Laterality Date   COLONOSCOPY  01/2014   diverticulosis, othwerise normal - Dr. Toribio Cedar   DILATATION & CURETTAGE/HYSTEROSCOPY WITH MYOSURE N/A 07/14/2021   Procedure: DILATATION & CURETTAGE/HYSTEROSCOPY WITH MYOSURE Reach;  Surgeon: Barbette Knock, MD;  Location: [facility];  Service: Gynecology;  Laterality: N/A;   ESOPHAGOGASTRODUODENOSCOPY  2013   Dr. Rollin, gastritis   ESOPHAGOGASTRODUODENOSCOPY  [id number]   EUS N/A 04/16/2014   Procedure: UPPER ENDOSCOPIC ULTRASOUND (EUS) LINEAR;  Surgeon: Toribio SHAUNNA Cedar, MD;  Location: [facility];  Service: Endoscopy;  Laterality: N/A;   gall stone surgery     KNEE ARTHROSCOPY Left  LAPAROSCOPIC GASTRIC RESECTION N/A 06/09/2014   Procedure: LAPAROSCOPIC GASTRIC MASS RESECTION;  Surgeon: Lynda Leos, MD;  Location: [facility];  Service: General;  Laterality: N/A;   LIPOMA EXCISION     forehead   LUMBAR LAMINECTOMY N/A 04/25/2019   Procedure: LEFT L2-3 MICRODISCECTOMY, BILATERAL L5-S1 PARTIAL HEMILAMINECTOMY;  Surgeon: Lucilla Lynwood BRAVO, MD;  Location: [facility];  Service: Orthopedics;  Laterality: N/A;   UPPER GASTROINTESTINAL ENDOSCOPY     UTERINE FIBROID SURGERY     Social History   Tobacco Use   Smoking status: Never    Passive exposure: Current   Smokeless tobacco: Never  Vaping Use   Vaping status: Never Used  Substance Use Topics   Alcohol use: No    Alcohol/week: 0.0 standard drinks of alcohol   Drug use: No   Social History   Social History Narrative   Married, has 1 son in [place]  and some grandchildren.  Location manager.  Active on job.  Does  stretching and exercises daily as per physical therapy.  Works 12 hours daily.       Immunization History  Administered Date(s) Administered   Influenza,inj,Quad PF,6+ Mos 11/24/2013, 01/18/2015, 01/23/2017, 12/05/2017, 12/17/2018, 11/19/2019, 01/27/2021   PFIZER Comirnaty(Gray Top)Covid-19 Tri-Sucrose Vaccine 07/12/2020   PFIZER(Purple Top)SARS-COV-2 Vaccination 06/16/2019, 07/08/2019, 01/15/2020   PNEUMOCOCCAL CONJUGATE-20 07/28/2021   Pneumococcal Polysaccharide-23 05/21/2015   Tdap 01/17/2011, 06/25/2021   Zoster Recombinant(Shingrix ) 10/05/2017, 12/07/2017     Objective: Vital Signs: BP 130/79   Pulse 65   Temp 98.2 F (36.8 C)   Resp 16   Ht 5' 8 (1.727 m)   Wt 246 lb (111.6 kg)   LMP 02/23/2015 (LMP Unknown)   BMI 37.40 kg/m    Physical Exam Vitals and nursing note reviewed.  Constitutional:      Appearance: She is well-developed.  HENT:     Head: Normocephalic and atraumatic.  Eyes:     Conjunctiva/sclera: Conjunctivae normal.  Cardiovascular:     Rate and Rhythm: Normal rate and regular rhythm.     Heart sounds: Normal heart sounds.  Pulmonary:     Effort: Pulmonary effort is normal.     Breath sounds: Normal breath sounds.  Abdominal:     General: Bowel sounds are normal.     Palpations: Abdomen is soft.  Musculoskeletal:     Cervical back: Normal range of motion.  Lymphadenopathy:     Cervical: No cervical adenopathy.  Skin:    General: Skin is warm and dry.     Capillary Refill: Capillary refill takes less than 2 seconds.  Neurological:     Mental Status: She is alert and oriented to person, place, and time.  Psychiatric:        Behavior: Behavior normal.      Musculoskeletal Exam: C-spine has limited range of motion with lateral rotation.  Limited mobility of the lumbar spine.  Shoulder joints have good range of motion with some discomfort and stiffness.  Elbow joints have good range of motion with no tenderness along the joint line.  Wrist  joints have good range of motion with no tenderness or synovitis.  No tenderness or synovitis of MCP joints.  PIP and DIP thickening.  Hip joint stable to assess in seated position.  Right knee is in a brace.  Left knee has good range of motion no warmth or effusion.  No tenderness or swelling of ankle joints.  CDAI Exam: CDAI Score: -- Patient Global: --; Provider Global: -- Swollen: --; Tender: -- Joint  Exam 01/29/2024   No joint exam has been documented for this visit   There is currently no information documented on the homunculus. Go to the Rheumatology activity and complete the homunculus joint exam.  Investigation: No additional findings.  Imaging: No results found.  Recent Labs: Lab Results  Component Value Date   WBC 5.6 07/24/2023   HGB 12.9 07/24/2023   PLT 318 07/24/2023   NA 143 07/24/2023   K 4.2 07/24/2023   CL 107 07/24/2023   CO2 31 07/24/2023   GLUCOSE 78 07/24/2023   BUN 14 07/24/2023   CREATININE 0.90 07/24/2023   BILITOT 0.3 07/24/2023   ALKPHOS 110 01/24/2023   AST 18 07/24/2023   ALT 17 07/24/2023   PROT 6.6 07/24/2023   ALBUMIN 4.0 01/24/2023   CALCIUM  9.2 07/24/2023   GFRAA 115 05/11/2020    Speciality Comments: PLQ Eye Exam: 11/05/2023 WNL @ [facility] Follow up in 1 year  Procedures:  No procedures performed Allergies: Kiwi extract, Mucinex dm [dm-guaifenesin er], and Prunus persica   Assessment / Plan:     Visit Diagnoses: Rheumatoid arthritis involving multiple sites with positive rheumatoid factor (HCC) - +RF, +CCP: She has no synovitis on examination today.  She has not had any signs or symptoms of a rheumatoid arthritis flare.  She has clinically been doing well taking Plaquenil  200 mg 1 tablet by mouth twice daily.  She is tolerating Plaquenil  without any side effects and has not had any gaps in therapy.  No medication changes will be made at this time.  A refill of Plaquenil  was sent to the pharmacy today.  She was advised to notify  us  if she develops signs or symptoms of a flare.  She will follow-up in the office in 5 months or sooner if needed.- Plan: hydroxychloroquine  (PLAQUENIL ) 200 MG tablet  High risk medication use - Plaquenil  200 mg 1 tablet by mouth twice daily.  CBC and CMP updated on 07/24/23.  Orders for CBC and CMP released today.  PLQ Eye Exam: 11/05/2023 WNL @ [facility] Follow up in 1 year   - Plan: CBC with Differential/Platelet, Comprehensive metabolic panel with GFR  Primary osteoarthritis of both hands: She has PIP and DIP thickening consistent with osteoarthritis of both hands.  Complete fist formation.  No synovitis noted.  She experiences intermittent discomfort in the left wrist which she attributes to repetitive or overuse activities.  She no tenderness upon palpation today.  Primary osteoarthritis of both knees: Patient continues to experience intermittent pain and stiffness especially in the right knee.  She uses a right knee brace as needed.  Patient is planning to proceed with viscosupplementation in the future.  She is not ready to proceed with knee replacements at this time.  She plans on continuing to use a cane or rollator walker to assist with ambulation.  Instability of right knee joint: Patient has been wearing a knee brace for support.  She uses either a rollator walker or cane to assist with ambulation.  Good response to cortisone injections in the past.  She is planning to proceed with viscosupplementation once approved by insurance.  She is not yet ready to proceed with a right knee replacement.  Primary osteoarthritis of both feet: She is not experiencing any increased discomfort in her feet at this time.  She is good range of motion of both ankle joints with no tenderness or joint swelling.  DDD (degenerative disc disease), cervical: C-spine has limited range of motion with  lateral rotation.  Spondylosis of lumbar spine: Lumbar laminectomy performed on 04/25/2019.  Patient uses a cane  or rollator walker to assist with ambulation.  Osteopenia of left hip - DEXA updated on 08/05/21: The BMD measured at Femur Neck Left is 0.868 g/cm2 with a T-score of -1.2.  She is taking vitamin D  2000 units daily. DEXA is overdue.  Vitamin D  deficiency: She is taking vitamin D  2000 units daily.  History of hyperlipidemia  History of TIA (transient ischemic attack)  Essential hypertension: Blood pressure was 130/79 today in the office.  Gastroesophageal reflux disease without esophagitis  Closed avulsion fracture of left ankle with routine healing, subsequent encounter   Orders: Orders Placed This Encounter  Procedures   CBC with Differential/Platelet   Comprehensive metabolic panel with GFR   Meds ordered this encounter  Medications   hydroxychloroquine  (PLAQUENIL ) 200 MG tablet    Sig: Take 1 tablet (200 mg total) by mouth 2 (two) times daily.    Dispense:  180 tablet    Refill:  0    Follow-Up Instructions: Return in about 5 months (around 06/28/2024) for Rheumatoid arthritis.   Lisa CHRISTELLA Craze, PA-C  Note - This record has been created using Dragon software.  Chart creation errors have been sought, but may not always  have been located. Such creation errors do not reflect on  the standard of medical care.

## 2024-01-30 ENCOUNTER — Ambulatory Visit: Payer: Self-pay | Admitting: Physician Assistant

## 2024-01-30 NOTE — Progress Notes (Signed)
 CBC and CMP WNL

## 2024-03-04 ENCOUNTER — Other Ambulatory Visit: Payer: Self-pay | Admitting: Cardiology

## 2024-03-04 DIAGNOSIS — I5022 Chronic systolic (congestive) heart failure: Secondary | ICD-10-CM

## 2024-03-04 DIAGNOSIS — I429 Cardiomyopathy, unspecified: Secondary | ICD-10-CM

## 2024-07-01 ENCOUNTER — Ambulatory Visit: Admitting: Rheumatology
